# Patient Record
Sex: Male | Born: 1965
Health system: Southern US, Community
[De-identification: ages and names within clinical notes are randomized; demographics above are authoritative.]

## PROBLEM LIST (undated history)

## (undated) DIAGNOSIS — E119 Type 2 diabetes mellitus without complications: Secondary | ICD-10-CM

## (undated) DIAGNOSIS — E785 Hyperlipidemia, unspecified: Secondary | ICD-10-CM

## (undated) DIAGNOSIS — N183 Chronic kidney disease, stage 3 unspecified: Secondary | ICD-10-CM

## (undated) DIAGNOSIS — I5022 Chronic systolic (congestive) heart failure: Secondary | ICD-10-CM

## (undated) DIAGNOSIS — I2510 Atherosclerotic heart disease of native coronary artery without angina pectoris: Secondary | ICD-10-CM

## (undated) DIAGNOSIS — I119 Hypertensive heart disease without heart failure: Secondary | ICD-10-CM

## (undated) DIAGNOSIS — I255 Ischemic cardiomyopathy: Secondary | ICD-10-CM

## (undated) HISTORY — PX: HEART TRANSPLANT: SHX268

## (undated) HISTORY — DX: Hyperlipidemia, unspecified: E78.5

## (undated) HISTORY — PX: CARDIAC CATHETERIZATION: SHX172

## (undated) HISTORY — PX: PACEMAKER GENERATOR CHANGE: SHX5998

## (undated) HISTORY — PX: INSERT / REPLACE / REMOVE PACEMAKER: SUR710

## (undated) HISTORY — PX: NEPHRECTOMY TRANSPLANTED ORGAN: SUR880

## (undated) HISTORY — PX: OTHER SURGICAL HISTORY: SHX169

---

## 2014-01-17 DIAGNOSIS — Z23 Encounter for immunization: Secondary | ICD-10-CM | POA: Diagnosis not present

## 2014-01-17 DIAGNOSIS — I10 Essential (primary) hypertension: Secondary | ICD-10-CM | POA: Diagnosis not present

## 2014-01-17 DIAGNOSIS — I2510 Atherosclerotic heart disease of native coronary artery without angina pectoris: Secondary | ICD-10-CM | POA: Diagnosis not present

## 2014-01-17 DIAGNOSIS — M109 Gout, unspecified: Secondary | ICD-10-CM | POA: Diagnosis not present

## 2014-01-17 DIAGNOSIS — E785 Hyperlipidemia, unspecified: Secondary | ICD-10-CM | POA: Diagnosis not present

## 2014-02-17 DIAGNOSIS — R0600 Dyspnea, unspecified: Secondary | ICD-10-CM | POA: Diagnosis not present

## 2014-02-17 DIAGNOSIS — I10 Essential (primary) hypertension: Secondary | ICD-10-CM | POA: Diagnosis not present

## 2014-02-17 DIAGNOSIS — E785 Hyperlipidemia, unspecified: Secondary | ICD-10-CM | POA: Diagnosis not present

## 2014-02-17 DIAGNOSIS — I2510 Atherosclerotic heart disease of native coronary artery without angina pectoris: Secondary | ICD-10-CM | POA: Diagnosis not present

## 2014-02-21 DIAGNOSIS — R079 Chest pain, unspecified: Secondary | ICD-10-CM | POA: Diagnosis not present

## 2014-02-21 DIAGNOSIS — I517 Cardiomegaly: Secondary | ICD-10-CM | POA: Diagnosis not present

## 2014-02-21 DIAGNOSIS — I2510 Atherosclerotic heart disease of native coronary artery without angina pectoris: Secondary | ICD-10-CM | POA: Diagnosis not present

## 2014-02-21 DIAGNOSIS — I10 Essential (primary) hypertension: Secondary | ICD-10-CM | POA: Diagnosis not present

## 2014-02-21 DIAGNOSIS — Z955 Presence of coronary angioplasty implant and graft: Secondary | ICD-10-CM | POA: Diagnosis not present

## 2014-03-04 DIAGNOSIS — J82 Pulmonary eosinophilia, not elsewhere classified: Secondary | ICD-10-CM | POA: Diagnosis not present

## 2014-03-04 DIAGNOSIS — Z955 Presence of coronary angioplasty implant and graft: Secondary | ICD-10-CM | POA: Diagnosis not present

## 2014-03-04 DIAGNOSIS — I2510 Atherosclerotic heart disease of native coronary artery without angina pectoris: Secondary | ICD-10-CM | POA: Diagnosis not present

## 2014-03-04 DIAGNOSIS — Z95810 Presence of automatic (implantable) cardiac defibrillator: Secondary | ICD-10-CM | POA: Diagnosis not present

## 2014-03-04 DIAGNOSIS — I25110 Atherosclerotic heart disease of native coronary artery with unstable angina pectoris: Secondary | ICD-10-CM | POA: Diagnosis not present

## 2014-03-04 DIAGNOSIS — I10 Essential (primary) hypertension: Secondary | ICD-10-CM | POA: Diagnosis not present

## 2014-03-04 DIAGNOSIS — N189 Chronic kidney disease, unspecified: Secondary | ICD-10-CM | POA: Diagnosis not present

## 2014-03-04 DIAGNOSIS — R079 Chest pain, unspecified: Secondary | ICD-10-CM | POA: Diagnosis not present

## 2014-03-04 DIAGNOSIS — I214 Non-ST elevation (NSTEMI) myocardial infarction: Secondary | ICD-10-CM | POA: Diagnosis not present

## 2014-03-04 DIAGNOSIS — I509 Heart failure, unspecified: Secondary | ICD-10-CM | POA: Diagnosis not present

## 2014-03-04 DIAGNOSIS — R778 Other specified abnormalities of plasma proteins: Secondary | ICD-10-CM | POA: Diagnosis not present

## 2014-03-04 DIAGNOSIS — I129 Hypertensive chronic kidney disease with stage 1 through stage 4 chronic kidney disease, or unspecified chronic kidney disease: Secondary | ICD-10-CM | POA: Diagnosis not present

## 2014-03-04 DIAGNOSIS — E785 Hyperlipidemia, unspecified: Secondary | ICD-10-CM | POA: Diagnosis not present

## 2014-03-04 DIAGNOSIS — I252 Old myocardial infarction: Secondary | ICD-10-CM | POA: Diagnosis not present

## 2014-03-07 DIAGNOSIS — I2510 Atherosclerotic heart disease of native coronary artery without angina pectoris: Secondary | ICD-10-CM | POA: Diagnosis not present

## 2014-03-07 DIAGNOSIS — N189 Chronic kidney disease, unspecified: Secondary | ICD-10-CM | POA: Diagnosis not present

## 2014-03-07 DIAGNOSIS — I10 Essential (primary) hypertension: Secondary | ICD-10-CM | POA: Diagnosis not present

## 2014-03-07 DIAGNOSIS — R0600 Dyspnea, unspecified: Secondary | ICD-10-CM | POA: Diagnosis not present

## 2014-04-02 DIAGNOSIS — N189 Chronic kidney disease, unspecified: Secondary | ICD-10-CM | POA: Diagnosis not present

## 2014-04-02 DIAGNOSIS — N179 Acute kidney failure, unspecified: Secondary | ICD-10-CM | POA: Diagnosis not present

## 2014-04-02 DIAGNOSIS — I130 Hypertensive heart and chronic kidney disease with heart failure and stage 1 through stage 4 chronic kidney disease, or unspecified chronic kidney disease: Secondary | ICD-10-CM | POA: Diagnosis not present

## 2014-04-02 DIAGNOSIS — I5023 Acute on chronic systolic (congestive) heart failure: Secondary | ICD-10-CM | POA: Diagnosis not present

## 2014-04-03 DIAGNOSIS — E785 Hyperlipidemia, unspecified: Secondary | ICD-10-CM | POA: Diagnosis present

## 2014-04-03 DIAGNOSIS — S50312A Abrasion of left elbow, initial encounter: Secondary | ICD-10-CM | POA: Diagnosis not present

## 2014-04-03 DIAGNOSIS — I214 Non-ST elevation (NSTEMI) myocardial infarction: Secondary | ICD-10-CM | POA: Diagnosis not present

## 2014-04-03 DIAGNOSIS — Y9289 Other specified places as the place of occurrence of the external cause: Secondary | ICD-10-CM | POA: Diagnosis not present

## 2014-04-03 DIAGNOSIS — X58XXXA Exposure to other specified factors, initial encounter: Secondary | ICD-10-CM | POA: Diagnosis not present

## 2014-04-03 DIAGNOSIS — I5023 Acute on chronic systolic (congestive) heart failure: Secondary | ICD-10-CM | POA: Diagnosis not present

## 2014-04-03 DIAGNOSIS — Y9389 Activity, other specified: Secondary | ICD-10-CM | POA: Diagnosis not present

## 2014-04-03 DIAGNOSIS — N189 Chronic kidney disease, unspecified: Secondary | ICD-10-CM | POA: Diagnosis not present

## 2014-04-03 DIAGNOSIS — N179 Acute kidney failure, unspecified: Secondary | ICD-10-CM | POA: Diagnosis not present

## 2014-04-03 DIAGNOSIS — Z955 Presence of coronary angioplasty implant and graft: Secondary | ICD-10-CM | POA: Diagnosis not present

## 2014-04-03 DIAGNOSIS — N289 Disorder of kidney and ureter, unspecified: Secondary | ICD-10-CM | POA: Diagnosis not present

## 2014-04-03 DIAGNOSIS — I2510 Atherosclerotic heart disease of native coronary artery without angina pectoris: Secondary | ICD-10-CM | POA: Diagnosis not present

## 2014-04-03 DIAGNOSIS — I509 Heart failure, unspecified: Secondary | ICD-10-CM | POA: Diagnosis not present

## 2014-04-03 DIAGNOSIS — S0990XA Unspecified injury of head, initial encounter: Secondary | ICD-10-CM | POA: Diagnosis not present

## 2014-04-03 DIAGNOSIS — Z09 Encounter for follow-up examination after completed treatment for conditions other than malignant neoplasm: Secondary | ICD-10-CM | POA: Diagnosis not present

## 2014-04-03 DIAGNOSIS — N182 Chronic kidney disease, stage 2 (mild): Secondary | ICD-10-CM | POA: Diagnosis not present

## 2014-04-03 DIAGNOSIS — S59902A Unspecified injury of left elbow, initial encounter: Secondary | ICD-10-CM | POA: Diagnosis not present

## 2014-04-03 DIAGNOSIS — N281 Cyst of kidney, acquired: Secondary | ICD-10-CM | POA: Diagnosis not present

## 2014-04-03 DIAGNOSIS — R079 Chest pain, unspecified: Secondary | ICD-10-CM | POA: Diagnosis not present

## 2014-04-03 DIAGNOSIS — Z95810 Presence of automatic (implantable) cardiac defibrillator: Secondary | ICD-10-CM | POA: Diagnosis not present

## 2014-04-03 DIAGNOSIS — R55 Syncope and collapse: Secondary | ICD-10-CM | POA: Diagnosis not present

## 2014-04-03 DIAGNOSIS — I130 Hypertensive heart and chronic kidney disease with heart failure and stage 1 through stage 4 chronic kidney disease, or unspecified chronic kidney disease: Secondary | ICD-10-CM | POA: Diagnosis not present

## 2014-04-03 DIAGNOSIS — I255 Ischemic cardiomyopathy: Secondary | ICD-10-CM | POA: Diagnosis not present

## 2014-04-05 DIAGNOSIS — N281 Cyst of kidney, acquired: Secondary | ICD-10-CM | POA: Diagnosis not present

## 2014-04-05 DIAGNOSIS — N289 Disorder of kidney and ureter, unspecified: Secondary | ICD-10-CM | POA: Diagnosis not present

## 2014-04-14 DIAGNOSIS — I5022 Chronic systolic (congestive) heart failure: Secondary | ICD-10-CM | POA: Diagnosis not present

## 2014-05-20 DIAGNOSIS — I340 Nonrheumatic mitral (valve) insufficiency: Secondary | ICD-10-CM | POA: Diagnosis present

## 2014-05-20 DIAGNOSIS — E785 Hyperlipidemia, unspecified: Secondary | ICD-10-CM | POA: Diagnosis present

## 2014-05-20 DIAGNOSIS — I130 Hypertensive heart and chronic kidney disease with heart failure and stage 1 through stage 4 chronic kidney disease, or unspecified chronic kidney disease: Secondary | ICD-10-CM | POA: Diagnosis not present

## 2014-05-20 DIAGNOSIS — Z950 Presence of cardiac pacemaker: Secondary | ICD-10-CM | POA: Diagnosis not present

## 2014-05-20 DIAGNOSIS — I509 Heart failure, unspecified: Secondary | ICD-10-CM | POA: Diagnosis not present

## 2014-05-20 DIAGNOSIS — R0600 Dyspnea, unspecified: Secondary | ICD-10-CM | POA: Diagnosis not present

## 2014-05-20 DIAGNOSIS — I10 Essential (primary) hypertension: Secondary | ICD-10-CM | POA: Diagnosis not present

## 2014-05-20 DIAGNOSIS — I255 Ischemic cardiomyopathy: Secondary | ICD-10-CM | POA: Diagnosis present

## 2014-05-20 DIAGNOSIS — N179 Acute kidney failure, unspecified: Secondary | ICD-10-CM | POA: Diagnosis not present

## 2014-05-20 DIAGNOSIS — R079 Chest pain, unspecified: Secondary | ICD-10-CM | POA: Diagnosis not present

## 2014-05-20 DIAGNOSIS — M109 Gout, unspecified: Secondary | ICD-10-CM | POA: Diagnosis present

## 2014-05-20 DIAGNOSIS — Z95810 Presence of automatic (implantable) cardiac defibrillator: Secondary | ICD-10-CM | POA: Diagnosis not present

## 2014-05-20 DIAGNOSIS — I272 Other secondary pulmonary hypertension: Secondary | ICD-10-CM | POA: Diagnosis present

## 2014-05-20 DIAGNOSIS — I5023 Acute on chronic systolic (congestive) heart failure: Secondary | ICD-10-CM | POA: Diagnosis not present

## 2014-05-20 DIAGNOSIS — I2510 Atherosclerotic heart disease of native coronary artery without angina pectoris: Secondary | ICD-10-CM | POA: Diagnosis not present

## 2014-05-20 DIAGNOSIS — N189 Chronic kidney disease, unspecified: Secondary | ICD-10-CM | POA: Diagnosis not present

## 2014-05-20 DIAGNOSIS — Z955 Presence of coronary angioplasty implant and graft: Secondary | ICD-10-CM | POA: Diagnosis not present

## 2014-05-20 DIAGNOSIS — K219 Gastro-esophageal reflux disease without esophagitis: Secondary | ICD-10-CM | POA: Diagnosis present

## 2014-05-28 DIAGNOSIS — I5022 Chronic systolic (congestive) heart failure: Secondary | ICD-10-CM | POA: Diagnosis not present

## 2014-05-28 DIAGNOSIS — I259 Chronic ischemic heart disease, unspecified: Secondary | ICD-10-CM | POA: Diagnosis not present

## 2014-06-04 DIAGNOSIS — I5022 Chronic systolic (congestive) heart failure: Secondary | ICD-10-CM | POA: Diagnosis not present

## 2014-06-18 DIAGNOSIS — I5020 Unspecified systolic (congestive) heart failure: Secondary | ICD-10-CM | POA: Diagnosis not present

## 2014-06-18 DIAGNOSIS — I5022 Chronic systolic (congestive) heart failure: Secondary | ICD-10-CM | POA: Diagnosis not present

## 2014-07-02 DIAGNOSIS — Z95810 Presence of automatic (implantable) cardiac defibrillator: Secondary | ICD-10-CM | POA: Diagnosis not present

## 2014-07-06 DIAGNOSIS — I5022 Chronic systolic (congestive) heart failure: Secondary | ICD-10-CM | POA: Diagnosis not present

## 2014-07-22 DIAGNOSIS — I5022 Chronic systolic (congestive) heart failure: Secondary | ICD-10-CM | POA: Diagnosis not present

## 2014-07-25 DIAGNOSIS — N189 Chronic kidney disease, unspecified: Secondary | ICD-10-CM | POA: Diagnosis not present

## 2014-07-25 DIAGNOSIS — I10 Essential (primary) hypertension: Secondary | ICD-10-CM | POA: Diagnosis not present

## 2014-07-25 DIAGNOSIS — E785 Hyperlipidemia, unspecified: Secondary | ICD-10-CM | POA: Diagnosis not present

## 2014-07-25 DIAGNOSIS — I2510 Atherosclerotic heart disease of native coronary artery without angina pectoris: Secondary | ICD-10-CM | POA: Diagnosis not present

## 2014-08-11 DIAGNOSIS — I5022 Chronic systolic (congestive) heart failure: Secondary | ICD-10-CM | POA: Diagnosis not present

## 2014-09-17 DIAGNOSIS — E559 Vitamin D deficiency, unspecified: Secondary | ICD-10-CM | POA: Diagnosis not present

## 2014-09-17 DIAGNOSIS — E119 Type 2 diabetes mellitus without complications: Secondary | ICD-10-CM | POA: Diagnosis not present

## 2014-09-17 DIAGNOSIS — E785 Hyperlipidemia, unspecified: Secondary | ICD-10-CM | POA: Diagnosis not present

## 2014-09-17 DIAGNOSIS — N183 Chronic kidney disease, stage 3 (moderate): Secondary | ICD-10-CM | POA: Diagnosis not present

## 2014-09-17 DIAGNOSIS — N2581 Secondary hyperparathyroidism of renal origin: Secondary | ICD-10-CM | POA: Diagnosis not present

## 2014-09-17 DIAGNOSIS — D509 Iron deficiency anemia, unspecified: Secondary | ICD-10-CM | POA: Diagnosis not present

## 2014-09-17 DIAGNOSIS — D631 Anemia in chronic kidney disease: Secondary | ICD-10-CM | POA: Diagnosis not present

## 2014-09-17 DIAGNOSIS — M1030 Gout due to renal impairment, unspecified site: Secondary | ICD-10-CM | POA: Diagnosis not present

## 2014-09-22 DIAGNOSIS — I10 Essential (primary) hypertension: Secondary | ICD-10-CM | POA: Diagnosis not present

## 2014-09-22 DIAGNOSIS — N184 Chronic kidney disease, stage 4 (severe): Secondary | ICD-10-CM | POA: Diagnosis not present

## 2014-09-24 DIAGNOSIS — I5022 Chronic systolic (congestive) heart failure: Secondary | ICD-10-CM | POA: Diagnosis not present

## 2014-10-01 DIAGNOSIS — Z95810 Presence of automatic (implantable) cardiac defibrillator: Secondary | ICD-10-CM | POA: Diagnosis not present

## 2014-10-28 DIAGNOSIS — I5022 Chronic systolic (congestive) heart failure: Secondary | ICD-10-CM | POA: Diagnosis not present

## 2014-12-11 ENCOUNTER — Ambulatory Visit (INDEPENDENT_AMBULATORY_CARE_PROVIDER_SITE_OTHER): Payer: Medicare Other | Admitting: Internal Medicine

## 2014-12-11 VITALS — BP 120/90 | HR 98 | Temp 98.3°F | Resp 16 | Ht 68.0 in | Wt 152.0 lb

## 2014-12-11 DIAGNOSIS — E785 Hyperlipidemia, unspecified: Secondary | ICD-10-CM

## 2014-12-11 DIAGNOSIS — M1A9XX0 Chronic gout, unspecified, without tophus (tophi): Secondary | ICD-10-CM | POA: Diagnosis not present

## 2014-12-11 DIAGNOSIS — I5042 Chronic combined systolic (congestive) and diastolic (congestive) heart failure: Secondary | ICD-10-CM

## 2014-12-11 DIAGNOSIS — I10 Essential (primary) hypertension: Secondary | ICD-10-CM | POA: Diagnosis not present

## 2014-12-11 DIAGNOSIS — I509 Heart failure, unspecified: Secondary | ICD-10-CM | POA: Insufficient documentation

## 2014-12-11 NOTE — Progress Notes (Signed)
Subjective:  This chart was scribed for Marvin Lin, MD by Moises Blood, Medical Scribe. This patient was seen in Room 13 and the patient's care was started at 10:18 AM.    Patient ID: Marvin Martin, male    DOB: 12-03-1965, 49 y.o.   MRN: [id number] Chief Complaint  Patient presents with  . paperwork    pt. needs paper work filled out to get a handicap parking tag  . Referral    to cardiologist    HPI Marvin Martin is a 49 y.o. male who presents to [facility] for paperwork for new handicapped parking tag. He recently moved down here from [place]. He wanted to get the paperwork done before he goes to [facility] to change his license.   His reason for disability is marked cardiovascular dysfunction. Status post MI 4. 17 stents. Congestive heart failure with 30% of ventricular function remaining. Old records are not accessible but details his story well. He is sedentary at best. Easy fatigability. Dyspnea on exertion. No peripheral edema. He moved to get away from the intensity of living in [place].  He wants to be referred to local cardiologist and internist for primary care. He has difficult to control HTN. On medication for gout prevention. Hyperlipidemia. Reflux.  His past medical records are on a "chip" which I did not reviewed.  Current outpatient prescriptions:  .  allopurinol (ZYLOPRIM) 100 MG tablet, Take 100 mg by mouth daily., Disp: , Rfl:  .  AMLODIPINE BESYLATE PO, Take by mouth., Disp: , Rfl:  .  aspirin 81 MG tablet, Take 81 mg by mouth daily., Disp: , Rfl:  .  calcitRIOL (ROCALTROL) 0.25 MCG capsule, Take 0.25 mcg by mouth daily., Disp: , Rfl:  .  carvedilol (COREG) 25 MG tablet, Take 25 mg by mouth 2 (two) times daily with a meal., Disp: , Rfl:  .  cholecalciferol (VITAMIN D) 1000 UNITS tablet, Take 1,000 Units by mouth daily., Disp: , Rfl:  .  cloNIDine (CATAPRES) 0.1 MG tablet, Take 0.1 mg by mouth 2 (two) times daily., Disp: , Rfl:  .  ezetimibe (ZETIA) 10 MG  tablet, Take 10 mg by mouth daily., Disp: , Rfl:  .  furosemide (LASIX) 40 MG tablet, Take 40 mg by mouth., Disp: , Rfl:  .  hydrALAZINE (APRESOLINE) 25 MG tablet, Take 25 mg by mouth 3 (three) times daily., Disp: , Rfl:  .  isosorbide dinitrate (ISORDIL) 20 MG tablet, Take 20 mg by mouth 3 (three) times daily., Disp: , Rfl:  .  isosorbide mononitrate (IMDUR) 30 MG 24 hr tablet, Take 30 mg by mouth daily., Disp: , Rfl:  .  nitroGLYCERIN (NITROSTAT) 0.4 MG SL tablet, Place 0.4 mg under the tongue every 5 (five) minutes as needed for chest pain., Disp: , Rfl:  .  omega-3 acid ethyl esters (LOVAZA) 1 G capsule, Take by mouth 2 (two) times daily., Disp: , Rfl:  .  ondansetron (ZOFRAN) 4 MG tablet, Take 4 mg by mouth every 8 (eight) hours as needed for nausea or vomiting., Disp: , Rfl:  .  PANTOPRAZOLE SODIUM PO, Take 40 mg by mouth., Disp: , Rfl:  .  potassium chloride (K-DUR,KLOR-CON) 10 MEQ tablet, Take 10 mEq by mouth 2 (two) times daily., Disp: , Rfl:  .  rosuvastatin (CRESTOR) 40 MG tablet, Take 40 mg by mouth daily., Disp: , Rfl:  .  sacubitril-valsartan (ENTRESTO) 49-51 MG, Take 1 tablet by mouth 2 (two) times daily., Disp: , Rfl:  .  spironolactone (ALDACTONE) 25 MG tablet, Take 25 mg by mouth daily., Disp: , Rfl:  .  ticagrelor (BRILINTA) 90 MG TABS tablet, Take by mouth 2 (two) times daily., Disp: , Rfl:   Review of Systems  Constitutional: Negative for fatigue and unexpected weight change.  Eyes: Negative for visual disturbance.  Respiratory: Negative for cough, chest tightness and shortness of breath.   Cardiovascular: Negative for chest pain, palpitations and leg swelling.  Gastrointestinal: Negative for abdominal pain and blood in stool.  Neurological: Negative for dizziness, light-headedness and headaches.       Objective:   Physical Exam  Constitutional: He is oriented to person, place, and time. He appears well-developed and well-nourished. No distress.  HENT:  Head:  Normocephalic and atraumatic.  Eyes: EOM are normal. Pupils are equal, round, and reactive to light.  Neck: Neck supple.  Cardiovascular: Normal rate.   Pulmonary/Chest: Effort normal. No respiratory distress.  Musculoskeletal: Normal range of motion.  Neurological: He is alert and oriented to person, place, and time.  Skin: Skin is warm and dry.  Psychiatric: He has a normal mood and affect. His behavior is normal.  Nursing note and vitals reviewed.   BP 120/90 mmHg  Pulse 98  Temp(Src) 98.3 F (36.8 C) (Oral)  Resp 16  Ht 5\' 8"  (1.727 m)  Wt 152 lb (68.947 kg)  BMI 23.12 kg/m2  SpO2 99%       Assessment & Plan:  Chronic combined systolic and diastolic congestive heart failure ([place]) - Plan: Ambulatory referral to Cardiology, Ambulatory referral to Internal Medicine  Essential hypertension - Plan: Ambulatory referral to Internal Medicine  Hyperlipidemia - Plan: Ambulatory referral to Internal Medicine  Chronic gout without tophus, unspecified cause, unspecified site - Plan: Ambulatory referral to Internal Medicine   no meds were needed today   handicap sticker arranged    By signing my name below, I, Moises Blood, attest that this documentation has been prepared under the direction and in the presence of Marvin Lin, MD. Electronically Signed: Moises Blood, [place]. 12/11/2014 , 10:18 AM .  I have completed the patient encounter in its entirety as documented by the scribe, with editing by me where necessary. Robert P. Laney Pastor, M.D.

## 2014-12-21 ENCOUNTER — Telehealth (HOSPITAL_COMMUNITY): Payer: Self-pay | Admitting: Vascular Surgery

## 2014-12-21 NOTE — Telephone Encounter (Signed)
Left pt message to make new pt appt 

## 2015-01-07 ENCOUNTER — Ambulatory Visit (HOSPITAL_COMMUNITY)
Admission: RE | Admit: 2015-01-07 | Discharge: 2015-01-07 | Disposition: A | Discharge status: Home / Self Care | Payer: Medicare Other | Source: Ambulatory Visit | Attending: Internal Medicine | Admitting: Internal Medicine

## 2015-01-07 ENCOUNTER — Encounter (HOSPITAL_COMMUNITY): Payer: Self-pay | Admitting: Internal Medicine

## 2015-01-07 VITALS — BP 159/103 | HR 82 | Ht 67.0 in | Wt 150.1 lb

## 2015-01-07 DIAGNOSIS — I5042 Chronic combined systolic (congestive) and diastolic (congestive) heart failure: Secondary | ICD-10-CM | POA: Diagnosis not present

## 2015-01-07 DIAGNOSIS — I2510 Atherosclerotic heart disease of native coronary artery without angina pectoris: Secondary | ICD-10-CM | POA: Diagnosis not present

## 2015-01-07 DIAGNOSIS — I5022 Chronic systolic (congestive) heart failure: Secondary | ICD-10-CM | POA: Insufficient documentation

## 2015-01-07 DIAGNOSIS — I110 Hypertensive heart disease with heart failure: Secondary | ICD-10-CM | POA: Insufficient documentation

## 2015-01-07 DIAGNOSIS — E785 Hyperlipidemia, unspecified: Secondary | ICD-10-CM | POA: Diagnosis not present

## 2015-01-07 DIAGNOSIS — I10 Essential (primary) hypertension: Secondary | ICD-10-CM | POA: Diagnosis not present

## 2015-01-07 HISTORY — DX: Atherosclerotic heart disease of native coronary artery without angina pectoris: I25.10

## 2015-01-07 LAB — BASIC METABOLIC PANEL
Anion gap: 8 (ref 5–15)
BUN: 23 mg/dL — AB (ref 6–20)
CHLORIDE: 111 mmol/L (ref 101–111)
CO2: 25 mmol/L (ref 22–32)
Calcium: 9.4 mg/dL (ref 8.9–10.3)
Creatinine, Ser: 1.71 mg/dL — ABNORMAL HIGH (ref 0.61–1.24)
GFR calc Af Amer: 52 mL/min — ABNORMAL LOW (ref >60)
GFR calc non Af Amer: 45 mL/min — ABNORMAL LOW (ref >60)
GLUCOSE: 138 mg/dL — AB (ref 65–99)
POTASSIUM: 4 mmol/L (ref 3.5–5.1)
Sodium: 144 mmol/L (ref 135–145)

## 2015-01-07 LAB — CBC
HEMATOCRIT: 41 % (ref 39.0–52.0)
Hemoglobin: 13.2 g/dL (ref 13.0–17.0)
MCH: 34.6 pg — ABNORMAL HIGH (ref 26.0–34.0)
MCHC: 32.2 g/dL (ref 30.0–36.0)
MCV: 107.3 fL — AB (ref 78.0–100.0)
Platelets: 214 10*3/uL (ref 150–400)
RBC: 3.82 MIL/uL — ABNORMAL LOW (ref 4.22–5.81)
RDW: 13.2 % (ref 11.5–15.5)
WBC: 3.9 10*3/uL — ABNORMAL LOW (ref 4.0–10.5)

## 2015-01-07 MED ORDER — HYDRALAZINE HCL 50 MG PO TABS: 50.0000 mg | ORAL_TABLET | Freq: Three times a day (TID) | ORAL | Status: DC

## 2015-01-07 MED ORDER — ISOSORBIDE DINITRATE 20 MG PO TABS
20.0000 mg | ORAL_TABLET | Freq: Three times a day (TID) | ORAL | Status: DC
Start: 2015-01-07 — End: 2015-04-26

## 2015-01-07 NOTE — Progress Notes (Signed)
Advanced Heart Failure Medication Review by a Pharmacist  Does the patient  feel that his/her medications are working for him/her?  yes  Has the patient been experiencing any side effects to the medications prescribed?  no  Does the patient measure his/her own blood pressure or blood glucose at home?  yes   Does the patient have any problems obtaining medications due to transportation or finances?   no  Understanding of regimen: good Understanding of indications: good Potential of compliance: good Patient understands to avoid NSAIDs. Patient understands to avoid decongestants.  Issues to address at subsequent visits: None   Pharmacist comments:  Mr. Latz is a pleasant 49 yo M who recently moved from [place] presenting with an up-to-date medication list. He reports excellent compliance with his regimen and did not have any specific medication-related questions or concerns for me at this time.   Ruta Hinds. Velva Harman, PharmD, BCPS, CPP Clinical Pharmacist Pager: [phone] Phone: [phone] 01/07/2015 11:47 AM      Time with patient: 8 minutes Preparation and documentation time: 2 minutes Total time: 10 minutes

## 2015-01-07 NOTE — Progress Notes (Signed)
Patient ID: Marvin Martin, male   DOB: [birth year], 49 y.o.   MRN: [id number]   ADVANCED HF CLINIC CONSULT NOTE  Referring Physician: Dr Marvin Martin Primary Care: Dr Marvin Martin Primary Cardiologist:  HPI: Marvin Martin is a 49 year old male with history of CAD, ICM, MI X4 (presents with chest burning radiating to L shoulder) with 17 stents, chronic systolic HF with EF [id number] s/p St. Jude ICD, HTN, gout, and hyperlipidemia.  He just relocated to [facility] from [place]. His medical care was provided by [facility]. He has struggled with in-stent restenosis. Most recent heart cath was February 2016 with stent placed. He is referred to HF clinic by Dr Marvin Martin to establish HF care.    Overall feeling ok. He has noticed increased dyspnea with exertion over the last week. Last week he developed nausea that improved with zofran.  Denies PND/Orthopnea. No significant CP. No edema, orthopnea or PND. Says he walks slowly around the grocery store. Not exercising. Follows low salt diet and limits fluid intake. Weight at home 148 pounds. Takes medications daily.   SH: Separated from his wife. Has 4 children. 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         Father deceased but he is unsure of the cause. He had hyperlipidemia.         Sister: Heart disease  [facility]  [address] [place] [address] [phone]   Review of Systems: [y] = yes, [ ]  = no   General: Weight gain [ ] ; Weight loss [ ] ; Anorexia [ ] ; Fatigue [ Y]; Fever [ ] ; Chills [ ] ; Weakness [ ]   Cardiac: Chest pain/pressure [ ] ; Resting SOB [ ] ; Exertional SOB [Y ]; Orthopnea [ ] ; Pedal Edema [ ] ; Palpitations [ ] ; Syncope [ ] ; Presyncope [ ] ; Paroxysmal nocturnal dyspnea[ ]   Pulmonary: Cough [ ] ; Wheezing[ ] ; Hemoptysis[ ] ; Sputum [ ] ; Snoring [ ]   GI: Vomiting[ ] ; Dysphagia[ ] ; Melena[ ] ; Hematochezia [ ] ; Heartburn[ ] ; Abdominal pain [ ] ; Constipation [ ] ; Diarrhea [ ] ; BRBPR [ ]     GU: Hematuria[ ] ; Dysuria [ ] ; Nocturia[ ]   Vascular: Pain in legs with walking [ ] ; Pain in feet with lying flat [ ] ; Non-healing sores [ ] ; Stroke [ ] ; TIA [ ] ; Slurred speech [ ] ;  Neuro: Headaches[ ] ; Vertigo[ ] ; Seizures[ ] ; Paresthesias[ ] ;Blurred vision [ ] ; Diplopia [ ] ; Vision changes [ ]   Ortho/Skin: Arthritis [ ] ; Joint pain [ ] ; Muscle pain [ ] ; Joint swelling [ ] ; Back Pain [ ] ; Rash [ ]   Psych: Depression[ ] ; Anxiety[ ]   Heme: Bleeding problems [ ] ; Clotting disorders [ ] ; Anemia [ ]   Endocrine: Diabetes [ ] ; Thyroid dysfunction[ ]    Past Medical History  Diagnosis Date  . CHF (congestive heart failure) ([place])   . Myocardial infarction ([place])   . Hyperlipidemia   . Coronary artery disease     17 stents   . Hypertension     Current Outpatient Prescriptions  Medication Sig Dispense Refill  . allopurinol (ZYLOPRIM) 100 MG tablet Take 100 mg by mouth 2 (two) times daily.     [name] amLODipine (NORVASC) 10 MG tablet Take 10 mg by mouth daily.    [name] aspirin 81 MG tablet Take 81 mg by mouth daily.    . calcitRIOL (ROCALTROL) 0.25 MCG capsule Take 0.25 mcg by mouth every other day. Take on Monday, Wednesday and Friday    .  carvedilol (COREG) 25 MG tablet Take 25 mg by mouth 2 (two) times daily with a meal.    . cholecalciferol (VITAMIN D) 1000 UNITS tablet Take 1,000 Units by mouth daily.    . cloNIDine (CATAPRES) 0.1 MG tablet Take 0.1 mg by mouth 2 (two) times daily.    [name] ezetimibe (ZETIA) 10 MG tablet Take 10 mg by mouth daily.    . furosemide (LASIX) 40 MG tablet Take 40 mg by mouth daily.     . hydrALAZINE (APRESOLINE) 25 MG tablet Take 25 mg by mouth 3 (three) times daily.    . isosorbide dinitrate (ISORDIL) 20 MG tablet Take 20 mg by mouth 2 (two) times daily.     . isosorbide mononitrate (IMDUR) 30 MG 24 hr tablet Take 30 mg by mouth daily.    [name] omega-3 acid ethyl esters (LOVAZA) 1 G capsule Take 1 g by mouth daily.     [name] PANTOPRAZOLE SODIUM PO Take 40 mg by mouth.    .  potassium chloride (K-DUR,KLOR-CON) 10 MEQ tablet Take 10 mEq by mouth 2 (two) times daily.    . rosuvastatin (CRESTOR) 40 MG tablet Take 40 mg by mouth daily.    . sacubitril-valsartan (ENTRESTO) 49-51 MG Take 1 tablet by mouth 2 (two) times daily.    [name] spironolactone (ALDACTONE) 25 MG tablet Take 25 mg by mouth daily.    . ticagrelor (BRILINTA) 90 MG TABS tablet Take by mouth 2 (two) times daily.    . nitroGLYCERIN (NITROSTAT) 0.4 MG SL tablet Place 0.4 mg under the tongue every 5 (five) minutes as needed for chest pain. Reported on 01/07/2015    . ondansetron (ZOFRAN) 4 MG tablet Take 4 mg by mouth every 8 (eight) hours as needed for nausea or vomiting. Reported on 01/07/2015     No current facility-administered medications for this encounter.    No Known Allergies    Social History   Social History  . Marital Status: Legally Separated    Spouse Name: N/A  . Number of Children: N/A  . Years of Education: N/A   Occupational History  . Not on file.   Social History Main Topics  . Smoking status: Never Smoker   . Smokeless tobacco: Not on file  . Alcohol Use: No  . Drug Use: No  . Sexual Activity: No   Other Topics Concern  . Not on file   Social History Narrative      Family History  Problem Relation Age of Onset  . Cancer Mother   . Hypertension Mother   . Hyperlipidemia Father     Danley Danker Vitals:   01/07/15 1109  BP: 159/103  Pulse: 82  Height: 5\' 7"  (1.702 m)  Weight: 150 lb 1.9 oz (68.094 kg)  SpO2: 99%    PHYSICAL EXAM: General:  Well appearing. No respiratory difficulty HEENT: normal Neck: supple. no JVD. Carotids 2+ bilat; no bruits. No lymphadenopathy or thryomegaly appreciated. Cor: PMI nondisplaced. Regular rate & rhythm. No rubs, gallops or murmurs. Lungs: clear Abdomen: soft, nontender, nondistended. No hepatosplenomegaly. No bruits or masses. Good bowel sounds. Extremities: no cyanosis, clubbing, rash, edema Neuro: alert & oriented x 3,  cranial nerves grossly intact. moves all 4 extremities w/o difficulty. Affect pleasant.  EKG: NSR 83 bpm   NSR  ASSESSMENT & PLAN:  1. Chronic Systolic Heart Failure - IC. F 30% by his report.  ST Jude ICD  NYHA II-III. Volume status stable. Continue lasix 40 mg daily. Continue spiro 25  mg daily. Continue carvedilol 25 mg twice a day.  Continue entresto 49-51 mg twice a day Increase hydralazine 50 mg tid. Stop Imdur Continue isordil 20 mg tid.  2. CAD- MI X4 17 stents Most recent [place] 2.2016. H/o recurrent ISR.  - Stable no evidence ongoing ischemia.  - Continue ASA, statin, ticagrelor and b-blocker - He qualifies for CR. Will discuss at next visit 3. HTN- Elevated. As above increase hydralazine  4. Hyperlipidemia - Continue statin  He will need ECHO and CPX test.  Follow up in 4 weeks  Amy Clegg NP-C 12:19 PM   Patient seen and examined with Darrick Grinder, NP. We discussed all aspects of the encounter. I agree with the assessment and plan as stated above.   He recently relocated from [place] to [place] due to stress. Reports extensive h/o CAD with multiple MIs, stents and ISR resulting in ischemic CM with EF 30%. Currently with NYHA II-III symptoms. On good medical regimen but BP remains high so will titrate hydralazine. Will get echo, CPX test and labs. If CPX shows good functional capacity can follow at [facility] as CAD major issue. If labs look ok can titrate Entresto.   Total time spent 45 minutes. Over half that time spent discussing above.   Zohar Laing,MD 10:23 PM

## 2015-01-07 NOTE — Patient Instructions (Signed)
Stop Imdur  Increase Isordil to Three times a day   Increase Hydralazine to 50 mg Three times a day, we have sent you in a prescription for 50 mg tablets  Labs today  Your physician has requested that you have an echocardiogram. Echocardiography is a painless test that uses sound waves to create images of your heart. It provides your doctor with information about the size and shape of your heart and how well your heart's chambers and valves are working. This procedure takes approximately one hour. There are no restrictions for this procedure.  Your physician has recommended that you have a cardiopulmonary stress test (CPX). CPX testing is a non-invasive measurement of heart and lung function. It replaces a traditional treadmill stress test. This type of test provides a tremendous amount of information that relates not only to your present condition but also for future outcomes. This test combines measurements of you ventilation, respiratory gas exchange in the lungs, electrocardiogram (EKG), blood pressure and physical response before, during, and following an exercise protocol.  Your physician recommends that you schedule a follow-up appointment in: 4 weeks

## 2015-01-14 ENCOUNTER — Ambulatory Visit (HOSPITAL_COMMUNITY): Payer: Medicare Other | Attending: Internal Medicine

## 2015-01-14 DIAGNOSIS — I5042 Chronic combined systolic (congestive) and diastolic (congestive) heart failure: Secondary | ICD-10-CM

## 2015-01-15 DIAGNOSIS — R0609 Other forms of dyspnea: Secondary | ICD-10-CM | POA: Diagnosis not present

## 2015-01-20 ENCOUNTER — Other Ambulatory Visit: Payer: Self-pay

## 2015-01-20 ENCOUNTER — Ambulatory Visit (HOSPITAL_COMMUNITY): Payer: Medicare Other | Attending: Cardiology

## 2015-01-20 DIAGNOSIS — I10 Essential (primary) hypertension: Secondary | ICD-10-CM | POA: Insufficient documentation

## 2015-01-20 DIAGNOSIS — I059 Rheumatic mitral valve disease, unspecified: Secondary | ICD-10-CM | POA: Diagnosis not present

## 2015-01-20 DIAGNOSIS — I517 Cardiomegaly: Secondary | ICD-10-CM | POA: Insufficient documentation

## 2015-01-20 DIAGNOSIS — I509 Heart failure, unspecified: Secondary | ICD-10-CM | POA: Diagnosis present

## 2015-01-20 DIAGNOSIS — E785 Hyperlipidemia, unspecified: Secondary | ICD-10-CM | POA: Insufficient documentation

## 2015-01-20 DIAGNOSIS — I358 Other nonrheumatic aortic valve disorders: Secondary | ICD-10-CM | POA: Diagnosis not present

## 2015-01-20 DIAGNOSIS — I5042 Chronic combined systolic (congestive) and diastolic (congestive) heart failure: Secondary | ICD-10-CM

## 2015-01-20 DIAGNOSIS — R29898 Other symptoms and signs involving the musculoskeletal system: Secondary | ICD-10-CM | POA: Diagnosis not present

## 2015-02-09 ENCOUNTER — Ambulatory Visit (HOSPITAL_COMMUNITY)
Admission: RE | Admit: 2015-02-09 | Discharge: 2015-02-09 | Disposition: A | Discharge status: Home / Self Care | Payer: Medicare Other | Source: Ambulatory Visit | Attending: Internal Medicine | Admitting: Internal Medicine

## 2015-02-09 ENCOUNTER — Encounter (HOSPITAL_COMMUNITY): Payer: Self-pay | Admitting: Internal Medicine

## 2015-02-09 VITALS — BP 144/92 | HR 82 | Wt 151.2 lb

## 2015-02-09 DIAGNOSIS — Z955 Presence of coronary angioplasty implant and graft: Secondary | ICD-10-CM | POA: Insufficient documentation

## 2015-02-09 DIAGNOSIS — Z95810 Presence of automatic (implantable) cardiac defibrillator: Secondary | ICD-10-CM | POA: Insufficient documentation

## 2015-02-09 DIAGNOSIS — I5022 Chronic systolic (congestive) heart failure: Secondary | ICD-10-CM | POA: Insufficient documentation

## 2015-02-09 DIAGNOSIS — Z7982 Long term (current) use of aspirin: Secondary | ICD-10-CM | POA: Diagnosis not present

## 2015-02-09 DIAGNOSIS — N183 Chronic kidney disease, stage 3 unspecified: Secondary | ICD-10-CM | POA: Insufficient documentation

## 2015-02-09 DIAGNOSIS — I130 Hypertensive heart and chronic kidney disease with heart failure and stage 1 through stage 4 chronic kidney disease, or unspecified chronic kidney disease: Secondary | ICD-10-CM | POA: Insufficient documentation

## 2015-02-09 DIAGNOSIS — I2510 Atherosclerotic heart disease of native coronary artery without angina pectoris: Secondary | ICD-10-CM | POA: Insufficient documentation

## 2015-02-09 DIAGNOSIS — R0789 Other chest pain: Secondary | ICD-10-CM

## 2015-02-09 DIAGNOSIS — Z8249 Family history of ischemic heart disease and other diseases of the circulatory system: Secondary | ICD-10-CM | POA: Diagnosis not present

## 2015-02-09 DIAGNOSIS — E785 Hyperlipidemia, unspecified: Secondary | ICD-10-CM | POA: Insufficient documentation

## 2015-02-09 DIAGNOSIS — I10 Essential (primary) hypertension: Secondary | ICD-10-CM | POA: Diagnosis not present

## 2015-02-09 DIAGNOSIS — Z79899 Other long term (current) drug therapy: Secondary | ICD-10-CM | POA: Diagnosis not present

## 2015-02-09 DIAGNOSIS — I252 Old myocardial infarction: Secondary | ICD-10-CM | POA: Insufficient documentation

## 2015-02-09 DIAGNOSIS — I255 Ischemic cardiomyopathy: Secondary | ICD-10-CM | POA: Diagnosis not present

## 2015-02-09 DIAGNOSIS — M109 Gout, unspecified: Secondary | ICD-10-CM | POA: Insufficient documentation

## 2015-02-09 MED ORDER — SACUBITRIL-VALSARTAN 97-103 MG PO TABS: 1.0000 | ORAL_TABLET | Freq: Two times a day (BID) | ORAL | Status: DC

## 2015-02-09 MED ORDER — CLONIDINE HCL 0.1 MG PO TABS: 0.1000 mg | ORAL_TABLET | Freq: Every day | ORAL | Status: DC

## 2015-02-09 NOTE — Progress Notes (Signed)
Patient ID: Marvin Martin, male   DOB: 1965/06/09, 50 y.o.   MRN: [id number]   ADVANCED HF CLINIC CONSULT NOTE  Referring Physician: Dr Marvin Martin Primary Care: Dr Marvin Martin Primary Cardiologist:  HPI: Marvin Martin is a 50 year old male with history of CAD, ICM, MI X4 (presents with chest burning radiating to L shoulder) with 17 stents, chronic systolic HF with EF A999333 s/p St. Jude ICD, HTN, gout, and hyperlipidemia.  He just relocated to [facility] from [place]. His medical care was provided by [facility]. He has struggled with in-stent restenosis. Most recent heart cath was February 2016 with stent placed. He was referred to HF clinic by Dr Marvin Martin to establish HF care.    Overall feeling ok. At last visit increased hydralazine to 50 TID.  Since we last saw him had echo and CPX test as below. EF 20-25% CPX with pVO2 20.3 and normal slope. Gets around ok but lately more SOB if he does too much. Last night had episode of chest burning and dyspnea at rest, Resolved spontaneously.  Denies PND/Orthopnea. No edema, orthopnea or PND. Says he walks slowly around the grocery store.  Weight at home 148 pounds. Takes medications daily.    Echo 01/30/15 EF 20-25% RV normal  CPX 01/15/15 FVC 2.67 (65%)    FEV1 2.18 (64%)     FEV1/FVC 81 (100%)     MVV 105 (73%)  Resting HR: 90 Peak HR: 128  (75% age predicted max HR) BP rest: 130/90 BP peak: 182/104 Peak VO2: 20.3 (56.3% predicted peak VO2) VE/VCO2 slope: 26.8 OUES: 1.52 Peak RER: 1.21 Ventilatory Threshold: 12.1 (33.5% predicted or measured peak VO2) Peak RR 39 Peak Ventilation: 46.1 VE/MVV: 43.9% PETCO2 at peak: 43 O2pulse: 11  (78.6% predicted O2pulse)  Labs:  12/16: K 4.0 Creatinine 1.7 Hgb 13.2 MCV 107 (denies ETOH)   SH: Separated from his wife. Has 4 children. 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         Father deceased but he is unsure of the cause. He had  hyperlipidemia.         Sister: Heart disease  [facility]  [address] [place] [address] [phone]   Past Medical History  Diagnosis Date  . CHF (congestive heart failure) ([place])   . Myocardial infarction ([place])   . Hyperlipidemia   . Coronary artery disease     17 stents   . Hypertension     Current Outpatient Prescriptions  Medication Sig Dispense Refill  . allopurinol (ZYLOPRIM) 100 MG tablet Take 100 mg by mouth 2 (two) times daily.     [name] amLODipine (NORVASC) 10 MG tablet Take 10 mg by mouth daily.    [name] aspirin 81 MG tablet Take 81 mg by mouth daily.    . calcitRIOL (ROCALTROL) 0.25 MCG capsule Take 0.25 mcg by mouth every other day. Take on Monday, Wednesday and Friday    . carvedilol (COREG) 25 MG tablet Take 25 mg by mouth 2 (two) times daily with a meal.    . cholecalciferol (VITAMIN D) 1000 UNITS tablet Take 1,000 Units by mouth daily.    . cloNIDine (CATAPRES) 0.1 MG tablet Take 0.1 mg by mouth 2 (two) times daily.    [name] ezetimibe (ZETIA) 10 MG tablet Take 10 mg by mouth daily.    . furosemide (LASIX) 40 MG tablet Take 40 mg by mouth daily.     . hydrALAZINE (APRESOLINE) 50  MG tablet Take 1 tablet (50 mg total) by mouth 3 (three) times daily. 90 tablet 3  . isosorbide dinitrate (ISORDIL) 20 MG tablet Take 1 tablet (20 mg total) by mouth 3 (three) times daily. 90 tablet 3  . omega-3 acid ethyl esters (LOVAZA) 1 G capsule Take 1 g by mouth daily.     . ondansetron (ZOFRAN) 4 MG tablet Take 4 mg by mouth every 8 (eight) hours as needed for nausea or vomiting. Reported on 01/07/2015    . PANTOPRAZOLE SODIUM PO Take 40 mg by mouth.    . potassium chloride (K-DUR,KLOR-CON) 10 MEQ tablet Take 10 mEq by mouth 2 (two) times daily.    . rosuvastatin (CRESTOR) 40 MG tablet Take 40 mg by mouth daily.    . sacubitril-valsartan (ENTRESTO) 49-51 MG Take 1 tablet by mouth 2 (two) times daily.    [name] spironolactone (ALDACTONE) 25 MG tablet Take 25 mg by mouth daily.      . ticagrelor (BRILINTA) 90 MG TABS tablet Take by mouth 2 (two) times daily.    . nitroGLYCERIN (NITROSTAT) 0.4 MG SL tablet Place 0.4 mg under the tongue every 5 (five) minutes as needed for chest pain. Reported on 02/09/2015     No current facility-administered medications for this encounter.    No Known Allergies    Social History   Social History  . Marital Status: Legally Separated    Spouse Name: N/A  . Number of Children: N/A  . Years of Education: N/A   Occupational History  . Not on file.   Social History Main Topics  . Smoking status: Never Smoker   . Smokeless tobacco: Not on file  . Alcohol Use: No  . Drug Use: No  . Sexual Activity: No   Other Topics Concern  . Not on file   Social History Narrative      Family History  Problem Relation Age of Onset  . Cancer Mother   . Hypertension Mother   . Hyperlipidemia Father     Danley Danker Vitals:   02/09/15 1017  BP: 144/92  Pulse: 82  Weight: 151 lb 4 oz (68.607 kg)  SpO2: 99%    PHYSICAL EXAM: General:  Well appearing. No respiratory difficulty HEENT: normal Neck: supple. no JVD. Carotids 2+ bilat; no bruits. No lymphadenopathy or thryomegaly appreciated. Cor: PMI laterally  displaced. Regular rate & rhythm. No rubs, gallops or murmurs. Lungs: clear Abdomen: soft, nontender, nondistended. No hepatosplenomegaly. No bruits or masses. Good bowel sounds. Extremities: no cyanosis, clubbing, rash, edema Neuro: alert & oriented x 3, cranial nerves grossly intact. moves all 4 extremities w/o difficulty. Affect pleasant.    ASSESSMENT & PLAN:  1. Chronic Systolic Heart Failure - due to iCM. EF 20-25% on echo 1/17. RV ok. S/p ST Jude ICD  -CPX test reviewed shows mild to moderate HF limitation with pVO2 20.3 and normal slope. I reviewed with him. - NYHA II-III. Volume status stable. Continue lasix 40 mg daily. Continue spiro 25 mg daily. -Continue carvedilol 25 mg twice a day.  -Increase entresto 97-103 mg  twice a day -Continue hydralazine 50 mg tid and isordil 20 mg tid.  -Refer to Cardiac rehab 2. CAD- MI X4 17 stents Most recent [place] 2.2016. H/o recurrent ISR.  - Recent episode of CP and SOB. No recurrence. Will get exercise Myoview.  - Continue ASA, statin, ticagrelor and b-blocker - He qualifies for CR. Will refer 3. HTN - Remains elevated. With low EF would like to  get off clonidine.  4. Hyperlipidemia - Continue statin 5. CKD stage III  -stable on recent labs  Armya Westerhoff,MD 11:21 AM

## 2015-02-09 NOTE — Patient Instructions (Signed)
Increase Entresto to 97/103 mg Twice daily   Decrease clonidine to 0.1 mg daily  Your physician has requested that you have en exercise stress myoview. For further information please visit [email or web address]. Please follow instruction sheet, as given.  We will contact you in 2 months to schedule your next appointment.

## 2015-02-09 NOTE — Addendum Note (Signed)
Encounter addended by: Scarlette Calico, RN on: 02/09/2015 11:43 AM<BR>     Documentation filed: Medications, Dx Association, Patient Instructions Section, Orders

## 2015-02-10 ENCOUNTER — Telehealth (HOSPITAL_COMMUNITY): Payer: Self-pay | Admitting: *Deleted

## 2015-02-10 NOTE — Telephone Encounter (Signed)
Patient given detailed instructions per Myocardial Perfusion Study Information Sheet for the test on 02/15/15 at 745. Patient notified to arrive 15 minutes early and that it is imperative to arrive on time for appointment to keep from having the test rescheduled.  If you need to cancel or reschedule your appointment, please call the office within 24 hours of your appointment. Failure to do so may result in a cancellation of your appointment, and a $50 no show fee. Patient verbalized understanding.Hubbard Robinson, RN

## 2015-02-15 ENCOUNTER — Ambulatory Visit (HOSPITAL_COMMUNITY): Payer: Medicare Other | Attending: Internal Medicine

## 2015-02-15 ENCOUNTER — Encounter: Payer: Self-pay | Admitting: Internal Medicine

## 2015-02-15 DIAGNOSIS — I10 Essential (primary) hypertension: Secondary | ICD-10-CM | POA: Insufficient documentation

## 2015-02-15 DIAGNOSIS — R002 Palpitations: Secondary | ICD-10-CM | POA: Insufficient documentation

## 2015-02-15 DIAGNOSIS — R0789 Other chest pain: Secondary | ICD-10-CM | POA: Insufficient documentation

## 2015-02-15 DIAGNOSIS — R9439 Abnormal result of other cardiovascular function study: Secondary | ICD-10-CM | POA: Diagnosis not present

## 2015-02-15 DIAGNOSIS — R0609 Other forms of dyspnea: Secondary | ICD-10-CM | POA: Insufficient documentation

## 2015-02-15 DIAGNOSIS — I517 Cardiomegaly: Secondary | ICD-10-CM | POA: Insufficient documentation

## 2015-02-15 LAB — MYOCARDIAL PERFUSION IMAGING
CHL CUP NUCLEAR SDS: 2
CHL CUP NUCLEAR SSS: 26
CSEPPHR: 108 {beats}/min
LHR: 0.24
LV sys vol: 242 mL
LVDIAVOL: 295 mL
Rest HR: 82 {beats}/min
SRS: 24
TID: 1.05

## 2015-02-15 MED ORDER — TECHNETIUM TC 99M SESTAMIBI GENERIC - CARDIOLITE
31.9000 | Freq: Once | INTRAVENOUS | Status: AC | PRN
  Administered 2015-02-15: 31.9 via INTRAVENOUS

## 2015-02-15 MED ORDER — REGADENOSON 0.4 MG/5ML IV SOLN
0.4000 mg | Freq: Once | INTRAVENOUS | Status: AC
  Administered 2015-02-15: 0.4 mg via INTRAVENOUS

## 2015-02-15 MED ORDER — TECHNETIUM TC 99M SESTAMIBI GENERIC - CARDIOLITE
10.4000 | Freq: Once | INTRAVENOUS | Status: AC | PRN
  Administered 2015-02-15: 10 via INTRAVENOUS

## 2015-02-16 ENCOUNTER — Telehealth (HOSPITAL_COMMUNITY): Payer: Self-pay

## 2015-02-16 NOTE — Telephone Encounter (Signed)
Patient would like to know when he will start Cardiac Rehab

## 2015-02-16 NOTE — Progress Notes (Signed)
Patient would like to know when he is going to start Cardiac Rehab.

## 2015-03-03 ENCOUNTER — Encounter: Payer: Self-pay | Admitting: Internal Medicine

## 2015-03-12 DIAGNOSIS — Z95810 Presence of automatic (implantable) cardiac defibrillator: Secondary | ICD-10-CM | POA: Diagnosis not present

## 2015-03-24 ENCOUNTER — Encounter: Payer: Self-pay | Admitting: Cardiology

## 2015-03-24 ENCOUNTER — Ambulatory Visit (INDEPENDENT_AMBULATORY_CARE_PROVIDER_SITE_OTHER): Payer: Medicare Other | Admitting: Internal Medicine

## 2015-03-24 ENCOUNTER — Encounter: Payer: Self-pay | Admitting: Internal Medicine

## 2015-03-24 VITALS — BP 162/122 | HR 86 | Ht 67.5 in | Wt 153.2 lb

## 2015-03-24 DIAGNOSIS — Z95810 Presence of automatic (implantable) cardiac defibrillator: Secondary | ICD-10-CM | POA: Diagnosis not present

## 2015-03-24 DIAGNOSIS — I5022 Chronic systolic (congestive) heart failure: Secondary | ICD-10-CM

## 2015-03-24 DIAGNOSIS — I2510 Atherosclerotic heart disease of native coronary artery without angina pectoris: Secondary | ICD-10-CM

## 2015-03-24 NOTE — Patient Instructions (Signed)
Medication Instructions: - Your physician recommends that you continue on your current medications as directed. Please refer to the Current Medication list given to you today.  Labwork: - none  Procedures/Testing: - none  Follow-Up: - Remote monitoring is used to monitor your Pacemaker of ICD from home. This monitoring reduces the number of office visits required to check your device to one time per year. It allows [place] to keep an eye on the functioning of your device to ensure it is working properly. You are scheduled for a device check from home on 06/23/15. You may send your transmission at any time that day. If you have a wireless device, the transmission will be sent automatically. After your physician reviews your transmission, you will receive a postcard with your next transmission date.  - Your physician wants you to follow-up in: November 2017 with Dr. Caryl Comes. You will receive a reminder letter in the mail two months in advance. If you don't receive a letter, please call our office to schedule the follow-up appointment.  Any Additional Special Instructions Will Be Listed Below (If Applicable).     If you need a refill on your cardiac medications before your next appointment, please call your pharmacy.

## 2015-03-24 NOTE — Progress Notes (Signed)
ELECTROPHYSIOLOGY CONSULT NOTE  Patient ID: Marvin Martin, MRN: [id number], DOB/AGE: [birth year] 50 y.o. Admit date: (Not on file) Date of Consult: 03/24/2015  Primary Physician: No PCP Per Patient Primary Cardiologist: DB Consulting Physician DB  Chief Complaint: ICD establish   HPI Marvin Martin is a 50 y.o. male  Seen to establish ICD follow-up.  He has a history of ischemic heart disease with prior MI 4 and stents 17. He has an ejection fraction of 30% and has been managed mostly in [place]. He has recently moved to [facility].  He has a St. Jude ICD implanted 2007 with generator replacement 2012 initially   for primary prevention. He has however had 4 shocks 3 on his initial device and one on this device.   he is unaware as to whether they were appropriate or inappropriate. He does recognize the term ventricular fibrillation as it applies to him.  His blood pressure is elevated this morning; he says he did not take all his medicines.   Echocardiogram 1/17 he has 20-25%  DATE TEST    1/17    myoview   EF 16 % Large scar without reversibility   1/17    echo   EF 20-25 %        Past Medical History  Diagnosis Date  . CHF (congestive heart failure) ([place])   . Myocardial infarction ([place])   . Hyperlipidemia   . Coronary artery disease     17 stents   . Hypertension       Surgical History:  Past Surgical History  Procedure Laterality Date  . [place] placements Bilateral   . Pacemaker generator change Bilateral   . Insert / replace / remove pacemaker      St jude 2006  Generator Change 2012   . Cardiac catheterization      Most recent 02/2014      Home Meds: Prior to Admission medications   Medication Sig Start Date End Date Taking? Authorizing Provider  allopurinol (ZYLOPRIM) 100 MG tablet Take 100 mg by mouth 2 (two) times daily.    Yes Historical Provider, MD  amLODipine (NORVASC) 10 MG tablet Take 10 mg by mouth daily.   Yes Historical  Provider, MD  aspirin 81 MG tablet Take 81 mg by mouth daily.   Yes Historical Provider, MD  calcitRIOL (ROCALTROL) 0.25 MCG capsule Take 0.25 mcg by mouth every other day. Take on Monday, Wednesday and Friday   Yes Historical Provider, MD  carvedilol (COREG) 25 MG tablet Take 25 mg by mouth 2 (two) times daily with a meal.   Yes Historical Provider, MD  cholecalciferol (VITAMIN D) 1000 UNITS tablet Take 1,000 Units by mouth daily.   Yes Historical Provider, MD  cloNIDine (CATAPRES) 0.1 MG tablet Take 1 tablet (0.1 mg total) by mouth daily. 02/09/15  Yes Jolaine Artist, MD  ezetimibe (ZETIA) 10 MG tablet Take 10 mg by mouth daily.   Yes Historical Provider, MD  furosemide (LASIX) 40 MG tablet Take 40 mg by mouth daily.    Yes Historical Provider, MD  hydrALAZINE (APRESOLINE) 50 MG tablet Take 1 tablet (50 mg total) by mouth 3 (three) times daily. 01/07/15  Yes Jolaine Artist, MD  isosorbide dinitrate (ISORDIL) 20 MG tablet Take 1 tablet (20 mg total) by mouth 3 (three) times daily. 01/07/15  Yes Jolaine Artist, MD  nitroGLYCERIN (NITROSTAT) 0.4 MG SL tablet Place 0.4 mg under the tongue every 5 (five) minutes  as needed for chest pain. Reported on 02/09/2015   Yes Historical Provider, MD  omega-3 acid ethyl esters (LOVAZA) 1 G capsule Take 1 g by mouth daily.    Yes Historical Provider, MD  ondansetron (ZOFRAN) 4 MG tablet Take 4 mg by mouth every 8 (eight) hours as needed for nausea or vomiting. Reported on 01/07/2015   Yes Historical Provider, MD  PANTOPRAZOLE SODIUM PO Take 40 mg by mouth.   Yes Historical Provider, MD  potassium chloride (K-DUR,KLOR-CON) 10 MEQ tablet Take 10 mEq by mouth 2 (two) times daily.   Yes Historical Provider, MD  rosuvastatin (CRESTOR) 40 MG tablet Take 40 mg by mouth daily.   Yes Historical Provider, MD  sacubitril-valsartan (ENTRESTO) 97-103 MG Take 1 tablet by mouth 2 (two) times daily. 02/09/15  Yes Jolaine Artist, MD  spironolactone (ALDACTONE) 25 MG  tablet Take 25 mg by mouth daily.   Yes Historical Provider, MD  ticagrelor (BRILINTA) 90 MG TABS tablet Take by mouth 2 (two) times daily.   Yes Historical Provider, MD    Allergies: No Known Allergies  Social History   Social History  . Marital Status: Legally Separated    Spouse Name: N/A  . Number of Children: N/A  . Years of Education: N/A   Occupational History  . Not on file.   Social History Main Topics  . Smoking status: Never Smoker   . Smokeless tobacco: Not on file  . Alcohol Use: No  . Drug Use: No  . Sexual Activity: No   Other Topics Concern  . Not on file   Social History Narrative     Family History  Problem Relation Age of Onset  . Cancer Mother   . Hypertension Mother   . Hyperlipidemia Father      ROS:  Please see the history of present illness.     All other systems reviewed and negative.    Physical Exam: Blood pressure 162/122, pulse 86, height 5' 7.5" (1.715 m), weight 153 lb 3.2 oz (69.491 kg). General: Well developed, well nourished male in no acute distress. Head: Normocephalic, atraumatic, sclera non-icteric, no xanthomas, nares are without discharge. EENT: normal  Lymph Nodes:  none Neck: Negative for carotid bruits. JVD not elevated. Back:without scoliosis kyphosis  Lungs: Clear bilaterally to auscultation without wheezes, rales, or rhonchi. Breathing is unlabored. Heart: RRR with S1 S2.  no murmur . No rubs, or gallops appreciated. Abdomen: Soft, non-tender, non-distended with normoactive bowel sounds. No hepatomegaly. No rebound/guarding. No obvious abdominal masses. Msk:  Strength and tone appear normal for age. Extremities: No clubbing or cyanosis. No  edema.  Distal pedal pulses are 2+ and equal bilaterally. Skin: Warm and Dry Neuro: Alert and oriented X 3. CN III-XII intact Grossly normal sensory and motor function . Psych:  Responds to questions appropriately with a normal affect.      Labs: Cardiac Enzymes No results  for input(s): CKTOTAL, CKMB, TROPONINI in the last 72 hours. CBC Lab Results  Component Value Date   WBC 3.9* 01/07/2015   HGB 13.2 01/07/2015   HCT 41.0 01/07/2015   MCV 107.3* 01/07/2015   PLT 214 01/07/2015   PROTIME: No results for input(s): LABPROT, INR in the last 72 hours. Chemistry No results for input(s): NA, K, CL, CO2, BUN, CREATININE, CALCIUM, PROT, BILITOT, ALKPHOS, ALT, AST, GLUCOSE in the last 168 hours.  Invalid input(s): LABALBU Lipids No results found for: CHOL, HDL, LDLCALC, TRIG BNP No results found for: PROBNP Thyroid Function  Tests: No results for input(s): TSH, T4TOTAL, T3FREE, THYROIDAB in the last 72 hours.  Invalid input(s): FREET3 Miscellaneous No results found for: DDIMER  Radiology/Studies:  No results found.  EKG:  Sinus with a narrow QRS   Assessment and Plan:  Ischemic cardiomyopathy  Implantable defibrillator-St. Jude  Renal insufficiency grade 3-4   The device function is normal.   BP elevated  He has had recurrent device therapy; however, we were unable to age as it is "locked" under the care of his previous cardiology service. We will be working on getting axis to that. For now, we will continue his current medications  I was going to increase his hydralazine based on his blood pressure recorded this morning; however, he told me he had not taken her blood pressure medications today.  Virl Axe

## 2015-03-31 ENCOUNTER — Encounter (HOSPITAL_COMMUNITY)
Admission: EM | Disposition: A | Discharge status: Home / Self Care | Payer: Self-pay | Source: Home / Self Care | Attending: Cardiovascular Disease

## 2015-03-31 ENCOUNTER — Telehealth: Payer: Self-pay | Admitting: *Deleted

## 2015-03-31 ENCOUNTER — Inpatient Hospital Stay (HOSPITAL_COMMUNITY)
Admission: EM | Admit: 2015-03-31 | Discharge: 2015-04-03 | DRG: 246 | Disposition: A | Discharge status: Home / Self Care | Payer: Medicare Other | Attending: Cardiovascular Disease | Admitting: Cardiovascular Disease

## 2015-03-31 ENCOUNTER — Encounter (HOSPITAL_COMMUNITY): Payer: Self-pay | Admitting: *Deleted

## 2015-03-31 ENCOUNTER — Emergency Department (HOSPITAL_COMMUNITY): Payer: Medicare Other

## 2015-03-31 DIAGNOSIS — Z79899 Other long term (current) drug therapy: Secondary | ICD-10-CM | POA: Diagnosis not present

## 2015-03-31 DIAGNOSIS — Z95810 Presence of automatic (implantable) cardiac defibrillator: Secondary | ICD-10-CM | POA: Diagnosis not present

## 2015-03-31 DIAGNOSIS — I119 Hypertensive heart disease without heart failure: Secondary | ICD-10-CM

## 2015-03-31 DIAGNOSIS — I209 Angina pectoris, unspecified: Secondary | ICD-10-CM | POA: Diagnosis not present

## 2015-03-31 DIAGNOSIS — Z955 Presence of coronary angioplasty implant and graft: Secondary | ICD-10-CM

## 2015-03-31 DIAGNOSIS — I213 ST elevation (STEMI) myocardial infarction of unspecified site: Secondary | ICD-10-CM | POA: Diagnosis not present

## 2015-03-31 DIAGNOSIS — I2510 Atherosclerotic heart disease of native coronary artery without angina pectoris: Secondary | ICD-10-CM | POA: Diagnosis present

## 2015-03-31 DIAGNOSIS — I2111 ST elevation (STEMI) myocardial infarction involving right coronary artery: Secondary | ICD-10-CM | POA: Diagnosis not present

## 2015-03-31 DIAGNOSIS — Z7982 Long term (current) use of aspirin: Secondary | ICD-10-CM | POA: Diagnosis not present

## 2015-03-31 DIAGNOSIS — I252 Old myocardial infarction: Secondary | ICD-10-CM | POA: Diagnosis not present

## 2015-03-31 DIAGNOSIS — E1169 Type 2 diabetes mellitus with other specified complication: Secondary | ICD-10-CM

## 2015-03-31 DIAGNOSIS — E785 Hyperlipidemia, unspecified: Secondary | ICD-10-CM | POA: Diagnosis present

## 2015-03-31 DIAGNOSIS — T82857A Stenosis of cardiac prosthetic devices, implants and grafts, initial encounter: Principal | ICD-10-CM | POA: Diagnosis present

## 2015-03-31 DIAGNOSIS — I130 Hypertensive heart and chronic kidney disease with heart failure and stage 1 through stage 4 chronic kidney disease, or unspecified chronic kidney disease: Secondary | ICD-10-CM | POA: Diagnosis present

## 2015-03-31 DIAGNOSIS — I2119 ST elevation (STEMI) myocardial infarction involving other coronary artery of inferior wall: Secondary | ICD-10-CM | POA: Diagnosis not present

## 2015-03-31 DIAGNOSIS — Z7902 Long term (current) use of antithrombotics/antiplatelets: Secondary | ICD-10-CM | POA: Diagnosis not present

## 2015-03-31 DIAGNOSIS — N183 Chronic kidney disease, stage 3 unspecified: Secondary | ICD-10-CM | POA: Diagnosis present

## 2015-03-31 DIAGNOSIS — I2109 ST elevation (STEMI) myocardial infarction involving other coronary artery of anterior wall: Secondary | ICD-10-CM | POA: Diagnosis present

## 2015-03-31 DIAGNOSIS — Z8249 Family history of ischemic heart disease and other diseases of the circulatory system: Secondary | ICD-10-CM | POA: Diagnosis not present

## 2015-03-31 DIAGNOSIS — I5022 Chronic systolic (congestive) heart failure: Secondary | ICD-10-CM | POA: Diagnosis present

## 2015-03-31 DIAGNOSIS — I255 Ischemic cardiomyopathy: Secondary | ICD-10-CM | POA: Diagnosis not present

## 2015-03-31 DIAGNOSIS — R0902 Hypoxemia: Secondary | ICD-10-CM | POA: Diagnosis present

## 2015-03-31 DIAGNOSIS — R079 Chest pain, unspecified: Secondary | ICD-10-CM | POA: Diagnosis not present

## 2015-03-31 HISTORY — DX: Chronic kidney disease, stage 3 (moderate): N18.3

## 2015-03-31 HISTORY — DX: Ischemic cardiomyopathy: I25.5

## 2015-03-31 HISTORY — DX: Chronic systolic (congestive) heart failure: I50.22

## 2015-03-31 HISTORY — PX: CARDIAC CATHETERIZATION: SHX172

## 2015-03-31 HISTORY — DX: Hypertensive heart disease without heart failure: I11.9

## 2015-03-31 HISTORY — DX: Chronic kidney disease, stage 3 unspecified: N18.30

## 2015-03-31 LAB — DIFFERENTIAL
Basophils Absolute: 0 10*3/uL (ref 0.0–0.1)
Basophils Relative: 0 %
Eosinophils Absolute: 0.1 10*3/uL (ref 0.0–0.7)
Eosinophils Relative: 1 %
LYMPHS PCT: 20 %
Lymphs Abs: 1.1 10*3/uL (ref 0.7–4.0)
Monocytes Absolute: 0.4 10*3/uL (ref 0.1–1.0)
Monocytes Relative: 8 %
NEUTROS ABS: 4.1 10*3/uL (ref 1.7–7.7)
NEUTROS PCT: 71 %

## 2015-03-31 LAB — BASIC METABOLIC PANEL
Anion gap: 13 (ref 5–15)
BUN: 17 mg/dL (ref 6–20)
CALCIUM: 9.5 mg/dL (ref 8.9–10.3)
CO2: 22 mmol/L (ref 22–32)
CREATININE: 2.03 mg/dL — AB (ref 0.61–1.24)
Chloride: 108 mmol/L (ref 101–111)
GFR calc non Af Amer: 37 mL/min — ABNORMAL LOW (ref >60)
GFR, EST AFRICAN AMERICAN: 42 mL/min — AB (ref >60)
Glucose, Bld: 179 mg/dL — ABNORMAL HIGH (ref 65–99)
Potassium: 4.2 mmol/L (ref 3.5–5.1)
SODIUM: 143 mmol/L (ref 135–145)

## 2015-03-31 LAB — CBC
HCT: 42.1 % (ref 39.0–52.0)
HCT: 37.4 % — ABNORMAL LOW (ref 39.0–52.0)
HEMOGLOBIN: 13.7 g/dL (ref 13.0–17.0)
HEMOGLOBIN: 12.2 g/dL — AB (ref 13.0–17.0)
MCH: 33.7 pg (ref 26.0–34.0)
MCH: 33.3 pg (ref 26.0–34.0)
MCHC: 32.5 g/dL (ref 30.0–36.0)
MCHC: 32.6 g/dL (ref 30.0–36.0)
MCV: 103.7 fL — AB (ref 78.0–100.0)
MCV: 102.2 fL — ABNORMAL HIGH (ref 78.0–100.0)
PLATELETS: 128 10*3/uL — AB (ref 150–400)
Platelets: 170 10*3/uL (ref 150–400)
RBC: 4.06 MIL/uL — AB (ref 4.22–5.81)
RBC: 3.66 MIL/uL — ABNORMAL LOW (ref 4.22–5.81)
RDW: 13.4 % (ref 11.5–15.5)
RDW: 13.3 % (ref 11.5–15.5)
WBC: 5.7 10*3/uL (ref 4.0–10.5)
WBC: 6.2 10*3/uL (ref 4.0–10.5)

## 2015-03-31 LAB — CREATININE, SERUM
CREATININE: 1.85 mg/dL — AB (ref 0.61–1.24)
GFR calc Af Amer: 47 mL/min — ABNORMAL LOW (ref >60)
GFR calc non Af Amer: 41 mL/min — ABNORMAL LOW (ref >60)

## 2015-03-31 LAB — I-STAT TROPONIN, ED: TROPONIN I, POC: 0.2 ng/mL — AB (ref 0.00–0.08)

## 2015-03-31 LAB — TROPONIN I: Troponin I: 0.22 ng/mL — ABNORMAL HIGH (ref <0.031)

## 2015-03-31 LAB — APTT: APTT: 23 s — AB (ref 24–37)

## 2015-03-31 LAB — PROTIME-INR
INR: 1.15 (ref 0.00–1.49)
PROTHROMBIN TIME: 14.9 s (ref 11.6–15.2)

## 2015-03-31 SURGERY — LEFT HEART CATH AND CORONARY ANGIOGRAPHY
Anesthesia: LOCAL

## 2015-03-31 MED ORDER — TICAGRELOR 90 MG PO TABS: 90.0000 mg | ORAL_TABLET | Freq: Two times a day (BID) | ORAL | Status: DC

## 2015-03-31 MED ORDER — FENTANYL CITRATE (PF) 100 MCG/2ML IJ SOLN
INTRAMUSCULAR | Status: AC
  Filled 2015-03-31: qty 2

## 2015-03-31 MED ORDER — HEPARIN SODIUM (PORCINE) 5000 UNIT/ML IJ SOLN
INTRAMUSCULAR | Status: AC
  Filled 2015-03-31: qty 1

## 2015-03-31 MED ORDER — LIDOCAINE HCL (PF) 1 % IJ SOLN
INTRAMUSCULAR | Status: AC
  Filled 2015-03-31: qty 30

## 2015-03-31 MED ORDER — ASPIRIN 81 MG PO TABS: 81.0000 mg | ORAL_TABLET | Freq: Every day | ORAL | Status: DC

## 2015-03-31 MED ORDER — EZETIMIBE 10 MG PO TABS
10.0000 mg | ORAL_TABLET | Freq: Every day | ORAL | Status: DC
  Administered 2015-04-01 – 2015-04-03 (×3): 10 mg via ORAL
  Filled 2015-03-31 (×3): qty 1

## 2015-03-31 MED ORDER — CARVEDILOL 25 MG PO TABS
25.0000 mg | ORAL_TABLET | Freq: Two times a day (BID) | ORAL | Status: DC
  Administered 2015-04-01 – 2015-04-03 (×6): 25 mg via ORAL
  Filled 2015-03-31 (×5): qty 1

## 2015-03-31 MED ORDER — SODIUM CHLORIDE 0.9% FLUSH: 3.0000 mL | INTRAVENOUS | Status: DC | PRN

## 2015-03-31 MED ORDER — ALLOPURINOL 100 MG PO TABS
100.0000 mg | ORAL_TABLET | Freq: Two times a day (BID) | ORAL | Status: DC
  Administered 2015-04-01 – 2015-04-03 (×5): 100 mg via ORAL
  Filled 2015-03-31 (×5): qty 1

## 2015-03-31 MED ORDER — CALCITRIOL 0.25 MCG PO CAPS
0.2500 ug | ORAL_CAPSULE | ORAL | Status: DC
  Administered 2015-04-02: 0.25 ug via ORAL
  Filled 2015-03-31 (×2): qty 1

## 2015-03-31 MED ORDER — ASPIRIN 81 MG PO CHEW
81.0000 mg | CHEWABLE_TABLET | Freq: Every day | ORAL | Status: DC
  Administered 2015-04-01 – 2015-04-03 (×3): 81 mg via ORAL
  Filled 2015-03-31 (×3): qty 1

## 2015-03-31 MED ORDER — SODIUM CHLORIDE 0.9 % IV SOLN
1.7500 mg/kg/h | INTRAVENOUS | Status: AC
  Filled 2015-03-31 (×2): qty 250

## 2015-03-31 MED ORDER — HEPARIN SODIUM (PORCINE) 5000 UNIT/ML IJ SOLN
5000.0000 [IU] | Freq: Three times a day (TID) | INTRAMUSCULAR | Status: DC
  Administered 2015-04-01 (×3): 5000 [IU] via SUBCUTANEOUS
  Filled 2015-03-31 (×3): qty 1

## 2015-03-31 MED ORDER — ROSUVASTATIN CALCIUM 10 MG PO TABS
40.0000 mg | ORAL_TABLET | Freq: Every day | ORAL | Status: DC
  Administered 2015-04-01 – 2015-04-03 (×3): 40 mg via ORAL
  Filled 2015-03-31 (×2): qty 2
  Filled 2015-03-31 (×2): qty 1
  Filled 2015-03-31: qty 4
  Filled 2015-03-31: qty 2

## 2015-03-31 MED ORDER — OMEGA-3-ACID ETHYL ESTERS 1 G PO CAPS
1.0000 g | ORAL_CAPSULE | Freq: Every day | ORAL | Status: DC
  Administered 2015-04-01 – 2015-04-03 (×3): 1 g via ORAL
  Filled 2015-03-31 (×3): qty 1

## 2015-03-31 MED ORDER — HEPARIN SODIUM (PORCINE) 5000 UNIT/ML IJ SOLN
4000.0000 [IU] | INTRAMUSCULAR | Status: AC
  Administered 2015-03-31: 4000 [IU] via INTRAVENOUS

## 2015-03-31 MED ORDER — ONDANSETRON HCL 4 MG PO TABS: 4.0000 mg | ORAL_TABLET | Freq: Three times a day (TID) | ORAL | Status: DC | PRN

## 2015-03-31 MED ORDER — MIDAZOLAM HCL 2 MG/2ML IJ SOLN
INTRAMUSCULAR | Status: DC | PRN
  Administered 2015-03-31: 2 mg via INTRAVENOUS
  Administered 2015-03-31: 1 mg via INTRAVENOUS

## 2015-03-31 MED ORDER — HYDRALAZINE HCL 50 MG PO TABS
50.0000 mg | ORAL_TABLET | Freq: Three times a day (TID) | ORAL | Status: DC
  Administered 2015-03-31 – 2015-04-03 (×8): 50 mg via ORAL
  Filled 2015-03-31 (×9): qty 1

## 2015-03-31 MED ORDER — MORPHINE SULFATE (PF) 4 MG/ML IV SOLN: 4.0000 mg | INTRAVENOUS | Status: DC | PRN

## 2015-03-31 MED ORDER — SODIUM CHLORIDE 0.9 % IV SOLN
INTRAVENOUS | Status: DC
  Administered 2015-03-31: 20:00:00 via INTRAVENOUS

## 2015-03-31 MED ORDER — MIDAZOLAM HCL 2 MG/2ML IJ SOLN
INTRAMUSCULAR | Status: AC
  Filled 2015-03-31: qty 2

## 2015-03-31 MED ORDER — SODIUM CHLORIDE 0.9 % IV SOLN: 250.0000 mL | INTRAVENOUS | Status: DC | PRN

## 2015-03-31 MED ORDER — FUROSEMIDE 10 MG/ML IJ SOLN
INTRAMUSCULAR | Status: AC
  Filled 2015-03-31: qty 4

## 2015-03-31 MED ORDER — AMLODIPINE BESYLATE 10 MG PO TABS
10.0000 mg | ORAL_TABLET | Freq: Every day | ORAL | Status: DC
  Administered 2015-04-01 – 2015-04-03 (×3): 10 mg via ORAL
  Filled 2015-03-31 (×3): qty 1

## 2015-03-31 MED ORDER — NITROGLYCERIN 0.4 MG SL SUBL
SUBLINGUAL_TABLET | SUBLINGUAL | Status: AC
  Filled 2015-03-31: qty 1

## 2015-03-31 MED ORDER — ONDANSETRON HCL 4 MG/2ML IJ SOLN: 4.0000 mg | Freq: Four times a day (QID) | INTRAMUSCULAR | Status: DC | PRN

## 2015-03-31 MED ORDER — FENTANYL CITRATE (PF) 100 MCG/2ML IJ SOLN
INTRAMUSCULAR | Status: DC | PRN
  Administered 2015-03-31 (×2): 50 ug via INTRAVENOUS

## 2015-03-31 MED ORDER — HEPARIN (PORCINE) IN NACL 2-0.9 UNIT/ML-% IJ SOLN
INTRAMUSCULAR | Status: DC | PRN
  Administered 2015-03-31: 1500 mL

## 2015-03-31 MED ORDER — NITROGLYCERIN 0.4 MG SL SUBL: 0.4000 mg | SUBLINGUAL_TABLET | SUBLINGUAL | Status: DC | PRN

## 2015-03-31 MED ORDER — ACETAMINOPHEN 325 MG PO TABS: 650.0000 mg | ORAL_TABLET | ORAL | Status: DC | PRN

## 2015-03-31 MED ORDER — BIVALIRUDIN 250 MG IV SOLR
250.0000 mg | INTRAVENOUS | Status: DC | PRN
  Administered 2015-03-31 (×2): 1.75 mg/kg/h via INTRAVENOUS

## 2015-03-31 MED ORDER — TICAGRELOR 90 MG PO TABS
90.0000 mg | ORAL_TABLET | Freq: Two times a day (BID) | ORAL | Status: DC
  Administered 2015-04-01 – 2015-04-03 (×5): 90 mg via ORAL
  Filled 2015-03-31 (×5): qty 1

## 2015-03-31 MED ORDER — BIVALIRUDIN 250 MG IV SOLR
INTRAVENOUS | Status: AC
  Filled 2015-03-31: qty 250

## 2015-03-31 MED ORDER — NITROGLYCERIN IN D5W 200-5 MCG/ML-% IV SOLN
INTRAVENOUS | Status: AC
  Filled 2015-03-31: qty 250

## 2015-03-31 MED ORDER — ISOSORBIDE DINITRATE 10 MG PO TABS
20.0000 mg | ORAL_TABLET | Freq: Three times a day (TID) | ORAL | Status: DC
  Administered 2015-03-31 – 2015-04-03 (×8): 20 mg via ORAL
  Filled 2015-03-31: qty 2
  Filled 2015-03-31: qty 1
  Filled 2015-03-31: qty 2
  Filled 2015-03-31: qty 1
  Filled 2015-03-31 (×3): qty 2
  Filled 2015-03-31: qty 1
  Filled 2015-03-31 (×3): qty 2
  Filled 2015-03-31: qty 1
  Filled 2015-03-31: qty 2
  Filled 2015-03-31: qty 1

## 2015-03-31 MED ORDER — NITROGLYCERIN 1 MG/10 ML FOR IR/CATH LAB
INTRA_ARTERIAL | Status: AC
  Filled 2015-03-31: qty 10

## 2015-03-31 MED ORDER — NITROGLYCERIN 1 MG/10 ML FOR IR/CATH LAB
INTRA_ARTERIAL | Status: DC | PRN
  Administered 2015-03-31 (×3): 200 ug via INTRACORONARY

## 2015-03-31 MED ORDER — VITAMIN D 1000 UNITS PO TABS
1000.0000 [IU] | ORAL_TABLET | Freq: Every day | ORAL | Status: DC
  Administered 2015-04-01 – 2015-04-03 (×3): 1000 [IU] via ORAL
  Filled 2015-03-31 (×4): qty 1

## 2015-03-31 MED ORDER — BIVALIRUDIN BOLUS VIA INFUSION - CUPID
INTRAVENOUS | Status: DC | PRN
  Administered 2015-03-31: 52.725 mg via INTRAVENOUS

## 2015-03-31 MED ORDER — TICAGRELOR 90 MG PO TABS
ORAL_TABLET | ORAL | Status: AC
  Filled 2015-03-31: qty 1

## 2015-03-31 MED ORDER — SODIUM CHLORIDE 0.9 % IV SOLN
INTRAVENOUS | Status: DC
  Administered 2015-03-31: 22:00:00 via INTRAVENOUS

## 2015-03-31 MED ORDER — MORPHINE SULFATE (PF) 4 MG/ML IV SOLN
INTRAVENOUS | Status: DC | PRN
  Administered 2015-03-31: 3 mg via INTRAVENOUS

## 2015-03-31 MED ORDER — NITROGLYCERIN 0.4 MG SL SUBL
0.4000 mg | SUBLINGUAL_TABLET | SUBLINGUAL | Status: DC | PRN
  Administered 2015-03-31 (×2): 0.4 mg via SUBLINGUAL

## 2015-03-31 MED ORDER — NITROGLYCERIN IN D5W 200-5 MCG/ML-% IV SOLN
0.0000 ug/min | INTRAVENOUS | Status: DC
  Administered 2015-03-31 – 2015-04-01 (×2): 200 ug/min via INTRAVENOUS
  Administered 2015-04-01: 100 ug/min via INTRAVENOUS
  Filled 2015-03-31 (×3): qty 250

## 2015-03-31 MED ORDER — IOHEXOL 350 MG/ML SOLN
INTRAVENOUS | Status: DC | PRN
  Administered 2015-03-31: 180 mL via INTRAVENOUS

## 2015-03-31 MED ORDER — HEPARIN (PORCINE) IN NACL 2-0.9 UNIT/ML-% IJ SOLN
INTRAMUSCULAR | Status: AC
  Filled 2015-03-31: qty 1000

## 2015-03-31 MED ORDER — SODIUM CHLORIDE 0.9 % IV SOLN
INTRAVENOUS | Status: DC | PRN
  Administered 2015-03-31: 30 mL/h via INTRAVENOUS

## 2015-03-31 MED ORDER — FUROSEMIDE 10 MG/ML IJ SOLN
INTRAMUSCULAR | Status: DC | PRN
  Administered 2015-03-31 (×2): 40 mg via INTRAVENOUS

## 2015-03-31 MED ORDER — LIDOCAINE HCL (PF) 1 % IJ SOLN
INTRAMUSCULAR | Status: DC | PRN
  Administered 2015-03-31: 15 mL

## 2015-03-31 MED ORDER — CLONIDINE HCL 0.1 MG PO TABS
0.1000 mg | ORAL_TABLET | Freq: Every day | ORAL | Status: DC
  Administered 2015-04-01 – 2015-04-03 (×3): 0.1 mg via ORAL
  Filled 2015-03-31 (×4): qty 1

## 2015-03-31 MED ORDER — MORPHINE SULFATE (PF) 10 MG/ML IV SOLN
INTRAVENOUS | Status: AC
  Filled 2015-03-31: qty 1

## 2015-03-31 MED ORDER — ENSURE ENLIVE PO LIQD
237.0000 mL | Freq: Two times a day (BID) | ORAL | Status: DC
  Administered 2015-04-01 – 2015-04-03 (×5): 237 mL via ORAL

## 2015-03-31 MED ORDER — TICAGRELOR 90 MG PO TABS
ORAL_TABLET | ORAL | Status: DC | PRN
  Administered 2015-03-31: 90 mg via ORAL

## 2015-03-31 MED ORDER — SODIUM CHLORIDE 0.9% FLUSH: 3.0000 mL | Freq: Two times a day (BID) | INTRAVENOUS | Status: DC

## 2015-03-31 MED ORDER — NITROGLYCERIN IN D5W 200-5 MCG/ML-% IV SOLN
INTRAVENOUS | Status: DC | PRN
  Administered 2015-03-31: 10 ug/min via INTRAVENOUS

## 2015-03-31 SURGICAL SUPPLY — 22 items
BALLN ANGIOSCULPT RX 2.5X10 (BALLOONS) ×2
BALLN MINITREK RX 2.0X12 (BALLOONS) ×2
BALLN ~~LOC~~ EUPHORA RX 2.75X12 (BALLOONS) ×2
BALLN ~~LOC~~ EUPHORA RX 3.75X8 (BALLOONS) ×2
BALLN ~~LOC~~ TREK RX 3.5X20 (BALLOONS) ×2
BALLOON ANGIOSCULPT RX 2.5X10 (BALLOONS) ×1 IMPLANT
BALLOON MINITREK RX 2.0X12 (BALLOONS) ×1 IMPLANT
BALLOON ~~LOC~~ EUPHORA RX 2.75X12 (BALLOONS) ×1 IMPLANT
BALLOON ~~LOC~~ EUPHORA RX 3.75X8 (BALLOONS) ×1 IMPLANT
BALLOON ~~LOC~~ TREK RX 3.5X20 (BALLOONS) ×1 IMPLANT
CATH INFINITI 5FR MULTPACK ANG (CATHETERS) ×2 IMPLANT
GUIDE CATH RUNWAY 6FR FR4 (CATHETERS) ×2 IMPLANT
KIT ENCORE 26 ADVANTAGE (KITS) ×2 IMPLANT
KIT HEART LEFT (KITS) ×2 IMPLANT
PACK CARDIAC CATHETERIZATION (CUSTOM PROCEDURE TRAY) ×2 IMPLANT
SHEATH PINNACLE 6F 10CM (SHEATH) ×2 IMPLANT
STENT SYNERGY DES 2.5X16 (Permanent Stent) ×2 IMPLANT
TRANSDUCER W/STOPCOCK (MISCELLANEOUS) ×2 IMPLANT
TUBING CIL FLEX 10 FLL-RA (TUBING) ×2 IMPLANT
WIRE COUGAR XT STRL 190CM (WIRE) ×2 IMPLANT
WIRE EMERALD 3MM-J .035X150CM (WIRE) ×2 IMPLANT
WIRE PT2 MS 185 (WIRE) ×2 IMPLANT

## 2015-03-31 NOTE — Progress Notes (Signed)
   03/31/15 2100  Clinical Encounter Type  Visited With Family;Health care provider  Visit Type Initial;Code;ED  Referral From Nurse  Spiritual Encounters  Spiritual Needs Emotional  Stress Factors  Patient Stress Factors None identified  Family Stress Factors None identified   Chaplain responded to the code stemi, Pt was unavailable and no family was present.  When family arrived, Chaplain escorted them to Cath lab Waiting Area and notified Cath Lab team that they were present.    Family member at [employer] [facility] [employer] is Valetta Fuller.  He is the only known local family, as pt has a sister in [place], one [place]) in [place] and both parents are deceased. Pt wife is in [place].  Chaplain offered hospitality, empathetic listening and is available should they desire additional support.  [employer], Chaplain

## 2015-03-31 NOTE — ED Notes (Signed)
Pt taken to Cath lab

## 2015-03-31 NOTE — ED Notes (Signed)
Pt undressed, zoll pads placed.

## 2015-03-31 NOTE — ED Provider Notes (Signed)
CSN: [id number]     Arrival date & time 03/31/15  1918 History   First MD Initiated Contact with Patient 03/31/15 1925     Chief Complaint  Patient presents with  . Code STEMI     HPI  50 y.o. male with history of coronary artery disease status post placement of 17 stents over the last few years at a hospital in [place] state, as well as CHF with a reported ejection fraction of 18%, on Brilinta, aspirin, and Lasix with which he reports compliance, who presents with substernal sharp chest pain. Patient reports that the pain began a couple of hours ago, was initially dull but now has become more severe. Denies radiation of the pain to his back, neck or arms. No nausea or vomiting. Denies lightheadedness. Patient took 2 nitroglycerin en route with EMS with improvement in the pain which she currently reports at 4 out of 10. Denies shortness of breath though is hypoxic on arrival requiring 2 L of oxygen to maintain oxygen saturations in the low 90s. Chest pain is not pleuritic. Denies cough or fevers. No exertional component. Patient reports that his pain feels exactly like his prior heart attacks.  Past Medical History  Diagnosis Date  . Chronic systolic CHF (congestive heart failure) ([place])     a. 01/2015 Echo: EF 20-25%, antlat, inflat AK.  [name] Hyperlipidemia   . Coronary artery disease     a. s/p MI x 4;  b. Reported h/o 17 stents with recurrent ISR;  c. 02/2014 Cath ([place]): PCI to unknown vessel;  d. 01/2015 MV: EF 18%, large scar, no ischemia.  . Hypertensive heart disease   . Ischemic cardiomyopathy     a. s/p SJM ICD 2007 w/ gen change in 2012; b. 12/2014 CPX Test: mild to mod HF limitation w/ pVO2 20.3 and nl slope;  c. 01/2015 Echo: EF 20-25%.  . CKD (chronic kidney disease), stage III    Past Surgical History  Procedure Laterality Date  . [place] placements Bilateral   . Pacemaker generator change Bilateral   . Insert / replace / remove pacemaker      St jude 2006  Generator Change 2012    . Cardiac catheterization      Most recent 02/2014    Family History  Problem Relation Age of Onset  . Cancer Mother     died when pt was 76.  She had a h/o heart dzs as well.  . Hypertension Mother   . Hyperlipidemia Father     Father died when pt was age 89 - he believes that he had a cardiac hx.  [name] CAD Sister    Social History  Substance Use Topics  . Smoking status: Never Smoker   . Smokeless tobacco: None  . Alcohol Use: No    Review of Systems  Constitutional: Negative for fever, chills, activity change and appetite change.  HENT: Negative for congestion, facial swelling, rhinorrhea and sore throat.   Eyes: Negative for visual disturbance.  Respiratory: Negative for cough, shortness of breath and wheezing.   Cardiovascular: Positive for chest pain. Negative for palpitations and leg swelling.  Gastrointestinal: Negative for nausea, vomiting, abdominal pain, diarrhea, constipation and blood in stool.  Genitourinary: Negative for dysuria, frequency, hematuria, flank pain and difficulty urinating.  Musculoskeletal: Negative for myalgias, back pain, joint swelling, arthralgias, neck pain and neck stiffness.  Skin: Negative for rash.  Neurological: Negative for dizziness, weakness, light-headedness and headaches.  Psychiatric/Behavioral: Negative for behavioral problems,  confusion and agitation.      Allergies  Review of patient's allergies indicates no known allergies.  Home Medications   Prior to Admission medications   Medication Sig Start Date End Date Taking? Authorizing Provider  allopurinol (ZYLOPRIM) 100 MG tablet Take 100 mg by mouth 2 (two) times daily.     Historical Provider, MD  amLODipine (NORVASC) 10 MG tablet Take 10 mg by mouth daily.    Historical Provider, MD  aspirin 81 MG tablet Take 81 mg by mouth daily.    Historical Provider, MD  calcitRIOL (ROCALTROL) 0.25 MCG capsule Take 0.25 mcg by mouth every other day. Take on Monday, Wednesday and Friday     Historical Provider, MD  carvedilol (COREG) 25 MG tablet Take 25 mg by mouth 2 (two) times daily with a meal.    Historical Provider, MD  cholecalciferol (VITAMIN D) 1000 UNITS tablet Take 1,000 Units by mouth daily.    Historical Provider, MD  cloNIDine (CATAPRES) 0.1 MG tablet Take 1 tablet (0.1 mg total) by mouth daily. 02/09/15   Jolaine Artist, MD  ezetimibe (ZETIA) 10 MG tablet Take 10 mg by mouth daily.    Historical Provider, MD  furosemide (LASIX) 40 MG tablet Take 40 mg by mouth daily.     Historical Provider, MD  hydrALAZINE (APRESOLINE) 50 MG tablet Take 1 tablet (50 mg total) by mouth 3 (three) times daily. 01/07/15   Jolaine Artist, MD  isosorbide dinitrate (ISORDIL) 20 MG tablet Take 1 tablet (20 mg total) by mouth 3 (three) times daily. 01/07/15   Jolaine Artist, MD  nitroGLYCERIN (NITROSTAT) 0.4 MG SL tablet Place 0.4 mg under the tongue every 5 (five) minutes as needed for chest pain. Reported on 02/09/2015    Historical Provider, MD  omega-3 acid ethyl esters (LOVAZA) 1 G capsule Take 1 g by mouth daily.     Historical Provider, MD  ondansetron (ZOFRAN) 4 MG tablet Take 4 mg by mouth every 8 (eight) hours as needed for nausea or vomiting. Reported on 01/07/2015    Historical Provider, MD  PANTOPRAZOLE SODIUM PO Take 40 mg by mouth as directed.     Historical Provider, MD  potassium chloride (K-DUR,KLOR-CON) 10 MEQ tablet Take 10 mEq by mouth 2 (two) times daily.    Historical Provider, MD  rosuvastatin (CRESTOR) 40 MG tablet Take 40 mg by mouth daily.    Historical Provider, MD  sacubitril-valsartan (ENTRESTO) 97-103 MG Take 1 tablet by mouth 2 (two) times daily. 02/09/15   Jolaine Artist, MD  spironolactone (ALDACTONE) 25 MG tablet Take 25 mg by mouth daily.    Historical Provider, MD  ticagrelor (BRILINTA) 90 MG TABS tablet Take by mouth 2 (two) times daily.    Historical Provider, MD   BP 164/107 mmHg  Pulse 91  Temp(Src) 98.1 F (36.7 C) (Oral)  Resp 20  Ht  5\' 7"  (1.702 m)  Wt 70.308 kg  BMI 24.27 kg/m2  SpO2 99% Physical Exam  Constitutional: He is oriented to person, place, and time. He appears well-developed and well-nourished. No distress.  HENT:  Head: Normocephalic and atraumatic.  Right Ear: External ear normal.  Left Ear: External ear normal.  Nose: Nose normal.  Mouth/Throat: Oropharynx is clear and moist. No oropharyngeal exudate.  Eyes: Conjunctivae are normal. Pupils are equal, round, and reactive to light. Right eye exhibits no discharge. Left eye exhibits no discharge. No scleral icterus.  Neck: Normal range of motion. Neck supple. No tracheal deviation  present.  Cardiovascular: Normal rate, regular rhythm and normal heart sounds.  Exam reveals no gallop and no friction rub.   No murmur heard. Pulmonary/Chest: Effort normal and breath sounds normal. No respiratory distress. He has no wheezes. He has no rales.  Abdominal: Soft. Bowel sounds are normal. He exhibits no distension and no mass. There is no tenderness. There is no rebound and no guarding.  Musculoskeletal: Normal range of motion. He exhibits no edema or tenderness.  Neurological: He is alert and oriented to person, place, and time. No cranial nerve deficit. Coordination normal.  Skin: Skin is warm and dry. No rash noted. He is not diaphoretic.  Psychiatric: He has a normal mood and affect. His behavior is normal. Judgment and thought content normal.    ED Course  Procedures Labs Review Labs Reviewed  CBC - Abnormal; Notable for the following:    RBC 4.06 (*)    MCV 103.7 (*)    Platelets 128 (*)    All other components within normal limits  APTT - Abnormal; Notable for the following:    aPTT 23 (*)    All other components within normal limits  BASIC METABOLIC PANEL - Abnormal; Notable for the following:    Glucose, Bld 179 (*)    Creatinine, Ser 2.03 (*)    GFR calc non Af Amer 37 (*)    GFR calc Af Amer 42 (*)    All other components within normal  limits  CBC - Abnormal; Notable for the following:    RBC 3.66 (*)    Hemoglobin 12.2 (*)    HCT 37.4 (*)    MCV 102.2 (*)    All other components within normal limits  CREATININE, SERUM - Abnormal; Notable for the following:    Creatinine, Ser 1.85 (*)    GFR calc non Af Amer 41 (*)    GFR calc Af Amer 47 (*)    All other components within normal limits  TROPONIN I - Abnormal; Notable for the following:    Troponin I 0.22 (*)    All other components within normal limits  I-STAT TROPOININ, ED - Abnormal; Notable for the following:    Troponin i, poc 0.20 (*)    All other components within normal limits  MRSA PCR SCREENING  DIFFERENTIAL  PROTIME-INR  TROPONIN I  TROPONIN I  BASIC METABOLIC PANEL  LIPID PANEL  CBC    Imaging Review Dg Chest Port 1 View  03/31/2015  CLINICAL DATA:  50 year old male with mid chest pain beginning earlier today progressively worsening. History of coronary artery disease status post multiple stent placement and multiple myocardial infarctions. Congestive heart failure. EXAM: PORTABLE CHEST 1 VIEW COMPARISON:  No priors. FINDINGS: Lung volumes are normal. No consolidative airspace disease. No pleural effusions. No pneumothorax. No suspicious appearing pulmonary nodules or masses. No evidence of pulmonary edema. Heart size is mildly enlarged. Upper mediastinal contours are within normal limits. Atherosclerosis in the thoracic aorta. Coronary artery stents are noted. Left sided pacemaker/AICD with lead tips projecting over the expected location of the right atrium and right ventricular apex. IMPRESSION: 1. No radiographic evidence of acute cardiopulmonary disease. 2. Mild cardiomegaly. 3. Atherosclerosis. Electronically Signed   By: Vinnie Langton M.D.   On: 03/31/2015 19:57   I have personally reviewed and evaluated these images and lab results as part of my medical decision-making.   EKG Interpretation None      MDM   Final diagnoses:  Ischemic  chest pain ([place])  Patient reports that his pain is 410 in intensity on arrival. He is hypertensive to 180/126. Denies shortness of breath but is requiring 2 L of oxygen to maintain SPO2 in the low 90s. EKG findings as above. Troponin elevated 0.22. Patient took 324 mg of aspirin prior to arrival. He was given additional sublingual nitroglycerin while in the ED. Given patient's extensive cardiac history and EKG findings, cardiology was contacted immediately upon patient arrival in the ED. They are recommending that we begin infusion with heparin and transferred to the Cath Lab for emergent cardiac catheterization. Patient was stable at the time of transfer.    Natalyia Innes Algernon Huxley, MD 04/01/15 [id number]  Gareth Morgan, MD 04/01/15 2046

## 2015-03-31 NOTE — H&P (Signed)
History & Physical    Patient ID: Marvin Martin MRN: [id number], DOB/AGE: [birth year]   Admit date: 03/31/2015   Primary Physician: No PCP Per Patient Primary Cardiologist: D. Bensimhon, MD / Corky Downs, MD   Patient Profile    50 y/o ? with a h/o CAD, ICM, syst CHF, and CKD III who presented to the ED on the evening of 3/15 and was found to have anterior ST elevation.  Past Medical History    Past Medical History  Diagnosis Date  . Chronic systolic CHF (congestive heart failure) ([place])     a. 01/2015 Echo: EF 20-25%, antlat, inflat AK.  [name] Hyperlipidemia   . Coronary artery disease     a. s/p MI x 4;  b. Reported h/o 17 stents with recurrent ISR;  c. 02/2014 Cath ([place]): PCI to unknown vessel;  d. 01/2015 MV: EF 18%, large scar, no ischemia.  . Hypertensive heart disease   . Ischemic cardiomyopathy     a. s/p SJM ICD 2007 w/ gen change in 2012; b. 12/2014 CPX Test: mild to mod HF limitation w/ pVO2 20.3 and nl slope;  c. 01/2015 Echo: EF 20-25%.  . CKD (chronic kidney disease), stage III     Past Surgical History  Procedure Laterality Date  . [place] placements Bilateral   . Pacemaker generator change Bilateral   . Insert / replace / remove pacemaker      St jude 2006  Generator Change 2012   . Cardiac catheterization      Most recent 02/2014     Allergies  No Known Allergies  History of Present Illness    50 y/o ? with the above complex PMH.  His cardiac hx dates back to ~ 2004, when he says he suffered his first of 4 MI's.  Over the years, he has had multiple procedures and reportedly has had 17 stents with recurrent ISR.  He also has LV dysfxn and underwent ICD placement in 2007 with subsequent gen change in 2012.  His last cath was in 2016, @ which time he says a stent was placed.    He moved to [facility] in 2016 and established care in the CHF clinic and also with EP.  He had a CPX study in Dec 2016, which showed mild to mod HF limitation.  A myoview was performed in late Jan,  which was low risk.  He has been doing well w/o significant limitations but today developed substernal chest pressure @ approx 1 PM.  This persisted and worsened, prompting him to call EMS.  Upon EMS arrival, ECG showed ant ST elevation with inf reciprocal changes and a Code STEMI was called.  Pt says c/p is currently 3/10 after receiving ASA and ntg.  Home Medications    Prior to Admission medications   Medication Sig Start Date End Date Taking? Authorizing Provider  allopurinol (ZYLOPRIM) 100 MG tablet Take 100 mg by mouth 2 (two) times daily.     Historical Provider, MD  amLODipine (NORVASC) 10 MG tablet Take 10 mg by mouth daily.    Historical Provider, MD  aspirin 81 MG tablet Take 81 mg by mouth daily.    Historical Provider, MD  calcitRIOL (ROCALTROL) 0.25 MCG capsule Take 0.25 mcg by mouth every other day. Take on Monday, Wednesday and Friday    Historical Provider, MD  carvedilol (COREG) 25 MG tablet Take 25 mg by mouth 2 (two) times daily with a meal.    Historical Provider, MD  cholecalciferol (VITAMIN D) 1000 UNITS tablet Take 1,000 Units by mouth daily.    Historical Provider, MD  cloNIDine (CATAPRES) 0.1 MG tablet Take 1 tablet (0.1 mg total) by mouth daily. 02/09/15   Jolaine Artist, MD  ezetimibe (ZETIA) 10 MG tablet Take 10 mg by mouth daily.    Historical Provider, MD  furosemide (LASIX) 40 MG tablet Take 40 mg by mouth daily.     Historical Provider, MD  hydrALAZINE (APRESOLINE) 50 MG tablet Take 1 tablet (50 mg total) by mouth 3 (three) times daily. 01/07/15   Jolaine Artist, MD  isosorbide dinitrate (ISORDIL) 20 MG tablet Take 1 tablet (20 mg total) by mouth 3 (three) times daily. 01/07/15   Jolaine Artist, MD  nitroGLYCERIN (NITROSTAT) 0.4 MG SL tablet Place 0.4 mg under the tongue every 5 (five) minutes as needed for chest pain. Reported on 02/09/2015    Historical Provider, MD  omega-3 acid ethyl esters (LOVAZA) 1 G capsule Take 1 g by mouth daily.     Historical  Provider, MD  ondansetron (ZOFRAN) 4 MG tablet Take 4 mg by mouth every 8 (eight) hours as needed for nausea or vomiting. Reported on 01/07/2015    Historical Provider, MD  PANTOPRAZOLE SODIUM PO Take 40 mg by mouth as directed.     Historical Provider, MD  potassium chloride (K-DUR,KLOR-CON) 10 MEQ tablet Take 10 mEq by mouth 2 (two) times daily.    Historical Provider, MD  rosuvastatin (CRESTOR) 40 MG tablet Take 40 mg by mouth daily.    Historical Provider, MD  sacubitril-valsartan (ENTRESTO) 97-103 MG Take 1 tablet by mouth 2 (two) times daily. 02/09/15   Jolaine Artist, MD  spironolactone (ALDACTONE) 25 MG tablet Take 25 mg by mouth daily.    Historical Provider, MD  ticagrelor (BRILINTA) 90 MG TABS tablet Take by mouth 2 (two) times daily.    Historical Provider, MD    Family History    Family History  Problem Relation Age of Onset  . Cancer Mother     died when pt was 57.  She had a h/o heart dzs as well.  . Hypertension Mother   . Hyperlipidemia Father     Father died when pt was age 10 - he believes that he had a cardiac hx.  [name] CAD Sister     Social History    Social History   Social History  . Marital Status: Legally Separated    Spouse Name: N/A  . Number of Children: N/A  . Years of Education: N/A   Occupational History  . Not on file.   Social History Main Topics  . Smoking status: Never Smoker   . Smokeless tobacco: Not on file  . Alcohol Use: No  . Drug Use: No  . Sexual Activity: No   Other Topics Concern  . Not on file   Social History Narrative   Moved to [place] from [place] in 2016.  Lives alone in [place].     Review of Systems    General:  No chills, fever, night sweats or weight changes.  Cardiovascular:  +++ chest pain, no dyspnea on exertion, edema, orthopnea, palpitations, paroxysmal nocturnal dyspnea. Dermatological: No rash, lesions/masses Respiratory: No cough, dyspnea Urologic: No hematuria, dysuria Abdominal:   No nausea, vomiting, diarrhea,  bright red blood per rectum, melena, or hematemesis Neurologic:  No visual changes, wkns, changes in mental status. All other systems reviewed and are otherwise negative except as noted above.  Physical  Exam    Blood pressure 188/124, pulse 116, temperature 99.8 F (37.7 C), resp. rate 27, height 5\' 7"  (1.702 m), weight 155 lb (70.308 kg), SpO2 92 %.  General: Pleasant, NAD Psych: Normal affect. Neuro: Alert and oriented X 3. Moves all extremities spontaneously. HEENT: Normal  Neck: Supple without bruits or JVD. Lungs:  Resp regular and unlabored, CTA. Heart: RRR no s3, s4, or murmurs. Abdomen: Soft, non-tender, non-distended, BS + x 4.  Extremities: No clubbing, cyanosis or edema. DP/PT/Radials 2+ and equal bilaterally.  Labs    Troponin [facility] Test)  Recent Labs  06-Apr-2015 1930  TROPIPOC 0.20*   Lab Results  Component Value Date   WBC 5.7 04-06-2015   HGB 13.7 2015/04/06   HCT 42.1 04/06/15   MCV 103.7* 04/06/15   PLT 128* Apr 06, 2015    Recent Labs Lab Apr 06, 2015 1925  NA 143  K 4.2  CL 108  CO2 22  BUN 17  CREATININE 2.03*  CALCIUM 9.5  GLUCOSE 179*    Radiology Studies    Dg Chest Port 1 View  2015-04-06  CLINICAL DATA:  50 year old male with mid chest pain beginning earlier today progressively worsening. History of coronary artery disease status post multiple stent placement and multiple myocardial infarctions. Congestive heart failure. EXAM: PORTABLE CHEST 1 VIEW COMPARISON:  No priors. FINDINGS: Lung volumes are normal. No consolidative airspace disease. No pleural effusions. No pneumothorax. No suspicious appearing pulmonary nodules or masses. No evidence of pulmonary edema. Heart size is mildly enlarged. Upper mediastinal contours are within normal limits. Atherosclerosis in the thoracic aorta. Coronary artery stents are noted. Left sided pacemaker/AICD with lead tips projecting over the expected location of the right atrium and right ventricular  apex. IMPRESSION: 1. No radiographic evidence of acute cardiopulmonary disease. 2. Mild cardiomegaly. 3. Atherosclerosis. Electronically Signed   By: Vinnie Langton M.D.   On: Apr 06, 2015 19:57    ECG & Cardiac Imaging    Sinus tach, 116, 2-4 mm ant ST elev with inflat ST dep and lat TWI - J point/ST elev slightly more pronounced than on prior ECGs.  Assessment & Plan    1.  Acute anterior STEMI/CAD:  Pt with a significant h/o CAD dating back to his mid-40's with multiple PCI's - last in 2016.  He recently had a neg MV in late Jan but today developed c/p with ant ST elevation on ECG.  A Code STEMI has been activated and he is currently undergoing cath.  Plan to continue home meds which include asa, statin, nitrate,  blocker, and brilinta.  2.  Chronic systolic chf/ICM:  EF 0000000 by echo earlier this year.  Euvolemic on exam.  Cont  blocker, hydral/nitrate.  Hold entresto, PO lasix, spiro.  3.  Hypertensive Heart Disease:  BP markedly elevated in the setting of c/p.  Has not taken pm doses of meds.  Plan to resume if creat stable in AM.  May need IV ntg.  4.  HL:  Continue high potency statin therapy.  5.  CKD III:  Creat 2.0 on admission.  Follow post-cath.  Hydration may be limited by #2.  Hold entresto, spiro.  Follow.  Signed, Murray Hodgkins, NP Apr 06, 2015, 8:09 PM  Agree with assessment and plan.  Marvin Martin is a 50 year old African-American male who is from [place].  Since aproximately 2003, he has had significant cardiac history and over the past 14 years has had 17 coronary stents inserted.  In 2007 he developed asignificant ischemic  cardiomyopathy and underwent ICD implantation.  He states most of these procedures have been done at [facility] in [place].  He moved to the [place] area in late 2016 and has established in the CHF clinic and also with EP He admits to taking his medications and has been on dual antiplatelet therapy with aspirin and  Brilinta.  Today he experienced recurrent substernal chest pressure which was of similar quality than his previous episodes of angina.  He was brought by EMS to [facility] ER.  In transit, a code STEMI was activated.  Upon arrival to the emergency room, the patient was still complaining of chest discomfort.  He was started on heparin and nitrates and is brought urgently to the cardiac catheterization laboratory for emergent cardiac catheterization and possible intervention.   Troy Sine, MD, [facility] 03/31/2015

## 2015-03-31 NOTE — Telephone Encounter (Signed)
Patient returned phone call.  Reviewed MDs in the cardiac/EP department with patient and he recognized Dr. Burnard Hawthorne name.  Patient is aware that we will resume remote monitoring as scheduled as soon as his monitoring is transferred to [place].  He is appreciative of call and denies questions or concerns at this time.  Called Dr. Lennox Grumbles office to request patient's release in [place].  Spoke with receptionist, who states that she will release him today.

## 2015-03-31 NOTE — Telephone Encounter (Signed)
[facility] requesting call back.  Need to determine which cardiology practice followed patient's ICD in order to request [place] transfer.

## 2015-03-31 NOTE — ED Notes (Signed)
Pt to ED from home by EMS c/o substernal chest pain onset 21mins prior to arrival. Hypertensive on EMS arrival at 216/145. Nitro x 2 and 324 mg ASA given enroute with decreased in pain. Hx of MI a year ago, pain feels similar

## 2015-04-01 ENCOUNTER — Encounter (HOSPITAL_COMMUNITY): Payer: Self-pay | Admitting: Cardiovascular Disease

## 2015-04-01 DIAGNOSIS — I2119 ST elevation (STEMI) myocardial infarction involving other coronary artery of inferior wall: Secondary | ICD-10-CM

## 2015-04-01 LAB — CBC
HEMATOCRIT: 39.5 % (ref 39.0–52.0)
HEMOGLOBIN: 13 g/dL (ref 13.0–17.0)
MCH: 33 pg (ref 26.0–34.0)
MCHC: 32.9 g/dL (ref 30.0–36.0)
MCV: 100.3 fL — ABNORMAL HIGH (ref 78.0–100.0)
Platelets: 165 10*3/uL (ref 150–400)
RBC: 3.94 MIL/uL — AB (ref 4.22–5.81)
RDW: 13.1 % (ref 11.5–15.5)
WBC: 6.5 10*3/uL (ref 4.0–10.5)

## 2015-04-01 LAB — LIPID PANEL
Cholesterol: 171 mg/dL (ref 0–200)
HDL: 35 mg/dL — ABNORMAL LOW (ref >40)
LDL CALC: 115 mg/dL — AB (ref 0–99)
Total CHOL/HDL Ratio: 4.9 RATIO
Triglycerides: 105 mg/dL (ref <150)
VLDL: 21 mg/dL (ref 0–40)

## 2015-04-01 LAB — BASIC METABOLIC PANEL
Anion gap: 16 — ABNORMAL HIGH (ref 5–15)
BUN: 19 mg/dL (ref 6–20)
CALCIUM: 9.1 mg/dL (ref 8.9–10.3)
CO2: 24 mmol/L (ref 22–32)
CREATININE: 1.68 mg/dL — AB (ref 0.61–1.24)
Chloride: 104 mmol/L (ref 101–111)
GFR calc Af Amer: 53 mL/min — ABNORMAL LOW (ref >60)
GFR calc non Af Amer: 46 mL/min — ABNORMAL LOW (ref >60)
GLUCOSE: 111 mg/dL — AB (ref 65–99)
Potassium: 3 mmol/L — ABNORMAL LOW (ref 3.5–5.1)
Sodium: 144 mmol/L (ref 135–145)

## 2015-04-01 LAB — MRSA PCR SCREENING: MRSA BY PCR: NEGATIVE

## 2015-04-01 LAB — TROPONIN I
TROPONIN I: 0.35 ng/mL — AB (ref <0.031)
Troponin I: 0.75 ng/mL (ref <0.031)

## 2015-04-01 LAB — POCT ACTIVATED CLOTTING TIME: Activated Clotting Time: 538 seconds

## 2015-04-01 MED ORDER — HYDRALAZINE HCL 20 MG/ML IJ SOLN
10.0000 mg | INTRAMUSCULAR | Status: AC | PRN
  Administered 2015-04-01 (×3): 10 mg via INTRAVENOUS
  Filled 2015-04-01 (×2): qty 1

## 2015-04-01 MED ORDER — SACUBITRIL-VALSARTAN 97-103 MG PO TABS
1.0000 | ORAL_TABLET | Freq: Two times a day (BID) | ORAL | Status: DC
Start: 2015-04-01 — End: 2015-04-03
  Administered 2015-04-01 – 2015-04-03 (×5): 1 via ORAL
  Filled 2015-04-01 (×8): qty 1

## 2015-04-01 MED ORDER — CLONIDINE HCL 0.1 MG PO TABS
0.1000 mg | ORAL_TABLET | Freq: Once | ORAL | Status: AC
  Administered 2015-04-01: 0.1 mg via ORAL

## 2015-04-01 MED ORDER — LABETALOL HCL 5 MG/ML IV SOLN
INTRAVENOUS | Status: AC
Start: 2015-04-01 — End: 2015-04-01
  Filled 2015-04-01: qty 4

## 2015-04-01 MED ORDER — POTASSIUM CHLORIDE CRYS ER 20 MEQ PO TBCR
40.0000 meq | EXTENDED_RELEASE_TABLET | ORAL | Status: AC
  Administered 2015-04-01 (×2): 40 meq via ORAL
  Filled 2015-04-01 (×3): qty 2

## 2015-04-01 MED ORDER — ATROPINE SULFATE 0.1 MG/ML IJ SOLN
INTRAMUSCULAR | Status: DC
Start: 2015-04-01 — End: 2015-04-01
  Filled 2015-04-01: qty 10

## 2015-04-01 MED ORDER — LABETALOL HCL 5 MG/ML IV SOLN
10.0000 mg | Freq: Once | INTRAVENOUS | Status: AC
Start: 2015-04-01 — End: 2015-04-01
  Administered 2015-04-01: 10 mg via INTRAVENOUS

## 2015-04-01 MED ORDER — SPIRONOLACTONE 25 MG PO TABS
25.0000 mg | ORAL_TABLET | Freq: Every day | ORAL | Status: DC
  Administered 2015-04-01 – 2015-04-03 (×3): 25 mg via ORAL
  Filled 2015-04-01 (×3): qty 1

## 2015-04-01 MED ORDER — POTASSIUM CHLORIDE CRYS ER 20 MEQ PO TBCR
40.0000 meq | EXTENDED_RELEASE_TABLET | Freq: Once | ORAL | Status: AC
  Administered 2015-04-01: 40 meq via ORAL
  Filled 2015-04-01: qty 2

## 2015-04-01 MED FILL — Morphine Sulfate IV Soln PF 10 MG/ML: INTRAVENOUS | Qty: 1 | Status: AC

## 2015-04-01 NOTE — Progress Notes (Signed)
Subjective:  POD #1 STEMI, RCA PCI/ DES. On IV NTG. No CP  Objective:  Temp:  [98.1 F (36.7 C)-99.8 F (37.7 C)] 98.3 F (36.8 C) (03/16 0739) Pulse Rate:  [51-123] 91 (03/16 0800) Resp:  [0-57] 20 (03/15 2150) BP: (108-188)/(71-133) 123/71 mmHg (03/16 0800) SpO2:  [88 %-100 %] 100 % (03/16 0800) Weight:  [142 lb 6.7 oz (64.6 kg)-155 lb (70.308 kg)] 142 lb 6.7 oz (64.6 kg) (03/16 0423) Weight change:   Intake/Output from previous day: 03/15 0701 - 03/16 0700 In: 723.9 [I.V.:723.9] Out: 4275 [Urine:4275]  Intake/Output from this shift: Total I/O In: 135.2 [I.V.:135.2] Out: 150 [Urine:150]  Physical Exam: General appearance: alert and no distress Neck: no adenopathy, no carotid bruit, no JVD, supple, symmetrical, trachea midline and thyroid not enlarged, symmetric, no tenderness/mass/nodules Lungs: clear to auscultation bilaterally Heart: regular rate and rhythm, S1, S2 normal, no murmur, click, rub or gallop Extremities: extremities normal, atraumatic, no cyanosis or edema and Right groin OK  Lab Results: Results for orders placed or performed during the hospital encounter of 03/31/15 (from the past 48 hour(s))  CBC     Status: Abnormal   Collection Time: 03/31/15  7:25 PM  Result Value Ref Range   WBC 5.7 4.0 - 10.5 K/uL   RBC 4.06 (L) 4.22 - 5.81 MIL/uL   Hemoglobin 13.7 13.0 - 17.0 g/dL   HCT 42.1 39.0 - 52.0 %   MCV 103.7 (H) 78.0 - 100.0 fL   MCH 33.7 26.0 - 34.0 pg   MCHC 32.5 30.0 - 36.0 g/dL   RDW 13.4 11.5 - 15.5 %   Platelets 128 (L) 150 - 400 K/uL  Differential     Status: None   Collection Time: 03/31/15  7:25 PM  Result Value Ref Range   Neutrophils Relative % 71 %   Neutro Abs 4.1 1.7 - 7.7 K/uL   Lymphocytes Relative 20 %   Lymphs Abs 1.1 0.7 - 4.0 K/uL   Monocytes Relative 8 %   Monocytes Absolute 0.4 0.1 - 1.0 K/uL   Eosinophils Relative 1 %   Eosinophils Absolute 0.1 0.0 - 0.7 K/uL   Basophils Relative 0 %   Basophils Absolute 0.0  0.0 - 0.1 K/uL  Protime-INR     Status: None   Collection Time: 03/31/15  7:25 PM  Result Value Ref Range   Prothrombin Time 14.9 11.6 - 15.2 seconds   INR 1.15 0.00 - 1.49  APTT     Status: Abnormal   Collection Time: 03/31/15  7:25 PM  Result Value Ref Range   aPTT 23 (L) 24 - 37 seconds  Basic metabolic panel     Status: Abnormal   Collection Time: 03/31/15  7:25 PM  Result Value Ref Range   Sodium 143 135 - 145 mmol/L   Potassium 4.2 3.5 - 5.1 mmol/L   Chloride 108 101 - 111 mmol/L   CO2 22 22 - 32 mmol/L   Glucose, Bld 179 (H) 65 - 99 mg/dL   BUN 17 6 - 20 mg/dL   Creatinine, Ser 2.03 (H) 0.61 - 1.24 mg/dL   Calcium 9.5 8.9 - 10.3 mg/dL   GFR calc non Af Amer 37 (L) >60 mL/min   GFR calc Af Amer 42 (L) >60 mL/min    Comment: (NOTE) The eGFR has been calculated using the CKD EPI equation. This calculation has not been validated in all clinical situations. eGFR's persistently <60 mL/min signify possible Chronic Kidney Disease.  Anion gap 13 5 - 15  I-stat troponin, ED  (not at [facility])     Status: Abnormal   Collection Time: 03/31/15  7:30 PM  Result Value Ref Range   Troponin i, poc 0.20 (HH) 0.00 - 0.08 ng/mL   Comment NOTIFIED PHYSICIAN    Comment 3            Comment: Due to the release kinetics of cTnI, a negative result within the first hours of the onset of symptoms does not rule out myocardial infarction with certainty. If myocardial infarction is still suspected, repeat the test at appropriate intervals.   CBC     Status: Abnormal   Collection Time: 03/31/15 10:30 PM  Result Value Ref Range   WBC 6.2 4.0 - 10.5 K/uL   RBC 3.66 (L) 4.22 - 5.81 MIL/uL   Hemoglobin 12.2 (L) 13.0 - 17.0 g/dL   HCT 37.4 (L) 39.0 - 52.0 %   MCV 102.2 (H) 78.0 - 100.0 fL   MCH 33.3 26.0 - 34.0 pg   MCHC 32.6 30.0 - 36.0 g/dL   RDW 13.3 11.5 - 15.5 %   Platelets 170 150 - 400 K/uL  Creatinine, serum     Status: Abnormal   Collection Time: 03/31/15 10:30 PM  Result  Value Ref Range   Creatinine, Ser 1.85 (H) 0.61 - 1.24 mg/dL   GFR calc non Af Amer 41 (L) >60 mL/min   GFR calc Af Amer 47 (L) >60 mL/min    Comment: (NOTE) The eGFR has been calculated using the CKD EPI equation. This calculation has not been validated in all clinical situations. eGFR's persistently <60 mL/min signify possible Chronic Kidney Disease.   Troponin I     Status: Abnormal   Collection Time: 03/31/15 10:30 PM  Result Value Ref Range   Troponin I 0.22 (H) <0.031 ng/mL    Comment:        PERSISTENTLY INCREASED TROPONIN VALUES IN THE RANGE OF 0.04-0.49 ng/mL CAN BE SEEN IN:       -UNSTABLE ANGINA       -CONGESTIVE HEART FAILURE       -MYOCARDITIS       -CHEST TRAUMA       -ARRYHTHMIAS       -LATE PRESENTING MYOCARDIAL INFARCTION       -COPD   CLINICAL FOLLOW-UP RECOMMENDED.   MRSA PCR Screening     Status: None   Collection Time: 03/31/15 10:32 PM  Result Value Ref Range   MRSA by PCR NEGATIVE NEGATIVE    Comment:        The GeneXpert MRSA Assay (FDA approved for NASAL specimens only), is one component of a comprehensive MRSA colonization surveillance program. It is not intended to diagnose MRSA infection nor to guide or monitor treatment for MRSA infections.   Troponin I     Status: Abnormal   Collection Time: 04/01/15  3:30 AM  Result Value Ref Range   Troponin I 0.35 (H) <0.031 ng/mL    Comment:        PERSISTENTLY INCREASED TROPONIN VALUES IN THE RANGE OF 0.04-0.49 ng/mL CAN BE SEEN IN:       -UNSTABLE ANGINA       -CONGESTIVE HEART FAILURE       -MYOCARDITIS       -CHEST TRAUMA       -ARRYHTHMIAS       -LATE PRESENTING MYOCARDIAL INFARCTION       -COPD   CLINICAL FOLLOW-UP  RECOMMENDED.   Basic metabolic panel     Status: Abnormal   Collection Time: 04/01/15  3:30 AM  Result Value Ref Range   Sodium 144 135 - 145 mmol/L   Potassium 3.0 (L) 3.5 - 5.1 mmol/L    Comment: DELTA CHECK NOTED   Chloride 104 101 - 111 mmol/L   CO2 24 22 - 32  mmol/L   Glucose, Bld 111 (H) 65 - 99 mg/dL   BUN 19 6 - 20 mg/dL   Creatinine, Ser 1.68 (H) 0.61 - 1.24 mg/dL   Calcium 9.1 8.9 - 10.3 mg/dL   GFR calc non Af Amer 46 (L) >60 mL/min   GFR calc Af Amer 53 (L) >60 mL/min    Comment: (NOTE) The eGFR has been calculated using the CKD EPI equation. This calculation has not been validated in all clinical situations. eGFR's persistently <60 mL/min signify possible Chronic Kidney Disease.    Anion gap 16 (H) 5 - 15  Lipid panel     Status: Abnormal   Collection Time: 04/01/15  3:30 AM  Result Value Ref Range   Cholesterol 171 0 - 200 mg/dL   Triglycerides 105 <150 mg/dL   HDL 35 (L) >40 mg/dL   Total CHOL/HDL Ratio 4.9 RATIO   VLDL 21 0 - 40 mg/dL   LDL Cholesterol 115 (H) 0 - 99 mg/dL    Comment:        Total Cholesterol/HDL:CHD Risk Coronary Heart Disease Risk Table                     Men   Women  1/2 Average Risk   3.4   3.3  Average Risk       5.0   4.4  2 X Average Risk   9.6   7.1  3 X Average Risk  23.4   11.0        Use the calculated Patient Ratio above and the CHD Risk Table to determine the patient's CHD Risk.        ATP III CLASSIFICATION (LDL):  <100     mg/dL   Optimal  100-129  mg/dL   Near or Above                    Optimal  130-159  mg/dL   Borderline  160-189  mg/dL   High  >190     mg/dL   Very High   CBC     Status: Abnormal   Collection Time: 04/01/15  3:30 AM  Result Value Ref Range   WBC 6.5 4.0 - 10.5 K/uL   RBC 3.94 (L) 4.22 - 5.81 MIL/uL   Hemoglobin 13.0 13.0 - 17.0 g/dL   HCT 39.5 39.0 - 52.0 %   MCV 100.3 (H) 78.0 - 100.0 fL   MCH 33.0 26.0 - 34.0 pg   MCHC 32.9 30.0 - 36.0 g/dL   RDW 13.1 11.5 - 15.5 %   Platelets 165 150 - 400 K/uL    Imaging: Imaging results have been reviewed  Tele- NSR. A sensed v-paced (ICD), diffuse TWI inferolat  Assessment/Plan:   1. Active Problems: 2.   Ischemic chest pain ([place]) 3.   Cardiomyopathy, ischemic 4.   Acute ST elevation myocardial  infarction (STEMI) ([place]) 5.   ST elevation (STEMI) myocardial infarction involving right coronary artery ([place]) 6.   Time Spent Directly with Patient:  30 minutes  Length of Stay:  LOS: 1 day  POD # 1 STEMI Rx with PCI/DES RCA for ISR. LAD/LCX stents widely patent. H/ O ISCM with EF 20-25%, non ischemic MV 1/17. Currently pain free on IV NTG. Trop low. Entresto/ spironolactone was on hold, Scr back to baseline ---> restart. On DAPT. K 3.0-->> replete. OK to Tx stepdown. [facility]. Prob out to tele tomorrow and home Sat.  Quay Burow 04/01/2015, 10:01 AM

## 2015-04-01 NOTE — Progress Notes (Signed)
Nutrition Brief Note  Patient identified on the Malnutrition Screening Tool (MST) Report  Wt Readings from Last 15 Encounters:  04/01/15 142 lb 6.7 oz (64.6 kg)  03/24/15 153 lb 3.2 oz (69.491 kg)  02/15/15 151 lb (68.493 kg)  02/09/15 151 lb 4 oz (68.607 kg)  01/07/15 150 lb 1.9 oz (68.094 kg)  12/11/14 152 lb (68.947 kg)    Body mass index is 22.3 kg/(m^2). Patient meets criteria for normal weight based on current BMI.   Current diet order is Heart Healthy, patient is consuming approximately 100% of meals at this time. Labs and medications reviewed.   No nutrition interventions warranted at this time. If nutrition issues arise, please consult RD.   Satira Anis. Elyan Vanwieren, MS, RD LDN After Hours/Weekend Pager [phone]

## 2015-04-01 NOTE — Progress Notes (Signed)
Femoral sheath pulled. Patient stable throughout pull. Level 1 at the site. Held pressure for  45 minutes    with Thurmond Butts, RN and Junie Panning, Therapist, sports. Will continue to monitor patient and site.

## 2015-04-01 NOTE — Progress Notes (Signed)
CRITICAL VALUE ALERT  Critical value received:  Troponin 0.75 Date of notification:  04/01/2014  Time of notification:  [id number]  Critical value read back:Yes.    Nurse who received alert:  Marta Lamas RN   MD notified (1st page):    Time of first page:  1145  MD notified (2nd page):Loma Sousa PA  Time of second page:  Responding MD: Loma Sousa PA  Time MD responded:  1150

## 2015-04-01 NOTE — Progress Notes (Signed)
CARDIAC REHAB PHASE I   Pt in bed resting, encouraged OOB to chair this afternoon, will ambulate tomorrow morning. Began MI/stent/CHF education.  Reviewed risk factors, anti-platelet therapy, stent card, activity restrictions, ntg, CHF booklet and zone tool, daily weights, sodium and fluid restrictions, and phase 2 cardiac rehab. Pt verbalized understanding, receptive to review of education. Pt agrees to phase 2 cardiac rehab referral, will send to [facility]. Pt in bed, call bell within reach, will follow-up tomorrow to ambulate/finish education.   [place], RN, BSN 04/01/2015 3:27 PM

## 2015-04-01 NOTE — Care Management Note (Signed)
Case Management Note  Patient Details  Name: Marvin Martin MRN: [id number] Date of Birth: [birth year]  Subjective/Objective:   Adm w mi                Action/Plan: lives alone   Expected Discharge Date:                  Expected Discharge Plan:  Home/Self Care  In-House Referral:     Discharge planning Services  CM Consult, Medication Assistance  Post Acute Care Choice:    Choice offered to:     DME Arranged:    DME Agency:     HH Arranged:    [place] Agency:     Status of Service:     Medicare Important Message Given:    Date Medicare IM Given:    Medicare IM give by:    Date Additional Medicare IM Given:    Additional Medicare Important Message give by:     If discussed at [place] of Stay Meetings, dates discussed:    Additional Comments: ur review done. Spoke w pt and gave him 30day free brilinta card. No medicare d ins. Enc pt to call 888 number on brilinta card and they may give addit 60days of med. Also placed pt assist form on shadow chart for md to sign so pt can mail in w proof of income to get med thru drug company.  Lacretia Leigh, RN 04/01/2015, 11:10 AM

## 2015-04-02 LAB — BASIC METABOLIC PANEL
Anion gap: 10 (ref 5–15)
BUN: 29 mg/dL — AB (ref 6–20)
CHLORIDE: 106 mmol/L (ref 101–111)
CO2: 25 mmol/L (ref 22–32)
CREATININE: 2.03 mg/dL — AB (ref 0.61–1.24)
Calcium: 9.3 mg/dL (ref 8.9–10.3)
GFR calc Af Amer: 42 mL/min — ABNORMAL LOW (ref >60)
GFR calc non Af Amer: 37 mL/min — ABNORMAL LOW (ref >60)
Glucose, Bld: 170 mg/dL — ABNORMAL HIGH (ref 65–99)
Potassium: 4.6 mmol/L (ref 3.5–5.1)
SODIUM: 141 mmol/L (ref 135–145)

## 2015-04-02 NOTE — Progress Notes (Signed)
CARDIAC REHAB PHASE I   PRE:  Rate/Rhythm: 93 SR PVCs  BP:  Supine: 139/95  Sitting:   Standing:    SaO2: 99%RA  MODE:  Ambulation: 740 ft   POST:  Rate/Rhythm: 101 ST PVCs  BP:  Supine:   Sitting: 135/84  Standing:    SaO2: 98%RA 0950-1025 Pt walked 740 ft with steady gait and no CP. Tolerated well. Reviewed ex ed and gave pt heart healthy and low sodium diets. Encouraged pt to read. Discussed with low EF watching sodium and keeping to 2000 mg restriction. Pt stated he knows importance of weighing daily. Referring to [place] CRP 2. Stated he did CRP 2 in [place] before.  Discussed some heart healthy food tips. To recliner with call bell.   Graylon Good, RN BSN  04/02/2015 10:23 AM

## 2015-04-02 NOTE — Plan of Care (Signed)
Problem: Cardiac: Goal: Ability to achieve and maintain adequate cardiopulmonary perfusion will improve Outcome: Progressing VSS. Tolerates ambulation in Room without CP, SOB, or Dizziness.

## 2015-04-02 NOTE — Telephone Encounter (Signed)
Patient successfully transferred in [facility].

## 2015-04-02 NOTE — Plan of Care (Signed)
Problem: Education: Goal: Understanding of medication regimen will improve Outcome: Progressing Assess for educational needs related to disease process.

## 2015-04-02 NOTE — Care Management Important Message (Signed)
Important Message  Patient Details  Name: Marvin Martin MRN: [id number] Date of Birth: May 04, 1965   Medicare Important Message Given:  Yes    Maleta Pacha P Minela [place] 04/02/2015, 2:19 PM

## 2015-04-02 NOTE — Progress Notes (Signed)
Subjective:  POD # 2 Inf STEMI Rx with PCI and Re DES.No CP/SOB  Objective:  Temp:  [97.7 F (36.5 C)-98.2 F (36.8 C)] 98.2 F (36.8 C) (03/17 0844) Pulse Rate:  [88-97] 97 (03/17 0844) Resp:  [18-24] 24 (03/17 0844) BP: (10-136)/(4-99) 107/88 mmHg (03/17 0844) SpO2:  [91 %-100 %] 98 % (03/17 0844) Weight:  [142 lb 3.2 oz (64.5 kg)] 142 lb 3.2 oz (64.5 kg) (03/17 0500) Weight change: -12 lb 12.9 oz (-5.807 kg)  Intake/Output from previous day: 03/16 0701 - 03/17 0700 In: 615.2 [P.O.:480; I.V.:135.2] Out: 250 [Urine:250]  Intake/Output from this shift:    Physical Exam: General appearance: alert and no distress Neck: no adenopathy, no carotid bruit, no JVD, supple, symmetrical, trachea midline and thyroid not enlarged, symmetric, no tenderness/mass/nodules Lungs: clear to auscultation bilaterally Heart: regular rate and rhythm, S1, S2 normal, no murmur, click, rub or gallop Extremities: extremities normal, atraumatic, no cyanosis or edema  Lab Results: Results for orders placed or performed during the hospital encounter of 03/31/15 (from the past 48 hour(s))  CBC     Status: Abnormal   Collection Time: 03/31/15  7:25 PM  Result Value Ref Range   WBC 5.7 4.0 - 10.5 K/uL   RBC 4.06 (L) 4.22 - 5.81 MIL/uL   Hemoglobin 13.7 13.0 - 17.0 g/dL   HCT 42.1 39.0 - 52.0 %   MCV 103.7 (H) 78.0 - 100.0 fL   MCH 33.7 26.0 - 34.0 pg   MCHC 32.5 30.0 - 36.0 g/dL   RDW 13.4 11.5 - 15.5 %   Platelets 128 (L) 150 - 400 K/uL  Differential     Status: None   Collection Time: 03/31/15  7:25 PM  Result Value Ref Range   Neutrophils Relative % 71 %   Neutro Abs 4.1 1.7 - 7.7 K/uL   Lymphocytes Relative 20 %   Lymphs Abs 1.1 0.7 - 4.0 K/uL   Monocytes Relative 8 %   Monocytes Absolute 0.4 0.1 - 1.0 K/uL   Eosinophils Relative 1 %   Eosinophils Absolute 0.1 0.0 - 0.7 K/uL   Basophils Relative 0 %   Basophils Absolute 0.0 0.0 - 0.1 K/uL  Protime-INR     Status: None   Collection Time: 03/31/15  7:25 PM  Result Value Ref Range   Prothrombin Time 14.9 11.6 - 15.2 seconds   INR 1.15 0.00 - 1.49  APTT     Status: Abnormal   Collection Time: 03/31/15  7:25 PM  Result Value Ref Range   aPTT 23 (L) 24 - 37 seconds  Basic metabolic panel     Status: Abnormal   Collection Time: 03/31/15  7:25 PM  Result Value Ref Range   Sodium 143 135 - 145 mmol/L   Potassium 4.2 3.5 - 5.1 mmol/L   Chloride 108 101 - 111 mmol/L   CO2 22 22 - 32 mmol/L   Glucose, Bld 179 (H) 65 - 99 mg/dL   BUN 17 6 - 20 mg/dL   Creatinine, Ser 2.03 (H) 0.61 - 1.24 mg/dL   Calcium 9.5 8.9 - 10.3 mg/dL   GFR calc non Af Amer 37 (L) >60 mL/min   GFR calc Af Amer 42 (L) >60 mL/min    Comment: (NOTE) The eGFR has been calculated using the CKD EPI equation. This calculation has not been validated in all clinical situations. eGFR's persistently <60 mL/min signify possible Chronic Kidney Disease.    Anion gap 13 5 -  15  I-stat troponin, ED  (not at [facility])     Status: Abnormal   Collection Time: 03/31/15  7:30 PM  Result Value Ref Range   Troponin i, poc 0.20 (HH) 0.00 - 0.08 ng/mL   Comment NOTIFIED PHYSICIAN    Comment 3            Comment: Due to the release kinetics of cTnI, a negative result within the first hours of the onset of symptoms does not rule out myocardial infarction with certainty. If myocardial infarction is still suspected, repeat the test at appropriate intervals.   POCT Activated clotting time     Status: None   Collection Time: 03/31/15  8:27 PM  Result Value Ref Range   Activated Clotting Time 538 seconds  CBC     Status: Abnormal   Collection Time: 03/31/15 10:30 PM  Result Value Ref Range   WBC 6.2 4.0 - 10.5 K/uL   RBC 3.66 (L) 4.22 - 5.81 MIL/uL   Hemoglobin 12.2 (L) 13.0 - 17.0 g/dL   HCT 37.4 (L) 39.0 - 52.0 %   MCV 102.2 (H) 78.0 - 100.0 fL   MCH 33.3 26.0 - 34.0 pg   MCHC 32.6 30.0 - 36.0 g/dL   RDW 13.3 11.5 - 15.5 %   Platelets 170 150 -  400 K/uL  Creatinine, serum     Status: Abnormal   Collection Time: 03/31/15 10:30 PM  Result Value Ref Range   Creatinine, Ser 1.85 (H) 0.61 - 1.24 mg/dL   GFR calc non Af Amer 41 (L) >60 mL/min   GFR calc Af Amer 47 (L) >60 mL/min    Comment: (NOTE) The eGFR has been calculated using the CKD EPI equation. This calculation has not been validated in all clinical situations. eGFR's persistently <60 mL/min signify possible Chronic Kidney Disease.   Troponin I     Status: Abnormal   Collection Time: 03/31/15 10:30 PM  Result Value Ref Range   Troponin I 0.22 (H) <0.031 ng/mL    Comment:        PERSISTENTLY INCREASED TROPONIN VALUES IN THE RANGE OF 0.04-0.49 ng/mL CAN BE SEEN IN:       -UNSTABLE ANGINA       -CONGESTIVE HEART FAILURE       -MYOCARDITIS       -CHEST TRAUMA       -ARRYHTHMIAS       -LATE PRESENTING MYOCARDIAL INFARCTION       -COPD   CLINICAL FOLLOW-UP RECOMMENDED.   MRSA PCR Screening     Status: None   Collection Time: 03/31/15 10:32 PM  Result Value Ref Range   MRSA by PCR NEGATIVE NEGATIVE    Comment:        The GeneXpert MRSA Assay (FDA approved for NASAL specimens only), is one component of a comprehensive MRSA colonization surveillance program. It is not intended to diagnose MRSA infection nor to guide or monitor treatment for MRSA infections.   Troponin I     Status: Abnormal   Collection Time: 04/01/15  3:30 AM  Result Value Ref Range   Troponin I 0.35 (H) <0.031 ng/mL    Comment:        PERSISTENTLY INCREASED TROPONIN VALUES IN THE RANGE OF 0.04-0.49 ng/mL CAN BE SEEN IN:       -UNSTABLE ANGINA       -CONGESTIVE HEART FAILURE       -MYOCARDITIS       -CHEST TRAUMA       -  ARRYHTHMIAS       -LATE PRESENTING MYOCARDIAL INFARCTION       -COPD   CLINICAL FOLLOW-UP RECOMMENDED.   Basic metabolic panel     Status: Abnormal   Collection Time: 04/01/15  3:30 AM  Result Value Ref Range   Sodium 144 135 - 145 mmol/L   Potassium 3.0 (L) 3.5 -  5.1 mmol/L    Comment: DELTA CHECK NOTED   Chloride 104 101 - 111 mmol/L   CO2 24 22 - 32 mmol/L   Glucose, Bld 111 (H) 65 - 99 mg/dL   BUN 19 6 - 20 mg/dL   Creatinine, Ser 1.68 (H) 0.61 - 1.24 mg/dL   Calcium 9.1 8.9 - 10.3 mg/dL   GFR calc non Af Amer 46 (L) >60 mL/min   GFR calc Af Amer 53 (L) >60 mL/min    Comment: (NOTE) The eGFR has been calculated using the CKD EPI equation. This calculation has not been validated in all clinical situations. eGFR's persistently <60 mL/min signify possible Chronic Kidney Disease.    Anion gap 16 (H) 5 - 15  Lipid panel     Status: Abnormal   Collection Time: 04/01/15  3:30 AM  Result Value Ref Range   Cholesterol 171 0 - 200 mg/dL   Triglycerides 105 <150 mg/dL   HDL 35 (L) >40 mg/dL   Total CHOL/HDL Ratio 4.9 RATIO   VLDL 21 0 - 40 mg/dL   LDL Cholesterol 115 (H) 0 - 99 mg/dL    Comment:        Total Cholesterol/HDL:CHD Risk Coronary Heart Disease Risk Table                     Men   Women  1/2 Average Risk   3.4   3.3  Average Risk       5.0   4.4  2 X Average Risk   9.6   7.1  3 X Average Risk  23.4   11.0        Use the calculated Patient Ratio above and the CHD Risk Table to determine the patient's CHD Risk.        ATP III CLASSIFICATION (LDL):  <100     mg/dL   Optimal  100-129  mg/dL   Near or Above                    Optimal  130-159  mg/dL   Borderline  160-189  mg/dL   High  >190     mg/dL   Very High   CBC     Status: Abnormal   Collection Time: 04/01/15  3:30 AM  Result Value Ref Range   WBC 6.5 4.0 - 10.5 K/uL   RBC 3.94 (L) 4.22 - 5.81 MIL/uL   Hemoglobin 13.0 13.0 - 17.0 g/dL   HCT 39.5 39.0 - 52.0 %   MCV 100.3 (H) 78.0 - 100.0 fL   MCH 33.0 26.0 - 34.0 pg   MCHC 32.9 30.0 - 36.0 g/dL   RDW 13.1 11.5 - 15.5 %   Platelets 165 150 - 400 K/uL  Troponin I     Status: Abnormal   Collection Time: 04/01/15 10:31 AM  Result Value Ref Range   Troponin I 0.75 (HH) <0.031 ng/mL    Comment:        POSSIBLE  MYOCARDIAL ISCHEMIA. SERIAL TESTING RECOMMENDED. CRITICAL RESULT CALLED TO, READ BACK BY AND VERIFIED WITH: E.BETHEL,RN [id number] 04/01/15 CLARK,S  Imaging: Imaging results have been reviewed  Tele: NSR 90s . I personally reviewed  Assessment/Plan:   1. Active Problems: 2.   Ischemic chest pain ([place]) 3.   Cardiomyopathy, ischemic 4.   Acute ST elevation myocardial infarction (STEMI) ([place]) 5.   ST elevation (STEMI) myocardial infarction involving right coronary artery ([place]) 6.   Time Spent Directly with Patient:  20 minutes  Length of Stay:  LOS: 2 days   POD # 2 Inf STEMI Rx with PCI/ re DES RCA. Low trop peal .75. On DAPT and approp meds. No CP/SOB. Exam benign. K 3.0 repleted yesterday. Repeat labs pending. [facility] has seen. OK to Tx to tele. Ambulate. Prob home in AM.  Quay Burow 04/02/2015, 9:16 AM

## 2015-04-02 NOTE — Progress Notes (Signed)
Report called and pt. Transferred to 3W10 via w/c.

## 2015-04-03 ENCOUNTER — Other Ambulatory Visit: Payer: Self-pay | Admitting: Nurse Practitioner

## 2015-04-03 ENCOUNTER — Encounter (HOSPITAL_COMMUNITY): Payer: Self-pay | Admitting: Nurse Practitioner

## 2015-04-03 DIAGNOSIS — N183 Chronic kidney disease, stage 3 unspecified: Secondary | ICD-10-CM

## 2015-04-03 DIAGNOSIS — I119 Hypertensive heart disease without heart failure: Secondary | ICD-10-CM

## 2015-04-03 DIAGNOSIS — E785 Hyperlipidemia, unspecified: Secondary | ICD-10-CM

## 2015-04-03 DIAGNOSIS — I2510 Atherosclerotic heart disease of native coronary artery without angina pectoris: Secondary | ICD-10-CM | POA: Insufficient documentation

## 2015-04-03 DIAGNOSIS — E1169 Type 2 diabetes mellitus with other specified complication: Secondary | ICD-10-CM

## 2015-04-03 NOTE — Discharge Instructions (Signed)
**  PLEASE REMEMBER TO BRING ALL OF YOUR MEDICATIONS TO EACH OF YOUR FOLLOW-UP OFFICE VISITS.  NO HEAVY LIFTING X 4 WEEKS. NO SEXUAL ACTIVITY X 4 WEEKS. NO DRIVING X 2 WEEKS. NO SOAKING BATHS, HOT TUBS, POOLS, ETC., X 7 DAYS.  Groin Site Care Refer to this sheet in the next few weeks. These instructions provide you with information on caring for yourself after your procedure. Your caregiver may also give you more specific instructions. Your treatment has been planned according to current medical practices, but problems sometimes occur. Call your caregiver if you have any problems or questions after your procedure. HOME CARE INSTRUCTIONS  You may shower 24 hours after the procedure. Remove the bandage (dressing) and gently wash the site with plain soap and water. Gently pat the site dry.   Do not apply powder or lotion to the site.   Do not sit in a bathtub, swimming pool, or whirlpool for 5 to 7 days.   No bending, squatting, or lifting anything over 10 pounds (4.5 kg) as directed by your caregiver.   Inspect the site at least twice daily.  What to expect:  Any bruising will usually fade within 1 to 2 weeks.   Blood that collects in the tissue (hematoma) may be painful to the touch. It should usually decrease in size and tenderness within 1 to 2 weeks.  SEEK IMMEDIATE MEDICAL CARE IF:  You have unusual pain at the groin site or down the affected leg.   You have redness, warmth, swelling, or pain at the groin site.   You have drainage (other than a small amount of blood on the dressing).   You have chills.   You have a fever or persistent symptoms for more than 72 hours.   You have a fever and your symptoms suddenly get worse.   Your leg becomes pale, cool, tingly, or numb.  You have heavy bleeding from the site. Hold pressure on the site. [name]

## 2015-04-03 NOTE — Progress Notes (Signed)
    Subjective:  Denies CP or dyspnea   Objective:  Filed Vitals:   04/02/15 1745 04/02/15 2117 04/03/15 0500 04/03/15 0908  BP: 121/95 103/68 130/94 125/78  Pulse:  84 87   Temp:  98.2 F (36.8 C) 98.1 F (36.7 C)   TempSrc:  Oral Oral   Resp: 18 16 18    Height:      Weight:   142 lb 3.2 oz (64.5 kg)   SpO2: 98% 99% 99% 99%    Intake/Output from previous day:  Intake/Output Summary (Last 24 hours) at 04/03/15 0957 Last data filed at 04/03/15 0857  Gross per 24 hour  Intake    480 ml  Output    350 ml  Net    130 ml    Physical Exam: Physical exam: Well-developed well-nourished in no acute distress.  Skin is warm and dry.  HEENT is normal.  Neck is supple.  Chest is clear to auscultation with normal expansion.  Cardiovascular exam is regular rate and rhythm.  Abdominal exam nontender or distended. No masses palpated. R groin with no hematoma or bruit Extremities show no edema. neuro grossly intact    Lab Results: Basic Metabolic Panel:  Recent Labs  04/01/15 0330 04/02/15 1029  NA 144 141  K 3.0* 4.6  CL 104 106  CO2 24 25  GLUCOSE 111* 170*  BUN 19 29*  CREATININE 1.68* 2.03*  CALCIUM 9.1 9.3   CBC:  Recent Labs  03/31/15 1925 03/31/15 2230 04/01/15 0330  WBC 5.7 6.2 6.5  NEUTROABS 4.1  --   --   HGB 13.7 12.2* 13.0  HCT 42.1 37.4* 39.5  MCV 103.7* 102.2* 100.3*  PLT 128* 170 165   Cardiac Enzymes:  Recent Labs  03/31/15 2230 04/01/15 0330 04/01/15 1031  TROPONINI 0.22* 0.35* 0.75*     Assessment/Plan:  1 status post non-ST elevation myocardial infarction-status post PCI of RCA-continue aspirin, brilinta and statin.  2 ischemic cardiomyopathy-continue beta blocker, entresto and hydralazine/nitrates. Followed in CHF clinic. 3 chronic stage 3 kidney disease-recheck BUN and Cr Monday 3/20. 4 hypertension-continue preadmission medications. 5 hyperlipidemia-continue statin. 6 history of ICD-followed by electrophysiology. 7  chronic systolic congestive heart failure-resume diuretics discharge. Patient can be discharged from a cardiac standpoint and follow-up with Dr. Claiborne Billings in 2-4 weeks. > 30 min PA and physician time D2  Kirk Ruths 04/03/2015, 9:57 AM

## 2015-04-03 NOTE — Progress Notes (Signed)
Patient discharge paperwork gone over with patient in detail. All questions answered to patients satisfaction.Telemetry discontinued, IV removed intact. Patient discharged to home with family by way of wheelchair.

## 2015-04-03 NOTE — Discharge Summary (Signed)
Discharge Summary    Patient ID: Marvin Martin,  MRN: [id number], DOB/AGE: 50/23/1967 50 y.o.  Admit date: 03/31/2015 Discharge date: 04/03/2015  Primary Care Provider: No PCP Per Patient Primary Cardiologist: D. Bensimhon, MD   Discharge Diagnoses    Principal Problem:   ST elevation (STEMI) myocardial infarction involving right coronary artery (HCC)  **s/p PTCA to prox/mid RCA and DES to the distal RCA this admission.  Active Problems:   CAD in native artery   Chronic systolic heart failure (HCC)   CKD (chronic kidney disease) stage 3, GFR 30-59 ml/min   Cardiomyopathy, ischemic   Hypertensive heart disease   Hyperlipidemia  Allergies No Known Allergies  Diagnostic Studies/Procedures    Cardiac Catheterization and Percutaneous Coronary Intervention 3.15.2017  Coronary Findings     Dominance: Right    Left Anterior Descending   . Second Engineer, production   . Ost 2nd Diag to 2nd Diag lesion, 0% stenosed. Previously placed Ost 2nd Diag to 2nd Diag stent (unknown type) is patent.      Ramus Intermedius   . Ost Ramus to Ramus lesion, 0% stenosed. Previously placed Ost Ramus to Ramus stent (unknown type) is patent.      Left Circumflex   . Prox Cx to Mid Cx lesion, 0% stenosed. Previously placed Prox Cx to Mid Cx stent (unknown type) is patent.      Right Coronary Artery   . Prox RCA lesion, 95% stenosed. The lesion was previously treated with a stent (unknown type).   . PCI: The ISR in the proximal RCA was successfully treated using cutting balloon angioplasty.  . There is a 10% residual stenosis post intervention.     . Mid RCA lesion, 50% stenosed.   [name] PCI: An unspecified stent was placed.  . There is no residual stenosis post intervention.     . Dist RCA-1 lesion, 100% stenosed. The lesion was previously treated with a stent (unknown type).   . Dist RCA-2 lesion, 100% stenosed. The lesion was previously treated with a stent (unknown type).   . Right  Posterior Descending Artery   . Ost RPDA to RPDA lesion, 90% stenosed.   [name] PCI: the distal RCA/ost PDA was successfully stented using a 2.5 x 16 mm Synergy DES.  [name] There is no residual stenosis post intervention.         Before     After _____________   History of Present Illness     50 y/o ? with the above complex PMH. His cardiac hx dates back to ~ 2004, when he says he suffered his first of 4 MI's. Over the years, he has had multiple procedures and reportedly has had 17 stents with recurrent ISR. He also has LV dysfxn and underwent ICD placement in 2007 with subsequent gen change in 2012. His last cath was in 2016, @ which time he says a stent was placed.   He moved to [facility] in 2016 and established care in the CHF clinic and also with EP. He had a CPX study in Dec 2016, which showed mild to mod HF limitation. A myoview was performed in late Jan, which was low risk. He has been doing well w/o significant limitations but today developed substernal chest pressure @ approx 1 PM on 03/31/2015.  Chest pain persisted and worsened, prompting him to call EMS. Upon EMS arrival, ECG showed anterior J-point elevation with inferior reciprocal changes. A code STEMI was called. Patient was taken emergently to the cardiac  catheterization laboratory.  Hospital Course     Consultants: None   Patient underwent emergent diagnostic catheterization revealing patent diagonal, ramus, and circumflex stents. There was a 95% proximal stenosis within a previously placed right coronary artery stent as well as a 50% mid stenosis. There was also a new 95% stenosis in the distal RCA. Previously placed stents in the posterolateral branch were occluded and these were felt to be chronic total occlusions. The proximal RCA was treated with cutting balloon angioplasty, as was the mid RCA. The distal RCA was successfully treated using a 2.5 x 16 mm Synergy drug-eluting stent. Patient was hypertensive throughout the procedure  and required IV nitroglycerin. By the end of the procedure, he was coughing and felt to be volume overloaded. He was treated with IV Lasix.  He was subsequently admitted to the coronary intensive care unit. In the setting of an admission creatinine of 2.03, he held his home dose of diuretics and also Entresto, and spironolactone. Post procedure, renal function stabilized and Entresto and spironolactone were resumed. He was also maintained on aspirin, brilinta, carvedilol, and high potency statin therapy. He was able to be transferred out to the floor March 16 and has been ambulating with cardiac rehabilitation without any recurrent symptoms or limitations. He will be discharged home today in good condition. We arrange for follow-up basic metabolic panel for Monday, March 20, and he will follow-up in heart failure clinic as scheduled on March 27. _____________  Discharge Vitals Blood pressure 125/78, pulse 87, temperature 98.1 F (36.7 C), temperature source Oral, resp. rate 18, height 5\' 7"  (1.702 m), weight 142 lb 3.2 oz (64.5 kg), SpO2 99 %.  Filed Weights   04/01/15 0423 04/02/15 0500 04/03/15 0500  Weight: 142 lb 6.7 oz (64.6 kg) 142 lb 3.2 oz (64.5 kg) 142 lb 3.2 oz (64.5 kg)    Labs & Radiologic Studies     CBC  Recent Labs  03/31/15 1925 03/31/15 2230 04/01/15 0330  WBC 5.7 6.2 6.5  NEUTROABS 4.1  --   --   HGB 13.7 12.2* 13.0  HCT 42.1 37.4* 39.5  MCV 103.7* 102.2* 100.3*  PLT 128* 170 [id number]   Basic Metabolic Panel  Recent Labs  04/01/15 0330 04/02/15 1029  NA 144 141  K 3.0* 4.6  CL 104 106  CO2 24 25  GLUCOSE 111* 170*  BUN 19 29*  CREATININE 1.68* 2.03*  CALCIUM 9.1 9.3    Cardiac Enzymes  Recent Labs  03/31/15 2230 04/01/15 0330 04/01/15 1031  TROPONINI 0.22* 0.35* 0.75*   Fasting Lipid Panel  Recent Labs  04/01/15 0330  CHOL 171  HDL 35*  LDLCALC 115*  TRIG 105  CHOLHDL 4.9    Dg Chest Port 1 View  03/31/2015  CLINICAL DATA:  50 year old  male with mid chest pain beginning earlier today progressively worsening. History of coronary artery disease status post multiple stent placement and multiple myocardial infarctions. Congestive heart failure. EXAM: PORTABLE CHEST 1 VIEW COMPARISON:  No priors. FINDINGS: Lung volumes are normal. No consolidative airspace disease. No pleural effusions. No pneumothorax. No suspicious appearing pulmonary nodules or masses. No evidence of pulmonary edema. Heart size is mildly enlarged. Upper mediastinal contours are within normal limits. Atherosclerosis in the thoracic aorta. Coronary artery stents are noted. Left sided pacemaker/AICD with lead tips projecting over the expected location of the right atrium and right ventricular apex. IMPRESSION: 1. No radiographic evidence of acute cardiopulmonary disease. 2. Mild cardiomegaly. 3. Atherosclerosis. Electronically Signed  By: Vinnie Langton M.D.   On: 03/31/2015 19:57    Disposition   Pt is being discharged home today in good condition.  Follow-up Plans & Appointments    Follow-up Information    Follow up with Glori Bickers, MD On 04/12/2015.   Specialty:  Cardiology   Why:  9 AM   Contact information:   [address] [place] [address] [phone]       Follow up with [place] On 04/05/2015.   Why:  Arrive some time between 8:30a and 3p for blood chemistry.   Contact information:   [place] [phone]     Discharge Instructions    Amb Referral to Cardiac Rehabilitation    Complete by:  As directed   Diagnosis:   Myocardial Infarction PCI             Discharge Medications   Current Discharge Medication List    CONTINUE these medications which have NOT CHANGED   Details  allopurinol (ZYLOPRIM) 100 MG tablet Take 100 mg by mouth 2 (two) times daily.     amLODipine (NORVASC) 10 MG tablet Take 10 mg  by mouth daily.    aspirin 81 MG tablet Take 81 mg by mouth daily.    calcitRIOL (ROCALTROL) 0.25 MCG capsule Take 0.25 mcg by mouth every other day. Take on Monday, Wednesday and Friday    carvedilol (COREG) 25 MG tablet Take 25 mg by mouth 2 (two) times daily with a meal.    cholecalciferol (VITAMIN D) 1000 UNITS tablet Take 1,000 Units by mouth daily.    cloNIDine (CATAPRES) 0.1 MG tablet Take 1 tablet (0.1 mg total) by mouth daily. Qty: 60 tablet    ezetimibe (ZETIA) 10 MG tablet Take 10 mg by mouth daily.    furosemide (LASIX) 40 MG tablet Take 40 mg by mouth daily.     hydrALAZINE (APRESOLINE) 50 MG tablet Take 1 tablet (50 mg total) by mouth 3 (three) times daily. Qty: 90 tablet, Refills: 3    isosorbide dinitrate (ISORDIL) 20 MG tablet Take 1 tablet (20 mg total) by mouth 3 (three) times daily. Qty: 90 tablet, Refills: 3    omega-3 acid ethyl esters (LOVAZA) 1 G capsule Take 1 g by mouth daily.     ondansetron (ZOFRAN) 4 MG tablet Take 4 mg by mouth every 8 (eight) hours as needed for nausea or vomiting. Reported on 01/07/2015    PANTOPRAZOLE SODIUM PO Take 40 mg by mouth as directed.     rosuvastatin (CRESTOR) 40 MG tablet Take 40 mg by mouth daily.    sacubitril-valsartan (ENTRESTO) 97-103 MG Take 1 tablet by mouth 2 (two) times daily. Qty: 60 tablet, Refills: 6    spironolactone (ALDACTONE) 25 MG tablet Take 25 mg by mouth daily.    ticagrelor (BRILINTA) 90 MG TABS tablet Take 90 mg by mouth 2 (two) times daily.     nitroGLYCERIN (NITROSTAT) 0.4 MG SL tablet Place 0.4 mg under the tongue every 5 (five) minutes as needed for chest pain. Reported on 02/09/2015      STOP taking these medications     potassium chloride (K-DUR,KLOR-CON) 10 MEQ tablet          Aspirin prescribed at discharge?  Yes High Intensity Statin Prescribed? (Lipitor 40-80mg  or Crestor 20-40mg ): Yes Beta Blocker Prescribed? Yes For EF 45% or less, Was ACEI/ARB Prescribed? Yes ADP Receptor  Inhibitor Prescribed? (i.e.  Plavix etc.-Includes Medically Managed Patients): Yes For EF <40%, Aldosterone Inhibitor Prescribed? Yes Was EF assessed during THIS hospitalization? No: Known from prior recent evaluation. Was Cardiac Rehab II ordered? (Included Medically managed Patients): Yes   Outstanding Labs/Studies   BMET on 04/05/2015.  Duration of Discharge Encounter   Greater than 30 minutes including physician time.  Signed, Murray Hodgkins NP 04/03/2015, 10:47 AM

## 2015-04-05 ENCOUNTER — Other Ambulatory Visit (INDEPENDENT_AMBULATORY_CARE_PROVIDER_SITE_OTHER): Payer: Medicare Other | Admitting: *Deleted

## 2015-04-05 DIAGNOSIS — N183 Chronic kidney disease, stage 3 unspecified: Secondary | ICD-10-CM

## 2015-04-05 LAB — BASIC METABOLIC PANEL
BUN: 40 mg/dL — ABNORMAL HIGH (ref 7–25)
CO2: 27 mmol/L (ref 20–31)
Calcium: 9.4 mg/dL (ref 8.6–10.3)
Chloride: 102 mmol/L (ref 98–110)
Creat: 2.35 mg/dL — ABNORMAL HIGH (ref 0.70–1.33)
GLUCOSE: 131 mg/dL — AB (ref 65–99)
POTASSIUM: 5.1 mmol/L (ref 3.5–5.3)
SODIUM: 139 mmol/L (ref 135–146)

## 2015-04-09 ENCOUNTER — Ambulatory Visit (HOSPITAL_COMMUNITY)
Admission: RE | Admit: 2015-04-09 | Discharge: 2015-04-09 | Disposition: A | Discharge status: Home / Self Care | Payer: Medicare Other | Source: Ambulatory Visit | Attending: Cardiology | Admitting: Cardiology

## 2015-04-09 DIAGNOSIS — I5042 Chronic combined systolic (congestive) and diastolic (congestive) heart failure: Secondary | ICD-10-CM

## 2015-04-09 LAB — BASIC METABOLIC PANEL
ANION GAP: 11 (ref 5–15)
BUN: 35 mg/dL — AB (ref 6–20)
CALCIUM: 9.4 mg/dL (ref 8.9–10.3)
CO2: 22 mmol/L (ref 22–32)
Chloride: 108 mmol/L (ref 101–111)
Creatinine, Ser: 2.35 mg/dL — ABNORMAL HIGH (ref 0.61–1.24)
GFR calc Af Amer: 35 mL/min — ABNORMAL LOW (ref >60)
GFR, EST NON AFRICAN AMERICAN: 31 mL/min — AB (ref >60)
GLUCOSE: 141 mg/dL — AB (ref 65–99)
Potassium: 4.5 mmol/L (ref 3.5–5.1)
Sodium: 141 mmol/L (ref 135–145)

## 2015-04-12 ENCOUNTER — Ambulatory Visit (HOSPITAL_COMMUNITY)
Admission: RE | Admit: 2015-04-12 | Discharge: 2015-04-12 | Disposition: A | Discharge status: Home / Self Care | Payer: Medicare Other | Source: Ambulatory Visit | Attending: Internal Medicine | Admitting: Internal Medicine

## 2015-04-12 ENCOUNTER — Encounter (HOSPITAL_COMMUNITY): Payer: Self-pay | Admitting: Internal Medicine

## 2015-04-12 VITALS — BP 130/78 | HR 79 | Wt 146.0 lb

## 2015-04-12 DIAGNOSIS — E785 Hyperlipidemia, unspecified: Secondary | ICD-10-CM | POA: Diagnosis not present

## 2015-04-12 DIAGNOSIS — I2111 ST elevation (STEMI) myocardial infarction involving right coronary artery: Secondary | ICD-10-CM | POA: Diagnosis not present

## 2015-04-12 DIAGNOSIS — Z8249 Family history of ischemic heart disease and other diseases of the circulatory system: Secondary | ICD-10-CM | POA: Diagnosis not present

## 2015-04-12 DIAGNOSIS — I130 Hypertensive heart and chronic kidney disease with heart failure and stage 1 through stage 4 chronic kidney disease, or unspecified chronic kidney disease: Secondary | ICD-10-CM | POA: Insufficient documentation

## 2015-04-12 DIAGNOSIS — I5022 Chronic systolic (congestive) heart failure: Secondary | ICD-10-CM | POA: Diagnosis not present

## 2015-04-12 DIAGNOSIS — I252 Old myocardial infarction: Secondary | ICD-10-CM | POA: Diagnosis not present

## 2015-04-12 DIAGNOSIS — I255 Ischemic cardiomyopathy: Secondary | ICD-10-CM | POA: Diagnosis not present

## 2015-04-12 DIAGNOSIS — Z7982 Long term (current) use of aspirin: Secondary | ICD-10-CM | POA: Diagnosis not present

## 2015-04-12 DIAGNOSIS — N183 Chronic kidney disease, stage 3 (moderate): Secondary | ICD-10-CM | POA: Diagnosis not present

## 2015-04-12 DIAGNOSIS — Z955 Presence of coronary angioplasty implant and graft: Secondary | ICD-10-CM | POA: Insufficient documentation

## 2015-04-12 DIAGNOSIS — M109 Gout, unspecified: Secondary | ICD-10-CM | POA: Insufficient documentation

## 2015-04-12 DIAGNOSIS — I2510 Atherosclerotic heart disease of native coronary artery without angina pectoris: Secondary | ICD-10-CM

## 2015-04-12 DIAGNOSIS — Z79899 Other long term (current) drug therapy: Secondary | ICD-10-CM | POA: Insufficient documentation

## 2015-04-12 LAB — CBC
HEMATOCRIT: 42.6 % (ref 39.0–52.0)
HEMOGLOBIN: 14.1 g/dL (ref 13.0–17.0)
MCH: 34.3 pg — ABNORMAL HIGH (ref 26.0–34.0)
MCHC: 33.1 g/dL (ref 30.0–36.0)
MCV: 103.6 fL — AB (ref 78.0–100.0)
Platelets: 244 10*3/uL (ref 150–400)
RBC: 4.11 MIL/uL — AB (ref 4.22–5.81)
RDW: 13.2 % (ref 11.5–15.5)
WBC: 4.1 10*3/uL (ref 4.0–10.5)

## 2015-04-12 LAB — BASIC METABOLIC PANEL
Anion gap: 12 (ref 5–15)
BUN: 36 mg/dL — ABNORMAL HIGH (ref 6–20)
CHLORIDE: 112 mmol/L — AB (ref 101–111)
CO2: 16 mmol/L — ABNORMAL LOW (ref 22–32)
Calcium: 9.3 mg/dL (ref 8.9–10.3)
Creatinine, Ser: 2.43 mg/dL — ABNORMAL HIGH (ref 0.61–1.24)
GFR calc non Af Amer: 29 mL/min — ABNORMAL LOW (ref >60)
GFR, EST AFRICAN AMERICAN: 34 mL/min — AB (ref >60)
Glucose, Bld: 170 mg/dL — ABNORMAL HIGH (ref 65–99)
POTASSIUM: 4.6 mmol/L (ref 3.5–5.1)
SODIUM: 140 mmol/L (ref 135–145)

## 2015-04-12 NOTE — Patient Instructions (Signed)
Stop Clonidine  Labs today  You have been referred to [place] have been referred to Cardiac Rehab, they will call you to schedule  Your physician recommends that you schedule a follow-up appointment in: 4-6 weeks

## 2015-04-12 NOTE — Addendum Note (Signed)
Encounter addended by: Scarlette Calico, RN on: 04/12/2015  9:48 AM<BR>     Documentation filed: Medications, Visit Diagnoses, Dx Association, Patient Instructions Section, Orders

## 2015-04-12 NOTE — Progress Notes (Signed)
Patient ID: Marvin Martin, male   DOB: 05-06-1965, 50 y.o.   MRN: [id number]   [facility] CLINIC CONSULT NOTE  Referring Physician: Dr Laney Pastor Primary Care: Dr Laney Pastor Primary Cardiologist:  HPI: Marvin Martin is a 50 year old male with history of CAD, ICM, MI X4 (presents with chest burning radiating to L shoulder) with 18 stents, chronic systolic HF with EF [id number] s/p St. Jude ICD, HTN, gout, and hyperlipidemia.  He just relocated to [facility] from [place]. His medical care was provided by [facility]. He has struggled with in-stent restenosis.   Admitted in March 2017 with inferior STEMI. Cath showed Patent left coronary system with a normal left main, large LAD with a patent stent in the diagonal vessel; patent stent in the ramus immediate vessel, and patent stent in the left circumflex coronary artery.  Tandem stents in the proximal extending to the mid RCA with focal 95% in-stent restenosis proximally and 40-50% in-stent restenosis diffusely throughout the stented region with evidence for a 90% stenosis at the acute margin extending into the PDA vessel with total occlusion of the continuation branch of the RCA and probable chronic occlusion of tandem stents in this continuation branch PLA vessel. Under PCI only of proximal lesion and DEC to distal lesion.   Overall feeling ok.. Gets around ok. Able to do all ADLs. Says he feels he is weak and wants to gain weight. No chest pressure. Denies PND/Orthopnea. No edema, orthopnea or PND.Weight at home 148 pounds. Takes medications daily.    Echo 01/30/15 EF 20-25% RV normal  CPX 01/15/15 FVC 2.67 (65%)    FEV1 2.18 (64%)     FEV1/FVC 81 (100%)     MVV 105 (73%)  Resting HR: 90 Peak HR: 128  (75% age predicted max HR) BP rest: 130/90 BP peak: 182/104 Peak VO2: 20.3 (56.3% predicted peak VO2) VE/VCO2 slope: 26.8 OUES: 1.52 Peak RER: 1.21 Ventilatory Threshold: 12.1 (33.5% predicted or measured peak VO2) Peak RR  39 Peak Ventilation: 46.1 VE/MVV: 43.9% PETCO2 at peak: 43 O2pulse: 11  (78.6% predicted O2pulse)  Labs:  12/16: K 4.0 Creatinine 1.7 Hgb 13.2 MCV 107 (denies ETOH)   SH: Separated from his wife. Has 4 children. 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         Father deceased but he is unsure of the cause. He had hyperlipidemia.         Sister: Heart disease  [facility]  [address] [place] [address] [phone]   Past Medical History  Diagnosis Date  . Chronic systolic CHF (congestive heart failure) ([place])     a. 01/2015 Echo: EF 20-25%, antlat, inflat AK.  [name] Hyperlipidemia   . Coronary artery disease     a. s/p MI x 4;  b. Reported h/o 17 stents with recurrent ISR;  c. 02/2014 Cath ([place]): PCI to unknown vessel;  d. 01/2015 MV: EF 18%, large scar, no ischemia; e. 03/2015 PCI: LM nl, LAD nl, D2 patent stent, RI patent stent, LCX patent stents p/m, RCA 95p ISR (PTCA), 40m, 100d into RPL CTO, RPDA 90 (2.5x16 Synergy DES).  . Hypertensive heart disease   . Ischemic cardiomyopathy     a. s/p SJM ICD 2007 w/ gen change in 2012; b. 12/2014 CPX Test: mild to mod HF limitation w/ pVO2 20.3 and nl slope;  c. 01/2015 Echo: EF 20-25%.  . CKD (chronic kidney disease), stage III  Current Outpatient Prescriptions  Medication Sig Dispense Refill  . allopurinol (ZYLOPRIM) 100 MG tablet Take 100 mg by mouth 2 (two) times daily.     [name] amLODipine (NORVASC) 10 MG tablet Take 10 mg by mouth daily.    [name] aspirin 81 MG tablet Take 81 mg by mouth daily.    . calcitRIOL (ROCALTROL) 0.25 MCG capsule Take 0.25 mcg by mouth every other day. Take on Monday, Wednesday and Friday    . carvedilol (COREG) 25 MG tablet Take 25 mg by mouth 2 (two) times daily with a meal.    . cholecalciferol (VITAMIN D) 1000 UNITS tablet Take 1,000 Units by mouth daily.    . cloNIDine (CATAPRES) 0.1 MG tablet Take 1 tablet (0.1 mg total) by mouth daily. 60 tablet   .  ezetimibe (ZETIA) 10 MG tablet Take 10 mg by mouth daily.    . furosemide (LASIX) 40 MG tablet Take 40 mg by mouth daily.     . hydrALAZINE (APRESOLINE) 50 MG tablet Take 1 tablet (50 mg total) by mouth 3 (three) times daily. 90 tablet 3  . isosorbide dinitrate (ISORDIL) 20 MG tablet Take 1 tablet (20 mg total) by mouth 3 (three) times daily. 90 tablet 3  . omega-3 acid ethyl esters (LOVAZA) 1 G capsule Take 1 g by mouth daily.     . ondansetron (ZOFRAN) 4 MG tablet Take 4 mg by mouth every 8 (eight) hours as needed for nausea or vomiting. Reported on 01/07/2015    . PANTOPRAZOLE SODIUM PO Take 40 mg by mouth as directed.     . rosuvastatin (CRESTOR) 40 MG tablet Take 40 mg by mouth daily.    . sacubitril-valsartan (ENTRESTO) 97-103 MG Take 1 tablet by mouth 2 (two) times daily. 60 tablet 6  . spironolactone (ALDACTONE) 25 MG tablet Take 25 mg by mouth daily.    . ticagrelor (BRILINTA) 90 MG TABS tablet Take 90 mg by mouth 2 (two) times daily.     . nitroGLYCERIN (NITROSTAT) 0.4 MG SL tablet Place 0.4 mg under the tongue every 5 (five) minutes as needed for chest pain. Reported on 04/12/2015     No current facility-administered medications for this encounter.    No Known Allergies    Social History   Social History  . Marital Status: Legally Separated    Spouse Name: N/A  . Number of Children: N/A  . Years of Education: N/A   Occupational History  . Not on file.   Social History Main Topics  . Smoking status: Never Smoker   . Smokeless tobacco: Not on file  . Alcohol Use: No  . Drug Use: No  . Sexual Activity: No   Other Topics Concern  . Not on file   Social History Narrative   Moved to [place] from [place] in 2016.  Lives alone in [place].      Family History  Problem Relation Age of Onset  . Cancer Mother     died when pt was 76.  She had a h/o heart dzs as well.  . Hypertension Mother   . Hyperlipidemia Father     Father died when pt was age 57 - he believes that he had a  cardiac hx.  [name] CAD Sister     Danley Danker Vitals:   04/12/15 0918  BP: 130/78  Pulse: 79  Weight: 146 lb (66.225 kg)  SpO2: 98%    PHYSICAL EXAM: General:  Well appearing. No respiratory difficulty HEENT: normal Neck: supple.  no JVD. Carotids 2+ bilat; no bruits. No lymphadenopathy or thryomegaly appreciated. Cor: PMI laterally  displaced. Regular rate & rhythm. No rubs, gallops or murmurs. Lungs: clear Abdomen: soft, nontender, nondistended. No hepatosplenomegaly. No bruits or masses. Good bowel sounds. Extremities: no cyanosis, clubbing, rash, edema Neuro: alert & oriented x 3, cranial nerves grossly intact. moves all 4 extremities w/o difficulty. Affect pleasant.    ASSESSMENT & PLAN:  1. Chronic Systolic Heart Failure - due to iCM. EF 20-25% on echo 1/17. RV ok. S/p ST Jude ICD  -CPX test reviewed shows mild to moderate HF limitation with pVO2 20.3 and normal slope. - NYHA II-III. Volume status stable. Continue lasix 40 mg daily. Continue spiro 25 mg daily. -Continue carvedilol 25 mg twice a day.  -Continue entresto 97-103 mg twice a day -Continue hydralazine 50 mg tid and isordil 20 mg tid.  -Refer to Cardiac rehab 2. CAD- MI X5. Now with 18 stents M - s/p recent Inferior STEMI 3/17 due to ISR. - Continue ASA, statin, ticagrelor (lifelong) and b-blocker - He qualifies for CR. Will refer 3. HTN - BP improved. With low EF will stop clonidine. Increase hydralazine as needed.  4. Hyperlipidemia - Continue Crestor 40 and Zetia 10. LDL on admit was 115. Given recurrent events absolutely needs LDL < 70. Will refer to lipid clinic for PCSK-9 injections.  5. CKD stage III  -had AKI in hospital after cath. Recheck today.  Bensimhon, Daniel,MD 9:29 AM

## 2015-04-15 ENCOUNTER — Other Ambulatory Visit (HOSPITAL_COMMUNITY): Payer: Self-pay | Admitting: Pharmacist

## 2015-04-15 ENCOUNTER — Ambulatory Visit (INDEPENDENT_AMBULATORY_CARE_PROVIDER_SITE_OTHER): Payer: Medicare Other | Admitting: Pharmacist

## 2015-04-15 DIAGNOSIS — I2510 Atherosclerotic heart disease of native coronary artery without angina pectoris: Secondary | ICD-10-CM

## 2015-04-15 DIAGNOSIS — E785 Hyperlipidemia, unspecified: Secondary | ICD-10-CM

## 2015-04-15 MED ORDER — ALIROCUMAB 75 MG/ML ~~LOC~~ SOPN: 1.0000 "pen " | PEN_INJECTOR | SUBCUTANEOUS | Status: DC

## 2015-04-15 MED ORDER — TICAGRELOR 90 MG PO TABS: 90.0000 mg | ORAL_TABLET | Freq: Two times a day (BID) | ORAL | Status: DC

## 2015-04-15 NOTE — Progress Notes (Signed)
Patient ID: Marvin Martin                 DOB: 30-Oct-1965, 50 yo                         MRN: [id number]     HPI: Marvin Martin is a very pleasant 50 y.o. male patient referred to lipid clinic by Dr. Haroldine Laws. PMH is significant for CAD, ICM, MI x5 (most recent in March 2017) with 18 stents, HFrEF with LVEF 20-25% s/p ICD, HTN, gout, and HLD. He recently moved to [facility] from [place]. He was referred to clinic to discuss PCSK9i therapy.  Patient reports adherence with his Crestor and Zetia, he has no problems tolerating either. He has Medicaid and Medicare and believes that his Medicaid has transferred to [facility] although he has not received a new card yet.  Current Medications: Crestor 40mg  daily, Zetia 10mg  daily Risk Factors: MI x5, coronary stents x18 LDL goal: 70mg /dL  Family History: Mother had cancer, HTN and heart disease. Father had HLD and cardiac disease. Sister with CAD.  Social History: Pt reports that he has never smoked, does not drink alcohol, and does not use illicit drugs.  Labs: 03/2015: TC 171, TG 105, HDL 35, LDL 115 (Crestor 40mg  daily, Zetia 10mg  daily)  Past Medical History  Diagnosis Date  . Chronic systolic CHF (congestive heart failure) ([place])     a. 01/2015 Echo: EF 20-25%, antlat, inflat AK.  [name] Hyperlipidemia   . Coronary artery disease     a. s/p MI x 4;  b. Reported h/o 17 stents with recurrent ISR;  c. 02/2014 Cath ([place]): PCI to unknown vessel;  d. 01/2015 MV: EF 18%, large scar, no ischemia; e. 03/2015 PCI: LM nl, LAD nl, D2 patent stent, RI patent stent, LCX patent stents p/m, RCA 95p ISR (PTCA), 21m, 100d into RPL CTO, RPDA 90 (2.5x16 Synergy DES).  . Hypertensive heart disease   . Ischemic cardiomyopathy     a. s/p SJM ICD 2007 w/ gen change in 2012; b. 12/2014 CPX Test: mild to mod HF limitation w/ pVO2 20.3 and nl slope;  c. 01/2015 Echo: EF 20-25%.  . CKD (chronic kidney disease), stage III     Current Outpatient Prescriptions on File Prior to Visit    Medication Sig Dispense Refill  . allopurinol (ZYLOPRIM) 100 MG tablet Take 100 mg by mouth 2 (two) times daily.     [name] amLODipine (NORVASC) 10 MG tablet Take 10 mg by mouth daily.    [name] aspirin 81 MG tablet Take 81 mg by mouth daily.    . calcitRIOL (ROCALTROL) 0.25 MCG capsule Take 0.25 mcg by mouth every other day. Take on Monday, Wednesday and Friday    . carvedilol (COREG) 25 MG tablet Take 25 mg by mouth 2 (two) times daily with a meal.    . cholecalciferol (VITAMIN D) 1000 UNITS tablet Take 1,000 Units by mouth daily.    [name] ezetimibe (ZETIA) 10 MG tablet Take 10 mg by mouth daily.    . furosemide (LASIX) 40 MG tablet Take 40 mg by mouth daily.     . hydrALAZINE (APRESOLINE) 50 MG tablet Take 1 tablet (50 mg total) by mouth 3 (three) times daily. 90 tablet 3  . isosorbide dinitrate (ISORDIL) 20 MG tablet Take 1 tablet (20 mg total) by mouth 3 (three) times daily. 90 tablet 3  . nitroGLYCERIN (NITROSTAT) 0.4 MG SL tablet Place 0.4 mg under  the tongue every 5 (five) minutes as needed for chest pain. Reported on 04/12/2015    . omega-3 acid ethyl esters (LOVAZA) 1 G capsule Take 1 g by mouth daily.     . ondansetron (ZOFRAN) 4 MG tablet Take 4 mg by mouth every 8 (eight) hours as needed for nausea or vomiting. Reported on 01/07/2015    . PANTOPRAZOLE SODIUM PO Take 40 mg by mouth as directed.     . rosuvastatin (CRESTOR) 40 MG tablet Take 40 mg by mouth daily.    . sacubitril-valsartan (ENTRESTO) 97-103 MG Take 1 tablet by mouth 2 (two) times daily. 60 tablet 6  . spironolactone (ALDACTONE) 25 MG tablet Take 25 mg by mouth daily.    . ticagrelor (BRILINTA) 90 MG TABS tablet Take 90 mg by mouth 2 (two) times daily.      No current facility-administered medications on file prior to visit.    No Known Allergies  Assessment/Plan:  1. Hyperlipidemia - LDL is 115 on Crestor 40mg  and Zetia 10mg , goal 70mg /dL given ASCVD with 5 previous MIs and 18 stents. Will start paperwork for PCSK9i therapy.  Pt has been educated on expected benefits, side effects, and injection technique.  2. Patient has a history of in-stent rethrombosis. He ran out of his Brilinta 4 days ago. Provided him with a week of samples and had him take a dose in clinic. Routed message to HF clinic for refill since pt is followed by Dr. Haroldine Laws, refill has been sent in by Doroteo Bradford, PharmD. Pt needs lifelong Brilinta therapy per Dr. Haroldine Laws.   Megan E. Supple, PharmD, [place] [id number] [address] [place], [place] [address] Phone: [phone]; Fax: [phone] 04/15/2015 10:53 AM

## 2015-04-26 ENCOUNTER — Other Ambulatory Visit (HOSPITAL_COMMUNITY): Payer: Self-pay | Admitting: Internal Medicine

## 2015-04-28 ENCOUNTER — Other Ambulatory Visit: Payer: Self-pay | Admitting: Pharmacist

## 2015-04-28 DIAGNOSIS — E785 Hyperlipidemia, unspecified: Secondary | ICD-10-CM

## 2015-05-24 ENCOUNTER — Encounter (HOSPITAL_COMMUNITY): Payer: Self-pay

## 2015-05-24 ENCOUNTER — Other Ambulatory Visit (HOSPITAL_COMMUNITY): Payer: Self-pay | Admitting: Internal Medicine

## 2015-05-24 ENCOUNTER — Inpatient Hospital Stay (HOSPITAL_COMMUNITY): Admission: RE | Admit: 2015-05-24 | Payer: Medicare Other | Source: Ambulatory Visit | Admitting: Internal Medicine

## 2015-05-24 ENCOUNTER — Emergency Department (HOSPITAL_COMMUNITY): Payer: Medicare Other

## 2015-05-24 ENCOUNTER — Inpatient Hospital Stay (HOSPITAL_COMMUNITY)
Admission: EM | Admit: 2015-05-24 | Discharge: 2015-05-28 | DRG: 286 | Disposition: A | Discharge status: Home / Self Care | Payer: Medicare Other | Attending: Family Medicine | Admitting: Family Medicine

## 2015-05-24 DIAGNOSIS — I110 Hypertensive heart disease with heart failure: Secondary | ICD-10-CM | POA: Diagnosis not present

## 2015-05-24 DIAGNOSIS — I200 Unstable angina: Secondary | ICD-10-CM

## 2015-05-24 DIAGNOSIS — E785 Hyperlipidemia, unspecified: Secondary | ICD-10-CM | POA: Diagnosis present

## 2015-05-24 DIAGNOSIS — I169 Hypertensive crisis, unspecified: Secondary | ICD-10-CM | POA: Diagnosis not present

## 2015-05-24 DIAGNOSIS — N183 Chronic kidney disease, stage 3 (moderate): Secondary | ICD-10-CM | POA: Diagnosis present

## 2015-05-24 DIAGNOSIS — I209 Angina pectoris, unspecified: Secondary | ICD-10-CM | POA: Insufficient documentation

## 2015-05-24 DIAGNOSIS — I2510 Atherosclerotic heart disease of native coronary artery without angina pectoris: Secondary | ICD-10-CM | POA: Diagnosis present

## 2015-05-24 DIAGNOSIS — Z7902 Long term (current) use of antithrombotics/antiplatelets: Secondary | ICD-10-CM

## 2015-05-24 DIAGNOSIS — R0789 Other chest pain: Secondary | ICD-10-CM | POA: Diagnosis not present

## 2015-05-24 DIAGNOSIS — I509 Heart failure, unspecified: Secondary | ICD-10-CM

## 2015-05-24 DIAGNOSIS — N179 Acute kidney failure, unspecified: Secondary | ICD-10-CM | POA: Insufficient documentation

## 2015-05-24 DIAGNOSIS — Z7982 Long term (current) use of aspirin: Secondary | ICD-10-CM

## 2015-05-24 DIAGNOSIS — I130 Hypertensive heart and chronic kidney disease with heart failure and stage 1 through stage 4 chronic kidney disease, or unspecified chronic kidney disease: Principal | ICD-10-CM | POA: Diagnosis present

## 2015-05-24 DIAGNOSIS — Z951 Presence of aortocoronary bypass graft: Secondary | ICD-10-CM | POA: Diagnosis not present

## 2015-05-24 DIAGNOSIS — I5023 Acute on chronic systolic (congestive) heart failure: Secondary | ICD-10-CM | POA: Diagnosis not present

## 2015-05-24 DIAGNOSIS — Z9119 Patient's noncompliance with other medical treatment and regimen: Secondary | ICD-10-CM

## 2015-05-24 DIAGNOSIS — I255 Ischemic cardiomyopathy: Secondary | ICD-10-CM | POA: Diagnosis present

## 2015-05-24 DIAGNOSIS — I517 Cardiomegaly: Secondary | ICD-10-CM | POA: Diagnosis not present

## 2015-05-24 DIAGNOSIS — Z79899 Other long term (current) drug therapy: Secondary | ICD-10-CM

## 2015-05-24 DIAGNOSIS — I252 Old myocardial infarction: Secondary | ICD-10-CM

## 2015-05-24 DIAGNOSIS — Z955 Presence of coronary angioplasty implant and graft: Secondary | ICD-10-CM

## 2015-05-24 DIAGNOSIS — Z8249 Family history of ischemic heart disease and other diseases of the circulatory system: Secondary | ICD-10-CM

## 2015-05-24 LAB — COMPREHENSIVE METABOLIC PANEL
ALBUMIN: 4 g/dL (ref 3.5–5.0)
ALT: 32 U/L (ref 17–63)
ANION GAP: 12 (ref 5–15)
AST: 32 U/L (ref 15–41)
Alkaline Phosphatase: 53 U/L (ref 38–126)
BUN: 18 mg/dL (ref 6–20)
CO2: 20 mmol/L — AB (ref 22–32)
Calcium: 9.4 mg/dL (ref 8.9–10.3)
Chloride: 112 mmol/L — ABNORMAL HIGH (ref 101–111)
Creatinine, Ser: 1.85 mg/dL — ABNORMAL HIGH (ref 0.61–1.24)
GFR calc Af Amer: 47 mL/min — ABNORMAL LOW (ref >60)
GFR calc non Af Amer: 41 mL/min — ABNORMAL LOW (ref >60)
GLUCOSE: 119 mg/dL — AB (ref 65–99)
POTASSIUM: 4.4 mmol/L (ref 3.5–5.1)
SODIUM: 144 mmol/L (ref 135–145)
Total Bilirubin: 1.2 mg/dL (ref 0.3–1.2)
Total Protein: 7.4 g/dL (ref 6.5–8.1)

## 2015-05-24 LAB — CBC
HCT: 41 % (ref 39.0–52.0)
Hemoglobin: 13.5 g/dL (ref 13.0–17.0)
MCH: 33.8 pg (ref 26.0–34.0)
MCHC: 32.9 g/dL (ref 30.0–36.0)
MCV: 102.5 fL — ABNORMAL HIGH (ref 78.0–100.0)
PLATELETS: 166 10*3/uL (ref 150–400)
RBC: 4 MIL/uL — ABNORMAL LOW (ref 4.22–5.81)
RDW: 12.9 % (ref 11.5–15.5)
WBC: 7.9 10*3/uL (ref 4.0–10.5)

## 2015-05-24 LAB — APTT: APTT: 30 s (ref 24–37)

## 2015-05-24 LAB — TROPONIN I
TROPONIN I: 0.04 ng/mL — AB (ref <0.031)
TROPONIN I: 0.04 ng/mL — AB (ref <0.031)
TROPONIN I: 0.05 ng/mL — AB (ref <0.031)

## 2015-05-24 LAB — BRAIN NATRIURETIC PEPTIDE: B Natriuretic Peptide: >4500 pg/mL — ABNORMAL HIGH (ref 0.0–100.0)

## 2015-05-24 MED ORDER — HYDRALAZINE HCL 20 MG/ML IJ SOLN
10.0000 mg | INTRAMUSCULAR | Status: DC | PRN
Start: 2015-05-24 — End: 2015-05-28

## 2015-05-24 MED ORDER — ASPIRIN EC 81 MG PO TBEC
81.0000 mg | DELAYED_RELEASE_TABLET | Freq: Every day | ORAL | Status: DC
  Administered 2015-05-24 – 2015-05-28 (×5): 81 mg via ORAL
  Filled 2015-05-24 (×6): qty 1

## 2015-05-24 MED ORDER — HEPARIN (PORCINE) IN NACL 100-0.45 UNIT/ML-% IJ SOLN
800.0000 [IU]/h | INTRAMUSCULAR | Status: DC
  Administered 2015-05-24: 800 [IU]/h via INTRAVENOUS

## 2015-05-24 MED ORDER — PANTOPRAZOLE SODIUM 40 MG PO TBEC
40.0000 mg | DELAYED_RELEASE_TABLET | Freq: Every day | ORAL | Status: DC
  Administered 2015-05-24 – 2015-05-28 (×4): 40 mg via ORAL
  Filled 2015-05-24 (×4): qty 1

## 2015-05-24 MED ORDER — ACETAMINOPHEN 325 MG PO TABS: 650.0000 mg | ORAL_TABLET | Freq: Four times a day (QID) | ORAL | Status: DC | PRN

## 2015-05-24 MED ORDER — CARVEDILOL 25 MG PO TABS
25.0000 mg | ORAL_TABLET | Freq: Two times a day (BID) | ORAL | Status: DC
  Administered 2015-05-24 – 2015-05-26 (×5): 25 mg via ORAL
  Filled 2015-05-24 (×5): qty 1

## 2015-05-24 MED ORDER — HYDRALAZINE HCL 25 MG PO TABS
50.0000 mg | ORAL_TABLET | Freq: Once | ORAL | Status: AC
  Administered 2015-05-24: 50 mg via ORAL
  Filled 2015-05-24: qty 2

## 2015-05-24 MED ORDER — NITROGLYCERIN 0.4 MG SL SUBL: 0.4000 mg | SUBLINGUAL_TABLET | SUBLINGUAL | Status: DC | PRN

## 2015-05-24 MED ORDER — FUROSEMIDE 10 MG/ML IJ SOLN
40.0000 mg | Freq: Once | INTRAMUSCULAR | Status: AC
  Administered 2015-05-24: 40 mg via INTRAVENOUS
  Filled 2015-05-24: qty 4

## 2015-05-24 MED ORDER — ACETAMINOPHEN 650 MG RE SUPP: 650.0000 mg | Freq: Four times a day (QID) | RECTAL | Status: DC | PRN

## 2015-05-24 MED ORDER — NITROGLYCERIN 2 % TD OINT
1.0000 [in_us] | TOPICAL_OINTMENT | Freq: Once | TRANSDERMAL | Status: AC
  Administered 2015-05-24: 1 [in_us] via TOPICAL
  Filled 2015-05-24: qty 1

## 2015-05-24 MED ORDER — HEPARIN BOLUS VIA INFUSION
4000.0000 [IU] | Freq: Once | INTRAVENOUS | Status: DC
  Administered 2015-05-24: 4000 [IU] via INTRAVENOUS
  Filled 2015-05-24: qty 4000

## 2015-05-24 MED ORDER — HYDRALAZINE HCL 20 MG/ML IJ SOLN
20.0000 mg | Freq: Once | INTRAMUSCULAR | Status: AC
  Administered 2015-05-24: 20 mg via INTRAVENOUS
  Filled 2015-05-24: qty 1

## 2015-05-24 MED ORDER — TICAGRELOR 90 MG PO TABS
90.0000 mg | ORAL_TABLET | Freq: Two times a day (BID) | ORAL | Status: DC
  Administered 2015-05-24 – 2015-05-28 (×8): 90 mg via ORAL
  Filled 2015-05-24 (×8): qty 1

## 2015-05-24 MED ORDER — SACUBITRIL-VALSARTAN 97-103 MG PO TABS
1.0000 | ORAL_TABLET | Freq: Two times a day (BID) | ORAL | Status: DC
  Administered 2015-05-24: 1 via ORAL
  Filled 2015-05-24 (×2): qty 1

## 2015-05-24 MED ORDER — FUROSEMIDE 10 MG/ML IJ SOLN
80.0000 mg | Freq: Two times a day (BID) | INTRAMUSCULAR | Status: DC
  Administered 2015-05-24 – 2015-05-25 (×2): 80 mg via INTRAVENOUS
  Filled 2015-05-24 (×2): qty 8

## 2015-05-24 MED ORDER — HEPARIN (PORCINE) IN NACL 100-0.45 UNIT/ML-% IJ SOLN
800.0000 [IU]/h | INTRAMUSCULAR | Status: DC
  Filled 2015-05-24: qty 250

## 2015-05-24 MED ORDER — ISOSORBIDE DINITRATE 10 MG PO TABS
20.0000 mg | ORAL_TABLET | Freq: Three times a day (TID) | ORAL | Status: DC
  Administered 2015-05-24 – 2015-05-28 (×13): 20 mg via ORAL
  Filled 2015-05-24 (×14): qty 2

## 2015-05-24 MED ORDER — POLYETHYLENE GLYCOL 3350 17 G PO PACK: 17.0000 g | PACK | Freq: Every day | ORAL | Status: DC | PRN

## 2015-05-24 MED ORDER — ROSUVASTATIN CALCIUM 40 MG PO TABS
40.0000 mg | ORAL_TABLET | Freq: Every day | ORAL | Status: DC
  Administered 2015-05-24 – 2015-05-27 (×4): 40 mg via ORAL
  Filled 2015-05-24 (×4): qty 1

## 2015-05-24 MED ORDER — ALLOPURINOL 100 MG PO TABS
100.0000 mg | ORAL_TABLET | Freq: Two times a day (BID) | ORAL | Status: DC
  Administered 2015-05-24 – 2015-05-28 (×8): 100 mg via ORAL
  Filled 2015-05-24 (×8): qty 1

## 2015-05-24 MED ORDER — AMLODIPINE BESYLATE 10 MG PO TABS
10.0000 mg | ORAL_TABLET | Freq: Every day | ORAL | Status: DC
  Administered 2015-05-24 – 2015-05-25 (×2): 10 mg via ORAL
  Filled 2015-05-24 (×2): qty 1

## 2015-05-24 MED ORDER — ASPIRIN 325 MG PO TABS: 325.0000 mg | ORAL_TABLET | Freq: Every day | ORAL | Status: DC

## 2015-05-24 MED ORDER — HEPARIN BOLUS VIA INFUSION
4000.0000 [IU] | Freq: Once | INTRAVENOUS | Status: AC
  Filled 2015-05-24: qty 4000

## 2015-05-24 MED ORDER — CALCITRIOL 0.25 MCG PO CAPS
0.2500 ug | ORAL_CAPSULE | ORAL | Status: DC
  Administered 2015-05-24 – 2015-05-28 (×3): 0.25 ug via ORAL
  Filled 2015-05-24 (×3): qty 1

## 2015-05-24 MED ORDER — HYDRALAZINE HCL 50 MG PO TABS
50.0000 mg | ORAL_TABLET | Freq: Three times a day (TID) | ORAL | Status: DC
  Administered 2015-05-25: 50 mg via ORAL
  Filled 2015-05-24 (×3): qty 1

## 2015-05-24 MED ORDER — EZETIMIBE 10 MG PO TABS
10.0000 mg | ORAL_TABLET | Freq: Every day | ORAL | Status: DC
  Administered 2015-05-24 – 2015-05-28 (×5): 10 mg via ORAL
  Filled 2015-05-24 (×5): qty 1

## 2015-05-24 MED ORDER — SPIRONOLACTONE 25 MG PO TABS
25.0000 mg | ORAL_TABLET | Freq: Every day | ORAL | Status: DC
  Administered 2015-05-24: 25 mg via ORAL
  Filled 2015-05-24: qty 1

## 2015-05-24 MED ORDER — HYDRALAZINE HCL 20 MG/ML IJ SOLN: 10.0000 mg | INTRAMUSCULAR | Status: DC

## 2015-05-24 NOTE — Consult Note (Signed)
CARDIOLOGY CONSULT NOTE   Patient ID: Marvin Martin MRN: [id number] DOB/AGE: 1965-10-02 50 y.o.  Admit date: 05/24/2015  Requesting Physician: Dr. Andria Frames Primary Physician:   No PCP Per Patient Primary Cardiologist:   Dr. Ardyth Man. Caryl Comes (EP) Reason for Consultation:   Chest pain/ CHF  HPI: Marvin Martin is a 50 y.o. male with a history of CAD s/p MIx5 and 18 stents with recurrent ISR and recent inf STEMI s/p DES to dRCA (03/2015), ICM/chronic systolic CHF (EF 50-03%) s/p St Jude ICD, CKD, HTN, HLD, and gout who presented to [facility] ED today with chest pain and SOB.  His cardiac hx dates back to ~ 2004, when he says he suffered his first of 4 MI's.  Over the years, he has had multiple procedures and reportedly has had 17 stents with recurrent ISR.  He also has LV dysfxn and underwent ICD placement in 2007 with subsequent gen change in 2012.  His last cath was in [place] in 2016, @ which time he says a stent was placed.    He moved to [place] from [place] in 2016 and established care in the CHF clinic and also with EP.  He had a CPX study in Dec 2016, which showed mild to mod HF limitation.  A myoview was performed in 01/2014 which was low risk.  He was admitted 3/15-3/17/17 with an inferior STEMI. Patient underwent emergent diagnostic catheterization revealing patent diagonal, ramus, and circumflex stents. There was a 95% proximal stenosis within a previously placed right coronary artery stent as well as a 50% mid stenosis. There was also a new 95% stenosis in the distal RCA. Previously placed stents in the posterolateral branch were occluded and these were felt to be chronic total occlusions. The proximal RCA was treated with cutting balloon angioplasty, as was the mid RCA. The distal RCA was successfully treated using a 2.5 x 16 mm Synergy drug-eluting stent. Patient was hypertensive throughout the procedure and required IV nitroglycerin. By the end of the procedure, he was  coughing and felt to be volume overloaded. He was treated with IV Lasix. His home dose of diuretics, Entresto and spironolactone were held due to AKI but eventually resumed. He was also placed on DAPT with ASA/Brilnta.   He was seen in the CHF clinic for post hospital follow up by Dr. Haroldine Laws on 04/12/15 and was doing okay from a volume standpoint. He was continued on lasix 63m daily. He was referred to the lipid clinic for PCSK 9-i therapy. When he met with our pharmacist on 04/15/15 he had run out of Brilinta x4 days and was provided samples. He also filled out paperwork for PCS K9 inhibitor therapy.   He was in his usual state of health until this morning at 4 AM when he was awoken from sleep with chest pain. It was a left-sided chest pressure that radiated to his left shoulder and associated shortness of breath, N/V and diaphoresis. The chest pressure was reminiscent of his previous cardiac chest pain. When the chest pain did not go away he took one SL NTG with some relief, but the chest pain returned. He also started coughing and knew "that there was fluid on his lungs." He denies lower extremity edema, abdominal distention, orthopnea or PND. He denies sodium or fluid indiscretion. He has been taking all of his medicines as prescribed. [facility].   He currently feels a little  better. He was given nitroglycerin ointment, IV Lasix 40 mg 1 and placed on CPAP ED in the ED. BNP >4500. Troponin 0.04>0.04. CXR: Stable cardiomegaly and mild pulmonary vascular congestion. ECG with sinus tachy, non specific IVCD and LVH with repolarization abnormality similar to previous.      Past Medical History  Diagnosis Date  . Chronic systolic CHF (congestive heart failure) ([place])     a. 01/2015 Echo: EF 20-25%, antlat, inflat AK.  [name] Hyperlipidemia   . Coronary artery disease     a. s/p MI x 4;  b. Reported h/o 17 stents with recurrent ISR;  c. 02/2014  Cath ([place]): PCI to unknown vessel;  d. 01/2015 MV: EF 18%, large scar, no ischemia; e. 03/2015 PCI: LM nl, LAD nl, D2 patent stent, RI patent stent, LCX patent stents p/m, RCA 95p ISR (PTCA), 11m 100d into RPL CTO, RPDA 90 (2.5x16 Synergy DES).  . Hypertensive heart disease   . Ischemic cardiomyopathy     a. s/p SJM ICD 2007 w/ gen change in 2012; b. 12/2014 CPX Test: mild to mod HF limitation w/ pVO2 20.3 and nl slope;  c. 01/2015 Echo: EF 20-25%.  . CKD (chronic kidney disease), stage III      Past Surgical History  Procedure Laterality Date  . 1Avonplacements Bilateral   . Pacemaker generator change Bilateral   . Insert / replace / remove pacemaker      St jude 2006  Generator Change 2012   . Cardiac catheterization      Most recent 02/2014   . Cardiac catheterization N/A 03/31/2015    Procedure: Left Heart Cath and Coronary Angiography;  Surgeon: TTroy Sine MD;  [facility];  Service: Cardiovascular;  Laterality: N/A;  . Cardiac catheterization N/A 03/31/2015    Procedure: Coronary Stent Intervention;  Surgeon: TTroy Sine MD;  [facility];  Service: Cardiovascular;  Laterality: N/A;    No Known Allergies  I have reviewed the patient's current medications     Prior to Admission medications   Medication Sig Start Date End Date Taking? Authorizing Provider  Alirocumab (PRALUENT) 75 MG/ML SOPN Inject 1 pen into the skin every 14 (fourteen) days. 04/15/15   DJolaine Artist MD  allopurinol (ZYLOPRIM) 100 MG tablet Take 100 mg by mouth 2 (two) times daily.     Historical Provider, MD  amLODipine (NORVASC) 10 MG tablet Take 10 mg by mouth daily.    Historical Provider, MD  aspirin 81 MG tablet Take 81 mg by mouth daily.    Historical Provider, MD  calcitRIOL (ROCALTROL) 0.25 MCG capsule Take 0.25 mcg by mouth every other day. Take on Monday, Wednesday and Friday    Historical Provider, MD  carvedilol (COREG) 25 MG tablet Take 25 mg by mouth  2 (two) times daily with a meal.    Historical Provider, MD  cholecalciferol (VITAMIN D) 1000 UNITS tablet Take 1,000 Units by mouth daily.    Historical Provider, MD  ezetimibe (ZETIA) 10 MG tablet Take 10 mg by mouth daily.    Historical Provider, MD  furosemide (LASIX) 40 MG tablet Take 40 mg by mouth daily.     Historical Provider, MD  hydrALAZINE (APRESOLINE) 50 MG tablet TAKE 1 TABLET 3 TIMES A DAY 04/26/15   DJolaine Artist MD  isosorbide dinitrate (ISORDIL) 20 MG tablet TAKE 1 TABLET 3 TIMES A DAY 04/26/15   DJolaine Artist MD  nitroGLYCERIN (NITROSTAT) 0.4 MG  SL tablet Place 0.4 mg under the tongue every 5 (five) minutes as needed for chest pain. Reported on 04/12/2015    Historical Provider, MD  omega-3 acid ethyl esters (LOVAZA) 1 G capsule Take 1 g by mouth daily.     Historical Provider, MD  ondansetron (ZOFRAN) 4 MG tablet Take 4 mg by mouth every 8 (eight) hours as needed for nausea or vomiting. Reported on 01/07/2015    Historical Provider, MD  PANTOPRAZOLE SODIUM PO Take 40 mg by mouth as directed.     Historical Provider, MD  rosuvastatin (CRESTOR) 40 MG tablet Take 40 mg by mouth daily.    Historical Provider, MD  sacubitril-valsartan (ENTRESTO) 97-103 MG Take 1 tablet by mouth 2 (two) times daily. 02/09/15   Jolaine Artist, MD  spironolactone (ALDACTONE) 25 MG tablet Take 25 mg by mouth daily.    Historical Provider, MD  ticagrelor (BRILINTA) 90 MG TABS tablet Take 1 tablet (90 mg total) by mouth 2 (two) times daily. 04/15/15   Jolaine Artist, MD     Social History   Social History  . Marital Status: Legally Separated    Spouse Name: N/A  . Number of Children: N/A  . Years of Education: N/A   Occupational History  . Not on file.   Social History Main Topics  . Smoking status: Never Smoker   . Smokeless tobacco: Not on file  . Alcohol Use: No  . Drug Use: No  . Sexual Activity: No   Other Topics Concern  . Not on file   Social History Narrative    Moved to [place] from [place] in 2016.  Lives alone in [place].    Family Status  Relation Status Death Age  . Mother Deceased   . Father Deceased    Family History  Problem Relation Age of Onset  . Cancer Mother     died when pt was 27.  She had a h/o heart dzs as well.  . Hypertension Mother   . Hyperlipidemia Father     Father died when pt was age 3 - he believes that he had a cardiac hx.  [name] CAD Sister      ROS:  Full 14 point review of systems complete and found to be negative unless listed above.  Physical Exam: Blood pressure 180/118, pulse 105, temperature 98.6 F (37 C), temperature source Oral, resp. rate 28, height 5' 7" (1.702 m), weight 148 lb (67.132 kg), SpO2 97 %.  General: Well developed, well nourished, male in no acute distress Head: Eyes PERRLA, No xanthomas.   Normocephalic and atraumatic, oropharynx without edema or exudate.  Lungs: decreased breath sounds at bases Heart: HRRR S1 S2, no rub/gallop, Heart regular rate and tachycardicwith S1, S2  murmur. pulses are 2+ extrem.   Neck: No carotid bruits. No lymphadenopathy.  Mildly elevated JVD. Abdomen: Bowel sounds present, abdomen soft and non-tender without masses or hernias noted. Msk:  No spine or cva tenderness. No weakness, no joint deformities or effusions. Extremities: No clubbing or cyanosis. No LE edema.  Neuro: Alert and oriented X 3. No focal deficits noted. Psych:  Good affect, responds appropriately Skin: No rashes or lesions noted.  Labs:   Lab Results  Component Value Date   WBC 7.9 05/24/2015   HGB 13.5 05/24/2015   HCT 41.0 05/24/2015   MCV 102.5* 05/24/2015   PLT 166 05/24/2015   No results for input(s): INR in the last 72 hours.  Recent Labs Lab 05/24/15  1158  NA 144  K 4.4  CL 112*  CO2 20*  BUN 18  CREATININE 1.85*  CALCIUM 9.4  PROT 7.4  BILITOT 1.2  ALKPHOS 53  ALT 32  AST 32  GLUCOSE 119*  ALBUMIN 4.0   No results found for: MG  Recent Labs  05/24/15 1158 05/24/15 1237   TROPONINI 0.04* 0.04*   No results for input(s): TROPIPOC in the last 72 hours. No results found for: PROBNP Lab Results  Component Value Date   CHOL 171 04/01/2015   HDL 35* 04/01/2015   LDLCALC 115* 04/01/2015   TRIG 105 04/01/2015   2D ECHO: 01/20/2015 LV EF: 20% - 25% Study Conclusions - Left ventricle: The cavity size was mildly dilated. There was  moderate concentric hypertrophy. Systolic function was severely  reduced. The estimated ejection fraction was in the range of 20%  to 25%. There is akinesis of the anterolateral and inferolateral  myocardium. The study is not technically sufficient to allow  evaluation of LV diastolic function. - Aortic valve: Trileaflet; mildly thickened, mildly calcified  leaflets. - Mitral valve: Calcified annulus. Mildly thickened leaflets . - Right ventricle: Pacer wire or catheter noted in right ventricle.  ECG:  HR 106, Sinus tachycardia, biatrial enlargement,  Nonspecific IVCD LVH with repol abnormality.    Radiology:  Dg Chest 2 View  05/24/2015  CLINICAL DATA:  Shortness of breath and chronic cough. Symptoms are worse today. Chest pain. EXAM: CHEST - 2 VIEW COMPARISON:  One-view chest x-ray 03/31/2015. FINDINGS: Mild cardiomegaly is stable. AICD and pacing wires are stable. Multiple coronary stents are noted. Mild pulmonary vascular congestion is similar to the prior exam. There are no effusions. No focal airspace consolidation is present. The visualized soft tissues and bony thorax are unremarkable. IMPRESSION: 1. Stable cardiomegaly and mild pulmonary vascular congestion. 2. No focal airspace disease. 3. Stable AICD and pacing wires. Electronically Signed   By: San Morelle M.D.   On: 05/24/2015 10:59    ASSESSMENT AND PLAN:    Active Problems:   CHF exacerbation ([place])  [place] Dirico is a 50 y.o. male with a history of CAD s/p MIx5 and 18 stents with recurrent ISR and recent inf STEMI s/p DES to dRCA (03/2015),  ICM/chronic systolic CHF (EF 96-22%) s/p St Jude ICD, CKD, HTN, HLD, and gout who presented to [facility] ED today with chest pain and SOB.  Acute on chronic systolic CHF: due to iCM. EF 20-25% on echo 1/17. RV ok. S/p ST Jude ICD  -- Patient has a cough and DOE. BNP >4500.CXR: Stable cardiomegaly and mild pulmonary vascular congestion. -- Continue Carvedilol 25 mg BID, Isordil 20 mg TID, Entresto 97-103 mg BID, Spironolactone 25 mg daily, and hydralazine 50 mg tid . Agree with Lasix IV 92m BID.   Hypertensive Crisis with acute HF - BP markedly elevated. Will treat with IV hydralazine. Resume home meds. Continue diuresis.  Chest pain:  -- Troponin 0.04>0.04. Per patient, this chest pain is reminiscent of previous angina prior to multiple previous stents. Will start IV heparin.   CAD: MI X5. Now with 18 stents  - s/p recent Inferior STEMI 3/17 due to ISR s/p DES to dRCA - Continue ASA, statin, ticagrelor (lifelong) and b-blocker. In the process of applying for PCSK9i therapy.   HTN: BPs have been uncontrolled as high as 180/118. He did not take any of his morning medications. He's been restarted on Coreg, Isordil, Entresto, spironolactone, hydralazine. Will add hydralazine 237mx1 and PRN  hydralazine 32m.    HLD: continue Crestor 40 and Zetia 10. LDL on admit was 115. Given recurrent events absolutely needs LDL < 70. He had been seen on lipid clinic for PCSK-9 injections.   CKD stage III: creat appear around his baseline at 1.8   Signed: TEileen Stanford PA-C 05/24/2015 3:55 PM  Pager [phone] Co-Sign MD  Patient seen and examined with KNell Range PA-C. We discussed all aspects of the encounter. I agree with the assessment and plan as stated above.  He has significant volume overload and HF symptoms in the setting of uncontrolled hypertension. Will treat with IV hydralazine and continue IV lasix 80 IV bid. CP may be related to HTN and HF but also at high risk for stent  occlusion and restenosis based on history and recent noncompliance with Brillinta. Will cycle markers. Start heparin. Possible R/L heart cath prior to d/c.   Bensimhon, Daniel,MD 6:46 PM

## 2015-05-24 NOTE — ED Notes (Signed)
Pt brought in EMS for CP and SOB.  CP began at 0400 this morning and SHOB began at 0700.  Pt reports he took 324mg  ASA at home and 1 of his nitro.  EMS gave an additional 3 nitro and 4mg  Zofran for nausea.  Pt was 85% on NRB and was placed on CPAP.  Pt reports relief from CP and SHOB at this time.  Pt is 98% on 5L [place]

## 2015-05-24 NOTE — Progress Notes (Signed)
Responded to Trauma A for pt coming in by EMS on cpap. Pt states SOB is much better at this time. Pt taken off cpap and placed on 5L [place]. RT will continue to monitor and wean O2 as tolerated.

## 2015-05-24 NOTE — Progress Notes (Signed)
ANTICOAGULATION CONSULT NOTE - Initial Consult  Pharmacy Consult for heparin Indication: chest pain/ACS  No Known Allergies  Patient Measurements: Height: 5\' 7"  (170.2 cm) Weight: 148 lb (67.132 kg) IBW/kg (Calculated) : 66.1 Heparin Dosing Weight: 67 kg  Vital Signs: Temp: 98.7 F (37.1 C) (05/08 1602) Temp Source: Oral (05/08 1602) BP: 154/109 mmHg (05/08 1602) Pulse Rate: 111 (05/08 1602)  Labs:  Recent Labs  05/24/15 1158 05/24/15 1237  HGB 13.5  --   HCT 41.0  --   PLT 166  --   APTT 30  --   CREATININE 1.85*  --   TROPONINI 0.04* 0.04*    Estimated Creatinine Clearance: 44.7 mL/min (by C-G formula based on Cr of 1.85).   Medical History: Past Medical History  Diagnosis Date  . Chronic systolic CHF (congestive heart failure) ([place])     a. 01/2015 Echo: EF 20-25%, antlat, inflat AK.  [name] Hyperlipidemia   . Coronary artery disease     a. s/p MI x 4;  b. Reported h/o 17 stents with recurrent ISR;  c. 02/2014 Cath ([place]): PCI to unknown vessel;  d. 01/2015 MV: EF 18%, large scar, no ischemia; e. 03/2015 PCI: LM nl, LAD nl, D2 patent stent, RI patent stent, LCX patent stents p/m, RCA 95p ISR (PTCA), 69m, 100d into RPL CTO, RPDA 90 (2.5x16 Synergy DES).  . Hypertensive heart disease   . Ischemic cardiomyopathy     a. s/p SJM ICD 2007 w/ gen change in 2012; b. 12/2014 CPX Test: mild to mod HF limitation w/ pVO2 20.3 and nl slope;  c. 01/2015 Echo: EF 20-25%.  . CKD (chronic kidney disease), stage III     Medications:  Prescriptions prior to admission  Medication Sig Dispense Refill Last Dose  . Alirocumab (PRALUENT) 75 MG/ML SOPN Inject 1 pen into the skin every 14 (fourteen) days. 2 pen 11 Past Week at Unknown time  . allopurinol (ZYLOPRIM) 100 MG tablet Take 100 mg by mouth 2 (two) times daily.    05/23/2015 at Unknown time  . amLODipine (NORVASC) 10 MG tablet Take 10 mg by mouth daily.   05/23/2015 at Unknown time  . aspirin 81 MG tablet Take 81 mg by mouth daily.    05/23/2015 at Unknown time  . calcitRIOL (ROCALTROL) 0.25 MCG capsule Take 0.25 mcg by mouth every other day. Take on Monday, Wednesday and Friday   Past Week at Unknown time  . carvedilol (COREG) 25 MG tablet Take 25 mg by mouth 2 (two) times daily with a meal.   05/23/2015 at 0830  . cholecalciferol (VITAMIN D) 1000 UNITS tablet Take 1,000 Units by mouth daily.   05/23/2015 at Unknown time  . ezetimibe (ZETIA) 10 MG tablet Take 10 mg by mouth daily.   05/23/2015 at Unknown time  . furosemide (LASIX) 40 MG tablet Take 40 mg by mouth daily.    05/23/2015 at Unknown time  . hydrALAZINE (APRESOLINE) 50 MG tablet TAKE 1 TABLET 3 TIMES A DAY 90 tablet 3 05/23/2015 at Unknown time  . isosorbide dinitrate (ISORDIL) 20 MG tablet TAKE 1 TABLET 3 TIMES A DAY 90 tablet 3 05/23/2015 at Unknown time  . nitroGLYCERIN (NITROSTAT) 0.4 MG SL tablet Place 0.4 mg under the tongue every 5 (five) minutes as needed for chest pain. Reported on 04/12/2015   05/24/2015 at Unknown time  . omega-3 acid ethyl esters (LOVAZA) 1 G capsule Take 1 g by mouth daily.    05/23/2015 at Unknown time  .  ondansetron (ZOFRAN) 4 MG tablet Take 4 mg by mouth every 8 (eight) hours as needed for nausea or vomiting. Reported on 05/24/2015   Past Week at Unknown time  . PANTOPRAZOLE SODIUM PO Take 40 mg by mouth as directed.    05/23/2015 at Unknown time  . rosuvastatin (CRESTOR) 40 MG tablet Take 40 mg by mouth daily.   05/23/2015 at Unknown time  . sacubitril-valsartan (ENTRESTO) 97-103 MG Take 1 tablet by mouth 2 (two) times daily. 60 tablet 6 05/23/2015 at Unknown time  . spironolactone (ALDACTONE) 25 MG tablet Take 25 mg by mouth daily.   05/23/2015 at Unknown time  . ticagrelor (BRILINTA) 90 MG TABS tablet Take 1 tablet (90 mg total) by mouth 2 (two) times daily. 60 tablet 5 05/23/2015 at 0830    Assessment: 50 y/o male with significant hx CAD who presented to the ED with SOB and chest pain. He is s/p recent inferior STEMI 03/2015 due to ISR s/p DES to dRCA. Pharmacy  consulted to begin IV heparin drip. No bleeding noted, Hb is normal, platelets are normal. Troponin 0.04 x2.   Goal of Therapy:  Heparin level 0.3-0.7 units/ml Monitor platelets by anticoagulation protocol: Yes   Plan:  - Heparin 4000 units IV bolus then 800 units/hr - 8 hr HL - Daily HL and CBC - Monitor for s/sx of bleeding   [facility], Pharm.D., BCPS Clinical Pharmacist Pager: [phone] 05/24/2015 5:24 PM

## 2015-05-24 NOTE — H&P (Signed)
[facility] Admission History and Physical Service Pager: [phone]  Patient name: Marvin Martin Medical record number: [id number] Date of birth: [birth year] Age: 50 y.o. Gender: male  Primary Care Provider: No PCP Per Patient Consultants: Cards Code Status: FULL (per discussion on admission)  Chief Complaint: shortness of breath/chest pain  Assessment and Plan: Marvin Martin is a 50 y.o. male presenting with shortness of breath and chest pain . PMH is significant for CAD (s/p MI 5 and stents 18 with recurrent in-stent thrombosis), ICM, HFrEF, HLD, and CKD III.   CHF Exacerbation: Associated with chest pain and dyspnea with O2 requirement that have now resolved. Was on CPAP in ED and then weaned to room air. Exam reveals crackles in bilateral lower lung fields but no other signs of fluid overload. CXR: Stable cardiomegaly and mild pulmonary vascular congestion. Echo 01/30/15 EF 20-25% RV normal. EKG unchanged from previous. BNP >4500. Troponin 0.04>0.04. He is seen by Dr. Haroldine Laws for heart failure. s/p 40 mg Lasix IV in ED, patient has been voiding adequately.  Home dose Lasix 40mg  qd - Admit to FPTS, attending Dr. Andria Frames - Vital signs per floor protocol - Continuous cardiac monitoring - Continuous pulse ox while he has O2 requirement - Cards consult - AM BMP - AM EKG - Trend troponins - Strict I/Os, daily weights - IV Lasix 80 mg BID - Continue home ASA 81 mg tomorrow since he had a total of 325 mg today - Continue home heart failure meds: Carvedilol 25 mg BID, Isordil 20 mg TID,  Sacubitril-valsartan (entresto) 97-103 mg BID, Spironolactone 25 mg daily  CAD/HTN:s/p MI 5 and stents 18 with recurrent in-stent thrombosis. Pt with a significant h/o CAD dating back to his mid-40's with multiple PCI's. s/p recent Inferior STEMI 3/17 due to ISR. BP markedly elevated in ED. Has not taken any of his medications today. - Continue ASA, statin, ticagrelor  (lifelong) and b-blocker - Continuous cardiac monitoring as above - Monitor BP  HLD: 04/01/15: LDL 115 and HDL 35. Goal LDL <70 -Continue home Crestor 40 and Zetia 10   CKD [id number] 1.85 on admission.Baseline Cr appears to be around 1.7-2.0.  - AM BMP - Avoid nephrotoxic agents   FEN/GI: Heart healthy diet, SLIV Prophylaxis: Home ticagrelor (lifelong)  Disposition: Admit to telemetry under FPTS  History of Present Illness:  Marvin Martin is a 50 y.o. male presenting with chest pain and shortness of breath.  Patient states that this morning around 0400 he woke up with sudden central "crushing" chest pain. Pain radiated to left neck and left shoulder. He took a nitro pill hoping the pain would go away. He notes that pain did go away but returned.  He began feeling nauseous and having dyspnea and non productive, cough he knew he needed to call EMS. He took 3 baby aspirins prior to EMS arriving. EMS gave him 3 Nitro, Zofran, and 1 more baby ASA. Patient states his chest pain resolved when he arrived to the ED but he still felt short of breath.  Patient does not use O2 at home.  Denies any lightheadedness, dizziness, fever, chills, dysuria.  He reports some increased LE edema.   Review Of Systems: Per HPI with the following additions: see HPI Otherwise the remainder of the systems were negative.  Patient Active Problem List   Diagnosis Date Noted  . CHF exacerbation ([place]) 05/24/2015  . CAD (coronary artery disease) 04/03/2015  . Hypertensive heart disease 04/03/2015  . Hyperlipidemia 04/03/2015  .  Acute ST elevation myocardial infarction (STEMI) ([place]) 03/31/2015  . ST elevation (STEMI) myocardial infarction involving right coronary artery ([place]) 03/31/2015  . Ischemic chest pain ([place])   . Cardiomyopathy, ischemic   . Chest pressure 02/09/2015  . CKD (chronic kidney disease) stage 3, GFR 30-59 ml/min 02/09/2015  . Chronic systolic heart failure ([place]) 01/07/2015  . CAD in native  artery 01/07/2015  . Benign essential HTN 01/07/2015  . CHF (congestive heart failure) ([place]) 12/11/2014    Past Medical History: Past Medical History  Diagnosis Date  . Chronic systolic CHF (congestive heart failure) ([place])     a. 01/2015 Echo: EF 20-25%, antlat, inflat AK.  [name] Hyperlipidemia   . Coronary artery disease     a. s/p MI x 4;  b. Reported h/o 17 stents with recurrent ISR;  c. 02/2014 Cath ([place]): PCI to unknown vessel;  d. 01/2015 MV: EF 18%, large scar, no ischemia; e. 03/2015 PCI: LM nl, LAD nl, D2 patent stent, RI patent stent, LCX patent stents p/m, RCA 95p ISR (PTCA), 34m, 100d into RPL CTO, RPDA 90 (2.5x16 Synergy DES).  . Hypertensive heart disease   . Ischemic cardiomyopathy     a. s/p SJM ICD 2007 w/ gen change in 2012; b. 12/2014 CPX Test: mild to mod HF limitation w/ pVO2 20.3 and nl slope;  c. 01/2015 Echo: EF 20-25%.  . CKD (chronic kidney disease), stage III     Past Surgical History: Past Surgical History  Procedure Laterality Date  . [place] placements Bilateral   . Pacemaker generator change Bilateral   . Insert / replace / remove pacemaker      St jude 2006  Generator Change 2012   . Cardiac catheterization      Most recent 02/2014   . Cardiac catheterization N/A 03/31/2015    Procedure: Left Heart Cath and Coronary Angiography;  Surgeon: Troy Sine, MD;  Location: [place] [facility];  Service: Cardiovascular;  Laterality: N/A;  . Cardiac catheterization N/A 03/31/2015    Procedure: Coronary Stent Intervention;  Surgeon: Troy Sine, MD;  Location: [place] [facility];  Service: Cardiovascular;  Laterality: N/A;    Social History: Social History  Substance Use Topics  . Smoking status: Never Smoker   . Smokeless tobacco: None  . Alcohol Use: No   Please also refer to relevant sections of EMR.  Family History: Family History  Problem Relation Age of Onset  . Cancer Mother     died when pt was 63.  She had a h/o heart dzs as well.  .  Hypertension Mother   . Hyperlipidemia Father     Father died when pt was age 35 - he believes that he had a cardiac hx.  [name] CAD Sister     Allergies and Medications: No Known Allergies No current facility-administered medications on file prior to encounter.   Current Outpatient Prescriptions on File Prior to Encounter  Medication Sig Dispense Refill  . Alirocumab (PRALUENT) 75 MG/ML SOPN Inject 1 pen into the skin every 14 (fourteen) days. 2 pen 11  . allopurinol (ZYLOPRIM) 100 MG tablet Take 100 mg by mouth 2 (two) times daily.     [name] amLODipine (NORVASC) 10 MG tablet Take 10 mg by mouth daily.    [name] aspirin 81 MG tablet Take 81 mg by mouth daily.    . calcitRIOL (ROCALTROL) 0.25 MCG capsule Take 0.25 mcg by mouth every other day. Take on Monday, Wednesday and Friday    .  carvedilol (COREG) 25 MG tablet Take 25 mg by mouth 2 (two) times daily with a meal.    . cholecalciferol (VITAMIN D) 1000 UNITS tablet Take 1,000 Units by mouth daily.    [name] ezetimibe (ZETIA) 10 MG tablet Take 10 mg by mouth daily.    . furosemide (LASIX) 40 MG tablet Take 40 mg by mouth daily.     . hydrALAZINE (APRESOLINE) 50 MG tablet TAKE 1 TABLET 3 TIMES A DAY 90 tablet 3  . isosorbide dinitrate (ISORDIL) 20 MG tablet TAKE 1 TABLET 3 TIMES A DAY 90 tablet 3  . nitroGLYCERIN (NITROSTAT) 0.4 MG SL tablet Place 0.4 mg under the tongue every 5 (five) minutes as needed for chest pain. Reported on 04/12/2015    . omega-3 acid ethyl esters (LOVAZA) 1 G capsule Take 1 g by mouth daily.     . ondansetron (ZOFRAN) 4 MG tablet Take 4 mg by mouth every 8 (eight) hours as needed for nausea or vomiting. Reported on 01/07/2015    . PANTOPRAZOLE SODIUM PO Take 40 mg by mouth as directed.     . rosuvastatin (CRESTOR) 40 MG tablet Take 40 mg by mouth daily.    . sacubitril-valsartan (ENTRESTO) 97-103 MG Take 1 tablet by mouth 2 (two) times daily. 60 tablet 6  . spironolactone (ALDACTONE) 25 MG tablet Take 25 mg by mouth daily.    .  ticagrelor (BRILINTA) 90 MG TABS tablet Take 1 tablet (90 mg total) by mouth 2 (two) times daily. 60 tablet 5    Objective: BP 170/117 mmHg  Pulse 98  Temp(Src) 98.6 F (37 C) (Oral)  Resp 25  Ht 5\' 7"  (1.702 m)  Wt 148 lb (67.132 kg)  BMI 23.17 kg/m2  SpO2 93% Exam: General: Pleasant well developed male, NAD Eyes: PERRLA, non icteric sclera ENTM: moist mucous membranes, some pharyngeal erythema seen Neck: supple, no lymphadenopathy Cardiovascular: RRR, muffled heart sounds. No edema. 2+ dorsalis pedis pulse bilaterally Respiratory: No respiratory distress but is tachypneic. Nasal canula in place. Clear lung sounds in bilateral upper lung fields, some coarse breath sounds with bilateral crackles heard at bilateral bases.  Abdomen: soft, non distended, non tender, normal bowel sounds MSK: full ROM of all joints Skin: some faint hypopigmented annular areas seen on bilateral upper arms Neuro: Alert and oriented x 3, CN 2-12 intact. B/L UE and LE 5/5 strength  Psych: Pleasant mood and normal affect  Labs and Imaging: CBC BMET   Recent Labs Lab 05/24/15 1158  WBC 7.9  HGB 13.5  HCT 41.0  PLT 166    Recent Labs Lab 05/24/15 1158  NA 144  K 4.4  CL 112*  CO2 20*  BUN 18  CREATININE 1.85*  GLUCOSE 119*  CALCIUM 9.4     Dg Chest 2 View  05/24/2015  CLINICAL DATA:  Shortness of breath and chronic cough. Symptoms are worse today. Chest pain. EXAM: CHEST - 2 VIEW COMPARISON:  One-view chest x-ray 03/31/2015. FINDINGS: Mild cardiomegaly is stable. AICD and pacing wires are stable. Multiple coronary stents are noted. Mild pulmonary vascular congestion is similar to the prior exam. There are no effusions. No focal airspace consolidation is present. The visualized soft tissues and bony thorax are unremarkable. IMPRESSION: 1. Stable cardiomegaly and mild pulmonary vascular congestion. 2. No focal airspace disease. 3. Stable AICD and pacing wires. Electronically Signed   By:  San Morelle M.D.   On: 05/24/2015 10:59     Carlyle Dolly, MD 05/24/2015, 1:40  PM PGY-1, [facility] pager: [phone], text pages welcome  I have separately seen and examined the patient. I have discussed the findings and exam with Dr Juanito Doom and agree with her excellent note.  My changes/additions are outlined in BLUE.   Aliegha Paullin M. Lajuana Ripple, DO PGY-2, [place]

## 2015-05-24 NOTE — ED Provider Notes (Signed)
CSN: [id number]     Arrival date & time 05/24/15  0944 History   First MD Initiated Contact with Patient 05/24/15 [phone]     Chief Complaint  Patient presents with  . Shortness of Breath  . Chest Pain    HPI Patient is a 50 year old male with past medical history of CAD (status post MI 4 and stents 17 with recurrent in-stent thrombosis, reduced ejection fraction heart failure (last echo 1/17 with EF 20-25%) disease presents with chest pain and shortness of breath. Patient reports he woke up this morning at 4 AM with pressure in the center of his chest that then radiated to his left shoulder. He had mild associated nausea but denies diaphoresis. At around 7am he developed shortness of breath and cough that is nonproductive. EMS was called. Prior to EMS arrival, patient took 3 baby aspirin and one spray of his own nitroglycerin which improved his chest pain. EMS gave an additional nitro x3, 4 mg of Zofran and an additional 81mg  aspirin to make a total of 324 mg. Patient was found to have O2 sats 85% on nonrebreather and was placed on CPAP prior to arrival. On arrival, reports his chest pain resolved and shortness of breath improved after these measures. On arrival patient is chest pain-free and w/ O2 sat 100% on CPAP. Denies recent fever, chills, productive cough or weight gain. Denies calf tenderness or swelling. Reports he felt well when he went to bed last night.  Past Medical History  Diagnosis Date  . Chronic systolic CHF (congestive heart failure) ([place])     a. 01/2015 Echo: EF 20-25%, antlat, inflat AK.  [name] Hyperlipidemia   . Coronary artery disease     a. s/p MI x 4;  b. Reported h/o 17 stents with recurrent ISR;  c. 02/2014 Cath ([place]): PCI to unknown vessel;  d. 01/2015 MV: EF 18%, large scar, no ischemia; e. 03/2015 PCI: LM nl, LAD nl, D2 patent stent, RI patent stent, LCX patent stents p/m, RCA 95p ISR (PTCA), 72m, 100d into RPL CTO, RPDA 90 (2.5x16 Synergy DES).  . Hypertensive heart disease   .  Ischemic cardiomyopathy     a. s/p SJM ICD 2007 w/ gen change in 2012; b. 12/2014 CPX Test: mild to mod HF limitation w/ pVO2 20.3 and nl slope;  c. 01/2015 Echo: EF 20-25%.  . CKD (chronic kidney disease), stage III    Past Surgical History  Procedure Laterality Date  . [place] placements Bilateral   . Pacemaker generator change Bilateral   . Insert / replace / remove pacemaker      St jude 2006  Generator Change 2012   . Cardiac catheterization      Most recent 02/2014   . Cardiac catheterization N/A 03/31/2015    Procedure: Left Heart Cath and Coronary Angiography;  Surgeon: Troy Sine, MD;  Location: [place] [facility];  Service: Cardiovascular;  Laterality: N/A;  . Cardiac catheterization N/A 03/31/2015    Procedure: Coronary Stent Intervention;  Surgeon: Troy Sine, MD;  Location: [place] [facility];  Service: Cardiovascular;  Laterality: N/A;   Family History  Problem Relation Age of Onset  . Cancer Mother     died when pt was 54.  She had a h/o heart dzs as well.  . Hypertension Mother   . Hyperlipidemia Father     Father died when pt was age 81 - he believes that he had a cardiac hx.  [name] CAD Sister  Social History  Substance Use Topics  . Smoking status: Never Smoker   . Smokeless tobacco: Not on file  . Alcohol Use: No    Review of Systems  Constitutional: Negative for fever and chills.  HENT: Negative for congestion and rhinorrhea.   Eyes: Negative for visual disturbance.  Respiratory: Positive for cough and shortness of breath. Negative for wheezing.   Cardiovascular: Positive for chest pain. Negative for palpitations and leg swelling.  Gastrointestinal: Positive for nausea. Negative for vomiting, abdominal pain, diarrhea and constipation.  Genitourinary: Negative for dysuria and frequency.  Musculoskeletal: Negative for back pain and neck pain.  Skin: Negative for pallor and rash.  Neurological: Negative for syncope, light-headedness and headaches.   Psychiatric/Behavioral: Negative for confusion.  All other systems reviewed and are negative.     Allergies  Review of patient's allergies indicates no known allergies.  Home Medications   Prior to Admission medications   Medication Sig Start Date End Date Taking? Authorizing Provider  Alirocumab (PRALUENT) 75 MG/ML SOPN Inject 1 pen into the skin every 14 (fourteen) days. 04/15/15   Jolaine Artist, MD  allopurinol (ZYLOPRIM) 100 MG tablet Take 100 mg by mouth 2 (two) times daily.     Historical Provider, MD  amLODipine (NORVASC) 10 MG tablet Take 10 mg by mouth daily.    Historical Provider, MD  aspirin 81 MG tablet Take 81 mg by mouth daily.    Historical Provider, MD  calcitRIOL (ROCALTROL) 0.25 MCG capsule Take 0.25 mcg by mouth every other day. Take on Monday, Wednesday and Friday    Historical Provider, MD  carvedilol (COREG) 25 MG tablet Take 25 mg by mouth 2 (two) times daily with a meal.    Historical Provider, MD  cholecalciferol (VITAMIN D) 1000 UNITS tablet Take 1,000 Units by mouth daily.    Historical Provider, MD  ezetimibe (ZETIA) 10 MG tablet Take 10 mg by mouth daily.    Historical Provider, MD  furosemide (LASIX) 40 MG tablet Take 40 mg by mouth daily.     Historical Provider, MD  hydrALAZINE (APRESOLINE) 50 MG tablet TAKE 1 TABLET 3 TIMES A DAY 04/26/15   Jolaine Artist, MD  isosorbide dinitrate (ISORDIL) 20 MG tablet TAKE 1 TABLET 3 TIMES A DAY 04/26/15   Jolaine Artist, MD  nitroGLYCERIN (NITROSTAT) 0.4 MG SL tablet Place 0.4 mg under the tongue every 5 (five) minutes as needed for chest pain. Reported on 04/12/2015    Historical Provider, MD  omega-3 acid ethyl esters (LOVAZA) 1 G capsule Take 1 g by mouth daily.     Historical Provider, MD  ondansetron (ZOFRAN) 4 MG tablet Take 4 mg by mouth every 8 (eight) hours as needed for nausea or vomiting. Reported on 01/07/2015    Historical Provider, MD  PANTOPRAZOLE SODIUM PO Take 40 mg by mouth as directed.      Historical Provider, MD  rosuvastatin (CRESTOR) 40 MG tablet Take 40 mg by mouth daily.    Historical Provider, MD  sacubitril-valsartan (ENTRESTO) 97-103 MG Take 1 tablet by mouth 2 (two) times daily. 02/09/15   Jolaine Artist, MD  spironolactone (ALDACTONE) 25 MG tablet Take 25 mg by mouth daily.    Historical Provider, MD  ticagrelor (BRILINTA) 90 MG TABS tablet Take 1 tablet (90 mg total) by mouth 2 (two) times daily. 04/15/15   Shaune Pascal Bensimhon, MD   BP 165/111 mmHg  Pulse 100  Temp(Src) 98.6 F (37 C) (Oral)  Resp 30  Ht 5\' 7"  (1.702 m)  Wt 67.132 kg  BMI 23.17 kg/m2  SpO2 98% Physical Exam  Constitutional: He is oriented to person, place, and time. He appears well-developed and well-nourished. He appears distressed (mild distress secondary to work of breathing).  Eyes: EOM are normal. Pupils are equal, round, and reactive to light.  Neck: Normal range of motion. Neck supple. No JVD present.  Pulmonary/Chest: Accessory muscle usage present. Tachypnea noted. He has decreased breath sounds. He has no wheezes. He has no rhonchi.  Talks in short sentences. Mild crackles at bilateral bases.   Abdominal: Soft. Bowel sounds are normal. He exhibits no distension. There is no tenderness.  Musculoskeletal: Normal range of motion. He exhibits no edema or tenderness.  Neurological: He is alert and oriented to person, place, and time.  Skin: Skin is warm and dry. He is not diaphoretic. No erythema.  Psychiatric: He has a normal mood and affect.    ED Course  Procedures (including critical care time) Labs Review Labs Reviewed  CBC - Abnormal; Notable for the following:    RBC 4.00 (*)    MCV 102.5 (*)    All other components within normal limits  COMPREHENSIVE METABOLIC PANEL - Abnormal; Notable for the following:    Chloride 112 (*)    CO2 20 (*)    Glucose, Bld 119 (*)    Creatinine, Ser 1.85 (*)    GFR calc non Af Amer 41 (*)    GFR calc Af Amer 47 (*)    All other  components within normal limits  BRAIN NATRIURETIC PEPTIDE - Abnormal; Notable for the following:    B Natriuretic Peptide >4500.0 (*)    All other components within normal limits  TROPONIN I - Abnormal; Notable for the following:    Troponin I 0.04 (*)    All other components within normal limits  TROPONIN I - Abnormal; Notable for the following:    Troponin I 0.04 (*)    All other components within normal limits  TROPONIN I - Abnormal; Notable for the following:    Troponin I 0.05 (*)    All other components within normal limits  APTT  BASIC METABOLIC PANEL  CBC  HEPARIN LEVEL (UNFRACTIONATED)    Imaging Review Dg Chest 2 View  05/24/2015  CLINICAL DATA:  Shortness of breath and chronic cough. Symptoms are worse today. Chest pain. EXAM: CHEST - 2 VIEW COMPARISON:  One-view chest x-ray 03/31/2015. FINDINGS: Mild cardiomegaly is stable. AICD and pacing wires are stable. Multiple coronary stents are noted. Mild pulmonary vascular congestion is similar to the prior exam. There are no effusions. No focal airspace consolidation is present. The visualized soft tissues and bony thorax are unremarkable. IMPRESSION: 1. Stable cardiomegaly and mild pulmonary vascular congestion. 2. No focal airspace disease. 3. Stable AICD and pacing wires. Electronically Signed   By: San Morelle M.D.   On: 05/24/2015 10:59   I have personally reviewed and evaluated these images and lab results as part of my medical decision-making.   EKG Interpretation   Date/Time:  Monday May 24 2015 09:48:57 EDT Ventricular Rate:  106 PR Interval:  182 QRS Duration: 125 QT Interval:  366 QTC Calculation: 486 R Axis:   73 Text Interpretation:  Sinus tachycardia Biatrial enlargement Nonspecific  intraventricular conduction delay Repol abnrm suggests ischemia,  anterolateral Borderline ST elevation, anterior leads Overall similar  previous Confirmed by ZAVITZ MD, JOSHUA [phone]) on 05/24/2015 9:55:29 AM      MDM  Final diagnoses:  Acute on chronic systolic congestive heart failure ([place])   50 year old male with known history of CAD s/p multiple stents and CHF presents with chest pain and shortness of breath since this morning. Received aspirin and nitroglycerin prior to arrival with resolution of chest pain. Also started on CPAP prior to arrival for shortness of breath and low O2 sats. On arrival patient's breathing work of breathing has improved and he was transitioned to 5 L nasal cannula with O2 sats 98%. Hypertensive [id number] and [id number] systolic 0000000 diastolic and nitro paste was applied on arrival. Exam with decreased breath sounds and mild crackles likely secondary to fluid overload. Bedside limited cardiac and pulmonary ultrasound was performed (please see separate documentation for full evaluation). Ultrasound is consistent with fluid overload and left-sided heart failure. EKG as above without signs of acute ST elevation MI. No significant changes compared with prior. Chest x-ray with mild cardiomegaly and pulmonary vascular congestion consistent with heart failure. Lab studies obtained consistent with CHF exacerbation with mildly elevated troponin of 0.04, BNP greater than 4000. No infectious signs or symptoms on exam. IV Lasix given. By mouth amlodipine given per patient's home blood pressure medicines for hypertension. Hospitalist was consulted for admission and will admit patient for observation for further treatment of acute on chronic heart failure exacerbation.  Patient seen and discussed with Dr. Reather Converse.     Gibson Ramp, MD 05/24/15 [id number]  Elnora Morrison, MD 05/25/15 [id number]

## 2015-05-25 DIAGNOSIS — N179 Acute kidney failure, unspecified: Secondary | ICD-10-CM | POA: Diagnosis not present

## 2015-05-25 DIAGNOSIS — Z955 Presence of coronary angioplasty implant and graft: Secondary | ICD-10-CM

## 2015-05-25 DIAGNOSIS — Z7902 Long term (current) use of antithrombotics/antiplatelets: Secondary | ICD-10-CM | POA: Diagnosis not present

## 2015-05-25 DIAGNOSIS — I255 Ischemic cardiomyopathy: Secondary | ICD-10-CM | POA: Diagnosis present

## 2015-05-25 DIAGNOSIS — I169 Hypertensive crisis, unspecified: Secondary | ICD-10-CM | POA: Diagnosis present

## 2015-05-25 DIAGNOSIS — Z951 Presence of aortocoronary bypass graft: Secondary | ICD-10-CM

## 2015-05-25 DIAGNOSIS — I130 Hypertensive heart and chronic kidney disease with heart failure and stage 1 through stage 4 chronic kidney disease, or unspecified chronic kidney disease: Secondary | ICD-10-CM | POA: Diagnosis not present

## 2015-05-25 DIAGNOSIS — Z7982 Long term (current) use of aspirin: Secondary | ICD-10-CM | POA: Diagnosis not present

## 2015-05-25 DIAGNOSIS — I2510 Atherosclerotic heart disease of native coronary artery without angina pectoris: Secondary | ICD-10-CM | POA: Diagnosis not present

## 2015-05-25 DIAGNOSIS — E785 Hyperlipidemia, unspecified: Secondary | ICD-10-CM | POA: Diagnosis present

## 2015-05-25 DIAGNOSIS — I5023 Acute on chronic systolic (congestive) heart failure: Secondary | ICD-10-CM | POA: Insufficient documentation

## 2015-05-25 DIAGNOSIS — N183 Chronic kidney disease, stage 3 (moderate): Secondary | ICD-10-CM | POA: Diagnosis not present

## 2015-05-25 DIAGNOSIS — I252 Old myocardial infarction: Secondary | ICD-10-CM | POA: Diagnosis not present

## 2015-05-25 DIAGNOSIS — Z9119 Patient's noncompliance with other medical treatment and regimen: Secondary | ICD-10-CM | POA: Diagnosis not present

## 2015-05-25 DIAGNOSIS — R0789 Other chest pain: Secondary | ICD-10-CM | POA: Diagnosis present

## 2015-05-25 DIAGNOSIS — Z79899 Other long term (current) drug therapy: Secondary | ICD-10-CM | POA: Diagnosis not present

## 2015-05-25 DIAGNOSIS — I209 Angina pectoris, unspecified: Secondary | ICD-10-CM

## 2015-05-25 DIAGNOSIS — Z8249 Family history of ischemic heart disease and other diseases of the circulatory system: Secondary | ICD-10-CM | POA: Diagnosis not present

## 2015-05-25 LAB — BASIC METABOLIC PANEL
Anion gap: 13 (ref 5–15)
BUN: 26 mg/dL — AB (ref 6–20)
CALCIUM: 9.3 mg/dL (ref 8.9–10.3)
CO2: 25 mmol/L (ref 22–32)
CREATININE: 2.55 mg/dL — AB (ref 0.61–1.24)
Chloride: 105 mmol/L (ref 101–111)
GFR, EST AFRICAN AMERICAN: 32 mL/min — AB (ref >60)
GFR, EST NON AFRICAN AMERICAN: 28 mL/min — AB (ref >60)
Glucose, Bld: 119 mg/dL — ABNORMAL HIGH (ref 65–99)
Potassium: 3.4 mmol/L — ABNORMAL LOW (ref 3.5–5.1)
SODIUM: 143 mmol/L (ref 135–145)

## 2015-05-25 LAB — TROPONIN I: Troponin I: 0.07 ng/mL — ABNORMAL HIGH (ref <0.031)

## 2015-05-25 LAB — HEPARIN LEVEL (UNFRACTIONATED)
HEPARIN UNFRACTIONATED: 0.83 [IU]/mL — AB (ref 0.30–0.70)
Heparin Unfractionated: 0.64 IU/mL (ref 0.30–0.70)
Heparin Unfractionated: 0.51 IU/mL (ref 0.30–0.70)

## 2015-05-25 LAB — CBC
HCT: 41.5 % (ref 39.0–52.0)
Hemoglobin: 13.8 g/dL (ref 13.0–17.0)
MCH: 33.5 pg (ref 26.0–34.0)
MCHC: 33.3 g/dL (ref 30.0–36.0)
MCV: 100.7 fL — ABNORMAL HIGH (ref 78.0–100.0)
PLATELETS: 170 10*3/uL (ref 150–400)
RBC: 4.12 MIL/uL — ABNORMAL LOW (ref 4.22–5.81)
RDW: 13.3 % (ref 11.5–15.5)
WBC: 7.3 10*3/uL (ref 4.0–10.5)

## 2015-05-25 LAB — MAGNESIUM: MAGNESIUM: 1.7 mg/dL (ref 1.7–2.4)

## 2015-05-25 MED ORDER — HYDRALAZINE HCL 25 MG PO TABS
25.0000 mg | ORAL_TABLET | Freq: Three times a day (TID) | ORAL | Status: DC
  Administered 2015-05-25 – 2015-05-28 (×9): 25 mg via ORAL
  Filled 2015-05-25 (×9): qty 1

## 2015-05-25 MED ORDER — HEPARIN (PORCINE) IN NACL 100-0.45 UNIT/ML-% IJ SOLN
700.0000 [IU]/h | INTRAMUSCULAR | Status: DC
  Administered 2015-05-25 (×2): 700 [IU]/h via INTRAVENOUS
  Filled 2015-05-25 (×2): qty 250

## 2015-05-25 MED ORDER — POTASSIUM CHLORIDE CRYS ER 20 MEQ PO TBCR
40.0000 meq | EXTENDED_RELEASE_TABLET | Freq: Once | ORAL | Status: AC
  Administered 2015-05-25: 40 meq via ORAL
  Filled 2015-05-25: qty 2

## 2015-05-25 NOTE — Progress Notes (Signed)
Family Medicine Teaching Service Daily Progress Note Intern Pager: [phone]  Patient name: Marvin Martin Medical record number: [id number] Date of birth: 21-Sep-1965 Age: 50 y.o. Gender: male  Primary Care Provider: No PCP Per Patient Consultants: cardiology/heart failure team Code Status: Full (per discussion on admission)  Pt Overview and Major Events to Date:  5/8: Admit to FPTS  Assessment and Plan: Marvin Martin is a 50 y.o. male presenting with shortness of breath and chest pain . PMH is significant for CAD (s/p MI 5 and stents 18 with recurrent in-stent thrombosis), ICM, HFrEF, HLD, and CKD III.   CHF Exacerbation: Associated with chest pain and dyspnea with O2 requirement that have now resolved. Was on CPAP in ED and then weaned to room air. Exam reveals crackles in bilateral lower lung fields but no other signs of fluid overload. He is seen by Dr. Haroldine Laws for heart failure. Home dose Lasix 40mg  qd. CXR: Stable cardiomegaly and mild pulmonary vascular congestion. Echo 01/30/15 EF 20-25% RV normal. EKG unchanged from previous. BNP >4500. Troponin 0.04>0.04>0.05. s/p 40 mg Lasix IV in ED and 80 mg IV yesterday, patient has been voiding adequately.UO: 350cc overnight (has had some unrecorded voids). 148 lbs yesterday, 135 lbs today. Cr has risen from 1.85 to 2.55 overnight.  - Vital signs per floor protocol - Continuous cardiac monitoring - Continuous O2 monitoring, on 3L [place] now - Cardiology/heart failure team consulted, appreciate their assistance  - IV Heparin drip started yesterday due to high risk for stent occlusion (apparently patient has not been compliant with Brilinta)  - L/RHC tomorrow - AM BMPs - AM EKGs - Trend troponins - Strict I/Os, daily weights - Stop IV Lasix 80 mg BID and  Sacubitril-valsartan (entresto) 97-103 mg BID due to increase in Cr - Continue home ASA 81 mg  - Continue home heart failure meds: Carvedilol 25 mg BID, Isordil 20 mg TID, Spironolactone  25 mg daily  CAD/HTN:s/p MI 5 and stents 18 with recurrent in-stent thrombosis. Pt with a significant h/o CAD dating back to his mid-40's with multiple PCI's. s/p recent Inferior STEMI 3/17 due to ISR. BP markedly elevated in ED, Did not take any of his medications yesterday. Today BP WNL.  - Continue ASA, statin, ticagrelor (lifelong) and b-blocker - Continuous cardiac monitoring as above - Monitor BP - Cardiology/heart failure team recs as above  HLD: 04/01/15: LDL 115 and HDL 35. Goal LDL <70 -Continue home Crestor 40 and Zetia 10   CKD [id number] 1.85 on admission. Cr now 2.55.Baseline Cr appears to be around 1.7-2.0.  - AM BMP - Avoid nephrotoxic agents  - Stop IV Lasix 80 mg BID and  Sacubitril-valsartan (entresto) 97-103 mg BID due to increase in Cr   FEN/GI: Heart healthy diet, SLIV Prophylaxis: Home ticagrelor (lifelong), IV Heparin per cardio  Disposition:Continue to monitor inpatient until cleared from cardiology  Subjective:  Patient has no complaints this morning and is in good spirits. He states that his dyspnea has improved from yesterday. Additionally he has been urinating all night long, some voids were unrecorded. Denies chest pain, headaches, fever, dizziness, nausea.   Objective: Temp:  [98.3 F (36.8 C)-99.7 F (37.6 C)] 98.3 F (36.8 C) (05/09 0621) Pulse Rate:  [94-111] 94 (05/09 0621) Resp:  [18-40] 18 (05/09 0621) BP: (95-180)/(67-119) 129/91 mmHg (05/09 0621) SpO2:  [91 %-99 %] 99 % (05/09 0621) Weight:  [135 lb 9.6 oz (61.508 kg)-148 lb (67.132 kg)] 135 lb 9.6 oz (61.508 kg) (05/09 [id number]) Physical Exam: General:  Pleasant well developed male, NAD Cardiovascular: RRR, no murmur. No edema. 2+ dorsalis pedis pulse bilaterally Respiratory: No respiratory distress, CTAB, no crackles appreciated. On 3 L  Abdomen: soft, non distended, non tender, normal bowel sounds Extremities: no edema  Laboratory:  Recent Labs Lab 05/24/15 1158 05/25/15 0308   WBC 7.9 7.3  HGB 13.5 13.8  HCT 41.0 41.5  PLT 166 170    Recent Labs Lab 05/24/15 1158 05/25/15 0308  NA 144 143  K 4.4 3.4*  CL 112* 105  CO2 20* 25  BUN 18 26*  CREATININE 1.85* 2.55*  CALCIUM 9.4 9.3  PROT 7.4  --   BILITOT 1.2  --   ALKPHOS 53  --   ALT 32  --   AST 32  --   GLUCOSE 119* 119*    Troponin: 0.04>0.04>0.05  Imaging/Diagnostic Tests: Dg Chest 2 View  05/24/2015  CLINICAL DATA:  Shortness of breath and chronic cough. Symptoms are worse today. Chest pain. EXAM: CHEST - 2 VIEW COMPARISON:  One-view chest x-ray 03/31/2015. FINDINGS: Mild cardiomegaly is stable. AICD and pacing wires are stable. Multiple coronary stents are noted. Mild pulmonary vascular congestion is similar to the prior exam. There are no effusions. No focal airspace consolidation is present. The visualized soft tissues and bony thorax are unremarkable. IMPRESSION: 1. Stable cardiomegaly and mild pulmonary vascular congestion. 2. No focal airspace disease. 3. Stable AICD and pacing wires. Electronically Signed   By: San Morelle M.D.   On: 05/24/2015 10:59     Carlyle Dolly, MD 05/25/2015, 6:56 AM PGY-1, [place] Intern pager: [phone], text pages welcome

## 2015-05-25 NOTE — Progress Notes (Signed)
ANTICOAGULATION CONSULT NOTE - Follow Up Consult  Pharmacy Consult for heparin Indication: chest pain/ACS   Patient Measurements: Height: 5\' 7"  (170.2 cm) Weight: 148 lb (67.132 kg) IBW/kg (Calculated) : 66.1 Heparin Dosing Weight: 67 kg   Labs:  Recent Labs  05/24/15 1158 05/24/15 1237 05/24/15 1627 05/25/15 0308 05/25/15 1054 05/25/15 1925  HGB 13.5  --   --  13.8  --   --   HCT 41.0  --   --  41.5  --   --   PLT 166  --   --  170  --   --   APTT 30  --   --   --   --   --   HEPARINUNFRC  --   --   --  0.64 0.83* 0.51  CREATININE 1.85*  --   --  2.55*  --   --   TROPONINI 0.04* 0.04* 0.05*  --  0.07*  --      Assessment: 50yo male on heparin with initial dosing for CP. Heparin level this AM is high at 0.83 on IV heparin drip rate 800 units/hr. Hgb stable at 13.8 and pltc wnl at 170K.  Noted that Urine darkening some and patient was up most of the night urinating- Diuresing.  RN reports no blood in urine and no other bleeding note. We will decrease heparin rate and recheck HL. Plan is for [facility] tomorrow  Repeat HL 0.51 on heparin 700 units/hr. No issues with infusion or bleeding noted.  Plan:  Continue heparin 700 units/hr Check HL in 6 hours Daily HL, CBC  Andrey Cota. Diona Foley, PharmD, BCPS Clinical Pharmacist Pager [phone]  05/25/2015,8:22 PM

## 2015-05-25 NOTE — Progress Notes (Signed)
CARDIAC REHAB PHASE I   PRE:  Rate/Rhythm: 90 SR  BP:  Sitting: 103/68        SaO2: 99 RA  MODE:  Ambulation: 860 ft   POST:  Rate/Rhythm: 87 SR  BP:  Sitting: 93/73         SaO2: 99 RA  Pt ambulated 860 ft on RA, IV, independent, moderate, steady gait, tolerated well with no complaints. Pt very appreciative of walk. Pt to recliner after walk, call bell within reach. Will follow.   [id number] Lenna Sciara, RN, BSN 05/25/2015 2:47 PM

## 2015-05-25 NOTE — Progress Notes (Signed)
ANTICOAGULATION CONSULT NOTE - Follow Up Consult  Pharmacy Consult for heparin Indication: chest pain/ACS  Labs:  Recent Labs  05/24/15 1158 05/24/15 1237 05/24/15 1627 05/25/15 0308  HGB 13.5  --   --  13.8  HCT 41.0  --   --  41.5  PLT 166  --   --  170  APTT 30  --   --   --   HEPARINUNFRC  --   --   --  0.64  CREATININE 1.85*  --   --   --   TROPONINI 0.04* 0.04* 0.05*  --      Assessment/Plan:  50yo male therapeutic on heparin with initial dosing for CP. Will continue gtt at current rate and confirm stable with additional level.   Wynona Neat, PharmD, BCPS  05/25/2015,3:39 AM

## 2015-05-25 NOTE — Progress Notes (Signed)
Advanced Heart Failure Rounding Note  PCP: None Primary Cardiologist:  Dr. Haroldine Laws / Dr. Caryl Comes.  Subjective:    Feels good this am. Denies SOB. Fluid down a lot.   Urine darkening some.  Was up most of the night peeing.   I/O inaccurate.  Weight down 13 lbs.   Creatinine 1.85 -> 2.55  Objective:   Weight Range: 135 lb 9.6 oz (61.508 kg) Body mass index is 21.23 kg/(m^2).   Vital Signs:   Temp:  [98.1 F (36.7 C)-99.7 F (37.6 C)] 98.1 F (36.7 C) (05/09 0752) Pulse Rate:  [85-111] 85 (05/09 0752) Resp:  [18-28] 18 (05/09 0752) BP: (95-180)/(67-118) 112/81 mmHg (05/09 0752) SpO2:  [91 %-100 %] 100 % (05/09 0752) Weight:  [135 lb 9.6 oz (61.508 kg)] 135 lb 9.6 oz (61.508 kg) (05/09 0621) Last BM Date: 05/23/15  Weight change: Filed Weights   05/24/15 0954 05/25/15 0621  Weight: 148 lb (67.132 kg) 135 lb 9.6 oz (61.508 kg)    Intake/Output:   Intake/Output Summary (Last 24 hours) at 05/25/15 1118 Last data filed at 05/25/15 0700  Gross per 24 hour  Intake 621.87 ml  Output    350 ml  Net 271.87 ml     Physical Exam: General:  Well appearing. No resp difficulty HEENT: normal Neck: supple. JVP not elevated . Carotids 2+ bilat; no bruits. No thyromegaly or nodule noted Cor: PMI nondisplaced. Regular rate & rhythm. No rubs, gallops or murmurs. Lungs: CTAB, normal effort Abdomen: soft, nontender, nondistended. No hepatosplenomegaly. No bruits or masses. Good bowel sounds. Extremities: no cyanosis, clubbing, rash, edema Neuro: alert & orientedx3, cranial nerves grossly intact. moves all 4 extremities w/o difficulty. Affect pleasant  Telemetry: NSR  Labs: CBC  Recent Labs  05/24/15 1158 05/25/15 0308  WBC 7.9 7.3  HGB 13.5 13.8  HCT 41.0 41.5  MCV 102.5* 100.7*  PLT 166 [id number]   Basic Metabolic Panel  Recent Labs  05/24/15 1158 05/25/15 0308  NA 144 143  K 4.4 3.4*  CL 112* 105  CO2 20* 25  GLUCOSE 119* 119*  BUN 18 26*  CREATININE 1.85*  2.55*  CALCIUM 9.4 9.3   Liver Function Tests  Recent Labs  05/24/15 1158  AST 32  ALT 32  ALKPHOS 53  BILITOT 1.2  PROT 7.4  ALBUMIN 4.0   No results for input(s): LIPASE, AMYLASE in the last 72 hours. Cardiac Enzymes  Recent Labs  05/24/15 1158 05/24/15 1237 05/24/15 1627  TROPONINI 0.04* 0.04* 0.05*    BNP: BNP (last 3 results)  Recent Labs  05/24/15 1159  BNP >4500.0*    ProBNP (last 3 results) No results for input(s): PROBNP in the last 8760 hours.   D-Dimer No results for input(s): DDIMER in the last 72 hours. Hemoglobin A1C No results for input(s): HGBA1C in the last 72 hours. Fasting Lipid Panel No results for input(s): CHOL, HDL, LDLCALC, TRIG, CHOLHDL, LDLDIRECT in the last 72 hours. Thyroid Function Tests No results for input(s): TSH, T4TOTAL, T3FREE, THYROIDAB in the last 72 hours.  Invalid input(s): FREET3  Other results:     Imaging/Studies:  Dg Chest 2 View  05/24/2015  CLINICAL DATA:  Shortness of breath and chronic cough. Symptoms are worse today. Chest pain. EXAM: CHEST - 2 VIEW COMPARISON:  One-view chest x-ray 03/31/2015. FINDINGS: Mild cardiomegaly is stable. AICD and pacing wires are stable. Multiple coronary stents are noted. Mild pulmonary vascular congestion is similar to the prior exam. There are no  effusions. No focal airspace consolidation is present. The visualized soft tissues and bony thorax are unremarkable. IMPRESSION: 1. Stable cardiomegaly and mild pulmonary vascular congestion. 2. No focal airspace disease. 3. Stable AICD and pacing wires. Electronically Signed   By: San Morelle M.D.   On: 05/24/2015 10:59     Latest Echo  Latest Cath   Medications:     Scheduled Medications: . allopurinol  100 mg Oral BID  . amLODipine  10 mg Oral Daily  . aspirin EC  81 mg Oral Daily  . calcitRIOL  0.25 mcg Oral Q M,W,F  . carvedilol  25 mg Oral BID WC  . ezetimibe  10 mg Oral Daily  . hydrALAZINE  50 mg Oral  TID  . isosorbide dinitrate  20 mg Oral TID  . pantoprazole  40 mg Oral Daily  . rosuvastatin  40 mg Oral q1800  . ticagrelor  90 mg Oral BID     Infusions: . heparin 800 Units/hr (05/24/15 1815)     PRN Medications:  acetaminophen **OR** acetaminophen, hydrALAZINE, nitroGLYCERIN, polyethylene glycol   Assessment   1. Acute on chronic systolic CHF 2. Hypertensive crisis with acute HF 3. Chest pain 4. CAD - MI x 5, Stents x 18 5. HLD 6. AKI on CKD  Plan    He diuresed very well.  Now likely dry with AKI.   Hold lasix and nephrotoxic drugs today. (No Entresto or Arlyce Harman)  With chest pain similar to his previous and his decompensation, will plan for [facility] tomorrow.    Will use minimal dye and aim for only 1 picture of RCA for angiography.   Consult Cardiac Rehab.  Length of Stay: 1   Shirley Friar PA-C 05/25/2015, 11:18 AM  Advanced Heart Failure Team Pager [phone] (M-F; 7a - 4p)  Please contact [facility] for night-coverage after hours (4p -7a ) and weekends on [email or web address]   Patient seen and examined with Oda Kilts, PA-C. We discussed all aspects of the encounter. I agree with the assessment and plan as stated above.   Volume status much improved. Creatinine slightly worse. BP soft. Will hold diuretics and Entresto. Troponins flat. Will plan RHC and 1 image of RCA to make sure stent is patent.   Bensimhon, Daniel,MD 6:38 PM

## 2015-05-25 NOTE — Progress Notes (Signed)
ANTICOAGULATION CONSULT NOTE - Follow Up Consult  Pharmacy Consult for heparin Indication: chest pain/ACS   Patient Measurements: Height: 5\' 7"  (170.2 cm) Weight: 148 lb (67.132 kg) IBW/kg (Calculated) : 66.1 Heparin Dosing Weight: 67 kg   Labs:  Recent Labs  05/24/15 1158 05/24/15 1237 05/24/15 1627 05/25/15 0308 05/25/15 1054  HGB 13.5  --   --  13.8  --   HCT 41.0  --   --  41.5  --   PLT 166  --   --  170  --   APTT 30  --   --   --   --   HEPARINUNFRC  --   --   --  0.64 0.83*  CREATININE 1.85*  --   --  2.55*  --   TROPONINI 0.04* 0.04* 0.05*  --   --      Assessment: 50yo male on heparin with initial dosing for CP. Heparin level this AM is high at 0.83 on IV heparin drip rate 800 units/hr. Hgb stable at 13.8 and pltc wnl at 170K.  Noted that Urine darkening some and patient was up most of the night urinating- Diuresing.  RN reports no blood in urine and no other bleeding note. We will decrease heparin rate and recheck HL. Plan is for [facility] tomorrow  Plan:  Decrease IV heparin rate to 700 units/hr Check HL in 6 hours Daily HL, CBC  Thank you for allowing pharmacy to be part of this patients care team. Nicole Cella, RPh Clinical Pharmacist Pager: [phone] 05/25/2015,11:40 AM

## 2015-05-26 LAB — CBC
HEMATOCRIT: 45.1 % (ref 39.0–52.0)
HEMOGLOBIN: 14.8 g/dL (ref 13.0–17.0)
MCH: 32.9 pg (ref 26.0–34.0)
MCHC: 32.8 g/dL (ref 30.0–36.0)
MCV: 100.2 fL — AB (ref 78.0–100.0)
Platelets: 180 10*3/uL (ref 150–400)
RBC: 4.5 MIL/uL (ref 4.22–5.81)
RDW: 12.9 % (ref 11.5–15.5)
WBC: 5.9 10*3/uL (ref 4.0–10.5)

## 2015-05-26 LAB — BASIC METABOLIC PANEL
ANION GAP: 14 (ref 5–15)
BUN: 43 mg/dL — ABNORMAL HIGH (ref 6–20)
CHLORIDE: 103 mmol/L (ref 101–111)
CO2: 25 mmol/L (ref 22–32)
CREATININE: 2.71 mg/dL — AB (ref 0.61–1.24)
Calcium: 9.5 mg/dL (ref 8.9–10.3)
GFR calc non Af Amer: 26 mL/min — ABNORMAL LOW (ref >60)
GFR, EST AFRICAN AMERICAN: 30 mL/min — AB (ref >60)
Glucose, Bld: 119 mg/dL — ABNORMAL HIGH (ref 65–99)
POTASSIUM: 3.7 mmol/L (ref 3.5–5.1)
Sodium: 142 mmol/L (ref 135–145)

## 2015-05-26 LAB — PROTIME-INR
INR: 1.1 (ref 0.00–1.49)
Prothrombin Time: 14.4 seconds (ref 11.6–15.2)

## 2015-05-26 LAB — HEPARIN LEVEL (UNFRACTIONATED): HEPARIN UNFRACTIONATED: 0.35 [IU]/mL (ref 0.30–0.70)

## 2015-05-26 MED ORDER — SODIUM CHLORIDE 0.9 % IV SOLN: 250.0000 mL | INTRAVENOUS | Status: DC | PRN

## 2015-05-26 MED ORDER — SODIUM CHLORIDE 0.9% FLUSH: 3.0000 mL | Freq: Two times a day (BID) | INTRAVENOUS | Status: DC

## 2015-05-26 MED ORDER — ASPIRIN 81 MG PO CHEW
81.0000 mg | CHEWABLE_TABLET | ORAL | Status: AC
  Administered 2015-05-27: 81 mg via ORAL
  Filled 2015-05-26: qty 1

## 2015-05-26 MED ORDER — AMLODIPINE BESYLATE 5 MG PO TABS
5.0000 mg | ORAL_TABLET | Freq: Every day | ORAL | Status: DC
  Administered 2015-05-26 – 2015-05-28 (×3): 5 mg via ORAL
  Filled 2015-05-26 (×3): qty 1

## 2015-05-26 MED ORDER — SODIUM CHLORIDE 0.9% FLUSH: 3.0000 mL | INTRAVENOUS | Status: DC | PRN

## 2015-05-26 MED ORDER — SODIUM CHLORIDE 0.9 % IV SOLN: INTRAVENOUS | Status: DC

## 2015-05-26 MED ORDER — SODIUM CHLORIDE 0.9 % IV SOLN
INTRAVENOUS | Status: DC
  Administered 2015-05-27: 08:00:00 via INTRAVENOUS

## 2015-05-26 MED ORDER — SODIUM CHLORIDE 0.9 % IV SOLN
INTRAVENOUS | Status: DC
  Administered 2015-05-26: 250 mL via INTRAVENOUS
  Administered 2015-05-26: 1000 mL via INTRAVENOUS

## 2015-05-26 MED ORDER — SODIUM CHLORIDE 0.9% FLUSH
3.0000 mL | Freq: Two times a day (BID) | INTRAVENOUS | Status: DC
  Administered 2015-05-26: 3 mL via INTRAVENOUS

## 2015-05-26 NOTE — Progress Notes (Signed)
Family Medicine Teaching Service Daily Progress Note Intern Pager: [phone]  Patient name: Marvin Martin Medical record number: [id number] Date of birth: 02/11/1965 Age: 50 y.o. Gender: male  Primary Care Provider: No PCP Per Patient Consultants: cardiology/heart failure team Code Status: Full (per discussion on admission)  Pt Overview and Major Events to Date:  5/8: Admit to [place] 5/9: Diuresed, Lasix and Entresto stopped due to rising Cr  Assessment and Plan: Carmine Polito is a 50 y.o. male presenting with shortness of breath and chest pain . PMH is significant for CAD (s/p MI 5 and stents 18 with recurrent in-stent thrombosis), ICM, HFrEF, HLD, and CKD III.   CHF Exacerbation: Resolved crackles in bilateral lower lung fields, no other signs of fluid overload. He is seen by Dr. Haroldine Laws for heart failure.UO: 475cc overnight (has had some unrecorded voids). 148 lbs on admission, 135 lbs yesterday, and 137 lbs today. Cr has risen from 1.85>2.55>2.71. Lasix 80 mg BID and Sacubitril-valsartan (entresto) 97-103 mg BID stopped on 5/9 due to increase in Cr. Still has O2 requirement of 3.5 L.  - Vital signs per floor protocol - Continuous cardiac monitoring - Continuous O2 monitoring, on 3.5L [place] now (was on 3L yesterday) - Cardiology/heart failure team consulted, appreciate their assistance  - IV Heparin drip started on 5/8 due to high risk for stent occlusion (apparently patient has not been compliant with Brilinta)  - L/RHC sometime this week  - Will hydrate with IVF due to rise in Cr - AM BMPs - AM EKGs - Strict I/Os, daily weights - Continue home heart failure meds: Carvedilol 25 mg BID, Isordil 20 mg TID, Spironolactone 25 mg daily  ACS r/o: Troponin 0.04>0.04>0.05>0.07. EKG without any acute changes this AM. Unlikely ACS event at this time although patient is at very high risk.  - IV Heparin per cardiology/heart failure team - Continue home ASA 81 mg - AM EKGs as  above  CAD/HTN:s/p MI 5 and stents 18 with recurrent in-stent thrombosis. Pt with a significant h/o CAD dating back to his mid-40's with multiple PCI's. s/p recent Inferior STEMI 3/17 due to ISR. BP WNL.  - Continue ASA, statin, ticagrelor (lifelong) and b-blocker - Continuous cardiac monitoring as above - Monitor BP - Cardiology/heart failure team recs as above  HLD: 04/01/15: LDL 115 and HDL 35. Goal LDL <70 -Continue home Crestor 40 and Zetia 10   CKD [id number] 1.85 on admission. Cr now 2.55.Baseline Cr appears to be around 1.7-2.0.  - AM BMP - Avoid nephrotoxic agents  - Stopped IV Lasix 80 mg BID and  Sacubitril-valsartan (entresto) 97-103 mg BID due to increase in Cr  FEN/GI: Heart healthy diet, SLIV Prophylaxis: Home ticagrelor (lifelong), IV Heparin per cardio  Disposition:Continue to monitor inpatient until cleared from cardiology  Subjective:  Vital signs stable overnight. Patient has no complaints this morning and is still in good spirits. Denies any shortness of breath, is still on 3.5L O2. Has been NPO overnight due to thoughts of possibly getting cath. Does endorse a good appetite though. Denies chest pain, headaches, fever, dizziness, nausea.   Objective: Temp:  [97.5 F (36.4 C)-98.4 F (36.9 C)] 98.1 F (36.7 C) (05/10 0619) Pulse Rate:  [84-89] 84 (05/10 0619) Resp:  [16-18] 16 (05/10 0619) BP: (85-120)/(69-84) 114/77 mmHg (05/10 0619) SpO2:  [98 %-100 %] 98 % (05/10 0619) Weight:  [137 lb 11.2 oz (62.46 kg)] 137 lb 11.2 oz (62.46 kg) (05/10 [id number]) Physical Exam: General: Pleasant well developed male, laying in bed,  in NAD Cardiovascular: RRR, no murmur. No edema. 2+ dorsalis pedis pulse bilaterally Respiratory: No respiratory distress, CTAB, no crackles appreciated. On 3.5 L [place] Abdomen: soft, non distended, non tender, normal bowel sounds Extremities: no edema  Laboratory:  Recent Labs Lab 05/24/15 1158 05/25/15 0308 05/26/15 0239  WBC 7.9 7.3  5.9  HGB 13.5 13.8 14.8  HCT 41.0 41.5 45.1  PLT 166 170 180    Recent Labs Lab 05/24/15 1158 05/25/15 0308 05/26/15 0239  NA 144 143 142  K 4.4 3.4* 3.7  CL 112* 105 103  CO2 20* 25 25  BUN 18 26* 43*  CREATININE 1.85* 2.55* 2.71*  CALCIUM 9.4 9.3 9.5  PROT 7.4  --   --   BILITOT 1.2  --   --   ALKPHOS 53  --   --   ALT 32  --   --   AST 32  --   --   GLUCOSE 119* 119* 119*    Troponin: 0.04>0.04>0.05>0.07  Imaging/Diagnostic Tests: No results found.   Carlyle Dolly, MD 05/26/2015, 6:55 AM PGY-1, [place] Intern pager: [phone], text pages welcome

## 2015-05-26 NOTE — Progress Notes (Signed)
Creatinine up as well as BUN  Will give 54mL/hr NS x 8 hrs and reschedule cath for later this week.   Full note to come.     Legrand Como [address] [place], PA-C 05/26/2015 8:03 AM

## 2015-05-26 NOTE — Progress Notes (Signed)
This evening patient have 5 beats of V.tach, patient asymptomatic, v/s stable. Will continue to monitor patient.

## 2015-05-26 NOTE — Progress Notes (Signed)
ANTICOAGULATION CONSULT NOTE - Follow Up Consult  Pharmacy Consult for heparin Indication: chest pain/ACS   Patient Measurements: Height: 5\' 7"  (170.2 cm) Weight: 148 lb (67.132 kg) IBW/kg (Calculated) : 66.1 Heparin Dosing Weight: 67 kg   Labs:  Recent Labs  05/24/15 1158 05/24/15 1237 05/24/15 1627  05/25/15 0308 05/25/15 1054 05/25/15 1925 05/26/15 0239 05/26/15 0240 05/26/15 0451  HGB 13.5  --   --   --  13.8  --   --  14.8  --   --   HCT 41.0  --   --   --  41.5  --   --  45.1  --   --   PLT 166  --   --   --  170  --   --  180  --   --   APTT 30  --   --   --   --   --   --   --   --   --   LABPROT  --   --   --   --   --   --   --   --   --  14.4  INR  --   --   --   --   --   --   --   --   --  1.10  HEPARINUNFRC  --   --   --   < > 0.64 0.83* 0.51  --  0.35  --   CREATININE 1.85*  --   --   --  2.55*  --   --  2.71*  --   --   TROPONINI 0.04* 0.04* 0.05*  --   --  0.07*  --   --   --   --   < > = values in this interval not displayed.   Assessment: 50yo male on heparin with initial dosing for CP. Heparin drip 700 uts/hr heparin level at goal 0.35.  Hgb stable at 13 and pltc wnl at 170K.  Noted that Urine darkening some and patient was up most of the night urinating- Diuresing.  RN reports no blood in urine and no other bleeding note.   .  Plan:  Continue heparin 700 units/hr Check HL in 6 hours Daily HL, CBC  Bonnita Nasuti Pharm.D. CPP, BCPS Clinical Pharmacist [phone] 05/26/2015 9:24 AM

## 2015-05-26 NOTE — Progress Notes (Addendum)
Advanced Heart Failure Rounding Note  PCP: None Primary Cardiologist:  Dr. Haroldine Laws / Dr. Caryl Comes.  Subjective:    Feels good this am. Walked with CR for 860 feet. Creatinine bumped, urine output down slightly.   Weight down 11 lbs overall.   Creatinine 1.85 -> 2.55 -> 2.71  Objective:   Weight Range: 137 lb 11.2 oz (62.46 kg) Body mass index is 21.56 kg/(m^2).   Vital Signs:   Temp:  [97.5 F (36.4 C)-98.4 F (36.9 C)] 98.1 F (36.7 C) (05/10 0619) Pulse Rate:  [84-89] 84 (05/10 0619) Resp:  [16-18] 16 (05/10 0619) BP: (85-120)/(69-84) 114/77 mmHg (05/10 0619) SpO2:  [98 %-100 %] 98 % (05/10 0619) Weight:  [137 lb 11.2 oz (62.46 kg)] 137 lb 11.2 oz (62.46 kg) (05/10 0614) Last BM Date: 05/25/15  Weight change: Filed Weights   05/24/15 0954 05/25/15 0621 05/26/15 0614  Weight: 148 lb (67.132 kg) 135 lb 9.6 oz (61.508 kg) 137 lb 11.2 oz (62.46 kg)    Intake/Output:   Intake/Output Summary (Last 24 hours) at 05/26/15 0848 Last data filed at 05/26/15 [id number]  Gross per 24 hour  Intake 736.57 ml  Output    825 ml  Net -88.43 ml     Physical Exam: General:  Well appearing. No resp difficulty HEENT: normal Neck: supple. JVP not elevated . Carotids 2+ bilat; no bruits. No thyromegaly or nodule noted Cor: PMI nondisplaced. RRR, No M/G/R Lungs: Clear, normal Abdomen: soft, NT, ND, no HSM. No bruits or masses. +BS  Extremities: no cyanosis, clubbing, rash, edema Neuro: alert & orientedx3, cranial nerves grossly intact. moves all 4 extremities w/o difficulty. Affect pleasant  Telemetry: NSR  Labs: CBC  Recent Labs  05/25/15 0308 05/26/15 0239  WBC 7.3 5.9  HGB 13.8 14.8  HCT 41.5 45.1  MCV 100.7* 100.2*  PLT 170 [id number]   Basic Metabolic Panel  Recent Labs  05/25/15 0308 05/25/15 1054 05/26/15 0239  NA 143  --  142  K 3.4*  --  3.7  CL 105  --  103  CO2 25  --  25  GLUCOSE 119*  --  119*  BUN 26*  --  43*  CREATININE 2.55*  --  2.71*  CALCIUM 9.3   --  9.5  MG  --  1.7  --    Liver Function Tests  Recent Labs  05/24/15 1158  AST 32  ALT 32  ALKPHOS 53  BILITOT 1.2  PROT 7.4  ALBUMIN 4.0   No results for input(s): LIPASE, AMYLASE in the last 72 hours. Cardiac Enzymes  Recent Labs  05/24/15 1237 05/24/15 1627 05/25/15 1054  TROPONINI 0.04* 0.05* 0.07*    BNP: BNP (last 3 results)  Recent Labs  05/24/15 1159  BNP >4500.0*    ProBNP (last 3 results) No results for input(s): PROBNP in the last 8760 hours.   D-Dimer No results for input(s): DDIMER in the last 72 hours. Hemoglobin A1C No results for input(s): HGBA1C in the last 72 hours. Fasting Lipid Panel No results for input(s): CHOL, HDL, LDLCALC, TRIG, CHOLHDL, LDLDIRECT in the last 72 hours. Thyroid Function Tests No results for input(s): TSH, T4TOTAL, T3FREE, THYROIDAB in the last 72 hours.  Invalid input(s): FREET3  Other results:     Imaging/Studies:  Dg Chest 2 View  05/24/2015  CLINICAL DATA:  Shortness of breath and chronic cough. Symptoms are worse today. Chest pain. EXAM: CHEST - 2 VIEW COMPARISON:  One-view chest x-ray 03/31/2015. FINDINGS:  Mild cardiomegaly is stable. AICD and pacing wires are stable. Multiple coronary stents are noted. Mild pulmonary vascular congestion is similar to the prior exam. There are no effusions. No focal airspace consolidation is present. The visualized soft tissues and bony thorax are unremarkable. IMPRESSION: 1. Stable cardiomegaly and mild pulmonary vascular congestion. 2. No focal airspace disease. 3. Stable AICD and pacing wires. Electronically Signed   By: San Morelle M.D.   On: 05/24/2015 10:59    Latest Echo  Latest Cath   Medications:     Scheduled Medications: . allopurinol  100 mg Oral BID  . amLODipine  5 mg Oral Daily  . aspirin EC  81 mg Oral Daily  . calcitRIOL  0.25 mcg Oral Q M,W,F  . carvedilol  25 mg Oral BID WC  . ezetimibe  10 mg Oral Daily  . hydrALAZINE  25 mg Oral  TID  . isosorbide dinitrate  20 mg Oral TID  . pantoprazole  40 mg Oral Daily  . rosuvastatin  40 mg Oral q1800  . ticagrelor  90 mg Oral BID    Infusions: . [START ON 05/27/2015] sodium chloride    . sodium chloride    . heparin 700 Units/hr (05/25/15 1416)    PRN Medications: sodium chloride, acetaminophen **OR** acetaminophen, hydrALAZINE, nitroGLYCERIN, polyethylene glycol   Assessment   1. Acute on chronic systolic CHF 2. Hypertensive crisis with acute HF 3. Chest pain 4. CAD - MI x 5, Stents x 18 5. HLD 6. AKI on CKD  Plan    He diuresed very well.  Now likely dry with AKI.   Continue to hold lasix and nephrotoxic drugs today. (No Entresto or Arlyce Harman)  Will give IV fluid today with rise in BUN and Creatinine.  Will plan for [facility] later this week. Will use minimal dye and aim for only 1 picture of RCA for angiography.   Cardiac Rehab following.  Dispo: Rehydrate today, likely cath tomorrow, possibly home Friday.   Length of Stay: 2   Shirley Friar PA-C 05/26/2015, 8:48 AM  Advanced Heart Failure Team Pager [phone] (M-F; 7a - 4p)  Please contact [facility] for night-coverage after hours (4p -7a ) and weekends on [email or web address]  Patient seen and examined with Oda Kilts, PA-C. We discussed all aspects of the encounter. I agree with the assessment and plan as stated above.   Feels much better. No CP or SOB. Creatinine worse. Will cancel cath. Hydrate gently. Possible cath tomorrow. Walking with CR. Can stop heparin and switch to DVT prophylaxis.   Kashton Mcartor,MD 3:57 PM

## 2015-05-26 NOTE — Progress Notes (Signed)
CARDIAC REHAB PHASE I   PRE:  Rate/Rhythm: 81 SR  BP:  Sitting: 96/69        SaO2: 100 3.5L, 99 RA  MODE:  Ambulation: 1260 ft   POST:  Rate/Rhythm: 86 SR  BP:  Sitting: 102/71         SaO2: 98 RA  Pt in bed, on 3.5L O2 for unknown reason. Pt sats stable on RA. Pt ambulated 1260 ft on RA, IV, independent, steady gait, tolerated well. Pt denies any complaints. Pt to bed per pt request after walk, call bell within reach. Will follow.    [place], RN, BSN 05/26/2015 1:58 PM

## 2015-05-26 NOTE — Hospital Discharge Follow-Up (Signed)
Transitional Care Clinic Care Coordination Note:  Admit date:  05/24/15 Discharge date: TBD Discharge Disposition: TBD Patient contact: [phone] (home); [phone] (mobile) Emergency contact(s): none  This Case Manager reviewed patient's EMR and determined patient would benefit from post-discharge medical management and chronic care management services through the [facility]. Patient has a history of coronary artery disease (s/p MI x5, stents x18), CKD Stage III, CHF, HLD. Admitted for CHF exacerbation. This Case Manager met with patient to discuss the services and medical management that can be provided at the [facility]. Patient verbalized understanding and agreed to receive post-discharge care at the [facility].   Patient scheduled for Transitional Care appointment on 06/03/15 at 1115 with Dr. Jarold Song.  Clinic information and appointment time provided to patient. Appointment information also placed on AVS.  Assessment:       Home Environment: Patient lives alone in a private residence. Patient recently  moved from [place] to [place] (in November 2016).       Support System: family       Level of functioning: Independent       Home DME: cane       Home care services: none       Transportation: Patient indicates he typically drives to his medical appointments. However, he indicates he recently completed [employer] application for transportation to appointments in case he ever is too weak or unable to drive to medical appointments.  Patient may need transportation to [facility] appointment if [employer] application still pending. Will determine after discharge.        Food/Nutrition: Patient indicates he is on a fixed income so he sometimes does not have enough money for food. Encouraged patient to go to [employer] to determine if eligible for [employer]. In addition, gave patient a list of places in [place] that serve  free meals.        Medications: Patient indicates he gets his medications from [facility] on [address]. Patient denies problems obtaining or affording his medications.        Identified Barriers: lack of PCP, ? Transportation, food resources        PCP: none. Patient will need to establish care with a PCP at [facility] (or designate Dr. Jarold Song as PCP) after 30 days of medical management with the [facility].             Arranged services:        Services communicated to Olga Coaster, RN CM

## 2015-05-27 ENCOUNTER — Encounter (HOSPITAL_COMMUNITY)
Admission: EM | Disposition: A | Discharge status: Home / Self Care | Payer: Self-pay | Source: Home / Self Care | Attending: Family Medicine

## 2015-05-27 DIAGNOSIS — I2510 Atherosclerotic heart disease of native coronary artery without angina pectoris: Secondary | ICD-10-CM

## 2015-05-27 HISTORY — PX: CARDIAC CATHETERIZATION: SHX172

## 2015-05-27 LAB — POCT I-STAT 3, ART BLOOD GAS (G3+)
Acid-base deficit: 1 mmol/L (ref 0.0–2.0)
BICARBONATE: 24 meq/L (ref 20.0–24.0)
O2 Saturation: 94 %
PCO2 ART: 41.1 mmHg (ref 35.0–45.0)
PH ART: 7.375 (ref 7.350–7.450)
PO2 ART: 71 mmHg — AB (ref 80.0–100.0)
TCO2: 25 mmol/L (ref 0–100)

## 2015-05-27 LAB — POCT I-STAT 3, VENOUS BLOOD GAS (G3P V)
Acid-Base Excess: 1 mmol/L (ref 0.0–2.0)
BICARBONATE: 25.5 meq/L — AB (ref 20.0–24.0)
BICARBONATE: 26.7 meq/L — AB (ref 20.0–24.0)
O2 Saturation: 60 %
O2 Saturation: 60 %
PCO2 VEN: 45.3 mmHg (ref 45.0–50.0)
PH VEN: 7.378 — AB (ref 7.250–7.300)
PO2 VEN: 32 mmHg (ref 31.0–45.0)
PO2 VEN: 32 mmHg (ref 31.0–45.0)
TCO2: 27 mmol/L (ref 0–100)
TCO2: 28 mmol/L (ref 0–100)
pCO2, Ven: 43 mmHg — ABNORMAL LOW (ref 45.0–50.0)
pH, Ven: 7.38 — ABNORMAL HIGH (ref 7.250–7.300)

## 2015-05-27 LAB — BASIC METABOLIC PANEL
ANION GAP: 12 (ref 5–15)
BUN: 42 mg/dL — ABNORMAL HIGH (ref 6–20)
CALCIUM: 9.3 mg/dL (ref 8.9–10.3)
CO2: 24 mmol/L (ref 22–32)
Chloride: 103 mmol/L (ref 101–111)
Creatinine, Ser: 2.26 mg/dL — ABNORMAL HIGH (ref 0.61–1.24)
GFR, EST AFRICAN AMERICAN: 37 mL/min — AB (ref >60)
GFR, EST NON AFRICAN AMERICAN: 32 mL/min — AB (ref >60)
Glucose, Bld: 105 mg/dL — ABNORMAL HIGH (ref 65–99)
POTASSIUM: 3.5 mmol/L (ref 3.5–5.1)
Sodium: 139 mmol/L (ref 135–145)

## 2015-05-27 LAB — CBC
HCT: 44.8 % (ref 39.0–52.0)
HEMOGLOBIN: 15.2 g/dL (ref 13.0–17.0)
MCH: 34.5 pg — ABNORMAL HIGH (ref 26.0–34.0)
MCHC: 33.9 g/dL (ref 30.0–36.0)
MCV: 101.8 fL — AB (ref 78.0–100.0)
PLATELETS: 187 10*3/uL (ref 150–400)
RBC: 4.4 MIL/uL (ref 4.22–5.81)
RDW: 12.8 % (ref 11.5–15.5)
WBC: 5.8 10*3/uL (ref 4.0–10.5)

## 2015-05-27 LAB — MAGNESIUM: MAGNESIUM: 1.8 mg/dL (ref 1.7–2.4)

## 2015-05-27 LAB — HEPARIN LEVEL (UNFRACTIONATED): Heparin Unfractionated: 0.56 IU/mL (ref 0.30–0.70)

## 2015-05-27 SURGERY — RIGHT/LEFT HEART CATH AND CORONARY ANGIOGRAPHY

## 2015-05-27 MED ORDER — MIDAZOLAM HCL 2 MG/2ML IJ SOLN
INTRAMUSCULAR | Status: AC
  Filled 2015-05-27: qty 2

## 2015-05-27 MED ORDER — VERAPAMIL HCL 2.5 MG/ML IV SOLN
INTRAVENOUS | Status: DC | PRN
  Administered 2015-05-27: 14:00:00 via INTRA_ARTERIAL

## 2015-05-27 MED ORDER — VERAPAMIL HCL 2.5 MG/ML IV SOLN
INTRAVENOUS | Status: AC
  Filled 2015-05-27: qty 2

## 2015-05-27 MED ORDER — MIDAZOLAM HCL 2 MG/2ML IJ SOLN
INTRAMUSCULAR | Status: DC | PRN
  Administered 2015-05-27: 1 mg via INTRAVENOUS

## 2015-05-27 MED ORDER — ENOXAPARIN SODIUM 40 MG/0.4ML ~~LOC~~ SOLN
40.0000 mg | SUBCUTANEOUS | Status: DC
  Administered 2015-05-28: 40 mg via SUBCUTANEOUS
  Filled 2015-05-27: qty 0.4

## 2015-05-27 MED ORDER — HEPARIN SODIUM (PORCINE) 1000 UNIT/ML IJ SOLN
INTRAMUSCULAR | Status: AC
  Filled 2015-05-27: qty 1

## 2015-05-27 MED ORDER — SODIUM CHLORIDE 0.9 % IV SOLN: 250.0000 mL | INTRAVENOUS | Status: DC | PRN

## 2015-05-27 MED ORDER — ACETAMINOPHEN 325 MG PO TABS: 650.0000 mg | ORAL_TABLET | ORAL | Status: DC | PRN

## 2015-05-27 MED ORDER — HEPARIN (PORCINE) IN NACL 2-0.9 UNIT/ML-% IJ SOLN
INTRAMUSCULAR | Status: DC | PRN
  Administered 2015-05-27: 1000 mL

## 2015-05-27 MED ORDER — ONDANSETRON HCL 4 MG/2ML IJ SOLN: 4.0000 mg | Freq: Four times a day (QID) | INTRAMUSCULAR | Status: DC | PRN

## 2015-05-27 MED ORDER — CARVEDILOL 25 MG PO TABS
25.0000 mg | ORAL_TABLET | Freq: Two times a day (BID) | ORAL | Status: DC
  Administered 2015-05-27 – 2015-05-28 (×3): 25 mg via ORAL
  Filled 2015-05-27 (×3): qty 1

## 2015-05-27 MED ORDER — IOPAMIDOL (ISOVUE-370) INJECTION 76%
INTRAVENOUS | Status: DC | PRN
  Administered 2015-05-27: 15 mL via INTRA_ARTERIAL

## 2015-05-27 MED ORDER — IOPAMIDOL (ISOVUE-370) INJECTION 76%
INTRAVENOUS | Status: AC
  Filled 2015-05-27: qty 100

## 2015-05-27 MED ORDER — SODIUM CHLORIDE 0.9% FLUSH: 3.0000 mL | Freq: Two times a day (BID) | INTRAVENOUS | Status: DC

## 2015-05-27 MED ORDER — LIDOCAINE HCL (PF) 1 % IJ SOLN
INTRAMUSCULAR | Status: AC
  Filled 2015-05-27: qty 30

## 2015-05-27 MED ORDER — FENTANYL CITRATE (PF) 100 MCG/2ML IJ SOLN
INTRAMUSCULAR | Status: DC | PRN
  Administered 2015-05-27: 25 ug via INTRAVENOUS

## 2015-05-27 MED ORDER — SODIUM CHLORIDE 0.9% FLUSH: 3.0000 mL | INTRAVENOUS | Status: DC | PRN

## 2015-05-27 MED ORDER — LIDOCAINE HCL (PF) 1 % IJ SOLN
INTRAMUSCULAR | Status: DC | PRN
  Administered 2015-05-27: 2 mL

## 2015-05-27 MED ORDER — ENOXAPARIN SODIUM 40 MG/0.4ML ~~LOC~~ SOLN: 40.0000 mg | SUBCUTANEOUS | Status: DC

## 2015-05-27 MED ORDER — HEPARIN (PORCINE) IN NACL 2-0.9 UNIT/ML-% IJ SOLN
INTRAMUSCULAR | Status: AC
  Filled 2015-05-27: qty 1000

## 2015-05-27 MED ORDER — FENTANYL CITRATE (PF) 100 MCG/2ML IJ SOLN
INTRAMUSCULAR | Status: AC
  Filled 2015-05-27: qty 2

## 2015-05-27 MED ORDER — SODIUM CHLORIDE 0.9 % IV SOLN: INTRAVENOUS | Status: DC

## 2015-05-27 SURGICAL SUPPLY — 14 items
CATH BALLN WEDGE 5F 110CM (CATHETERS) ×3 IMPLANT
CATH INFINITI 5 FR JL3.5 (CATHETERS) IMPLANT
CATH INFINITI 5FR ANG PIGTAIL (CATHETERS) IMPLANT
CATH INFINITI JR4 5F (CATHETERS) ×3 IMPLANT
DEVICE RAD COMP TR BAND LRG (VASCULAR PRODUCTS) ×3 IMPLANT
GLIDESHEATH SLEND SS 6F .021 (SHEATH) ×6 IMPLANT
GUIDEWIRE .025 260CM (WIRE) ×3 IMPLANT
KIT HEART LEFT (KITS) ×3 IMPLANT
PACK CARDIAC CATHETERIZATION (CUSTOM PROCEDURE TRAY) ×3 IMPLANT
SHEATH FAST CATH BRACH 5F 5CM (SHEATH) ×6 IMPLANT
TRANSDUCER W/STOPCOCK (MISCELLANEOUS) ×3 IMPLANT
TUBING CIL FLEX 10 FLL-RA (TUBING) ×3 IMPLANT
WIRE HI TORQ VERSACORE-J 145CM (WIRE) ×3 IMPLANT
WIRE SAFE-T 1.5MM-J .035X260CM (WIRE) ×3 IMPLANT

## 2015-05-27 NOTE — Progress Notes (Signed)
Site area: Right brachial 6 french venous sheath was removed  Site Prior to Removal:  Level 0  Pressure Applied For 10 MINUTES    Minutes Beginning at 1520p  Manual:   Yes.    Patient Status During Pull:  stable  Post Pull Groin Site:  Level 0  Post Pull Instructions Given:  Yes.    Post Pull Pulses Present:  Yes.    Dressing Applied:  Yes.    Comments:  VS remain stable

## 2015-05-27 NOTE — Care Management Important Message (Signed)
Important Message  Patient Details  Name: Marvin Martin MRN: [id number] Date of Birth: Nov 23, 1965   Medicare Important Message Given:  Yes    Latoia Eyster P Nhyira Leano 05/27/2015, 2:32 PM

## 2015-05-27 NOTE — Progress Notes (Signed)
Family Medicine Teaching Service Daily Progress Note Intern Pager: [phone]  Patient name: Marvin Martin Medical record number: [id number] Date of birth: 1965/02/20 Age: 50 y.o. Gender: male  Primary Care Provider: No PCP Per Patient Consultants: cardiology/heart failure team Code Status: Full (per discussion on admission)  Pt Overview and Major Events to Date:  5/8: Admit to FPTS 5/9: Diuresed, Lasix and Entresto stopped due to rising Cr 5/10: Awaiting cath   Assessment and Plan: Marvin Martin is a 50 y.o. male presenting with shortness of breath and chest pain . PMH is significant for CAD (s/p MI 5 and stents 18 with recurrent in-stent thrombosis), ICM, HFrEF, HLD, and CKD III.   CHF Exacerbation: Resolved crackles in bilateral lower lung fields, no other signs of fluid overload. He is seen by Dr. Haroldine Laws for heart failure.UO: 475cc overnight (has had some unrecorded voids). 148 lbs on admission, 137 lbs yesterday, and 138 lbs today. Cr has risen from 1.85>2.55>2.71 but now 2.26. Lasix 80 mg BID and Sacubitril-valsartan (entresto) 97-103 mg BID stopped on 5/9 due to increase in Cr. No longer has O2 requirement. 1 L of fluid out out over last 24 hours.  - Vital signs per floor protocol - Continuous cardiac monitoring - Cardiology/heart failure team consulted, appreciate their assistance  - Stop IV Heparin drip (was started on 5/8 due to high risk for stent occlusion (apparently patient has not been compliant with Brilinta) and switch to DVT prophylaxis  - L/RHC sometime this week  - s/p IVF due to rising Bun/Cr - AM BMPs  - AM EKGs - Strict I/Os, daily weights - Continue home heart failure meds: Carvedilol 25 mg BID, Isordil 20 mg TID, Spironolactone 25 mg daily  ACS r/o: Troponin 0.04>0.04>0.05>0.07. EKG without any acute changes this AM. Unlikely ACS event at this time although patient is at very high risk.  - s/p IV Heparin per cardiology/heart failure team - Continue  home ASA 81 mg - AM EKGs as above  CAD/HTN:s/p MI 5 and stents 18 with recurrent in-stent thrombosis. Pt with a significant h/o CAD dating back to his mid-40's with multiple PCI's. s/p recent Inferior STEMI 3/17 due to ISR. BP WNL.  - Continue ASA, statin, ticagrelor (lifelong) and b-blocker - Continuous cardiac monitoring as above - Monitor BP - Cardiology/heart failure team recs as above  HLD: 04/01/15: LDL 115 and HDL 35. Goal LDL <70 -Continue home Crestor 40 and Zetia 10   CKD [id number] 1.85 on admission. Cr now 2.26 .Baseline Cr appears to be around 1.7-2.0.  - AM BMP - Avoid nephrotoxic agents  - Continue to hold IV Lasix 80 mg BID and Sacubitril-valsartan (entresto) 97-103 mg BID due to increase in Cr  FEN/GI: Heart healthy diet, SLIV Prophylaxis: Home ticagrelor (lifelong), IV Heparin per cardio  Disposition:Continue to monitor inpatient until cleared from cardiology  Subjective:  Vital signs stable overnight. Patient has no complaints this morning and is still in good spirits. Denies any shortness of breath, is now on room air. Has been NPO overnight due to thoughts of cath today. Does endorse a good appetite though. Denies chest pain, headaches, fever, dizziness, nausea. Last BM yesterday.  Objective: Temp:  [98.4 F (36.9 C)-98.7 F (37.1 C)] 98.6 F (37 C) (05/11 0503) Pulse Rate:  [76-86] 81 (05/11 0503) Resp:  [16-18] 18 (05/11 0503) BP: (100-119)/(70-88) 119/88 mmHg (05/11 0503) SpO2:  [99 %-100 %] 100 % (05/11 0503) Weight:  [138 lb 12.8 oz (62.959 kg)] 138 lb 12.8 oz (62.959 kg) (  05/11 0503) Physical Exam: General: Pleasant well developed male, laying in bed, in NAD Cardiovascular: RRR, no murmur. No edema. 2+ dorsalis pedis pulse bilaterally Respiratory: No respiratory distress, CTAB, no crackles appreciated. On 3.5 L [place] Abdomen: soft, non distended, non tender, normal bowel sounds Extremities: no edema  Laboratory:  Recent Labs Lab 05/25/15 0308  05/26/15 0239 05/27/15 0226  WBC 7.3 5.9 5.8  HGB 13.8 14.8 15.2  HCT 41.5 45.1 44.8  PLT 170 180 187    Recent Labs Lab 05/24/15 1158 05/25/15 0308 05/26/15 0239 05/27/15 0226  NA 144 143 142 139  K 4.4 3.4* 3.7 3.5  CL 112* 105 103 103  CO2 20* 25 25 24   BUN 18 26* 43* 42*  CREATININE 1.85* 2.55* 2.71* 2.26*  CALCIUM 9.4 9.3 9.5 9.3  PROT 7.4  --   --   --   BILITOT 1.2  --   --   --   ALKPHOS 53  --   --   --   ALT 32  --   --   --   AST 32  --   --   --   GLUCOSE 119* 119* 119* 105*    Troponin: 0.04>0.04>0.05>0.07  Imaging/Diagnostic Tests: No results found.   Carlyle Dolly, MD 05/27/2015, 6:57 AM PGY-1, [facility] Intern pager: [phone], text pages welcome

## 2015-05-27 NOTE — H&P (View-Only) (Signed)
Advanced Heart Failure Rounding Note  PCP: None Primary Cardiologist:  Dr. Haroldine Laws / Dr. Caryl Comes.  Subjective:    Feels good this am. Walked with CR for 860 feet. Creatinine bumped, urine output down slightly.   Weight down 11 lbs overall.   Creatinine 1.85 -> 2.55 -> 2.71  Objective:   Weight Range: 137 lb 11.2 oz (62.46 kg) Body mass index is 21.56 kg/(m^2).   Vital Signs:   Temp:  [97.5 F (36.4 C)-98.4 F (36.9 C)] 98.1 F (36.7 C) (05/10 0619) Pulse Rate:  [84-89] 84 (05/10 0619) Resp:  [16-18] 16 (05/10 0619) BP: (85-120)/(69-84) 114/77 mmHg (05/10 0619) SpO2:  [98 %-100 %] 98 % (05/10 0619) Weight:  [137 lb 11.2 oz (62.46 kg)] 137 lb 11.2 oz (62.46 kg) (05/10 0614) Last BM Date: 05/25/15  Weight change: Filed Weights   05/24/15 0954 05/25/15 0621 05/26/15 0614  Weight: 148 lb (67.132 kg) 135 lb 9.6 oz (61.508 kg) 137 lb 11.2 oz (62.46 kg)    Intake/Output:   Intake/Output Summary (Last 24 hours) at 05/26/15 0848 Last data filed at 05/26/15 [id number]  Gross per 24 hour  Intake 736.57 ml  Output    825 ml  Net -88.43 ml     Physical Exam: General:  Well appearing. No resp difficulty HEENT: normal Neck: supple. JVP not elevated . Carotids 2+ bilat; no bruits. No thyromegaly or nodule noted Cor: PMI nondisplaced. RRR, No M/G/R Lungs: Clear, normal Abdomen: soft, NT, ND, no HSM. No bruits or masses. +BS  Extremities: no cyanosis, clubbing, rash, edema Neuro: alert & orientedx3, cranial nerves grossly intact. moves all 4 extremities w/o difficulty. Affect pleasant  Telemetry: NSR  Labs: CBC  Recent Labs  05/25/15 0308 05/26/15 0239  WBC 7.3 5.9  HGB 13.8 14.8  HCT 41.5 45.1  MCV 100.7* 100.2*  PLT 170 99991111   Basic Metabolic Panel  Recent Labs  05/25/15 0308 05/25/15 1054 05/26/15 0239  NA 143  --  142  K 3.4*  --  3.7  CL 105  --  103  CO2 25  --  25  GLUCOSE 119*  --  119*  BUN 26*  --  43*  CREATININE 2.55*  --  2.71*  CALCIUM 9.3   --  9.5  MG  --  1.7  --    Liver Function Tests  Recent Labs  05/24/15 1158  AST 32  ALT 32  ALKPHOS 53  BILITOT 1.2  PROT 7.4  ALBUMIN 4.0   No results for input(s): LIPASE, AMYLASE in the last 72 hours. Cardiac Enzymes  Recent Labs  05/24/15 1237 05/24/15 1627 05/25/15 1054  TROPONINI 0.04* 0.05* 0.07*    BNP: BNP (last 3 results)  Recent Labs  05/24/15 1159  BNP >4500.0*    ProBNP (last 3 results) No results for input(s): PROBNP in the last 8760 hours.   D-Dimer No results for input(s): DDIMER in the last 72 hours. Hemoglobin A1C No results for input(s): HGBA1C in the last 72 hours. Fasting Lipid Panel No results for input(s): CHOL, HDL, LDLCALC, TRIG, CHOLHDL, LDLDIRECT in the last 72 hours. Thyroid Function Tests No results for input(s): TSH, T4TOTAL, T3FREE, THYROIDAB in the last 72 hours.  Invalid input(s): FREET3  Other results:     Imaging/Studies:  Dg Chest 2 View  05/24/2015  CLINICAL DATA:  Shortness of breath and chronic cough. Symptoms are worse today. Chest pain. EXAM: CHEST - 2 VIEW COMPARISON:  One-view chest x-ray 03/31/2015. FINDINGS:  Mild cardiomegaly is stable. AICD and pacing wires are stable. Multiple coronary stents are noted. Mild pulmonary vascular congestion is similar to the prior exam. There are no effusions. No focal airspace consolidation is present. The visualized soft tissues and bony thorax are unremarkable. IMPRESSION: 1. Stable cardiomegaly and mild pulmonary vascular congestion. 2. No focal airspace disease. 3. Stable AICD and pacing wires. Electronically Signed   By: San Morelle M.D.   On: 05/24/2015 10:59    Latest Echo  Latest Cath   Medications:     Scheduled Medications: . allopurinol  100 mg Oral BID  . amLODipine  5 mg Oral Daily  . aspirin EC  81 mg Oral Daily  . calcitRIOL  0.25 mcg Oral Q M,W,F  . carvedilol  25 mg Oral BID WC  . ezetimibe  10 mg Oral Daily  . hydrALAZINE  25 mg Oral  TID  . isosorbide dinitrate  20 mg Oral TID  . pantoprazole  40 mg Oral Daily  . rosuvastatin  40 mg Oral q1800  . ticagrelor  90 mg Oral BID    Infusions: . [START ON 05/27/2015] sodium chloride    . sodium chloride    . heparin 700 Units/hr (05/25/15 1416)    PRN Medications: sodium chloride, acetaminophen **OR** acetaminophen, hydrALAZINE, nitroGLYCERIN, polyethylene glycol   Assessment   1. Acute on chronic systolic CHF 2. Hypertensive crisis with acute HF 3. Chest pain 4. CAD - MI x 5, Stents x 18 5. HLD 6. AKI on CKD  Plan    He diuresed very well.  Now likely dry with AKI.   Continue to hold lasix and nephrotoxic drugs today. (No Entresto or Arlyce Harman)  Will give IV fluid today with rise in BUN and Creatinine.  Will plan for [facility] later this week. Will use minimal dye and aim for only 1 picture of RCA for angiography.   Cardiac Rehab following.  Dispo: Rehydrate today, likely cath tomorrow, possibly home Friday.   Length of Stay: 2   Shirley Friar PA-C 05/26/2015, 8:48 AM  Advanced Heart Failure Team Pager [phone] (M-F; 7a - 4p)  Please contact [facility] for night-coverage after hours (4p -7a ) and weekends on [email or web address]  Patient seen and examined with Oda Kilts, PA-C. We discussed all aspects of the encounter. I agree with the assessment and plan as stated above.   Feels much better. No CP or SOB. Creatinine worse. Will cancel cath. Hydrate gently. Possible cath tomorrow. Walking with CR. Can stop heparin and switch to DVT prophylaxis.   Marvin Parcell,MD 3:57 PM

## 2015-05-27 NOTE — Progress Notes (Signed)
Pt with 5 beats of V tach at 22:26 last night. Pt asymptomatic and resting comfortably. BMET already ordered for the AM. MD paged. Orders received for Mag level in AM. Will continue to monitor.

## 2015-05-27 NOTE — Interval H&P Note (Signed)
History and Physical Interval Note:  05/27/2015 1:28 PM  Marvin Martin  has presented today for surgery, with the diagnosis of hf  The various methods of treatment have been discussed with the patient and family. After consideration of risks, benefits and other options for treatment, the patient has consented to  Procedure(s): Right/Left Heart Cath and Coronary Angiography (N/A) and possible angioplasty as a surgical intervention .  The patient's history has been reviewed, patient examined, no change in status, stable for surgery.  I have reviewed the patient's chart and labs.  Questions were answered to the patient's satisfaction.     Bensimhon, Quillian Quince

## 2015-05-27 NOTE — Progress Notes (Signed)
Advanced Heart Failure Rounding Note  PCP: None Primary Cardiologist:  Dr. Haroldine Laws / Dr. Caryl Comes.  Subjective:    No complaints this am. Awaiting cath this afternoon. Feels good. He states he is willing to stay as long as it takes.  Weight down 10 lbs overall.   Creatinine improved with fluid resuscitation 1.85 -> 2.55 -> 2.71 -> 2.2  Objective:   Weight Range: 138 lb 12.8 oz (62.959 kg) Body mass index is 21.73 kg/(m^2).   Vital Signs:   Temp:  [98.4 F (36.9 C)-98.7 F (37.1 C)] 98.6 F (37 C) (05/11 0503) Pulse Rate:  [76-86] 81 (05/11 0503) Resp:  [16-18] 18 (05/11 0503) BP: (100-119)/(70-88) 119/88 mmHg (05/11 0503) SpO2:  [99 %-100 %] 100 % (05/11 0503) Weight:  [138 lb 12.8 oz (62.959 kg)] 138 lb 12.8 oz (62.959 kg) (05/11 0503) Last BM Date: 05/27/15  Weight change: Filed Weights   05/25/15 0621 05/26/15 0614 05/27/15 0503  Weight: 135 lb 9.6 oz (61.508 kg) 137 lb 11.2 oz (62.46 kg) 138 lb 12.8 oz (62.959 kg)    Intake/Output:   Intake/Output Summary (Last 24 hours) at 05/27/15 1024 Last data filed at 05/27/15 0918  Gross per 24 hour  Intake    480 ml  Output    800 ml  Net   -320 ml     Physical Exam: General:  Well appearing. No resp difficulty HEENT: normal Neck: supple. JVP 7-8 . Carotids 2+ bilat; no bruits. No thyromegaly or lymphadenopathy noted Cor: PMI nondisplaced. RRR, No M/G/R Lungs: Clear, normal Abdomen: soft, non-tender, non-distended, no HSM. No bruits or masses. +BS  Extremities: no cyanosis, clubbing, rash, edema Neuro: alert & orientedx3, cranial nerves grossly intact. moves all 4 extremities w/o difficulty. Pleasant affect   Telemetry: Reviewed, NSR  Labs: CBC  Recent Labs  05/26/15 0239 05/27/15 0226  WBC 5.9 5.8  HGB 14.8 15.2  HCT 45.1 44.8  MCV 100.2* 101.8*  PLT 180 [id number]   Basic Metabolic Panel  Recent Labs  05/25/15 1054 05/26/15 0239 05/27/15 0226  NA  --  142 139  K  --  3.7 3.5  CL  --  103 103    CO2  --  25 24  GLUCOSE  --  119* 105*  BUN  --  43* 42*  CREATININE  --  2.71* 2.26*  CALCIUM  --  9.5 9.3  MG 1.7  --   --    Liver Function Tests  Recent Labs  05/24/15 1158  AST 32  ALT 32  ALKPHOS 53  BILITOT 1.2  PROT 7.4  ALBUMIN 4.0   No results for input(s): LIPASE, AMYLASE in the last 72 hours. Cardiac Enzymes  Recent Labs  05/24/15 1237 05/24/15 1627 05/25/15 1054  TROPONINI 0.04* 0.05* 0.07*    BNP: BNP (last 3 results)  Recent Labs  05/24/15 1159  BNP >4500.0*    ProBNP (last 3 results) No results for input(s): PROBNP in the last 8760 hours.   D-Dimer No results for input(s): DDIMER in the last 72 hours. Hemoglobin A1C No results for input(s): HGBA1C in the last 72 hours. Fasting Lipid Panel No results for input(s): CHOL, HDL, LDLCALC, TRIG, CHOLHDL, LDLDIRECT in the last 72 hours. Thyroid Function Tests No results for input(s): TSH, T4TOTAL, T3FREE, THYROIDAB in the last 72 hours.  Invalid input(s): FREET3  Other results:     Imaging/Studies:  No results found.  Latest Echo  Latest Cath   Medications:  Scheduled Medications: . allopurinol  100 mg Oral BID  . amLODipine  5 mg Oral Daily  . aspirin EC  81 mg Oral Daily  . calcitRIOL  0.25 mcg Oral Q M,W,F  . carvedilol  25 mg Oral BID WC  . enoxaparin (LOVENOX) injection  40 mg Subcutaneous Q24H  . ezetimibe  10 mg Oral Daily  . hydrALAZINE  25 mg Oral TID  . isosorbide dinitrate  20 mg Oral TID  . pantoprazole  40 mg Oral Daily  . rosuvastatin  40 mg Oral q1800  . sodium chloride flush  3 mL Intravenous Q12H  . sodium chloride flush  3 mL Intravenous Q12H  . ticagrelor  90 mg Oral BID    Infusions: . sodium chloride    . sodium chloride 10 mL/hr at 05/27/15 0801    PRN Medications: sodium chloride, sodium chloride, acetaminophen **OR** acetaminophen, hydrALAZINE, nitroGLYCERIN, polyethylene glycol, sodium chloride flush, sodium chloride  flush   Assessment   1. Acute on chronic systolic CHF 2. Hypertensive crisis with acute HF 3. Chest pain 4. CAD - MI x 5, Stents x 18 5. HLD 6. AKI on CKD  Plan    He diuresed very well.  Now likely dry with AKI.   Creatinine improved with IVF yesterday.  Will proceed with cath this afternoon.  RHC to occur, LHC likely; with minimal dye and aim for only 1 picture of RCA for angiography possible per MD.   Cardiac Rehab following.  Dispo: Likely home tomorrow pending cath results. Will also decide on resumption of diuretics at that point.   Length of Stay: 3   Shirley Friar PA-C 05/27/2015, 10:24 AM  Advanced Heart Failure Team Pager [phone] (M-F; 7a - 4p)  Please contact [facility] for night-coverage after hours (4p -7a ) and weekends on [email or web address]  Patient seen and examined with Oda Kilts, PA-C. We discussed all aspects of the encounter. I agree with the assessment and plan as stated above.   No further CP. Volume status ok. Renal function improving. For cath later today.   Glori Bickers MD

## 2015-05-27 NOTE — Progress Notes (Signed)
CARDIAC REHAB PHASE I   PRE:  Rate/Rhythm: 87 SR  BP:  Supine: 119/85  Sitting:   Standing:    SaO2: 100%RA  MODE:  Ambulation: 1520 ft   POST:  Rate/Rhythm: 95 SR  BP:  Supine: 119/85  Sitting:   Standing:    SaO2: 98%RA 1132-1157 Pt walked 1520 ft on RA with steady gait. Tolerated well. No SOB. In good spirits.   Graylon Good, RN BSN  05/27/2015 11:54 AM

## 2015-05-28 ENCOUNTER — Encounter (HOSPITAL_COMMUNITY): Payer: Self-pay | Admitting: Internal Medicine

## 2015-05-28 LAB — BASIC METABOLIC PANEL
ANION GAP: 15 (ref 5–15)
BUN: 34 mg/dL — ABNORMAL HIGH (ref 6–20)
CALCIUM: 9.1 mg/dL (ref 8.9–10.3)
CHLORIDE: 106 mmol/L (ref 101–111)
CO2: 19 mmol/L — AB (ref 22–32)
Creatinine, Ser: 1.86 mg/dL — ABNORMAL HIGH (ref 0.61–1.24)
GFR calc non Af Amer: 41 mL/min — ABNORMAL LOW (ref >60)
GFR, EST AFRICAN AMERICAN: 47 mL/min — AB (ref >60)
GLUCOSE: 80 mg/dL (ref 65–99)
POTASSIUM: 4.4 mmol/L (ref 3.5–5.1)
Sodium: 140 mmol/L (ref 135–145)

## 2015-05-28 LAB — CBC
HEMATOCRIT: 41.9 % (ref 39.0–52.0)
HEMOGLOBIN: 14.2 g/dL (ref 13.0–17.0)
MCH: 34.5 pg — ABNORMAL HIGH (ref 26.0–34.0)
MCHC: 33.9 g/dL (ref 30.0–36.0)
MCV: 101.7 fL — AB (ref 78.0–100.0)
Platelets: 195 10*3/uL (ref 150–400)
RBC: 4.12 MIL/uL — AB (ref 4.22–5.81)
RDW: 12.8 % (ref 11.5–15.5)
WBC: 4.8 10*3/uL (ref 4.0–10.5)

## 2015-05-28 LAB — HEPARIN LEVEL (UNFRACTIONATED)

## 2015-05-28 MED ORDER — FUROSEMIDE 40 MG PO TABS: 40.0000 mg | ORAL_TABLET | Freq: Every day | ORAL | Status: DC

## 2015-05-28 MED FILL — Heparin Sodium (Porcine) Inj 1000 Unit/ML: INTRAMUSCULAR | Qty: 10 | Status: AC

## 2015-05-28 NOTE — Discharge Instructions (Signed)
You were admitted to the hospital for a heart failure exacerbation. The cardiologists took a look at your heart with a catheter procedure. As of now there is nothing to be done in the hospital, but you will need close follow up with your heart doctor.   Reasons to return to the hospital: chest pain, trouble breathing, shortness of breath, passing out, or lightheadedness.   Please take your medications as prescribed.   It was a pleasure caring for you, take care!  Heart Failure Heart failure means your heart has trouble pumping blood. This makes it hard for your body to work well. Heart failure is usually a long-term (chronic) condition. You must take good care of yourself and follow your doctor's treatment plan. HOME CARE  Take your heart medicine as told by your doctor.  Do not stop taking medicine unless your doctor tells you to.  Do not skip any dose of medicine.  Refill your medicines before they run out.  Take other medicines only as told by your doctor or pharmacist.  Stay active if told by your doctor. The elderly and people with severe heart failure should talk with a doctor about physical activity.  Eat heart-healthy foods. Choose foods that are without trans fat and are low in saturated fat, cholesterol, and salt (sodium). This includes fresh or frozen fruits and vegetables, fish, lean meats, fat-free or low-fat dairy foods, whole grains, and high-fiber foods. Lentils and dried peas and beans (legumes) are also good choices.  Limit salt if told by your doctor.  Cook in a healthy way. Roast, grill, broil, bake, poach, steam, or stir-fry foods.  Limit fluids as told by your doctor.  Weigh yourself every morning. Do this after you pee (urinate) and before you eat breakfast. Write down your weight to give to your doctor.  Take your blood pressure and write it down if your doctor tells you to.  Ask your doctor how to check your pulse. Check your pulse as told.  Lose weight  if told by your doctor.  Stop smoking or chewing tobacco. Do not use gum or patches that help you quit without your doctor's approval.  Schedule and go to doctor visits as told.  Nonpregnant women should have no more than 1 drink a day. Men should have no more than 2 drinks a day. Talk to your doctor about drinking alcohol.  Stop illegal drug use.  Stay current with shots (immunizations).  Manage your health conditions as told by your doctor.  Learn to manage your stress.  Rest when you are tired.  If it is really hot outside:  Avoid intense activities.  Use air conditioning or fans, or get in a cooler place.  Avoid caffeine and alcohol.  Wear loose-fitting, lightweight, and light-colored clothing.  If it is really cold outside:  Avoid intense activities.  Layer your clothing.  Wear mittens or gloves, a hat, and a scarf when going outside.  Avoid alcohol.  Learn about heart failure and get support as needed.  Get help to maintain or improve your quality of life and your ability to care for yourself as needed. GET HELP IF:   You gain weight quickly.  You are more short of breath than usual.  You cannot do your normal activities.  You tire easily.  You cough more than normal, especially with activity.  You have any or more puffiness (swelling) in areas such as your hands, feet, ankles, or belly (abdomen).  You cannot sleep because it  is hard to breathe.  You feel like your heart is beating fast (palpitations).  You get dizzy or light-headed when you stand up. GET HELP RIGHT AWAY IF:   You have trouble breathing.  There is a change in mental status, such as becoming less alert or not being able to focus.  You have chest pain or discomfort.  You faint. MAKE SURE YOU:   Understand these instructions.  Will watch your condition.  Will get help right away if you are not doing well or get worse.   This information is not intended to replace advice  given to you by your health care provider. Make sure you discuss any questions you have with your health care provider.   Document Released: 10/12/2007 Document Revised: 01/23/2014 Document Reviewed: 02/19/2012 Elsevier Interactive Patient Education [employer].

## 2015-05-28 NOTE — Hospital Discharge Follow-Up (Signed)
Transitional [facility] Clinic at [place]:  This Case Manager met with patient at bedside to remind him of the medical management and follow-up provided at the [facility] [place]. Patient reminded of his Transitional Care Clinic appointment on 06/03/15 at 1115 with Dr. Jarold Song. Also reminded patient he would receive a post-discharge follow-up phone call and transportation could be provided to his appointment if SCAT application still pending. Patient verbalized understanding. Will continue to follow patient's clinical progress closely.

## 2015-05-28 NOTE — Discharge Summary (Signed)
[facility] Discharge Summary  Patient name: Marvin Martin Medical record number: [id number] Date of birth: 1965-09-15 Age: 50 y.o. Gender: male Date of Admission: 05/24/2015  Date of Discharge: 05/28/15 Admitting Physician: Zenia Resides, MD  Primary Care Provider: No PCP Per Patient Consultants: Cardiology/heart failure team  Indication for Hospitalization: dyspnea and chest pain  Discharge Diagnoses/Problem List:  Patient Active Problem List   Diagnosis Date Noted  . Acute on chronic systolic congestive heart failure ([place])   . Recurrent angina status post coronary stent placement ([place])   . AKI (acute kidney injury) ([place])   . CHF exacerbation ([place]) 05/24/2015  . CAD (coronary artery disease) 04/03/2015  . Hypertensive heart disease 04/03/2015  . Hyperlipidemia 04/03/2015  . Acute ST elevation myocardial infarction (STEMI) ([place]) 03/31/2015  . ST elevation (STEMI) myocardial infarction involving right coronary artery ([place]) 03/31/2015  . Ischemic chest pain ([place])   . Cardiomyopathy, ischemic   . Chest pressure 02/09/2015  . CKD (chronic kidney disease) stage 3, GFR 30-59 ml/min 02/09/2015  . Chronic systolic heart failure ([place]) 01/07/2015  . CAD in native artery 01/07/2015  . Benign essential HTN 01/07/2015  . CHF (congestive heart failure) ([place]) 12/11/2014    Disposition: Home  Discharge Condition: stable  Discharge Exam: see previous progress note  Brief Hospital Course:  Marvin Martin is a 50 y.o. male who presented with shortness of breath and chest pain . PMH is significant for CAD (s/p MI 5 and stents 18 with recurrent in-stent thrombosis), ICM, HFrEF, HLD, and CKD III.   CHF Exacerbation: Patient initially had O2 requirement requiring CPAP in the ED. He never appeared fluid overloaded on physical exam. CXR revealed Stable cardiomegaly and mild pulmonary vascular congestion. EKG unchanged from previous. BNP >4500. Cardiology was  consulted due to complex cardiac history. Continuous cardiac monitoring was performed as well as strict I/Os and daily weights. He was initially diuresed with Lasix but was then stopped due to rising Creatinine.  Lasix 80 mg BID and Sacubitril-valsartan (entresto) 97-103 mg BID was held until Creatinine improved. He lost a total of ~9 lbs while hospitalized likely due to diuresis. Home heart failure medications were continued throughout admission. Upon discharge, patient was comfortably breathing room air, creatinine improved, and all home medications were resumed.   ACS r/o:  Initially presented with chest pain. Chest pain resolved quickly but due to high risk, ACS needed to be ruled out. Troponins were trended and not significant for MI, EKG did not show any acute changes. Cardiology was consulted and on 5/8 was started on IV Heparin due to high risk for stent occlusion. Cardiac catheterization was performed and revealed RCA re-stenosis but overall results were stable enough for close outpatient follow up.    All other chronic medical conditions stable throughout admission and managed with home regimens.   Issues for Follow Up:  1. Cardiac: Will need close follow up with Dr. Haroldine Laws and a PCP 2. Ensure patient is taking Brilinta (apparently patient had not been compliant with Brilinta prior to admission)  Significant Procedures: 05/27/15: Right/Left cardiac catheterization  Significant Labs and Imaging:   Recent Labs Lab 05/26/15 0239 05/27/15 0226 05/28/15 0508  WBC 5.9 5.8 4.8  HGB 14.8 15.2 14.2  HCT 45.1 44.8 41.9  PLT 180 187 195    Recent Labs Lab 05/24/15 1158 05/25/15 0308 05/25/15 1054 05/26/15 0239 05/27/15 0226 05/27/15 0958 05/28/15 0508  NA 144 143  --  142 139  --  140  K 4.4 3.4*  --  3.7 3.5  --  4.4  CL 112* 105  --  103 103  --  106  CO2 20* 25  --  25 24  --  19*  GLUCOSE 119* 119*  --  119* 105*  --  80  BUN 18 26*  --  43* 42*  --  34*  CREATININE  1.85* 2.55*  --  2.71* 2.26*  --  1.86*  CALCIUM 9.4 9.3  --  9.5 9.3  --  9.1  MG  --   --  1.7  --   --  1.8  --   ALKPHOS 53  --   --   --   --   --   --   AST 32  --   --   --   --   --   --   ALT 32  --   --   --   --   --   --   ALBUMIN 4.0  --   --   --   --   --   --     No results found.   Results/Tests Pending at Time of Discharge: none  Discharge Medications:    Medication List    TAKE these medications        Alirocumab 75 MG/ML Sopn  Commonly known as:  PRALUENT  Inject 1 pen into the skin every 14 (fourteen) days.     allopurinol 100 MG tablet  Commonly known as:  ZYLOPRIM  Take 100 mg by mouth 2 (two) times daily.     amLODipine 10 MG tablet  Commonly known as:  NORVASC  Take 10 mg by mouth daily.     aspirin 81 MG tablet  Take 81 mg by mouth daily.     calcitRIOL 0.25 MCG capsule  Commonly known as:  ROCALTROL  Take 0.25 mcg by mouth every other day. Take on Monday, Wednesday and Friday     carvedilol 25 MG tablet  Commonly known as:  COREG  TAKE 2 TABLETS (50 MG TOTAL) BY MOUTH 2 (TWO) TIMES A DAY WITH MEALS     cholecalciferol 1000 units tablet  Commonly known as:  VITAMIN D  Take 1,000 Units by mouth daily.     ezetimibe 10 MG tablet  Commonly known as:  ZETIA  Take 10 mg by mouth daily.     furosemide 40 MG tablet  Commonly known as:  LASIX  Take 40 mg by mouth daily.     hydrALAZINE 50 MG tablet  Commonly known as:  APRESOLINE  TAKE 1 TABLET 3 TIMES A DAY     isosorbide dinitrate 20 MG tablet  Commonly known as:  ISORDIL  TAKE 1 TABLET 3 TIMES A DAY     nitroGLYCERIN 0.4 MG SL tablet  Commonly known as:  NITROSTAT  Place 0.4 mg under the tongue every 5 (five) minutes as needed for chest pain. Reported on 04/12/2015     omega-3 acid ethyl esters 1 g capsule  Commonly known as:  LOVAZA  Take 1 g by mouth daily.     ondansetron 4 MG tablet  Commonly known as:  ZOFRAN  Take 4 mg by mouth every 8 (eight) hours as needed for  nausea or vomiting. Reported on 05/24/2015     PANTOPRAZOLE SODIUM PO  Take 40 mg by mouth as directed.     rosuvastatin 40 MG tablet  Commonly known as:  CRESTOR  Take 40 mg by mouth daily.     sacubitril-valsartan 97-103 MG  Commonly known as:  ENTRESTO  Take 1 tablet by mouth 2 (two) times daily.     spironolactone 25 MG tablet  Commonly known as:  ALDACTONE  Take 25 mg by mouth daily.     ticagrelor 90 MG Tabs tablet  Commonly known as:  BRILINTA  Take 1 tablet (90 mg total) by mouth 2 (two) times daily.        Discharge Instructions: Please refer to Patient Instructions section of EMR for full details.  Patient was counseled important signs and symptoms that should prompt return to medical care, changes in medications, dietary instructions, activity restrictions, and follow up appointments.   Follow-Up Appointments:     Follow-up Information    Follow up with [facility] On 06/03/2015.   Why:  Transitional Care Clinic appointment on 06/03/15 at 11:15 am with Dr. Jarold Song.   Contact information:   [address] [place] [phone]      Follow up with Glori Bickers, MD On 06/08/2015.   Specialty:  Cardiology   Why:  at 0920 for post hospital follow up. Please bring all of your medications to your visit. The code for parking is 0002.   Contact information:   [address] [place] [address] [place], MD 05/28/2015, 2:31 PM PGY-1, [place]

## 2015-05-28 NOTE — Progress Notes (Signed)
Nurse reviewed discharge instructions with the patient, IV and telemetry discontinued and CCMD made aware.  All questions answered and no further follow up at this time.

## 2015-05-28 NOTE — Progress Notes (Signed)
Advanced Heart Failure Rounding Note  PCP: None Primary Cardiologist:  Dr. Haroldine Laws / Dr. Caryl Comes.  Subjective:    R/LHC yesterday as below. Feels great this am.  No SOB or CP.  No lightheadedness or dizziness   Weight down 9 lbs overall.   Creatinine 1.85 -> 2.55 -> 2.71 -> 2.2 -> 1.86  R/LHC 05/27/15  Dist RCA-1 lesion, 100% stenosed. The lesion was previously treated with a stent (unknown type).  Dist RCA-2 lesion, 100% stenosed. The lesion was previously treated with a stent (unknown type).  Prox RCA lesion, 60% stenosed. The lesion was previously treated with a stent (unknown type) and angioplasty.  Mid RCA lesion, 40% stenosed. The lesion was previously treated with a stent (unknown type). Findings: RA = 1 RV = 22/1/5 PA = 27/13 (19) PCW = 6 Ao = 120/78 (96) LV = 124/4/13 Fick cardiac output/index = 3.3/1.9 PVR = 4.0 WU SVR = 2335  FA sat = 94% PA sat = 60%. 60%  Objective:   Weight Range: 139 lb 1.6 oz (63.095 kg) Body mass index is 21.78 kg/(m^2).   Vital Signs:   Temp:  [97.5 F (36.4 C)-98.2 F (36.8 C)] 98.2 F (36.8 C) (05/12 0650) Pulse Rate:  [74-93] 85 (05/12 0650) Resp:  [0-23] 19 (05/11 2139) BP: (112-149)/(74-115) 131/94 mmHg (05/12 0650) SpO2:  [90 %-100 %] 100 % (05/12 0650) Weight:  [139 lb 1.6 oz (63.095 kg)] 139 lb 1.6 oz (63.095 kg) (05/12 0650) Last BM Date: 05/27/15  Weight change: Filed Weights   05/26/15 0614 05/27/15 0503 05/28/15 0650  Weight: 137 lb 11.2 oz (62.46 kg) 138 lb 12.8 oz (62.959 kg) 139 lb 1.6 oz (63.095 kg)    Intake/Output:   Intake/Output Summary (Last 24 hours) at 05/28/15 0837 Last data filed at 05/28/15 0649  Gross per 24 hour  Intake    480 ml  Output   1050 ml  Net   -570 ml     Physical Exam: General:  Well appearing. No resp difficulty HEENT: normal Neck: supple. JVP 6-7 . Carotids 2+ bilat; no bruits. No thyromegaly or lymphadenopathy noted Cor: PMI nondisplaced. RRR, No M/G/R Lungs:  Clear, normal Abdomen: soft, NT, ND, no HSM. No bruits or masses. +BS  Extremities: no cyanosis, clubbing, rash, edema Neuro: alert & orientedx3, cranial nerves grossly intact. moves all 4 extremities w/o difficulty. Pleasant affect   Telemetry: Reviewed, NSR  Labs: CBC  Recent Labs  05/27/15 0226 05/28/15 0508  WBC 5.8 4.8  HGB 15.2 14.2  HCT 44.8 41.9  MCV 101.8* 101.7*  PLT 187 0000000   Basic Metabolic Panel  Recent Labs  05/25/15 1054  05/27/15 0226 05/27/15 0958 05/28/15 0508  NA  --   < > 139  --  140  K  --   < > 3.5  --  4.4  CL  --   < > 103  --  106  CO2  --   < > 24  --  19*  GLUCOSE  --   < > 105*  --  80  BUN  --   < > 42*  --  34*  CREATININE  --   < > 2.26*  --  1.86*  CALCIUM  --   < > 9.3  --  9.1  MG 1.7  --   --  1.8  --   < > = values in this interval not displayed. Liver Function Tests No results for input(s): AST, ALT,  ALKPHOS, BILITOT, PROT, ALBUMIN in the last 72 hours. No results for input(s): LIPASE, AMYLASE in the last 72 hours. Cardiac Enzymes  Recent Labs  05/25/15 1054  TROPONINI 0.07*    BNP: BNP (last 3 results)  Recent Labs  05/24/15 1159  BNP >4500.0*    ProBNP (last 3 results) No results for input(s): PROBNP in the last 8760 hours.   D-Dimer No results for input(s): DDIMER in the last 72 hours. Hemoglobin A1C No results for input(s): HGBA1C in the last 72 hours. Fasting Lipid Panel No results for input(s): CHOL, HDL, LDLCALC, TRIG, CHOLHDL, LDLDIRECT in the last 72 hours. Thyroid Function Tests No results for input(s): TSH, T4TOTAL, T3FREE, THYROIDAB in the last 72 hours.  Invalid input(s): FREET3  Other results:     Imaging/Studies:  No results found.  Latest Echo  Latest Cath   Medications:     Scheduled Medications: . allopurinol  100 mg Oral BID  . amLODipine  5 mg Oral Daily  . aspirin EC  81 mg Oral Daily  . calcitRIOL  0.25 mcg Oral Q M,W,F  . carvedilol  25 mg Oral BID WC  .  enoxaparin (LOVENOX) injection  40 mg Subcutaneous Q24H  . ezetimibe  10 mg Oral Daily  . hydrALAZINE  25 mg Oral TID  . isosorbide dinitrate  20 mg Oral TID  . pantoprazole  40 mg Oral Daily  . rosuvastatin  40 mg Oral q1800  . sodium chloride flush  3 mL Intravenous Q12H  . ticagrelor  90 mg Oral BID    Infusions:    PRN Medications: sodium chloride, acetaminophen **OR** acetaminophen, acetaminophen, hydrALAZINE, nitroGLYCERIN, ondansetron (ZOFRAN) IV, polyethylene glycol, sodium chloride flush   Assessment   1. Acute on chronic systolic CHF 2. Hypertensive crisis with acute HF 3. Chest pain 4. CAD - MI x 5, Stents x 18 5. HLD 6. AKI on CKD  Plan    Diuresed well overall. Creatinine down slightly after cath yesterday and gentle fluid rehydration.'  Should current HF meds, including 40 mg lasix daily.   RCA re-stenosis in such a short time concerning. Will need to follow closely.   He is OK to leave from HF perspective. We will schedule close follow up in the HF clinic.   Cardiac Rehab following.  Length of Stay: 4   Shirley Friar PA-C 05/28/2015, 8:37 AM  Advanced Heart Failure Team Pager [phone] (M-F; 7a - 4p)  Please contact [facility] for night-coverage after hours (4p -7a ) and weekends on [email or web address]  Patient seen and examined with Oda Kilts, PA-C. We discussed all aspects of the encounter. I agree with the assessment and plan as stated above.   Cath results reviewed with him. He is stable. He has 60-70% ISR of RCA stent. Stable for now but suspect will need repeat cath in next few months to reassess. Volume status improved. Creatinine ok after cath. Of to d/c home today. Will follow closely in clinic.   Marcelyn Ruppe,MD 3:30 PM

## 2015-05-28 NOTE — Progress Notes (Signed)
Family Medicine Teaching Service Daily Progress Note Intern Pager: [phone]  Patient name: Marvin Martin Medical record number: [id number] Date of birth: 1965-12-31 Age: 50 y.o. Gender: male  Primary Care Provider: No PCP Per Patient Consultants: cardiology/heart failure team Code Status: Full (per discussion on admission)  Pt Overview and Major Events to Date:  5/8: Admit to FPTS 5/9: Diuresed, Lasix and Entresto stopped due to rising Cr 5/10: Awaiting cath  5/11: Cath performed  Assessment and Plan: Marvin Martin is a 50 y.o. male presenting with shortness of breath and chest pain . PMH is significant for CAD (s/p MI 5 and stents 18 with recurrent in-stent thrombosis), ICM, HFrEF, HLD, and CKD III.   CHF Exacerbation: Resolved. On RA at 100% O2. Cr improved, now 1.86. Lasix 80 mg BID and Sacubitril-valsartan (entresto) 97-103 mg BID stopped on 5/9 due to increase in Cr. 1 L of fluid out out over last 24 hours.  - Vital signs per floor protocol - Continuous cardiac monitoring - Cardiology/heart failure team consulted, appreciate their assistance - AM BMPs  - Strict I/Os, daily weights - Continue home heart failure meds: Carvedilol 25 mg BID, Isordil 20 mg TID, Spironolactone 25 mg daily - Would like to resume Entresto and Lasix but will wait for cardiology to see patient first before resuming  ACS r/o: Troponin 0.04>0.04>0.05>0.07. EKG without any acute changes this AM. Unlikely ACS event at this time although patient is at very high risk.  - s/p IV Heparin per cardiology/heart failure team - Continue home ASA 81 mg - AM EKGs as above  CAD/HTN:s/p MI 5 and stents 18 with recurrent in-stent thrombosis. Pt with a significant h/o CAD dating back to his mid-40's with multiple PCI's. s/p recent Inferior STEMI 3/17 due to ISR. BP WNL.  - Continue ASA, statin, ticagrelor (lifelong) and b-blocker - Continuous cardiac monitoring as above - Monitor BP - Cardiology/heart failure  team recs as above  HLD: 04/01/15: LDL 115 and HDL 35. Goal LDL <70 -Continue home Crestor 40 and Zetia 10   CKD [id number] 1.85 on admission. Cr now 1.86.Baseline Cr appears to be around 1.7-2.0.  - AM BMP - Avoid nephrotoxic agents  - Continue to hold IV Lasix 80 mg BID and Sacubitril-valsartan (entresto) 97-103 mg BID, would like to resume after cardiology sees patient  FEN/GI: Heart healthy diet, SLIV Prophylaxis: Home ticagrelor (lifelong), IV Heparin per cardio  Disposition:Continue to monitor inpatient until cleared from cardiology  Subjective:  Vital signs stable overnight. Patient has no complaints this morning. Denies any shortness of breath, is now on room air. Denies chest pain, headaches, fever, dizziness, nausea. Patient very pleasant and looking forward to seeing cardiology.   Objective: Temp:  [97.5 F (36.4 C)-98.2 F (36.8 C)] 98.2 F (36.8 C) (05/12 0650) Pulse Rate:  [74-93] 85 (05/12 0650) Resp:  [0-23] 19 (05/11 2139) BP: (112-149)/(74-115) 131/94 mmHg (05/12 0650) SpO2:  [90 %-100 %] 100 % (05/12 0650) Weight:  [139 lb 1.6 oz (63.095 kg)] 139 lb 1.6 oz (63.095 kg) (05/12 0650) Physical Exam: General: Pleasant well developed male, laying in bed, in NAD Cardiovascular: RRR, no murmur. No edema. 2+ dorsalis pedis pulse bilaterally Respiratory: No respiratory distress, CTAB, no crackles appreciated. On RA Abdomen: soft, non distended, non tender, normal bowel sounds Extremities: no edema  Laboratory:  Recent Labs Lab 05/26/15 0239 05/27/15 0226 05/28/15 0508  WBC 5.9 5.8 4.8  HGB 14.8 15.2 14.2  HCT 45.1 44.8 41.9  PLT 180 187 195  Recent Labs Lab 05/24/15 1158  05/26/15 0239 05/27/15 0226 05/28/15 0508  NA 144  < > 142 139 140  K 4.4  < > 3.7 3.5 4.4  CL 112*  < > 103 103 106  CO2 20*  < > 25 24 19*  BUN 18  < > 43* 42* 34*  CREATININE 1.85*  < > 2.71* 2.26* 1.86*  CALCIUM 9.4  < > 9.5 9.3 9.1  PROT 7.4  --   --   --   --   BILITOT  1.2  --   --   --   --   ALKPHOS 53  --   --   --   --   ALT 32  --   --   --   --   AST 32  --   --   --   --   GLUCOSE 119*  < > 119* 105* 80  < > = values in this interval not displayed.  Troponin: 0.04>0.04>0.05>0.07  Imaging/Diagnostic Tests: No results found.   Carlyle Dolly, MD 05/28/2015, 7:02 AM PGY-1, [place] Intern pager: [phone], text pages welcome

## 2015-05-28 NOTE — Progress Notes (Signed)
CARDIAC REHAB PHASE I   PRE:  Rate/Rhythm: 83 SR    BP: sitting 122/85    SaO2:   MODE:  Ambulation: 1520 ft   POST:  Rate/Rhythm: 93 SR    BP: sitting 118/78     SaO2:   Tolerated well, feels good. Ed completed/reviewed emphasizing HF management. Gave HF booklet. Pt eager to begin exercising and he will f/u with [facility]II.  [id number]   [place], ACSM 05/28/2015 10:35 AM

## 2015-05-31 ENCOUNTER — Telehealth: Payer: Self-pay

## 2015-05-31 NOTE — Telephone Encounter (Signed)
Transitional Care Clinic Post-discharge Follow-Up Phone Call:  Attempt #1  Date of Discharge:  05/28/2015 Principal Discharge Diagnosis(es):  Acute on chronic systolic CHF, recurrent angina - s/p coronary stent placement, AKI, CAD, hypertensive heart disease. Post-discharge Communication: Calls placed to # [phone] adn # [phone] and HIPAA compliant voice mail messages were left at each # requesting a call back to # [phone] or [phone].  Call Completed: No

## 2015-06-01 ENCOUNTER — Telehealth: Payer: Self-pay

## 2015-06-01 NOTE — Telephone Encounter (Signed)
Transitional Care Clinic Post-discharge Follow-Up Phone Call:  Date of Discharge:05/28/2015 Principal Discharge Diagnosis(es):  Acute on chronic systolic CHF, recurrent angina s/p coronary stent placement, CAD, AKI, HLD, ischemic cardiomyopathy Post-discharge Communication:  Call placed to the patient   Call Completed: Yes                    With Whom: Patient Interpreter Needed: No     Please check all that apply:  X  Patient is knowledgeable of his/her condition(s) and/or treatment. X  Patient is caring for self at home. He said that he lives alone and is independent with ADLs and does not need any assistance at this time.  He said that doesn't have any problem getting food. He did state that his family lives in [place]. ? Patient is receiving assist at home from family and/or caregiver. Family and/or caregiver is knowledgeable of patient's condition(s) and/or treatment. ? Patient is receiving home health services. If so, name of agency.     Medication Reconciliation:  ? Medication list reviewed with patient. X  Patient obtained all discharge medications. If not, why? -  He said that he has all of his medications and does not have a problem affording them as some have no co-pay. He stated that he has  his prescriptions filled at [facility] and then noted that the alirocumab is from [facility]. He then  informed this CM that he has [employer].  This CM offered to review his medication list with him and he said that he just took some medication and was planning on laying down and did not need to review them now. He said that he is taking all of the medications as ordered, including the brilinta and he does not have any questions about the medications at this time. Instructed him to bring all of his medication bottles to his appointment at [facility] on 06/03/15 and he stated that he would. He reported that the newest medication is the alirocumab and he has taken it twice. He confirmed that  he understands that it is to be administered every 14 days and he did not have any difficulty administering it.  Informed him that the [facility] has a pharmacist available if he needs any additional medication instruction.    Activities of Daily Living:   X  Independent  - He said that he has a cane to use if needed.  ? Needs assist (describe; ? home DME used) ? [facility] (describe, ? home DME used)   Community resources in place for patient:  X  None  ? Home Health/Home DME ? Assisted Living ? Support Group          Patient Education: Instructed him about maintaining a weight log. He confirmed that he has a scale and weighs himself every day. He said that the doctor told him to start keeping a log of his weights and he knows to call the doctor if he gains 2 or more  lbs overnight or 3-4 lbs in 1-2 days. [name] He reported that his weight fluctuates between 134-137 lbs. Overall he stated that he is " doing okay."  He stated that the doctors are " watching a stent" and he is wondering why they did not " fix" it when he was in the hospital. This CM encouraged him to contact his cardiologist to address this question.         Questions/Concerns discussed: Confirmed his appointment at the [facility] on 06/03/15 @  1115. He said that his cousin would be able to transport him to the clinic. Reminded him that the [facility] can provide transportation to the clinic if he cousin is not able to help him.  He said that he still needs a provider signature for his SCAT application and he will bring the application to his appointment this week.  He did not report any further questions/concerns at this time.

## 2015-06-02 ENCOUNTER — Telehealth: Payer: Self-pay

## 2015-06-02 NOTE — Telephone Encounter (Signed)
Call placed to the patient and confirmed his appointment for tomorrow, 06/03/15 @ 1115. He said that his cousin is sick and will not be able to bring him to the clinic. Cab transportation to be arranged by [facility]. The patient noted that he would bring all of his medications to his appointment tomorrow.  No other questions/concerns reported at this time.

## 2015-06-02 NOTE — Telephone Encounter (Signed)
Marvin Martin, [facility] notified of the patient's need for transportation to his clinic appointment tomorrow.

## 2015-06-03 ENCOUNTER — Encounter: Payer: Self-pay | Admitting: Family Medicine

## 2015-06-03 ENCOUNTER — Ambulatory Visit: Payer: Medicare Other | Attending: Family Medicine | Admitting: Family Medicine

## 2015-06-03 VITALS — BP 142/92 | HR 89 | Temp 98.3°F | Resp 16 | Ht 67.0 in | Wt 146.0 lb

## 2015-06-03 DIAGNOSIS — Z955 Presence of coronary angioplasty implant and graft: Secondary | ICD-10-CM | POA: Insufficient documentation

## 2015-06-03 DIAGNOSIS — I209 Angina pectoris, unspecified: Secondary | ICD-10-CM

## 2015-06-03 DIAGNOSIS — N183 Chronic kidney disease, stage 3 unspecified: Secondary | ICD-10-CM

## 2015-06-03 DIAGNOSIS — E785 Hyperlipidemia, unspecified: Secondary | ICD-10-CM

## 2015-06-03 DIAGNOSIS — I10 Essential (primary) hypertension: Secondary | ICD-10-CM

## 2015-06-03 DIAGNOSIS — I5042 Chronic combined systolic (congestive) and diastolic (congestive) heart failure: Secondary | ICD-10-CM | POA: Diagnosis not present

## 2015-06-03 DIAGNOSIS — Z79899 Other long term (current) drug therapy: Secondary | ICD-10-CM | POA: Diagnosis not present

## 2015-06-03 DIAGNOSIS — I2510 Atherosclerotic heart disease of native coronary artery without angina pectoris: Secondary | ICD-10-CM

## 2015-06-03 DIAGNOSIS — Z951 Presence of aortocoronary bypass graft: Secondary | ICD-10-CM

## 2015-06-03 DIAGNOSIS — I129 Hypertensive chronic kidney disease with stage 1 through stage 4 chronic kidney disease, or unspecified chronic kidney disease: Secondary | ICD-10-CM | POA: Insufficient documentation

## 2015-06-03 DIAGNOSIS — Z7982 Long term (current) use of aspirin: Secondary | ICD-10-CM | POA: Diagnosis not present

## 2015-06-03 MED ORDER — CARVEDILOL 25 MG PO TABS: 25.0000 mg | ORAL_TABLET | Freq: Two times a day (BID) | ORAL | Status: DC

## 2015-06-03 MED ORDER — NITROGLYCERIN 0.4 MG SL SUBL: 0.4000 mg | SUBLINGUAL_TABLET | SUBLINGUAL | Status: DC | PRN

## 2015-06-03 NOTE — Progress Notes (Signed)
Patient's here for f/up CAD/CHF exacerbation. Patient here for est. care with PCP.  Patient reports feeling ok today.  Patient denies any pain today.  Patient requesting Carvedilol, and Nitro

## 2015-06-03 NOTE — Progress Notes (Signed)
While patient at [facility] appointment, SCAT Professional Verification form completed. Form signed by Dr. Jarold Song. SCAT application faxed to the SCAT Eligibility Staff, Attn: Sheria High at [phone]. Patient aware and appreciative. No additional needs identified.

## 2015-06-03 NOTE — Progress Notes (Signed)
[facility]  Date of Telephone Encounter: 05/31/15  Admit date: 05/24/15 Discharge date: 05/28/15  Subjective:  Patient ID: Marvin Martin, male    DOB: [birth year]  Age: 50 y.o. MRN: [id number]  CC: Follow-up; Coronary Artery Disease; and Congestive Heart Failure   HPI Marvin Martin presents to establish care at the transitional care clinic. Medical history significant for CAD (s/p MI 5 and stents 18 with recurrent in-stent thrombosis), ICM, CHF (EF 20-25%) , HLD, and stage III chronic kidney disease.  He had presented with shortness of breath and chest pain. Troponins were mildly elevated and peaked at 0.07, EKG unremarkable; he received diuresis which was later held due to worsening renal function. IV heparin was administered due to high risk for stent occlusion but then was later discontinued. He underwent a right heart cath which revealed RCA re- stenosis :Distal RCA 100% stenosis, proximal RCA 60% stenosed, mid RCA 40% stenosed (lesions were previously treated with stents). He was subsequently scheduled for an outpatient follow-up with cardiology.  Today he reports doing well; his exercise tolerance is about 1 block. He denies pedal edema or chest pain and has been compliant with all his medications. He has a form for transportation assistance with him which he will like completed.  Outpatient Prescriptions Prior to Visit  Medication Sig Dispense Refill  . Alirocumab (PRALUENT) 75 MG/ML SOPN Inject 1 pen into the skin every 14 (fourteen) days. 2 pen 11  . allopurinol (ZYLOPRIM) 100 MG tablet Take 100 mg by mouth 2 (two) times daily.     [name] amLODipine (NORVASC) 10 MG tablet Take 10 mg by mouth daily.    [name] aspirin 81 MG tablet Take 81 mg by mouth daily.    . calcitRIOL (ROCALTROL) 0.25 MCG capsule Take 0.25 mcg by mouth every other day. Take on Monday, Wednesday and Friday    . cholecalciferol (VITAMIN D) 1000 UNITS tablet Take 1,000 Units by mouth daily.    [name]  ezetimibe (ZETIA) 10 MG tablet Take 10 mg by mouth daily.    . furosemide (LASIX) 40 MG tablet Take 40 mg by mouth daily.     . hydrALAZINE (APRESOLINE) 50 MG tablet TAKE 1 TABLET 3 TIMES A DAY 90 tablet 3  . isosorbide dinitrate (ISORDIL) 20 MG tablet TAKE 1 TABLET 3 TIMES A DAY 90 tablet 3  . omega-3 acid ethyl esters (LOVAZA) 1 G capsule Take 1 g by mouth daily.     . ondansetron (ZOFRAN) 4 MG tablet Take 4 mg by mouth every 8 (eight) hours as needed for nausea or vomiting. Reported on 05/24/2015    . PANTOPRAZOLE SODIUM PO Take 40 mg by mouth as directed.     . rosuvastatin (CRESTOR) 40 MG tablet Take 40 mg by mouth daily.    . sacubitril-valsartan (ENTRESTO) 97-103 MG Take 1 tablet by mouth 2 (two) times daily. 60 tablet 6  . spironolactone (ALDACTONE) 25 MG tablet Take 25 mg by mouth daily.    . ticagrelor (BRILINTA) 90 MG TABS tablet Take 1 tablet (90 mg total) by mouth 2 (two) times daily. 60 tablet 5  . carvedilol (COREG) 25 MG tablet TAKE 2 TABLETS (50 MG TOTAL) BY MOUTH 2 (TWO) TIMES A DAY WITH MEALS 120 tablet 0  . nitroGLYCERIN (NITROSTAT) 0.4 MG SL tablet Place 0.4 mg under the tongue every 5 (five) minutes as needed for chest pain. Reported on 04/12/2015     No facility-administered medications prior to visit.    ROS Review  of Systems  Constitutional: Negative for activity change and appetite change.  HENT: Negative for sinus pressure and sore throat.   Eyes: Negative for visual disturbance.  Respiratory: Negative for cough, chest tightness and shortness of breath.   Cardiovascular: Negative for chest pain and leg swelling.  Gastrointestinal: Negative for abdominal pain, diarrhea, constipation and abdominal distention.  Endocrine: Negative.   Genitourinary: Negative for dysuria.  Musculoskeletal: Negative for myalgias and joint swelling.  Skin: Negative for rash.  Allergic/Immunologic: Negative.   Neurological: Negative for weakness, light-headedness and numbness.    Psychiatric/Behavioral: Negative for suicidal ideas and dysphoric mood.    Objective:  BP 142/92 mmHg  Pulse 89  Temp(Src) 98.3 F (36.8 C) (Oral)  Resp 16  Ht 5\' 7"  (1.702 m)  Wt 146 lb (66.225 kg)  BMI 22.86 kg/m2  SpO2 100%  BP/Weight 06/03/2015 [id number]  Systolic BP [id number] -  Diastolic BP 92 95 -  Wt. (Lbs) 146 139.1 -  BMI 22.86 - 21.78      Physical Exam  Constitutional: He is oriented to person, place, and time. He appears well-developed and well-nourished.  Cardiovascular: Normal rate, normal heart sounds and intact distal pulses.   No murmur heard. Pulmonary/Chest: Effort normal and breath sounds normal. He has no wheezes. He has no rales. He exhibits no tenderness.  Abdominal: Soft. Bowel sounds are normal. He exhibits no distension and no mass. There is no tenderness.  Musculoskeletal: Normal range of motion.  Sites of cardiac cath at the left wrist and left antecubital fossa healing well  Neurological: He is alert and oriented to person, place, and time.     Assessment & Plan:   1. Chronic combined systolic and diastolic congestive heart failure (HCC) EF of 20-25% Euvolemic at this time Daily weight checks, limited daily fluids to less than 2 L per day, cardiac diet - nitroGLYCERIN (NITROSTAT) 0.4 MG SL tablet; Place 1 tablet (0.4 mg total) under the tongue every 5 (five) minutes as needed for chest pain. Reported on 04/12/2015  Dispense: 60 tablet; Refill: 1 - carvedilol (COREG) 25 MG tablet; Take 1 tablet (25 mg total) by mouth 2 (two) times daily with a meal.  Dispense: 180 tablet; Refill: 1  2. Coronary artery disease Status post multiple stents. He has a family history of cardiac disease Continue Brilinta, aspirin and statin Aggressive risk factor modification Close follow-up with his cardiologist  3. CKD (chronic kidney disease) stage 3, GFR 30-59 ml/min Stable Avoid nephrotoxic agents Labs at next visit and we'll determine if  nephrology referral is indicated  4. Hyperlipidemia Continue statin  Low-cholesterol diet  5. Benign essential HTN Controlled   Meds ordered this encounter  Medications  . nitroGLYCERIN (NITROSTAT) 0.4 MG SL tablet    Sig: Place 1 tablet (0.4 mg total) under the tongue every 5 (five) minutes as needed for chest pain. Reported on 04/12/2015    Dispense:  60 tablet    Refill:  1  . carvedilol (COREG) 25 MG tablet    Sig: Take 1 tablet (25 mg total) by mouth 2 (two) times daily with a meal.    Dispense:  180 tablet    Refill:  1    Follow-up: Return in about 2 weeks (around 06/17/2015) for TCC- follow up of CAD.   Arnoldo Morale MD

## 2015-06-08 ENCOUNTER — Ambulatory Visit (HOSPITAL_COMMUNITY)
Admit: 2015-06-08 | Discharge: 2015-06-08 | Disposition: A | Discharge status: Home / Self Care | Payer: Medicare Other | Attending: Adult Health | Admitting: Adult Health

## 2015-06-08 ENCOUNTER — Encounter (HOSPITAL_COMMUNITY): Payer: Self-pay

## 2015-06-08 VITALS — BP 156/104 | HR 85 | Wt 146.2 lb

## 2015-06-08 DIAGNOSIS — I5023 Acute on chronic systolic (congestive) heart failure: Secondary | ICD-10-CM

## 2015-06-08 DIAGNOSIS — I2510 Atherosclerotic heart disease of native coronary artery without angina pectoris: Secondary | ICD-10-CM | POA: Diagnosis not present

## 2015-06-08 DIAGNOSIS — Z79899 Other long term (current) drug therapy: Secondary | ICD-10-CM | POA: Insufficient documentation

## 2015-06-08 DIAGNOSIS — Z7982 Long term (current) use of aspirin: Secondary | ICD-10-CM | POA: Diagnosis not present

## 2015-06-08 DIAGNOSIS — I255 Ischemic cardiomyopathy: Secondary | ICD-10-CM | POA: Diagnosis not present

## 2015-06-08 DIAGNOSIS — I119 Hypertensive heart disease without heart failure: Secondary | ICD-10-CM | POA: Insufficient documentation

## 2015-06-08 DIAGNOSIS — T82858D Stenosis of vascular prosthetic devices, implants and grafts, subsequent encounter: Secondary | ICD-10-CM | POA: Diagnosis not present

## 2015-06-08 DIAGNOSIS — N183 Chronic kidney disease, stage 3 unspecified: Secondary | ICD-10-CM

## 2015-06-08 DIAGNOSIS — I252 Old myocardial infarction: Secondary | ICD-10-CM | POA: Diagnosis not present

## 2015-06-08 DIAGNOSIS — I129 Hypertensive chronic kidney disease with stage 1 through stage 4 chronic kidney disease, or unspecified chronic kidney disease: Secondary | ICD-10-CM | POA: Insufficient documentation

## 2015-06-08 DIAGNOSIS — I5022 Chronic systolic (congestive) heart failure: Secondary | ICD-10-CM | POA: Diagnosis not present

## 2015-06-08 DIAGNOSIS — E785 Hyperlipidemia, unspecified: Secondary | ICD-10-CM | POA: Insufficient documentation

## 2015-06-08 DIAGNOSIS — R0602 Shortness of breath: Secondary | ICD-10-CM | POA: Insufficient documentation

## 2015-06-08 DIAGNOSIS — I209 Angina pectoris, unspecified: Secondary | ICD-10-CM

## 2015-06-08 DIAGNOSIS — I110 Hypertensive heart disease with heart failure: Secondary | ICD-10-CM

## 2015-06-08 LAB — BASIC METABOLIC PANEL
Anion gap: 7 (ref 5–15)
BUN: 21 mg/dL — ABNORMAL HIGH (ref 6–20)
CALCIUM: 9.2 mg/dL (ref 8.9–10.3)
CO2: 21 mmol/L — AB (ref 22–32)
CREATININE: 1.68 mg/dL — AB (ref 0.61–1.24)
Chloride: 111 mmol/L (ref 101–111)
GFR, EST AFRICAN AMERICAN: 53 mL/min — AB (ref >60)
GFR, EST NON AFRICAN AMERICAN: 46 mL/min — AB (ref >60)
GLUCOSE: 133 mg/dL — AB (ref 65–99)
Potassium: 3.8 mmol/L (ref 3.5–5.1)
Sodium: 139 mmol/L (ref 135–145)

## 2015-06-08 NOTE — Patient Instructions (Signed)
Be sure to take your medications as soon as you get home today   Labs today  Your physician has recommended that you have a cardiopulmonary stress test (CPX). CPX testing is a non-invasive measurement of heart and lung function. It replaces a traditional treadmill stress test. This type of test provides a tremendous amount of information that relates not only to your present condition but also for future outcomes. This test combines measurements of you ventilation, respiratory gas exchange in the lungs, electrocardiogram (EKG), blood pressure and physical response before, during, and following an exercise protocol.  Your physician recommends that you schedule a follow-up appointment in: 4 weeks In the [place] following things EVERYDAY: 1) Weigh yourself in the morning before breakfast. Write it down and keep it in a log. 2) Take your medicines as prescribed 3) Eat low salt foods-Limit salt (sodium) to 2000 mg per day.  4) Stay as active as you can everyday 5) Limit all fluids for the day to less than 2 liters 6)

## 2015-06-08 NOTE — Progress Notes (Signed)
Patient ID: Marvin Martin, male   DOB: 1965/06/12, 50 y.o.   MRN: [id number]    Referring Physician: Dr Laney Pastor Primary Care: Dr Laney Pastor Primary Cardiologist:  HPI: Marvin Martin is a 50 year old male with history of CAD, ICM, MI X4 (presents with chest burning radiating to L shoulder) with 17 stents, chronic systolic HF with EF [id number] s/p St. Jude ICD, HTN, gout, and hyperlipidemia.  He just relocated to [facility] from [place]. His medical care was provided by [facility]. He has struggled with in-stent restenosis. Most recent heart cath was February 2016 with stent placed. He was referred to HF clinic by Dr Laney Pastor to establish HF care.    Admitted May 8th through May 12th, 2017 with chest pain. Had LHC as noted below no intervention.   Today he returns for HF follow up. SOB after walking 1/2 block. SOB and fatigue walking to the bathroom. Denies PND/Orthopnea. Denies CP. SBP at home 120-130s. Weight at home 144 -146 pounds.Appetite fair. Taking all medications. Lives alone. Has a sister in [place]. Disabled since 2006.   Echo 01/30/15 EF 20-25% RV normal  R/LHC 05/27/15  Dist RCA-1 lesion, 100% stenosed. The lesion was previously treated with a stent (unknown type).  Dist RCA-2 lesion, 100% stenosed. The lesion was previously treated with a stent (unknown type).  Prox RCA lesion, 60% stenosed. The lesion was previously treated with a stent (unknown type) and angioplasty.  Mid RCA lesion, 40% stenosed. The lesion was previously treated with a stent (unknown type). Findings: RA = 1 RV = 22/1/5 PA = 27/13 (19) PCW = 6 Ao = 120/78 (96) LV = 124/4/13 Fick cardiac output/index = 3.3/1.9 PVR = 4.0 WU SVR = 2335  FA sat = 94% PA sat = 60%. 60%  CPX 01/15/15 FVC 2.67 (65%)    FEV1 2.18 (64%)     FEV1/FVC 81 (100%)     MVV 105 (73%) Peak VO2: 20.3 (56.3% predicted peak VO2) VE/VCO2 slope: 26.8 OUES: 1.52 Peak RER: 1.21  Labs: 12/16: K 4.0 Creatinine  1.7 Hgb 13.2 MCV 107 (denies ETOH) 05/28/2015: K 4.4 Creatinine 1.86   SH: Separated from his wife. Has 4 children. 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         Father deceased but he is unsure of the cause. He had hyperlipidemia.         Sister: Heart disease  [facility]  [address] [place] [address] [phone]   Past Medical History  Diagnosis Date  . Chronic systolic CHF (congestive heart failure) ([place])     a. 01/2015 Echo: EF 20-25%, antlat, inflat AK.  [name] Hyperlipidemia   . Coronary artery disease     a. s/p MI x 4;  b. Reported h/o 17 stents with recurrent ISR;  c. 02/2014 Cath ([place]): PCI to unknown vessel;  d. 01/2015 MV: EF 18%, large scar, no ischemia; e. 03/2015 PCI: LM nl, LAD nl, D2 patent stent, RI patent stent, LCX patent stents p/m, RCA 95p ISR (PTCA), 72m, 100d into RPL CTO, RPDA 90 (2.5x16 Synergy DES).  . Hypertensive heart disease   . Ischemic cardiomyopathy     a. s/p SJM ICD 2007 w/ gen change in 2012; b. 12/2014 CPX Test: mild to mod HF limitation w/ pVO2 20.3 and nl slope;  c. 01/2015 Echo: EF 20-25%.  . CKD (chronic kidney disease), stage III     Current Outpatient Prescriptions  Medication Sig Dispense Refill  . Alirocumab (PRALUENT) 75 MG/ML SOPN Inject 1 pen into the skin every 14 (fourteen) days. 2 pen 11  . allopurinol (ZYLOPRIM) 100 MG tablet Take 100 mg by mouth 2 (two) times daily.     [name] amLODipine (NORVASC) 10 MG tablet Take 10 mg by mouth daily.    [name] aspirin 81 MG tablet Take 81 mg by mouth daily.    . calcitRIOL (ROCALTROL) 0.25 MCG capsule Take 0.25 mcg by mouth every other day. Take on Monday, Wednesday and Friday    . carvedilol (COREG) 25 MG tablet Take 1 tablet (25 mg total) by mouth 2 (two) times daily with a meal. 180 tablet 1  . cholecalciferol (VITAMIN D) 1000 UNITS tablet Take 1,000 Units by mouth daily.    [name] ezetimibe (ZETIA) 10 MG tablet Take 10 mg by mouth daily.    . furosemide  (LASIX) 40 MG tablet Take 40 mg by mouth daily.     . hydrALAZINE (APRESOLINE) 50 MG tablet TAKE 1 TABLET 3 TIMES A DAY 90 tablet 3  . isosorbide dinitrate (ISORDIL) 20 MG tablet TAKE 1 TABLET 3 TIMES A DAY 90 tablet 3  . nitroGLYCERIN (NITROSTAT) 0.4 MG SL tablet Place 1 tablet (0.4 mg total) under the tongue every 5 (five) minutes as needed for chest pain. Reported on 04/12/2015 60 tablet 1  . omega-3 acid ethyl esters (LOVAZA) 1 G capsule Take 1 g by mouth daily.     . ondansetron (ZOFRAN) 4 MG tablet Take 4 mg by mouth every 8 (eight) hours as needed for nausea or vomiting. Reported on 05/24/2015    . PANTOPRAZOLE SODIUM PO Take 40 mg by mouth as directed.     . rosuvastatin (CRESTOR) 40 MG tablet Take 40 mg by mouth daily.    . sacubitril-valsartan (ENTRESTO) 97-103 MG Take 1 tablet by mouth 2 (two) times daily. 60 tablet 6  . spironolactone (ALDACTONE) 25 MG tablet Take 25 mg by mouth daily.    . ticagrelor (BRILINTA) 90 MG TABS tablet Take 1 tablet (90 mg total) by mouth 2 (two) times daily. 60 tablet 5   No current facility-administered medications for this encounter.    No Known Allergies    Social History   Social History  . Marital Status: Legally Separated    Spouse Name: N/A  . Number of Children: N/A  . Years of Education: N/A   Occupational History  . Not on file.   Social History Main Topics  . Smoking status: Never Smoker   . Smokeless tobacco: Not on file  . Alcohol Use: No  . Drug Use: No  . Sexual Activity: No   Other Topics Concern  . Not on file   Social History Narrative   Moved to [place] from [place] in 2016.  Lives alone in [place].      Family History  Problem Relation Age of Onset  . Cancer Mother     died when pt was 73.  She had a h/o heart dzs as well.  . Hypertension Mother   . Hyperlipidemia Father     Father died when pt was age 4 - he believes that he had a cardiac hx.  [name] CAD Sister     Danley Danker Vitals:   06/08/15 0918  BP: 156/104  Pulse: 85    Weight: 146 lb 3.2 oz (66.316 kg)  SpO2: 99%    PHYSICAL EXAM: General:  Well appearing. No respiratory difficulty. Ambulated slowly in  the clinic.  HEENT: normal Neck: supple. no JVD. Carotids 2+ bilat; no bruits. No lymphadenopathy or thryomegaly appreciated. Cor: PMI laterally  displaced. Regular rate & rhythm. No rubs, gallops or murmurs. Lungs: clear Abdomen: soft, nontender, nondistended. No hepatosplenomegaly. No bruits or masses. Good bowel sounds. Extremities: no cyanosis, clubbing, rash, edema Neuro: alert & oriented x 3, cranial nerves grossly intact. moves all 4 extremities w/o difficulty. Affect pleasant.    ASSESSMENT & PLAN:  1. Chronic Systolic Heart Failure - due to iCM. EF 20-25% on echo 1/17. RV ok. Has ST Jude ICD  -Repeat CPX test. Functional decline.  NYHA IIIb. Volume status stable. Continue lasix 40 mg daily. Continue spiro 25 mg daily. -Continue carvedilol 25 mg twice a day.  -Continue entresto 97-103 mg twice a day -Continue hydralazine 50 mg tid and isordil 20 mg tid.  -Refer to Cardiac rehab 2. CAD- MI X4 17 stents  Most recent [place] 05/2015 - no intervention continued medical management.   Dist RCA-1 lesion, 100% stenosed. The lesion was previously treated with a stent (unknown type).  Dist RCA-2 lesion, 100% stenosed. The lesion was previously treated with a stent (unknown type).  Prox RCA lesion, 60% stenosed. The lesion was previously treated with a stent (unknown type) and angioplasty.  Mid RCA lesion, 40% stenosed. The lesion was previously treated with a stent (unknown type). - Continue ASA, statin, ticagrelor and b-blocker -Refer to Cardiac Rehab.  3. HTN - Remains elevated but did not have meds this morning.  4. Hyperlipidemia - Continue statin 5. CKD stage III  Check BMET today.   Follow up in 4 weeks.  Amy Clegg NP-C  9:27 AM

## 2015-06-10 ENCOUNTER — Telehealth: Payer: Self-pay

## 2015-06-10 NOTE — Telephone Encounter (Signed)
Call placed to patient to check on status. Patient indicated he was doing well and denied chest pain. Patient indicated he was being compliant with his medications and denied questions about medications as he indicated he was "familiar with them." Patient also indicated he was doing daily weight checks, limiting fluids to less than 2L/day, and being compliant with a cardiac diet. Patient reminded of his appointment on 06/17/15 at 0900 with Dr. Jarold Song. Patient indicated he was aware of his upcoming appointment.   Inquired if patient had heard from [place] regarding his in-person interview as this is next step in determining if eligible to use [facility]. Patient indicated he had not received a call from [facility] regarding interview. Provided patient with number [phone]) to call and schedule in-person interview. Patient appreciative of information. No additional needs/concerns identified.

## 2015-06-16 ENCOUNTER — Ambulatory Visit (HOSPITAL_COMMUNITY): Payer: Medicare Other

## 2015-06-16 ENCOUNTER — Other Ambulatory Visit: Payer: Medicare Other

## 2015-06-16 ENCOUNTER — Ambulatory Visit (HOSPITAL_COMMUNITY)
Admission: RE | Admit: 2015-06-16 | Discharge: 2015-06-16 | Disposition: A | Discharge status: Home / Self Care | Payer: Medicare Other | Source: Ambulatory Visit | Attending: Cardiology | Admitting: Cardiology

## 2015-06-16 ENCOUNTER — Telehealth: Payer: Self-pay

## 2015-06-16 DIAGNOSIS — E785 Hyperlipidemia, unspecified: Secondary | ICD-10-CM | POA: Insufficient documentation

## 2015-06-16 DIAGNOSIS — I5023 Acute on chronic systolic (congestive) heart failure: Secondary | ICD-10-CM | POA: Insufficient documentation

## 2015-06-16 LAB — HEPATIC FUNCTION PANEL
ALBUMIN: 3.7 g/dL (ref 3.5–5.0)
ALT: 24 U/L (ref 17–63)
AST: 26 U/L (ref 15–41)
Alkaline Phosphatase: 50 U/L (ref 38–126)
BILIRUBIN INDIRECT: 0.6 mg/dL (ref 0.3–0.9)
Bilirubin, Direct: 0.1 mg/dL (ref 0.1–0.5)
Total Bilirubin: 0.7 mg/dL (ref 0.3–1.2)
Total Protein: 7.4 g/dL (ref 6.5–8.1)

## 2015-06-16 LAB — LIPID PANEL
CHOL/HDL RATIO: 3.1 ratio
CHOLESTEROL: 149 mg/dL (ref 0–200)
HDL: 48 mg/dL (ref >40)
LDL Cholesterol: 71 mg/dL (ref 0–99)
TRIGLYCERIDES: 152 mg/dL — AB (ref <150)
VLDL: 30 mg/dL (ref 0–40)

## 2015-06-16 NOTE — Telephone Encounter (Signed)
Attempted to contact the patient to confirm his appointment at the [facility] tomorrow, 06/17/15 @ 0900 and to also confirm that he has transportation to the clinic. Calls placed to # [phone] (H) and # [phone] (M) and HIPAA compliant voicemail messages were left at each number requesting a call back to # [phone] or [phone].

## 2015-06-17 ENCOUNTER — Ambulatory Visit: Payer: Medicare Other | Admitting: Family Medicine

## 2015-06-18 ENCOUNTER — Other Ambulatory Visit (HOSPITAL_COMMUNITY): Payer: Self-pay | Admitting: *Deleted

## 2015-06-18 ENCOUNTER — Telehealth: Payer: Self-pay | Admitting: Pharmacist

## 2015-06-18 ENCOUNTER — Telehealth: Payer: Self-pay

## 2015-06-18 DIAGNOSIS — I5023 Acute on chronic systolic (congestive) heart failure: Secondary | ICD-10-CM | POA: Diagnosis not present

## 2015-06-18 NOTE — Telephone Encounter (Signed)
This Case Manager placed call to patient to inquire about status and to discuss rescheduling appointment with Dr. Jarold Song as patient missed appointment on 06/17/15. Patient indicated he was doing well. Denied any questions or concerns regarding his health at this time. Patient indicated he is being compliant with his medications, doing daily weight checks, and limiting fluids to less than 2L/day. Reminded patient to continue keeping a weight log and to bring information to his next appointment with Dr. Jarold Song. Patient verbalized understanding. Patient's appointment rescheduled for 06/24/15 at 1200 with Dr. Jarold Song. Patient indicated he has left voicemail with [facility] Eligibility Office to schedule in-person interview; however, he has not received return call.  Patient indicated he will need transportation to his appointment on 06/24/15 unless he is eligible for [facility] bus before appointment. Clinic Scheduler updated. No additional needs/concerns identified.

## 2015-06-18 NOTE — Telephone Encounter (Signed)
Patient received samples from recalled lot [id number]. He picked up 2 bottles on 04/15/15 from clinic. Advised pt that there was no issues with the Brilinta tablets, but that a bottle was found to contain a blue pill that treats gout. Pt already used both of his bottles and states that he did not see any blue pills. He thanked me for informing him of the recall.

## 2015-06-21 ENCOUNTER — Encounter (HOSPITAL_COMMUNITY): Payer: Self-pay

## 2015-06-21 ENCOUNTER — Inpatient Hospital Stay (HOSPITAL_COMMUNITY)
Admission: EM | Admit: 2015-06-21 | Discharge: 2015-06-30 | DRG: 286 | Disposition: A | Discharge status: Home Health | Payer: Medicare Other | Attending: Internal Medicine | Admitting: Internal Medicine

## 2015-06-21 ENCOUNTER — Emergency Department (HOSPITAL_COMMUNITY): Payer: Medicare Other

## 2015-06-21 DIAGNOSIS — I255 Ischemic cardiomyopathy: Secondary | ICD-10-CM | POA: Diagnosis present

## 2015-06-21 DIAGNOSIS — I5023 Acute on chronic systolic (congestive) heart failure: Secondary | ICD-10-CM | POA: Diagnosis not present

## 2015-06-21 DIAGNOSIS — I169 Hypertensive crisis, unspecified: Secondary | ICD-10-CM | POA: Diagnosis present

## 2015-06-21 DIAGNOSIS — Z7982 Long term (current) use of aspirin: Secondary | ICD-10-CM

## 2015-06-21 DIAGNOSIS — I200 Unstable angina: Secondary | ICD-10-CM | POA: Diagnosis not present

## 2015-06-21 DIAGNOSIS — Z515 Encounter for palliative care: Secondary | ICD-10-CM | POA: Insufficient documentation

## 2015-06-21 DIAGNOSIS — I10 Essential (primary) hypertension: Secondary | ICD-10-CM | POA: Diagnosis present

## 2015-06-21 DIAGNOSIS — I509 Heart failure, unspecified: Secondary | ICD-10-CM

## 2015-06-21 DIAGNOSIS — I130 Hypertensive heart and chronic kidney disease with heart failure and stage 1 through stage 4 chronic kidney disease, or unspecified chronic kidney disease: Principal | ICD-10-CM | POA: Diagnosis present

## 2015-06-21 DIAGNOSIS — I2510 Atherosclerotic heart disease of native coronary artery without angina pectoris: Secondary | ICD-10-CM | POA: Diagnosis present

## 2015-06-21 DIAGNOSIS — N183 Chronic kidney disease, stage 3 unspecified: Secondary | ICD-10-CM | POA: Diagnosis present

## 2015-06-21 DIAGNOSIS — R079 Chest pain, unspecified: Secondary | ICD-10-CM | POA: Diagnosis not present

## 2015-06-21 DIAGNOSIS — I25110 Atherosclerotic heart disease of native coronary artery with unstable angina pectoris: Secondary | ICD-10-CM | POA: Diagnosis not present

## 2015-06-21 DIAGNOSIS — R570 Cardiogenic shock: Secondary | ICD-10-CM | POA: Insufficient documentation

## 2015-06-21 DIAGNOSIS — E1169 Type 2 diabetes mellitus with other specified complication: Secondary | ICD-10-CM | POA: Diagnosis present

## 2015-06-21 DIAGNOSIS — I252 Old myocardial infarction: Secondary | ICD-10-CM

## 2015-06-21 DIAGNOSIS — E785 Hyperlipidemia, unspecified: Secondary | ICD-10-CM | POA: Diagnosis present

## 2015-06-21 DIAGNOSIS — Z955 Presence of coronary angioplasty implant and graft: Secondary | ICD-10-CM

## 2015-06-21 DIAGNOSIS — Z79899 Other long term (current) drug therapy: Secondary | ICD-10-CM

## 2015-06-21 DIAGNOSIS — I119 Hypertensive heart disease without heart failure: Secondary | ICD-10-CM | POA: Diagnosis present

## 2015-06-21 DIAGNOSIS — Z7902 Long term (current) use of antithrombotics/antiplatelets: Secondary | ICD-10-CM

## 2015-06-21 DIAGNOSIS — Z8249 Family history of ischemic heart disease and other diseases of the circulatory system: Secondary | ICD-10-CM

## 2015-06-21 DIAGNOSIS — Z95810 Presence of automatic (implantable) cardiac defibrillator: Secondary | ICD-10-CM

## 2015-06-21 DIAGNOSIS — I129 Hypertensive chronic kidney disease with stage 1 through stage 4 chronic kidney disease, or unspecified chronic kidney disease: Secondary | ICD-10-CM | POA: Diagnosis not present

## 2015-06-21 LAB — CBC WITH DIFFERENTIAL/PLATELET
Basophils Absolute: 0 10*3/uL (ref 0.0–0.1)
Basophils Relative: 0 %
EOS ABS: 0.1 10*3/uL (ref 0.0–0.7)
EOS PCT: 2 %
HCT: 39.1 % (ref 39.0–52.0)
HEMOGLOBIN: 12.9 g/dL — AB (ref 13.0–17.0)
LYMPHS PCT: 17 %
Lymphs Abs: 1.1 10*3/uL (ref 0.7–4.0)
MCH: 33.3 pg (ref 26.0–34.0)
MCHC: 33 g/dL (ref 30.0–36.0)
MCV: 101 fL — ABNORMAL HIGH (ref 78.0–100.0)
MONO ABS: 0.3 10*3/uL (ref 0.1–1.0)
Monocytes Relative: 4 %
NEUTROS ABS: 5.2 10*3/uL (ref 1.7–7.7)
Neutrophils Relative %: 77 %
Platelets: 149 10*3/uL — ABNORMAL LOW (ref 150–400)
RBC: 3.87 MIL/uL — ABNORMAL LOW (ref 4.22–5.81)
RDW: 13.2 % (ref 11.5–15.5)
WBC: 6.8 10*3/uL (ref 4.0–10.5)

## 2015-06-21 MED ORDER — HEPARIN (PORCINE) IN NACL 100-0.45 UNIT/ML-% IJ SOLN
1050.0000 [IU]/h | INTRAMUSCULAR | Status: DC
  Administered 2015-06-22: 800 [IU]/h via INTRAVENOUS
  Administered 2015-06-23: 1050 [IU]/h via INTRAVENOUS
  Filled 2015-06-21 (×2): qty 250

## 2015-06-21 MED ORDER — HEPARIN BOLUS VIA INFUSION
3000.0000 [IU] | Freq: Once | INTRAVENOUS | Status: AC
  Administered 2015-06-22: 3000 [IU] via INTRAVENOUS
  Filled 2015-06-21: qty 3000

## 2015-06-21 MED ORDER — NITROGLYCERIN IN D5W 200-5 MCG/ML-% IV SOLN
0.0000 ug/min | Freq: Once | INTRAVENOUS | Status: AC
  Administered 2015-06-21: 5 ug/min via INTRAVENOUS
  Filled 2015-06-21: qty 250

## 2015-06-21 NOTE — Progress Notes (Signed)
ANTICOAGULATION CONSULT NOTE - Initial Consult  Pharmacy Consult for heparin Indication: chest pain/ACS  No Known Allergies  Patient Measurements:   Vital Signs: Temp: 99.1 F (37.3 C) (06/05 2257) Temp Source: Oral (06/05 2257) BP: 181/119 mmHg (06/05 2257) Pulse Rate: 101 (06/05 2257)  Labs: No results for input(s): HGB, HCT, PLT, APTT, LABPROT, INR, HEPARINUNFRC, HEPRLOWMOCWT, CREATININE, CKTOTAL, CKMB, TROPONINI in the last 72 hours.  Estimated Creatinine Clearance: 49.2 mL/min (by C-G formula based on Cr of 1.68).   Medical History: Past Medical History  Diagnosis Date  . Chronic systolic CHF (congestive heart failure) ([place])     a. 01/2015 Echo: EF 20-25%, antlat, inflat AK.  [name] Hyperlipidemia   . Coronary artery disease     a. s/p MI x 4;  b. Reported h/o 17 stents with recurrent ISR;  c. 02/2014 Cath ([place]): PCI to unknown vessel;  d. 01/2015 MV: EF 18%, large scar, no ischemia; e. 03/2015 PCI: LM nl, LAD nl, D2 patent stent, RI patent stent, LCX patent stents p/m, RCA 95p ISR (PTCA), 38m, 100d into RPL CTO, RPDA 90 (2.5x16 Synergy DES).  . Hypertensive heart disease   . Ischemic cardiomyopathy     a. s/p SJM ICD 2007 w/ gen change in 2012; b. 12/2014 CPX Test: mild to mod HF limitation w/ pVO2 20.3 and nl slope;  c. 01/2015 Echo: EF 20-25%.  . CKD (chronic kidney disease), stage III     Assessment: 50 yo M with CP.  Pharmacy consulted to dose heparin for CP/ACS.  Wt 66.3 kg.  No lab results yet.  Goal of Therapy:  Heparin level 0.3-0.7 units/ml Monitor platelets by anticoagulation protocol: Yes   Plan:  Give 3000 units bolus x 1 Start heparin infusion at 800 units/hr Check anti-Xa level in 6 hours and daily while on heparin Continue to monitor H&H and platelets  Eudelia Bunch, Pharm.D. BP:[id number] 06/21/2015 11:34 PM

## 2015-06-21 NOTE — ED Provider Notes (Addendum)
CSN: [id number]     Arrival date & time 06/21/15  2255 History  By signing my name below, I, Arianna Nassar, attest that this documentation has been prepared under the direction and in the presence of Orpah Greek, MD. Electronically Signed: Julien Nordmann, ED Scribe. 06/21/2015. 11:40 PM.     Chief Complaint  Patient presents with  . Chest Pain      The history is provided by the patient and the EMS personnel. No language interpreter was used.   HPI Comments: Marvin Martin is a 50 y.o. male brought in by ambulance with a PMHx of CHF, HLD, CAD, hypertensive heart disease, ischemic cardiomyopathy, and stage III CKD, who presents to the Emergency Department complaining of sudden onset, gradual worsening, moderate, sharp, central chest pain that radiates to his left arm onset one hour ago. He reports associated shortness of breath, diaphoresis and nausea. He states his pain was at a 8/10 before taking his nitro with it currently being rated a 5/10. He reports having a hx of cardiac catheterization done on 03/21/15 and 18 stents placed and the last intervention was last month where he was seen in the ED for similar symptoms. Pt has taken nitroglycerin to alleviate his pain with minimal relief. Per EMS, pt was given 324 mg of ASA and 3 nitro PTA with relief. Pt notes his symptoms are very similar to prior MI's he has had in the past. He is currently on Brilintta.  He denies leg swelling and vomiting.   Cardiologist: Dr. Haroldine Laws Past Medical History  Diagnosis Date  . Chronic systolic CHF (congestive heart failure) ([place])     a. 01/2015 Echo: EF 20-25%, antlat, inflat AK.  [name] Hyperlipidemia   . Coronary artery disease     a. s/p MI x 4;  b. Reported h/o 17 stents with recurrent ISR;  c. 02/2014 Cath ([place]): PCI to unknown vessel;  d. 01/2015 MV: EF 18%, large scar, no ischemia; e. 03/2015 PCI: LM nl, LAD nl, D2 patent stent, RI patent stent, LCX patent stents p/m, RCA 95p ISR (PTCA), 78m, 100d into  RPL CTO, RPDA 90 (2.5x16 Synergy DES).  . Hypertensive heart disease   . Ischemic cardiomyopathy     a. s/p SJM ICD 2007 w/ gen change in 2012; b. 12/2014 CPX Test: mild to mod HF limitation w/ pVO2 20.3 and nl slope;  c. 01/2015 Echo: EF 20-25%.  . CKD (chronic kidney disease), stage III    Past Surgical History  Procedure Laterality Date  . [place] placements Bilateral   . Pacemaker generator change Bilateral   . Insert / replace / remove pacemaker      St jude 2006  Generator Change 2012   . Cardiac catheterization      Most recent 02/2014   . Cardiac catheterization N/A 03/31/2015    Procedure: Left Heart Cath and Coronary Angiography;  Surgeon: Troy Sine, MD;  Location: [place] [facility];  Service: Cardiovascular;  Laterality: N/A;  . Cardiac catheterization N/A 03/31/2015    Procedure: Coronary Stent Intervention;  Surgeon: Troy Sine, MD;  Location: [place] [facility];  Service: Cardiovascular;  Laterality: N/A;  . Cardiac catheterization N/A 05/27/2015    Procedure: Right/Left Heart Cath and Coronary Angiography;  Surgeon: Jolaine Artist, MD;  Location: [place] [facility];  Service: Cardiovascular;  Laterality: N/A;   Family History  Problem Relation Age of Onset  . Cancer Mother     died when pt was  26.  She had a h/o heart dzs as well.  . Hypertension Mother   . Hyperlipidemia Father     Father died when pt was age 54 - he believes that he had a cardiac hx.  [name] CAD Sister    Social History  Substance Use Topics  . Smoking status: Never Smoker   . Smokeless tobacco: None  . Alcohol Use: No    Review of Systems  Constitutional: Positive for diaphoresis.  Respiratory: Positive for shortness of breath.   Cardiovascular: Positive for chest pain. Negative for leg swelling.  Gastrointestinal: Positive for nausea. Negative for vomiting.  All other systems reviewed and are negative.     Allergies  Review of patient's allergies indicates no known  allergies.  Home Medications   Prior to Admission medications   Medication Sig Start Date End Date Taking? Authorizing Provider  Alirocumab (PRALUENT) 75 MG/ML SOPN Inject 1 pen into the skin every 14 (fourteen) days. 04/15/15   Jolaine Artist, MD  allopurinol (ZYLOPRIM) 100 MG tablet Take 100 mg by mouth 2 (two) times daily.     Historical Provider, MD  amLODipine (NORVASC) 10 MG tablet Take 10 mg by mouth daily.    Historical Provider, MD  aspirin 81 MG tablet Take 81 mg by mouth daily.    Historical Provider, MD  calcitRIOL (ROCALTROL) 0.25 MCG capsule Take 0.25 mcg by mouth every other day. Take on Monday, Wednesday and Friday    Historical Provider, MD  carvedilol (COREG) 25 MG tablet Take 1 tablet (25 mg total) by mouth 2 (two) times daily with a meal. 06/03/15   Arnoldo Morale, MD  cholecalciferol (VITAMIN D) 1000 UNITS tablet Take 1,000 Units by mouth daily.    Historical Provider, MD  ezetimibe (ZETIA) 10 MG tablet Take 10 mg by mouth daily.    Historical Provider, MD  furosemide (LASIX) 40 MG tablet Take 40 mg by mouth daily.     Historical Provider, MD  hydrALAZINE (APRESOLINE) 50 MG tablet TAKE 1 TABLET 3 TIMES A DAY 04/26/15   Jolaine Artist, MD  isosorbide dinitrate (ISORDIL) 20 MG tablet TAKE 1 TABLET 3 TIMES A DAY 04/26/15   Jolaine Artist, MD  nitroGLYCERIN (NITROSTAT) 0.4 MG SL tablet Place 1 tablet (0.4 mg total) under the tongue every 5 (five) minutes as needed for chest pain. Reported on 04/12/2015 06/03/15   Arnoldo Morale, MD  omega-3 acid ethyl esters (LOVAZA) 1 G capsule Take 1 g by mouth daily.     Historical Provider, MD  ondansetron (ZOFRAN) 4 MG tablet Take 4 mg by mouth every 8 (eight) hours as needed for nausea or vomiting. Reported on 05/24/2015    Historical Provider, MD  PANTOPRAZOLE SODIUM PO Take 40 mg by mouth as directed.     Historical Provider, MD  rosuvastatin (CRESTOR) 40 MG tablet Take 40 mg by mouth daily.    Historical Provider, MD   sacubitril-valsartan (ENTRESTO) 97-103 MG Take 1 tablet by mouth 2 (two) times daily. 02/09/15   Jolaine Artist, MD  spironolactone (ALDACTONE) 25 MG tablet Take 25 mg by mouth daily.    Historical Provider, MD  ticagrelor (BRILINTA) 90 MG TABS tablet Take 1 tablet (90 mg total) by mouth 2 (two) times daily. 04/15/15   Jolaine Artist, MD   Triage vitals: BP 181/119 mmHg  Pulse 101  Temp(Src) 99.1 F (37.3 C) (Oral)  Resp 43  SpO2 99% Physical Exam  Constitutional: He is oriented to person, place,  and time. He appears well-developed and well-nourished. No distress.  HENT:  Head: Normocephalic and atraumatic.  Right Ear: Hearing normal.  Left Ear: Hearing normal.  Nose: Nose normal.  Mouth/Throat: Oropharynx is clear and moist and mucous membranes are normal.  Eyes: Conjunctivae and EOM are normal. Pupils are equal, round, and reactive to light.  Neck: Normal range of motion. Neck supple.  Cardiovascular: Regular rhythm, S1 normal and S2 normal.  Exam reveals no gallop and no friction rub.   No murmur heard. Pulmonary/Chest: Effort normal. No respiratory distress. He exhibits no tenderness.  tachypneic  Abdominal: Soft. Normal appearance and bowel sounds are normal. There is no hepatosplenomegaly. There is no tenderness. There is no rebound, no guarding, no tenderness at McBurney's point and negative Murphy's sign. No hernia.  Musculoskeletal: Normal range of motion.  Neurological: He is alert and oriented to person, place, and time. He has normal strength. No cranial nerve deficit or sensory deficit. Coordination normal. GCS eye subscore is 4. GCS verbal subscore is 5. GCS motor subscore is 6.  Skin: Skin is warm, dry and intact. No rash noted. No cyanosis.  Psychiatric: He has a normal mood and affect. His speech is normal and behavior is normal. Thought content normal.  Nursing note and vitals reviewed.   ED Course  Procedures  DIAGNOSTIC STUDIES: Oxygen Saturation is 99%  on RA, normal by my interpretation.  COORDINATION OF CARE:  11:18 PM DG Chest 1 View will be ordered. Discussed treatment plan which includes nitroglycerin and lab work  with pt at bedside and pt agreed to plan.  Labs Review Labs Reviewed  CBC WITH DIFFERENTIAL/PLATELET - Abnormal; Notable for the following:    RBC 3.87 (*)    Hemoglobin 12.9 (*)    MCV 101.0 (*)    Platelets 149 (*)    All other components within normal limits  COMPREHENSIVE METABOLIC PANEL - Abnormal; Notable for the following:    Potassium 3.2 (*)    Chloride 114 (*)    Glucose, Bld 105 (*)    BUN 21 (*)    Creatinine, Ser 1.77 (*)    Calcium 8.7 (*)    Albumin 3.4 (*)    GFR calc non Af Amer 43 (*)    GFR calc Af Amer 50 (*)    All other components within normal limits  TROPONIN I - Abnormal; Notable for the following:    Troponin I 0.04 (*)    All other components within normal limits  BRAIN NATRIURETIC PEPTIDE  HEPARIN LEVEL (UNFRACTIONATED)  CBC    Imaging Review Dg Chest Port 1 View  06/21/2015  CLINICAL DATA:  Chest pain EXAM: PORTABLE CHEST 1 VIEW COMPARISON:  05/24/2015 FINDINGS: Cardiomegaly, chronic. Stable aortic and hilar contours. Coronary stenting noted in the bilateral circulation. Dual-chamber ICD/ pacer with unchanged lead alignment. No failure or pneumonia. No effusion or pneumothorax. IMPRESSION: Cardiomegaly without failure. Electronically Signed   By: Monte Fantasia M.D.   On: 06/21/2015 23:34   I have personally reviewed and evaluated these images and lab results as part of my medical decision-making.   EKG Interpretation   Date/Time:  Monday June 21 2015 23:10:43 EDT Ventricular Rate:  107 PR Interval:  172 QRS Duration: 125 QT Interval:  374 QTC Calculation: 499 R Axis:   80 Text Interpretation:  Sinus tachycardia Ventricular premature complex  Biatrial enlargement LVH with secondary repolarization abnormality  Borderline prolonged QT interval No significant change since  last tracing  Confirmed by [facility]  MD, Harrell Gave [phone]) on 06/21/2015 11:14:17 PM      MDM   Final diagnoses:  Unstable angina [facility])    Patient presents to the emergency department for evaluation of chest pain. Patient has an extensive cardiac history. He reports 4 previous MIs with 19 stents placed. Patient reports onset of chest pain approximately 1 hour before arrival in the ER. Pain initially was substernal and radiated to the left arm. He had diaphoresis, nausea, shortness of breath associated with pain. Patient was given nitroglycerin prior to arrival with improvement of his pain. Initially pain was 8 out of 10, currently 5 out of 10 at arrival.  Patient reports that the pain is identical to heart attacks he has had in the past. This was felt to be consistent with acute coronary syndrome and patient was initiated on IV nitroglycerin and heparin. Patient was very hypertensive at arrival, will require titration of nitroglycerin for blood pressure. After initiation of the nitroglycerin, however, patient has become pain-free. Patient will require hospitalization for further management.  0100 - discussed with Cardiology on call, Dr. Claiborne Billings  CRITICAL CARE Performed by: Orpah Greek   Total critical care time: 30 minutes  Critical care time was exclusive of separately billable procedures and treating other patients.  Critical care was necessary to treat or prevent imminent or life-threatening deterioration.  Critical care was time spent personally by me on the following activities: development of treatment plan with patient and/or surrogate as well as nursing, discussions with consultants, evaluation of patient's response to treatment, examination of patient, obtaining history from patient or surrogate, ordering and performing treatments and interventions, ordering and review of laboratory studies, ordering and review of radiographic studies, pulse oximetry and re-evaluation of  patient's condition.   I personally performed the services described in this documentation, which was scribed in my presence. The recorded information has been reviewed and is accurate.    Orpah Greek, MD 06/22/15 [id number]  Orpah Greek, MD 06/22/15 [place]

## 2015-06-21 NOTE — ED Notes (Signed)
Patient comes EMS with chest pain that started 1 hour ago at rest.  Patient was diaphoretic with left sided chest pain that radiates to the left arm. Patient has extensive cardiac history with stent placements. EMS gave 324 Asprin and 3 nitro's PTA.  Patient A&Ox4

## 2015-06-22 DIAGNOSIS — Z7902 Long term (current) use of antithrombotics/antiplatelets: Secondary | ICD-10-CM | POA: Diagnosis not present

## 2015-06-22 DIAGNOSIS — I130 Hypertensive heart and chronic kidney disease with heart failure and stage 1 through stage 4 chronic kidney disease, or unspecified chronic kidney disease: Secondary | ICD-10-CM | POA: Diagnosis present

## 2015-06-22 DIAGNOSIS — I25110 Atherosclerotic heart disease of native coronary artery with unstable angina pectoris: Secondary | ICD-10-CM | POA: Diagnosis present

## 2015-06-22 DIAGNOSIS — N183 Chronic kidney disease, stage 3 (moderate): Secondary | ICD-10-CM | POA: Diagnosis not present

## 2015-06-22 DIAGNOSIS — Z7982 Long term (current) use of aspirin: Secondary | ICD-10-CM | POA: Diagnosis not present

## 2015-06-22 DIAGNOSIS — I200 Unstable angina: Secondary | ICD-10-CM | POA: Diagnosis not present

## 2015-06-22 DIAGNOSIS — I5023 Acute on chronic systolic (congestive) heart failure: Secondary | ICD-10-CM | POA: Diagnosis present

## 2015-06-22 DIAGNOSIS — I252 Old myocardial infarction: Secondary | ICD-10-CM | POA: Diagnosis not present

## 2015-06-22 DIAGNOSIS — I255 Ischemic cardiomyopathy: Secondary | ICD-10-CM | POA: Diagnosis present

## 2015-06-22 DIAGNOSIS — Z8679 Personal history of other diseases of the circulatory system: Secondary | ICD-10-CM | POA: Diagnosis not present

## 2015-06-22 DIAGNOSIS — N289 Disorder of kidney and ureter, unspecified: Secondary | ICD-10-CM | POA: Diagnosis not present

## 2015-06-22 DIAGNOSIS — Z515 Encounter for palliative care: Secondary | ICD-10-CM | POA: Diagnosis not present

## 2015-06-22 DIAGNOSIS — I25118 Atherosclerotic heart disease of native coronary artery with other forms of angina pectoris: Secondary | ICD-10-CM | POA: Diagnosis not present

## 2015-06-22 DIAGNOSIS — K047 Periapical abscess without sinus: Secondary | ICD-10-CM | POA: Diagnosis not present

## 2015-06-22 DIAGNOSIS — Z79899 Other long term (current) drug therapy: Secondary | ICD-10-CM | POA: Diagnosis not present

## 2015-06-22 DIAGNOSIS — E785 Hyperlipidemia, unspecified: Secondary | ICD-10-CM | POA: Diagnosis present

## 2015-06-22 DIAGNOSIS — K0251 Dental caries on pit and fissure surface limited to enamel: Secondary | ICD-10-CM | POA: Diagnosis not present

## 2015-06-22 DIAGNOSIS — I110 Hypertensive heart disease with heart failure: Secondary | ICD-10-CM | POA: Diagnosis not present

## 2015-06-22 DIAGNOSIS — Z8249 Family history of ischemic heart disease and other diseases of the circulatory system: Secondary | ICD-10-CM | POA: Diagnosis not present

## 2015-06-22 DIAGNOSIS — I169 Hypertensive crisis, unspecified: Secondary | ICD-10-CM | POA: Diagnosis present

## 2015-06-22 DIAGNOSIS — R570 Cardiogenic shock: Secondary | ICD-10-CM | POA: Diagnosis not present

## 2015-06-22 DIAGNOSIS — Z955 Presence of coronary angioplasty implant and graft: Secondary | ICD-10-CM | POA: Diagnosis not present

## 2015-06-22 DIAGNOSIS — I10 Essential (primary) hypertension: Secondary | ICD-10-CM | POA: Diagnosis not present

## 2015-06-22 DIAGNOSIS — N179 Acute kidney failure, unspecified: Secondary | ICD-10-CM | POA: Diagnosis not present

## 2015-06-22 DIAGNOSIS — Z01810 Encounter for preprocedural cardiovascular examination: Secondary | ICD-10-CM | POA: Diagnosis not present

## 2015-06-22 DIAGNOSIS — Z95810 Presence of automatic (implantable) cardiac defibrillator: Secondary | ICD-10-CM | POA: Diagnosis not present

## 2015-06-22 LAB — LIPID PANEL
CHOL/HDL RATIO: 2.3 ratio
CHOLESTEROL: 126 mg/dL (ref 0–200)
HDL: 55 mg/dL (ref >40)
LDL CALC: 29 mg/dL (ref 0–99)
TRIGLYCERIDES: 208 mg/dL — AB (ref <150)
VLDL: 42 mg/dL — AB (ref 0–40)

## 2015-06-22 LAB — URINE MICROSCOPIC-ADD ON
BACTERIA UA: NONE SEEN
RBC / HPF: NONE SEEN RBC/hpf (ref 0–5)

## 2015-06-22 LAB — CBC
HCT: 38 % — ABNORMAL LOW (ref 39.0–52.0)
HCT: 39.9 % (ref 39.0–52.0)
Hemoglobin: 12.3 g/dL — ABNORMAL LOW (ref 13.0–17.0)
Hemoglobin: 13.2 g/dL (ref 13.0–17.0)
MCH: 32.7 pg (ref 26.0–34.0)
MCH: 33.3 pg (ref 26.0–34.0)
MCHC: 32.4 g/dL (ref 30.0–36.0)
MCHC: 33.1 g/dL (ref 30.0–36.0)
MCV: 101.1 fL — AB (ref 78.0–100.0)
MCV: 100.8 fL — ABNORMAL HIGH (ref 78.0–100.0)
PLATELETS: 149 10*3/uL — AB (ref 150–400)
PLATELETS: 141 10*3/uL — AB (ref 150–400)
RBC: 3.76 MIL/uL — ABNORMAL LOW (ref 4.22–5.81)
RBC: 3.96 MIL/uL — ABNORMAL LOW (ref 4.22–5.81)
RDW: 13.3 % (ref 11.5–15.5)
RDW: 13.1 % (ref 11.5–15.5)
WBC: 7.7 10*3/uL (ref 4.0–10.5)
WBC: 7.1 10*3/uL (ref 4.0–10.5)

## 2015-06-22 LAB — COMPREHENSIVE METABOLIC PANEL
ALBUMIN: 3.7 g/dL (ref 3.5–5.0)
ALK PHOS: 69 U/L (ref 38–126)
ALT: 36 U/L (ref 17–63)
ALT: 34 U/L (ref 17–63)
ANION GAP: 11 (ref 5–15)
AST: 37 U/L (ref 15–41)
AST: 29 U/L (ref 15–41)
Albumin: 3.4 g/dL — ABNORMAL LOW (ref 3.5–5.0)
Alkaline Phosphatase: 62 U/L (ref 38–126)
Anion gap: 6 (ref 5–15)
BILIRUBIN TOTAL: 0.9 mg/dL (ref 0.3–1.2)
BUN: 21 mg/dL — AB (ref 6–20)
BUN: 25 mg/dL — ABNORMAL HIGH (ref 6–20)
CALCIUM: 9.5 mg/dL (ref 8.9–10.3)
CHLORIDE: 109 mmol/L (ref 101–111)
CO2: 22 mmol/L (ref 22–32)
CO2: 22 mmol/L (ref 22–32)
CREATININE: 1.77 mg/dL — AB (ref 0.61–1.24)
Calcium: 8.7 mg/dL — ABNORMAL LOW (ref 8.9–10.3)
Chloride: 114 mmol/L — ABNORMAL HIGH (ref 101–111)
Creatinine, Ser: 1.87 mg/dL — ABNORMAL HIGH (ref 0.61–1.24)
GFR calc non Af Amer: 40 mL/min — ABNORMAL LOW (ref >60)
GFR, EST AFRICAN AMERICAN: 50 mL/min — AB (ref >60)
GFR, EST AFRICAN AMERICAN: 47 mL/min — AB (ref >60)
GFR, EST NON AFRICAN AMERICAN: 43 mL/min — AB (ref >60)
GLUCOSE: 99 mg/dL (ref 65–99)
Glucose, Bld: 105 mg/dL — ABNORMAL HIGH (ref 65–99)
POTASSIUM: 3.2 mmol/L — AB (ref 3.5–5.1)
Potassium: 4.1 mmol/L (ref 3.5–5.1)
Sodium: 142 mmol/L (ref 135–145)
Sodium: 142 mmol/L (ref 135–145)
TOTAL PROTEIN: 6.9 g/dL (ref 6.5–8.1)
Total Bilirubin: 1.1 mg/dL (ref 0.3–1.2)
Total Protein: 7.4 g/dL (ref 6.5–8.1)

## 2015-06-22 LAB — HEPARIN LEVEL (UNFRACTIONATED)
Heparin Unfractionated: 0.26 IU/mL — ABNORMAL LOW (ref 0.30–0.70)
Heparin Unfractionated: 0.38 IU/mL (ref 0.30–0.70)
Heparin Unfractionated: 0.28 IU/mL — ABNORMAL LOW (ref 0.30–0.70)

## 2015-06-22 LAB — URINALYSIS, ROUTINE W REFLEX MICROSCOPIC
BILIRUBIN URINE: NEGATIVE
Glucose, UA: NEGATIVE mg/dL
Hgb urine dipstick: NEGATIVE
Ketones, ur: 15 mg/dL — AB
Leukocytes, UA: NEGATIVE
NITRITE: NEGATIVE
PROTEIN: 100 mg/dL — AB
SPECIFIC GRAVITY, URINE: 1.025 (ref 1.005–1.030)
pH: 5.5 (ref 5.0–8.0)

## 2015-06-22 LAB — MRSA PCR SCREENING: MRSA BY PCR: NEGATIVE

## 2015-06-22 LAB — TSH: TSH: 0.33 u[IU]/mL — ABNORMAL LOW (ref 0.350–4.500)

## 2015-06-22 LAB — TROPONIN I
TROPONIN I: 0.04 ng/mL — AB (ref <0.031)
Troponin I: 0.06 ng/mL — ABNORMAL HIGH (ref <0.031)
Troponin I: 0.06 ng/mL — ABNORMAL HIGH (ref <0.031)

## 2015-06-22 LAB — BRAIN NATRIURETIC PEPTIDE

## 2015-06-22 MED ORDER — AMLODIPINE BESYLATE 10 MG PO TABS
10.0000 mg | ORAL_TABLET | Freq: Every day | ORAL | Status: DC
  Administered 2015-06-22 – 2015-06-23 (×2): 10 mg via ORAL
  Filled 2015-06-22 (×2): qty 1

## 2015-06-22 MED ORDER — SODIUM CHLORIDE 0.9% FLUSH: 3.0000 mL | INTRAVENOUS | Status: DC | PRN

## 2015-06-22 MED ORDER — CARVEDILOL 25 MG PO TABS
25.0000 mg | ORAL_TABLET | Freq: Two times a day (BID) | ORAL | Status: DC
  Administered 2015-06-22 – 2015-06-23 (×3): 25 mg via ORAL
  Filled 2015-06-22 (×4): qty 1

## 2015-06-22 MED ORDER — EZETIMIBE 10 MG PO TABS
10.0000 mg | ORAL_TABLET | Freq: Every day | ORAL | Status: DC
  Administered 2015-06-22 – 2015-06-30 (×9): 10 mg via ORAL
  Filled 2015-06-22 (×9): qty 1

## 2015-06-22 MED ORDER — NITROGLYCERIN IN D5W 200-5 MCG/ML-% IV SOLN: 0.0000 ug/min | INTRAVENOUS | Status: DC

## 2015-06-22 MED ORDER — VITAMIN D 1000 UNITS PO TABS
1000.0000 [IU] | ORAL_TABLET | Freq: Every day | ORAL | Status: DC
  Administered 2015-06-23 – 2015-06-30 (×8): 1000 [IU] via ORAL
  Filled 2015-06-22 (×8): qty 1

## 2015-06-22 MED ORDER — ONDANSETRON HCL 4 MG PO TABS: 4.0000 mg | ORAL_TABLET | Freq: Three times a day (TID) | ORAL | Status: DC | PRN

## 2015-06-22 MED ORDER — ASPIRIN EC 81 MG PO TBEC
81.0000 mg | DELAYED_RELEASE_TABLET | Freq: Every day | ORAL | Status: DC
  Administered 2015-06-22 – 2015-06-30 (×8): 81 mg via ORAL
  Filled 2015-06-22 (×8): qty 1

## 2015-06-22 MED ORDER — FUROSEMIDE 10 MG/ML IJ SOLN
80.0000 mg | Freq: Once | INTRAMUSCULAR | Status: AC
  Administered 2015-06-22: 80 mg via INTRAVENOUS
  Filled 2015-06-22: qty 8

## 2015-06-22 MED ORDER — OMEGA-3-ACID ETHYL ESTERS 1 G PO CAPS
1.0000 g | ORAL_CAPSULE | Freq: Every day | ORAL | Status: DC
  Administered 2015-06-22 – 2015-06-30 (×9): 1 g via ORAL
  Filled 2015-06-22 (×9): qty 1

## 2015-06-22 MED ORDER — SPIRONOLACTONE 25 MG PO TABS
25.0000 mg | ORAL_TABLET | Freq: Every day | ORAL | Status: DC
  Administered 2015-06-22 – 2015-06-23 (×2): 25 mg via ORAL
  Filled 2015-06-22 (×2): qty 1

## 2015-06-22 MED ORDER — ASPIRIN 81 MG PO TABS: 81.0000 mg | ORAL_TABLET | Freq: Every day | ORAL | Status: DC

## 2015-06-22 MED ORDER — CALCITRIOL 0.25 MCG PO CAPS
0.2500 ug | ORAL_CAPSULE | ORAL | Status: DC
  Administered 2015-06-23 – 2015-06-29 (×4): 0.25 ug via ORAL
  Filled 2015-06-22 (×4): qty 1

## 2015-06-22 MED ORDER — NITROGLYCERIN 0.4 MG SL SUBL
0.4000 mg | SUBLINGUAL_TABLET | SUBLINGUAL | Status: DC | PRN
Start: 2015-06-22 — End: 2015-06-30

## 2015-06-22 MED ORDER — SODIUM CHLORIDE 0.9 % IV SOLN: 250.0000 mL | INTRAVENOUS | Status: DC | PRN

## 2015-06-22 MED ORDER — SACUBITRIL-VALSARTAN 97-103 MG PO TABS
1.0000 | ORAL_TABLET | Freq: Two times a day (BID) | ORAL | Status: DC
  Administered 2015-06-22 – 2015-06-23 (×4): 1 via ORAL
  Filled 2015-06-22 (×6): qty 1

## 2015-06-22 MED ORDER — POTASSIUM CHLORIDE CRYS ER 20 MEQ PO TBCR
40.0000 meq | EXTENDED_RELEASE_TABLET | ORAL | Status: AC
  Administered 2015-06-22 (×2): 40 meq via ORAL
  Filled 2015-06-22 (×2): qty 2

## 2015-06-22 MED ORDER — TICAGRELOR 90 MG PO TABS
90.0000 mg | ORAL_TABLET | Freq: Two times a day (BID) | ORAL | Status: DC
  Administered 2015-06-22 – 2015-06-30 (×17): 90 mg via ORAL
  Filled 2015-06-22 (×17): qty 1

## 2015-06-22 MED ORDER — ROSUVASTATIN CALCIUM 20 MG PO TABS
40.0000 mg | ORAL_TABLET | Freq: Every day | ORAL | Status: DC
  Administered 2015-06-22 – 2015-06-30 (×9): 40 mg via ORAL
  Filled 2015-06-22: qty 2
  Filled 2015-06-22: qty 4
  Filled 2015-06-22 (×3): qty 2
  Filled 2015-06-22: qty 1
  Filled 2015-06-22 (×3): qty 2

## 2015-06-22 MED ORDER — ACETAMINOPHEN 325 MG PO TABS: 650.0000 mg | ORAL_TABLET | ORAL | Status: DC | PRN

## 2015-06-22 MED ORDER — ONDANSETRON HCL 4 MG/2ML IJ SOLN: 4.0000 mg | Freq: Four times a day (QID) | INTRAMUSCULAR | Status: DC | PRN

## 2015-06-22 MED ORDER — HYDRALAZINE HCL 50 MG PO TABS
75.0000 mg | ORAL_TABLET | Freq: Three times a day (TID) | ORAL | Status: DC
  Administered 2015-06-22 – 2015-06-23 (×5): 75 mg via ORAL
  Filled 2015-06-22 (×4): qty 1
  Filled 2015-06-22: qty 3

## 2015-06-22 MED ORDER — ISOSORBIDE DINITRATE 10 MG PO TABS
20.0000 mg | ORAL_TABLET | Freq: Three times a day (TID) | ORAL | Status: DC
  Administered 2015-06-22 – 2015-06-23 (×4): 20 mg via ORAL
  Filled 2015-06-22: qty 1
  Filled 2015-06-22 (×4): qty 2

## 2015-06-22 MED ORDER — SODIUM CHLORIDE 0.9% FLUSH
3.0000 mL | Freq: Two times a day (BID) | INTRAVENOUS | Status: DC
  Administered 2015-06-22 – 2015-06-25 (×6): 3 mL via INTRAVENOUS

## 2015-06-22 MED ORDER — ONDANSETRON HCL 4 MG/2ML IJ SOLN
4.0000 mg | Freq: Once | INTRAMUSCULAR | Status: AC
  Administered 2015-06-22: 4 mg via INTRAVENOUS
  Filled 2015-06-22: qty 2

## 2015-06-22 NOTE — Progress Notes (Signed)
ANTICOAGULATION CONSULT NOTE - Follow Up Consult  Pharmacy Consult for heparin Indication: chest pain/ACS  No Known Allergies  Patient Measurements:   Heparin Dosing Weight: .66.3 kg  Vital Signs: Temp: 97.8 F (36.6 C) (06/06 1358) Temp Source: Oral (06/06 1358) BP: 160/126 mmHg (06/06 1358) Pulse Rate: 106 (06/06 1358)  Labs:  Recent Labs  06/21/15 2323 06/22/15 0615 06/22/15 1337  HGB 12.9* 12.3*  --   HCT 39.1 38.0*  --   PLT 149* 149*  --   HEPARINUNFRC  --  0.26* 0.38  CREATININE 1.77*  --   --   TROPONINI 0.04*  --   --     CrCl cannot be calculated (Unknown ideal weight.).   Medications:  Infusions:  . heparin 900 Units/hr (06/22/15 0743)    Assessment: 50 yo male on IV heparin for r/o ACS with therapeutic heparin level on heparin at 900 units/hr.  No bleeding or complications noted.  CBC stable.  Awaiting inpatient bed.  Goal of Therapy:  Heparin level 0.3-0.7 units/ml Monitor platelets by anticoagulation protocol: Yes   Plan:  1. Continue IV heparin at current rate. 2. Confirm heparin level in 6 hrs. 3. Daily heparin level and CBC. 4. F/u plans for further cardiac workup.  Uvaldo Rising, BCPS  Clinical Pharmacist Pager [phone]  06/22/2015 2:31 PM

## 2015-06-22 NOTE — ED Notes (Signed)
Pt desatting to 83% on RA, placed on 2L [place]

## 2015-06-22 NOTE — H&P (Signed)
Marvin Martin is an 50 y.o. male.    Chief Complaint: chest pain Primary Cardiologist: Dr. Haroldine Laws HPI: Marvin Martin is a 50 yo man with PMH of hypertension, ischemic cardiomyopathy, hypertension, stage III CKD, 17 previous stents, last known EF 20-25%, St. Jude ICD, dyslipidemia who follows closely with the HF clinic and presents tonight with sudden onset of sharp, central chest discomfort that radiates to his left arm. He endorses associated dyspnea, nausea and diaphoresis with pain as severe as 8/10 that improved to 5/10 and gradually eased off with heparin gtt and NTG gtt here in the hospital. He has been compliant with his ticagrelor and aspirin at home. He is currently chest pain free. He was just about to start cardiac rehabilitation.   He received 324 mg asa from EMS.   He was recently admitted May 8-12 with chest pain and has had a recent LHC, 5/11, without any interventions. RHC at that time RAP 1, RV 22/1, PA 27/13, mean 19, PCWP 6, LVEDP 13, Fick CO/CI 3.3/1.9, PVR 4, PA saturation 60%, 01/15/15 CPX with peak VO2 20.3, VE/VCO2 26.8, peak RER 1.21  His dry weight is 144-146 lbs.     Past Medical History  Diagnosis Date  . Chronic systolic CHF (congestive heart failure) ([place])     a. 01/2015 Echo: EF 20-25%, antlat, inflat AK.  [name] Hyperlipidemia   . Coronary artery disease     a. s/p MI x 4;  b. Reported h/o 17 stents with recurrent ISR;  c. 02/2014 Cath ([place]): PCI to unknown vessel;  d. 01/2015 MV: EF 18%, large scar, no ischemia; e. 03/2015 PCI: LM nl, LAD nl, D2 patent stent, RI patent stent, LCX patent stents p/m, RCA 95p ISR (PTCA), 1m 100d into RPL CTO, RPDA 90 (2.5x16 Synergy DES).  . Hypertensive heart disease   . Ischemic cardiomyopathy     a. s/p SJM ICD 2007 w/ gen change in 2012; b. 12/2014 CPX Test: mild to mod HF limitation w/ pVO2 20.3 and nl slope;  c. 01/2015 Echo: EF 20-25%.  . CKD (chronic kidney disease), stage III     Past Surgical History  Procedure  Laterality Date  . 1Barreplacements Bilateral   . Pacemaker generator change Bilateral   . Insert / replace / remove pacemaker      St jude 2006  Generator Change 2012   . Cardiac catheterization      Most recent 02/2014   . Cardiac catheterization N/A 03/31/2015    Procedure: Left Heart Cath and Coronary Angiography;  Surgeon: TTroy Sine MD;  [facility];  Service: Cardiovascular;  Laterality: N/A;  . Cardiac catheterization N/A 03/31/2015    Procedure: Coronary Stent Intervention;  Surgeon: TTroy Sine MD;  [facility];  Service: Cardiovascular;  Laterality: N/A;  . Cardiac catheterization N/A 05/27/2015    Procedure: Right/Left Heart Cath and Coronary Angiography;  Surgeon: DJolaine Artist MD;  [facility];  Service: Cardiovascular;  Laterality: N/A;    Family History  Problem Relation Age of Onset  . Cancer Mother     died when pt was 17  She had a h/o heart dzs as well.  . Hypertension Mother   . Hyperlipidemia Father     Father died when pt was age 50- he believes that he had a cardiac hx.  .Marland KitchenCAD Sister    Social History:  reports that he has never smoked. He does not  have any smokeless tobacco history on file. He reports that he does not drink alcohol or use illicit drugs.  Allergies: No Known Allergies   (Not in a hospital admission)  Results for orders placed or performed during the hospital encounter of 06/21/15 (from the past 48 hour(s))  CBC with Differential/Platelet     Status: Abnormal   Collection Time: 06/21/15 11:23 PM  Result Value Ref Range   WBC 6.8 4.0 - 10.5 K/uL   RBC 3.87 (L) 4.22 - 5.81 MIL/uL   Hemoglobin 12.9 (L) 13.0 - 17.0 g/dL   HCT 39.1 39.0 - 52.0 %   MCV 101.0 (H) 78.0 - 100.0 fL   MCH 33.3 26.0 - 34.0 pg   MCHC 33.0 30.0 - 36.0 g/dL   RDW 13.2 11.5 - 15.5 %   Platelets 149 (L) 150 - 400 K/uL   Neutrophils Relative % 77 %   Neutro Abs 5.2 1.7 - 7.7 K/uL   Lymphocytes  Relative 17 %   Lymphs Abs 1.1 0.7 - 4.0 K/uL   Monocytes Relative 4 %   Monocytes Absolute 0.3 0.1 - 1.0 K/uL   Eosinophils Relative 2 %   Eosinophils Absolute 0.1 0.0 - 0.7 K/uL   Basophils Relative 0 %   Basophils Absolute 0.0 0.0 - 0.1 K/uL  Comprehensive metabolic panel     Status: Abnormal   Collection Time: 06/21/15 11:23 PM  Result Value Ref Range   Sodium 142 135 - 145 mmol/L   Potassium 3.2 (L) 3.5 - 5.1 mmol/L   Chloride 114 (H) 101 - 111 mmol/L   CO2 22 22 - 32 mmol/L   Glucose, Bld 105 (H) 65 - 99 mg/dL   BUN 21 (H) 6 - 20 mg/dL   Creatinine, Ser 1.77 (H) 0.61 - 1.24 mg/dL   Calcium 8.7 (L) 8.9 - 10.3 mg/dL   Total Protein 6.9 6.5 - 8.1 g/dL   Albumin 3.4 (L) 3.5 - 5.0 g/dL   AST 37 15 - 41 U/L   ALT 36 17 - 63 U/L   Alkaline Phosphatase 62 38 - 126 U/L   Total Bilirubin 0.9 0.3 - 1.2 mg/dL   GFR calc non Af Amer 43 (L) >60 mL/min   GFR calc Af Amer 50 (L) >60 mL/min    Comment: (NOTE) The eGFR has been calculated using the CKD EPI equation. This calculation has not been validated in all clinical situations. eGFR's persistently <60 mL/min signify possible Chronic Kidney Disease.    Anion gap 6 5 - 15  Troponin I     Status: Abnormal   Collection Time: 06/21/15 11:23 PM  Result Value Ref Range   Troponin I 0.04 (H) <0.031 ng/mL    Comment:        PERSISTENTLY INCREASED TROPONIN VALUES IN THE RANGE OF 0.04-0.49 ng/mL CAN BE SEEN IN:       -UNSTABLE ANGINA       -CONGESTIVE HEART FAILURE       -MYOCARDITIS       -CHEST TRAUMA       -ARRYHTHMIAS       -LATE PRESENTING MYOCARDIAL INFARCTION       -COPD   CLINICAL FOLLOW-UP RECOMMENDED.   Brain natriuretic peptide     Status: Abnormal   Collection Time: 06/21/15 11:23 PM  Result Value Ref Range   B Natriuretic Peptide >4500.0 (H) 0.0 - 100.0 pg/mL   Dg Chest Port 1 View  06/21/2015  CLINICAL DATA:  Chest pain EXAM: PORTABLE  CHEST 1 VIEW COMPARISON:  05/24/2015 FINDINGS: Cardiomegaly, chronic. Stable  aortic and hilar contours. Coronary stenting noted in the bilateral circulation. Dual-chamber ICD/ pacer with unchanged lead alignment. No failure or pneumonia. No effusion or pneumothorax. IMPRESSION: Cardiomegaly without failure. Electronically Signed   By: Monte Fantasia M.D.   On: 06/21/2015 23:34    Review of Systems  Constitutional: Positive for diaphoresis. Negative for fever, chills and weight loss.  HENT: Negative for ear discharge, ear pain and tinnitus.   Eyes: Negative for double vision and photophobia.  Respiratory: Positive for shortness of breath. Negative for hemoptysis.   Cardiovascular: Positive for chest pain and orthopnea. Negative for leg swelling.  Gastrointestinal: Negative for nausea, vomiting and abdominal pain.  Genitourinary: Negative for dysuria and urgency.  Musculoskeletal: Negative for myalgias and neck pain.  Skin: Negative for rash.  Neurological: Negative for dizziness, tingling and tremors.  Endo/Heme/Allergies: Negative for polydipsia.  Psychiatric/Behavioral: Negative for depression, suicidal ideas and substance abuse.    Blood pressure 180/118, pulse 100, temperature 99.1 F (37.3 C), temperature source Oral, resp. rate 23, SpO2 94 %. Physical Exam  Nursing note and vitals reviewed. Constitutional: He is oriented to person, place, and time. He appears well-developed and well-nourished. No distress.  HENT:  Head: Normocephalic and atraumatic.  Nose: Nose normal.  Mouth/Throat: No oropharyngeal exudate.  Eyes: Conjunctivae and EOM are normal. Pupils are equal, round, and reactive to light. No scleral icterus.  Neck: Normal range of motion. Neck supple. JVD present. No tracheal deviation present.  JVP 3 cm above clavicle  Cardiovascular: Normal rate, regular rhythm and intact distal pulses.  Exam reveals no gallop.   Murmur heard. Respiratory: Effort normal and breath sounds normal. No respiratory distress. He has no wheezes. He has no rales.  GI:  Soft. Bowel sounds are normal. He exhibits no distension. There is no tenderness. There is no rebound.  Musculoskeletal: Normal range of motion. He exhibits no edema.  Neurological: He is alert and oriented to person, place, and time. No cranial nerve deficit.  Skin: Skin is warm and dry. No rash noted. He is not diaphoretic. No erythema.  Psychiatric: He has a normal mood and affect. His behavior is normal. Thought content normal.    Labs reviewed; , bun/cr 27/1.77 1/17 Echo EF 2025%, moderate LVH  Assessment/Plan Marvin Martin is a 50 yo man with PMH of hypertension, ischemic cardiomyopathy, hypertension, stage III CKD, 17 previous stents, last known EF 20-25%, St. Jude ICD, dyslipidemia who presents with chest pain. Differential diagnosis is musculoskeletal pain, esophageal spasm, GERD, pericarditis, ACS/NSTEMI among other etiologies. I favor a diagnosis of unstable angina given type of pain for him; however, recent LHC with intervenable lesion and negative troponins. Continue heparin gtt, NTG gtt for blood pressure control and treat medically. If troponins rise, would consider repeat LHC. Will also review cath films with interventional cardiology again to see if any targets for PCI.   Problem List 1. Unstable angina 2. Hypertensive Urgency 3. Acute on chronic systolic heart failure 4. Chronic Kidney disease stage III 5. Dyslipidemia  Plan - trend cardiac markers - observation on telemetry - continue heparin gtt, blood pressure treatment with NTG gtt, continue home BP/HF medications - carvedilol 25 mg bid, entresto 97/103 mg bid, hydralazine/isordil 50/20 mg tid - review cath films in AM - NPO after MN    Brittay Mogle, MD 06/22/2015, 1:08 AM

## 2015-06-22 NOTE — Progress Notes (Signed)
ANTICOAGULATION CONSULT NOTE - Follow Up Consult  Pharmacy Consult for heparin Indication: USAP   Labs:  Recent Labs  06/21/15 2323 06/22/15 0615  HGB 12.9* 12.3*  HCT 39.1 38.0*  PLT 149* 149*  HEPARINUNFRC  --  0.26*  CREATININE 1.77*  --   TROPONINI 0.04*  --      Assessment: 50yo male subtherapeutic on heparin with initial dosing for CP.  Goal of Therapy:  Heparin level 0.3-0.7 units/ml   Plan:  Will increase heparin gtt by 1-2 units/kg/hr to 900 units/hr and check level in 6hr.  Wynona Neat, PharmD, BCPS  06/22/2015,6:57 AM

## 2015-06-22 NOTE — ED Notes (Signed)
MD at bedside updating patient.

## 2015-06-22 NOTE — Progress Notes (Signed)
ANTICOAGULATION CONSULT NOTE - Follow Up Consult  Pharmacy Consult for heparin Indication: chest pain/ACS  No Known Allergies  Patient Measurements: Height: 5\' 7"  (170.2 cm) Weight: 140 lb 8 oz (63.73 kg) IBW/kg (Calculated) : 66.1 Heparin Dosing Weight: 66.3 kg  Vital Signs: Temp: 97.9 F (36.6 C) (06/06 1534) Temp Source: Oral (06/06 1534) BP: 120/85 mmHg (06/06 1836) Pulse Rate: 108 (06/06 1534)  Labs:  Recent Labs  06/21/15 2323 06/22/15 0615 06/22/15 1337 06/22/15 1451 06/22/15 1609 06/22/15 1945  HGB 12.9* 12.3*  --   --  13.2  --   HCT 39.1 38.0*  --   --  39.9  --   PLT 149* 149*  --   --  141*  --   HEPARINUNFRC  --  0.26* 0.38  --   --  0.28*  CREATININE 1.77*  --   --   --  1.87*  --   TROPONINI 0.04*  --   --  0.06*  --   --     Estimated Creatinine Clearance: 42.6 mL/min (by C-G formula based on Cr of 1.87).   Medications:  Infusions:  . heparin 900 Units/hr (06/22/15 0743)  . nitroGLYCERIN      Assessment: 50 yo male on IV heparin for r/o ACS with therapeutic heparin level on heparin at 900 units/hr.  No bleeding or complications noted. CBC stable. Awaiting inpatient bed. Heparin level slightly subtherapeutic this evening at 0.28. Verified with RN no problems with the line/drip. No bleeding noted. Will increase rate and follow up with new level in the morning  Goal of Therapy:  Heparin level 0.3-0.7 units/ml Monitor platelets by anticoagulation protocol: Yes   Plan:  1. Increase IV heparin to 1050 units/hr. 2. Confirm heparin level in 6 hrs. 3. Daily heparin level and CBC. 4. F/u plans for further cardiac workup.   Thank you for allowing [place] to participate in this patients care. Jens Som, PharmD Pager: [phone]  06/22/2015 8:36 PM

## 2015-06-22 NOTE — ED Notes (Signed)
Admitting at bedside 

## 2015-06-22 NOTE — Progress Notes (Signed)
Advanced Heart Failure Rounding Note  PCP: Dr Laney Pastor Primary Cardiologist: Dr Haroldine Laws  Subjective:    Marvin Martin is a 50 year old male with history of CAD, ICM, MI X4 (presents with chest burning radiating to L shoulder) with 17 stents, chronic systolic HF with EF [id number] s/p St. Jude ICD, HTN, gout, and hyperlipidemia. He just relocated to [facility] from [place]. His medical care was provided by [facility]. He has struggled with in-stent restenosis. Most recent heart cath was February 2016 with stent placed  Admitted May 8th through May 12th, 2017 with chest pain. Had LHC as noted below no intervention.   Admitted overnight with worsening SOB accompanied by nausea and diaphoresis. SBP up to 190. Started on nitro drip.  BP improved to 150s on last check.  He denies any recent fever or chills, though Tmax 100.7 in ED. Blood Cultures sent. Pertinent admission labs include K 3.2, Creatinine 1.77, BNP > 4500, initial troponin 0.04.   He denies current chest pain now on nitro gtt. Was his USOH until last night around 8-9 pm. Denies missing any medications, or dietary/fluid non-compliance. Bloods pressures in 120-130s.  + orthopnea and PND.  He denies lightheadedness or dizziness. Breathing has improved.  Has had cough with some white sputum, scant blood.   [facility] 05/27/15  Dist RCA-1 lesion, 100% stenosed. The lesion was previously treated with a stent (unknown type).  Dist RCA-2 lesion, 100% stenosed. The lesion was previously treated with a stent (unknown type).  Prox RCA lesion, 60% stenosed. The lesion was previously treated with a stent (unknown type) and angioplasty.  Mid RCA lesion, 40% stenosed. The lesion was previously treated with a stent (unknown type). Findings: RA = 1 RV = 22/1/5 PA = 27/13 (19) PCW = 6 Ao = 120/78 (96) LV = 124/4/13 Fick cardiac output/index = 3.3/1.9 PVR = 4.0 WU SVR = 2335  FA sat = 94% PA sat = 60%. 60%  Objective:   Weight  Range:   There is no weight on file to calculate BMI.   Vital Signs:   Temp:  [99.1 F (37.3 C)-100.7 F (38.2 C)] 100.7 F (38.2 C) (06/06 0645) Pulse Rate:  [94-121] 112 (06/06 0745) Resp:  [16-57] 30 (06/06 0645) BP: (151-190)/(99-152) 151/99 mmHg (06/06 0745) SpO2:  [89 %-99 %] 94 % (06/06 0745)    Weight change: There were no vitals filed for this visit.  Intake/Output:  No intake or output data in the 24 hours ending 06/22/15 0803   Physical Exam: General: Fatigued appearing, Mildly tachypneic.  HEENT: normal Neck: supple. JVD 8-9. Carotids 2+ bilat; no bruits. No thyromegaly or nodule noted.  Cor: PMI laterally displaced. Regular rate & rhythm. No rubs, gallops or murmurs. Lungs: Mildly tachypneic. Basilar crackles bilaterally, slightly diminished Abdomen: soft, NT, ND, no HSM. No bruits or masses. +BS  Extremities: no cyanosis, clubbing, rash, edema Neuro: alert & oriented x 3, cranial nerves grossly intact. moves all 4 extremities w/o difficulty. Affect pleasant.  Telemetry: Reviewed, Sinus Tach 110s  Labs: CBC  Recent Labs  06/21/15 2323 06/22/15 0615  WBC 6.8 7.7  NEUTROABS 5.2  --   HGB 12.9* 12.3*  HCT 39.1 38.0*  MCV 101.0* 101.1*  PLT 149* 123456*   Basic Metabolic Panel  Recent Labs  06/21/15 2323  NA 142  K 3.2*  CL 114*  CO2 22  GLUCOSE 105*  BUN 21*  CREATININE 1.77*  CALCIUM 8.7*   Liver Function Tests  Recent Labs  06/21/15 2323  AST 37  ALT 36  ALKPHOS 62  BILITOT 0.9  PROT 6.9  ALBUMIN 3.4*   No results for input(s): LIPASE, AMYLASE in the last 72 hours. Cardiac Enzymes  Recent Labs  06/21/15 2323  TROPONINI 0.04*    BNP: BNP (last 3 results)  Recent Labs  05/24/15 1159 06/21/15 2323  BNP >4500.0* >4500.0*    ProBNP (last 3 results) No results for input(s): PROBNP in the last 8760 hours.   D-Dimer No results for input(s): DDIMER in the last 72 hours. Hemoglobin A1C No results for input(s): HGBA1C  in the last 72 hours. Fasting Lipid Panel No results for input(s): CHOL, HDL, LDLCALC, TRIG, CHOLHDL, LDLDIRECT in the last 72 hours. Thyroid Function Tests No results for input(s): TSH, T4TOTAL, T3FREE, THYROIDAB in the last 72 hours.  Invalid input(s): FREET3  Other results:     Imaging/Studies:  Dg Chest Port 1 View  06/21/2015  CLINICAL DATA:  Chest pain EXAM: PORTABLE CHEST 1 VIEW COMPARISON:  05/24/2015 FINDINGS: Cardiomegaly, chronic. Stable aortic and hilar contours. Coronary stenting noted in the bilateral circulation. Dual-chamber ICD/ pacer with unchanged lead alignment. No failure or pneumonia. No effusion or pneumothorax. IMPRESSION: Cardiomegaly without failure. Electronically Signed   By: Monte Fantasia M.D.   On: 06/21/2015 23:34    Latest Echo  Latest Cath   Medications:     Scheduled Medications: . potassium chloride  40 mEq Oral Q4H  . ticagrelor  90 mg Oral BID    Infusions: . heparin 900 Units/hr (06/22/15 0743)    PRN Medications:     Assessment   1. Acute on chronic systolic CHF 2. CAD - MI x 4. Stents x 17.  Most recent [facility] 05/2015 - no intervention continued medical management.   Dist RCA-1 lesion, 100% stenosed. The lesion was previously treated with a stent (unknown type).  Dist RCA-2 lesion, 100% stenosed. The lesion was previously treated with a stent (unknown type).  Prox RCA lesion, 60% stenosed. The lesion was previously treated with a stent (unknown type) and angioplasty. Mid RCA lesion, 40% stenosed. The lesion was previously treated with a stent (unknown type). 3. HTN 4. CKD stage III  Plan    He appears to have at least mild volume overload and BNP > 4500. Will order IV lasix 80 mg now and re-assess.  Supp K.   Continue nitro drip for now.  Goal SBP < 150 for now.  Will titrated meds as tolerated.  Increase hydralazine to 75 mg TID.   Creatinine up slightly. Will follow closely with diuresis.   Of note, his RCA was  60% re-stenosed s/p stenting in 03/31/15 on cath during last admission.   He is currently chest pain free. Will need to follow closely. Continue to cycle troponins. Will likely need repeat LHC to further investigate stenosis of RCA. Will discuss timing with MD.   Length of Stay: 0   Satira Mccallum [facility] PA-C 06/22/2015, 8:03 AM  Advanced Heart Failure Team Pager [phone] (M-F; 7a - 4p)  Please contact [facility] for night-coverage after hours (4p -7a ) and weekends on [email or web address]   Patient seen and examined with Oda Kilts, PA-C. We discussed all aspects of the encounter. I agree with the assessment and plan as stated above. H&P from earlier this am by Dr. Claiborne Billings also reviewed and agree.   He presents with recurrent [place] with HTN crisis and A/C systolic HF. Troponin currently negative. Improved with NTG gtt  and diuresis but remains hypertensive and tachycardic. I am concerned he may be nearing the point where advanced therapies may be better option for him. Will diurese and control BP today. Cycle troponin. Likely plan repeat cath tomorrow with FFR of RCA. Watch fever curve. Check BCX and UA. (Fever could be related to ACS as no signs focal infection).   Warnie Belair,MD 8:41 AM

## 2015-06-23 ENCOUNTER — Encounter (HOSPITAL_COMMUNITY)
Admission: EM | Disposition: A | Discharge status: Home Health | Payer: Self-pay | Source: Home / Self Care | Attending: Internal Medicine

## 2015-06-23 ENCOUNTER — Encounter: Payer: Medicare Other | Admitting: *Deleted

## 2015-06-23 ENCOUNTER — Encounter (HOSPITAL_COMMUNITY): Payer: Self-pay | Admitting: Internal Medicine

## 2015-06-23 ENCOUNTER — Telehealth: Payer: Self-pay | Admitting: Cardiology

## 2015-06-23 DIAGNOSIS — I5023 Acute on chronic systolic (congestive) heart failure: Secondary | ICD-10-CM

## 2015-06-23 DIAGNOSIS — I110 Hypertensive heart disease with heart failure: Secondary | ICD-10-CM

## 2015-06-23 DIAGNOSIS — I25110 Atherosclerotic heart disease of native coronary artery with unstable angina pectoris: Secondary | ICD-10-CM

## 2015-06-23 HISTORY — PX: CARDIAC CATHETERIZATION: SHX172

## 2015-06-23 LAB — POCT I-STAT 3, VENOUS BLOOD GAS (G3P V)
Acid-base deficit: 2 mmol/L (ref 0.0–2.0)
Acid-base deficit: 3 mmol/L — ABNORMAL HIGH (ref 0.0–2.0)
Bicarbonate: 22 meq/L (ref 20.0–24.0)
Bicarbonate: 23.3 meq/L (ref 20.0–24.0)
Bicarbonate: 24.3 meq/L — ABNORMAL HIGH (ref 20.0–24.0)
O2 Saturation: 54 %
O2 Saturation: 55 %
O2 Saturation: 62 %
TCO2: 23 mmol/L (ref 0–100)
TCO2: 25 mmol/L (ref 0–100)
TCO2: 25 mmol/L (ref 0–100)
pCO2, Ven: 37.7 mmHg — ABNORMAL LOW (ref 45.0–50.0)
pCO2, Ven: 38 mmHg — ABNORMAL LOW (ref 45.0–50.0)
pCO2, Ven: 40.1 mmHg — ABNORMAL LOW (ref 45.0–50.0)
pH, Ven: 7.373 — ABNORMAL HIGH (ref 7.250–7.300)
pH, Ven: 7.373 — ABNORMAL HIGH (ref 7.250–7.300)
pH, Ven: 7.414 — ABNORMAL HIGH (ref 7.250–7.300)
pO2, Ven: 29 mmHg — ABNORMAL LOW (ref 31.0–45.0)
pO2, Ven: 30 mmHg — ABNORMAL LOW (ref 31.0–45.0)
pO2, Ven: 32 mmHg (ref 31.0–45.0)

## 2015-06-23 LAB — RAPID URINE DRUG SCREEN, HOSP PERFORMED
Amphetamines: NOT DETECTED
Barbiturates: NOT DETECTED
Benzodiazepines: POSITIVE — AB
Cocaine: NOT DETECTED
Opiates: NOT DETECTED
Tetrahydrocannabinol: NOT DETECTED

## 2015-06-23 LAB — CBC
HCT: 39.7 % (ref 39.0–52.0)
Hemoglobin: 13.1 g/dL (ref 13.0–17.0)
MCH: 33 pg (ref 26.0–34.0)
MCHC: 33 g/dL (ref 30.0–36.0)
MCV: 100 fL (ref 78.0–100.0)
Platelets: 146 10*3/uL — ABNORMAL LOW (ref 150–400)
RBC: 3.97 MIL/uL — ABNORMAL LOW (ref 4.22–5.81)
RDW: 13 % (ref 11.5–15.5)
WBC: 6.2 10*3/uL (ref 4.0–10.5)

## 2015-06-23 LAB — POCT I-STAT 3, ART BLOOD GAS (G3+)
Acid-base deficit: 3 mmol/L — ABNORMAL HIGH (ref 0.0–2.0)
Bicarbonate: 19.4 mEq/L — ABNORMAL LOW (ref 20.0–24.0)
O2 Saturation: 97 %
PCO2 ART: 26.8 mmHg — AB (ref 35.0–45.0)
PH ART: 7.467 — AB (ref 7.350–7.450)
PO2 ART: 81 mmHg (ref 80.0–100.0)
TCO2: 20 mmol/L (ref 0–100)

## 2015-06-23 LAB — PROTIME-INR
INR: 1.19 (ref 0.00–1.49)
Prothrombin Time: 15.3 seconds — ABNORMAL HIGH (ref 11.6–15.2)

## 2015-06-23 LAB — BASIC METABOLIC PANEL
ANION GAP: 10 (ref 5–15)
BUN: 25 mg/dL — ABNORMAL HIGH (ref 6–20)
CHLORIDE: 106 mmol/L (ref 101–111)
CO2: 22 mmol/L (ref 22–32)
Calcium: 9.1 mg/dL (ref 8.9–10.3)
Creatinine, Ser: 1.72 mg/dL — ABNORMAL HIGH (ref 0.61–1.24)
GFR calc non Af Amer: 45 mL/min — ABNORMAL LOW (ref >60)
GFR, EST AFRICAN AMERICAN: 52 mL/min — AB (ref >60)
GLUCOSE: 102 mg/dL — AB (ref 65–99)
POTASSIUM: 3.3 mmol/L — AB (ref 3.5–5.1)
Sodium: 138 mmol/L (ref 135–145)

## 2015-06-23 LAB — GLUCOSE, CAPILLARY: Glucose-Capillary: 307 mg/dL — ABNORMAL HIGH (ref 65–99)

## 2015-06-23 LAB — HEMOGLOBIN A1C
HEMOGLOBIN A1C: 5.1 % (ref 4.8–5.6)
MEAN PLASMA GLUCOSE: 100 mg/dL

## 2015-06-23 LAB — HEPARIN LEVEL (UNFRACTIONATED): Heparin Unfractionated: 0.33 IU/mL (ref 0.30–0.70)

## 2015-06-23 LAB — TROPONIN I: Troponin I: 0.05 ng/mL — ABNORMAL HIGH (ref <0.031)

## 2015-06-23 SURGERY — RIGHT/LEFT HEART CATH AND CORONARY ANGIOGRAPHY

## 2015-06-23 MED ORDER — ENOXAPARIN SODIUM 30 MG/0.3ML ~~LOC~~ SOLN
30.0000 mg | SUBCUTANEOUS | Status: DC
  Administered 2015-06-24 – 2015-06-25 (×2): 30 mg via SUBCUTANEOUS
  Filled 2015-06-23 (×2): qty 0.3

## 2015-06-23 MED ORDER — MILRINONE LACTATE IN DEXTROSE 20-5 MG/100ML-% IV SOLN
0.2500 ug/kg/min | INTRAVENOUS | Status: DC
  Administered 2015-06-23 – 2015-06-27 (×6): 0.25 ug/kg/min via INTRAVENOUS
  Filled 2015-06-23 (×7): qty 100

## 2015-06-23 MED ORDER — FENTANYL CITRATE (PF) 100 MCG/2ML IJ SOLN
INTRAMUSCULAR | Status: DC | PRN
  Administered 2015-06-23: 25 ug via INTRAVENOUS

## 2015-06-23 MED ORDER — VERAPAMIL HCL 2.5 MG/ML IV SOLN
INTRAVENOUS | Status: AC
  Filled 2015-06-23: qty 2

## 2015-06-23 MED ORDER — SODIUM CHLORIDE 0.9 % IV SOLN: 250.0000 mL | INTRAVENOUS | Status: DC | PRN

## 2015-06-23 MED ORDER — HEPARIN (PORCINE) IN NACL 2-0.9 UNIT/ML-% IJ SOLN
INTRAMUSCULAR | Status: DC | PRN
  Administered 2015-06-23: 1000 mL
  Administered 2015-06-23: 500 mL

## 2015-06-23 MED ORDER — SODIUM CHLORIDE 0.9% FLUSH: 3.0000 mL | Freq: Two times a day (BID) | INTRAVENOUS | Status: DC

## 2015-06-23 MED ORDER — ONDANSETRON HCL 4 MG/2ML IJ SOLN
4.0000 mg | Freq: Four times a day (QID) | INTRAMUSCULAR | Status: DC | PRN
  Administered 2015-06-23: 4 mg via INTRAVENOUS
  Filled 2015-06-23: qty 2

## 2015-06-23 MED ORDER — SODIUM CHLORIDE 0.9 % IV SOLN
INTRAVENOUS | Status: AC
  Administered 2015-06-23: 15:00:00 via INTRAVENOUS

## 2015-06-23 MED ORDER — MIDAZOLAM HCL 2 MG/2ML IJ SOLN
INTRAMUSCULAR | Status: DC | PRN
  Administered 2015-06-23: 1 mg via INTRAVENOUS

## 2015-06-23 MED ORDER — IOPAMIDOL (ISOVUE-370) INJECTION 76%
INTRAVENOUS | Status: AC
  Filled 2015-06-23: qty 100

## 2015-06-23 MED ORDER — ASPIRIN 81 MG PO CHEW
81.0000 mg | CHEWABLE_TABLET | ORAL | Status: AC
  Administered 2015-06-23: 81 mg via ORAL
  Filled 2015-06-23: qty 1

## 2015-06-23 MED ORDER — SODIUM CHLORIDE 0.9% FLUSH: 3.0000 mL | INTRAVENOUS | Status: DC | PRN

## 2015-06-23 MED ORDER — LIDOCAINE HCL (PF) 1 % IJ SOLN
INTRAMUSCULAR | Status: DC | PRN
  Administered 2015-06-23: 2 mL
  Administered 2015-06-23: 8 mL

## 2015-06-23 MED ORDER — SODIUM CHLORIDE 0.9% FLUSH
3.0000 mL | Freq: Two times a day (BID) | INTRAVENOUS | Status: DC
  Administered 2015-06-23 – 2015-06-29 (×10): 3 mL via INTRAVENOUS

## 2015-06-23 MED ORDER — HEPARIN SODIUM (PORCINE) 1000 UNIT/ML IJ SOLN
INTRAMUSCULAR | Status: AC
  Filled 2015-06-23: qty 1

## 2015-06-23 MED ORDER — ACETAMINOPHEN 325 MG PO TABS: 650.0000 mg | ORAL_TABLET | ORAL | Status: DC | PRN

## 2015-06-23 MED ORDER — VERAPAMIL HCL 2.5 MG/ML IV SOLN
INTRAVENOUS | Status: DC | PRN
  Administered 2015-06-23: 10 mL via INTRA_ARTERIAL

## 2015-06-23 MED ORDER — SODIUM CHLORIDE 0.9 % IV SOLN: INTRAVENOUS | Status: DC

## 2015-06-23 MED ORDER — IOPAMIDOL (ISOVUE-370) INJECTION 76%
INTRAVENOUS | Status: DC | PRN
  Administered 2015-06-23: 40 mL via INTRA_ARTERIAL

## 2015-06-23 MED ORDER — HEPARIN SODIUM (PORCINE) 1000 UNIT/ML IJ SOLN
INTRAMUSCULAR | Status: DC | PRN
  Administered 2015-06-23: 3000 [IU] via INTRAVENOUS

## 2015-06-23 MED ORDER — LIDOCAINE HCL (PF) 1 % IJ SOLN
INTRAMUSCULAR | Status: AC
  Filled 2015-06-23: qty 30

## 2015-06-23 MED ORDER — POTASSIUM CHLORIDE CRYS ER 20 MEQ PO TBCR
40.0000 meq | EXTENDED_RELEASE_TABLET | Freq: Once | ORAL | Status: AC
  Administered 2015-06-23: 40 meq via ORAL
  Filled 2015-06-23: qty 2

## 2015-06-23 MED ORDER — SODIUM CHLORIDE 0.9 % IV SOLN
250.0000 mL | INTRAVENOUS | Status: DC | PRN
  Administered 2015-06-26: 250 mL via INTRAVENOUS

## 2015-06-23 MED ORDER — FENTANYL CITRATE (PF) 100 MCG/2ML IJ SOLN
INTRAMUSCULAR | Status: AC
  Filled 2015-06-23: qty 2

## 2015-06-23 MED ORDER — MIDAZOLAM HCL 2 MG/2ML IJ SOLN
INTRAMUSCULAR | Status: AC
  Filled 2015-06-23: qty 2

## 2015-06-23 SURGICAL SUPPLY — 14 items
CATH INFINITI 5 FR JL3.5 (CATHETERS) ×3 IMPLANT
CATH INFINITI JR4 5F (CATHETERS) ×3 IMPLANT
CATH SWAN GANZ 7F STRAIGHT (CATHETERS) ×3 IMPLANT
DEVICE RAD COMP TR BAND LRG (VASCULAR PRODUCTS) ×3 IMPLANT
GLIDESHEATH SLEND SS 6F .021 (SHEATH) ×3 IMPLANT
KIT HEART LEFT (KITS) ×3 IMPLANT
KIT HEART RIGHT NAMIC (KITS) ×3 IMPLANT
PACK CARDIAC CATHETERIZATION (CUSTOM PROCEDURE TRAY) ×3 IMPLANT
SHEATH PINNACLE 7F 10CM (SHEATH) ×3 IMPLANT
SLEEVE REPOSITIONING LENGTH 30 (MISCELLANEOUS) ×3 IMPLANT
TRANSDUCER W/STOPCOCK (MISCELLANEOUS) ×6 IMPLANT
TUBING CIL FLEX 10 FLL-RA (TUBING) ×3 IMPLANT
WIRE HI TORQ VERSACORE-J 145CM (WIRE) ×3 IMPLANT
WIRE SAFE-T 1.5MM-J .035X260CM (WIRE) ×3 IMPLANT

## 2015-06-23 NOTE — Telephone Encounter (Signed)
LMOVM reminding pt to send remote transmission.   

## 2015-06-23 NOTE — H&P (View-Only) (Signed)
Advanced Heart Failure Rounding Note  PCP: Dr Laney Pastor Primary Cardiologist: Dr Haroldine Laws  Subjective:    [name] is a 50 year old male with history of CAD, ICM, MI X4 (presents with chest burning radiating to L shoulder) with 17 stents, chronic systolic HF with EF [id number] s/p St. Jude ICD, HTN, gout, and hyperlipidemia. He just relocated to [facility] from [place]. His medical care was provided by [facility]. He has struggled with in-stent restenosis. Most recent heart cath was February 2016 with stent placed  Admitted May 8th through May 12th, 2017 with chest pain. Had LHC as noted below no intervention.   Admitted overnight with worsening SOB accompanied by nausea and diaphoresis. SBP up to 190. Started on nitro drip.  BP improved to 150s on last check.  He denies any recent fever or chills, though Tmax 100.7 in ED. Blood Cultures sent. Pertinent admission labs include K 3.2, Creatinine 1.77, BNP > 4500, initial troponin 0.04.   He denies current chest pain now on nitro gtt. Was his USOH until last night around 8-9 pm. Denies missing any medications, or dietary/fluid non-compliance. Bloods pressures in 120-130s.  + orthopnea and PND.  He denies lightheadedness or dizziness. Breathing has improved.  Has had cough with some white sputum, scant blood.   [facility] 05/27/15  Dist RCA-1 lesion, 100% stenosed. The lesion was previously treated with a stent (unknown type).  Dist RCA-2 lesion, 100% stenosed. The lesion was previously treated with a stent (unknown type).  Prox RCA lesion, 60% stenosed. The lesion was previously treated with a stent (unknown type) and angioplasty.  Mid RCA lesion, 40% stenosed. The lesion was previously treated with a stent (unknown type). Findings: RA = 1 RV = 22/1/5 PA = 27/13 (19) PCW = 6 Ao = 120/78 (96) LV = 124/4/13 Fick cardiac output/index = 3.3/1.9 PVR = 4.0 WU SVR = 2335  FA sat = 94% PA sat = 60%. 60%  Objective:   Weight  Range:   There is no weight on file to calculate BMI.   Vital Signs:   Temp:  [99.1 F (37.3 C)-100.7 F (38.2 C)] 100.7 F (38.2 C) (06/06 0645) Pulse Rate:  [94-121] 112 (06/06 0745) Resp:  [16-57] 30 (06/06 0645) BP: (151-190)/(99-152) 151/99 mmHg (06/06 0745) SpO2:  [89 %-99 %] 94 % (06/06 0745)    Weight change: There were no vitals filed for this visit.  Intake/Output:  No intake or output data in the 24 hours ending 06/22/15 0803   Physical Exam: General: Fatigued appearing, Mildly tachypneic.  HEENT: normal Neck: supple. JVD 8-9. Carotids 2+ bilat; no bruits. No thyromegaly or nodule noted.  Cor: PMI laterally displaced. Regular rate & rhythm. No rubs, gallops or murmurs. Lungs: Mildly tachypneic. Basilar crackles bilaterally, slightly diminished Abdomen: soft, NT, ND, no HSM. No bruits or masses. +BS  Extremities: no cyanosis, clubbing, rash, edema Neuro: alert & oriented x 3, cranial nerves grossly intact. moves all 4 extremities w/o difficulty. Affect pleasant.  Telemetry: Reviewed, Sinus Tach 110s  Labs: CBC  Recent Labs  06/21/15 2323 06/22/15 0615  WBC 6.8 7.7  NEUTROABS 5.2  --   HGB 12.9* 12.3*  HCT 39.1 38.0*  MCV 101.0* 101.1*  PLT 149* 123456*   Basic Metabolic Panel  Recent Labs  06/21/15 2323  NA 142  K 3.2*  CL 114*  CO2 22  GLUCOSE 105*  BUN 21*  CREATININE 1.77*  CALCIUM 8.7*   Liver Function Tests  Recent Labs  06/21/15 2323  AST 37  ALT 36  ALKPHOS 62  BILITOT 0.9  PROT 6.9  ALBUMIN 3.4*   No results for input(s): LIPASE, AMYLASE in the last 72 hours. Cardiac Enzymes  Recent Labs  06/21/15 2323  TROPONINI 0.04*    BNP: BNP (last 3 results)  Recent Labs  05/24/15 1159 06/21/15 2323  BNP >4500.0* >4500.0*    ProBNP (last 3 results) No results for input(s): PROBNP in the last 8760 hours.   D-Dimer No results for input(s): DDIMER in the last 72 hours. Hemoglobin A1C No results for input(s): HGBA1C  in the last 72 hours. Fasting Lipid Panel No results for input(s): CHOL, HDL, LDLCALC, TRIG, CHOLHDL, LDLDIRECT in the last 72 hours. Thyroid Function Tests No results for input(s): TSH, T4TOTAL, T3FREE, THYROIDAB in the last 72 hours.  Invalid input(s): FREET3  Other results:     Imaging/Studies:  Dg Chest Port 1 View  06/21/2015  CLINICAL DATA:  Chest pain EXAM: PORTABLE CHEST 1 VIEW COMPARISON:  05/24/2015 FINDINGS: Cardiomegaly, chronic. Stable aortic and hilar contours. Coronary stenting noted in the bilateral circulation. Dual-chamber ICD/ pacer with unchanged lead alignment. No failure or pneumonia. No effusion or pneumothorax. IMPRESSION: Cardiomegaly without failure. Electronically Signed   By: Monte Fantasia M.D.   On: 06/21/2015 23:34    Latest Echo  Latest Cath   Medications:     Scheduled Medications: . potassium chloride  40 mEq Oral Q4H  . ticagrelor  90 mg Oral BID    Infusions: . heparin 900 Units/hr (06/22/15 0743)    PRN Medications:     Assessment   1. Acute on chronic systolic CHF 2. CAD - MI x 4. Stents x 17.  Most recent [facility] 05/2015 - no intervention continued medical management.   Dist RCA-1 lesion, 100% stenosed. The lesion was previously treated with a stent (unknown type).  Dist RCA-2 lesion, 100% stenosed. The lesion was previously treated with a stent (unknown type).  Prox RCA lesion, 60% stenosed. The lesion was previously treated with a stent (unknown type) and angioplasty. Mid RCA lesion, 40% stenosed. The lesion was previously treated with a stent (unknown type). 3. HTN 4. CKD stage III  Plan    He appears to have at least mild volume overload and BNP > 4500. Will order IV lasix 80 mg now and re-assess.  Supp K.   Continue nitro drip for now.  Goal SBP < 150 for now.  Will titrated meds as tolerated.  Increase hydralazine to 75 mg TID.   Creatinine up slightly. Will follow closely with diuresis.   Of note, his RCA was  60% re-stenosed s/p stenting in 03/31/15 on cath during last admission.   He is currently chest pain free. Will need to follow closely. Continue to cycle troponins. Will likely need repeat LHC to further investigate stenosis of RCA. Will discuss timing with MD.   Length of Stay: 0   Satira Mccallum [facility] PA-C 06/22/2015, 8:03 AM  Advanced Heart Failure Team Pager [phone] (M-F; 7a - 4p)  Please contact [facility] for night-coverage after hours (4p -7a ) and weekends on [email or web address]   Patient seen and examined with Oda Kilts, PA-C. We discussed all aspects of the encounter. I agree with the assessment and plan as stated above. H&P from earlier this am by Dr. Claiborne Billings also reviewed and agree.   He presents with recurrent [place] with HTN crisis and A/C systolic HF. Troponin currently negative. Improved with NTG gtt  and diuresis but remains hypertensive and tachycardic. I am concerned he may be nearing the point where advanced therapies may be better option for him. Will diurese and control BP today. Cycle troponin. Likely plan repeat cath tomorrow with FFR of RCA. Watch fever curve. Check BCX and UA. (Fever could be related to ACS as no signs focal infection).   Dawood Spitler,MD 8:41 AM

## 2015-06-23 NOTE — Progress Notes (Signed)
Advanced Heart Failure Rounding Note  PCP: Dr Laney Pastor Primary Cardiologist: Dr Haroldine Laws  Subjective:    Admitted 06/22/15 with worsening SOB accompanied by nausea and diaphoresis. SBP up to 190. Started on nitro drip.    No further fevers. UA negative. BCx pending.  Troponins flat 0.6 -> 0.6 -> 0.5  Feels great this am. No further CP.  Denies lightheadedness or dizziness. No HAs.   Out 2.4 L and down 3 lbs with dose of IV lasix yesterday. Creatinine stable to improved. K 3.3  R/LHC 05/27/15  Dist RCA-1 lesion, 100% stenosed. The lesion was previously treated with a stent (unknown type).  Dist RCA-2 lesion, 100% stenosed. The lesion was previously treated with a stent (unknown type).  Prox RCA lesion, 60% stenosed. The lesion was previously treated with a stent (unknown type) and angioplasty.  Mid RCA lesion, 40% stenosed. The lesion was previously treated with a stent (unknown type). Findings: RA = 1 RV = 22/1/5 PA = 27/13 (19) PCW = 6 Ao = 120/78 (96) LV = 124/4/13 Fick cardiac output/index = 3.3/1.9 PVR = 4.0 WU SVR = 2335  FA sat = 94% PA sat = 60%. 60%  Objective:   Weight Range: 137 lb 12.8 oz (62.506 kg) Body mass index is 21.58 kg/(m^2).   Vital Signs:   Temp:  [97.8 F (36.6 C)-98.6 F (37 C)] 98.6 F (37 C) (06/07 0448) Pulse Rate:  [84-108] 84 (06/07 0448) Resp:  [24-47] 28 (06/07 0800) BP: (116-170)/(79-126) 147/97 mmHg (06/07 0800) SpO2:  [97 %-100 %] 98 % (06/07 0448) Weight:  [137 lb 12.8 oz (62.506 kg)-140 lb 8 oz (63.73 kg)] 137 lb 12.8 oz (62.506 kg) (06/07 0448)    Weight change: Filed Weights   06/22/15 1534 06/23/15 0448  Weight: 140 lb 8 oz (63.73 kg) 137 lb 12.8 oz (62.506 kg)    Intake/Output:   Intake/Output Summary (Last 24 hours) at 06/23/15 0849 Last data filed at 06/23/15 0800  Gross per 24 hour  Intake 494.75 ml  Output   3450 ml  Net -2955.25 ml     Physical Exam: General: Well appearing HEENT:  normal Neck: supple. JVD 6-7 . Carotids 2+ bilat; no bruits. No thyromegaly or nodule noted.  Cor: PMI laterally displaced. RRR. No M/G/R apprecaited Lungs: Slight basilar crackles.  Abdomen: soft, NT, ND, no HSM. No bruits or masses. +BS  Extremities: no cyanosis, clubbing, rash, edema Neuro: alert & oriented x 3, cranial nerves grossly intact. moves all 4 extremities w/o difficulty. Affect pleasant.  Telemetry: Reviewed, Sinus 90s  Labs: CBC  Recent Labs  06/21/15 2323  06/22/15 1609 06/23/15 0205  WBC 6.8  < > 7.1 6.2  NEUTROABS 5.2  --   --   --   HGB 12.9*  < > 13.2 13.1  HCT 39.1  < > 39.9 39.7  MCV 101.0*  < > 100.8* 100.0  PLT 149*  < > 141* 146*  < > = values in this interval not displayed. Basic Metabolic Panel  Recent Labs  06/22/15 1609 06/23/15 0202  NA 142 138  K 4.1 3.3*  CL 109 106  CO2 22 22  GLUCOSE 99 102*  BUN 25* 25*  CREATININE 1.87* 1.72*  CALCIUM 9.5 9.1   Liver Function Tests  Recent Labs  06/21/15 2323 06/22/15 1609  AST 37 29  ALT 36 34  ALKPHOS 62 69  BILITOT 0.9 1.1  PROT 6.9 7.4  ALBUMIN 3.4* 3.7   No results  for input(s): LIPASE, AMYLASE in the last 72 hours. Cardiac Enzymes  Recent Labs  06/22/15 1451 06/22/15 1945 06/23/15 0202  TROPONINI 0.06* 0.06* 0.05*    BNP: BNP (last 3 results)  Recent Labs  05/24/15 1159 06/21/15 2323  BNP >4500.0* >4500.0*    ProBNP (last 3 results) No results for input(s): PROBNP in the last 8760 hours.   D-Dimer No results for input(s): DDIMER in the last 72 hours. Hemoglobin A1C  Recent Labs  06/22/15 1609  HGBA1C 5.1   Fasting Lipid Panel  Recent Labs  06/22/15 1609  CHOL 126  HDL 55  LDLCALC 29  TRIG 208*  CHOLHDL 2.3   Thyroid Function Tests  Recent Labs  06/22/15 1609  TSH 0.330*    Other results:     Imaging/Studies:  Dg Chest Port 1 View  06/21/2015  CLINICAL DATA:  Chest pain EXAM: PORTABLE CHEST 1 VIEW COMPARISON:  05/24/2015 FINDINGS:  Cardiomegaly, chronic. Stable aortic and hilar contours. Coronary stenting noted in the bilateral circulation. Dual-chamber ICD/ pacer with unchanged lead alignment. No failure or pneumonia. No effusion or pneumothorax. IMPRESSION: Cardiomegaly without failure. Electronically Signed   By: Monte Fantasia M.D.   On: 06/21/2015 23:34    Latest Echo  Latest Cath   Medications:     Scheduled Medications: . amLODipine  10 mg Oral Daily  . aspirin EC  81 mg Oral Daily  . calcitRIOL  0.25 mcg Oral QODAY  . carvedilol  25 mg Oral BID WC  . cholecalciferol  1,000 Units Oral Daily  . ezetimibe  10 mg Oral Daily  . hydrALAZINE  75 mg Oral TID  . isosorbide dinitrate  20 mg Oral TID  . omega-3 acid ethyl esters  1 g Oral Daily  . rosuvastatin  40 mg Oral Daily  . sacubitril-valsartan  1 tablet Oral BID  . sodium chloride flush  3 mL Intravenous Q12H  . sodium chloride flush  3 mL Intravenous Q12H  . spironolactone  25 mg Oral Daily  . ticagrelor  90 mg Oral BID    Infusions: . sodium chloride    . heparin 1,050 Units/hr (06/23/15 0600)  . nitroGLYCERIN      PRN Medications:     Assessment   1. Acute on chronic systolic CHF 2. CAD - MI x 4. Stents x 17.  Most recent [facility] 05/2015 - no intervention continued medical management.   Dist RCA-1 lesion, 100% stenosed. The lesion was previously treated with a stent (unknown type).  Dist RCA-2 lesion, 100% stenosed. The lesion was previously treated with a stent (unknown type).  Prox RCA lesion, 60% stenosed. The lesion was previously treated with a stent (unknown type) and angioplasty. Mid RCA lesion, 40% stenosed. The lesion was previously treated with a stent (unknown type). 3. HTN 4. CKD stage III  Plan    Creatinine improved with diuresis.  Supp K.  No further lasix pending cath this pm.   Wean nitro drip as able. At least keep SBP < 150. Continue hydralazine 75 mg TID, can titrate as needed. Continue isordil 20 mg TID.    No further fever.  UA negative. BCx pending.   Of note, his RCA was 60% re-stenosed s/p stenting in 03/31/15 on cath during last admission.  Plan for repeat cath today with FFR of RCA.   He is currently chest pain free. Will need to follow closely.   His symptoms may be more related to CHF than CAD.  May be nearing  consideration for advanced therapies.   Length of Stay: 1   Shirley Friar PA-C 06/23/2015, 8:49 AM  Advanced Heart Failure Team Pager [phone] (M-F; 7a - 4p)  Please contact [facility] for night-coverage after hours (4p -7a ) and weekends on [email or web address]  Patient seen and examined with Oda Kilts, PA-C. We discussed all aspects of the encounter. I agree with the assessment and plan as stated above.   CP resolved. troponins remain negative. BP improving. I have reviewed previous coronary angiograms with interventional team. I suspect most of his symptoms are related to advanced HF and may be best to start thinking about VAD therapy prior to his creatinine getting much worse. Plan R/L cath today with limited contrast.   Bensimhon, Daniel,MD 1:25 PM

## 2015-06-23 NOTE — Progress Notes (Signed)
ANTICOAGULATION CONSULT NOTE - Follow Up Consult  Pharmacy Consult for heparin Indication: chest pain/ACS  No Known Allergies  Patient Measurements: Height: 5\' 7"  (170.2 cm) Weight: 137 lb 12.8 oz (62.506 kg) IBW/kg (Calculated) : 66.1 Heparin Dosing Weight: 66.3 kg  Vital Signs: Temp: 98.6 F (37 C) (06/07 0448) Temp Source: Oral (06/07 0448) BP: 152/93 mmHg (06/07 0900) Pulse Rate: 84 (06/07 0448)  Labs:  Recent Labs  06/21/15 2323  06/22/15 0615 06/22/15 1337 06/22/15 1451 06/22/15 1609 06/22/15 1945 06/23/15 0202 06/23/15 0205  HGB 12.9*  --  12.3*  --   --  13.2  --   --  13.1  HCT 39.1  --  38.0*  --   --  39.9  --   --  39.7  PLT 149*  --  149*  --   --  141*  --   --  146*  LABPROT  --   --   --   --   --   --   --  15.3*  --   INR  --   --   --   --   --   --   --  1.19  --   HEPARINUNFRC  --   < > 0.26* 0.38  --   --  0.28*  --  0.33  CREATININE 1.77*  --   --   --   --  1.87*  --  1.72*  --   TROPONINI 0.04*  --   --   --  0.06*  --  0.06* 0.05*  --   < > = values in this interval not displayed.  Estimated Creatinine Clearance: 45.4 mL/min (by C-G formula based on Cr of 1.72).   Medications:  Infusions:  . sodium chloride    . heparin 1,050 Units/hr (06/23/15 0600)  . nitroGLYCERIN      Assessment: 50 yo male on IV heparin for r/o ACS with therapeutic heparin level on heparin at 900 units/hr.  No bleeding or complications noted. CBC stable.  Heparin level at goal on 1050 units/hr.  Goal of Therapy:  Heparin level 0.3-0.7 units/ml Monitor platelets by anticoagulation protocol: Yes   Plan:  1. Continue IV heparin at current rate. 2. F/u plans for anticoagulation after cath today.   Uvaldo Rising, BCPS  Clinical Pharmacist Pager [phone]  06/23/2015 1:28 PM

## 2015-06-23 NOTE — Interval H&P Note (Signed)
History and Physical Interval Note:  06/23/2015 1:22 PM  Marvin Martin  has presented today for surgery, with the diagnosis of cp  The various methods of treatment have been discussed with the patient and family. After consideration of risks, benefits and other options for treatment, the patient has consented to  Procedure(s): Right/Left Heart Cath and Coronary Angiography (N/A) and possible angioplasty as a surgical intervention .  The patient's history has been reviewed, patient examined, no change in status, stable for surgery.  I have reviewed the patient's chart and labs.  Questions were answered to the patient's satisfaction.     Dyrell Tuccillo, Quillian Quince

## 2015-06-23 NOTE — Care Management Note (Signed)
Case Management Note  Patient Details  Name: Marvin Martin MRN: [id number] Date of Birth: 20-Feb-1965  Subjective/Objective:     Adm w angina, to start milrinone, has swanz               Action/Plan: lives alone, pcp dr Jarold Song   Expected Discharge Date:                  Expected Discharge Plan:  [place]  In-House Referral:     Discharge planning Services     Post Acute Care Choice:    Choice offered to:     DME Arranged:    DME Agency:     HH Arranged:    [place] Agency:     Status of Service:     Medicare Important Message Given:    Date Medicare IM Given:    Medicare IM give by:    Date Additional Medicare IM Given:    Additional Medicare Important Message give by:     If discussed at [place] of Stay Meetings, dates discussed:    Additional Comments: will moniter for dc needs as pt progresses.  Lacretia Leigh, RN 06/23/2015, 2:33 PM

## 2015-06-24 ENCOUNTER — Ambulatory Visit: Payer: Medicare Other | Admitting: Family Medicine

## 2015-06-24 DIAGNOSIS — I25118 Atherosclerotic heart disease of native coronary artery with other forms of angina pectoris: Secondary | ICD-10-CM

## 2015-06-24 DIAGNOSIS — N179 Acute kidney failure, unspecified: Secondary | ICD-10-CM

## 2015-06-24 DIAGNOSIS — R570 Cardiogenic shock: Secondary | ICD-10-CM

## 2015-06-24 LAB — CBC
HCT: 39.4 % (ref 39.0–52.0)
HEMOGLOBIN: 13.4 g/dL (ref 13.0–17.0)
MCH: 33.8 pg (ref 26.0–34.0)
MCHC: 34 g/dL (ref 30.0–36.0)
MCV: 99.2 fL (ref 78.0–100.0)
PLATELETS: 173 10*3/uL (ref 150–400)
RBC: 3.97 MIL/uL — ABNORMAL LOW (ref 4.22–5.81)
RDW: 12.9 % (ref 11.5–15.5)
WBC: 6.1 10*3/uL (ref 4.0–10.5)

## 2015-06-24 LAB — CARBOXYHEMOGLOBIN
Carboxyhemoglobin: 1.4 % (ref 0.5–1.5)
Methemoglobin: 0.7 % (ref 0.0–1.5)
O2 SAT: 65.7 %
TOTAL HEMOGLOBIN: 14 g/dL (ref 13.5–18.0)

## 2015-06-24 LAB — BASIC METABOLIC PANEL
Anion gap: 8 (ref 5–15)
BUN: 35 mg/dL — AB (ref 6–20)
CHLORIDE: 106 mmol/L (ref 101–111)
CO2: 24 mmol/L (ref 22–32)
CREATININE: 2.34 mg/dL — AB (ref 0.61–1.24)
Calcium: 9.1 mg/dL (ref 8.9–10.3)
GFR calc Af Amer: 36 mL/min — ABNORMAL LOW (ref >60)
GFR calc non Af Amer: 31 mL/min — ABNORMAL LOW (ref >60)
GLUCOSE: 130 mg/dL — AB (ref 65–99)
POTASSIUM: 3.7 mmol/L (ref 3.5–5.1)
SODIUM: 138 mmol/L (ref 135–145)

## 2015-06-24 LAB — ANTITHROMBIN III: ANTITHROMB III FUNC: 86 % (ref 75–120)

## 2015-06-24 MED ORDER — SODIUM CHLORIDE 0.9 % IV BOLUS (SEPSIS)
250.0000 mL | Freq: Once | INTRAVENOUS | Status: AC
  Administered 2015-06-24: 250 mL via INTRAVENOUS

## 2015-06-24 MED ORDER — SODIUM CHLORIDE 0.9% FLUSH: 10.0000 mL | INTRAVENOUS | Status: DC | PRN

## 2015-06-24 MED ORDER — SODIUM CHLORIDE 0.9% FLUSH
10.0000 mL | Freq: Two times a day (BID) | INTRAVENOUS | Status: DC
  Administered 2015-06-24 – 2015-06-25 (×2): 10 mL

## 2015-06-24 MED ORDER — SODIUM CHLORIDE 0.9 % IV SOLN
INTRAVENOUS | Status: AC
  Administered 2015-06-24: 09:00:00 via INTRAVENOUS

## 2015-06-24 NOTE — Progress Notes (Signed)
Advanced Heart Failure Rounding Note  PCP: Dr Laney Pastor Primary Cardiologist: Dr Haroldine Laws  Subjective:    Admitted 06/22/15 with worsening SOB accompanied by nausea and diaphoresis. SBP up to 190. Started on nitro drip.    Yesterday he was started on milrinone 0.25 mcg after RHC/LHC. Todays CO-OX 66%. Over night hypotensive. Creatinine worse today 1.7>2.3 . Feels ok Denies SOB/ CP.   Swan numbers this am:  CVP 3  PA 23/17 PCWP 7 Co-ox: 66%   R&L heart cath 06/23/15 : Stable CAD with borderline significant lesions in midLAD and mid RCA but not felt to be critical/  Findings: Ao = 87/62 (71) LV = 81/9 RA = 2 RV = 20/0/2 PA = 24/12 (16) PCW = 4 Fick cardiac output/index = 3.1/1.8 Thermo CO/CI = 3.0/1.7 PVR = 3.9 WU Ao sat = 97% PA sat = 54%, 55%   Objective:   Weight Range: 139 lb 1.8 oz (63.1 kg) Body mass index is 21.78 kg/(m^2).   Vital Signs:   Temp:  [97.3 F (36.3 C)-98.4 F (36.9 C)] 97.9 F (36.6 C) (06/08 0628) Pulse Rate:  [44-99] 48 (06/08 0628) Resp:  [0-44] 15 (06/08 0628) BP: (79-152)/(53-103) 112/83 mmHg (06/08 0628) SpO2:  [91 %-100 %] 97 % (06/08 0628) Weight:  [139 lb 1.8 oz (63.1 kg)] 139 lb 1.8 oz (63.1 kg) (06/08 0422) Last BM Date: 06/22/15  Weight change: Filed Weights   06/22/15 1534 06/23/15 0448 06/24/15 0422  Weight: 140 lb 8 oz (63.73 kg) 137 lb 12.8 oz (62.506 kg) 139 lb 1.8 oz (63.1 kg)    Intake/Output:   Intake/Output Summary (Last 24 hours) at 06/24/15 0807 Last data filed at 06/24/15 0630  Gross per 24 hour  Intake 801.07 ml  Output    500 ml  Net 301.07 ml     Physical Exam: CVP 3 General: Well appearing. In bed.  HEENT: normal Neck: supple. RIJ swan  Carotids 2+ bilat; no bruits. No thyromegaly or nodule noted. RIJ. Swan.  Cor: PMI laterally displaced. RRR. No M/G/R apprecaited Lungs: Decreased in the bases.   Abdomen: soft, NT, ND, no HSM. No bruits or masses. +BS  Extremities: no cyanosis, clubbing,  rash, edema Neuro: alert & oriented x 3, cranial nerves grossly intact. moves all 4 extremities w/o difficulty. Affect pleasant.  Telemetry: Reviewed, Sinus 90s  Labs: CBC  Recent Labs  06/21/15 2323  06/23/15 0205 06/24/15 0440  WBC 6.8  < > 6.2 6.1  NEUTROABS 5.2  --   --   --   HGB 12.9*  < > 13.1 13.4  HCT 39.1  < > 39.7 39.4  MCV 101.0*  < > 100.0 99.2  PLT 149*  < > 146* 173  < > = values in this interval not displayed. Basic Metabolic Panel  Recent Labs  06/23/15 0202 06/24/15 0440  NA 138 138  K 3.3* 3.7  CL 106 106  CO2 22 24  GLUCOSE 102* 130*  BUN 25* 35*  CREATININE 1.72* 2.34*  CALCIUM 9.1 9.1   Liver Function Tests  Recent Labs  06/21/15 2323 06/22/15 1609  AST 37 29  ALT 36 34  ALKPHOS 62 69  BILITOT 0.9 1.1  PROT 6.9 7.4  ALBUMIN 3.4* 3.7   No results for input(s): LIPASE, AMYLASE in the last 72 hours. Cardiac Enzymes  Recent Labs  06/22/15 1451 06/22/15 1945 06/23/15 0202  TROPONINI 0.06* 0.06* 0.05*    BNP: BNP (last 3 results)  Recent Labs  05/24/15 1159 06/21/15 2323  BNP >4500.0* >4500.0*    ProBNP (last 3 results) No results for input(s): PROBNP in the last 8760 hours.   D-Dimer No results for input(s): DDIMER in the last 72 hours. Hemoglobin A1C  Recent Labs  06/22/15 1609  HGBA1C 5.1   Fasting Lipid Panel  Recent Labs  06/22/15 1609  CHOL 126  HDL 55  LDLCALC 29  TRIG 208*  CHOLHDL 2.3   Thyroid Function Tests  Recent Labs  06/22/15 1609  TSH 0.330*    Other results:     Imaging/Studies:  No results found.  Latest Echo  Latest Cath   Medications:     Scheduled Medications: . amLODipine  10 mg Oral Daily  . aspirin EC  81 mg Oral Daily  . calcitRIOL  0.25 mcg Oral QODAY  . carvedilol  25 mg Oral BID WC  . cholecalciferol  1,000 Units Oral Daily  . enoxaparin (LOVENOX) injection  30 mg Subcutaneous Q24H  . ezetimibe  10 mg Oral Daily  . hydrALAZINE  75 mg Oral TID  .  isosorbide dinitrate  20 mg Oral TID  . omega-3 acid ethyl esters  1 g Oral Daily  . rosuvastatin  40 mg Oral Daily  . sacubitril-valsartan  1 tablet Oral BID  . sodium chloride flush  3 mL Intravenous Q12H  . sodium chloride flush  3 mL Intravenous Q12H  . spironolactone  25 mg Oral Daily  . ticagrelor  90 mg Oral BID    Infusions: . milrinone 0.25 mcg/kg/min (06/24/15 0630)    PRN Medications:     Assessment   1. Acute on chronic systolic CHF -> cardiogenic shock 2. CAD - MI x 4. Stents x 17.   Dist RCA-1 lesion, 100% stenosed. The lesion was previously treated with a stent (unknown type).  Dist RCA-2 lesion, 100% stenosed. The lesion was previously treated with a stent (unknown type).  Prox RCA lesion, 70% stenosed. The lesion was previously treated with a stent (unknown type) and angioplasty.  Mid RCA lesion, 50% stenosed. The lesion was previously treated with a stent (unknown type).  Ost RPDA to RPDA lesion, 75% stenosed. The lesion was previously treated with a drug-eluting stent.  Mid LAD lesion, 70% stenosed.  Ost 2nd Diag lesion, 70% stenosed. 3. Hypertensive crisis 4. Acute on CKD stage III 5. Chest pain   Plan   CVP 3. Creatinine trending up. May be from contrast. Give NS 50 cc per hour for 6 hours. Stop all BP meds for now. Continue milrinone 0.25 mcg.  No diuretics for now.   Follow renal function closely.    Length of Stay: 2   Amy Clegg NP-C  06/24/2015, 8:07 AM  Advanced Heart Failure Team Pager [phone] (M-F; 7a - 4p)  Please contact [facility] for night-coverage after hours (4p -7a ) and weekends on [email or web address]  Patient seen and examined with Darrick Grinder, NP. We discussed all aspects of the encounter. I agree with the assessment and plan as stated above.   He remains very tenuous. Suspect main issue is low cardiac output and not his CAD although CAD has progressed again. Will continue milrinone. Renal function worse may by CIN +/- ATN  from low BP (was very hypertensive on admit but BP dropped overnight).   Will hold all HF meds and diuretics. Hydrate gently.   Discussed possibility of VAD at length with him and he is open to it. VAD team ahs also seen him. Sister  is coming down from [place] today to discuss.   The patient is critically ill with multiple organ systems failure and requires high complexity decision making for assessment and support, frequent evaluation and titration of therapies, application of advanced monitoring technologies and extensive interpretation of multiple databases.   Critical Care Time devoted to patient care services described in this note is 35 Minutes.   Aleanna Menge,MD 12:07 PM

## 2015-06-24 NOTE — Progress Notes (Addendum)
Initial Encounter with LVAD Team and MCS Introduction:  Clinical course with heart failure:  Marvin Martin is a 50 year old patient of Dr. Haroldine Laws with history of CAD, ICM, MI X4 (presents with chest burning radiating to L shoulder) with 17 stents, chronic systolic HF with EF [id number] s/p St. Jude ICD, HTN, gout, and hyperlipidemia. He just relocated to [facility] from [facility] November 2016. His medical care was provided by [facility]. He told me he was being worked up for heart transplant prior to moving here. He has struggled with in-stent restenosis. Most recent heart cath was February 2016 with stent placed.   [place] 06/23/15: Ao = 87/62 (71) LV = 81/9 RA = 2 RV = 20/0/2 PA = 24/12 (16) PCW = 4 Fick cardiac output/index = 3.1/1.8 Thermo CO/CI = 3.0/1.7 PVR = 3.9 WU Ao sat = 97% PA sat = 54%, 55%  VAD Education: VAD educational packet including "HM II Patient Handbook", "HM II Left Ventricular Assist System" packet, and "[facility] HM II Patient Education" reviewed in detail with me and left at bedside for continued reference. Patient education DVD was given to her as well for reference should she want to see more about the equipment at home after reviewing the information I left her.   Explained that LVAD can be implanted for two indications in the setting of advanced left ventricular heart failure treatment:  1. Bridge to transplant - used for patients who cannot safely wait for heart transplant without this device.  Or   2. Destination therapy - used for patients until end of life or recovery of heart function.  Discussed that at this point the patient would be considered for Destination therapy with hope for candidacy down the road should the patient be deemed an acceptable VAD candidate.   Provided brief equipment overview of the HeartMate II pump and discussed placement, surgical procedure, peripheral equipment, life-long coumadin therapy, importance of medication adherence and  clinic follow up for as long as patient is living on support, life-style modifications, as well as need for caregiver to be successful with this therapy.   The patient was very pleasant during conversation. We discussed the process of the evaluation period and how a decision was made by the [facility] team whether she would be an appropriate candidate for therapy or not. Evaluation consent was reviewed, signed by the patient and left for reference. FU with he and his sister tomorrow as I am not entirely sure he understands the therapy based on his reaction.   Caregiver Support: undetermined. He lives in [place] alone and reports his sister "would be able to help"  Home Inspection Checklist: not reviewed currently  Advised the patient review the materials, contact either myself, Zada Girt or Tanda Rockers with questions and we will plan on meeting with her at next scheduled clinic appointment. Verbalized he/she would review the evaluation consent and make a decision regarding the evaluation process.   At this point after briefly reviewing her chart some points that would exclude the patient or make them extremely high risk to have LVAD surgery include:   Caregiver Support as he currently lives alone  CKD, III (GFR 30 - 59) 2.34 today      Session Time: 30 minutes  Marvin Madeira, RN [place] Coordinator   Office: [phone] 24/7 [place] Pager: [phone]

## 2015-06-25 ENCOUNTER — Inpatient Hospital Stay (HOSPITAL_COMMUNITY): Payer: Medicare Other

## 2015-06-25 ENCOUNTER — Telehealth: Payer: Self-pay | Admitting: Infectious Diseases

## 2015-06-25 ENCOUNTER — Encounter: Payer: Self-pay | Admitting: Cardiology

## 2015-06-25 DIAGNOSIS — Z01810 Encounter for preprocedural cardiovascular examination: Secondary | ICD-10-CM

## 2015-06-25 DIAGNOSIS — N183 Chronic kidney disease, stage 3 (moderate): Secondary | ICD-10-CM

## 2015-06-25 LAB — BLOOD GAS, ARTERIAL
ACID-BASE DEFICIT: 2.2 mmol/L — AB (ref 0.0–2.0)
Bicarbonate: 21.7 mEq/L (ref 20.0–24.0)
DRAWN BY: 398981
FIO2: 0.21
O2 Saturation: 96.8 %
PATIENT TEMPERATURE: 98.6
PCO2 ART: 34.7 mmHg — AB (ref 35.0–45.0)
PH ART: 7.412 (ref 7.350–7.450)
TCO2: 22.8 mmol/L (ref 0–100)
pO2, Arterial: 94.5 mmHg (ref 80.0–100.0)

## 2015-06-25 LAB — TYPE AND SCREEN
ABO/RH(D): O POS
Antibody Screen: NEGATIVE

## 2015-06-25 LAB — BASIC METABOLIC PANEL
ANION GAP: 8 (ref 5–15)
BUN: 28 mg/dL — ABNORMAL HIGH (ref 6–20)
CALCIUM: 9 mg/dL (ref 8.9–10.3)
CO2: 24 mmol/L (ref 22–32)
Chloride: 108 mmol/L (ref 101–111)
Creatinine, Ser: 1.89 mg/dL — ABNORMAL HIGH (ref 0.61–1.24)
GFR, EST AFRICAN AMERICAN: 46 mL/min — AB (ref >60)
GFR, EST NON AFRICAN AMERICAN: 40 mL/min — AB (ref >60)
Glucose, Bld: 104 mg/dL — ABNORMAL HIGH (ref 65–99)
POTASSIUM: 3.5 mmol/L (ref 3.5–5.1)
SODIUM: 140 mmol/L (ref 135–145)

## 2015-06-25 LAB — CBC
HEMATOCRIT: 41 % (ref 39.0–52.0)
HEMOGLOBIN: 13.7 g/dL (ref 13.0–17.0)
MCH: 33.2 pg (ref 26.0–34.0)
MCHC: 33.4 g/dL (ref 30.0–36.0)
MCV: 99.3 fL (ref 78.0–100.0)
Platelets: 150 10*3/uL (ref 150–400)
RBC: 4.13 MIL/uL — AB (ref 4.22–5.81)
RDW: 12.8 % (ref 11.5–15.5)
WBC: 6 10*3/uL (ref 4.0–10.5)

## 2015-06-25 LAB — HIV ANTIBODY (ROUTINE TESTING W REFLEX): HIV SCREEN 4TH GENERATION: NONREACTIVE

## 2015-06-25 LAB — CARBOXYHEMOGLOBIN
Carboxyhemoglobin: 1.6 % — ABNORMAL HIGH (ref 0.5–1.5)
METHEMOGLOBIN: 0.5 % (ref 0.0–1.5)
O2 Saturation: 62.6 %
Total hemoglobin: 14.2 g/dL (ref 13.5–18.0)

## 2015-06-25 LAB — T4, FREE: FREE T4: 1.01 ng/dL (ref 0.61–1.12)

## 2015-06-25 LAB — PSA: PSA: 0.46 ng/mL (ref 0.00–4.00)

## 2015-06-25 LAB — URIC ACID: Uric Acid, Serum: 6.5 mg/dL (ref 4.4–7.6)

## 2015-06-25 LAB — LACTATE DEHYDROGENASE: LDH: 206 U/L — AB (ref 98–192)

## 2015-06-25 LAB — PREALBUMIN: Prealbumin: 26 mg/dL (ref 18–38)

## 2015-06-25 MED ORDER — HYDRALAZINE HCL 50 MG PO TABS
50.0000 mg | ORAL_TABLET | Freq: Three times a day (TID) | ORAL | Status: DC
  Administered 2015-06-25 – 2015-06-26 (×4): 50 mg via ORAL
  Filled 2015-06-25 (×4): qty 1

## 2015-06-25 MED ORDER — SODIUM CHLORIDE 0.9% FLUSH
10.0000 mL | Freq: Two times a day (BID) | INTRAVENOUS | Status: DC
  Administered 2015-06-25 – 2015-06-29 (×8): 10 mL

## 2015-06-25 MED ORDER — ISOSORBIDE DINITRATE 10 MG PO TABS
20.0000 mg | ORAL_TABLET | Freq: Three times a day (TID) | ORAL | Status: DC
  Administered 2015-06-25 – 2015-06-27 (×7): 20 mg via ORAL
  Filled 2015-06-25 (×7): qty 2

## 2015-06-25 MED ORDER — ENOXAPARIN SODIUM 40 MG/0.4ML ~~LOC~~ SOLN
40.0000 mg | SUBCUTANEOUS | Status: DC
  Administered 2015-06-26 – 2015-06-30 (×5): 40 mg via SUBCUTANEOUS
  Filled 2015-06-25 (×5): qty 0.4

## 2015-06-25 MED ORDER — ENSURE ENLIVE PO LIQD
237.0000 mL | Freq: Three times a day (TID) | ORAL | Status: DC
  Administered 2015-06-25 – 2015-06-30 (×14): 237 mL via ORAL

## 2015-06-25 MED ORDER — POTASSIUM CHLORIDE CRYS ER 20 MEQ PO TBCR
40.0000 meq | EXTENDED_RELEASE_TABLET | Freq: Once | ORAL | Status: AC
  Administered 2015-06-25: 40 meq via ORAL
  Filled 2015-06-25: qty 2

## 2015-06-25 MED ORDER — SODIUM CHLORIDE 0.9% FLUSH: 10.0000 mL | INTRAVENOUS | Status: DC | PRN

## 2015-06-25 NOTE — Progress Notes (Signed)
[facility]  Patient Status:  New pt for [facility] this hospital admission  [facility] is providing the following services: HHRN and home inotrope pharmacy team if home milrinone is needed.  [facility] will follow to support ordered home care needs at DC.  If patient discharges after hours, please call [phone].   Larry Sierras 06/25/2015, 6:09 PM

## 2015-06-25 NOTE — Progress Notes (Signed)
Peripherally Inserted Central Catheter/Midline Placement  The IV Nurse has discussed with the patient and/or persons authorized to consent for the patient, the purpose of this procedure and the potential benefits and risks involved with this procedure.  The benefits include less needle sticks, lab draws from the catheter and patient may be discharged home with the catheter.  Risks include, but not limited to, infection, bleeding, blood clot (thrombus formation), and puncture of an artery; nerve damage and irregular heat beat.  Alternatives to this procedure were also discussed.  PICC/Midline Placement Documentation        Henderson [place] 06/25/2015, 1:47 PM Consent obtained by Carolee Rota, RN

## 2015-06-25 NOTE — Care Management Important Message (Signed)
Important Message  Patient Details  Name: Marvin Martin MRN: [id number] Date of Birth: 03-Jul-1965   Medicare Important Message Given:  Yes    Stephaine Breshears [place]ena 06/25/2015, 10:50 AM

## 2015-06-25 NOTE — Progress Notes (Signed)
Advanced Heart Failure Rounding Note  PCP: Dr Laney Pastor Primary Cardiologist: Dr Haroldine Laws  Subjective:    Admitted 06/22/15 with worsening SOB accompanied by nausea and diaphoresis. SBP up to 190. Started on nitro drip.    Post cath started on milrinone for low output. Received gently hydration yesterday due to elevated creatinine. Creatinine peaked 2.3 but today trending down 1.89. HF meds held.    Feels ok Denies SOB/ CP.   [place]wan numbers this am:  CVP 2 PA 24/10 PCWP 11 CI 2.3  Co-ox: 63%   R&L heart cath 06/23/15 : Stable CAD with borderline significant lesions in midLAD and mid RCA but not felt to be critical/  Findings: Ao = 87/62 (71) LV = 81/9 RA = 2 RV = 20/0/2 PA = 24/12 (16) PCW = 4 Fick cardiac output/index = 3.1/1.8 Thermo CO/CI = 3.0/1.7 PVR = 3.9 WU Ao sat = 97% PA sat = 54%, 55%   Objective:   Weight Range: 140 lb 3.4 oz (63.6 kg) Body mass index is 21.96 kg/(m^2).   Vital Signs:   Temp:  [97 F (36.1 C)-98.4 F (36.9 C)] 97.5 F (36.4 C) (06/09 0500) Pulse Rate:  [40-102] 49 (06/09 0500) Resp:  [0-30] 5 (06/09 0500) BP: (101-172)/(67-155) 155/122 mmHg (06/09 0500) SpO2:  [94 %-100 %] 97 % (06/09 0500) Weight:  [140 lb 3.4 oz (63.6 kg)] 140 lb 3.4 oz (63.6 kg) (06/09 0420) Last BM Date: 06/22/15  Weight change: Filed Weights   06/23/15 0448 06/24/15 0422 06/25/15 0420  Weight: 137 lb 12.8 oz (62.506 kg) 139 lb 1.8 oz (63.1 kg) 140 lb 3.4 oz (63.6 kg)    Intake/Output:   Intake/Output Summary (Last 24 hours) at 06/25/15 0717 Last data filed at 06/25/15 0600  Gross per 24 hour  Intake 1340.63 ml  Output   1425 ml  Net -84.37 ml     Physical Exam: CVP 1 General: Well appearing. In bed.  HEENT: normal Neck: supple. RIJ swan  Carotids 2+ bilat; no bruits. No thyromegaly or nodule noted. JVP flat.  Cor: PMI laterally displaced. RRR. No M/G/R apprecaited Lungs: Decreased in the bases.   Abdomen: soft, NT, ND, no HSM. No bruits or  masses. +BS  Extremities: no cyanosis, clubbing, rash, edema Neuro: alert & oriented x 3, cranial nerves grossly intact. moves all 4 extremities w/o difficulty. Affect pleasant.  Telemetry: Reviewed, Sinus 90s with PVCs.   Labs: CBC  Recent Labs  06/24/15 0440 06/25/15 0516  WBC 6.1 6.0  HGB 13.4 13.7  HCT 39.4 41.0  MCV 99.2 99.3  PLT 173 [id number]   Basic Metabolic Panel  Recent Labs  06/24/15 0440 06/25/15 0516  NA 138 140  K 3.7 3.5  CL 106 108  CO2 24 24  GLUCOSE 130* 104*  BUN 35* 28*  CREATININE 2.34* 1.89*  CALCIUM 9.1 9.0   Liver Function Tests  Recent Labs  06/22/15 1609  AST 29  ALT 34  ALKPHOS 69  BILITOT 1.1  PROT 7.4  ALBUMIN 3.7   No results for input(s): LIPASE, AMYLASE in the last 72 hours. Cardiac Enzymes  Recent Labs  06/22/15 1451 06/22/15 1945 06/23/15 0202  TROPONINI 0.06* 0.06* 0.05*    BNP: BNP (last 3 results)  Recent Labs  05/24/15 1159 06/21/15 2323  BNP >4500.0* >4500.0*    ProBNP (last 3 results) No results for input(s): PROBNP in the last 8760 hours.   D-Dimer No results for input(s): DDIMER in the last 72  hours. Hemoglobin A1C  Recent Labs  06/22/15 1609  HGBA1C 5.1   Fasting Lipid Panel  Recent Labs  06/22/15 1609  CHOL 126  HDL 55  LDLCALC 29  TRIG 208*  CHOLHDL 2.3   Thyroid Function Tests  Recent Labs  06/22/15 1609  TSH 0.330*    Other results:     Imaging/Studies:  No results found.  Latest Echo  Latest Cath   Medications:     Scheduled Medications: . aspirin EC  81 mg Oral Daily  . calcitRIOL  0.25 mcg Oral QODAY  . cholecalciferol  1,000 Units Oral Daily  . enoxaparin (LOVENOX) injection  30 mg Subcutaneous Q24H  . ezetimibe  10 mg Oral Daily  . omega-3 acid ethyl esters  1 g Oral Daily  . rosuvastatin  40 mg Oral Daily  . sodium chloride flush  10-40 mL Intracatheter Q12H  . sodium chloride flush  3 mL Intravenous Q12H  . sodium chloride flush  3 mL  Intravenous Q12H  . ticagrelor  90 mg Oral BID    Infusions: . milrinone 0.25 mcg/kg/min (06/25/15 0619)    PRN Medications:     Assessment   1. Acute on chronic systolic CHF -> cardiogenic shock 2. CAD - MI x 4. Stents x 17.   Dist RCA-1 lesion, 100% stenosed. The lesion was previously treated with a stent (unknown type).  Dist RCA-2 lesion, 100% stenosed. The lesion was previously treated with a stent (unknown type).  Prox RCA lesion, 70% stenosed. The lesion was previously treated with a stent (unknown type) and angioplasty.  Mid RCA lesion, 50% stenosed. The lesion was previously treated with a stent (unknown type).  Ost RPDA to RPDA lesion, 75% stenosed. The lesion was previously treated with a drug-eluting stent.  Mid LAD lesion, 70% stenosed.  Ost 2nd Diag lesion, 70% stenosed. 3. Hypertensive crisis 4. Acute on CKD stage III- creatinine baseline ~1.8  5. Chest pain   Plan   CVP 1. No diuretics for now. Encouraged to drink a little extra fluid today.  Creatinine trending down from 2.3> 1.89.  Today will start hydralazine 50 mg tid/isoridil 20 mg tid. No bb.   Place PICC in anticipation of home milrinone. Continue milrinone 0.25 mcg. Hopefully can get swan out.   Follow renal function closely.   Continue LVAD work up.   Length of Stay: 3   Amy Clegg NP-C  06/25/2015, 7:17 AM  Advanced Heart Failure Team Pager [phone] (M-F; 7a - 4p)  Please contact [facility] for night-coverage after hours (4p -7a ) and weekends on [email or web address]  Patient seen with NP, agree with the above note.    BP now high.  Adding hydralazine/isordil.     Creatinine down to 1.89.  CI 2.2 with low filling pressures => adding hydralazine/isordil today, may be able to titrate down milrinone tomorrow.   Place PICC, when PICC is in can remove Swan.   Loralie Champagne 06/25/2015 12:41 PM

## 2015-06-25 NOTE — Progress Notes (Signed)
Initial Nutrition Assessment  DOCUMENTATION CODES:   Not applicable  INTERVENTION:   Ensure Enlive po TID, each supplement provides 350 kcal and 20 grams of protein  NUTRITION DIAGNOSIS:   Increased nutrient needs related to chronic illness as evidenced by estimated needs.  GOAL:   Patient will meet greater than or equal to 90% of their needs  MONITOR:   PO intake, Supplement acceptance, I & O's  REASON FOR ASSESSMENT:   Consult Assessment of nutrition requirement/status (LVAD)  ASSESSMENT:   Pt with history of CAD, ICM, MI X4 (presents with chest burning radiating to L shoulder) with 17 stents, chronic systolic HF with EF [id number] s/p St. Jude ICD, HTN, gout, and hyperlipidemia. He just relocated to [facility] from [facility] November 2016.    Medications reviewed and include: vitamin D, lovaza Labs reviewed Nutrition-Focused physical exam completed. Findings are mild fat depletion, mild muscle depletion, and no edema.   No recent weight loss but weight does fluctuate. Per pt appetite poor for a long time. Pt lives alone recently moved from [place] to [place]. He likes ensure but does not drink due to fixed income. Discussed coupons and alternatives. He has been eating well here.   Diet Order:  Diet Heart Room service appropriate?: Yes; Fluid consistency:: Thin  Skin:  Reviewed, no issues  Last BM:  6/6  Height:   Ht Readings from Last 1 Encounters:  06/22/15 5\' 7"  (1.702 m)    Weight:   Wt Readings from Last 1 Encounters:  06/25/15 140 lb 3.4 oz (63.6 kg)    Ideal Body Weight:  67.2 kg  BMI:  Body mass index is 21.96 kg/(m^2).  Estimated Nutritional Needs:   Kcal:  1900-2100  Protein:  95-110 grams  Fluid:  >1.5 L/day  EDUCATION NEEDS:   No education needs identified at this time  [place], [place], [place] Pager [phone] After Hours Pager

## 2015-06-25 NOTE — Progress Notes (Signed)
Spoke w pt. He is followed at [facility] by transition team. pcp is dr Ronny Bacon. He has appt next thurs. Pt may go home on home milrinone. Went over list of hhc agencies and no pref. Ref to pam w adv homecare for poss home iv milrinone. Will cont to follow.

## 2015-06-25 NOTE — Care Management (Signed)
Transitional Care Clinic at [place]:  Patient known the [facility] at [place]. This Case Manager met with patient along with Elissa Hefty, RN CM who discussed home health services in case home milrinone needed. This Case Manager inquired about patient's plans for primary care follow-up after discharge, and patient indicated he would like continued follow-up with the [facility] after discharge. Transitional Care Clinic appointment scheduled for 07/01/15 at 0915 with Dr. Jarold Song. Will update AVS with appointment. Patient also indicated he will need transportation to his appointment since his SCAT application is pending. Will inform Clinic Scheduler of patient's need for transportation. Will continue to follow patient's clinical progress closely.

## 2015-06-25 NOTE — Progress Notes (Signed)
CSW has left message for HF Clinic CSW to follow Patient for LVAD evaluation. HF Clinic CSW is out of the office today and will follow up upon return.     Emiliano Dyer, LCSW [facility] ED/60M Clinical Social Worker [phone]

## 2015-06-25 NOTE — Progress Notes (Deleted)
Attempted to wean 02 to high flow [place] desat to 62% placed back on nonrebreather mask sats now 93%. RN aware.

## 2015-06-25 NOTE — Progress Notes (Addendum)
Pre-op Cardiac Surgery  *Preliminary Results* Bilateral lower extremity venous duplex completed. Bilateral lower extremities are negative for deep vein thrombosis. There is no evidence of Baker's cyst bilaterally. 06/25/2015 3:26 PM   Carotid Findings:  There is no obvious evidence of hemodynamically significant internal carotid artery stenosis bilaterally. Vertebral arteries are patent with antegrade flow. 06/26/2015 11:25 AM   ABI:    Lower  Extremity Right Left  Dorsalis Pedis 118-Triphasic 116-Triphasic  Anterior Tibial    Posterior Tibial 129-Triphasic 136-Triphasic  Great Toe 70 84  Ankle/Brachial Indices 0.83 0.88  Toe/Brachial Indices 0.45 0.54   Bilateral ABIs are suggestive of mild arterial insufficiency. Bilateral TBIs are abnormal.  06/26/2015 2:12 PM Maudry Mayhew, RVT, RDCS, RDMS

## 2015-06-25 NOTE — Telephone Encounter (Signed)
Attempted to get in touch with Vista Deck (wife) regarding setting up a time to meet with LVAD team, palliative medicine and LCSW on Monday. Chett told me that she will be arriving on Saturday from [place] with their children and is planning on visiting.   His sister from [facility] did not arrive as I was told.   Janene Madeira, RN VAD Coordinator   Office: [phone] 24/7 VAD Pager: [phone]

## 2015-06-26 ENCOUNTER — Inpatient Hospital Stay (HOSPITAL_COMMUNITY): Payer: Medicare Other

## 2015-06-26 DIAGNOSIS — I25118 Atherosclerotic heart disease of native coronary artery with other forms of angina pectoris: Secondary | ICD-10-CM

## 2015-06-26 DIAGNOSIS — Z01810 Encounter for preprocedural cardiovascular examination: Secondary | ICD-10-CM

## 2015-06-26 DIAGNOSIS — I5023 Acute on chronic systolic (congestive) heart failure: Secondary | ICD-10-CM

## 2015-06-26 LAB — CBC
HEMATOCRIT: 41 % (ref 39.0–52.0)
Hemoglobin: 13.6 g/dL (ref 13.0–17.0)
MCH: 32.9 pg (ref 26.0–34.0)
MCHC: 33.2 g/dL (ref 30.0–36.0)
MCV: 99 fL (ref 78.0–100.0)
PLATELETS: 141 10*3/uL — AB (ref 150–400)
RBC: 4.14 MIL/uL — ABNORMAL LOW (ref 4.22–5.81)
RDW: 12.5 % (ref 11.5–15.5)
WBC: 5.8 10*3/uL (ref 4.0–10.5)

## 2015-06-26 LAB — HEPATITIS C ANTIBODY

## 2015-06-26 LAB — CARBOXYHEMOGLOBIN
CARBOXYHEMOGLOBIN: 1.6 % — AB (ref 0.5–1.5)
Methemoglobin: 0.6 % (ref 0.0–1.5)
O2 SAT: 69.3 %
TOTAL HEMOGLOBIN: 14.2 g/dL (ref 13.5–18.0)

## 2015-06-26 LAB — BASIC METABOLIC PANEL
Anion gap: 10 (ref 5–15)
BUN: 28 mg/dL — AB (ref 6–20)
CALCIUM: 9.5 mg/dL (ref 8.9–10.3)
CHLORIDE: 107 mmol/L (ref 101–111)
CO2: 21 mmol/L — AB (ref 22–32)
CREATININE: 1.61 mg/dL — AB (ref 0.61–1.24)
GFR calc non Af Amer: 48 mL/min — ABNORMAL LOW (ref >60)
GFR, EST AFRICAN AMERICAN: 56 mL/min — AB (ref >60)
GLUCOSE: 103 mg/dL — AB (ref 65–99)
Potassium: 4 mmol/L (ref 3.5–5.1)
Sodium: 138 mmol/L (ref 135–145)

## 2015-06-26 LAB — HEPATITIS B CORE ANTIBODY, TOTAL: HEP B C TOTAL AB: NEGATIVE

## 2015-06-26 LAB — GLUCOSE, CAPILLARY: Glucose-Capillary: 161 mg/dL — ABNORMAL HIGH (ref 65–99)

## 2015-06-26 LAB — HEPATITIS B SURFACE ANTIBODY,QUALITATIVE: HEP B S AB: NONREACTIVE

## 2015-06-26 LAB — LUPUS ANTICOAGULANT PANEL
DRVVT: 41.1 s (ref 0.0–47.0)
PTT LA: 29.3 s (ref 0.0–43.6)

## 2015-06-26 LAB — HEPATITIS B SURFACE ANTIGEN: Hepatitis B Surface Ag: NEGATIVE

## 2015-06-26 LAB — ABO/RH: ABO/RH(D): O POS

## 2015-06-26 MED ORDER — HYDRALAZINE HCL 50 MG PO TABS
75.0000 mg | ORAL_TABLET | Freq: Three times a day (TID) | ORAL | Status: DC
  Administered 2015-06-26 – 2015-06-27 (×3): 75 mg via ORAL
  Filled 2015-06-26 (×3): qty 1

## 2015-06-26 NOTE — Progress Notes (Signed)
Palliative:  Following for VAD eval. Waiting for family/support to come for formal discussion/evaluation. Will f/u again on Monday for eval.   Vinie Sill, NP Palliative Medicine Team Pager # [phone] (M-F 8a-5p) Team Phone # [phone] (Nights/Weekends)

## 2015-06-26 NOTE — Consult Note (Signed)
[address]       [place] [address]             [phone]        Marvin Martin [place] Medical Record [id number] Date of Birth: 1965/09/16  Referring:Bensimhon MDPrimary Care: Arnoldo Morale, MD  Chief Complaint:    Chief Complaint  Patient presents with  . Chest Pain  Patient examined, cardiac catheterization, right heart cath, echocardiogram, CT scan of chest all personally reviewed and counseled with patient  History of Present Illness:    50 year old AA non-diabetic nonsmoker was readmitted again to the hospital for symptoms of shortness of breath, cough, decreased exercise tolerance and chest tightness. The patient has been hospitalized several times for the same symptoms since moving to [facility] from [place] November 2016. The patient has history of multiple coronary stents dating back to over 20 years ago. The patient has a diagnosis of ischemic cardiomyopathy with ejection fraction of 20-25 percent. On admission he was found to have low cardiac index 1.7 and was started on milrinone. With additional diuresis his symptoms have significantly improved and he is now moved to step down status.  The patient has a long history of hypertension. He denies smoking, diabetes, drug use, or strong family history of CAD. Echocardiogram shows severe LV dysfunction with dilatation, RV function is mild- moderately reduced. There is no significant TR, AI. There is mild-moderate MR.  The patient has an AICD-pacemaker which was placed in [place] but he denies history of recent discharge. The patient  denies history of GI bleeding and he has been on Coumadin in the past. He is currently on Brilinta for his stents  the patient had creatinine of 2.0 on admission and is now improved to 1.7 with milrinone. The patient has a right renal artery stent, positive protein in his urine, positive casts in his urine. The patient has never had colonoscopy. The patient denies  history of significant alcohol intake.    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   patient has recently separated from his wife moved to [place]. He has no family in the region and appears to have some difficulty with transportation.   Zubrod Score: At the time of surgery this patient's most appropriate activity status/level should be described as: []     0    Normal activity, no symptoms []     1    Restricted in physical strenuous activity but ambulatory, able to do out light work []     2    Ambulatory and capable of self care, unable to do work activities, up and about                 more than 50%  Of the time                            [x]     3    Only limited self care, in bed greater than 50% of waking hours []     4    Completely disabled, no self care, confined to bed or chair []     5    Moribund  Past Medical History  Diagnosis Date  . Chronic systolic CHF (congestive heart failure) ([place])     a. 01/2015 Echo: EF 20-25%, antlat, inflat AK.  [name] Hyperlipidemia   . Coronary artery disease     a.  s/p MI x 4;  b. Reported h/o 17 stents with recurrent ISR;  c. 02/2014 Cath ([place]): PCI to unknown vessel;  d. 01/2015 MV: EF 18%, large scar, no ischemia; e. 03/2015 PCI: LM nl, LAD nl, D2 patent stent, RI patent stent, LCX patent stents p/m, RCA 95p ISR (PTCA), 44m, 100d into RPL CTO, RPDA 90 (2.5x16 Synergy DES).  . Hypertensive heart disease   . Ischemic cardiomyopathy     a. s/p SJM ICD 2007 w/ gen change in 2012; b. 12/2014 CPX Test: mild to mod HF limitation w/ pVO2 20.3 and nl slope;  c. 01/2015 Echo: EF 20-25%.  . CKD (chronic kidney disease), stage III     Past Surgical History  Procedure Laterality Date  . [place] placements Bilateral   . Pacemaker generator change Bilateral   . Insert / replace / remove pacemaker      St jude 2006  Generator Change 2012   . Cardiac catheterization      Most recent 02/2014   . Cardiac catheterization N/A  03/31/2015    Procedure: Left Heart Cath and Coronary Angiography;  Surgeon: Troy Sine, MD;  Location: [place] [facility];  Service: Cardiovascular;  Laterality: N/A;  . Cardiac catheterization N/A 03/31/2015    Procedure: Coronary Stent Intervention;  Surgeon: Troy Sine, MD;  Location: [place] [facility];  Service: Cardiovascular;  Laterality: N/A;  . Cardiac catheterization N/A 05/27/2015    Procedure: Right/Left Heart Cath and Coronary Angiography;  Surgeon: Jolaine Artist, MD;  Location: [place] [facility];  Service: Cardiovascular;  Laterality: N/A;  . Cardiac catheterization N/A 06/23/2015    Procedure: Right/Left Heart Cath and Coronary Angiography;  Surgeon: Jolaine Artist, MD;  Location: [place] [facility];  Service: Cardiovascular;  Laterality: N/A;    History  Smoking status  . Never Smoker   Smokeless tobacco  . Not on file    History  Alcohol Use No    Social History   Social History  . Marital Status: Legally Separated    Spouse Name: N/A  . Number of Children: N/A  . Years of Education: N/A   Occupational History  . Not on file.   Social History Main Topics  . Smoking status: Never Smoker   . Smokeless tobacco: Not on file  . Alcohol Use: No  . Drug Use: No  . Sexual Activity: No   Other Topics Concern  . Not on file   Social History Narrative   Moved to [place] from [place] in 2016.  Lives alone in [place].    No Known Allergies  Current Facility-Administered Medications  Medication Dose Route Frequency Provider Last Rate Last Dose  . 0.9 %  sodium chloride infusion  250 mL Intravenous PRN Jolaine Artist, MD 10 mL/hr at 06/26/15 1415 250 mL at 06/26/15 1415  . acetaminophen (TYLENOL) tablet 650 mg  650 mg Oral Q4H PRN Jolaine Artist, MD      . aspirin EC tablet 81 mg  81 mg Oral Daily Jules Husbands, MD   81 mg at 06/26/15 1024  . calcitRIOL (ROCALTROL) capsule 0.25 mcg  0.25 mcg Oral QODAY Jules Husbands, MD   0.25 mcg at 06/25/15 [id number]  .  cholecalciferol (VITAMIN D) tablet 1,000 Units  1,000 Units Oral Daily Jules Husbands, MD   1,000 Units at 06/26/15 1024  . enoxaparin (LOVENOX) injection 40 mg  40 mg Subcutaneous Q24H Jolaine Artist, MD   40 mg at  06/26/15 0801  . ezetimibe (ZETIA) tablet 10 mg  10 mg Oral Daily Jules Husbands, MD   10 mg at 06/26/15 1024  . feeding supplement (ENSURE ENLIVE) (ENSURE ENLIVE) liquid 237 mL  237 mL Oral TID BM Jolaine Artist, MD   237 mL at 06/26/15 1400  . hydrALAZINE (APRESOLINE) tablet 75 mg  75 mg Oral Q8H Jolaine Artist, MD   75 mg at 06/26/15 1416  . isosorbide dinitrate (ISORDIL) tablet 20 mg  20 mg Oral TID Conrad Hilmar-Irwin, NP   20 mg at 06/26/15 1024  . milrinone (PRIMACOR) 20 MG/100 ML (0.2 mg/mL) infusion  0.25 mcg/kg/min Intravenous Continuous Shaune Pascal Bensimhon, MD 4.7 mL/hr at 06/26/15 0700 0.25 mcg/kg/min at 06/26/15 0700  . nitroGLYCERIN (NITROSTAT) SL tablet 0.4 mg  0.4 mg Sublingual Q5 Min x 3 PRN Jules Husbands, MD      . omega-3 acid ethyl esters (LOVAZA) capsule 1 g  1 g Oral Daily Jules Husbands, MD   1 g at 06/26/15 1024  . ondansetron (ZOFRAN) injection 4 mg  4 mg Intravenous Q6H PRN Jolaine Artist, MD   4 mg at 06/23/15 1842  . ondansetron (ZOFRAN) tablet 4 mg  4 mg Oral Q8H PRN Jules Husbands, MD      . rosuvastatin (CRESTOR) tablet 40 mg  40 mg Oral Daily Jules Husbands, MD   40 mg at 06/26/15 1024  . sodium chloride flush (NS) 0.9 % injection 10-40 mL  10-40 mL Intracatheter Q12H Jolaine Artist, MD   10 mL at 06/26/15 1028  . sodium chloride flush (NS) 0.9 % injection 10-40 mL  10-40 mL Intracatheter PRN Jolaine Artist, MD      . sodium chloride flush (NS) 0.9 % injection 3 mL  3 mL Intravenous Q12H Jolaine Artist, MD   3 mL at 06/26/15 1025  . sodium chloride flush (NS) 0.9 % injection 3 mL  3 mL Intravenous PRN Jolaine Artist, MD      . ticagrelor [facility]) tablet 90 mg  90 mg Oral BID Jules Husbands, MD   90 mg at 06/26/15 1025    Prescriptions prior to  admission  Medication Sig Dispense Refill Last Dose  . Alirocumab (PRALUENT) 75 MG/ML SOPN Inject 1 pen into the skin every 14 (fourteen) days. 2 pen 11 06/08/2015  . allopurinol (ZYLOPRIM) 100 MG tablet Take 100 mg by mouth 2 (two) times daily.    06/21/2015 at Unknown time  . amLODipine (NORVASC) 10 MG tablet Take 10 mg by mouth daily.   06/21/2015 at Unknown time  . aspirin 81 MG tablet Take 81 mg by mouth daily.   06/21/2015 at Unknown time  . calcitRIOL (ROCALTROL) 0.25 MCG capsule Take 0.25 mcg by mouth every other day. Take on Monday, Wednesday and Friday   06/21/2015 at Unknown time  . carvedilol (COREG) 25 MG tablet Take 1 tablet (25 mg total) by mouth 2 (two) times daily with a meal. 180 tablet 1 06/21/2015 at 830 pm  . cholecalciferol (VITAMIN D) 1000 UNITS tablet Take 1,000 Units by mouth daily.   06/21/2015 at Unknown time  . ezetimibe (ZETIA) 10 MG tablet Take 10 mg by mouth daily.   06/21/2015 at Unknown time  . furosemide (LASIX) 40 MG tablet Take 40 mg by mouth daily.    06/21/2015 at Unknown time  . hydrALAZINE (APRESOLINE) 50 MG tablet TAKE 1 TABLET 3 TIMES A DAY 90 tablet 3 06/21/2015 at Unknown time  .  isosorbide dinitrate (ISORDIL) 20 MG tablet TAKE 1 TABLET 3 TIMES A DAY 90 tablet 3 06/21/2015 at Unknown time  . nitroGLYCERIN (NITROSTAT) 0.4 MG SL tablet Place 1 tablet (0.4 mg total) under the tongue every 5 (five) minutes as needed for chest pain. Reported on 04/12/2015 60 tablet 1 PRN  . omega-3 acid ethyl esters (LOVAZA) 1 G capsule Take 1 g by mouth daily.    06/21/2015 at Unknown time  . ondansetron (ZOFRAN) 4 MG tablet Take 4 mg by mouth every 8 (eight) hours as needed for nausea or vomiting. Reported on 05/24/2015   PRN  . rosuvastatin (CRESTOR) 40 MG tablet Take 40 mg by mouth daily.   06/21/2015 at Unknown time  . sacubitril-valsartan (ENTRESTO) 97-103 MG Take 1 tablet by mouth 2 (two) times daily. 60 tablet 6 06/21/2015 at Unknown time  . spironolactone (ALDACTONE) 25 MG tablet Take 25 mg by  mouth daily.   06/21/2015 at Unknown time  . ticagrelor (BRILINTA) 90 MG TABS tablet Take 1 tablet (90 mg total) by mouth 2 (two) times daily. 60 tablet 5 06/21/2015 at Unknown time    Family History  Problem Relation Age of Onset  . Cancer Mother     died when pt was 44.  She had a h/o heart dzs as well.  . Hypertension Mother   . Hyperlipidemia Father     Father died when pt was age 51 - he believes that he had a cardiac hx.  [name] CAD Sister      Review of Systems:       Cardiac Review of Systems: Y or N  Chest Pain [  Yes   ]  Resting SOB [   ] Exertional SOB  [ yes   ]  Orthopnea [  ]   Pedal Edema [   ]    Palpitations [  yes  ] Syncope  [  ]   Presyncope [   ]  General Review of Systems: [Y] = yes [  ]=no Constitional: recent weight change [  yes weight loss of 15 pounds in the past for-6 months  ]; anorexia [  ]; fatigue [ yes   ]; nausea [ yes   ]; night sweats [  ]; fever [  ]; or chills [  ]                                                               Dental: poor dentition[  ]; Last Dentist visit:  every year   Eye : blurred vision [  ]; diplopia [   ]; vision changes [  ];  Amaurosis fugax[  ]; Resp: cough [  ];  wheezing[  ];  hemoptysis[  ]; shortness of breath[  ]; paroxysmal nocturnal dyspnea[  ]; dyspnea on exertion[  ]; or orthopnea[  ];  GI:  gallstones[  ], vomiting[  ];  dysphagia[  ]; melena[  ];  hematochezia [  ]; heartburn[  ];   Hx of  Colonoscopy[  ]; GU: kidney stones [  ]; hematuria[  ];   dysuria [  ];  nocturia[  ];  history of     obstruction [  ]; urinary frequency [  ]  Skin: rash, swelling[  ];, hair loss[  ];  peripheral edema[  ];  or itching[  ]; Musculosketetal: myalgias[  ];  joint swelling[  ];  joint erythema[  ];  joint pain[  ];  back pain[  ];  Heme/Lymph: bruising[  ];  bleeding[  ];  anemia[  ];  Neuro: TIA[  ];  headaches[  ];  stroke[  ];  vertigo[  ];  seizures[  ];   paresthesias[  ];  difficulty walking[  ];  Psych:depression[   ]; anxiety[  ];  Endocrine: diabetes[  ];  thyroid dysfunction[  ];  Immunizations: Flu [  ]; Pneumococcal[  ];  Other:  Physical Exam: BP 135/104 mmHg  Pulse 106  Temp(Src) 98 F (36.7 C) (Oral)  Resp 18  Ht 5\' 7"  (1.702 m)  Wt 139 lb 5.3 oz (63.2 kg)  BMI 21.82 kg/m2  SpO2 96%       Physical Exam  BSA 1.8  General: Small-pleasant middle-aged AA male in no distress  HEENT: Normocephalic pupils equal , dentition adequate Neck: Supple without JVD, adenopathy, or bruit Chest: Clear to auscultation, symmetrical breath sounds, no rhonchi, no tenderness             or deformity Cardiovascular: Regular rate and rhythm, no murmur, no gallop, peripheral pulses             palpable in all extremities Abdomen:  Soft, nontender, no palpable mass or organomegaly Extremities: Warm, well-perfused, no clubbing cyanosis edema or tenderness,              no venous stasis changes of the legs Rectal/GU: Deferred Neuro: Grossly non--focal and symmetrical throughout Skin: Clean and dry without rash or ulceration   Diagnostic Studies & Laboratory data:     Recent Radiology Findings:   Ct Abdomen Pelvis Wo Contrast  06/26/2015  CLINICAL DATA:  50 year old male with a history of heart failure. Preoperative left ventricular assist device. EXAM: CT CHEST, ABDOMEN AND PELVIS WITHOUT CONTRAST TECHNIQUE: Multidetector CT imaging of the chest, abdomen and pelvis was performed following the standard protocol without IV contrast. COMPARISON:  None. FINDINGS: CT CHEST Cardiac pacing device on left chest wall the streak artifact. Pacer leads traversed the left subclavian vein, terminating in the right atrium a and at the apex of the right ventricle. No axillary or supraclavicular adenopathy. Unremarkable appearance of the thoracic inlet. Central airways patent with no endotracheal debris. Lymph nodes within the mediastinum, not enlarged. Heart size unremarkable. Dense calcifications/ stents involving the left  anterior descending distribution. Calcifications/stents in distribution of the right coronary artery and circumflex coronary artery. No pericardial fluid/ thickening. No pleural effusion. No confluent airspace disease. No pneumothorax. No displaced fracture. Right upper extremity PICC, new from the plain film 06/21/2015 CT ABDOMEN AND PELVIS Unremarkable appearance of liver and spleen. Unremarkable appearance of bilateral adrenal glands. No peripancreatic or pericholecystic fluid or inflammatory changes. Gallbladder decompressed. No intrahepatic or extrahepatic biliary ductal dilatation. No intra-peritoneal free air or significant free-fluid. No abnormally dilated small bowel or colon. No transition point. No inflammatory changes of the mesenteries. Appendix is not visualized, however, no inflammatory changes are present adjacent to the cecum to indicate an appendicitis. No diverticular disease. Right Kidney/Ureter: No hydronephrosis. No nephrolithiasis. No perinephric stranding. Unremarkable course of the right ureter. Renal artery stent present within the mid right renal artery. Left Kidney/Ureter: No hydronephrosis. No nephrolithiasis. No perinephric stranding. Unremarkable course of the left ureter. Low-density cystic lesion of the left  kidney measures 4 Hounsfield units with greatest diameter of 18 mm. Unremarkable appearance of the urinary bladder. No significant vascular calcification. No aneurysm or periaortic fluid identified. Scattered vascular calcifications of the mesenteric vessels and of the pelvic vessels. Musculoskeletal: No displaced fracture identified. Degenerative disc disease most pronounced at the L3-S1 levels. Vacuum disc phenomenon present. No bony canal narrowing. Minimal degenerative changes of the hips. IMPRESSION: No acute finding. Left chest wall cardiac pacer/ AICD with 2 leads in place. Evidence of coronary artery disease, with stents/ calcifications in the distribution of the left  anterior descending, circumflex, right coronary arteries. The patient is status post right renal artery stent, with stent placement in the mid right renal artery. Low-density cystic lesion of the left kidney is incompletely characterized, though most likely represents a Bosniak 1 cyst. Right upper extremity PICC Signed, Dulcy Fanny. Earleen Newport, DO [facility] Radiology Electronically Signed   By: Corrie Mckusick D.O.   On: 06/26/2015 07:10   Dg Orthopantogram  06/26/2015  CLINICAL DATA:  Evaluate for periapical abscesses. Initial encounter. EXAM: ORTHOPANTOGRAM/PANORAMIC COMPARISON:  None. FINDINGS: No definite periapical abscesses are seen. There appears to be a dental caries involving the remaining right maxillary molar, though this would be better assessed on dental exam. There is suggestion of mild loosening about the root of the left lateral mandibular incisor. The visualized mandible appears grossly intact. The visualized paranasal sinuses are well-aerated. IMPRESSION: No definite periapical abscess seen. Apparent dental caries involving the remaining right maxillary molar, though this would be better assessed on dental exam. Suggestion of mild loosening about the root of the left lateral mandibular incisor. Electronically Signed   By: Garald Balding M.D.   On: 06/26/2015 03:27   Ct Chest Wo Contrast  06/26/2015  CLINICAL DATA:  50 year old male with a history of heart failure. Preoperative left ventricular assist device. EXAM: CT CHEST, ABDOMEN AND PELVIS WITHOUT CONTRAST TECHNIQUE: Multidetector CT imaging of the chest, abdomen and pelvis was performed following the standard protocol without IV contrast. COMPARISON:  None. FINDINGS: CT CHEST Cardiac pacing device on left chest wall the streak artifact. Pacer leads traversed the left subclavian vein, terminating in the right atrium a and at the apex of the right ventricle. No axillary or supraclavicular adenopathy.  Unremarkable appearance of the thoracic inlet. Central airways patent with no endotracheal debris. Lymph nodes within the mediastinum, not enlarged. Heart size unremarkable. Dense calcifications/ stents involving the left anterior descending distribution. Calcifications/stents in distribution of the right coronary artery and circumflex coronary artery. No pericardial fluid/ thickening. No pleural effusion. No confluent airspace disease. No pneumothorax. No displaced fracture. Right upper extremity PICC, new from the plain film 06/21/2015 CT ABDOMEN AND PELVIS Unremarkable appearance of liver and spleen. Unremarkable appearance of bilateral adrenal glands. No peripancreatic or pericholecystic fluid or inflammatory changes. Gallbladder decompressed. No intrahepatic or extrahepatic biliary ductal dilatation. No intra-peritoneal free air or significant free-fluid. No abnormally dilated small bowel or colon. No transition point. No inflammatory changes of the mesenteries. Appendix is not visualized, however, no inflammatory changes are present adjacent to the cecum to indicate an appendicitis. No diverticular disease. Right Kidney/Ureter: No hydronephrosis. No nephrolithiasis. No perinephric stranding. Unremarkable course of the right ureter. Renal artery stent present within the mid right renal artery. Left Kidney/Ureter: No hydronephrosis. No nephrolithiasis. No perinephric stranding. Unremarkable course of the left ureter. Low-density cystic lesion of the left kidney measures 4 Hounsfield units with greatest diameter of 18 mm. Unremarkable appearance of the  urinary bladder. No significant vascular calcification. No aneurysm or periaortic fluid identified. Scattered vascular calcifications of the mesenteric vessels and of the pelvic vessels. Musculoskeletal: No displaced fracture identified. Degenerative disc disease most pronounced at the L3-S1 levels. Vacuum disc phenomenon present. No bony canal narrowing. Minimal  degenerative changes of the hips. IMPRESSION: No acute finding. Left chest wall cardiac pacer/ AICD with 2 leads in place. Evidence of coronary artery disease, with stents/ calcifications in the distribution of the left anterior descending, circumflex, right coronary arteries. The patient is status post right renal artery stent, with stent placement in the mid right renal artery. Low-density cystic lesion of the left kidney is incompletely characterized, though most likely represents a Bosniak 1 cyst. Right upper extremity PICC Signed, Dulcy Fanny. Earleen Newport, DO [facility] Radiology Electronically Signed   By: Corrie Mckusick D.O.   On: 06/26/2015 07:10     I have independently reviewed the above radiologic studies.  Recent Lab Findings: Lab Results  Component Value Date   WBC 5.8 06/26/2015   HGB 13.6 06/26/2015   HCT 41.0 06/26/2015   PLT 141* 06/26/2015   GLUCOSE 103* 06/26/2015   CHOL 126 06/22/2015   TRIG 208* 06/22/2015   HDL 55 06/22/2015   LDLCALC 29 06/22/2015   ALT 34 06/22/2015   AST 29 06/22/2015   NA 138 06/26/2015   K 4.0 06/26/2015   CL 107 06/26/2015   CREATININE 1.61* 06/26/2015   BUN 28* 06/26/2015   CO2 21* 06/26/2015   TSH 0.330* 06/22/2015   INR 1.19 06/23/2015   HGBA1C 5.1 06/22/2015      Assessment / Plan:     Ischemic cardiomyopathy status post multiple previous PCI coronary stents   acute on chronic systolic heart failure with cardiogenic shock by right heart cath data now improved with milrinone    Chronic kidney disease stage III with positive protein and cast in urine, creatinine improved after milrinone started    History of hypertension    Patient medically appears to be an appropriate candidate for implantable VAD   patient's support system-social situation appears to be problematic and will need to be further evaluated by VAD team social worker  we'll follow   @ME1 @ 06/26/2015 4:05 PM

## 2015-06-26 NOTE — Progress Notes (Signed)
Advanced Heart Failure Rounding Note  PCP: Dr Laney Pastor Primary Cardiologist: Dr Haroldine Laws  Subjective:    Admitted 06/22/15 with worsening SOB accompanied by nausea and diaphoresis. SBP up to 190. Started on nitro drip.    Post cath started on milrinone for low output. Received gently hydration yesterday due to elevated creatinine. Creatinine peaked 2.3.  Luiz Blare out. PICC in. Hydralazine/ntg restarted for HTN. Co-ox 69%. SBP 150-170. Feels good.   R&L heart cath 06/23/15 : Stable CAD with borderline significant lesions in midLAD and mid RCA but not felt to be critical/  Findings: Ao = 87/62 (71) LV = 81/9 RA = 2 RV = 20/0/2 PA = 24/12 (16) PCW = 4 Fick cardiac output/index = 3.1/1.8 Thermo CO/CI = 3.0/1.7 PVR = 3.9 WU Ao sat = 97% PA sat = 54%, 55%   Objective:   Weight Range: 63.2 kg (139 lb 5.3 oz) Body mass index is 21.82 kg/(m^2).   Vital Signs:   Temp:  [97.9 F (36.6 C)-98.8 F (37.1 C)] 98.1 F (36.7 C) (06/10 0726) Pulse Rate:  [50-107] 97 (06/10 0726) Resp:  [0-19] 10 (06/10 0726) BP: (144-184)/(84-156) 174/102 mmHg (06/10 0800) SpO2:  [98 %-100 %] 98 % (06/10 0726) Weight:  [63.2 kg (139 lb 5.3 oz)] 63.2 kg (139 lb 5.3 oz) (06/10 0300) Last BM Date: 06/25/15  Weight change: Filed Weights   06/24/15 0422 06/25/15 0420 06/26/15 0300  Weight: 63.1 kg (139 lb 1.8 oz) 63.6 kg (140 lb 3.4 oz) 63.2 kg (139 lb 5.3 oz)    Intake/Output:   Intake/Output Summary (Last 24 hours) at 06/26/15 1016 Last data filed at 06/26/15 0800  Gross per 24 hour  Intake  743.4 ml  Output   1200 ml  Net -456.6 ml     Physical Exam: CVP 1 General: Well appearing. In bed.  HEENT: normal Neck: supple.Carotids 2+ bilat; no bruits. No thyromegaly or nodule noted. JVP flat.  Cor: PMI laterally displaced. RRR. No M/G/R apprecaited Lungs: Decreased in the bases.   Abdomen: soft, NT, ND, no HSM. No bruits or masses. +BS  Extremities: no cyanosis, clubbing, rash,  edema Neuro: alert & oriented x 3, cranial nerves grossly intact. moves all 4 extremities w/o difficulty. Affect pleasant.  Telemetry: Reviewed, Sinus 90s with PVCs.   Labs: CBC  Recent Labs  06/25/15 0516 06/26/15 0215  WBC 6.0 5.8  HGB 13.7 13.6  HCT 41.0 41.0  MCV 99.3 99.0  PLT 150 [id number]*   Basic Metabolic Panel  Recent Labs  06/25/15 0516 06/26/15 0215  NA 140 138  K 3.5 4.0  CL 108 107  CO2 24 21*  GLUCOSE 104* 103*  BUN 28* 28*  CREATININE 1.89* 1.61*  CALCIUM 9.0 9.5   Liver Function Tests No results for input(s): AST, ALT, ALKPHOS, BILITOT, PROT, ALBUMIN in the last 72 hours. No results for input(s): LIPASE, AMYLASE in the last 72 hours. Cardiac Enzymes No results for input(s): CKTOTAL, CKMB, CKMBINDEX, TROPONINI in the last 72 hours.  BNP: BNP (last 3 results)  Recent Labs  05/24/15 1159 06/21/15 2323  BNP >4500.0* >4500.0*    ProBNP (last 3 results) No results for input(s): PROBNP in the last 8760 hours.   D-Dimer No results for input(s): DDIMER in the last 72 hours. Hemoglobin A1C No results for input(s): HGBA1C in the last 72 hours. Fasting Lipid Panel No results for input(s): CHOL, HDL, LDLCALC, TRIG, CHOLHDL, LDLDIRECT in the last 72 hours. Thyroid Function Tests No results for  input(s): TSH, T4TOTAL, T3FREE, THYROIDAB in the last 72 hours.  Invalid input(s): FREET3  Other results:     Imaging/Studies:  Ct Abdomen Pelvis Wo Contrast  06/26/2015  CLINICAL DATA:  50 year old male with a history of heart failure. Preoperative left ventricular assist device. EXAM: CT CHEST, ABDOMEN AND PELVIS WITHOUT CONTRAST TECHNIQUE: Multidetector CT imaging of the chest, abdomen and pelvis was performed following the standard protocol without IV contrast. COMPARISON:  None. FINDINGS: CT CHEST Cardiac pacing device on left chest wall the streak artifact. Pacer leads traversed the left subclavian vein, terminating in the right atrium a and at the apex  of the right ventricle. No axillary or supraclavicular adenopathy. Unremarkable appearance of the thoracic inlet. Central airways patent with no endotracheal debris. Lymph nodes within the mediastinum, not enlarged. Heart size unremarkable. Dense calcifications/ stents involving the left anterior descending distribution. Calcifications/stents in distribution of the right coronary artery and circumflex coronary artery. No pericardial fluid/ thickening. No pleural effusion. No confluent airspace disease. No pneumothorax. No displaced fracture. Right upper extremity PICC, new from the plain film 06/21/2015 CT ABDOMEN AND PELVIS Unremarkable appearance of liver and spleen. Unremarkable appearance of bilateral adrenal glands. No peripancreatic or pericholecystic fluid or inflammatory changes. Gallbladder decompressed. No intrahepatic or extrahepatic biliary ductal dilatation. No intra-peritoneal free air or significant free-fluid. No abnormally dilated small bowel or colon. No transition point. No inflammatory changes of the mesenteries. Appendix is not visualized, however, no inflammatory changes are present adjacent to the cecum to indicate an appendicitis. No diverticular disease. Right Kidney/Ureter: No hydronephrosis. No nephrolithiasis. No perinephric stranding. Unremarkable course of the right ureter. Renal artery stent present within the mid right renal artery. Left Kidney/Ureter: No hydronephrosis. No nephrolithiasis. No perinephric stranding. Unremarkable course of the left ureter. Low-density cystic lesion of the left kidney measures 4 Hounsfield units with greatest diameter of 18 mm. Unremarkable appearance of the urinary bladder. No significant vascular calcification. No aneurysm or periaortic fluid identified. Scattered vascular calcifications of the mesenteric vessels and of the pelvic vessels. Musculoskeletal: No displaced fracture identified. Degenerative disc disease most pronounced at the L3-S1 levels.  Vacuum disc phenomenon present. No bony canal narrowing. Minimal degenerative changes of the hips. IMPRESSION: No acute finding. Left chest wall cardiac pacer/ AICD with 2 leads in place. Evidence of coronary artery disease, with stents/ calcifications in the distribution of the left anterior descending, circumflex, right coronary arteries. The patient is status post right renal artery stent, with stent placement in the mid right renal artery. Low-density cystic lesion of the left kidney is incompletely characterized, though most likely represents a Bosniak 1 cyst. Right upper extremity PICC Signed, Dulcy Fanny. Earleen Newport, DO [facility] Radiology Electronically Signed   By: Corrie Mckusick D.O.   On: 06/26/2015 07:10   Dg Orthopantogram  06/26/2015  CLINICAL DATA:  Evaluate for periapical abscesses. Initial encounter. EXAM: ORTHOPANTOGRAM/PANORAMIC COMPARISON:  None. FINDINGS: No definite periapical abscesses are seen. There appears to be a dental caries involving the remaining right maxillary molar, though this would be better assessed on dental exam. There is suggestion of mild loosening about the root of the left lateral mandibular incisor. The visualized mandible appears grossly intact. The visualized paranasal sinuses are well-aerated. IMPRESSION: No definite periapical abscess seen. Apparent dental caries involving the remaining right maxillary molar, though this would be better assessed on dental exam. Suggestion of mild loosening about the root of the left lateral mandibular incisor. Electronically Signed   By: Jacqulynn Cadet  Chang M.D.   On: 06/26/2015 03:27   Ct Chest Wo Contrast  06/26/2015  CLINICAL DATA:  50 year old male with a history of heart failure. Preoperative left ventricular assist device. EXAM: CT CHEST, ABDOMEN AND PELVIS WITHOUT CONTRAST TECHNIQUE: Multidetector CT imaging of the chest, abdomen and pelvis was performed following the standard protocol  without IV contrast. COMPARISON:  None. FINDINGS: CT CHEST Cardiac pacing device on left chest wall the streak artifact. Pacer leads traversed the left subclavian vein, terminating in the right atrium a and at the apex of the right ventricle. No axillary or supraclavicular adenopathy. Unremarkable appearance of the thoracic inlet. Central airways patent with no endotracheal debris. Lymph nodes within the mediastinum, not enlarged. Heart size unremarkable. Dense calcifications/ stents involving the left anterior descending distribution. Calcifications/stents in distribution of the right coronary artery and circumflex coronary artery. No pericardial fluid/ thickening. No pleural effusion. No confluent airspace disease. No pneumothorax. No displaced fracture. Right upper extremity PICC, new from the plain film 06/21/2015 CT ABDOMEN AND PELVIS Unremarkable appearance of liver and spleen. Unremarkable appearance of bilateral adrenal glands. No peripancreatic or pericholecystic fluid or inflammatory changes. Gallbladder decompressed. No intrahepatic or extrahepatic biliary ductal dilatation. No intra-peritoneal free air or significant free-fluid. No abnormally dilated small bowel or colon. No transition point. No inflammatory changes of the mesenteries. Appendix is not visualized, however, no inflammatory changes are present adjacent to the cecum to indicate an appendicitis. No diverticular disease. Right Kidney/Ureter: No hydronephrosis. No nephrolithiasis. No perinephric stranding. Unremarkable course of the right ureter. Renal artery stent present within the mid right renal artery. Left Kidney/Ureter: No hydronephrosis. No nephrolithiasis. No perinephric stranding. Unremarkable course of the left ureter. Low-density cystic lesion of the left kidney measures 4 Hounsfield units with greatest diameter of 18 mm. Unremarkable appearance of the urinary bladder. No significant vascular calcification. No aneurysm or periaortic  fluid identified. Scattered vascular calcifications of the mesenteric vessels and of the pelvic vessels. Musculoskeletal: No displaced fracture identified. Degenerative disc disease most pronounced at the L3-S1 levels. Vacuum disc phenomenon present. No bony canal narrowing. Minimal degenerative changes of the hips. IMPRESSION: No acute finding. Left chest wall cardiac pacer/ AICD with 2 leads in place. Evidence of coronary artery disease, with stents/ calcifications in the distribution of the left anterior descending, circumflex, right coronary arteries. The patient is status post right renal artery stent, with stent placement in the mid right renal artery. Low-density cystic lesion of the left kidney is incompletely characterized, though most likely represents a Bosniak 1 cyst. Right upper extremity PICC Signed, Dulcy Fanny. Earleen Newport, DO [facility] Radiology Electronically Signed   By: Corrie Mckusick D.O.   On: 06/26/2015 07:10    Latest Echo  Latest Cath   Medications:     Scheduled Medications: . aspirin EC  81 mg Oral Daily  . calcitRIOL  0.25 mcg Oral QODAY  . cholecalciferol  1,000 Units Oral Daily  . enoxaparin (LOVENOX) injection  40 mg Subcutaneous Q24H  . ezetimibe  10 mg Oral Daily  . feeding supplement (ENSURE ENLIVE)  237 mL Oral TID BM  . hydrALAZINE  50 mg Oral Q8H  . isosorbide dinitrate  20 mg Oral TID  . omega-3 acid ethyl esters  1 g Oral Daily  . rosuvastatin  40 mg Oral Daily  . sodium chloride flush  10-40 mL Intracatheter Q12H  . sodium chloride flush  3 mL Intravenous Q12H  . ticagrelor  90 mg Oral BID  Infusions: . milrinone 0.25 mcg/kg/min (06/26/15 0700)    PRN Medications:     Assessment   1. Acute on chronic systolic CHF -> cardiogenic shock 2. CAD - MI x 4. Stents x 17.   Dist RCA-1 lesion, 100% stenosed. The lesion was previously treated with a stent (unknown type).  Dist RCA-2 lesion, 100%  stenosed. The lesion was previously treated with a stent (unknown type).  Prox RCA lesion, 70% stenosed. The lesion was previously treated with a stent (unknown type) and angioplasty.  Mid RCA lesion, 50% stenosed. The lesion was previously treated with a stent (unknown type).  Ost RPDA to RPDA lesion, 75% stenosed. The lesion was previously treated with a drug-eluting stent.  Mid LAD lesion, 70% stenosed.  Ost 2nd Diag lesion, 70% stenosed. 3. Hypertensive crisis 4. Acute on CKD stage III- creatinine baseline ~1.8 (improved) 5. Chest pain   Plan   Continues to improve. Luiz Blare out. PICC in.  Creatinine down to 1.61. Electrolytes ok.  Co-ox improved. Continue milrinone.   BP remains high. Will titrate hydralazine/isordil today. CVP low so no diuretics.   CAD severe but not critical at this point. HF major issue. Continue statin, Brilinta, ECASA.   Continue LVAD work up. Can go to SDU. If decision to prrceed with VAD is made will need to bridge Brillinta with aggrastat.   Length of Stay: 4   Adonay Scheier MD  06/26/2015, 10:16 AM  Advanced Heart Failure Team Pager [phone] (M-F; [place])  Please contact [facility] for night-coverage after hours (4p -7a ) and weekends on [email or web address]

## 2015-06-26 NOTE — Progress Notes (Signed)
Observed pt walking from 2H02 to 2H21 and then up in room independently. HR 124 ST. Encouraged pt to walk more later with staff. Yves Dill CES, ACSM 3:30 PM 06/26/2015

## 2015-06-27 DIAGNOSIS — I10 Essential (primary) hypertension: Secondary | ICD-10-CM

## 2015-06-27 LAB — CBC
HEMATOCRIT: 38.7 % — AB (ref 39.0–52.0)
HEMOGLOBIN: 12.7 g/dL — AB (ref 13.0–17.0)
MCH: 33.1 pg (ref 26.0–34.0)
MCHC: 32.8 g/dL (ref 30.0–36.0)
MCV: 100.8 fL — ABNORMAL HIGH (ref 78.0–100.0)
Platelets: 146 10*3/uL — ABNORMAL LOW (ref 150–400)
RBC: 3.84 MIL/uL — ABNORMAL LOW (ref 4.22–5.81)
RDW: 12.9 % (ref 11.5–15.5)
WBC: 6 10*3/uL (ref 4.0–10.5)

## 2015-06-27 LAB — BASIC METABOLIC PANEL
Anion gap: 10 (ref 5–15)
BUN: 31 mg/dL — ABNORMAL HIGH (ref 6–20)
CHLORIDE: 103 mmol/L (ref 101–111)
CO2: 25 mmol/L (ref 22–32)
Calcium: 9.2 mg/dL (ref 8.9–10.3)
Creatinine, Ser: 1.71 mg/dL — ABNORMAL HIGH (ref 0.61–1.24)
GFR calc non Af Amer: 45 mL/min — ABNORMAL LOW (ref >60)
GFR, EST AFRICAN AMERICAN: 52 mL/min — AB (ref >60)
Glucose, Bld: 161 mg/dL — ABNORMAL HIGH (ref 65–99)
POTASSIUM: 3.7 mmol/L (ref 3.5–5.1)
SODIUM: 138 mmol/L (ref 135–145)

## 2015-06-27 LAB — CARBOXYHEMOGLOBIN
Carboxyhemoglobin: 1.6 % — ABNORMAL HIGH (ref 0.5–1.5)
Methemoglobin: 0.5 % (ref 0.0–1.5)
O2 Saturation: 69.4 %
TOTAL HEMOGLOBIN: 13.4 g/dL — AB (ref 13.5–18.0)

## 2015-06-27 LAB — CULTURE, BLOOD (ROUTINE X 2)
Culture: NO GROWTH
Culture: NO GROWTH

## 2015-06-27 LAB — ANTITHROMBIN III: AntiThromb III Func: 96 % (ref 75–120)

## 2015-06-27 MED ORDER — ISOSORBIDE DINITRATE 10 MG PO TABS
40.0000 mg | ORAL_TABLET | Freq: Three times a day (TID) | ORAL | Status: DC
  Administered 2015-06-27 – 2015-06-30 (×10): 40 mg via ORAL
  Filled 2015-06-27 (×10): qty 4

## 2015-06-27 MED ORDER — HYDRALAZINE HCL 50 MG PO TABS
100.0000 mg | ORAL_TABLET | Freq: Three times a day (TID) | ORAL | Status: DC
  Administered 2015-06-27 – 2015-06-30 (×10): 100 mg via ORAL
  Filled 2015-06-27 (×10): qty 2

## 2015-06-27 NOTE — Progress Notes (Signed)
Advanced Heart Failure Rounding Note  PCP: Dr Laney Pastor Primary Cardiologist: Dr Haroldine Laws  Subjective:    Admitted 06/22/15 with worsening SOB accompanied by nausea and diaphoresis. SBP up to 190. Started on nitro drip.    Post cath started on milrinone for low output.  Creatinine peaked 2.3.  Feels good on milrinone. No dyspnea. Co-ox 69%. Creatinine 1.6-1.7 range. CVP remains low.    R&L heart cath 06/23/15 : Stable CAD with borderline significant lesions in midLAD and mid RCA but not felt to be critical/  Findings: Ao = 87/62 (71) LV = 81/9 RA = 2 RV = 20/0/2 PA = 24/12 (16) PCW = 4 Fick cardiac output/index = 3.1/1.8 Thermo CO/CI = 3.0/1.7 PVR = 3.9 WU Ao sat = 97% PA sat = 54%, 55%   Objective:   Weight Range: 64.8 kg (142 lb 13.7 oz) Body mass index is 22.37 kg/(m^2).   Vital Signs:   Temp:  [97.5 F (36.4 C)-98.8 F (37.1 C)] 97.5 F (36.4 C) (06/11 1201) Pulse Rate:  [89-101] 101 (06/11 0359) Resp:  [16-20] 16 (06/11 0801) BP: (117-177)/(88-150) 159/110 mmHg (06/11 1105) SpO2:  [97 %-99 %] 98 % (06/11 1201) Weight:  [64.8 kg (142 lb 13.7 oz)] 64.8 kg (142 lb 13.7 oz) (06/11 0359) Last BM Date: 06/25/15  Weight change: Filed Weights   06/25/15 0420 06/26/15 0300 06/27/15 0359  Weight: 63.6 kg (140 lb 3.4 oz) 63.2 kg (139 lb 5.3 oz) 64.8 kg (142 lb 13.7 oz)    Intake/Output:   Intake/Output Summary (Last 24 hours) at 06/27/15 1324 Last data filed at 06/27/15 1100  Gross per 24 hour  Intake  558.7 ml  Output    675 ml  Net -116.3 ml     Physical Exam: CVP 1 General: Well appearing. In chair on phone HEENT: normal Neck: supple.Carotids 2+ bilat; no bruits. No thyromegaly or nodule noted. JVP flat.  Cor: PMI laterally displaced. RRR. No M/G/R apprecaited Lungs: Decreased in the bases.   Abdomen: soft, NT, ND, no HSM. No bruits or masses. +BS  Extremities: no cyanosis, clubbing, rash, edema Neuro: alert & oriented x 3, cranial nerves  grossly intact. moves all 4 extremities w/o difficulty. Affect pleasant.  Telemetry: Reviewed, Sinus 90s with PVCs.   Labs: CBC  Recent Labs  06/26/15 0215 06/27/15 0445  WBC 5.8 6.0  HGB 13.6 12.7*  HCT 41.0 38.7*  MCV 99.0 100.8*  PLT 141* [id number]*   Basic Metabolic Panel  Recent Labs  06/26/15 0215 06/27/15 0445  NA 138 138  K 4.0 3.7  CL 107 103  CO2 21* 25  GLUCOSE 103* 161*  BUN 28* 31*  CREATININE 1.61* 1.71*  CALCIUM 9.5 9.2   Liver Function Tests No results for input(s): AST, ALT, ALKPHOS, BILITOT, PROT, ALBUMIN in the last 72 hours. No results for input(s): LIPASE, AMYLASE in the last 72 hours. Cardiac Enzymes No results for input(s): CKTOTAL, CKMB, CKMBINDEX, TROPONINI in the last 72 hours.  BNP: BNP (last 3 results)  Recent Labs  05/24/15 1159 06/21/15 2323  BNP >4500.0* >4500.0*    ProBNP (last 3 results) No results for input(s): PROBNP in the last 8760 hours.   D-Dimer No results for input(s): DDIMER in the last 72 hours. Hemoglobin A1C No results for input(s): HGBA1C in the last 72 hours. Fasting Lipid Panel No results for input(s): CHOL, HDL, LDLCALC, TRIG, CHOLHDL, LDLDIRECT in the last 72 hours. Thyroid Function Tests No results for input(s): TSH, T4TOTAL, T3FREE,  THYROIDAB in the last 72 hours.  Invalid input(s): FREET3  Other results:     Imaging/Studies:  Ct Abdomen Pelvis Wo Contrast  06/26/2015  CLINICAL DATA:  50 year old male with a history of heart failure. Preoperative left ventricular assist device. EXAM: CT CHEST, ABDOMEN AND PELVIS WITHOUT CONTRAST TECHNIQUE: Multidetector CT imaging of the chest, abdomen and pelvis was performed following the standard protocol without IV contrast. COMPARISON:  None. FINDINGS: CT CHEST Cardiac pacing device on left chest wall the streak artifact. Pacer leads traversed the left subclavian vein, terminating in the right atrium a and at the apex of the right ventricle. No axillary or  supraclavicular adenopathy. Unremarkable appearance of the thoracic inlet. Central airways patent with no endotracheal debris. Lymph nodes within the mediastinum, not enlarged. Heart size unremarkable. Dense calcifications/ stents involving the left anterior descending distribution. Calcifications/stents in distribution of the right coronary artery and circumflex coronary artery. No pericardial fluid/ thickening. No pleural effusion. No confluent airspace disease. No pneumothorax. No displaced fracture. Right upper extremity PICC, new from the plain film 06/21/2015 CT ABDOMEN AND PELVIS Unremarkable appearance of liver and spleen. Unremarkable appearance of bilateral adrenal glands. No peripancreatic or pericholecystic fluid or inflammatory changes. Gallbladder decompressed. No intrahepatic or extrahepatic biliary ductal dilatation. No intra-peritoneal free air or significant free-fluid. No abnormally dilated small bowel or colon. No transition point. No inflammatory changes of the mesenteries. Appendix is not visualized, however, no inflammatory changes are present adjacent to the cecum to indicate an appendicitis. No diverticular disease. Right Kidney/Ureter: No hydronephrosis. No nephrolithiasis. No perinephric stranding. Unremarkable course of the right ureter. Renal artery stent present within the mid right renal artery. Left Kidney/Ureter: No hydronephrosis. No nephrolithiasis. No perinephric stranding. Unremarkable course of the left ureter. Low-density cystic lesion of the left kidney measures 4 Hounsfield units with greatest diameter of 18 mm. Unremarkable appearance of the urinary bladder. No significant vascular calcification. No aneurysm or periaortic fluid identified. Scattered vascular calcifications of the mesenteric vessels and of the pelvic vessels. Musculoskeletal: No displaced fracture identified. Degenerative disc disease most pronounced at the L3-S1 levels. Vacuum disc phenomenon present. No  bony canal narrowing. Minimal degenerative changes of the hips. IMPRESSION: No acute finding. Left chest wall cardiac pacer/ AICD with 2 leads in place. Evidence of coronary artery disease, with stents/ calcifications in the distribution of the left anterior descending, circumflex, right coronary arteries. The patient is status post right renal artery stent, with stent placement in the mid right renal artery. Low-density cystic lesion of the left kidney is incompletely characterized, though most likely represents a Bosniak 1 cyst. Right upper extremity PICC Signed, Dulcy Fanny. Earleen Newport, DO [facility] Radiology Electronically Signed   By: Corrie Mckusick D.O.   On: 06/26/2015 07:10   Dg Orthopantogram  06/26/2015  CLINICAL DATA:  Evaluate for periapical abscesses. Initial encounter. EXAM: ORTHOPANTOGRAM/PANORAMIC COMPARISON:  None. FINDINGS: No definite periapical abscesses are seen. There appears to be a dental caries involving the remaining right maxillary molar, though this would be better assessed on dental exam. There is suggestion of mild loosening about the root of the left lateral mandibular incisor. The visualized mandible appears grossly intact. The visualized paranasal sinuses are well-aerated. IMPRESSION: No definite periapical abscess seen. Apparent dental caries involving the remaining right maxillary molar, though this would be better assessed on dental exam. Suggestion of mild loosening about the root of the left lateral mandibular incisor. Electronically Signed   By: Francoise Schaumann.D.  On: 06/26/2015 03:27   Ct Chest Wo Contrast  06/26/2015  CLINICAL DATA:  50 year old male with a history of heart failure. Preoperative left ventricular assist device. EXAM: CT CHEST, ABDOMEN AND PELVIS WITHOUT CONTRAST TECHNIQUE: Multidetector CT imaging of the chest, abdomen and pelvis was performed following the standard protocol without IV contrast. COMPARISON:   None. FINDINGS: CT CHEST Cardiac pacing device on left chest wall the streak artifact. Pacer leads traversed the left subclavian vein, terminating in the right atrium a and at the apex of the right ventricle. No axillary or supraclavicular adenopathy. Unremarkable appearance of the thoracic inlet. Central airways patent with no endotracheal debris. Lymph nodes within the mediastinum, not enlarged. Heart size unremarkable. Dense calcifications/ stents involving the left anterior descending distribution. Calcifications/stents in distribution of the right coronary artery and circumflex coronary artery. No pericardial fluid/ thickening. No pleural effusion. No confluent airspace disease. No pneumothorax. No displaced fracture. Right upper extremity PICC, new from the plain film 06/21/2015 CT ABDOMEN AND PELVIS Unremarkable appearance of liver and spleen. Unremarkable appearance of bilateral adrenal glands. No peripancreatic or pericholecystic fluid or inflammatory changes. Gallbladder decompressed. No intrahepatic or extrahepatic biliary ductal dilatation. No intra-peritoneal free air or significant free-fluid. No abnormally dilated small bowel or colon. No transition point. No inflammatory changes of the mesenteries. Appendix is not visualized, however, no inflammatory changes are present adjacent to the cecum to indicate an appendicitis. No diverticular disease. Right Kidney/Ureter: No hydronephrosis. No nephrolithiasis. No perinephric stranding. Unremarkable course of the right ureter. Renal artery stent present within the mid right renal artery. Left Kidney/Ureter: No hydronephrosis. No nephrolithiasis. No perinephric stranding. Unremarkable course of the left ureter. Low-density cystic lesion of the left kidney measures 4 Hounsfield units with greatest diameter of 18 mm. Unremarkable appearance of the urinary bladder. No significant vascular calcification. No aneurysm or periaortic fluid identified. Scattered  vascular calcifications of the mesenteric vessels and of the pelvic vessels. Musculoskeletal: No displaced fracture identified. Degenerative disc disease most pronounced at the L3-S1 levels. Vacuum disc phenomenon present. No bony canal narrowing. Minimal degenerative changes of the hips. IMPRESSION: No acute finding. Left chest wall cardiac pacer/ AICD with 2 leads in place. Evidence of coronary artery disease, with stents/ calcifications in the distribution of the left anterior descending, circumflex, right coronary arteries. The patient is status post right renal artery stent, with stent placement in the mid right renal artery. Low-density cystic lesion of the left kidney is incompletely characterized, though most likely represents a Bosniak 1 cyst. Right upper extremity PICC Signed, Dulcy Fanny. Earleen Newport, DO [facility] Radiology Electronically Signed   By: Corrie Mckusick D.O.   On: 06/26/2015 07:10    Latest Echo  Latest Cath   Medications:     Scheduled Medications: . aspirin EC  81 mg Oral Daily  . calcitRIOL  0.25 mcg Oral QODAY  . cholecalciferol  1,000 Units Oral Daily  . enoxaparin (LOVENOX) injection  40 mg Subcutaneous Q24H  . ezetimibe  10 mg Oral Daily  . feeding supplement (ENSURE ENLIVE)  237 mL Oral TID BM  . hydrALAZINE  75 mg Oral Q8H  . isosorbide dinitrate  20 mg Oral TID  . omega-3 acid ethyl esters  1 g Oral Daily  . rosuvastatin  40 mg Oral Daily  . sodium chloride flush  10-40 mL Intracatheter Q12H  . sodium chloride flush  3 mL Intravenous Q12H  . ticagrelor  90 mg Oral BID    Infusions: .  milrinone 0.25 mcg/kg/min (06/27/15 0800)    PRN Medications:     Assessment   1. Acute on chronic systolic CHF -> cardiogenic shock 2. CAD - MI x 4. Stents x 17.   Dist RCA-1 lesion, 100% stenosed. The lesion was previously treated with a stent (unknown type).  Dist RCA-2 lesion, 100% stenosed. The lesion was previously  treated with a stent (unknown type).  Prox RCA lesion, 70% stenosed. The lesion was previously treated with a stent (unknown type) and angioplasty.  Mid RCA lesion, 50% stenosed. The lesion was previously treated with a stent (unknown type).  Ost RPDA to RPDA lesion, 75% stenosed. The lesion was previously treated with a drug-eluting stent.  Mid LAD lesion, 70% stenosed.  Ost 2nd Diag lesion, 70% stenosed. 3. Hypertensive crisis 4. Acute on CKD stage III- creatinine baseline ~1.8 (improved) 5. Chest pain   Plan   Ding well on milrinone. Co-ox up. CVP down. Continue to hold lasix.   BP remains high. Will titrate hydralazine/isordil further today.  CAD severe but not critical at this point. HF major issue. Continue statin, Brilinta, ECASA.   Continue LVAD work up. He has been seen by Dr. Prescott Gum who agrees with VAD if social situation suitable. VAD team to see him and his sister tomorrow (she is from [facility]). If decision to prrceed with VAD is made will need to bridge Brillinta with aggrastat.    Length of Stay: 5   Barton Want MD  06/27/2015, 1:24 PM  Advanced Heart Failure Team Pager [phone] (M-F; [place])  Please contact [facility] for night-coverage after hours (4p -7a ) and weekends on [email or web address]

## 2015-06-28 DIAGNOSIS — Z515 Encounter for palliative care: Secondary | ICD-10-CM

## 2015-06-28 DIAGNOSIS — R570 Cardiogenic shock: Secondary | ICD-10-CM | POA: Insufficient documentation

## 2015-06-28 LAB — BASIC METABOLIC PANEL
Anion gap: 8 (ref 5–15)
BUN: 30 mg/dL — ABNORMAL HIGH (ref 6–20)
CALCIUM: 9.3 mg/dL (ref 8.9–10.3)
CHLORIDE: 104 mmol/L (ref 101–111)
CO2: 24 mmol/L (ref 22–32)
CREATININE: 1.59 mg/dL — AB (ref 0.61–1.24)
GFR calc non Af Amer: 49 mL/min — ABNORMAL LOW (ref >60)
GFR, EST AFRICAN AMERICAN: 57 mL/min — AB (ref >60)
GLUCOSE: 123 mg/dL — AB (ref 65–99)
Potassium: 3.6 mmol/L (ref 3.5–5.1)
Sodium: 136 mmol/L (ref 135–145)

## 2015-06-28 LAB — CBC
HEMATOCRIT: 36.6 % — AB (ref 39.0–52.0)
Hemoglobin: 12.2 g/dL — ABNORMAL LOW (ref 13.0–17.0)
MCH: 33.1 pg (ref 26.0–34.0)
MCHC: 33.3 g/dL (ref 30.0–36.0)
MCV: 99.2 fL (ref 78.0–100.0)
PLATELETS: 152 10*3/uL (ref 150–400)
RBC: 3.69 MIL/uL — ABNORMAL LOW (ref 4.22–5.81)
RDW: 12.8 % (ref 11.5–15.5)
WBC: 5 10*3/uL (ref 4.0–10.5)

## 2015-06-28 LAB — CARBOXYHEMOGLOBIN
CARBOXYHEMOGLOBIN: 1.3 % (ref 0.5–1.5)
Methemoglobin: 0.8 % (ref 0.0–1.5)
O2 SAT: 73.2 %
TOTAL HEMOGLOBIN: 12.6 g/dL — AB (ref 13.5–18.0)

## 2015-06-28 MED ORDER — POTASSIUM CHLORIDE CRYS ER 20 MEQ PO TBCR
20.0000 meq | EXTENDED_RELEASE_TABLET | Freq: Once | ORAL | Status: AC
  Administered 2015-06-28: 20 meq via ORAL
  Filled 2015-06-28: qty 1

## 2015-06-28 MED ORDER — SACUBITRIL-VALSARTAN 24-26 MG PO TABS
1.0000 | ORAL_TABLET | Freq: Two times a day (BID) | ORAL | Status: DC
  Administered 2015-06-28 – 2015-06-30 (×4): 1 via ORAL
  Filled 2015-06-28 (×5): qty 1

## 2015-06-28 NOTE — Progress Notes (Signed)
   06/28/15 1511  Clinical Encounter Type  Visited With Patient and family together;Health care provider  Visit Type Initial  Referral From Nurse;Patient  Spiritual Encounters  Spiritual Needs Literature  Stress Factors  Patient Stress Factors Health changes   Chaplain introduced and educated the patient on advanced directive paperwork. Chaplain helped facilitate the completion of patient's advanced directive. A copy of the patient's AD has been placed in his chart. Spiritual care services available as needed.   Jeri Lager, Chaplain 06/28/2015 3:12 PM

## 2015-06-28 NOTE — Progress Notes (Signed)
Advanced Heart Failure Rounding Note  PCP: Dr Laney Pastor Primary Cardiologist: Dr Haroldine Laws  Subjective:    Admitted 06/22/15 with worsening SOB accompanied by nausea and diaphoresis. SBP up to 190. Started on nitro drip.    Post cath started on milrinone for low output.  Creatinine peaked 2.3.   Co-ox 73.2% on milrinone 0.25 mcg/kg/min. Creatinine 1.6-1.7 range. CVP remains low at 2-3  R&L heart cath 06/23/15 : Stable CAD with borderline significant lesions in midLAD and mid RCA but not felt to be critical.  He feels good today. Excited to meet with VAD team.  Walking halls without difficulty.   Findings: Ao = 87/62 (71) LV = 81/9 RA = 2 RV = 20/0/2 PA = 24/12 (16) PCW = 4 Fick cardiac output/index = 3.1/1.8 Thermo CO/CI = 3.0/1.7 PVR = 3.9 WU Ao sat = 97% PA sat = 54%, 55%   Objective:   Weight Range: 140 lb 14.4 oz (63.912 kg) Body mass index is 22.06 kg/(m^2).   Vital Signs:   Temp:  [97.5 F (36.4 C)-99.3 F (37.4 C)] 99.3 F (37.4 C) (06/12 0735) Pulse Rate:  [108] 108 (06/11 2326) Resp:  [16-18] 18 (06/12 0735) BP: (147-171)/(88-131) 155/88 mmHg (06/12 0735) SpO2:  [97 %-99 %] 99 % (06/12 0735) Weight:  [140 lb 14.4 oz (63.912 kg)] 140 lb 14.4 oz (63.912 kg) (06/12 0545) Last BM Date: 06/25/15  Weight change: Filed Weights   06/26/15 0300 06/27/15 0359 06/28/15 0545  Weight: 139 lb 5.3 oz (63.2 kg) 142 lb 13.7 oz (64.8 kg) 140 lb 14.4 oz (63.912 kg)    Intake/Output:   Intake/Output Summary (Last 24 hours) at 06/28/15 0814 Last data filed at 06/28/15 0800  Gross per 24 hour  Intake  178.7 ml  Output    625 ml  Net -446.3 ml     Physical Exam: CVP 2-3 General: Well appearing. In bed on phone HEENT: normal Neck: supple. Carotids 2+ bilat; no bruits. No thyromegaly or nodule noted. JVP flat.  Cor: PMI laterally displaced. RRR. No M/G/R apprecaited Lungs: CTAB, normal effort Abdomen: soft, non-tender, non-distended, no HSM. No bruits or  masses. +BS  Extremities: no cyanosis, clubbing, rash, edema Neuro: alert & oriented x 3, cranial nerves grossly intact. moves all 4 extremities w/o difficulty. Affect pleasant.  Telemetry: Reviewed, Sinus 90s with PVCs.   Labs: CBC  Recent Labs  06/27/15 0445 06/28/15 0449  WBC 6.0 5.0  HGB 12.7* 12.2*  HCT 38.7* 36.6*  MCV 100.8* 99.2  PLT 146* 0000000   Basic Metabolic Panel  Recent Labs  06/27/15 0445 06/28/15 0449  NA 138 136  K 3.7 3.6  CL 103 104  CO2 25 24  GLUCOSE 161* 123*  BUN 31* 30*  CREATININE 1.71* 1.59*  CALCIUM 9.2 9.3   Liver Function Tests No results for input(s): AST, ALT, ALKPHOS, BILITOT, PROT, ALBUMIN in the last 72 hours. No results for input(s): LIPASE, AMYLASE in the last 72 hours. Cardiac Enzymes No results for input(s): CKTOTAL, CKMB, CKMBINDEX, TROPONINI in the last 72 hours.  BNP: BNP (last 3 results)  Recent Labs  05/24/15 1159 06/21/15 2323  BNP >4500.0* >4500.0*    ProBNP (last 3 results) No results for input(s): PROBNP in the last 8760 hours.   D-Dimer No results for input(s): DDIMER in the last 72 hours. Hemoglobin A1C No results for input(s): HGBA1C in the last 72 hours. Fasting Lipid Panel No results for input(s): CHOL, HDL, LDLCALC, TRIG, CHOLHDL, LDLDIRECT in  the last 72 hours. Thyroid Function Tests No results for input(s): TSH, T4TOTAL, T3FREE, THYROIDAB in the last 72 hours.  Invalid input(s): FREET3  Other results:     Imaging/Studies:  No results found.  Latest Echo  Latest Cath   Medications:     Scheduled Medications: . aspirin EC  81 mg Oral Daily  . calcitRIOL  0.25 mcg Oral QODAY  . cholecalciferol  1,000 Units Oral Daily  . enoxaparin (LOVENOX) injection  40 mg Subcutaneous Q24H  . ezetimibe  10 mg Oral Daily  . feeding supplement (ENSURE ENLIVE)  237 mL Oral TID BM  . hydrALAZINE  100 mg Oral Q8H  . isosorbide dinitrate  40 mg Oral TID  . omega-3 acid ethyl esters  1 g Oral Daily    . rosuvastatin  40 mg Oral Daily  . sodium chloride flush  10-40 mL Intracatheter Q12H  . sodium chloride flush  3 mL Intravenous Q12H  . ticagrelor  90 mg Oral BID    Infusions: . milrinone 0.25 mcg/kg/min (06/28/15 0000)    PRN Medications:     Assessment   1. Acute on chronic systolic CHF -> cardiogenic shock 2. CAD - MI x 4. Stents x 17.   Dist RCA-1 lesion, 100% stenosed. The lesion was previously treated with a stent (unknown type).  Dist RCA-2 lesion, 100% stenosed. The lesion was previously treated with a stent (unknown type).  Prox RCA lesion, 70% stenosed. The lesion was previously treated with a stent (unknown type) and angioplasty.  Mid RCA lesion, 50% stenosed. The lesion was previously treated with a stent (unknown type).  Ost RPDA to RPDA lesion, 75% stenosed. The lesion was previously treated with a drug-eluting stent.  Mid LAD lesion, 70% stenosed.  Ost 2nd Diag lesion, 70% stenosed. 3. Hypertensive crisis 4. Acute on CKD stage III- creatinine baseline ~1.8 (improved) 5. Chest pain   Plan   Feeling good on milrinone. Co-ox 73.2%. CVP 2-3 Continue to hold lasix.   Hydralazine/isordil increased yesterday. Now at Hydralazine 100 mg TID and Isordil 40 mg TID.  Remains in 130s this am, which is improved from previous.  Hesistant to add ACE/ARB with CKD. No BB with low output.   CAD severe but not critical at this point. HF major issue. Continue statin, Brilinta, ECASA.   Continue LVAD work up. He has been seen by Dr. Prescott Gum who agrees with VAD if social situation suitable.  VAD team to see him and his sister today (she is from [facility]).   If decision to proceed with VAD is made will need to bridge Brillinta with aggrastat.    Likely home in next 1-2 days pending VAD work up.   Length of Stay: 6   Shirley Friar PA-C 06/28/2015, 8:14 AM  Advanced Heart Failure Team Pager [phone] (M-F; 7a - 4p)  Please contact [facility] for  night-coverage after hours (4p -7a ) and weekends on [email or web address]  Patient seen and examined with Oda Kilts, PA-C. We discussed all aspects of the encounter. I agree with the assessment and plan as stated above.   Feels much better on milrinone. BP and HR remain high. Renal function stable. Will restart Entresto 24/26. Long talk with VAD team regarding his candidacy for LVAD and social situation not felt to be adequate at this time. Will plan trial of home milrinone. Will need to watch CAD closely as well.   Vyom Brass,MD 6:37 PM

## 2015-06-28 NOTE — Consult Note (Signed)
Consultation Note Date: 06/28/2015   Patient Name: Marvin Martin  DOB: 1965-02-18  MRN: [id number]  Age / Sex: 50 y.o., male  PCP: Marvin Morale, MD Referring Physician: Jolaine Artist, MD  Reason for Consultation: VAD eval  HPI/Patient Profile: 50 y.o. male  with PMH of hypertension, ischemic cardiomyopathy, hypertension, stage III CKD, 17 previous stents, last known EF 20-25%, St. Jude ICD, dyslipidemia and presented to ED with sudden onset of sharp, central chest discomfort that radiates to his left arm as well as associated dyspnea, nausea and diaphoresis with pain as severe as 8/10 that improved to 5/10 and gradually eased off with heparin gtt and NTG gtt here in the hospital.   Clinical Assessment and Goals of Care: I met today with Marvin Martin and his sister, Marvin Martin, at bedside. He moved here from [place] the end of 2016 but has little support or family here. His sister lives in [place]. She tells me that she is retired and works now to stay busy but says that she plans to come here to care for Marvin Martin if he is a candidate for VAD. They have received education and information from the team and are able to describe the basics of how this works and the care required. Although Marvin Martin says "after a little procedure I will feel better and live better." We discussed that it is a little more involved and certainly not a little procedure - discussed what to expect from surgery as far as vent, chest tubes, multiple IV pumps. We discuss risks of surgery. We also discuss goals of surgery and Marvin Martin says he looks forward to feeling better so that he can better explore his new home town and he is tired of being fatigued all the time.   We also discuss fears of surgery. Marvin Martin says that he had a lot of fear when he received his ICD and after being shocked he said he was so fearful that he needing counseling and had to accept this as  part of his body. We explained that he will have to realize this for VAD as well and this will be a huge change to his body image as he will actually be able to see the drive line and batteries connected. He says he is trying to prepare himself for this change and trying to accept this. He also has a fear of being mistaken as having a weapon (batteries) and that he will appear suspicious to police.   When we discuss his wishes and if he had ever considered his options should he reach EOL he tells me that he has peace with whatever happens. "I just keep smiling and focus on today." He seems to have a good outlook on life and says he does not fear dying. He is still legally married but separated and he tells me that he wants his sister, Marvin Martin, here at bedside to be his HCPOA. We discuss the importance of finalizing this because his wife would be his legal surrogate unless otherwise documented. He also  expressed interest in Living Will - I provided documents and consulted chaplain services to help him make these official.   HCPOA - not formalized but he desires his sister Marvin Martin.     Code Status/Advance Care Planning:  Full code   Symptom Management:   Fatigue: Work up for VAD in process.    Psycho-social/Spiritual:   Desire for further Chaplaincy support:yes  Additional Recommendations: Advance Dircetive completion  Prognosis:   Unable to determine  Discharge Planning: Home with Home Health      Primary Diagnoses: Present on Admission:  . Acute on chronic systolic congestive heart failure ([place]) . Benign essential HTN . CAD (coronary artery disease) . CKD (chronic kidney disease) stage 3, GFR 30-59 ml/min . Hypertensive heart disease . Hyperlipidemia . Acute on chronic systolic CHF (congestive heart failure) ([place])  I have reviewed the medical record, interviewed the patient and family, and examined the patient. The following aspects are pertinent.  Past Medical History  Diagnosis  Date  . Chronic systolic CHF (congestive heart failure) ([place])     a. 01/2015 Echo: EF 20-25%, antlat, inflat AK.  [name] Hyperlipidemia   . Coronary artery disease     a. s/p MI x 4;  b. Reported h/o 17 stents with recurrent ISR;  c. 02/2014 Cath ([place]): PCI to unknown vessel;  d. 01/2015 MV: EF 18%, large scar, no ischemia; e. 03/2015 PCI: LM nl, LAD nl, D2 patent stent, RI patent stent, LCX patent stents p/m, RCA 95p ISR (PTCA), 18m 100d into RPL CTO, RPDA 90 (2.5x16 Synergy DES).  . Hypertensive heart disease   . Ischemic cardiomyopathy     a. s/p SJM ICD 2007 w/ gen change in 2012; b. 12/2014 CPX Test: mild to mod HF limitation w/ pVO2 20.3 and nl slope;  c. 01/2015 Echo: EF 20-25%.  . CKD (chronic kidney disease), stage III    Social History   Social History  . Marital Status: Legally Separated    Spouse Name: N/A  . Number of Children: N/A  . Years of Education: N/A   Social History Main Topics  . Smoking status: Never Smoker   . Smokeless tobacco: None  . Alcohol Use: No  . Drug Use: No  . Sexual Activity: No   Other Topics Concern  . None   Social History Narrative   [place]  [place]   Family History  Problem Relation Age of Onset  . Cancer Mother     died when pt was 89  She had a h/o heart dzs as well.  . Hypertension Mother   . Hyperlipidemia Father     Father died when pt was age 50- he believes that he had a cardiac hx.  .Marland KitchenCAD Sister    Scheduled Meds: . aspirin EC  81 mg Oral Daily  . calcitRIOL  0.25 mcg Oral QODAY  . cholecalciferol  1,000 Units Oral Daily  . enoxaparin (LOVENOX) injection  40 mg Subcutaneous Q24H  . ezetimibe  10 mg Oral Daily  . feeding supplement (ENSURE ENLIVE)  237 mL Oral TID BM  . hydrALAZINE  100 mg Oral Q8H  . isosorbide dinitrate  40 mg Oral TID  . omega-3 acid ethyl esters  1 g Oral Daily  . rosuvastatin  40 mg Oral Daily  . sodium chloride flush  10-40 mL Intracatheter Q12H  . sodium chloride  flush  3 mL Intravenous Q12H  . ticagrelor  90  mg Oral BID   Continuous Infusions: . milrinone 0.25 mcg/kg/min (06/28/15 0000)   PRN Meds:.sodium chloride, acetaminophen, nitroGLYCERIN, ondansetron (ZOFRAN) IV, ondansetron, sodium chloride flush, sodium chloride flush Medications Prior to Admission:  Prior to Admission medications   Medication Sig Start Date End Date Taking? Authorizing Provider  Alirocumab (PRALUENT) 75 MG/ML SOPN Inject 1 pen into the skin every 14 (fourteen) days. 04/15/15  Yes Marvin Artist, MD  allopurinol (ZYLOPRIM) 100 MG tablet Take 100 mg by mouth 2 (two) times daily.    Yes Historical Provider, MD  amLODipine (NORVASC) 10 MG tablet Take 10 mg by mouth daily.   Yes Historical Provider, MD  aspirin 81 MG tablet Take 81 mg by mouth daily.   Yes Historical Provider, MD  calcitRIOL (ROCALTROL) 0.25 MCG capsule Take 0.25 mcg by mouth every other day. Take on Monday, Wednesday and Friday   Yes Historical Provider, MD  carvedilol (COREG) 25 MG tablet Take 1 tablet (25 mg total) by mouth 2 (two) times daily with a meal. 06/03/15  Yes Marvin Morale, MD  cholecalciferol (VITAMIN D) 1000 UNITS tablet Take 1,000 Units by mouth daily.   Yes Historical Provider, MD  ezetimibe (ZETIA) 10 MG tablet Take 10 mg by mouth daily.   Yes Historical Provider, MD  furosemide (LASIX) 40 MG tablet Take 40 mg by mouth daily.    Yes Historical Provider, MD  hydrALAZINE (APRESOLINE) 50 MG tablet TAKE 1 TABLET 3 TIMES A DAY 04/26/15  Yes Marvin Artist, MD  isosorbide dinitrate (ISORDIL) 20 MG tablet TAKE 1 TABLET 3 TIMES A DAY 04/26/15  Yes Marvin Artist, MD  nitroGLYCERIN (NITROSTAT) 0.4 MG SL tablet Place 1 tablet (0.4 mg total) under the tongue every 5 (five) minutes as needed for chest pain. Reported on 04/12/2015 06/03/15  Yes Marvin Morale, MD  omega-3 acid ethyl esters (LOVAZA) 1 G capsule Take 1 g by mouth daily.    Yes Historical Provider, MD  ondansetron (ZOFRAN) 4 MG tablet Take 4  mg by mouth every 8 (eight) hours as needed for nausea or vomiting. Reported on 05/24/2015   Yes Historical Provider, MD  rosuvastatin (CRESTOR) 40 MG tablet Take 40 mg by mouth daily.   Yes Historical Provider, MD  sacubitril-valsartan (ENTRESTO) 97-103 MG Take 1 tablet by mouth 2 (two) times daily. 02/09/15  Yes Marvin Artist, MD  spironolactone (ALDACTONE) 25 MG tablet Take 25 mg by mouth daily.   Yes Historical Provider, MD  ticagrelor (BRILINTA) 90 MG TABS tablet Take 1 tablet (90 mg total) by mouth 2 (two) times daily. 04/15/15  Yes Marvin Artist, MD   No Known Allergies Review of Systems  Constitutional: Positive for activity change, appetite change and fatigue.  Respiratory: Positive for shortness of breath.     Physical Exam  Constitutional: He is oriented to person, place, and time. He appears well-developed. He is not intubated.  HENT:  Head: Normocephalic and atraumatic.  Cardiovascular: Normal rate.   Pulmonary/Chest: No tachypnea. He is not intubated. No respiratory distress.  Abdominal: Normal appearance.  Neurological: He is alert and oriented to person, place, and time.    Vital Signs: BP 178/114 mmHg  Pulse 108  Temp(Src) 98 F (36.7 C) (Oral)  Resp 24  Ht 5' 7" (1.702 m)  Wt 63.912 kg (140 lb 14.4 oz)  BMI 22.06 kg/m2  SpO2 99% Pain Assessment: No/denies pain POSS *See Group Information*: 1-Acceptable,Awake and alert Pain Score: 0-No pain   SpO2: SpO2: 99 %  O2 Device:SpO2: 99 % O2 Flow Rate: .   IO: Intake/output summary:  Intake/Output Summary (Last 24 hours) at 06/28/15 1324 Last data filed at 06/28/15 1100  Gross per 24 hour  Intake  372.3 ml  Output    825 ml  Net -452.7 ml    LBM: Last BM Date: 06/25/15 Baseline Weight: Weight: 63.73 kg (140 lb 8 oz) Most recent weight: Weight: 63.912 kg (140 lb 14.4 oz)     Palliative Assessment/Data:     Time In: 1230 Time Out: 1330 Time Total: 38mn Greater than 50%  of this time was spent  counseling and coordinating care related to the above assessment and plan.  Signed by: PPershing Proud NP   Please contact Palliative Medicine Team phone at [phone] questions and concerns.  For individual provider: See AShea Evans

## 2015-06-28 NOTE — Progress Notes (Signed)
CARDIAC REHAB PHASE I   PRE:  Rate/Rhythm: 119 ST  BP:  Supine:   Sitting: 170/107  Standing:    SaO2: 98%RA  MODE:  Ambulation: 1030 ft 6 minute walk test POST:  Rate/Rhythm: 125 ST few PVCs  BP:  Supine:   Sitting: 177/106  Standing:    SaO2: 98%RA 1340-1410 Pt walked 1030 ft with steady pace and no rest stops in 6 minutes. Denied SOB and no DOE. Tolerated well. BP elevated before walk and maintained about same. Graylon Good, RN BSN  06/28/2015 2:03 PM

## 2015-06-28 NOTE — Care Management Important Message (Signed)
Important Message  Patient Details  Name: Marvin Martin MRN: [id number] Date of Birth: [birth year]   Medicare Important Message Given:  Yes    [name] 06/28/2015, 1:44 PM

## 2015-06-29 ENCOUNTER — Inpatient Hospital Stay (HOSPITAL_COMMUNITY): Payer: Medicare Other

## 2015-06-29 DIAGNOSIS — I10 Essential (primary) hypertension: Secondary | ICD-10-CM

## 2015-06-29 LAB — CARBOXYHEMOGLOBIN
CARBOXYHEMOGLOBIN: 1.9 % — AB (ref 0.5–1.5)
Carboxyhemoglobin: 1.4 % (ref 0.5–1.5)
Methemoglobin: 0.7 % (ref 0.0–1.5)
Methemoglobin: 0.8 % (ref 0.0–1.5)
O2 Saturation: 73.4 %
O2 Saturation: 70.2 %
Total hemoglobin: 12.7 g/dL — ABNORMAL LOW (ref 13.5–18.0)
Total hemoglobin: 13.6 g/dL (ref 13.5–18.0)

## 2015-06-29 LAB — BASIC METABOLIC PANEL
ANION GAP: 7 (ref 5–15)
BUN: 33 mg/dL — ABNORMAL HIGH (ref 6–20)
CALCIUM: 9.5 mg/dL (ref 8.9–10.3)
CO2: 26 mmol/L (ref 22–32)
Chloride: 104 mmol/L (ref 101–111)
Creatinine, Ser: 1.76 mg/dL — ABNORMAL HIGH (ref 0.61–1.24)
GFR calc Af Amer: 50 mL/min — ABNORMAL LOW (ref >60)
GFR calc non Af Amer: 43 mL/min — ABNORMAL LOW (ref >60)
GLUCOSE: 121 mg/dL — AB (ref 65–99)
Potassium: 4.1 mmol/L (ref 3.5–5.1)
Sodium: 137 mmol/L (ref 135–145)

## 2015-06-29 LAB — CBC
HCT: 36.4 % — ABNORMAL LOW (ref 39.0–52.0)
Hemoglobin: 11.9 g/dL — ABNORMAL LOW (ref 13.0–17.0)
MCH: 32.8 pg (ref 26.0–34.0)
MCHC: 32.7 g/dL (ref 30.0–36.0)
MCV: 100.3 fL — AB (ref 78.0–100.0)
PLATELETS: 179 10*3/uL (ref 150–400)
RBC: 3.63 MIL/uL — ABNORMAL LOW (ref 4.22–5.81)
RDW: 12.8 % (ref 11.5–15.5)
WBC: 5.4 10*3/uL (ref 4.0–10.5)

## 2015-06-29 MED ORDER — AMLODIPINE BESYLATE 5 MG PO TABS
5.0000 mg | ORAL_TABLET | Freq: Every day | ORAL | Status: DC
  Administered 2015-06-29: 5 mg via ORAL
  Filled 2015-06-29: qty 1

## 2015-06-29 MED ORDER — MILRINONE LACTATE IN DEXTROSE 20-5 MG/100ML-% IV SOLN
0.1250 ug/kg/min | INTRAVENOUS | Status: DC
  Administered 2015-06-29 (×2): 0.125 ug/kg/min via INTRAVENOUS
  Filled 2015-06-29: qty 100

## 2015-06-29 NOTE — Progress Notes (Signed)
CSW met at bedside with patient and sister Marvin Martin to complete LVAD assessment. Patient requested assistance with completion of Advanced Directive. CSW referred patient to Chaplain for completion of AD. Full LVAD assessment to follow. Raquel Sarna, LCSW [phone]

## 2015-06-29 NOTE — Progress Notes (Signed)
Preliminary results by tech - Renal Duplex Completed. Right renal artery stent appeared patent with no evidence of significant stenosis. Left renal artery demonstrated normal patency. Oda Cogan, BS, RDMS, RVT

## 2015-06-29 NOTE — Progress Notes (Signed)
Advanced Heart Failure Rounding Note  PCP: Dr Laney Pastor Primary Cardiologist: Dr Haroldine Laws  Subjective:    Admitted 06/22/15 with worsening SOB accompanied by nausea and diaphoresis. SBP up to 190. Started on nitro drip.    Post cath started on milrinone for low output.  Creatinine peaked 2.3.  Yesterday started on entresto. Creatinine up from 1.59>1.76. Remains on milrinone 0.25 mcg. Today Co-OX 73%.  R&L heart cath 06/23/15 : Stable CAD with borderline significant lesions in midLAD and mid RCA but not felt to be critical.  Findings: Ao = 87/62 (71) LV = 81/9 RA = 2 RV = 20/0/2 PA = 24/12 (16) PCW = 4 Fick cardiac output/index = 3.1/1.8 Thermo CO/CI = 3.0/1.7 PVR = 3.9 WU Ao sat = 97% PA sat = 54%, 55%   Objective:   Weight Range: 141 lb 1.5 oz (64 kg) Body mass index is 22.09 kg/(m^2).   Vital Signs:   Temp:  [97.9 F (36.6 C)-98.1 F (36.7 C)] 98.1 F (36.7 C) (06/13 0328) Pulse Rate:  [108-115] 111 (06/13 0328) Resp:  [18-24] 18 (06/12 1635) BP: (140-185)/(109-127) 147/109 mmHg (06/13 0328) SpO2:  [98 %-100 %] 100 % (06/13 0328) Weight:  [141 lb 1.5 oz (64 kg)] 141 lb 1.5 oz (64 kg) (06/13 0500) Last BM Date: 06/25/15  Weight change: Filed Weights   06/27/15 0359 06/28/15 0545 06/29/15 0500  Weight: 142 lb 13.7 oz (64.8 kg) 140 lb 14.4 oz (63.912 kg) 141 lb 1.5 oz (64 kg)    Intake/Output:   Intake/Output Summary (Last 24 hours) at 06/29/15 0802 Last data filed at 06/29/15 0500  Gross per 24 hour  Intake  831.7 ml  Output    450 ml  Net  381.7 ml     Physical Exam: CVP 1 General: Well appearing. In bed HEENT: normal Neck: supple. Carotids 2+ bilat; no bruits. No thyromegaly or nodule noted. JVP flat.  Cor: PMI laterally displaced. RRR. No M/G/R apprecaited Lungs: CTAB, normal effort Abdomen: soft, non-tender, non-distended, no HSM. No bruits or masses. +BS  Extremities: no cyanosis, clubbing, rash, edema Neuro: alert & oriented x 3,  cranial nerves grossly intact. moves all 4 extremities w/o difficulty. Affect pleasant.  Telemetry: Reviewed, Sinus 90-100s with PVCs.   Labs: CBC  Recent Labs  06/28/15 0449 06/29/15 0350  WBC 5.0 5.4  HGB 12.2* 11.9*  HCT 36.6* 36.4*  MCV 99.2 100.3*  PLT 152 0000000   Basic Metabolic Panel  Recent Labs  06/28/15 0449 06/29/15 0350  NA 136 137  K 3.6 4.1  CL 104 104  CO2 24 26  GLUCOSE 123* 121*  BUN 30* 33*  CREATININE 1.59* 1.76*  CALCIUM 9.3 9.5   Liver Function Tests No results for input(s): AST, ALT, ALKPHOS, BILITOT, PROT, ALBUMIN in the last 72 hours. No results for input(s): LIPASE, AMYLASE in the last 72 hours. Cardiac Enzymes No results for input(s): CKTOTAL, CKMB, CKMBINDEX, TROPONINI in the last 72 hours.  BNP: BNP (last 3 results)  Recent Labs  05/24/15 1159 06/21/15 2323  BNP >4500.0* >4500.0*    ProBNP (last 3 results) No results for input(s): PROBNP in the last 8760 hours.   D-Dimer No results for input(s): DDIMER in the last 72 hours. Hemoglobin A1C No results for input(s): HGBA1C in the last 72 hours. Fasting Lipid Panel No results for input(s): CHOL, HDL, LDLCALC, TRIG, CHOLHDL, LDLDIRECT in the last 72 hours. Thyroid Function Tests No results for input(s): TSH, T4TOTAL, T3FREE, THYROIDAB in the  last 72 hours.  Invalid input(s): FREET3  Other results:     Imaging/Studies:  No results found.  Latest Echo  Latest Cath   Medications:     Scheduled Medications: . aspirin EC  81 mg Oral Daily  . calcitRIOL  0.25 mcg Oral QODAY  . cholecalciferol  1,000 Units Oral Daily  . enoxaparin (LOVENOX) injection  40 mg Subcutaneous Q24H  . ezetimibe  10 mg Oral Daily  . feeding supplement (ENSURE ENLIVE)  237 mL Oral TID BM  . hydrALAZINE  100 mg Oral Q8H  . isosorbide dinitrate  40 mg Oral TID  . omega-3 acid ethyl esters  1 g Oral Daily  . rosuvastatin  40 mg Oral Daily  . sacubitril-valsartan  1 tablet Oral BID  . sodium  chloride flush  10-40 mL Intracatheter Q12H  . sodium chloride flush  3 mL Intravenous Q12H  . ticagrelor  90 mg Oral BID    Infusions: . milrinone 0.25 mcg/kg/min (06/28/15 2000)    PRN Medications:     Assessment   1. Acute on chronic systolic CHF -> cardiogenic shock 2. CAD - MI x 4. Stents x 17.   Dist RCA-1 lesion, 100% stenosed. The lesion was previously treated with a stent (unknown type).  Dist RCA-2 lesion, 100% stenosed. The lesion was previously treated with a stent (unknown type).  Prox RCA lesion, 70% stenosed. The lesion was previously treated with a stent (unknown type) and angioplasty.  Mid RCA lesion, 50% stenosed. The lesion was previously treated with a stent (unknown type).  Ost RPDA to RPDA lesion, 75% stenosed. The lesion was previously treated with a drug-eluting stent.  Mid LAD lesion, 70% stenosed.  Ost 2nd Diag lesion, 70% stenosed. 3. Hypertensive crisis 4. Acute on CKD stage III- creatinine baseline ~1.8 (improved) 5. Chest pain   Plan   Todays CO-OX 73%. Cut milrinone to 0.125 mcg.  CVP 1. No diuretics for now. With addition of entresto creatinine up from 1.5>1.7. Continue entresto 24-26 mg twice a day. Continue hydralazine 100 mg tid/isordil 40 mg tid.     Remains hypertensive. CT of abd - no evidence of hydronephrosis. Has R renal artery stent. Add 5 mg amlodipine.   CAD severe but not critical at this point. HF major issue. Continue statin, Brilinta, ECASA.   VAD not currently an option If decision to proceed with VAD is made will need to bridge Brillinta with aggrastat.    Not currently a candidate for LVAD due to social barriers.    Home 24-48 hours. [place] set up for home milrinone. Has ST Jude ICD.    Length of Stay: [place] NP-C  06/29/2015, 8:02 AM  Advanced Heart Failure Team Pager [phone] (M-F; 7a - 4p)  Please contact [facility] for night-coverage after hours (4p -7a ) and weekends on [email or web address]  Patient  seen and examined with Darrick Grinder, NP. We discussed all aspects of the encounter. I agree with the assessment and plan as stated above.   BP and HR remain high. Agree with turning milrinone down. Will add back amlodipine. Check renal artery u/s to reassess for RAS.   Long discussion about VAD team decision not to offer VAD at this point due to social issues.  Will need home milrinone.   Lyndon Chenoweth,MD 9:11 AM

## 2015-06-29 NOTE — Progress Notes (Signed)
LVAD Initial Psychosocial Screening  Date/Time Initiated: 06/28/15 10am Referral Source:  Janene Madeira, [place] Coordinator Referral Reason:  LVAD Implantation Source of Information:  Patient, Lue sister, patient record  Demographics Name:  [facility] Address:  [place] [address] Home phone: [phone]    Cell: [phone] Marital Status:  Legally Separated  Faith:  [employer] Primary Language:  English SS:  [id number]   DOB:  February 15, 1965  Medical & Follow-up Adherence to Medical regimen/INR checks:  compliant Medication adherence:  compliant Physician/Clinic Appointment Attendance:  compliant   Advance Directives: Do you have a Living Will or Medical POA?  No Would you like to complete a Living Will and Medical POA prior to surgery?  Yes- CSW referred patient to the Pastoral Care Department for completion of AD. Do you have Goals of Care?  Discussed  Have you had a consult with the Palliative Care Team at [facility]? Pending this afternoon  Psychological Health Appearance:  Well groomed in hospital gown Mental Status:  Alert and oriented Eye Contact:  good Thought Content:  Clear and appropriate Speech:  clear Mood:  Very positive  Affect:  Pleasant Insight:  good Judgement: sound Interaction Style:  Positive   Family/Social Information Who lives in your home? Name:   Relationship:   Lives alone  Other family members/support persons in your life? Name:   Relationship:   Shirlyn Goltz Sister- lives in [place] (4.5 hrs away) [place]- lives in [place] Carmelina Peal Sister- lives in [place]- lives in [place] (1.5 hrs away) [phone]  Caregiving Needs Who is the primary caregiver? [place] status:  Pretty good Do you drive?  yes Do you work?  Yes [facility]) also retired Engineer, structural Physical Limitations:  none Do you have other care  giving responsibilities?  50yo neice Contact number: [phone]  (Tentative Back-Up Caregivers- Patient will confirm) Who is the secondary caregiver? [place] status:  good Do you drive?  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  none Contact number: [phone]  Who is the secondary caregiver? [place] status:  DM-good Do you drive?  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  none Contact number: [place] you own or rent your home? rent Current mortgage/rent: $215.00 Number of steps into the home? 1 step in the back How many levels in the home? 2 levels Assistive devices in the home? Walking cane Electrical needs for LVAD (3 prong outlets) yes Second hand smoke exposure in the home? no Travel distance from [employer]? 7 minutes Self-care: independent Ambulation: independent  Community Are you active with community agencies/resources/homecare? no Are you active in a church, synagogue, mosque or other faith based community? none What other sources do you have for spiritual support? no Are you active in any clubs or social organizations? none What do you do for fun?  Hobbies?  Interests? Relax- just moved to the area in November, 2016  Education/Work Information What is the last grade of school you completed?  12th Preferred method of learning?  Written, Verbal and Hands on Do you have any problems with reading or writing?  none Are you currently employed? no   When were you last employed? 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 If  yes, what type of employment do you hope to find? Are you interested in job training or learning new skills? no Did you serve in the [employer]?  If so, what branch? none  Financial Information What is your source of income? SSD- 2006 started benefits- $1,007.00 Do you have difficulty meeting your monthly expenses? no Can you budget for the monthly cost for dressing supplies post procedure?  Discussed need Primary Health insurance:  Medicare Secondary Insurance: none Prescription plan: [employer] [facility] What are your prescription co-pays? varies Do you use mail order for your prescriptions?  Will consider Have you ever had to refuse medication due to cost?  no Have you applied for Medicaid?  considering Have you applied for Social Security Disability (SSI) - receives SSD  Medical Information Briefly describe why you are here for evaluation: Patient reports he started with an MI in 2003 and has had a total of 18 stents since 2003. Do you have a PCP or other medical provider?  Arnoldo Morale, MD Are you able to complete your ADL's? independent Do you have a history of trauma, physical, emotional, or sexual abuse? no Do you have any family history of heart problems? Sister and father with heart disease Do you smoke now or past usage?  never Do you drink alcohol now or past usage?  never   Are you currently using illegal drugs or misuse of medication or past usage?  never Have you ever been treated for substance abuse? never      If yes, where and when did you receive treatment? [facility] History How have you been feeling in the past year? Pretty good although have had congestion Have you ever had any problems with depression, anxiety or other mental health issues?none Do you see a counselor, psychiatrist or therapist?  none If you are currently experiencing problems are you interested in talking with a professional? none Have you or are you taking medications for  anxiety/depression or any mental health concerns?  none Current Medications: n/a What are your coping strategies under stressful situations? "not too much stress now just try and relax" Are there any other stressors in your life? Patient stated that his wife who he is separated would not sign divorce papers and caused a great deal of stress while he was living in [place]. He also stated that he misses his children (ages- 2,11,13) Have you had any past or current thoughts of suicide? "no way" How many hours do you sleep at night? 6-7 hours How is your appetite? Sometimes ok- no taste buds Would you be interested in attending the LVAD support group? Yes  PHQ2 Depression Scale:0 Hospital Anxiety and Depression Screen (HADS) score: 3  Legal Do you currently have any legal issues/problems?  none Have you had any legal issues/problems in the past?  none Do you have a Durable POA?  no   Plan for VAD Implementation Do you know and understand what happens during the VAD surgery? Patient and sister verbalizes open heart surgery, VAD implant and post recovery.  What do you know about the risks and side effect associated with VAD surgery? Patient verbalizes understanding of infection, stroke and death as possible risk factors. Explain what will happen right  after surgery:   Patient understands transfer to ICU from OR and will be intubated and remain in ICU until stable enough to be transferred to regular floor. Discussed hospitalization and recovery as well as post recovery. What is your plan for transportation for the first 8 weeks post-surgery? (Patients are not recommended to drive post-surgery for 8 weeks)Patient at the moment has his sister from [facility] (4.5 hours away) as primary caregiver committed to 24/7 for the first two weeks. Unsure of availability of caregivers beyond intial two weeks post op.  Driver:   Gaye Pollack sister (unclear about week 3 and ongoing post hospitalization)  Do you have airbags in your  vehicle?  Yes There is a risk of discharging the device if the airbag were to deploy. Discussed and patient will consider options of disarming. What do you know about your diet post-surgery?   Heart healthy How do you plan to monitor your medications, current and future?   Self pours How do you plan to complete ADL's post-surgery?  Initially, patient will have sister Gaye Pollack providing care and support Will it be difficult to ask for help from your caregivers?  No  Please explain what you hope will be improved about your life as a result of receiving the LVAD? "I will be able to move around and do more" Please tell me your biggest concern or fear about living with the LVAD?  "out in the heat" How do you cope with your concerns and fears?  "deal with it" Please explain your understanding of how their body will change?  Patient states "sometimes I ask myself why me?" Are you worried about these changes? A little Do you see any barriers to your surgery or follow-up? none  Understanding of LVAD Patient states understanding of the following: Surgical procedures and risks, Electrical need for LVAD (3 prong outlets), Safety precautions with LVAD (water, etc.), LVAD daily self-care (dressing changes, computer check, extra supplies), Outpatient follow up (LVAD clinic appts, monitoring blood thinners) and Need for Emergency Planning  Discussed and Reviewed with Patient and Caregiver  Patient's current level of motivation to prepare for LVAD:  Patient verbalizes understanding of additional information needed to determine appropriate plan of care. Patient still unsure of post acute recovery needs and long term caregiver needs. Patient appears motivated for LVAD although will discuss further with family to develop plan of care.  Education provided to patient/family/caregiver:   Caregiver role and responsibiltiy, Financial planning for LVAD, Role of Clinical Social Worker and Signs of Depression and  Anxiety  Comments:  Caregiver role discussed with both patient and sister and need for ongoing dressing changes and care needs for life of the VAD. Currently, patient unsure of local caregiver and transport options to assist with ongoing care needs.  Caregiver questions Please explain what you hope will be improved about your life and loved one's life as a result of receiving the LVAD? "improved quality of life"  What is your biggest concern or fear about caregiving with an LVAD patient?  "getting him through surgery" What is your plan for availability to provide care 24/7 x2 weeks post op and dressing changes ongoing?  Patient's sister Gaye Pollack states she will take off from work and stay as long as needed. She lives 4.5 hours away and has custody of an 56 yo niece who lives with her in [place]. Who is the relief/backup caregiver and what is their availability?  TBD Preferred method of learning? Written, Verbal and Hands on  Do you  drive? yes How do you handle stressful situations?  "losing my hair" Denies any mental illness/medications  Do you think you can do this? Yes Is there anything that concerns about caregiving?  No- hope for a speedy recovery Do you provide caregiving to anyone else? 50 yo neice HADS [facility] Anxiety and Depression Screen score) Caregiver:  3  Comments:  Patient's sister Gaye Pollack appears very supportive and willing to be primary caregiver. Unfortunately, she lives 4.5 hours away with caregiving responsibilities of an 50yo niece. She states she will stay with patient 24/7 initially and "as long as needed" but has a PT job and an 50 yo who will return to [facility] eventually. It appears unrealistic for her to commute weekly for transport/dressing change needs ongoing.  Clinical Interventions Needed:    CSW spoke at length with patient about concerns of dressing changes, transport needs after his sister returns to [place] also discussed need for back up caregiver and to consider someone local  for support given he is new to the area and has no local family or friends. CSW referred to Pastoral Care for completion of AD.  Clinical Impressions/Recommendations:   Patient is a 50yo male who is separated from his wife (since November 2016) and has 3 children ages (2, 72, 4) all who live in [place] with wife. Patient reports a stressful marriage for sometime and he pursued subsidized housing options 3 years ago to "get away and reduce stress". [place] [facility] called with available unit in November 2016. Patient reports he chose [facility] because of a cousin(lives in [place]) who stated "nice area to live a stress free". Patient reported he tried to make the marriage work and attended counseling but wife refused to go. He states she couldn't deal with the fact "that I wasn't the man I once was because of my declining health". Patient had started work up for transplant although encouraged to reduce stress from marriage and home life. Patient reports MD in [facility] encouraged transfer/move to this area as he could continue transplant work up with [facility].  Patient states I need to focus on me in order to be around for my kids. Patient does not want to leave [place] area as he likes his new home and has adjusted to life here in [place]. Patient reports he is compliant with his medical regimen. His heart disease began in 2003 and has had 18 total stents. He never used tobacco, alcohol and never used illegal substances. He denies any mental health history and tries to relax when under stressful conditions. He receives SSD and has [employer]. 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 Patient has family in [facility] although they "do not have the same lifestyle as me and not helpful". Patient's family all live out of state in [place] and [place]. His sister Gaye Pollack has agreed to be primary caregiver although 4.5 hours away will limit her ability to provide ongoing  dressing changes and transport needs. CSW discussed need for confirmation on back up caregivers as well. Patient will need to consider caregivers in the local area to assist with transport/caregiving needs ongoing. Patient verbalizes understanding and will continue to explore options and work closely with CSW to determine plan of care for possible LVAD implantation. CSW unable to finalize assessment and will continue to monitor and assess as needed.   Louann Liv, [place]

## 2015-06-30 LAB — CBC
HEMATOCRIT: 39.1 % (ref 39.0–52.0)
HEMOGLOBIN: 12.7 g/dL — AB (ref 13.0–17.0)
MCH: 32.8 pg (ref 26.0–34.0)
MCHC: 32.5 g/dL (ref 30.0–36.0)
MCV: 101 fL — ABNORMAL HIGH (ref 78.0–100.0)
Platelets: 214 10*3/uL (ref 150–400)
RBC: 3.87 MIL/uL — ABNORMAL LOW (ref 4.22–5.81)
RDW: 12.9 % (ref 11.5–15.5)
WBC: 5.5 10*3/uL (ref 4.0–10.5)

## 2015-06-30 LAB — CREATININE, SERUM
Creatinine, Ser: 1.91 mg/dL — ABNORMAL HIGH (ref 0.61–1.24)
GFR calc Af Amer: 46 mL/min — ABNORMAL LOW (ref >60)
GFR, EST NON AFRICAN AMERICAN: 39 mL/min — AB (ref >60)

## 2015-06-30 LAB — CARBOXYHEMOGLOBIN
CARBOXYHEMOGLOBIN: 1.9 % — AB (ref 0.5–1.5)
METHEMOGLOBIN: 0.5 % (ref 0.0–1.5)
O2 SAT: 73.5 %
Total hemoglobin: 13.5 g/dL (ref 13.5–18.0)

## 2015-06-30 MED ORDER — AMLODIPINE BESYLATE 10 MG PO TABS
10.0000 mg | ORAL_TABLET | Freq: Every day | ORAL | Status: DC
  Administered 2015-06-30: 10 mg via ORAL
  Filled 2015-06-30: qty 1

## 2015-06-30 MED ORDER — ISOSORBIDE DINITRATE 40 MG PO TABS: 40.0000 mg | ORAL_TABLET | Freq: Three times a day (TID) | ORAL | Status: DC

## 2015-06-30 MED ORDER — SACUBITRIL-VALSARTAN 24-26 MG PO TABS: 1.0000 | ORAL_TABLET | Freq: Two times a day (BID) | ORAL | Status: DC

## 2015-06-30 MED ORDER — MILRINONE LACTATE IN DEXTROSE 20-5 MG/100ML-% IV SOLN: 0.1250 ug/kg/min | INTRAVENOUS | Status: DC

## 2015-06-30 MED ORDER — HYDRALAZINE HCL 100 MG PO TABS: 100.0000 mg | ORAL_TABLET | Freq: Three times a day (TID) | ORAL | Status: DC

## 2015-06-30 NOTE — Discharge Summary (Signed)
Advanced Heart Failure Team  Discharge Summary   Patient ID: Marvin Martin MRN: [id number], DOB/AGE: 50-May-1967 50 y.o. Admit date: 06/21/2015 D/C date:     06/30/2015   Primary Discharge Diagnoses:  1. Acute on chronic systolic CHF -> cardiogenic shock 2. CAD - MI x 4. Stents x 17.   Dist RCA-1 lesion, 100% stenosed. The lesion was previously treated with a stent (unknown type).  Dist RCA-2 lesion, 100% stenosed. The lesion was previously treated with a stent (unknown type).  Prox RCA lesion, 70% stenosed. The lesion was previously treated with a stent (unknown type) and angioplasty.  Mid RCA lesion, 50% stenosed. The lesion was previously treated with a stent (unknown type).  Ost RPDA to RPDA lesion, 75% stenosed. The lesion was previously treated with a drug-eluting stent.  Mid LAD lesion, 70% stenosed.  Ost 2nd Diag lesion, 70% stenosed. 3. Hypertensive crisis 4. Acute on CKD stage III- creatinine baseline ~1.8 (improved) 5. Chest pain 6. H/O R renal artery stent   Hospital Course: Marvin Martin is a 50 yo man with PMH of hypertension, ischemic cardiomyopathy, hypertension, stage III CKD, 17 previous stents, last known EF 20-25%, St. Jude ICD, dyslipidemia who follows closely with the HF clinic.   Admitted with increased dyspnea and chest pain. Placed on heparin drip and NTG drip for unstable angina. Cardiac enzymes were negative. He had repeat LHC/RHC with low output physiology and progression of CAD but no intervention was needed. Marvin Martin was left in place to guide therapy and later removed. Started on milrinone with improved cardiac output noted on CO-OX. Able to wean to 0.125 mcg. He had PICC placed for long term inotropes.  He will not be on bb due to low output. Renal function was followed closely. He was discharged on higher dose of hydralazine/isordil and lower dose of entresto due to elevated creatinine.   He had a renal ultrasound due to ongoing hypertension and h/o  right renal artery stent. This was ok with patent renal artery stent and normal left renal artery. At the time of discharge BP was better controlled with adjustments to medications.    He is being worked up for LVAD and has been evaluated by VAD coordinator, cardiac surgery, palliative care, and HF SW. At the present time LVAD is being deferred due to social issues. [facility] will follow for home milrinone. He will continue to be followed closely in the HF clinic.   R&L heart cath 06/23/15 : Stable CAD with borderline significant lesions in midLAD and mid RCA but not felt to be critical. Findings: Ao = 87/62 (71) LV = 81/9 RA = 2 RV = 20/0/2 PA = 24/12 (16) PCW = 4 Fick cardiac output/index = 3.1/1.8 Thermo CO/CI = 3.0/1.7 PVR = 3.9 WU Ao sat = 97% PA sat = 54%, 55%  Discharge Weight: 140 pounds.  Discharge Vitals: Blood pressure 145/104, pulse 105, temperature 98.1 F (36.7 C), temperature source Oral, resp. rate 16, height 5\' 7"  (1.702 m), weight 140 lb 3.4 oz (63.6 kg), SpO2 98 %.  Labs: Lab Results  Component Value Date   WBC 5.5 06/30/2015   HGB 12.7* 06/30/2015   HCT 39.1 06/30/2015   MCV 101.0* 06/30/2015   PLT 214 06/30/2015    Recent Labs Lab 06/29/15 0350 06/30/15 0340  NA 137  --   K 4.1  --   CL 104  --   CO2 26  --   BUN 33*  --   CREATININE 1.76* 1.91*  CALCIUM 9.5  --   GLUCOSE 121*  --    Lab Results  Component Value Date   CHOL 126 06/22/2015   HDL 55 06/22/2015   LDLCALC 29 06/22/2015   TRIG 208* 06/22/2015   BNP (last 3 results)  Recent Labs  05/24/15 1159 06/21/15 2323  BNP >4500.0* >4500.0*    ProBNP (last 3 results) No results for input(s): PROBNP in the last 8760 hours.   Diagnostic Studies/Procedures   No results found.  Discharge Medications     Medication List    STOP taking these medications        carvedilol 25 MG tablet  Commonly known as:  COREG     furosemide 40 MG tablet  Commonly known as:  LASIX      sacubitril-valsartan 97-103 MG  Commonly known as:  ENTRESTO  Replaced by:  sacubitril-valsartan 24-26 MG     spironolactone 25 MG tablet  Commonly known as:  ALDACTONE      TAKE these medications        Alirocumab 75 MG/ML Sopn  Commonly known as:  PRALUENT  Inject 1 pen into the skin every 14 (fourteen) days.     allopurinol 100 MG tablet  Commonly known as:  ZYLOPRIM  Take 100 mg by mouth 2 (two) times daily.     amLODipine 10 MG tablet  Commonly known as:  NORVASC  Take 10 mg by mouth daily.     aspirin 81 MG tablet  Take 81 mg by mouth daily.     calcitRIOL 0.25 MCG capsule  Commonly known as:  ROCALTROL  Take 0.25 mcg by mouth every other day. Take on Monday, Wednesday and Friday     cholecalciferol 1000 units tablet  Commonly known as:  VITAMIN D  Take 1,000 Units by mouth daily.     ezetimibe 10 MG tablet  Commonly known as:  ZETIA  Take 10 mg by mouth daily.     hydrALAZINE 100 MG tablet  Commonly known as:  APRESOLINE  Take 1 tablet (100 mg total) by mouth every 8 (eight) hours.     isosorbide dinitrate 40 MG tablet  Commonly known as:  ISORDIL  Take 1 tablet (40 mg total) by mouth 3 (three) times daily.     milrinone 20 MG/100 ML Soln infusion  Commonly known as:  PRIMACOR  Inject 8 mcg/min into the vein continuous.     nitroGLYCERIN 0.4 MG SL tablet  Commonly known as:  NITROSTAT  Place 1 tablet (0.4 mg total) under the tongue every 5 (five) minutes as needed for chest pain. Reported on 04/12/2015     omega-3 acid ethyl esters 1 g capsule  Commonly known as:  LOVAZA  Take 1 g by mouth daily.     ondansetron 4 MG tablet  Commonly known as:  ZOFRAN  Take 4 mg by mouth every 8 (eight) hours as needed for nausea or vomiting. Reported on 05/24/2015     rosuvastatin 40 MG tablet  Commonly known as:  CRESTOR  Take 40 mg by mouth daily.     sacubitril-valsartan 24-26 MG  Commonly known as:  ENTRESTO  Take 1 tablet by mouth 2 (two) times daily.      ticagrelor 90 MG Tabs tablet  Commonly known as:  BRILINTA  Take 1 tablet (90 mg total) by mouth 2 (two) times daily.        Disposition   The patient will be discharged in stable condition to home.  Discharge Instructions    Diet - low sodium heart healthy    Complete by:  As directed      Heart Failure patients record your daily weight using the same scale at the same time of day    Complete by:  As directed      Increase activity slowly    Complete by:  As directed           Follow-up Information    Follow up with [facility] On 07/01/2015.   Why:  Transitional Care Clinic appointment on 07/01/15 at 9:15 am with Dr. Jarold Song.   Contact information:   [address] [place] [phone]      Follow up with Glori Bickers, MD On 07/08/2015.   Specialty:  Cardiology   Why:  at 11:20 [place] information:   [place] [id number] [phone]         Duration of Discharge Encounter: Greater than 35 minutes   Signed, Darrick Grinder  NP-C  06/30/2015, 8:09 AM   Patient seen and examined with Darrick Grinder, NP. We discussed all aspects of the encounter. I agree with the assessment and plan as stated above.   Cardiogenic shock improved on milrinone. VAD work-up started but on hold due to social situation. Can d/c home today on milrinone. Follow-up in clinic.   Paolina Karwowski,MD 10:54 PM

## 2015-06-30 NOTE — Clinical Social Work Note (Signed)
CSW received consult for patient needing transportation home due to patient going home with a milrinone drip.  Patient will need cab voucher, CSW gave voucher to patient's bedside nurse to call once milrinone drip has been set up and patient is ready for discharge.  CSW to sign off please reconsult if other social work needs arise.  Jones Broom. Murphy, MSW, [place] 06/30/2015 4:13 PM

## 2015-06-30 NOTE — Progress Notes (Signed)
Pt discharged to home via private vehicle.  Pt A/O and verbalized understanding of discharge instructions and follow up care.. All belongings sent home with pt.  Left arm PICC in place upon discharge and discharge teaching given to pt re: medications and PICC care.  Education provided re: follow up care, when to call MD, diet and weighing daily.  Danton Clap, RN

## 2015-06-30 NOTE — Progress Notes (Signed)
Daily Progress Note   Patient Name: Marvin Martin       Date: 06/30/2015 DOB: 10-09-1965  Age: 50 y.o. MRN#: [id number] Attending Physician: Jolaine Artist, MD Primary Care Physician: Arnoldo Morale, MD Admit Date: 06/21/2015  Reason for Consultation/Follow-up: Establishing goals of care  Subjective: I followed up today with [name] who continues to be in good spirits and speaks of looking forward to going home and taking a shower. He says he is not looking forward to "dealing with all my bills when I get home" but is not concerned with payments. He talks of having calls from neighbors in his apartment building that have been checking on him, praying for him, and looking out for his apartment. He understands that he will be going home with milrinone and that discussion about receiving VAD will continue. He was happy to have HCPOA completed. He asks about transport home because his vehicle is at his home - but he does drive and have a vehicle to get to his appointments. All questions/concerns addressed.   Length of Stay: 8  Current Medications: Scheduled Meds:  . amLODipine  10 mg Oral Daily  . aspirin EC  81 mg Oral Daily  . calcitRIOL  0.25 mcg Oral QODAY  . cholecalciferol  1,000 Units Oral Daily  . enoxaparin (LOVENOX) injection  40 mg Subcutaneous Q24H  . ezetimibe  10 mg Oral Daily  . feeding supplement (ENSURE ENLIVE)  237 mL Oral TID BM  . hydrALAZINE  100 mg Oral Q8H  . isosorbide dinitrate  40 mg Oral TID  . omega-3 acid ethyl esters  1 g Oral Daily  . rosuvastatin  40 mg Oral Daily  . sacubitril-valsartan  1 tablet Oral BID  . sodium chloride flush  10-40 mL Intracatheter Q12H  . sodium chloride flush  3 mL Intravenous Q12H  . ticagrelor  90 mg Oral BID     Continuous Infusions: . milrinone 0.125 mcg/kg/min (06/30/15 0851)    PRN Meds: sodium chloride, acetaminophen, nitroGLYCERIN, ondansetron (ZOFRAN) IV, ondansetron, sodium chloride flush, sodium chloride flush  Physical Exam  Constitutional: He is oriented to person, place, and time. He appears well-developed.  HENT:  Head: Normocephalic and atraumatic.  Cardiovascular: Normal rate.   Pulmonary/Chest: Effort normal. No accessory muscle usage. No tachypnea. No respiratory distress.  Abdominal:  Normal appearance.  Neurological: He is alert and oriented to person, place, and time.            Vital Signs: BP 119/80 mmHg  Pulse 122  Temp(Src) 98.2 F (36.8 C) (Oral)  Resp 14  Ht 5\' 7"  (1.702 m)  Wt 63.6 kg (140 lb 3.4 oz)  BMI 21.96 kg/m2  SpO2 96% SpO2: SpO2: 96 % O2 Device: O2 Device: Not Delivered O2 Flow Rate:    Intake/output summary:  Intake/Output Summary (Last 24 hours) at 06/30/15 1421 Last data filed at 06/30/15 1300  Gross per 24 hour  Intake 1272.8 ml  Output    450 ml  Net  822.8 ml   LBM: Last BM Date: 06/30/15 Baseline Weight: Weight: 63.73 kg (140 lb 8 oz) Most recent weight: Weight: 63.6 kg (140 lb 3.4 oz)       Palliative Assessment/Data: PPS: 70%     Patient Active Problem List   Diagnosis Date Noted  . Palliative care encounter   . Cardiogenic shock ([place])   . Unstable angina ([place]) 06/22/2015  . Acute on chronic systolic CHF (congestive heart failure) ([place]) 06/22/2015  . Acute on chronic systolic congestive heart failure ([place])   . Recurrent angina status post coronary stent placement ([place])   . AKI (acute kidney injury) ([place])   . CHF exacerbation ([place]) 05/24/2015  . CAD (coronary artery disease) 04/03/2015  . Hypertensive heart disease 04/03/2015  . Hyperlipidemia 04/03/2015  . Acute ST elevation myocardial infarction (STEMI) ([place]) 03/31/2015  . ST elevation (STEMI) myocardial infarction involving right coronary artery ([place]) 03/31/2015   . Ischemic chest pain ([place])   . Cardiomyopathy, ischemic   . Chest pressure 02/09/2015  . CKD (chronic kidney disease) stage 3, GFR 30-59 ml/min 02/09/2015  . Chronic systolic heart failure ([place]) 01/07/2015  . CAD in native artery 01/07/2015  . Benign essential HTN 01/07/2015  . CHF (congestive heart failure) ([place]) 12/11/2014    Palliative Care Assessment & Plan   Patient Profile: 50 y.o. male with PMH of hypertension, ischemic cardiomyopathy, hypertension, stage III CKD, 17 previous stents, last known EF 20-25%, St. Jude ICD, dyslipidemia and presented to ED with sudden onset of sharp, central chest discomfort that radiates to his left arm as well as associated dyspnea, nausea and diaphoresis with pain as severe as 8/10 that improved to 5/10 and gradually eased off with heparin gtt and NTG gtt here in the hospital.    Recommendations/Plan:  Fatigue: Work up for VAD in process.   Goals of Care and Additional Recommendations:  Limitations on Scope of Treatment: Full Scope Treatment  Code Status:    Code Status Orders        Start     Ordered   06/22/15 1539  Full code   Continuous     06/22/15 1538    Code Status History    Date Active Date Inactive Code Status Order ID Comments User Context   05/24/2015  4:19 PM 05/28/2015  8:53 PM Full Code [id number]  Carlyle Dolly, MD Inpatient   03/31/2015 10:09 PM 04/03/2015  4:41 PM Full Code [id number]  Rogelia Mire, NP Inpatient       Prognosis:   Unable to determine  Discharge Planning:  Home with Home Health    Thank you for allowing the Palliative Medicine Team to assist in the care of this patient.   Time In: 1430 Time Out: 1455 Total Time 53min Prolonged Time Billed  no  Greater than 50%  of this time was spent counseling and coordinating care related to the above assessment and plan.  Pershing Proud, NP  Please contact Palliative Medicine Team phone at [phone] for questions and concerns.

## 2015-06-30 NOTE — Progress Notes (Signed)
Advanced Heart Failure Rounding Note  PCP: Dr Laney Pastor Primary Cardiologist: Dr Haroldine Laws  Subjective:    Admitted 06/22/15 with worsening SOB accompanied by nausea and diaphoresis. SBP up to 190. Started on nitro drip.    Post cath started on milrinone for low output.  Creatinine peaked 2.3.  Yesterday milrinone was cut back 0.125 mcg. Creatinine up from 1.59>1.76> 1.9. Today Co-OX 73.5%.  Denies SOB/CP   R&L heart cath 06/23/15 : Stable CAD with borderline significant lesions in midLAD and mid RCA but not felt to be critical.  Findings: Ao = 87/62 (71) LV = 81/9 RA = 2 RV = 20/0/2 PA = 24/12 (16) PCW = 4 Fick cardiac output/index = 3.1/1.8 Thermo CO/CI = 3.0/1.7 PVR = 3.9 WU Ao sat = 97% PA sat = 54%, 55%   Objective:   Weight Range: 140 lb 3.4 oz (63.6 kg) Body mass index is 21.96 kg/(m^2).   Vital Signs:   Temp:  [98 F (36.7 C)-98.4 F (36.9 C)] 98.1 F (36.7 C) (06/14 0356) Pulse Rate:  [101-122] 105 (06/14 0356) Resp:  [15-17] 16 (06/13 2000) BP: (127-145)/(81-104) 145/104 mmHg (06/14 0356) SpO2:  [95 %-98 %] 98 % (06/14 0356) Weight:  [140 lb 3.4 oz (63.6 kg)] 140 lb 3.4 oz (63.6 kg) (06/14 0356) Last BM Date: 06/25/15  Weight change: Filed Weights   06/28/15 0545 06/29/15 0500 06/30/15 0356  Weight: 140 lb 14.4 oz (63.912 kg) 141 lb 1.5 oz (64 kg) 140 lb 3.4 oz (63.6 kg)    Intake/Output:   Intake/Output Summary (Last 24 hours) at 06/30/15 0757 Last data filed at 06/30/15 0400  Gross per 24 hour  Intake 752.68 ml  Output    450 ml  Net 302.68 ml     Physical Exam: CVP 2  General: Well appearing. In bed HEENT: normal Neck: supple. Carotids 2+ bilat; no bruits. No thyromegaly or nodule noted. JVP flat.  Cor: PMI laterally displaced. RRR. No M/G/R apprecaited Lungs: CTAB, normal effort Abdomen: soft, non-tender, non-distended, no HSM. No bruits or masses. +BS  Extremities: no cyanosis, clubbing, rash, edema Neuro: alert & oriented x  3, cranial nerves grossly intact. moves all 4 extremities w/o difficulty. Affect pleasant. RUE PICC  Telemetry: Reviewed, Sinus 90-100s with PVCs.   Labs: CBC  Recent Labs  06/29/15 0350 06/30/15 0340  WBC 5.4 5.5  HGB 11.9* 12.7*  HCT 36.4* 39.1  MCV 100.3* 101.0*  PLT 179 [id number]   Basic Metabolic Panel  Recent Labs  06/28/15 0449 06/29/15 0350 06/30/15 0340  NA 136 137  --   K 3.6 4.1  --   CL 104 104  --   CO2 24 26  --   GLUCOSE 123* 121*  --   BUN 30* 33*  --   CREATININE 1.59* 1.76* 1.91*  CALCIUM 9.3 9.5  --    Liver Function Tests No results for input(s): AST, ALT, ALKPHOS, BILITOT, PROT, ALBUMIN in the last 72 hours. No results for input(s): LIPASE, AMYLASE in the last 72 hours. Cardiac Enzymes No results for input(s): CKTOTAL, CKMB, CKMBINDEX, TROPONINI in the last 72 hours.  BNP: BNP (last 3 results)  Recent Labs  05/24/15 1159 06/21/15 2323  BNP >4500.0* >4500.0*    ProBNP (last 3 results) No results for input(s): PROBNP in the last 8760 hours.   D-Dimer No results for input(s): DDIMER in the last 72 hours. Hemoglobin A1C No results for input(s): HGBA1C in the last 72 hours. Fasting Lipid Panel  No results for input(s): CHOL, HDL, LDLCALC, TRIG, CHOLHDL, LDLDIRECT in the last 72 hours. Thyroid Function Tests No results for input(s): TSH, T4TOTAL, T3FREE, THYROIDAB in the last 72 hours.  Invalid input(s): FREET3  Other results:     Imaging/Studies:  No results found.  Latest Echo  Latest Cath   Medications:     Scheduled Medications: . amLODipine  5 mg Oral Daily  . aspirin EC  81 mg Oral Daily  . calcitRIOL  0.25 mcg Oral QODAY  . cholecalciferol  1,000 Units Oral Daily  . enoxaparin (LOVENOX) injection  40 mg Subcutaneous Q24H  . ezetimibe  10 mg Oral Daily  . feeding supplement (ENSURE ENLIVE)  237 mL Oral TID BM  . hydrALAZINE  100 mg Oral Q8H  . isosorbide dinitrate  40 mg Oral TID  . omega-3 acid ethyl esters  1 g  Oral Daily  . rosuvastatin  40 mg Oral Daily  . sacubitril-valsartan  1 tablet Oral BID  . sodium chloride flush  10-40 mL Intracatheter Q12H  . sodium chloride flush  3 mL Intravenous Q12H  . ticagrelor  90 mg Oral BID    Infusions: . milrinone 0.125 mcg/kg/min (06/29/15 2000)    PRN Medications:     Assessment   1. Acute on chronic systolic CHF -> cardiogenic shock 2. CAD - MI x 4. Stents x 17.   Dist RCA-1 lesion, 100% stenosed. The lesion was previously treated with a stent (unknown type).  Dist RCA-2 lesion, 100% stenosed. The lesion was previously treated with a stent (unknown type).  Prox RCA lesion, 70% stenosed. The lesion was previously treated with a stent (unknown type) and angioplasty.  Mid RCA lesion, 50% stenosed. The lesion was previously treated with a stent (unknown type).  Ost RPDA to RPDA lesion, 75% stenosed. The lesion was previously treated with a drug-eluting stent.  Mid LAD lesion, 70% stenosed.  Ost 2nd Diag lesion, 70% stenosed. 3. Hypertensive crisis 4. Acute on CKD stage III- creatinine baseline ~1.8 (improved) 5. Chest pain   Plan   Todays CO-OX 73%. Continue milrinone to 0.125 mcg. With addition of entresto creatinine up from 1.5>1.7>1.9. [name] Continue entresto 24-26 mg twice a day. Watch closely. BMET next week. Continue hydralazine 100 mg tid/isordil 40 mg tid.     Remains hypertensive but a little better.  CT of abd - no evidence of hydronephrosis. Has R renal artery stent.Renal US ok. Stent patent. Increase amlodipine 10 daily.   CAD severe but not critical at this point. HF major issue. Continue statin, Brilinta, ECASA.   VAD not currently an option If decision to proceed with VAD is made will need to bridge Brillinta with aggrastat.    Not currently a candidate for LVAD due to social barriers.    Home today. [place] set up for home milrinone. Has ST Jude ICD.    Length of Stay: [place] NP-C  06/30/2015, 7:57 AM  Advanced  Heart Failure Team Pager [phone] (M-F; 7a - 4p)  Please contact [facility] for night-coverage after hours (4p -7a ) and weekends on [email or web address]  Patient seen and examined with Darrick Grinder, NP. We discussed all aspects of the encounter. I agree with the assessment and plan as stated above.   Improved on milrinone. Can go home today on current meds. Discussed VAD process again. Will follow closely in HF Clinic.   Bensimhon, Daniel,MD 9:23 AM

## 2015-06-30 NOTE — Care Management Note (Signed)
Case Management Note  Patient Details  Name: Marvin Martin MRN: [id number] Date of Birth: 01/27/1965  Subjective/Objective:  60 ay.o. M admitted with Acute on Chronic CHF/ Cardiogenic Shock. 6/7/Cardiac Cath R/L. Currently on Milrinone gttt @  0.177mcg which will be continued at home and followed by [facility] Carolynn Sayers has been coordinating this with Blain Pais, RNCM since early in hospitalization.). Appears pt is ready for discharge today and [facility] is prepared as well. CM will be available should additional; discharge needs arise                Action/Plan: Discharge with [facility] following for Home Infusion Therapy.    Expected Discharge Date:                  Expected Discharge Plan:  [place]  In-House Referral:     Discharge planning Services  CM Consult  Post Acute Care Choice:  Home Health, Durable Medical Equipment Choice offered to:  Patient  DME Arranged:  IV pump/equipment DME Agency:  [place]:  RN, Disease Management [place] Agency:  [place]  Status of Service:  Completed, signed off  Medicare Important Message Given:  Yes Date Medicare IM Given:    Medicare IM give by:    Date Additional Medicare IM Given:    Additional Medicare Important Message give by:     If discussed at [place] of Stay Meetings, dates discussed:    Additional Comments:  Delrae Sawyers, RN 06/30/2015, 9:50 AM

## 2015-06-30 NOTE — Hospital Discharge Follow-Up (Signed)
Met with the patient to re-schedule his appointment with the [facility]. Discharge is planned for today and the appointment has been scheduled for 07/06/15 @ 1000 with Dr Jarold Song. The visit information has been placed on the AVS. The [facility] will provide transportation to the clinic if needed. The patient noted that he needs to re-schedule his appointment for an assessment for SCAT transportation and he said that he will not have any difficulty obtaining his medications.   The patient reported no other questions/concerns at this time.   Discharge plan discussed with Carolynn Sayers, RN - [facility] Infusion Liaison. She noted the the patient will be receiving RN/infusion  and SW services.

## 2015-07-01 ENCOUNTER — Inpatient Hospital Stay: Payer: Medicare Other | Admitting: Family Medicine

## 2015-07-01 ENCOUNTER — Telehealth (HOSPITAL_COMMUNITY): Payer: Self-pay | Admitting: Pharmacist

## 2015-07-01 ENCOUNTER — Telehealth: Payer: Self-pay

## 2015-07-01 DIAGNOSIS — Z7901 Long term (current) use of anticoagulants: Secondary | ICD-10-CM | POA: Diagnosis not present

## 2015-07-01 DIAGNOSIS — I25110 Atherosclerotic heart disease of native coronary artery with unstable angina pectoris: Secondary | ICD-10-CM | POA: Diagnosis not present

## 2015-07-01 DIAGNOSIS — E785 Hyperlipidemia, unspecified: Secondary | ICD-10-CM | POA: Diagnosis not present

## 2015-07-01 DIAGNOSIS — Z955 Presence of coronary angioplasty implant and graft: Secondary | ICD-10-CM | POA: Diagnosis not present

## 2015-07-01 DIAGNOSIS — I252 Old myocardial infarction: Secondary | ICD-10-CM | POA: Diagnosis not present

## 2015-07-01 DIAGNOSIS — I130 Hypertensive heart and chronic kidney disease with heart failure and stage 1 through stage 4 chronic kidney disease, or unspecified chronic kidney disease: Secondary | ICD-10-CM | POA: Diagnosis not present

## 2015-07-01 DIAGNOSIS — Z5181 Encounter for therapeutic drug level monitoring: Secondary | ICD-10-CM | POA: Diagnosis not present

## 2015-07-01 DIAGNOSIS — N183 Chronic kidney disease, stage 3 (moderate): Secondary | ICD-10-CM | POA: Diagnosis not present

## 2015-07-01 DIAGNOSIS — I160 Hypertensive urgency: Secondary | ICD-10-CM | POA: Diagnosis not present

## 2015-07-01 DIAGNOSIS — I5023 Acute on chronic systolic (congestive) heart failure: Secondary | ICD-10-CM | POA: Diagnosis not present

## 2015-07-01 DIAGNOSIS — Z452 Encounter for adjustment and management of vascular access device: Secondary | ICD-10-CM | POA: Diagnosis not present

## 2015-07-01 DIAGNOSIS — Z950 Presence of cardiac pacemaker: Secondary | ICD-10-CM | POA: Diagnosis not present

## 2015-07-01 DIAGNOSIS — I255 Ischemic cardiomyopathy: Secondary | ICD-10-CM | POA: Diagnosis not present

## 2015-07-01 MED ORDER — ISOSORBIDE MONONITRATE ER 120 MG PO TB24: 120.0000 mg | ORAL_TABLET | Freq: Every day | ORAL | Status: DC

## 2015-07-01 NOTE — Telephone Encounter (Signed)
Transitional Care Clinic Post-discharge Follow-Up Phone Call:  Date of Discharge: 06/30/15 Principal Discharge Diagnosis(es): Acute on chronic systolic CHF-cardiogenic shock, CAD Call Completed: Yes                    With Whom: Patient     Please check all that apply:  X  Patient is knowledgeable of his/her condition(s) and/or treatment. X  Patient is caring for self at home.  ? Patient is receiving assist at home from family and/or caregiver. Family and/or caregiver is knowledgeable of patient's condition(s) and/or treatment. X Patient is receiving home health services [facility] RN/SW) with [facility].      Medication Reconciliation:  X  Medication list reviewed with patient. X  Patient obtained all discharge medications-No. Patient's medication list reviewed thoroughly. Patient indicated he had not picked up his discharge medications, but he had gotten a call from [facility] that they were ready for pick-up. Patient indicated he planned to pick up his medications today. Instructed patient to pick up his discharge medications as soon as possible today. Patient verbalized understanding. He indicated he had all other medications. Instructed patient that per discharge summary he is to STOP taking the following medications: carvedilol 25 mg tablet, furosemide 40 mg tablet, Entresto 97-103 mg, spironolactone 25 mg tablet. Patient verbalized understanding.   Activities of Daily Living:  X  Independent ? Needs assist  ? [facility]    Community resources in place for patient:  ? None  X  Home Health/Home DME-HH RN/SW services with [facility]. Patient discharged home on IV milrinone. ? Assisted Living ? Support Group        Questions/Concerns discussed: This Case Manager inquired about patient's status. Patient indicated that "so far [he] feels fine." Patient denied chest pain or shortness of breath. He indicated [place] RN with [place] came to his home today to check on IV  milrinone infusion. Patient indicated [facility] RN would do another visit on Saturday 07/03/15. Reminded patient of Transitional Care Clinic appointment on 07/06/15 at 1000 with Dr. Jarold Song. Patient aware of appointment and will need transportation since he has not had his SCAT in-person interview yet. Will inform Clinic Scheduler. Patient reminded to weigh daily and to bring weight log to Transitional Care appointment for Dr. Jarold Song to review. Patient verbalized understanding. Also, patient reminded of appointment on 07/08/15 at 1120 at [facility]. Patient verbalized understanding. No additional needs/concerns identified.

## 2015-07-01 NOTE — Telephone Encounter (Signed)
[employer] Medicare does not cover isosorbide dinitrate but does cover mononitrate formulation. Will send in new Rx for equivalent mononitrate dose per discussion with Dr. Haroldine Laws.   Ruta Hinds. Velva Harman, PharmD, BCPS, CPP Clinical Pharmacist Pager: [phone] Phone: [phone] 07/01/2015 2:20 PM

## 2015-07-02 LAB — FACTOR 5 LEIDEN

## 2015-07-03 DIAGNOSIS — I130 Hypertensive heart and chronic kidney disease with heart failure and stage 1 through stage 4 chronic kidney disease, or unspecified chronic kidney disease: Secondary | ICD-10-CM | POA: Diagnosis not present

## 2015-07-03 DIAGNOSIS — I160 Hypertensive urgency: Secondary | ICD-10-CM | POA: Diagnosis not present

## 2015-07-03 DIAGNOSIS — I25110 Atherosclerotic heart disease of native coronary artery with unstable angina pectoris: Secondary | ICD-10-CM | POA: Diagnosis not present

## 2015-07-03 DIAGNOSIS — I255 Ischemic cardiomyopathy: Secondary | ICD-10-CM | POA: Diagnosis not present

## 2015-07-03 DIAGNOSIS — N183 Chronic kidney disease, stage 3 (moderate): Secondary | ICD-10-CM | POA: Diagnosis not present

## 2015-07-03 DIAGNOSIS — I5023 Acute on chronic systolic (congestive) heart failure: Secondary | ICD-10-CM | POA: Diagnosis not present

## 2015-07-04 ENCOUNTER — Other Ambulatory Visit (HOSPITAL_COMMUNITY): Payer: Self-pay | Admitting: Internal Medicine

## 2015-07-05 ENCOUNTER — Telehealth: Payer: Self-pay

## 2015-07-05 NOTE — Telephone Encounter (Signed)
Call placed to the patient and confirmed his appointment for tomorrow, 07/06/15 @ 1000 at [facility]. He stated that he will need transportation to the clinic. Jacklynn Lewis, [facility] scheduler, notified of the need for a cab.  The patient stated that [facility] and he has been taking them as ordered and does not have any questions about the medications. He said that the nurse from [facility]) was at his home on Saturday, 07/03/15 to check on him and she reviewed his medications and she  is planning to see him again on 07/07/15. Instructed the patient to bring all of his medications to his appointment tomorrow and he stated that he would.  He noted that he has an appointment for his SCAT assessment on 07/07/15 @ 1030.  No problems/questions reported at this time.

## 2015-07-06 ENCOUNTER — Ambulatory Visit: Payer: Medicare Other | Attending: Family Medicine | Admitting: Family Medicine

## 2015-07-06 ENCOUNTER — Encounter: Payer: Self-pay | Admitting: Family Medicine

## 2015-07-06 ENCOUNTER — Telehealth: Payer: Self-pay

## 2015-07-06 VITALS — BP 142/89 | HR 96 | Temp 98.3°F | Resp 20 | Ht 67.0 in | Wt 147.8 lb

## 2015-07-06 DIAGNOSIS — Z950 Presence of cardiac pacemaker: Secondary | ICD-10-CM | POA: Diagnosis not present

## 2015-07-06 DIAGNOSIS — I209 Angina pectoris, unspecified: Secondary | ICD-10-CM

## 2015-07-06 DIAGNOSIS — E785 Hyperlipidemia, unspecified: Secondary | ICD-10-CM | POA: Insufficient documentation

## 2015-07-06 DIAGNOSIS — Z955 Presence of coronary angioplasty implant and graft: Secondary | ICD-10-CM

## 2015-07-06 DIAGNOSIS — Z951 Presence of aortocoronary bypass graft: Secondary | ICD-10-CM

## 2015-07-06 DIAGNOSIS — I130 Hypertensive heart and chronic kidney disease with heart failure and stage 1 through stage 4 chronic kidney disease, or unspecified chronic kidney disease: Secondary | ICD-10-CM | POA: Diagnosis not present

## 2015-07-06 DIAGNOSIS — Z7982 Long term (current) use of aspirin: Secondary | ICD-10-CM | POA: Diagnosis not present

## 2015-07-06 DIAGNOSIS — Z79899 Other long term (current) drug therapy: Secondary | ICD-10-CM | POA: Diagnosis not present

## 2015-07-06 DIAGNOSIS — N183 Chronic kidney disease, stage 3 unspecified: Secondary | ICD-10-CM

## 2015-07-06 DIAGNOSIS — I5042 Chronic combined systolic (congestive) and diastolic (congestive) heart failure: Secondary | ICD-10-CM | POA: Diagnosis not present

## 2015-07-06 DIAGNOSIS — I10 Essential (primary) hypertension: Secondary | ICD-10-CM

## 2015-07-06 NOTE — Telephone Encounter (Signed)
Transitional Care Clinic Post-discharge Follow-Up Phone Call:  Date of Discharge: 07/05/2015 Principal Discharge Diagnosis(es):  Unstable angina, CAD, HTN Post-discharge Communication: (Clearly document all attempts clearly and date contact made) Call placed to the patient Call Completed: Yes                    With Whom: Patient Interpreter Needed: No     Please check all that apply:  X  Patient is knowledgeable of his/her condition(s) and/or treatment. X  Patient is caring for self at home. He said that his son is with him and can provide the needed assistance.  ? Patient is receiving assist at home from family and/or caregiver. Family and/or caregiver is knowledgeable of patient's condition(s) and/or treatment. ? Patient is receiving home health services. If so, name of agency.     Medication Reconciliation:  ? Medication list reviewed with patient. X  Patient obtained all discharge medications. If not, why? He has not obtained any medication and does not have any medication from prior to his hospitalization. He stated that the reason why he ended up in the    Activities of Daily Living:  X  Independent - has a cane to use if needed ? Needs assist (describe; ? home DME used) ? Total Care (describe, ? home DME used)   Community resources in place for patient:  X  None  ? Home Health/Home DME ? Assisted Living ? Support Group          Patient Education:        Questions/Concerns discussed:

## 2015-07-06 NOTE — Patient Instructions (Signed)
Hypertension Hypertension, commonly called high blood pressure, is when the force of blood pumping through your arteries is too strong. Your arteries are the blood vessels that carry blood from your heart throughout your body. A blood pressure reading consists of a higher number over a lower number, such as 110/72. The higher number (systolic) is the pressure inside your arteries when your heart pumps. The lower number (diastolic) is the pressure inside your arteries when your heart relaxes. Ideally you want your blood pressure below 120/80. Hypertension forces your heart to work harder to pump blood. Your arteries may become narrow or stiff. Having untreated or uncontrolled hypertension can cause heart attack, stroke, kidney disease, and other problems. RISK FACTORS Some risk factors for high blood pressure are controllable. Others are not.  Risk factors you cannot control include:   Race. You may be at higher risk if you are African American.  Age. Risk increases with age.  Gender. Men are at higher risk than women before age 45 years. After age 65, women are at higher risk than men. Risk factors you can control include:  Not getting enough exercise or physical activity.  Being overweight.  Getting too much fat, sugar, calories, or salt in your diet.  Drinking too much alcohol. SIGNS AND SYMPTOMS Hypertension does not usually cause signs or symptoms. Extremely high blood pressure (hypertensive crisis) may cause headache, anxiety, shortness of breath, and nosebleed. DIAGNOSIS To check if you have hypertension, your health care provider will measure your blood pressure while you are seated, with your arm held at the level of your heart. It should be measured at least twice using the same arm. Certain conditions can cause a difference in blood pressure between your right and left arms. A blood pressure reading that is higher than normal on one occasion does not mean that you need treatment. If  it is not clear whether you have high blood pressure, you may be asked to return on a different day to have your blood pressure checked again. Or, you may be asked to monitor your blood pressure at home for 1 or more weeks. TREATMENT Treating high blood pressure includes making lifestyle changes and possibly taking medicine. Living a healthy lifestyle can help lower high blood pressure. You may need to change some of your habits. Lifestyle changes may include:  Following the DASH diet. This diet is high in fruits, vegetables, and whole grains. It is low in salt, red meat, and added sugars.  Keep your sodium intake below 2,300 mg per day.  Getting at least 30-45 minutes of aerobic exercise at least 4 times per week.  Losing weight if necessary.  Not smoking.  Limiting alcoholic beverages.  Learning ways to reduce stress. Your health care provider may prescribe medicine if lifestyle changes are not enough to get your blood pressure under control, and if one of the following is true:  You are 18-59 years of age and your systolic blood pressure is above 140.  You are 60 years of age or older, and your systolic blood pressure is above 150.  Your diastolic blood pressure is above 90.  You have diabetes, and your systolic blood pressure is over 140 or your diastolic blood pressure is over 90.  You have kidney disease and your blood pressure is above 140/90.  You have heart disease and your blood pressure is above 140/90. Your personal target blood pressure may vary depending on your medical conditions, your age, and other factors. HOME CARE INSTRUCTIONS    Have your blood pressure rechecked as directed by your health care provider.   Take medicines only as directed by your health care provider. Follow the directions carefully. Blood pressure medicines must be taken as prescribed. The medicine does not work as well when you skip doses. Skipping doses also puts you at risk for  problems.  Do not smoke.   Monitor your blood pressure at home as directed by your health care provider. SEEK MEDICAL CARE IF:   You think you are having a reaction to medicines taken.  You have recurrent headaches or feel dizzy.  You have swelling in your ankles.  You have trouble with your vision. SEEK IMMEDIATE MEDICAL CARE IF:  You develop a severe headache or confusion.  You have unusual weakness, numbness, or feel faint.  You have severe chest or abdominal pain.  You vomit repeatedly.  You have trouble breathing. MAKE SURE YOU:   Understand these instructions.  Will watch your condition.  Will get help right away if you are not doing well or get worse.   This information is not intended to replace advice given to you by your health care provider. Make sure you discuss any questions you have with your health care provider.   Document Released: 01/02/2005 Document Revised: 05/19/2014 Document Reviewed: 10/25/2012 Elsevier Interactive Patient Education 2016 Elsevier Inc.  

## 2015-07-06 NOTE — Progress Notes (Signed)
Hospital follow up?

## 2015-07-06 NOTE — Telephone Encounter (Signed)
Please disregard the prior TCC discharge call as it does not pertain to this patient.

## 2015-07-06 NOTE — Progress Notes (Signed)
[place]  Date of telephone encounter: 07/01/15  Hospitalization dates: 06/21/15 through 06/30/15  Subjective:  Patient ID: Marvin Martin, male    DOB: 1965-11-24  Age: 50 y.o. MRN: [id number]  CC: Hospitalization Follow-up   HPI Habram Caler is a 50 year old with medical history significant for CAD (s/p several MI and stents 17 with recurrent in-stent thrombosis), ICM, CHF (EF 20-25%) , HLD, and stage III chronic kidney disease, history of right renal artery stent.  He presented to the ED with chest pain and shortness of breath; troponins were negative. Cardiac cath revealed severe three-vessel coronary artery disease, severe left ventricular dysfunction and markedly reduced cardiac output; no intervention needed as per cardiology. He was commenced on milrinone infusion with plans for LVAD which were put on hold due to no family and poor social support. Carvedilol and Lasix were discontinued, hydralazine and isosorbide for increased and the dose of his Entresto reduced due to elevated creatinine. He was discharged with advanced Homecare services and close follow-up with cardiology.   Interval history Today he reports feeling well and denies chest pains or shortness of breath. I have reviewed his medications myself and found he is yet to pick up his increased dose of isosorbide, hydralazine and still has his carvedilol and Lasix medications. He is coping well with his milrinone infusion. Has not acute concerns today.     Past Medical History  Diagnosis Date  . Chronic systolic CHF (congestive heart failure) ([place])     a. 01/2015 Echo: EF 20-25%, antlat, inflat AK.  [name] Hyperlipidemia   . Coronary artery disease     a. s/p MI x 4;  b. Reported h/o 17 stents with recurrent ISR;  c. 02/2014 Cath ([place]): PCI to unknown vessel;  d. 01/2015 MV: EF 18%, large scar, no ischemia; e. 03/2015 PCI: LM nl, LAD nl, D2 patent stent, RI patent stent, LCX patent stents p/m, RCA 95p ISR  (PTCA), 56m, 100d into RPL CTO, RPDA 90 (2.5x16 Synergy DES).  . Hypertensive heart disease   . Ischemic cardiomyopathy     a. s/p SJM ICD 2007 w/ gen change in 2012; b. 12/2014 CPX Test: mild to mod HF limitation w/ pVO2 20.3 and nl slope;  c. 01/2015 Echo: EF 20-25%.  . CKD (chronic kidney disease), stage III    Past Surgical History  Procedure Laterality Date  . [place] placements Bilateral   . Pacemaker generator change Bilateral   . Insert / replace / remove pacemaker      St jude 2006  Generator Change 2012   . Cardiac catheterization      Most recent 02/2014   . Cardiac catheterization N/A 03/31/2015    Procedure: Left Heart Cath and Coronary Angiography;  Surgeon: Troy Sine, MD;  Location: [place] [facility];  Service: Cardiovascular;  Laterality: N/A;  . Cardiac catheterization N/A 03/31/2015    Procedure: Coronary Stent Intervention;  Surgeon: Troy Sine, MD;  Location: [place] [facility];  Service: Cardiovascular;  Laterality: N/A;  . Cardiac catheterization N/A 05/27/2015    Procedure: Right/Left Heart Cath and Coronary Angiography;  Surgeon: Jolaine Artist, MD;  Location: [place] [facility];  Service: Cardiovascular;  Laterality: N/A;  . Cardiac catheterization N/A 06/23/2015    Procedure: Right/Left Heart Cath and Coronary Angiography;  Surgeon: Jolaine Artist, MD;  Location: [place] [facility];  Service: Cardiovascular;  Laterality: N/A;     Outpatient Prescriptions Prior to Visit  Medication Sig Dispense  Refill  . Alirocumab (PRALUENT) 75 MG/ML SOPN Inject 1 pen into the skin every 14 (fourteen) days. 2 pen 11  . allopurinol (ZYLOPRIM) 100 MG tablet Take 100 mg by mouth 2 (two) times daily.     [name] amLODipine (NORVASC) 10 MG tablet Take 10 mg by mouth daily.    [name] aspirin 81 MG tablet Take 81 mg by mouth daily.    . calcitRIOL (ROCALTROL) 0.25 MCG capsule Take 0.25 mcg by mouth every other day. Take on Monday, Wednesday and Friday    . cholecalciferol (VITAMIN  D) 1000 UNITS tablet Take 1,000 Units by mouth daily.    [name] ezetimibe (ZETIA) 10 MG tablet Take 10 mg by mouth daily.    . hydrALAZINE (APRESOLINE) 100 MG tablet Take 1 tablet (100 mg total) by mouth every 8 (eight) hours. 90 tablet 6  . isosorbide mononitrate (IMDUR) 120 MG 24 hr tablet Take 1 tablet (120 mg total) by mouth daily. 30 tablet 5  . milrinone (PRIMACOR) 20 MG/100 ML SOLN infusion Inject 8 mcg/min into the vein continuous. 150 mL 6  . nitroGLYCERIN (NITROSTAT) 0.4 MG SL tablet Place 1 tablet (0.4 mg total) under the tongue every 5 (five) minutes as needed for chest pain. Reported on 04/12/2015 60 tablet 1  . omega-3 acid ethyl esters (LOVAZA) 1 G capsule Take 1 g by mouth daily.     . ondansetron (ZOFRAN) 4 MG tablet Take 4 mg by mouth every 8 (eight) hours as needed for nausea or vomiting. Reported on 05/24/2015    . rosuvastatin (CRESTOR) 40 MG tablet Take 40 mg by mouth daily.    . sacubitril-valsartan (ENTRESTO) 24-26 MG Take 1 tablet by mouth 2 (two) times daily. 60 tablet 6  . ticagrelor (BRILINTA) 90 MG TABS tablet Take 1 tablet (90 mg total) by mouth 2 (two) times daily. 60 tablet 5   No facility-administered medications prior to visit.    ROS Review of Systems  Constitutional: Negative for activity change and appetite change.  HENT: Negative for sinus pressure and sore throat.   Eyes: Negative for visual disturbance.  Respiratory: Negative for cough, chest tightness and shortness of breath.   Cardiovascular: Negative for chest pain and leg swelling.  Gastrointestinal: Negative for abdominal pain, diarrhea, constipation and abdominal distention.  Endocrine: Negative.   Genitourinary: Negative for dysuria.  Musculoskeletal: Negative for myalgias and joint swelling.  Skin: Negative for rash.  Allergic/Immunologic: Negative.   Neurological: Negative for weakness, light-headedness and numbness.  Psychiatric/Behavioral: Negative for suicidal ideas and dysphoric mood.     Objective:  BP 142/89 mmHg  Pulse 96  Temp(Src) 98.3 F (36.8 C) (Oral)  Resp 20  Ht 5\' 7"  (1.702 m)  Wt 147 lb 12.8 oz (67.042 kg)  BMI 23.14 kg/m2  SpO2 98%  BP/Weight 07/06/2015 06/30/2015 [id number]  Systolic BP [id number] [id number] [id number]  Diastolic BP 89 80 123456  Wt. (Lbs) 147.8 140.21 146.2  BMI 23.14 21.96 22.89    Wt Readings from Last 3 Encounters:  07/06/15 147 lb 12.8 oz (67.042 kg)  06/30/15 140 lb 3.4 oz (63.6 kg)  06/08/15 146 lb 3.2 oz (66.316 kg)     Physical Exam  Constitutional: He is oriented to person, place, and time. He appears well-developed and well-nourished.  Cardiovascular: Normal rate, normal heart sounds and intact distal pulses.   No murmur heard. Pulmonary/Chest: Effort normal and breath sounds normal. He has no wheezes. He has no rales. He exhibits no tenderness.  Abdominal: Soft. Bowel  sounds are normal. He exhibits no distension and no mass. There is no tenderness.  Musculoskeletal: Normal range of motion.  Right arm PICC line  Neurological: He is alert and oriented to person, place, and time.  Psychiatric: He has a normal mood and affect.     Assessment & Plan:   1. Chronic combined systolic and diastolic congestive heart failure (HCC) EF 20-25%, status post ICD LVAD currently on hold due to social situation No evidence of fluid overload; weight is stable Currently on milrinone infusion; advanced home care to see the patient once/ week Scheduled to see cardiology in 2 days  2. Benign essential HTN Slight systolic elevation Advised to pickup prescription for 100 mg of hydralazine rather than the 50 mg which he is still taking. Continue antihypertensives  3. CKD (chronic kidney disease) stage 3, GFR 30-59 ml/min History of right renal artery stent. Avoid nephrotoxins - On vitamin D and calcitriol - Ambulatory referral to Nephrology  4. Recurrent angina status post coronary stent placement [facility]) Status post 17 stents Continue Brilinta,  Praluent , Crestor Continue aggressive except the modification   I reviewed all his medications and reconciled them. Advised to discontinue Lasix and Coreg as per discharge notes.  No orders of the defined types were placed in this encounter.    Follow-up: Return in about 2 weeks (around 07/20/2015) for TCC- follow-up on CHF.   Arnoldo Morale MD

## 2015-07-07 DIAGNOSIS — I25110 Atherosclerotic heart disease of native coronary artery with unstable angina pectoris: Secondary | ICD-10-CM | POA: Diagnosis not present

## 2015-07-07 DIAGNOSIS — I5023 Acute on chronic systolic (congestive) heart failure: Secondary | ICD-10-CM | POA: Diagnosis not present

## 2015-07-07 DIAGNOSIS — I255 Ischemic cardiomyopathy: Secondary | ICD-10-CM | POA: Diagnosis not present

## 2015-07-07 DIAGNOSIS — I160 Hypertensive urgency: Secondary | ICD-10-CM | POA: Diagnosis not present

## 2015-07-07 DIAGNOSIS — I110 Hypertensive heart disease with heart failure: Secondary | ICD-10-CM | POA: Diagnosis not present

## 2015-07-07 DIAGNOSIS — N183 Chronic kidney disease, stage 3 (moderate): Secondary | ICD-10-CM | POA: Diagnosis not present

## 2015-07-07 DIAGNOSIS — I130 Hypertensive heart and chronic kidney disease with heart failure and stage 1 through stage 4 chronic kidney disease, or unspecified chronic kidney disease: Secondary | ICD-10-CM | POA: Diagnosis not present

## 2015-07-08 ENCOUNTER — Encounter (HOSPITAL_COMMUNITY): Payer: Medicare Other

## 2015-07-09 DIAGNOSIS — I160 Hypertensive urgency: Secondary | ICD-10-CM | POA: Diagnosis not present

## 2015-07-09 DIAGNOSIS — I130 Hypertensive heart and chronic kidney disease with heart failure and stage 1 through stage 4 chronic kidney disease, or unspecified chronic kidney disease: Secondary | ICD-10-CM | POA: Diagnosis not present

## 2015-07-09 DIAGNOSIS — I25110 Atherosclerotic heart disease of native coronary artery with unstable angina pectoris: Secondary | ICD-10-CM | POA: Diagnosis not present

## 2015-07-09 DIAGNOSIS — I5023 Acute on chronic systolic (congestive) heart failure: Secondary | ICD-10-CM | POA: Diagnosis not present

## 2015-07-09 DIAGNOSIS — N183 Chronic kidney disease, stage 3 (moderate): Secondary | ICD-10-CM | POA: Diagnosis not present

## 2015-07-09 DIAGNOSIS — I255 Ischemic cardiomyopathy: Secondary | ICD-10-CM | POA: Diagnosis not present

## 2015-07-13 ENCOUNTER — Other Ambulatory Visit: Payer: Self-pay | Admitting: Pharmacist

## 2015-07-13 ENCOUNTER — Telehealth: Payer: Self-pay

## 2015-07-13 DIAGNOSIS — N183 Chronic kidney disease, stage 3 (moderate): Secondary | ICD-10-CM | POA: Diagnosis not present

## 2015-07-13 DIAGNOSIS — I255 Ischemic cardiomyopathy: Secondary | ICD-10-CM | POA: Diagnosis not present

## 2015-07-13 DIAGNOSIS — I25110 Atherosclerotic heart disease of native coronary artery with unstable angina pectoris: Secondary | ICD-10-CM | POA: Diagnosis not present

## 2015-07-13 DIAGNOSIS — I5023 Acute on chronic systolic (congestive) heart failure: Secondary | ICD-10-CM | POA: Diagnosis not present

## 2015-07-13 DIAGNOSIS — I130 Hypertensive heart and chronic kidney disease with heart failure and stage 1 through stage 4 chronic kidney disease, or unspecified chronic kidney disease: Secondary | ICD-10-CM | POA: Diagnosis not present

## 2015-07-13 DIAGNOSIS — I160 Hypertensive urgency: Secondary | ICD-10-CM | POA: Diagnosis not present

## 2015-07-13 MED ORDER — EZETIMIBE 10 MG PO TABS: 10.0000 mg | ORAL_TABLET | Freq: Every day | ORAL | Status: DC

## 2015-07-13 NOTE — Telephone Encounter (Signed)
Call placed to the patient to check on his status.  He stated that " everything is great."  He noted that his milrinone bag was delivered today and his home health RN will see him tomorrow to change the milrinone bag and his PICC line dressing. He denied having any chest pain or shortness of breath and had no questions/concerns at this time. He stated that [facility] and he needs a new prescription for zetia. This CM informed him that Nicoletta Ba, [facility] , would be notified.  He also reported that he had his assessment for [facility] done and he received his card and stated that he needs to call 24 hours before an appointment to schedule his ride.  His next scheduled appointment with Dr Jarold Song is 07/27/15 @ 0900.   Nicoletta Ba, [facility] notified of the need for a new prescription for zetia. She placed the order and sent [facility].

## 2015-07-14 DIAGNOSIS — N183 Chronic kidney disease, stage 3 (moderate): Secondary | ICD-10-CM | POA: Diagnosis not present

## 2015-07-14 DIAGNOSIS — I160 Hypertensive urgency: Secondary | ICD-10-CM | POA: Diagnosis not present

## 2015-07-14 DIAGNOSIS — I255 Ischemic cardiomyopathy: Secondary | ICD-10-CM | POA: Diagnosis not present

## 2015-07-14 DIAGNOSIS — I25110 Atherosclerotic heart disease of native coronary artery with unstable angina pectoris: Secondary | ICD-10-CM | POA: Diagnosis not present

## 2015-07-14 DIAGNOSIS — I5023 Acute on chronic systolic (congestive) heart failure: Secondary | ICD-10-CM | POA: Diagnosis not present

## 2015-07-14 DIAGNOSIS — I130 Hypertensive heart and chronic kidney disease with heart failure and stage 1 through stage 4 chronic kidney disease, or unspecified chronic kidney disease: Secondary | ICD-10-CM | POA: Diagnosis not present

## 2015-07-16 ENCOUNTER — Other Ambulatory Visit (HOSPITAL_COMMUNITY): Payer: Self-pay | Admitting: Internal Medicine

## 2015-07-17 ENCOUNTER — Other Ambulatory Visit (HOSPITAL_COMMUNITY): Payer: Self-pay | Admitting: Internal Medicine

## 2015-07-21 ENCOUNTER — Telehealth: Payer: Self-pay

## 2015-07-21 DIAGNOSIS — I5023 Acute on chronic systolic (congestive) heart failure: Secondary | ICD-10-CM | POA: Diagnosis not present

## 2015-07-21 DIAGNOSIS — I10 Essential (primary) hypertension: Secondary | ICD-10-CM | POA: Diagnosis not present

## 2015-07-21 DIAGNOSIS — I255 Ischemic cardiomyopathy: Secondary | ICD-10-CM | POA: Diagnosis not present

## 2015-07-21 DIAGNOSIS — I160 Hypertensive urgency: Secondary | ICD-10-CM | POA: Diagnosis not present

## 2015-07-21 DIAGNOSIS — I130 Hypertensive heart and chronic kidney disease with heart failure and stage 1 through stage 4 chronic kidney disease, or unspecified chronic kidney disease: Secondary | ICD-10-CM | POA: Diagnosis not present

## 2015-07-21 DIAGNOSIS — I25110 Atherosclerotic heart disease of native coronary artery with unstable angina pectoris: Secondary | ICD-10-CM | POA: Diagnosis not present

## 2015-07-21 DIAGNOSIS — N183 Chronic kidney disease, stage 3 (moderate): Secondary | ICD-10-CM | POA: Diagnosis not present

## 2015-07-21 NOTE — Telephone Encounter (Signed)
This Case Manager placed call to patient to check status. Call placed to [phone]; unable to reach patient. HIPPA compliant voicemail left requesting return call. In addition, call placed to [phone]; unable to reach patient. An additional HIPPA compliant voicemail left requesting return call.

## 2015-07-22 ENCOUNTER — Telehealth: Payer: Self-pay

## 2015-07-22 NOTE — Telephone Encounter (Signed)
This Case Manager placed call to patient to check on status. Call placed to [phone]; unable to reach patient or leave voicemail. Also placed call to [phone]; unable to reach patient. HIPPA compliant voicemail left requesting return call.

## 2015-07-26 ENCOUNTER — Telehealth: Payer: Self-pay

## 2015-07-26 NOTE — Telephone Encounter (Signed)
Attempted to contact the patient to check on his status and to remind him of his appointment at the [facility] tomorrow, 07/27/15 @ 0900. Calls placed to # [phone] (H) [phone] (M) and HIPAA compliant voice mail messages were left a each number requesting a call back to # [phone] or [phone].

## 2015-07-27 ENCOUNTER — Ambulatory Visit: Payer: Medicare Other | Admitting: Family Medicine

## 2015-07-28 ENCOUNTER — Telehealth: Payer: Self-pay

## 2015-07-28 DIAGNOSIS — I160 Hypertensive urgency: Secondary | ICD-10-CM | POA: Diagnosis not present

## 2015-07-28 DIAGNOSIS — I130 Hypertensive heart and chronic kidney disease with heart failure and stage 1 through stage 4 chronic kidney disease, or unspecified chronic kidney disease: Secondary | ICD-10-CM | POA: Diagnosis not present

## 2015-07-28 DIAGNOSIS — N183 Chronic kidney disease, stage 3 (moderate): Secondary | ICD-10-CM | POA: Diagnosis not present

## 2015-07-28 DIAGNOSIS — I255 Ischemic cardiomyopathy: Secondary | ICD-10-CM | POA: Diagnosis not present

## 2015-07-28 DIAGNOSIS — I5023 Acute on chronic systolic (congestive) heart failure: Secondary | ICD-10-CM | POA: Diagnosis not present

## 2015-07-28 DIAGNOSIS — I25110 Atherosclerotic heart disease of native coronary artery with unstable angina pectoris: Secondary | ICD-10-CM | POA: Diagnosis not present

## 2015-07-28 NOTE — Telephone Encounter (Signed)
Call placed to the patient to check on his status and to discuss scheduling another follow up a TCC appointment as he missed his appointment yesterday. He apologized for missing the appointment and was pleased to schedule another appointment. The appointment was scheduled for 08/03/15 @ 1100. He said that he can use [facility] for transportation to the clinic and noted that he has used [facility] to go shopping and it worked well for him. He stated that he is feeling " all right " and the nurse from [place] came this morning and changed his milrinone bag. He said that he has all of his medications and is taking them as ordered.  No questions/problems at this time reported.

## 2015-07-29 ENCOUNTER — Other Ambulatory Visit (HOSPITAL_COMMUNITY): Payer: Self-pay | Admitting: Internal Medicine

## 2015-08-02 ENCOUNTER — Telehealth: Payer: Self-pay

## 2015-08-02 NOTE — Telephone Encounter (Signed)
Attempted to contact the patient to inquire how he is feeling and to remind him of his appointment at [facility] tomorrow, 08/03/15 @1100 . Calls placed to # [phone] (H) [phone] (M) and HIPAA compliant voicemail messages were left on both numbers requesting a call back to # [phone] or [phone].

## 2015-08-03 ENCOUNTER — Ambulatory Visit: Payer: Medicare Other | Attending: Family Medicine | Admitting: Family Medicine

## 2015-08-03 ENCOUNTER — Encounter: Payer: Self-pay | Admitting: Family Medicine

## 2015-08-03 ENCOUNTER — Telehealth: Payer: Self-pay | Admitting: Licensed Clinical Social Worker

## 2015-08-03 VITALS — BP 144/107 | HR 114 | Temp 98.1°F | Resp 16 | Ht 67.0 in | Wt 145.2 lb

## 2015-08-03 DIAGNOSIS — N183 Chronic kidney disease, stage 3 unspecified: Secondary | ICD-10-CM

## 2015-08-03 DIAGNOSIS — I209 Angina pectoris, unspecified: Secondary | ICD-10-CM | POA: Diagnosis not present

## 2015-08-03 DIAGNOSIS — I5022 Chronic systolic (congestive) heart failure: Secondary | ICD-10-CM

## 2015-08-03 DIAGNOSIS — Z951 Presence of aortocoronary bypass graft: Secondary | ICD-10-CM

## 2015-08-03 DIAGNOSIS — Z955 Presence of coronary angioplasty implant and graft: Secondary | ICD-10-CM

## 2015-08-03 DIAGNOSIS — I10 Essential (primary) hypertension: Secondary | ICD-10-CM | POA: Diagnosis not present

## 2015-08-03 DIAGNOSIS — I200 Unstable angina: Secondary | ICD-10-CM

## 2015-08-03 NOTE — Progress Notes (Signed)
Subjective:    Patient ID: Marvin Martin, male    DOB: 07-25-1965, 50 y.o.   MRN: [id number]  HPI He is a 50 year old with medical history significant for CAD (s/p several MI and stents 17 with recurrent in-stent thrombosis), ICM, CHF (EF 20-25%) , HLD, and stage III chronic kidney disease, history of right renal artery stent Who is here for a follow-up visit. Most recent Cardiac cath in 06/2015 revealed severe three-vessel coronary artery disease, severe left ventricular dysfunction and markedly reduced cardiac output; no intervention needed as per cardiology. He is on milrinone infusion with plans for LVAD in the near future.  He has advanced Fish farm manager services once a week.  Interval history Today he reports feeling well and denies chest pains or shortness of breath. Exercise tolerance is one to one and a half blocks. Has no acute concerns today.    Past Medical History  Diagnosis Date  . Chronic systolic CHF (congestive heart failure) ([place])     a. 01/2015 Echo: EF 20-25%, antlat, inflat AK.  [name] Hyperlipidemia   . Coronary artery disease     a. s/p MI x 4;  b. Reported h/o 17 stents with recurrent ISR;  c. 02/2014 Cath ([place]): PCI to unknown vessel;  d. 01/2015 MV: EF 18%, large scar, no ischemia; e. 03/2015 PCI: LM nl, LAD nl, D2 patent stent, RI patent stent, LCX patent stents p/m, RCA 95p ISR (PTCA), 12m, 100d into RPL CTO, RPDA 90 (2.5x16 Synergy DES).  . Hypertensive heart disease   . Ischemic cardiomyopathy     a. s/p SJM ICD 2007 w/ gen change in 2012; b. 12/2014 CPX Test: mild to mod HF limitation w/ pVO2 20.3 and nl slope;  c. 01/2015 Echo: EF 20-25%.  . CKD (chronic kidney disease), stage III     Past Surgical History  Procedure Laterality Date  . [place] placements Bilateral   . Pacemaker generator change Bilateral   . Insert / replace / remove pacemaker      St jude 2006  Generator Change 2012   . Cardiac catheterization      Most recent 02/2014   . Cardiac  catheterization N/A 03/31/2015    Procedure: Left Heart Cath and Coronary Angiography;  Surgeon: Troy Sine, MD;  Location: [place] [facility];  Service: Cardiovascular;  Laterality: N/A;  . Cardiac catheterization N/A 03/31/2015    Procedure: Coronary Stent Intervention;  Surgeon: Troy Sine, MD;  Location: [place] [facility];  Service: Cardiovascular;  Laterality: N/A;  . Cardiac catheterization N/A 05/27/2015    Procedure: Right/Left Heart Cath and Coronary Angiography;  Surgeon: Jolaine Artist, MD;  Location: [place] [facility];  Service: Cardiovascular;  Laterality: N/A;  . Cardiac catheterization N/A 06/23/2015    Procedure: Right/Left Heart Cath and Coronary Angiography;  Surgeon: Jolaine Artist, MD;  Location: [place] [facility];  Service: Cardiovascular;  Laterality: N/A;    No Known Allergies  Past Medical History  Diagnosis Date  . Chronic systolic CHF (congestive heart failure) ([place])     a. 01/2015 Echo: EF 20-25%, antlat, inflat AK.  [name] Hyperlipidemia   . Coronary artery disease     a. s/p MI x 4;  b. Reported h/o 17 stents with recurrent ISR;  c. 02/2014 Cath ([place]): PCI to unknown vessel;  d. 01/2015 MV: EF 18%, large scar, no ischemia; e. 03/2015 PCI: LM nl, LAD nl, D2 patent stent, RI patent stent, LCX patent stents p/m, RCA  95p ISR (PTCA), 61m, 100d into RPL CTO, RPDA 90 (2.5x16 Synergy DES).  . Hypertensive heart disease   . Ischemic cardiomyopathy     a. s/p SJM ICD 2007 w/ gen change in 2012; b. 12/2014 CPX Test: mild to mod HF limitation w/ pVO2 20.3 and nl slope;  c. 01/2015 Echo: EF 20-25%.  . CKD (chronic kidney disease), stage III     Review of Systems Constitutional: Negative for activity change and appetite change.  HENT: Negative for sinus pressure and sore throat.   Eyes: Negative for visual disturbance.  Respiratory: Negative for cough, chest tightness and shortness of breath.   Cardiovascular: Negative for chest pain and leg swelling.    Gastrointestinal: Negative for abdominal pain, diarrhea, constipation and abdominal distention.  Endocrine: Negative.   Genitourinary: Negative for dysuria.  Musculoskeletal: Negative for myalgias and joint swelling.  Skin: Negative for rash.  Allergic/Immunologic: Negative.   Neurological: Negative for weakness, light-headedness and numbness.  Psychiatric/Behavioral: Negative for suicidal ideas and dysphoric mood.     Objective: Filed Vitals:   08/03/15 1052  BP: 144/107  Pulse: 114  Temp: 98.1 F (36.7 C)  TempSrc: Oral  Resp: 16  Height: 5\' 7"  (1.702 m)  Weight: 145 lb 3.2 oz (65.862 kg)  SpO2: 98%      Physical Exam  Constitutional: He is oriented to person, place, and time. He appears well-developed and well-nourished.  Cardiovascular: Normal rate, normal heart sounds and intact distal pulses.   No murmur heard. Pulmonary/Chest: Effort normal and breath sounds normal. He has no wheezes. He has no rales. He exhibits no tenderness.  Abdominal: Soft. Bowel sounds are normal. He exhibits no distension and no mass. There is no tenderness.  Musculoskeletal: Normal range of motion.  Right arm PICC line  Neurological: He is alert and oriented to person, place, and time.  Psychiatric: He has a normal mood and affect.   CMP Latest Ref Rng 06/30/2015 06/29/2015 06/28/2015  Glucose 65 - 99 mg/dL - 121(H) 123(H)  BUN 6 - 20 mg/dL - 33(H) 30(H)  Creatinine 0.61 - 1.24 mg/dL 1.91(H) 1.76(H) 1.59(H)  Sodium 135 - 145 mmol/L - 137 136  Potassium 3.5 - 5.1 mmol/L - 4.1 3.6  Chloride 101 - 111 mmol/L - 104 104  CO2 22 - 32 mmol/L - 26 24  Calcium 8.9 - 10.3 mg/dL - 9.5 9.3           Assessment & Plan:  1. Chronic combined systolic and diastolic congestive heart failure (HCC) EF 20-25%, status post ICD LVAD currently on hold due to social situation No evidence of fluid overload; weight is stable Currently on milrinone infusion; advanced home care to see the patient once/  week Ms. cardiology appointment and is waiting for a new date.  2. Benign essential HTN Slight systolic elevation Current be secondary to recent bereavement. We'll hold off on making regimen changes on to his visit with cardiology. Low-sodium diet  3. CKD (chronic kidney disease) stage 3, GFR 30-59 ml/min History of right renal artery stent. Avoid nephrotoxins - On vitamin D and calcitriol - Upcoming appointment with nephrology  4. Recurrent angina status post coronary stent placement [facility]) Status post 17 stents Continue Brilinta, Praluent , Crestor Continue aggressive risk factor modification  This note has been created with Surveyor, quantity. Any transcriptional errors are unintentional.

## 2015-08-03 NOTE — Telephone Encounter (Signed)
CSW contacted patient due to no show in the clinic. Patient answered phone and stated he was unaware of his appointment on July 08, 2015. Patient reports he is feeling well and has been followed weekly by [facility] for his home milirone. Patient denies any issues at this time and states he has a follow up appointment with [place] today and would like to reschedule his HF clinic appointment. CSW assisted with new appointment for August 1st at 2:20pm. Patient grateful for assistance and will contact clinic or CSW if issues arise prior to follow up appointment. CSW will continue to follow as needed for support and resources. Raquel Sarna, LCSW [phone]

## 2015-08-03 NOTE — Patient Instructions (Signed)

## 2015-08-03 NOTE — Progress Notes (Signed)
Pt here for hospital F/U. Pt denies pain today. Pt has taken medications today.

## 2015-08-04 DIAGNOSIS — I130 Hypertensive heart and chronic kidney disease with heart failure and stage 1 through stage 4 chronic kidney disease, or unspecified chronic kidney disease: Secondary | ICD-10-CM | POA: Diagnosis not present

## 2015-08-04 DIAGNOSIS — I255 Ischemic cardiomyopathy: Secondary | ICD-10-CM | POA: Diagnosis not present

## 2015-08-04 DIAGNOSIS — I5023 Acute on chronic systolic (congestive) heart failure: Secondary | ICD-10-CM | POA: Diagnosis not present

## 2015-08-04 DIAGNOSIS — I25110 Atherosclerotic heart disease of native coronary artery with unstable angina pectoris: Secondary | ICD-10-CM | POA: Diagnosis not present

## 2015-08-04 DIAGNOSIS — N183 Chronic kidney disease, stage 3 (moderate): Secondary | ICD-10-CM | POA: Diagnosis not present

## 2015-08-04 DIAGNOSIS — I160 Hypertensive urgency: Secondary | ICD-10-CM | POA: Diagnosis not present

## 2015-08-05 ENCOUNTER — Telehealth (HOSPITAL_COMMUNITY): Payer: Self-pay | Admitting: *Deleted

## 2015-08-05 NOTE — Telephone Encounter (Signed)
Pt aware, appt sch for 7/21 at 10 am

## 2015-08-05 NOTE — Telephone Encounter (Signed)
Attempted to call pt again and got his ID VM, left mess we wanted him to have an apt tomorrow call us back asap

## 2015-08-05 NOTE — Telephone Encounter (Signed)
Received labs from [facility] drawn 7/19:  K 5.3 BUN 40 CR 2.23  Per Oda Kilts, [place] pt needs to be seen in clinic tomorrow.  Attempted to call pt and Left message to call back

## 2015-08-06 ENCOUNTER — Ambulatory Visit (INDEPENDENT_AMBULATORY_CARE_PROVIDER_SITE_OTHER): Payer: Medicare Other | Admitting: *Deleted

## 2015-08-06 ENCOUNTER — Encounter (HOSPITAL_COMMUNITY): Payer: Self-pay

## 2015-08-06 ENCOUNTER — Encounter: Payer: Self-pay | Admitting: Cardiology

## 2015-08-06 ENCOUNTER — Ambulatory Visit (HOSPITAL_COMMUNITY)
Admission: RE | Admit: 2015-08-06 | Discharge: 2015-08-06 | Disposition: A | Discharge status: Home / Self Care | Payer: Medicare Other | Source: Ambulatory Visit | Attending: Cardiology | Admitting: Cardiology

## 2015-08-06 VITALS — BP 138/94 | HR 107 | Wt 143.4 lb

## 2015-08-06 DIAGNOSIS — I5023 Acute on chronic systolic (congestive) heart failure: Secondary | ICD-10-CM | POA: Diagnosis not present

## 2015-08-06 DIAGNOSIS — I2510 Atherosclerotic heart disease of native coronary artery without angina pectoris: Secondary | ICD-10-CM

## 2015-08-06 DIAGNOSIS — Z950 Presence of cardiac pacemaker: Secondary | ICD-10-CM | POA: Diagnosis not present

## 2015-08-06 DIAGNOSIS — I252 Old myocardial infarction: Secondary | ICD-10-CM | POA: Insufficient documentation

## 2015-08-06 DIAGNOSIS — N179 Acute kidney failure, unspecified: Secondary | ICD-10-CM | POA: Diagnosis not present

## 2015-08-06 DIAGNOSIS — I255 Ischemic cardiomyopathy: Secondary | ICD-10-CM | POA: Diagnosis not present

## 2015-08-06 DIAGNOSIS — Z809 Family history of malignant neoplasm, unspecified: Secondary | ICD-10-CM | POA: Diagnosis not present

## 2015-08-06 DIAGNOSIS — Z8249 Family history of ischemic heart disease and other diseases of the circulatory system: Secondary | ICD-10-CM | POA: Insufficient documentation

## 2015-08-06 DIAGNOSIS — Z95810 Presence of automatic (implantable) cardiac defibrillator: Secondary | ICD-10-CM | POA: Diagnosis not present

## 2015-08-06 DIAGNOSIS — E785 Hyperlipidemia, unspecified: Secondary | ICD-10-CM

## 2015-08-06 DIAGNOSIS — Z7982 Long term (current) use of aspirin: Secondary | ICD-10-CM | POA: Diagnosis not present

## 2015-08-06 DIAGNOSIS — I10 Essential (primary) hypertension: Secondary | ICD-10-CM

## 2015-08-06 DIAGNOSIS — Z955 Presence of coronary angioplasty implant and graft: Secondary | ICD-10-CM | POA: Insufficient documentation

## 2015-08-06 DIAGNOSIS — N183 Chronic kidney disease, stage 3 (moderate): Secondary | ICD-10-CM | POA: Diagnosis not present

## 2015-08-06 DIAGNOSIS — I5022 Chronic systolic (congestive) heart failure: Secondary | ICD-10-CM

## 2015-08-06 DIAGNOSIS — I130 Hypertensive heart and chronic kidney disease with heart failure and stage 1 through stage 4 chronic kidney disease, or unspecified chronic kidney disease: Secondary | ICD-10-CM | POA: Insufficient documentation

## 2015-08-06 DIAGNOSIS — I110 Hypertensive heart disease with heart failure: Secondary | ICD-10-CM

## 2015-08-06 DIAGNOSIS — R000 Tachycardia, unspecified: Secondary | ICD-10-CM | POA: Insufficient documentation

## 2015-08-06 LAB — CUP PACEART REMOTE DEVICE CHECK
Brady Statistic RA Percent Paced: 1 % — CL
Date Time Interrogation Session: 20170721163017
HighPow Impedance: 51 Ohm
Implantable Lead Implant Date: 20121107
Implantable Lead Location: 753860
Implantable Lead Model: 7000
Lead Channel Impedance Value: 390 Ohm
MDC IDC LEAD IMPLANT DT: 20121107
MDC IDC LEAD LOCATION: 753859
MDC IDC MSMT LEADCHNL RA IMPEDANCE VALUE: 300 Ohm
MDC IDC MSMT LEADCHNL RA SENSING INTR AMPL: 3.4 mV
MDC IDC MSMT LEADCHNL RV SENSING INTR AMPL: 11.5 mV
MDC IDC PG SERIAL: 1033795
MDC IDC STAT BRADY RV PERCENT PACED: 1 % — AB

## 2015-08-06 LAB — BASIC METABOLIC PANEL
ANION GAP: 7 (ref 5–15)
BUN: 32 mg/dL — ABNORMAL HIGH (ref 6–20)
CALCIUM: 9.3 mg/dL (ref 8.9–10.3)
CHLORIDE: 108 mmol/L (ref 101–111)
CO2: 21 mmol/L — ABNORMAL LOW (ref 22–32)
CREATININE: 2.07 mg/dL — AB (ref 0.61–1.24)
GFR calc non Af Amer: 36 mL/min — ABNORMAL LOW (ref >60)
GFR, EST AFRICAN AMERICAN: 41 mL/min — AB (ref >60)
Glucose, Bld: 184 mg/dL — ABNORMAL HIGH (ref 65–99)
Potassium: 4.8 mmol/L (ref 3.5–5.1)
SODIUM: 136 mmol/L (ref 135–145)

## 2015-08-06 LAB — CARBOXYHEMOGLOBIN
CARBOXYHEMOGLOBIN: 0.9 % (ref 0.5–1.5)
Carboxyhemoglobin: 1 % (ref 0.5–1.5)
METHEMOGLOBIN: 0.6 % (ref 0.0–1.5)
Methemoglobin: 0.7 % (ref 0.0–1.5)
O2 SAT: 63 %
O2 SAT: 90.9 %
TOTAL HEMOGLOBIN: 14.7 g/dL (ref 13.5–18.0)
TOTAL HEMOGLOBIN: 14.9 g/dL (ref 13.5–18.0)

## 2015-08-06 LAB — BRAIN NATRIURETIC PEPTIDE: B NATRIURETIC PEPTIDE 5: 237.1 pg/mL — AB (ref 0.0–100.0)

## 2015-08-06 MED ORDER — ISOSORBIDE DINITRATE 5 MG PO TABS: 5.0000 mg | ORAL_TABLET | Freq: Three times a day (TID) | ORAL | Status: DC

## 2015-08-06 MED ORDER — HYDRALAZINE HCL 25 MG PO TABS: 12.5000 mg | ORAL_TABLET | Freq: Three times a day (TID) | ORAL | Status: DC

## 2015-08-06 NOTE — Patient Instructions (Signed)
Start Hydralazine 12.5 mg (1/2 tab) Three times a day   Start Isordil 5 mg Three times a day   Keep follow up as scheduled 08/17/15

## 2015-08-06 NOTE — Progress Notes (Addendum)
Patient ID: Marvin Martin, male   DOB: [birth year], 50 y.o.   MRN: [id number]     Referring Physician: Dr Laney Pastor Primary Care: Dr Laney Pastor Primary Cardiologist: Dr. Haroldine Laws   HPI: Marvin Martin is a 50 year old male with history of CAD, ICM, MI X4 (presents with chest burning radiating to L shoulder) with 17 stents, chronic systolic HF with EF [id number] s/p St. Jude ICD, HTN, gout, and hyperlipidemia.  He just relocated to [facility] from [place]. His medical care was provided by [facility]. He has struggled with in-stent restenosis. Most recent heart cath was February 2016 with stent placed. He was referred to HF clinic by Dr Laney Pastor to establish HF care.    Admitted May 8th through May 12th, 2017 with chest pain. Had LHC as noted below no intervention.   Repeat CPX start of June with moderate functional impairment.   Admitted June 5th through June 14th, 0000000 with A/C systolic CHF. Had repeat R/LHC as below with stable CAD and low output. Started on milrinone and weaned to 0.125 mcg/kg/min for d/c. Coreg, lasix, and spiro stopped.  Entresto dropped from full dose to 24/26 mg.   He presents today as add on after irregular labs showed K 5.2 and Creatinine up to 2.2 from 1.6-1.7 baseline. States he is feeling great.  Denies DOE.  Can now walk about a block with no SOB. Has not pushed farther with recent heat. Says energy levels is "wonderful". Denies fatigue or exhaustion. No lightheadedness or dizziness. Weight at home 136-137. SBP elevated this morning but did not take medicines. Takes them regularly otherwise. Disabled since 2006.   Corevue: Interrogated personally in office today.  Thoracic impedence above threshold.  No VT/VF.   Echo 01/30/15 EF 20-25% RV normal  R/LHC 06/23/15  Dist RCA-1 lesion, 100% stenosed. The lesion was previously treated with a stent (unknown type).  Dist RCA-2 lesion, 100% stenosed. The lesion was previously treated with a stent (unknown type).  Prox  RCA lesion, 70% stenosed. The lesion was previously treated with a stent (unknown type) and angioplasty.  Mid RCA lesion, 50% stenosed. The lesion was previously treated with a stent (unknown type).  Ost RPDA to RPDA lesion, 75% stenosed. The lesion was previously treated with a drug-eluting stent.  Mid LAD lesion, 70% stenosed.  Ost 2nd Diag lesion, 70% stenosed. Ao = 87/62 (71) LV = 81/9 RA = 2 RV = 20/0/2 PA = 24/12 (16) PCW = 4 Fick cardiac output/index = 3.1/1.8 Thermo CO/CI = 3.0/1.7 PVR = 3.9 WU Ao sat = 97% PA sat = 54%, 55%  R/LHC 05/27/15  Dist RCA-1 lesion, 100% stenosed. The lesion was previously treated with a stent (unknown type).  Dist RCA-2 lesion, 100% stenosed. The lesion was previously treated with a stent (unknown type).  Prox RCA lesion, 60% stenosed. The lesion was previously treated with a stent (unknown type) and angioplasty.  Mid RCA lesion, 40% stenosed. The lesion was previously treated with a stent (unknown type). Findings: RA = 1 RV = 22/1/5 PA = 27/13 (19) PCW = 6 Ao = 120/78 (96) LV = 124/4/13 Fick cardiac output/index = 3.3/1.9 PVR = 4.0 WU SVR = 2335  FA sat = 94% PA sat = 60%. 60%  CPX 01/15/15 FVC 2.67 (65%)    FEV1 2.18 (64%)     FEV1/FVC 81 (100%)     MVV 105 (73%) Peak VO2: 20.3 (56.3% predicted peak VO2) VE/VCO2 slope: 26.8 OUES: 1.52 Peak RER: 1.21  Labs: 12/16: K 4.0 Creatinine 1.7 Hgb 13.2 MCV 107 (denies ETOH) 05/28/2015: K 4.4 Creatinine 1.86   SH: Separated from his wife. Has 4 children. 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         Father deceased but he is unsure of the cause. He had hyperlipidemia.         Sister: Heart disease  [facility]  [address] [place] [address] [phone]   Past Medical History  Diagnosis Date  . Chronic systolic CHF (congestive heart failure) ([place])     a. 01/2015 Echo: EF 20-25%, antlat, inflat AK.  [name]  Hyperlipidemia   . Coronary artery disease     a. s/p MI x 4;  b. Reported h/o 17 stents with recurrent ISR;  c. 02/2014 Cath ([place]): PCI to unknown vessel;  d. 01/2015 MV: EF 18%, large scar, no ischemia; e. 03/2015 PCI: LM nl, LAD nl, D2 patent stent, RI patent stent, LCX patent stents p/m, RCA 95p ISR (PTCA), 49m, 100d into RPL CTO, RPDA 90 (2.5x16 Synergy DES).  . Hypertensive heart disease   . Ischemic cardiomyopathy     a. s/p SJM ICD 2007 w/ gen change in 2012; b. 12/2014 CPX Test: mild to mod HF limitation w/ pVO2 20.3 and nl slope;  c. 01/2015 Echo: EF 20-25%.  . CKD (chronic kidney disease), stage III     Current Outpatient Prescriptions  Medication Sig Dispense Refill  . Alirocumab (PRALUENT) 75 MG/ML SOPN Inject 1 pen into the skin every 14 (fourteen) days. 2 pen 11  . allopurinol (ZYLOPRIM) 100 MG tablet Take 100 mg by mouth 2 (two) times daily.     [name] amLODipine (NORVASC) 10 MG tablet Take 10 mg by mouth daily.    [name] aspirin 81 MG tablet Take 81 mg by mouth daily.    . calcitRIOL (ROCALTROL) 0.25 MCG capsule Take 0.25 mcg by mouth every other day. Take on Monday, Wednesday and Friday    . cholecalciferol (VITAMIN D) 1000 UNITS tablet Take 1,000 Units by mouth daily.    [name] ezetimibe (ZETIA) 10 MG tablet Take 1 tablet (10 mg total) by mouth daily. 30 tablet 0  . hydrALAZINE (APRESOLINE) 100 MG tablet Take 1 tablet (100 mg total) by mouth every 8 (eight) hours. 90 tablet 6  . isosorbide mononitrate (IMDUR) 120 MG 24 hr tablet Take 1 tablet (120 mg total) by mouth daily. 30 tablet 5  . milrinone (PRIMACOR) 20 MG/100 ML SOLN infusion Inject 8 mcg/min into the vein continuous. 150 mL 6  . nitroGLYCERIN (NITROSTAT) 0.4 MG SL tablet Place 1 tablet (0.4 mg total) under the tongue every 5 (five) minutes as needed for chest pain. Reported on 04/12/2015 60 tablet 1  . omega-3 acid ethyl esters (LOVAZA) 1 G capsule Take 1 g by mouth daily.     . ondansetron (ZOFRAN) 4 MG tablet Take 4 mg by mouth every  8 (eight) hours as needed for nausea or vomiting. Reported on 05/24/2015    . rosuvastatin (CRESTOR) 40 MG tablet Take 40 mg by mouth daily.    . sacubitril-valsartan (ENTRESTO) 24-26 MG Take 1 tablet by mouth 2 (two) times daily. 60 tablet 6  . ticagrelor (BRILINTA) 90 MG TABS tablet Take 1 tablet (90 mg total) by mouth 2 (two) times daily. 60 tablet 5   No current facility-administered medications for this encounter.    No Known Allergies    Social History  Social History  . Marital Status: Legally Separated    Spouse Name: N/A  . Number of Children: N/A  . Years of Education: N/A   Occupational History  . Not on file.   Social History Main Topics  . Smoking status: Never Smoker   . Smokeless tobacco: Not on file  . Alcohol Use: No  . Drug Use: No  . Sexual Activity: No   Other Topics Concern  . Not on file   Social History Narrative   Moved to [place] from [place] in 2016.  Lives alone in [place].      Family History  Problem Relation Age of Onset  . Cancer Mother     died when pt was 72.  She had a h/o heart dzs as well.  . Hypertension Mother   . Hyperlipidemia Father     Father died when pt was age 53 - he believes that he had a cardiac hx.  [name] CAD Sister     Danley Danker Vitals:   08/06/15 1006  BP: 138/94  Pulse: 107  Weight: 143 lb 6.4 oz (65.046 kg)  SpO2: 98%   Wt Readings from Last 3 Encounters:  08/06/15 143 lb 6.4 oz (65.046 kg)  08/03/15 145 lb 3.2 oz (65.862 kg)  07/06/15 147 lb 12.8 oz (67.042 kg)     PHYSICAL EXAM: General: Well appearing. HEENT: normal Neck: supple. Carotids 2+ bilat; no bruits. No thyromegaly or nodule noted. JVP flat.  Cor: PMI laterally displaced. RRR. No M/G/R apprecaited Lungs: CTAB, normal effort Abdomen: soft, NT, ND, no HSM. No bruits or masses. +BS  Extremities: no cyanosis, clubbing, rash, edema Neuro: alert & oriented x 3, cranial nerves grossly intact. moves all 4 extremities w/o difficulty. Affect pleasant. RUE  PICC  EKG: Sinus tachycardia with occasional PVCs 107 bpm.   ASSESSMENT & PLAN:  1. Chronic Systolic Heart Failure - due to iCM. EF 20-25% on echo 1/17. RV ok. Has ST Jude ICD  -Repeat CPX test with moderate functional limitation due to HF.   Functional decline.  NYHA IIIb. Volume status stable on exam and corevue. BNP not elevated.  - Now on milrinone 0.125 mcg/kg/min Coox 63% today. Will keep on for now.  - He remains tachycardic.  Will continue to hold BB with low output for now.     -Continue entresto 24/26 mg BID -Add back hydralazine 12.5 mg tid and isordil 10 mg tid.  -Refer to Cardiac rehab 2. CAD- MI X4 17 stents  Most recent [place] 06/2015 - no intervention continued medical management.    Dist RCA-1 lesion, 100% stenosed. The lesion was previously treated with a stent (unknown type).  Dist RCA-2 lesion, 100% stenosed. The lesion was previously treated with a stent (unknown type).  Prox RCA lesion, 70% stenosed. The lesion was previously treated with a stent (unknown type) and angioplasty.  Mid RCA lesion, 50% stenosed. The lesion was previously treated with a stent (unknown type).  Ost RPDA to RPDA lesion, 75% stenosed. The lesion was previously treated with a drug-eluting stent.  Mid LAD lesion, 70% stenosed.  Ost 2nd Diag lesion, 70% stenosed. - Continue ASA, statin, ticagrelor. Off BB with low output.  3. HTN - Remains elevated. - Med changes as above.  4. Hyperlipidemia - Continue crestor 40 mg daily. 5. AKI CKD stage III  BMET today with creatinine still mildly elevated.  Think he may actually be slightly dry.  Will have him drink extra fluid for the next 1-2 days.  -  Keep entrestro for now. Recheck BMET next visit.   Discussed case with Dr. Aundra Dubin.  Will add back hydralazine/isordil as above. Keep appt for 10-14 days with repeat labs.   Satira Mccallum Shermaine Rivet PA-C  10:23 AM  Total time spent > 40 minutes. Over half that spent discussing the above.

## 2015-08-06 NOTE — Progress Notes (Signed)
Remote ICD transmission.   

## 2015-08-11 DIAGNOSIS — N183 Chronic kidney disease, stage 3 (moderate): Secondary | ICD-10-CM | POA: Diagnosis not present

## 2015-08-11 DIAGNOSIS — I25110 Atherosclerotic heart disease of native coronary artery with unstable angina pectoris: Secondary | ICD-10-CM | POA: Diagnosis not present

## 2015-08-11 DIAGNOSIS — I160 Hypertensive urgency: Secondary | ICD-10-CM | POA: Diagnosis not present

## 2015-08-11 DIAGNOSIS — I255 Ischemic cardiomyopathy: Secondary | ICD-10-CM | POA: Diagnosis not present

## 2015-08-11 DIAGNOSIS — I5023 Acute on chronic systolic (congestive) heart failure: Secondary | ICD-10-CM | POA: Diagnosis not present

## 2015-08-11 DIAGNOSIS — I130 Hypertensive heart and chronic kidney disease with heart failure and stage 1 through stage 4 chronic kidney disease, or unspecified chronic kidney disease: Secondary | ICD-10-CM | POA: Diagnosis not present

## 2015-08-16 ENCOUNTER — Other Ambulatory Visit (HOSPITAL_COMMUNITY): Payer: Self-pay | Admitting: Internal Medicine

## 2015-08-17 ENCOUNTER — Encounter: Payer: Self-pay | Admitting: Licensed Clinical Social Worker

## 2015-08-17 ENCOUNTER — Ambulatory Visit (HOSPITAL_COMMUNITY)
Admission: RE | Admit: 2015-08-17 | Discharge: 2015-08-17 | Disposition: A | Discharge status: Home / Self Care | Payer: Medicare Other | Source: Ambulatory Visit | Attending: Cardiology | Admitting: Cardiology

## 2015-08-17 ENCOUNTER — Encounter (HOSPITAL_COMMUNITY): Payer: Self-pay

## 2015-08-17 VITALS — BP 154/108 | HR 113 | Wt 145.6 lb

## 2015-08-17 DIAGNOSIS — I252 Old myocardial infarction: Secondary | ICD-10-CM | POA: Insufficient documentation

## 2015-08-17 DIAGNOSIS — Z8249 Family history of ischemic heart disease and other diseases of the circulatory system: Secondary | ICD-10-CM | POA: Insufficient documentation

## 2015-08-17 DIAGNOSIS — N183 Chronic kidney disease, stage 3 (moderate): Secondary | ICD-10-CM | POA: Diagnosis not present

## 2015-08-17 DIAGNOSIS — I255 Ischemic cardiomyopathy: Secondary | ICD-10-CM | POA: Diagnosis not present

## 2015-08-17 DIAGNOSIS — I5022 Chronic systolic (congestive) heart failure: Secondary | ICD-10-CM | POA: Insufficient documentation

## 2015-08-17 DIAGNOSIS — Z79899 Other long term (current) drug therapy: Secondary | ICD-10-CM | POA: Insufficient documentation

## 2015-08-17 DIAGNOSIS — Z955 Presence of coronary angioplasty implant and graft: Secondary | ICD-10-CM | POA: Insufficient documentation

## 2015-08-17 DIAGNOSIS — E785 Hyperlipidemia, unspecified: Secondary | ICD-10-CM | POA: Diagnosis not present

## 2015-08-17 DIAGNOSIS — I2510 Atherosclerotic heart disease of native coronary artery without angina pectoris: Secondary | ICD-10-CM

## 2015-08-17 DIAGNOSIS — Z7982 Long term (current) use of aspirin: Secondary | ICD-10-CM | POA: Diagnosis not present

## 2015-08-17 DIAGNOSIS — I5042 Chronic combined systolic (congestive) and diastolic (congestive) heart failure: Secondary | ICD-10-CM | POA: Diagnosis not present

## 2015-08-17 DIAGNOSIS — Z95810 Presence of automatic (implantable) cardiac defibrillator: Secondary | ICD-10-CM | POA: Diagnosis not present

## 2015-08-17 DIAGNOSIS — I110 Hypertensive heart disease with heart failure: Secondary | ICD-10-CM

## 2015-08-17 DIAGNOSIS — I130 Hypertensive heart and chronic kidney disease with heart failure and stage 1 through stage 4 chronic kidney disease, or unspecified chronic kidney disease: Secondary | ICD-10-CM | POA: Diagnosis not present

## 2015-08-17 LAB — CARBOXYHEMOGLOBIN
Carboxyhemoglobin: 1.4 % (ref 0.5–1.5)
METHEMOGLOBIN: 0.7 % (ref 0.0–1.5)
O2 SAT: 67.6 %
TOTAL HEMOGLOBIN: 13.6 g/dL (ref 13.5–18.0)

## 2015-08-17 MED ORDER — HYDRALAZINE HCL 100 MG PO TABS: 100.0000 mg | ORAL_TABLET | Freq: Three times a day (TID) | ORAL | 3 refills | Status: DC

## 2015-08-17 NOTE — Progress Notes (Signed)
Patient ID: Marvin Martin, male   DOB: [birth year], 50 y.o.   MRN: [id number]     Referring Physician: Dr Laney Pastor Primary Care: Dr Laney Pastor Primary Cardiologist: Dr. Haroldine Laws   HPI: Marvin Martin is a 50 year old male with history of CAD, ICM, MI X4 (presents with chest burning radiating to L shoulder) with 17 stents, chronic systolic HF with EF [id number] s/p St. Jude ICD, HTN, gout, and hyperlipidemia.  He just relocated to [facility] from [place]. His medical care was provided by [facility]. He has struggled with in-stent restenosis. Most recent heart cath was February 2016 with stent placed. He was referred to HF clinic by Dr Laney Pastor to establish HF care.    Admitted May 8th through May 12th, 2017 with chest pain. Had LHC as noted below no intervention.   Repeat CPX start of June with moderate functional impairment.   Admitted June 5th through June 14th, 0000000 with A/C systolic CHF. Had repeat R/LHC as below with stable CAD and low output. Started on milrinone and weaned to 0.125 mcg/kg/min for d/c. Coreg, lasix, and spiro stopped.  Entresto dropped from full dose to 24/26 mg.   He returns for HF follow up. Overall he is feeling ok. Denies SOB/PND/Orthopnea. Able to walk up 9 steps. He ins not sure which medications he is taking. He has a list but says he is taking isordil and imdur. He cant verify which dose of hydralazine he is taking. [facility] following. Disabled since 2006.   Corvue: Impedance trending down.    EF 20-25% RV normal  [facility] 06/23/15  Dist RCA-1 lesion, 100% stenosed. The lesion was previously treated with a stent (unknown type).  Dist RCA-2 lesion, 100% stenosed. The lesion was previously treated with a stent (unknown type).  Prox RCA lesion, 70% stenosed. The lesion was previously treated with a stent (unknown type) and angioplasty.  Mid RCA lesion, 50% stenosed. The lesion was previously treated with a stent (unknown type).  Ost RPDA to RPDA lesion, 75%  stenosed. The lesion was previously treated with a drug-eluting stent.  Mid LAD lesion, 70% stenosed.  Ost 2nd Diag lesion, 70% stenosed. Ao = 87/62 (71) LV = 81/9 RA = 2 RV = 20/0/2 PA = 24/12 (16) PCW = 4 Fick cardiac output/index = 3.1/1.8 Thermo CO/CI = 3.0/1.7 PVR = 3.9 WU Ao sat = 97% PA sat = 54%, 55%  R/LHC 05/27/15  Dist RCA-1 lesion, 100% stenosed. The lesion was previously treated with a stent (unknown type).  Dist RCA-2 lesion, 100% stenosed. The lesion was previously treated with a stent (unknown type).  Prox RCA lesion, 60% stenosed. The lesion was previously treated with a stent (unknown type) and angioplasty.  Mid RCA lesion, 40% stenosed. The lesion was previously treated with a stent (unknown type). Findings: RA = 1 RV = 22/1/5 PA = 27/13 (19) PCW = 6 Ao = 120/78 (96) LV = 124/4/13 Fick cardiac output/index = 3.3/1.9 PVR = 4.0 WU SVR = 2335  FA sat = 94% PA sat = 60%. 60%  CPX 01/15/15 FVC 2.67 (65%)    FEV1 2.18 (64%)     FEV1/FVC 81 (100%)     MVV 105 (73%) Peak VO2: 20.3 (56.3% predicted peak VO2) VE/VCO2 slope: 26.8 OUES: 1.52 Peak RER: 1.21  Labs: 12/16: K 4.0 Creatinine 1.7 Hgb 13.2 MCV 107 (denies ETOH) 05/28/2015: K 4.4 Creatinine 1.86 08/06/2015:    SH: Separated from his wife. Has 4 children. Lives alone. 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 .  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         Father deceased but he is unsure of the cause. He had hyperlipidemia.         Sister: Heart disease  [facility]  [address] [place] [address] [phone]   Past Medical History:  Diagnosis Date  . Chronic systolic CHF (congestive heart failure) ([place])    a. 01/2015 Echo: EF 20-25%, antlat, inflat AK.  [name] CKD (chronic kidney disease), stage III   . Coronary artery disease    a. s/p MI x 4;  b. Reported h/o 17 stents with recurrent ISR;  c. 02/2014 Cath ([place]): PCI to unknown vessel;  d. 01/2015 MV: EF 18%, large scar, no  ischemia; e. 03/2015 PCI: LM nl, LAD nl, D2 patent stent, RI patent stent, LCX patent stents p/m, RCA 95p ISR (PTCA), 10m, 100d into RPL CTO, RPDA 90 (2.5x16 Synergy DES).  . Hyperlipidemia   . Hypertensive heart disease   . Ischemic cardiomyopathy    a. s/p SJM ICD 2007 w/ gen change in 2012; b. 12/2014 CPX Test: mild to mod HF limitation w/ pVO2 20.3 and nl slope;  c. 01/2015 Echo: EF 20-25%.    Current Outpatient Prescriptions  Medication Sig Dispense Refill  . Alirocumab (PRALUENT) 75 MG/ML SOPN Inject 1 pen into the skin every 14 (fourteen) days. 2 pen 11  . allopurinol (ZYLOPRIM) 100 MG tablet Take 100 mg by mouth 2 (two) times daily.     [name] amLODipine (NORVASC) 10 MG tablet Take 10 mg by mouth daily.    [name] aspirin 81 MG tablet Take 81 mg by mouth daily.    . calcitRIOL (ROCALTROL) 0.25 MCG capsule Take 0.25 mcg by mouth every other day. Take on Monday, Wednesday and Friday    . cholecalciferol (VITAMIN D) 1000 UNITS tablet Take 1,000 Units by mouth daily.    [name] ezetimibe (ZETIA) 10 MG tablet Take 1 tablet (10 mg total) by mouth daily. 30 tablet 0  . hydrALAZINE (APRESOLINE) 25 MG tablet Take 0.5 tablets (12.5 mg total) by mouth 3 (three) times daily. 45 tablet 3  . isosorbide dinitrate (ISORDIL) 5 MG tablet Take 1 tablet (5 mg total) by mouth 3 (three) times daily. 90 tablet 3  . isosorbide mononitrate (IMDUR) 120 MG 24 hr tablet Take 1 tablet (120 mg total) by mouth daily. 30 tablet 5  . milrinone (PRIMACOR) 20 MG/100 ML SOLN infusion Inject 8 mcg/min into the vein continuous. 150 mL 6  . omega-3 acid ethyl esters (LOVAZA) 1 G capsule Take 1 g by mouth daily.     . rosuvastatin (CRESTOR) 40 MG tablet Take 40 mg by mouth daily.    . sacubitril-valsartan (ENTRESTO) 24-26 MG Take 1 tablet by mouth 2 (two) times daily. 60 tablet 6  . ticagrelor (BRILINTA) 90 MG TABS tablet Take 1 tablet (90 mg total) by mouth 2 (two) times daily. 60 tablet 5  . nitroGLYCERIN (NITROSTAT) 0.4 MG SL tablet Place  1 tablet (0.4 mg total) under the tongue every 5 (five) minutes as needed for chest pain. Reported on 04/12/2015 (Patient not taking: Reported on 08/17/2015) 60 tablet 1  . ondansetron (ZOFRAN) 4 MG tablet Take 4 mg by mouth every 8 (eight) hours as needed for nausea or vomiting. Reported on 05/24/2015     No current facility-administered medications for this encounter.     No Known Allergies    Social History   Social History  .  Marital status: Legally Separated    Spouse name: N/A  . Number of children: N/A  . Years of education: N/A   Occupational History  . Not on file.   Social History Main Topics  . Smoking status: Never Smoker  . Smokeless tobacco: Not on file  . Alcohol use No  . Drug use: No  . Sexual activity: No   Other Topics Concern  . Not on file   Social History Narrative   Moved to [place] from [place] in 2016.  Lives alone in [place].      Family History  Problem Relation Age of Onset  . Cancer Mother     died when pt was 51.  She had a h/o heart dzs as well.  . Hypertension Mother   . Hyperlipidemia Father     Father died when pt was age 1 - he believes that he had a cardiac hx.  [name] CAD Sister     Vitals:   08/17/15 1421  BP: (!) 154/108  Pulse: (!) 113  SpO2: 93%  Weight: 145 lb 9.6 oz (66 kg)   Wt Readings from Last 3 Encounters:  08/17/15 145 lb 9.6 oz (66 kg)  08/06/15 143 lb 6.4 oz (65 kg)  08/03/15 145 lb 3.2 oz (65.9 kg)     PHYSICAL EXAM: General: Well appearing. Ambulated in the clinic without difficulty.  HEENT: normal Neck: supple. Carotids 2+ bilat; no bruits. No thyromegaly or nodule noted. JVP : 7-8  Cor: PMI laterally displaced. RRR. No M/G/R apprecaited Lungs: CTAB, normal effort Abdomen: soft, NT, ND, no HSM. No bruits or masses. +BS  Extremities: no cyanosis, clubbing, rash, edema RUE PICC Neuro: alert & oriented x 3, cranial nerves grossly intact. moves all 4 extremities w/o difficulty. Affect pleasant. RUE PICC  ASSESSMENT &  PLAN:  1. Chronic Systolic Heart Failure - due to iCM. EF 20-25% on echo 1/17. RV ok. Has ST Jude ICD  -Repeat CPX test with moderate functional limitation due to HF.   Functional decline.  NYHA IIIb. Volume status stable on exam and corevue. BNP not elevated.  - Todays CO-OX is 67%. Will stop milrinone and repeat CO-OX in a week. Continue PICC for another week.  - He remains tachycardic.  Will continue to hold BB with low output for now.    -Continue entresto 24/26 mg BID -- Today I have asked him to take hydralazine 100 mg three times a day and imdur 120 mg daily.  - Today I am not sure which medications he is taking so tomorrow he will follow up with pharmacy. He was instructed to stop isordil and stop hydralazine 12.5 mg.   -Refer to Cardiac rehab 2. CAD- MI X4 17 stents  Most recent [place] 06/2015 - no intervention continued medical management.    Dist RCA-1 lesion, 100% stenosed. The lesion was previously treated with a stent (unknown type).  Dist RCA-2 lesion, 100% stenosed. The lesion was previously treated with a stent (unknown type).  Prox RCA lesion, 70% stenosed. The lesion was previously treated with a stent (unknown type) and angioplasty.  Mid RCA lesion, 50% stenosed. The lesion was previously treated with a stent (unknown type).  Ost RPDA to RPDA lesion, 75% stenosed. The lesion was previously treated with a drug-eluting stent.  Mid LAD lesion, 70% stenosed.  Ost 2nd Diag lesion, 70% stenosed. - Continue ASA, statin, ticagrelor. Off BB with low output.  3. HTN - Remains elevated. - Med changes as above.  4. Hyperlipidemia - Continue crestor 40 mg daily. 5. CKD stage III   Follow up tomorrow with pharmacy. He was asked to bring all medications to his appointment. Follow up next week for repeat CO-OX      Cathrine Krizan NP-C  3:03 PM

## 2015-08-17 NOTE — Patient Instructions (Addendum)
Return to see the HF pharmacist tomorrow 08/18/15 @ 2 :30pm  START Hydralazine 100 mg, one tab three times per day START Isosorbide Mononitrate 120 mg, one tab daily STOP Isosorbide Dinitrate   Your physician recommends that you schedule a follow-up appointment in: 2 weeks with the NP/PA

## 2015-08-17 NOTE — Progress Notes (Signed)
Advanced Heart Failure Medication Review by a Pharmacist  Does the patient  feel that his/her medications are working for him/her?  yes  Has the patient been experiencing any side effects to the medications prescribed?  no  Does the patient measure his/her own blood pressure or blood glucose at home?  no   Does the patient have any problems obtaining medications due to transportation or finances?   no  Understanding of regimen: fair Understanding of indications: fair Potential of compliance: good Patient understands to avoid NSAIDs. Patient understands to avoid decongestants.  Issues to address at subsequent visits: None   Pharmacist comments:  Marvin Martin is a pleasant 50 yo M presenting without a medication list but able to verbalize each of his medications to me. He reports good compliance with his regimen but I did notice that he had isosorbide mononitrate and dinitrate on his list (both confirmed picked up from pharmacist within the last month) and he thinks he's probably been taking both. I did verify that the isosorbide dinitrate is covered by his [employer] Part D at no charge to him. Will relay info to Darrick Grinder, NP and call pharmacy to discontinue one of the formulations.   Ruta Hinds. Velva Harman, PharmD, BCPS, CPP Clinical Pharmacist Pager: [phone] Phone: [phone] 08/17/2015 3:02 PM      Time with patient: 10 minutes Preparation and documentation time: 8 minutes Total time: 18 minutes

## 2015-08-18 ENCOUNTER — Telehealth (HOSPITAL_COMMUNITY): Payer: Self-pay

## 2015-08-18 ENCOUNTER — Ambulatory Visit (HOSPITAL_COMMUNITY)
Admission: RE | Admit: 2015-08-18 | Discharge: 2015-08-18 | Disposition: A | Discharge status: Home / Self Care | Payer: Medicare Other | Source: Ambulatory Visit | Attending: Cardiology | Admitting: Cardiology

## 2015-08-18 DIAGNOSIS — Z79899 Other long term (current) drug therapy: Secondary | ICD-10-CM | POA: Diagnosis not present

## 2015-08-18 DIAGNOSIS — Z955 Presence of coronary angioplasty implant and graft: Secondary | ICD-10-CM | POA: Insufficient documentation

## 2015-08-18 DIAGNOSIS — I25110 Atherosclerotic heart disease of native coronary artery with unstable angina pectoris: Secondary | ICD-10-CM | POA: Diagnosis not present

## 2015-08-18 DIAGNOSIS — E785 Hyperlipidemia, unspecified: Secondary | ICD-10-CM | POA: Insufficient documentation

## 2015-08-18 DIAGNOSIS — I252 Old myocardial infarction: Secondary | ICD-10-CM | POA: Insufficient documentation

## 2015-08-18 DIAGNOSIS — I130 Hypertensive heart and chronic kidney disease with heart failure and stage 1 through stage 4 chronic kidney disease, or unspecified chronic kidney disease: Secondary | ICD-10-CM | POA: Insufficient documentation

## 2015-08-18 DIAGNOSIS — Z95810 Presence of automatic (implantable) cardiac defibrillator: Secondary | ICD-10-CM | POA: Insufficient documentation

## 2015-08-18 DIAGNOSIS — I5022 Chronic systolic (congestive) heart failure: Secondary | ICD-10-CM

## 2015-08-18 DIAGNOSIS — I5023 Acute on chronic systolic (congestive) heart failure: Secondary | ICD-10-CM | POA: Diagnosis not present

## 2015-08-18 DIAGNOSIS — Z7902 Long term (current) use of antithrombotics/antiplatelets: Secondary | ICD-10-CM | POA: Insufficient documentation

## 2015-08-18 DIAGNOSIS — Z7982 Long term (current) use of aspirin: Secondary | ICD-10-CM | POA: Insufficient documentation

## 2015-08-18 DIAGNOSIS — I255 Ischemic cardiomyopathy: Secondary | ICD-10-CM | POA: Diagnosis not present

## 2015-08-18 DIAGNOSIS — N183 Chronic kidney disease, stage 3 (moderate): Secondary | ICD-10-CM | POA: Insufficient documentation

## 2015-08-18 DIAGNOSIS — I2510 Atherosclerotic heart disease of native coronary artery without angina pectoris: Secondary | ICD-10-CM | POA: Diagnosis not present

## 2015-08-18 DIAGNOSIS — I160 Hypertensive urgency: Secondary | ICD-10-CM | POA: Diagnosis not present

## 2015-08-18 MED ORDER — ROSUVASTATIN CALCIUM 40 MG PO TABS: 40.0000 mg | ORAL_TABLET | Freq: Every day | ORAL | 3 refills | Status: DC

## 2015-08-18 MED ORDER — AMLODIPINE BESYLATE 5 MG PO TABS: 5.0000 mg | ORAL_TABLET | Freq: Every day | ORAL | 3 refills | Status: DC

## 2015-08-18 NOTE — Telephone Encounter (Signed)
Patient made aware of coox results.  Per Amy Clegg NP-C, advised that milrinone drip can be discontinued today by [facility] RN. Follow up made for 08/27/15 to recheck coox 1 week after milrinone stopped. Patient thrilled and agreeable to plan as stated above.  Renee Pain

## 2015-08-18 NOTE — Patient Instructions (Signed)
Please RESTART Crestor (rosuvastatin) 40 mg (1 tablet) ONCE daily. RESTART amlodipine 5 mg (1 tablet) ONCE daily.   Please keep your lab appointment on 8/11 and follow up appointment on 8/15.

## 2015-08-18 NOTE — Progress Notes (Signed)
HPI:  [name] is a 50 yo AA male with history of CAD, ICM, MI X4 (presents with chest burning radiating to L shoulder) with 17 stents, chronic systolic HF with EF [id number] s/p St. Jude ICD, HTN, gout, and hyperlipidemia. He just relocated to [facility] from [place]. His medical care was provided by [facility]. He has struggled with in-stent restenosis. Most recent heart cath was February 2016 with stent placed. He was referred to HF clinic by Dr Laney Pastor to establish HF care.    Admitted June 5th through June 14th, 0000000 with A/C systolic CHF. Had repeat R/LHC as below with stable CAD and low output. Started on milrinone and weaned to 0.125 mcg/kg/min for d/c. Coreg, lasix, and spiro stopped.  Entresto dropped from full dose to 24/26 mg.   He returns today for pharmacist-led HF medication titration and reconciliation. He was seen in NP clinic yesterday and multiple medication discrepancies noted so here today to reconcile those. He did bring all of his medication bottles with him today and assured me that he does not forget to take any of his medications. His milrinone was d/c'd yesterday but no other medication changes made. Overall he is feeling ok and is excited that his milrinone was stopped yesterday. Denies SOB/PND/Orthopnea. [facility] following. Disabled since 2006.     [name] Shortness of breath/dyspnea on exertion? no  . Orthopnea/PND? no . Edema? no . Lightheadedness/dizziness? no . Daily weights at home? Yes - 135-136 lb . Blood pressure/heart rate monitoring at home? no . Following low-sodium/fluid-restricted diet? Yes - trying to stay away from sodium and < 2 L fluid/day  HF Medications: Hydralazine 100 mg PO TID Isosorbide mononitrate 120 mg PO daily Entresto 24-26 mg PO BID  **No BB with low output and recent d/c of milrinone  Has the patient been experiencing any side effects to the medications prescribed?  no  Does the patient have any problems obtaining medications due to  transportation or finances?   No - Medicare Part D (Brilinta and Entresto $0 copay)  Understanding of regimen: fair Understanding of indications: fair Potential of compliance: good Patient understands to avoid NSAIDs. Patient understands to avoid decongestants.    Pertinent Lab Values: . 08/06/15: Serum creatinine 2.07 (BL ~1.7), BUN 32, Potassium 4.8, Sodium 136, BNP 237 . 08/17/15: Coox 67% on milrinone 0.125 mcg/kg/min  Vital Signs: . Weight: 145 lb (dry weight: 140 lb) . Blood pressure: 142/92 mmHg  . Heart rate: 97 bpm  Assessment: 1. Chronicsystolic CHF (EF 0000000), due to ICM. NYHA class IIIsymptoms. - Volume status seems stable today - Continue hydralazine 100 mg TID, isosorbide mononitrate 120 mg daily, Entresto 24-26 mg BID - Remains off milrinone with coox recheck next week - Remains tachycardic although improved off milrinone. Continue to hold off on BB for now with recent milrinone d/c and low output. Consider restart at next clinic visit if coox remains wnl and stable.  - Basic disease state pathophysiology, medication indication, mechanism and side effects reviewed at length with patient and he verbalized understanding 2. CAD - MI x 4  (17 stents) - Continue ASA 81 mg daily, Brilinta 90 mg BID, Praluent 75 mg q14 days, Zetia 10 mg daily - Will restart Crestor 40 mg daily. He was not sure why he stopped taking it. Has had issues with Lipitor causing bilateral LE muscle pains but not with Crestor that he can remember.  3. HTN - Continue hydralazine, isosorbide mononitrate, Entresto - Restart amlodipine at 5  mg daily. Was on 10 mg daily in the past and was not sure why he stopped it.  4. HLD - Continue Praluent, Zetia - Restart Crestor as above 5. CKDIII - Last SCr above baseline  - Continue to monitor   Plan: 1) Medication changes: Based on clinical presentation, vital signs and recent labs will restart Crestor 40 mg daily and amlodipine 5 mg daily 2) Labs:  PRN 3) Follow-up: Amy on 8/15   Erika K. Velva Harman, PharmD, BCPS, CPP Clinical Pharmacist Pager: [phone] Phone: [phone] 08/18/2015 2:29 PM   Agree with PharmD note as above. Continue to titrate HF meds as tolerated.   Marvin Isaacson,MD 10:40 PM

## 2015-08-18 NOTE — Progress Notes (Signed)
CSW met with patient in the clinic. Patient missed prior appointment and stated that he was out of town for a funeral and forgot about it. Patient states he is managing well and compliant with his medications. He states he is feeling well on the milrinone and that they homecare RN will be out tomorrow to replace the medication. CSW offered support and will continue to follow for needs. Raquel Sarna, LCSW [phone]

## 2015-08-19 ENCOUNTER — Other Ambulatory Visit: Payer: Self-pay | Admitting: Family Medicine

## 2015-08-19 ENCOUNTER — Other Ambulatory Visit (HOSPITAL_COMMUNITY): Payer: Self-pay | Admitting: Internal Medicine

## 2015-08-23 ENCOUNTER — Telehealth (HOSPITAL_COMMUNITY): Payer: Self-pay | Admitting: *Deleted

## 2015-08-23 DIAGNOSIS — I509 Heart failure, unspecified: Secondary | ICD-10-CM | POA: Diagnosis not present

## 2015-08-23 DIAGNOSIS — I2510 Atherosclerotic heart disease of native coronary artery without angina pectoris: Secondary | ICD-10-CM | POA: Diagnosis not present

## 2015-08-23 DIAGNOSIS — I10 Essential (primary) hypertension: Secondary | ICD-10-CM | POA: Diagnosis not present

## 2015-08-23 DIAGNOSIS — N183 Chronic kidney disease, stage 3 (moderate): Secondary | ICD-10-CM | POA: Diagnosis not present

## 2015-08-23 NOTE — Telephone Encounter (Signed)
Per Dr Vaughan Browner, he spoke w/renal MD and pt's CR is up to 4.4 and they have stopped Entresto.  He would like pt to get co-ox and restart Milrinone at prev dose of 0.125 mcg/kg/min.  Have attempted to call pt with instructions and left him a mess to call back.  Have called and spoke w/[facility], they will get milrinone restarted tomorrow after we obtain co-ox, order faxed to pharmacy at [phone]

## 2015-08-23 NOTE — Telephone Encounter (Signed)
Spoke w/pt he will be here at 9 am tomorrow for co-ox, Jeannene Patella, RN w/[facility] will meet him here to restart milrinone

## 2015-08-24 ENCOUNTER — Ambulatory Visit (HOSPITAL_COMMUNITY)
Admission: RE | Admit: 2015-08-24 | Discharge: 2015-08-24 | Disposition: A | Discharge status: Home / Self Care | Payer: Medicare Other | Source: Ambulatory Visit | Attending: Internal Medicine | Admitting: Internal Medicine

## 2015-08-24 VITALS — BP 92/68 | HR 102 | Wt 143.8 lb

## 2015-08-24 DIAGNOSIS — I5022 Chronic systolic (congestive) heart failure: Secondary | ICD-10-CM | POA: Diagnosis not present

## 2015-08-24 DIAGNOSIS — I5023 Acute on chronic systolic (congestive) heart failure: Secondary | ICD-10-CM

## 2015-08-24 LAB — CARBOXYHEMOGLOBIN
CARBOXYHEMOGLOBIN: 0.9 % (ref 0.5–1.5)
METHEMOGLOBIN: 0.7 % (ref 0.0–1.5)
O2 SAT: 68.5 %
TOTAL HEMOGLOBIN: 13.8 g/dL (ref 13.5–18.0)

## 2015-08-24 MED ORDER — AMBULATORY NON FORMULARY MEDICATION: 0.1250 ug/kg/min | Status: DC

## 2015-08-24 NOTE — Progress Notes (Signed)
Co-ox and VS reviewed w/Dr Bensimhon, he orders milrinone be restarted w/repeat co-ox and bmet on Fri 8/11, pt aware and agreeable.  Home milrinone restarted by Carolynn Sayers, RN with [facility].

## 2015-08-24 NOTE — Patient Instructions (Signed)
Return on Friday for labs

## 2015-08-25 DIAGNOSIS — I160 Hypertensive urgency: Secondary | ICD-10-CM | POA: Diagnosis not present

## 2015-08-25 DIAGNOSIS — I255 Ischemic cardiomyopathy: Secondary | ICD-10-CM | POA: Diagnosis not present

## 2015-08-25 DIAGNOSIS — I5023 Acute on chronic systolic (congestive) heart failure: Secondary | ICD-10-CM | POA: Diagnosis not present

## 2015-08-25 DIAGNOSIS — I25110 Atherosclerotic heart disease of native coronary artery with unstable angina pectoris: Secondary | ICD-10-CM | POA: Diagnosis not present

## 2015-08-25 DIAGNOSIS — I10 Essential (primary) hypertension: Secondary | ICD-10-CM | POA: Diagnosis not present

## 2015-08-25 DIAGNOSIS — N183 Chronic kidney disease, stage 3 (moderate): Secondary | ICD-10-CM | POA: Diagnosis not present

## 2015-08-25 DIAGNOSIS — I130 Hypertensive heart and chronic kidney disease with heart failure and stage 1 through stage 4 chronic kidney disease, or unspecified chronic kidney disease: Secondary | ICD-10-CM | POA: Diagnosis not present

## 2015-08-27 ENCOUNTER — Ambulatory Visit (HOSPITAL_COMMUNITY)
Admission: RE | Admit: 2015-08-27 | Discharge: 2015-08-27 | Disposition: A | Discharge status: Home / Self Care | Payer: Medicare Other | Source: Ambulatory Visit | Attending: Internal Medicine | Admitting: Internal Medicine

## 2015-08-27 ENCOUNTER — Other Ambulatory Visit (HOSPITAL_COMMUNITY): Payer: Medicare Other

## 2015-08-27 DIAGNOSIS — I5023 Acute on chronic systolic (congestive) heart failure: Secondary | ICD-10-CM

## 2015-08-27 DIAGNOSIS — I5022 Chronic systolic (congestive) heart failure: Secondary | ICD-10-CM | POA: Insufficient documentation

## 2015-08-27 LAB — BASIC METABOLIC PANEL
Anion gap: 10 (ref 5–15)
BUN: 63 mg/dL — AB (ref 6–20)
CHLORIDE: 103 mmol/L (ref 101–111)
CO2: 24 mmol/L (ref 22–32)
CREATININE: 2.6 mg/dL — AB (ref 0.61–1.24)
Calcium: 9.5 mg/dL (ref 8.9–10.3)
GFR calc Af Amer: 31 mL/min — ABNORMAL LOW (ref >60)
GFR calc non Af Amer: 27 mL/min — ABNORMAL LOW (ref >60)
GLUCOSE: 204 mg/dL — AB (ref 65–99)
Potassium: 4.6 mmol/L (ref 3.5–5.1)
Sodium: 137 mmol/L (ref 135–145)

## 2015-08-27 LAB — CARBOXYHEMOGLOBIN
Carboxyhemoglobin: 1.5 % (ref 0.5–1.5)
Methemoglobin: 0.8 % (ref 0.0–1.5)
O2 SAT: 68.1 %
TOTAL HEMOGLOBIN: 13.3 g/dL — AB (ref 13.5–18.0)

## 2015-08-30 DIAGNOSIS — Z452 Encounter for adjustment and management of vascular access device: Secondary | ICD-10-CM | POA: Diagnosis not present

## 2015-08-30 DIAGNOSIS — Z955 Presence of coronary angioplasty implant and graft: Secondary | ICD-10-CM | POA: Diagnosis not present

## 2015-08-30 DIAGNOSIS — N183 Chronic kidney disease, stage 3 (moderate): Secondary | ICD-10-CM | POA: Diagnosis not present

## 2015-08-30 DIAGNOSIS — I160 Hypertensive urgency: Secondary | ICD-10-CM | POA: Diagnosis not present

## 2015-08-30 DIAGNOSIS — Z950 Presence of cardiac pacemaker: Secondary | ICD-10-CM | POA: Diagnosis not present

## 2015-08-30 DIAGNOSIS — I252 Old myocardial infarction: Secondary | ICD-10-CM | POA: Diagnosis not present

## 2015-08-30 DIAGNOSIS — E785 Hyperlipidemia, unspecified: Secondary | ICD-10-CM | POA: Diagnosis not present

## 2015-08-30 DIAGNOSIS — I130 Hypertensive heart and chronic kidney disease with heart failure and stage 1 through stage 4 chronic kidney disease, or unspecified chronic kidney disease: Secondary | ICD-10-CM | POA: Diagnosis not present

## 2015-08-30 DIAGNOSIS — Z7901 Long term (current) use of anticoagulants: Secondary | ICD-10-CM | POA: Diagnosis not present

## 2015-08-30 DIAGNOSIS — Z5181 Encounter for therapeutic drug level monitoring: Secondary | ICD-10-CM | POA: Diagnosis not present

## 2015-08-30 DIAGNOSIS — I5023 Acute on chronic systolic (congestive) heart failure: Secondary | ICD-10-CM | POA: Diagnosis not present

## 2015-08-30 DIAGNOSIS — I255 Ischemic cardiomyopathy: Secondary | ICD-10-CM | POA: Diagnosis not present

## 2015-08-30 DIAGNOSIS — I25110 Atherosclerotic heart disease of native coronary artery with unstable angina pectoris: Secondary | ICD-10-CM | POA: Diagnosis not present

## 2015-08-31 ENCOUNTER — Encounter (HOSPITAL_COMMUNITY): Payer: Medicare Other

## 2015-08-31 DIAGNOSIS — I255 Ischemic cardiomyopathy: Secondary | ICD-10-CM | POA: Diagnosis not present

## 2015-08-31 DIAGNOSIS — I130 Hypertensive heart and chronic kidney disease with heart failure and stage 1 through stage 4 chronic kidney disease, or unspecified chronic kidney disease: Secondary | ICD-10-CM | POA: Diagnosis not present

## 2015-08-31 DIAGNOSIS — N183 Chronic kidney disease, stage 3 (moderate): Secondary | ICD-10-CM | POA: Diagnosis not present

## 2015-08-31 DIAGNOSIS — I160 Hypertensive urgency: Secondary | ICD-10-CM | POA: Diagnosis not present

## 2015-08-31 DIAGNOSIS — I5023 Acute on chronic systolic (congestive) heart failure: Secondary | ICD-10-CM | POA: Diagnosis not present

## 2015-08-31 DIAGNOSIS — I25110 Atherosclerotic heart disease of native coronary artery with unstable angina pectoris: Secondary | ICD-10-CM | POA: Diagnosis not present

## 2015-09-01 ENCOUNTER — Encounter: Payer: Self-pay | Admitting: Internal Medicine

## 2015-09-02 ENCOUNTER — Ambulatory Visit (HOSPITAL_COMMUNITY)
Admission: RE | Admit: 2015-09-02 | Discharge: 2015-09-02 | Disposition: A | Discharge status: Home / Self Care | Payer: Medicare Other | Source: Ambulatory Visit | Attending: Internal Medicine | Admitting: Internal Medicine

## 2015-09-02 VITALS — BP 134/80 | HR 120 | Wt 144.0 lb

## 2015-09-02 DIAGNOSIS — I255 Ischemic cardiomyopathy: Secondary | ICD-10-CM | POA: Insufficient documentation

## 2015-09-02 DIAGNOSIS — E785 Hyperlipidemia, unspecified: Secondary | ICD-10-CM | POA: Insufficient documentation

## 2015-09-02 DIAGNOSIS — Z8249 Family history of ischemic heart disease and other diseases of the circulatory system: Secondary | ICD-10-CM | POA: Diagnosis not present

## 2015-09-02 DIAGNOSIS — M109 Gout, unspecified: Secondary | ICD-10-CM | POA: Diagnosis not present

## 2015-09-02 DIAGNOSIS — Z7902 Long term (current) use of antithrombotics/antiplatelets: Secondary | ICD-10-CM | POA: Insufficient documentation

## 2015-09-02 DIAGNOSIS — I252 Old myocardial infarction: Secondary | ICD-10-CM | POA: Diagnosis not present

## 2015-09-02 DIAGNOSIS — R000 Tachycardia, unspecified: Secondary | ICD-10-CM

## 2015-09-02 DIAGNOSIS — Z79899 Other long term (current) drug therapy: Secondary | ICD-10-CM | POA: Insufficient documentation

## 2015-09-02 DIAGNOSIS — Z7982 Long term (current) use of aspirin: Secondary | ICD-10-CM | POA: Insufficient documentation

## 2015-09-02 DIAGNOSIS — I2510 Atherosclerotic heart disease of native coronary artery without angina pectoris: Secondary | ICD-10-CM | POA: Insufficient documentation

## 2015-09-02 DIAGNOSIS — I130 Hypertensive heart and chronic kidney disease with heart failure and stage 1 through stage 4 chronic kidney disease, or unspecified chronic kidney disease: Secondary | ICD-10-CM | POA: Insufficient documentation

## 2015-09-02 DIAGNOSIS — Z955 Presence of coronary angioplasty implant and graft: Secondary | ICD-10-CM | POA: Diagnosis not present

## 2015-09-02 DIAGNOSIS — I5022 Chronic systolic (congestive) heart failure: Secondary | ICD-10-CM | POA: Diagnosis not present

## 2015-09-02 DIAGNOSIS — N183 Chronic kidney disease, stage 3 unspecified: Secondary | ICD-10-CM

## 2015-09-02 DIAGNOSIS — Z95810 Presence of automatic (implantable) cardiac defibrillator: Secondary | ICD-10-CM | POA: Diagnosis not present

## 2015-09-02 MED ORDER — ISOSORBIDE MONONITRATE ER 120 MG PO TB24: 120.0000 mg | ORAL_TABLET | Freq: Every day | ORAL | 5 refills | Status: DC

## 2015-09-02 NOTE — Patient Instructions (Signed)
Please ONLY take isosorbide MONONITRATE 120 mg daily.  STOP isosorbide DINITRATE.   Follow up in 2 weeks with Dr. Haroldine Laws.

## 2015-09-02 NOTE — Progress Notes (Signed)
Patient ID: Benn Klemann, male   DOB: [birth year], 50 y.o.   MRN: [id number]     Referring Physician: Dr Laney Pastor Primary Care: Dr Laney Pastor Primary Cardiologist: Dr. Haroldine Laws   HPI: Mr Soltero is a 50 year old male with history of CAD, ICM, MI X4 (presents with chest burning radiating to L shoulder) with 17 stents, chronic systolic HF with EF [id number] s/p St. Jude ICD, HTN, gout, and hyperlipidemia.  He just relocated to [facility] from [place]. His medical care was provided by [facility]. He has struggled with in-stent restenosis. Most recent heart cath was February 2016 with stent placed. He was referred to HF clinic by Dr Laney Pastor to establish HF care.    Admitted May 8th through May 12th, 2017 with chest pain. Had LHC as noted below no intervention.   Repeat CPX start of June with moderate functional impairment.   Admitted June 5th through June 14th, 0000000 with A/C systolic CHF. Had repeat R/LHC as below with stable CAD and low output. Started on milrinone and weaned to 0.125 mcg/kg/min for d/c. Coreg, lasix, and spiro stopped.  Entresto dropped from full dose to 24/26 mg.   He returns for HF follow up. Earlier this month milrinone was stopped but creatinine went up so it was restarted. Complaining of L flank pain on occasion. Denies SOB/PND/Orthopnea. Weight at home 138 -142 pounds.   Appetite ok.. [facility] following. Disabled since 2006.    01/2015 ECHO  EF 20-25% RV normal  R/LHC 06/23/15  Dist RCA-1 lesion, 100% stenosed. The lesion was previously treated with a stent (unknown type).  Dist RCA-2 lesion, 100% stenosed. The lesion was previously treated with a stent (unknown type).  Prox RCA lesion, 70% stenosed. The lesion was previously treated with a stent (unknown type) and angioplasty.  Mid RCA lesion, 50% stenosed. The lesion was previously treated with a stent (unknown type).  Ost RPDA to RPDA lesion, 75% stenosed. The lesion was previously treated with a drug-eluting  stent.  Mid LAD lesion, 70% stenosed.  Ost 2nd Diag lesion, 70% stenosed. Ao = 87/62 (71) LV = 81/9 RA = 2 RV = 20/0/2 PA = 24/12 (16) PCW = 4 Fick cardiac output/index = 3.1/1.8 Thermo CO/CI = 3.0/1.7 PVR = 3.9 WU Ao sat = 97% PA sat = 54%, 55%  R/LHC 05/27/15  Dist RCA-1 lesion, 100% stenosed. The lesion was previously treated with a stent (unknown type).  Dist RCA-2 lesion, 100% stenosed. The lesion was previously treated with a stent (unknown type).  Prox RCA lesion, 60% stenosed. The lesion was previously treated with a stent (unknown type) and angioplasty.  Mid RCA lesion, 40% stenosed. The lesion was previously treated with a stent (unknown type). Findings: RA = 1 RV = 22/1/5 PA = 27/13 (19) PCW = 6 Ao = 120/78 (96) LV = 124/4/13 Fick cardiac output/index = 3.3/1.9 PVR = 4.0 WU SVR = 2335  FA sat = 94% PA sat = 60%. 60%  CPX 01/15/15 FVC 2.67 (65%)    FEV1 2.18 (64%)     FEV1/FVC 81 (100%)     MVV 105 (73%) Peak VO2: 20.3 (56.3% predicted peak VO2) VE/VCO2 slope: 26.8 OUES: 1.52 Peak RER: 1.21  Labs: 12/16: K 4.0 Creatinine 1.7 Hgb 13.2 MCV 107 (denies ETOH) 05/28/2015: K 4.4 Creatinine 1.86 08/27/2015: K 4.6 Creatinine 2.6    SH: Separated from his wife. Has 4 children. 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  Father deceased but he is unsure of the cause. He had hyperlipidemia.         Sister: Heart disease  [facility]  [address] [place] [address] [phone]   Past Medical History:  Diagnosis Date  . Chronic systolic CHF (congestive heart failure) ([place])    a. 01/2015 Echo: EF 20-25%, antlat, inflat AK.  [name] CKD (chronic kidney disease), stage III   . Coronary artery disease    a. s/p MI x 4;  b. Reported h/o 17 stents with recurrent ISR;  c. 02/2014 Cath ([place]): PCI to unknown vessel;  d. 01/2015 MV: EF 18%, large scar, no ischemia; e. 03/2015 PCI: LM nl, LAD nl, D2 patent  stent, RI patent stent, LCX patent stents p/m, RCA 95p ISR (PTCA), 45m, 100d into RPL CTO, RPDA 90 (2.5x16 Synergy DES).  . Hyperlipidemia   . Hypertensive heart disease   . Ischemic cardiomyopathy    a. s/p SJM ICD 2007 w/ gen change in 2012; b. 12/2014 CPX Test: mild to mod HF limitation w/ pVO2 20.3 and nl slope;  c. 01/2015 Echo: EF 20-25%.    Current Outpatient Prescriptions  Medication Sig Dispense Refill  . Alirocumab (PRALUENT) 75 MG/ML SOPN Inject 1 pen into the skin every 14 (fourteen) days. 2 pen 11  . allopurinol (ZYLOPRIM) 100 MG tablet Take 100 mg by mouth 2 (two) times daily.     . AMBULATORY NON FORMULARY MEDICATION Inject 0.125 mcg/kg/min into the vein continuous. Medication Name: Milrinone    . amLODipine (NORVASC) 5 MG tablet Take 1 tablet (5 mg total) by mouth daily. 90 tablet 3  . aspirin 81 MG tablet Take 81 mg by mouth daily.    . cholecalciferol (VITAMIN D) 1000 UNITS tablet Take 1,000 Units by mouth daily.    . hydrALAZINE (APRESOLINE) 100 MG tablet Take 1 tablet (100 mg total) by mouth 3 (three) times daily. 90 tablet 3  . isosorbide dinitrate (ISORDIL) 20 MG tablet Take 1 tablet (20 mg total) by mouth daily. 30 tablet 3  . omega-3 acid ethyl esters (LOVAZA) 1 G capsule Take 1 g by mouth daily.     . ondansetron (ZOFRAN) 4 MG tablet Take 4 mg by mouth every 8 (eight) hours as needed for nausea or vomiting. Reported on 05/24/2015    . rosuvastatin (CRESTOR) 40 MG tablet Take 1 tablet (40 mg total) by mouth daily. 90 tablet 3  . ticagrelor (BRILINTA) 90 MG TABS tablet Take 1 tablet (90 mg total) by mouth 2 (two) times daily. 60 tablet 5  . ZETIA 10 MG tablet TAKE 1 TABLET (10 MG TOTAL) BY MOUTH DAILY. 30 tablet 2  . nitroGLYCERIN (NITROSTAT) 0.4 MG SL tablet Place 1 tablet (0.4 mg total) under the tongue every 5 (five) minutes as needed for chest pain. Reported on 04/12/2015 (Patient not taking: Reported on 09/02/2015) 60 tablet 1   No current facility-administered  medications for this encounter.     No Known Allergies    Social History   Social History  . Marital status: Legally Separated    Spouse name: N/A  . Number of children: N/A  . Years of education: N/A   Occupational History  . Not on file.   Social History Main Topics  . Smoking status: Never Smoker  . Smokeless tobacco: Not on file  . Alcohol use No  . Drug use: No  . Sexual activity: No   Other Topics Concern  . Not on file  Social History Narrative   Moved to [place] from [place] in 2016.  Lives alone in [place].      Family History  Problem Relation Age of Onset  . Cancer Mother     died when pt was 14.  She had a h/o heart dzs as well.  . Hypertension Mother   . Hyperlipidemia Father     Father died when pt was age 61 - he believes that he had a cardiac hx.  [name] CAD Sister     Vitals:   09/02/15 1228  BP: 134/80  Pulse: (!) 120  SpO2: 100%  Weight: 144 lb (65.3 kg)   Wt Readings from Last 3 Encounters:  09/02/15 144 lb (65.3 kg)  08/24/15 143 lb 12 oz (65.2 kg)  08/17/15 145 lb 9.6 oz (66 kg)     PHYSICAL EXAM: General: Well appearing. Ambulated in the clinic without difficulty. HEENT: normal Neck: supple. Carotids 2+ bilat; no bruits. No thyromegaly or nodule noted. JVP flat  Cor: PMI laterally displaced. RRR. No M/G/R apprecaited Lungs: CTAB, normal effort Abdomen: soft, NT, ND, no HSM. No bruits or masses. +BS  Extremities: no cyanosis, clubbing, rash, edema RUE PICC Neuro: alert & oriented x 3, cranial nerves grossly intact. moves all 4 extremities w/o difficulty. Affect pleasant. RUE PICC  EKG: Sinus Tach 120 bpm   ASSESSMENT & PLAN:  1. Chronic Systolic Heart Failure - due to iCM. EF 20-25% on echo 1/17. RV ok. Has ST Jude ICD  -Repeat CPX test with moderate functional limitation due to HF.   Functional decline.  NYHA IIIb. Inotrope dependent. Milrinone 0.25 mcg.  Volume status stable.  Off diuretics. He remains tachycardic.May need to consider  corlanor. Will continue to hold BB with low output for now.  Off ARNI/spiro/dig with elevated creatinine.  -- Continue hydralazine 100 mg three times a day and imdur 120 mg daily. Today we have instructed him to stop isordil.   2. CAD- MI X4 17 stents  Most recent [place] 06/2015 - no intervention continued medical management.    Dist RCA-1 lesion, 100% stenosed. The lesion was previously treated with a stent (unknown type).  Dist RCA-2 lesion, 100% stenosed. The lesion was previously treated with a stent (unknown type).  Prox RCA lesion, 70% stenosed. The lesion was previously treated with a stent (unknown type) and angioplasty.  Mid RCA lesion, 50% stenosed. The lesion was previously treated with a stent (unknown type).  Ost RPDA to RPDA lesion, 75% stenosed. The lesion was previously treated with a drug-eluting stent.  Mid LAD lesion, 70% stenosed.  Ost 2nd Diag lesion, 70% stenosed. - Continue ASA, statin, ticagrelor. Off BB with low output.  3. HTN - Remains elevated. - Med changes as above.  4. Hyperlipidemia - Continue crestor 40 mg daily. 5. CKD stage III - Follow renal function. Creatinine coming down from 2.8>2.6   Follow up  In 2 weeks with Dr Haroldine Laws.    Kadi Hession NP-C  12:34 PM

## 2015-09-02 NOTE — Progress Notes (Signed)
Advanced Heart Failure Medication Review by a Pharmacist  Does the patient  feel that his/her medications are working for him/her?  yes  Has the patient been experiencing any side effects to the medications prescribed?  no  Does the patient measure his/her own blood pressure or blood glucose at home?  no   Does the patient have any problems obtaining medications due to transportation or finances?   no  Understanding of regimen: good Understanding of indications: good Potential of compliance: good Patient understands to avoid NSAIDs. Patient understands to avoid decongestants.  Issues to address at subsequent visits: None   Pharmacist comments:  Mr. Pretorius is a pleasant 50 yo M presenting without a medication list but with good recall of his regimen. He reports good compliance with his regimen and did not have any specific medication-related questions or concerns for me at this time.   Ruta Hinds. Velva Harman, PharmD, BCPS, CPP Clinical Pharmacist Pager: [phone] Phone: [phone] 09/02/2015 12:33 PM      Time with patient: 10 minutes Preparation and documentation time: 2 minutes Total time: 12 minutes

## 2015-09-03 ENCOUNTER — Other Ambulatory Visit (HOSPITAL_COMMUNITY): Payer: Self-pay | Admitting: Internal Medicine

## 2015-09-07 DIAGNOSIS — I5023 Acute on chronic systolic (congestive) heart failure: Secondary | ICD-10-CM | POA: Diagnosis not present

## 2015-09-07 DIAGNOSIS — I25110 Atherosclerotic heart disease of native coronary artery with unstable angina pectoris: Secondary | ICD-10-CM | POA: Diagnosis not present

## 2015-09-07 DIAGNOSIS — I160 Hypertensive urgency: Secondary | ICD-10-CM | POA: Diagnosis not present

## 2015-09-07 DIAGNOSIS — N183 Chronic kidney disease, stage 3 (moderate): Secondary | ICD-10-CM | POA: Diagnosis not present

## 2015-09-07 DIAGNOSIS — I110 Hypertensive heart disease with heart failure: Secondary | ICD-10-CM | POA: Diagnosis not present

## 2015-09-07 DIAGNOSIS — I255 Ischemic cardiomyopathy: Secondary | ICD-10-CM | POA: Diagnosis not present

## 2015-09-07 DIAGNOSIS — I130 Hypertensive heart and chronic kidney disease with heart failure and stage 1 through stage 4 chronic kidney disease, or unspecified chronic kidney disease: Secondary | ICD-10-CM | POA: Diagnosis not present

## 2015-09-14 DIAGNOSIS — I255 Ischemic cardiomyopathy: Secondary | ICD-10-CM | POA: Diagnosis not present

## 2015-09-14 DIAGNOSIS — I130 Hypertensive heart and chronic kidney disease with heart failure and stage 1 through stage 4 chronic kidney disease, or unspecified chronic kidney disease: Secondary | ICD-10-CM | POA: Diagnosis not present

## 2015-09-14 DIAGNOSIS — I25110 Atherosclerotic heart disease of native coronary artery with unstable angina pectoris: Secondary | ICD-10-CM | POA: Diagnosis not present

## 2015-09-14 DIAGNOSIS — N183 Chronic kidney disease, stage 3 (moderate): Secondary | ICD-10-CM | POA: Diagnosis not present

## 2015-09-14 DIAGNOSIS — I160 Hypertensive urgency: Secondary | ICD-10-CM | POA: Diagnosis not present

## 2015-09-14 DIAGNOSIS — I5023 Acute on chronic systolic (congestive) heart failure: Secondary | ICD-10-CM | POA: Diagnosis not present

## 2015-09-16 ENCOUNTER — Ambulatory Visit (HOSPITAL_COMMUNITY)
Admission: RE | Admit: 2015-09-16 | Discharge: 2015-09-16 | Disposition: A | Discharge status: Home / Self Care | Payer: Medicare Other | Source: Ambulatory Visit | Attending: Internal Medicine | Admitting: Internal Medicine

## 2015-09-16 VITALS — BP 136/78 | HR 110 | Wt 146.2 lb

## 2015-09-16 DIAGNOSIS — Z79899 Other long term (current) drug therapy: Secondary | ICD-10-CM | POA: Insufficient documentation

## 2015-09-16 DIAGNOSIS — M109 Gout, unspecified: Secondary | ICD-10-CM | POA: Insufficient documentation

## 2015-09-16 DIAGNOSIS — I2510 Atherosclerotic heart disease of native coronary artery without angina pectoris: Secondary | ICD-10-CM | POA: Diagnosis not present

## 2015-09-16 DIAGNOSIS — N183 Chronic kidney disease, stage 3 unspecified: Secondary | ICD-10-CM

## 2015-09-16 DIAGNOSIS — Z95810 Presence of automatic (implantable) cardiac defibrillator: Secondary | ICD-10-CM | POA: Diagnosis not present

## 2015-09-16 DIAGNOSIS — I5022 Chronic systolic (congestive) heart failure: Secondary | ICD-10-CM | POA: Diagnosis not present

## 2015-09-16 DIAGNOSIS — I130 Hypertensive heart and chronic kidney disease with heart failure and stage 1 through stage 4 chronic kidney disease, or unspecified chronic kidney disease: Secondary | ICD-10-CM | POA: Diagnosis not present

## 2015-09-16 DIAGNOSIS — I252 Old myocardial infarction: Secondary | ICD-10-CM | POA: Diagnosis not present

## 2015-09-16 DIAGNOSIS — E785 Hyperlipidemia, unspecified: Secondary | ICD-10-CM | POA: Diagnosis not present

## 2015-09-16 DIAGNOSIS — Z7902 Long term (current) use of antithrombotics/antiplatelets: Secondary | ICD-10-CM | POA: Diagnosis not present

## 2015-09-16 DIAGNOSIS — Z955 Presence of coronary angioplasty implant and graft: Secondary | ICD-10-CM | POA: Insufficient documentation

## 2015-09-16 DIAGNOSIS — Z7982 Long term (current) use of aspirin: Secondary | ICD-10-CM | POA: Insufficient documentation

## 2015-09-16 DIAGNOSIS — R000 Tachycardia, unspecified: Secondary | ICD-10-CM

## 2015-09-16 DIAGNOSIS — Z8249 Family history of ischemic heart disease and other diseases of the circulatory system: Secondary | ICD-10-CM | POA: Insufficient documentation

## 2015-09-16 LAB — BASIC METABOLIC PANEL
ANION GAP: 8 (ref 5–15)
BUN: 29 mg/dL — ABNORMAL HIGH (ref 6–20)
CALCIUM: 9.4 mg/dL (ref 8.9–10.3)
CHLORIDE: 107 mmol/L (ref 101–111)
CO2: 23 mmol/L (ref 22–32)
CREATININE: 2.16 mg/dL — AB (ref 0.61–1.24)
GFR calc non Af Amer: 34 mL/min — ABNORMAL LOW (ref >60)
GFR, EST AFRICAN AMERICAN: 39 mL/min — AB (ref >60)
GLUCOSE: 114 mg/dL — AB (ref 65–99)
Potassium: 4.3 mmol/L (ref 3.5–5.1)
Sodium: 138 mmol/L (ref 135–145)

## 2015-09-16 LAB — CBC
HCT: 35.9 % — ABNORMAL LOW (ref 39.0–52.0)
HEMOGLOBIN: 12.1 g/dL — AB (ref 13.0–17.0)
MCH: 34.3 pg — AB (ref 26.0–34.0)
MCHC: 33.7 g/dL (ref 30.0–36.0)
MCV: 101.7 fL — AB (ref 78.0–100.0)
Platelets: 239 10*3/uL (ref 150–400)
RBC: 3.53 MIL/uL — AB (ref 4.22–5.81)
RDW: 12.9 % (ref 11.5–15.5)
WBC: 4.9 10*3/uL (ref 4.0–10.5)

## 2015-09-16 LAB — CARBOXYHEMOGLOBIN
Carboxyhemoglobin: 1 % (ref 0.5–1.5)
METHEMOGLOBIN: 0.8 % (ref 0.0–1.5)
O2 Saturation: 63.1 %
Total hemoglobin: 12.2 g/dL (ref 12.0–16.0)

## 2015-09-16 MED ORDER — IVABRADINE HCL 5 MG PO TABS: 2.5000 mg | ORAL_TABLET | Freq: Two times a day (BID) | ORAL | 3 refills | Status: DC

## 2015-09-16 NOTE — Patient Instructions (Signed)
Start Corlanor 2.5 mg (1/2 tab) Twice daily   Your physician recommends that you schedule a follow-up appointment in: 4 weeks

## 2015-09-16 NOTE — Progress Notes (Signed)
Patient ID: Marvin Martin, male   DOB: 1965/01/31, 50 y.o.   MRN: [id number]     Referring Physician: Dr Laney Pastor Primary Care: Dr Laney Pastor Primary Cardiologist: Dr. Haroldine Laws   HPI: Marvin Martin is a 50 year old male with history of CAD, ICM, MI X4 (presents with chest burning radiating to L shoulder) with 17 stents, chronic systolic HF with EF [id number] s/p St. Jude ICD, HTN, gout, and hyperlipidemia.  He just relocated to [facility] from [place]. His medical care was provided by [facility]. He has struggled with in-stent restenosis. Most recent heart cath was February 2016 with stent placed. He was referred to HF clinic by Dr Laney Pastor to establish HF care.    Admitted May 8th through May 12th, 2017 with chest pain. Had LHC as noted below no intervention.   Admitted June 5th through June 14th, 0000000 with A/C systolic CHF. Had repeat R/LHC as below with stable CAD and low output. Started on milrinone and weaned to 0.125 mcg/kg/min for d/c. Coreg, lasix, and spiro stopped.  Entresto dropped from full dose to 24/26 mg.   He returns for HF follow up. Overall feeling good. Denies SOB/PND/Orthopnea/CP. Able to walk 1 1/2 blocks. Has leg fatigue after he walks. Weight at home 137-138 pounds. He continues on milrinone 0.25 mcg. Appetite ok.. Drinking 1/2 gallon of water per day. Taking all medications. [facility] following. Disabled since 2006. Lives alone.    01/2015 ECHO  EF 20-25% RV normal   R/LHC 06/23/15  Dist RCA-1 lesion, 100% stenosed. The lesion was previously treated with a stent (unknown type).  Dist RCA-2 lesion, 100% stenosed. The lesion was previously treated with a stent (unknown type).  Prox RCA lesion, 70% stenosed. The lesion was previously treated with a stent (unknown type) and angioplasty.  Mid RCA lesion, 50% stenosed. The lesion was previously treated with a stent (unknown type).  Ost RPDA to RPDA lesion, 75% stenosed. The lesion was previously treated with a drug-eluting  stent.  Mid LAD lesion, 70% stenosed.  Ost 2nd Diag lesion, 70% stenosed. Ao = 87/62 (71) LV = 81/9 RA = 2 RV = 20/0/2 PA = 24/12 (16) PCW = 4 Fick cardiac output/index = 3.1/1.8 Thermo CO/CI = 3.0/1.7 PVR = 3.9 WU Ao sat = 97% PA sat = 54%, 55%  R/LHC 05/27/15  Dist RCA-1 lesion, 100% stenosed. The lesion was previously treated with a stent (unknown type).  Dist RCA-2 lesion, 100% stenosed. The lesion was previously treated with a stent (unknown type).  Prox RCA lesion, 60% stenosed. The lesion was previously treated with a stent (unknown type) and angioplasty.  Mid RCA lesion, 40% stenosed. The lesion was previously treated with a stent (unknown type). Findings: RA = 1 RV = 22/1/5 PA = 27/13 (19) PCW = 6 Ao = 120/78 (96) LV = 124/4/13 Fick cardiac output/index = 3.3/1.9 PVR = 4.0 WU SVR = 2335  FA sat = 94% PA sat = 60%. 60%  CPX 01/15/15 FVC 2.67 (65%)    FEV1 2.18 (64%)     FEV1/FVC 81 (100%)     MVV 105 (73%) Peak VO2: 20.3 (56.3% predicted peak VO2) VE/VCO2 slope: 26.8 OUES: 1.52 Peak RER: 1.21  CPX 06/2015  Peak VO2: 19.9 (55.8% predicted peak VO2) VE/VCO2 slope: 31.6 OUES: 1.42 Peak RER: 1.26  Labs: 12/16: K 4.0 Creatinine 1.7 Hgb 13.2 MCV 107 (denies ETOH) 05/28/2015: K 4.4 Creatinine 1.86 08/27/2015: K 4.6 Creatinine 2.6  09/07/2015: K 5.0 Creatinine 2.35  SH: Separated from his wife. Has 4 children. 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         Father deceased but he is unsure of the cause. He had hyperlipidemia.         Sister: Heart disease  [facility]  [address] [place] [address] [phone]   Past Medical History:  Diagnosis Date  . Chronic systolic CHF (congestive heart failure) ([place])    a. 01/2015 Echo: EF 20-25%, antlat, inflat AK.  [name] CKD (chronic kidney disease), stage III   . Coronary artery disease    a. s/p MI x 4;  b. Reported h/o 17 stents with  recurrent ISR;  c. 02/2014 Cath ([place]): PCI to unknown vessel;  d. 01/2015 MV: EF 18%, large scar, no ischemia; e. 03/2015 PCI: LM nl, LAD nl, D2 patent stent, RI patent stent, LCX patent stents p/m, RCA 95p ISR (PTCA), 61m, 100d into RPL CTO, RPDA 90 (2.5x16 Synergy DES).  . Hyperlipidemia   . Hypertensive heart disease   . Ischemic cardiomyopathy    a. s/p SJM ICD 2007 w/ gen change in 2012; b. 12/2014 CPX Test: mild to mod HF limitation w/ pVO2 20.3 and nl slope;  c. 01/2015 Echo: EF 20-25%.    Current Outpatient Prescriptions  Medication Sig Dispense Refill  . Alirocumab (PRALUENT) 75 MG/ML SOPN Inject 1 pen into the skin every 14 (fourteen) days. 2 pen 11  . allopurinol (ZYLOPRIM) 100 MG tablet Take 100 mg by mouth 2 (two) times daily.     . AMBULATORY NON FORMULARY MEDICATION Inject 0.125 mcg/kg/min into the vein continuous. Medication Name: Milrinone    . amLODipine (NORVASC) 5 MG tablet Take 1 tablet (5 mg total) by mouth daily. 90 tablet 3  . aspirin 81 MG tablet Take 81 mg by mouth daily.    . cholecalciferol (VITAMIN D) 1000 UNITS tablet Take 1,000 Units by mouth daily.    . hydrALAZINE (APRESOLINE) 100 MG tablet Take 1 tablet (100 mg total) by mouth 3 (three) times daily. 90 tablet 3  . isosorbide mononitrate (IMDUR) 120 MG 24 hr tablet Take 1 tablet (120 mg total) by mouth daily. 30 tablet 5  . nitroGLYCERIN (NITROSTAT) 0.4 MG SL tablet Place 1 tablet (0.4 mg total) under the tongue every 5 (five) minutes as needed for chest pain. Reported on 04/12/2015 60 tablet 1  . omega-3 acid ethyl esters (LOVAZA) 1 G capsule Take 1 g by mouth daily.     . rosuvastatin (CRESTOR) 40 MG tablet Take 1 tablet (40 mg total) by mouth daily. 90 tablet 3  . ticagrelor (BRILINTA) 90 MG TABS tablet Take 1 tablet (90 mg total) by mouth 2 (two) times daily. 60 tablet 5  . ZETIA 10 MG tablet TAKE 1 TABLET (10 MG TOTAL) BY MOUTH DAILY. 30 tablet 2  . ondansetron (ZOFRAN) 4 MG tablet Take 4 mg by mouth every 8  (eight) hours as needed for nausea or vomiting. Reported on 05/24/2015     No current facility-administered medications for this encounter.     No Known Allergies    Social History   Social History  . Marital status: Legally Separated    Spouse name: N/A  . Number of children: N/A  . Years of education: N/A   Occupational History  . Not on file.   Social History Main Topics  . Smoking status: Never Smoker  . Smokeless tobacco: Not on file  .  Alcohol use No  . Drug use: No  . Sexual activity: No   Other Topics Concern  . Not on file   Social History Narrative   Moved to [place] from [place] in 2016.  Lives alone in [place].      Family History  Problem Relation Age of Onset  . Cancer Mother     died when pt was 89.  She had a h/o heart dzs as well.  . Hypertension Mother   . Hyperlipidemia Father     Father died when pt was age 50 - he believes that he had a cardiac hx.  [name] CAD Sister     Vitals:   09/16/15 1514  BP: 136/78  Pulse: (!) 110  SpO2: 96%  Weight: 146 lb 4 oz (66.3 kg)   Wt Readings from Last 3 Encounters:  09/16/15 146 lb 4 oz (66.3 kg)  09/02/15 144 lb (65.3 kg)  08/24/15 143 lb 12 oz (65.2 kg)     PHYSICAL EXAM: General: Well appearing. Ambulated in the clinic without difficulty. HEENT: normal Neck: supple. Carotids 2+ bilat; no bruits. No thyromegaly or nodule noted. JVP 7-8  Cor: PMI laterallydisplaced. Tqchy regular. +s3. Lungs: CTAB, normal effort Abdomen: soft, NT, ND, no HSM. No bruits or masses. +BS  Extremities: no cyanosis, clubbing, rash, edema RUE PICC Neuro: alert & oriented x 3, cranial nerves grossly intact. moves all 4 extremities w/o difficulty. Affect pleasant. RUE PICC   ASSESSMENT & PLAN:  1. Chronic Systolic Heart Failure - due to iCM. EF 20-25% on echo 1/17. RV ok. Has ST Jude ICD  -Repeat CPX test with moderate functional limitation due to HF.    NYHA IIIb. Inotrope dependent. Milrinone 0.25 mcg.  ICD Interrogated in Clinic.  No VT/AF. Corvue- Elevated fluid index for the last 12 days but on exam does not appear elevated.  Volume status stable.  Off diuretics. He remains tachycardic.  Add 2.5 mg corlanor twice dail.   Will continue to hold BB with low output for now.  Off ARNI/spiro/dig with elevated creatinine.  -- Continue hydralazine 100 mg three times a day and imdur 120 mg daily.  2. CAD- MI X4 17 stents  Most recent [place] 06/2015 - no intervention continued medical management.    Dist RCA-1 lesion, 100% stenosed. The lesion was previously treated with a stent (unknown type).  Dist RCA-2 lesion, 100% stenosed. The lesion was previously treated with a stent (unknown type).  Prox RCA lesion, 70% stenosed. The lesion was previously treated with a stent (unknown type) and angioplasty.  Mid RCA lesion, 50% stenosed. The lesion was previously treated with a stent (unknown type).  Ost RPDA to RPDA lesion, 75% stenosed. The lesion was previously treated with a drug-eluting stent.  Mid LAD lesion, 70% stenosed.  Ost 2nd Diag lesion, 70% stenosed. - Continue ASA, statin, ticagrelor. Off BB with low output.  3. HTN - Improved.   4. Hyperlipidemia - Continue crestor 40 mg daily. 5. CKD stage III - Follow renal function. Creatinine coming down from 2.8>2.6 >2.35  Follow up in 4 weeks with Dr Haroldine Laws.    Darrick Grinder NP-C  3:23 PM   Patient seen and examined with Darrick Grinder, NP. We discussed all aspects of the encounter. I agree with the assessment and plan as stated above.   He has failed milrinone wean. Now NYHA II-III on milrinone. BP improved. HR still very fast. Volume status minimally elevated on exam and ICD interrogation. Renal function improving.Reinforced  need for daily weights and reviewed use of sliding scale diuretics. Start low-dose ivabradine. CAD stable.   Bensimhon, Daniel,MD 11:39 PM

## 2015-09-17 ENCOUNTER — Other Ambulatory Visit (HOSPITAL_COMMUNITY): Payer: Self-pay | Admitting: Internal Medicine

## 2015-09-21 ENCOUNTER — Telehealth (HOSPITAL_COMMUNITY): Payer: Self-pay

## 2015-09-21 DIAGNOSIS — I160 Hypertensive urgency: Secondary | ICD-10-CM | POA: Diagnosis not present

## 2015-09-21 DIAGNOSIS — N183 Chronic kidney disease, stage 3 (moderate): Secondary | ICD-10-CM | POA: Diagnosis not present

## 2015-09-21 DIAGNOSIS — I5023 Acute on chronic systolic (congestive) heart failure: Secondary | ICD-10-CM | POA: Diagnosis not present

## 2015-09-21 DIAGNOSIS — I25110 Atherosclerotic heart disease of native coronary artery with unstable angina pectoris: Secondary | ICD-10-CM | POA: Diagnosis not present

## 2015-09-21 DIAGNOSIS — I255 Ischemic cardiomyopathy: Secondary | ICD-10-CM | POA: Diagnosis not present

## 2015-09-21 DIAGNOSIS — I130 Hypertensive heart and chronic kidney disease with heart failure and stage 1 through stage 4 chronic kidney disease, or unspecified chronic kidney disease: Secondary | ICD-10-CM | POA: Diagnosis not present

## 2015-09-21 NOTE — Telephone Encounter (Signed)
Baker Janus RN [place] called to report during routine visit to refill milrinone bag BP was noted to be 154/108, however, patient had not taken any morning medications yet. RN asked patient to recheck BP at lunch time with his home BP monitor and will follow up at that time. No changes/orders per CHF providers at this time, continue to monitor.  Renee Pain, RN

## 2015-09-23 ENCOUNTER — Telehealth (HOSPITAL_COMMUNITY): Payer: Self-pay | Admitting: Pharmacist

## 2015-09-23 NOTE — Telephone Encounter (Signed)
Corlanor 5 mg PA approved by [employer] Part D through 09/21/17.   Ruta Hinds. Velva Harman, PharmD, BCPS, CPP Clinical Pharmacist Pager: [phone] Phone: [phone] 09/23/2015 2:35 PM

## 2015-09-28 DIAGNOSIS — N183 Chronic kidney disease, stage 3 (moderate): Secondary | ICD-10-CM | POA: Diagnosis not present

## 2015-09-28 DIAGNOSIS — I25110 Atherosclerotic heart disease of native coronary artery with unstable angina pectoris: Secondary | ICD-10-CM | POA: Diagnosis not present

## 2015-09-28 DIAGNOSIS — I255 Ischemic cardiomyopathy: Secondary | ICD-10-CM | POA: Diagnosis not present

## 2015-09-28 DIAGNOSIS — I130 Hypertensive heart and chronic kidney disease with heart failure and stage 1 through stage 4 chronic kidney disease, or unspecified chronic kidney disease: Secondary | ICD-10-CM | POA: Diagnosis not present

## 2015-09-28 DIAGNOSIS — I5023 Acute on chronic systolic (congestive) heart failure: Secondary | ICD-10-CM | POA: Diagnosis not present

## 2015-09-28 DIAGNOSIS — I160 Hypertensive urgency: Secondary | ICD-10-CM | POA: Diagnosis not present

## 2015-09-29 ENCOUNTER — Encounter: Payer: Self-pay | Admitting: Internal Medicine

## 2015-10-02 ENCOUNTER — Other Ambulatory Visit (HOSPITAL_COMMUNITY): Payer: Self-pay | Admitting: Internal Medicine

## 2015-10-04 ENCOUNTER — Other Ambulatory Visit (HOSPITAL_COMMUNITY): Payer: Self-pay | Admitting: Internal Medicine

## 2015-10-05 DIAGNOSIS — I160 Hypertensive urgency: Secondary | ICD-10-CM | POA: Diagnosis not present

## 2015-10-05 DIAGNOSIS — I25110 Atherosclerotic heart disease of native coronary artery with unstable angina pectoris: Secondary | ICD-10-CM | POA: Diagnosis not present

## 2015-10-05 DIAGNOSIS — I255 Ischemic cardiomyopathy: Secondary | ICD-10-CM | POA: Diagnosis not present

## 2015-10-05 DIAGNOSIS — I5023 Acute on chronic systolic (congestive) heart failure: Secondary | ICD-10-CM | POA: Diagnosis not present

## 2015-10-05 DIAGNOSIS — I130 Hypertensive heart and chronic kidney disease with heart failure and stage 1 through stage 4 chronic kidney disease, or unspecified chronic kidney disease: Secondary | ICD-10-CM | POA: Diagnosis not present

## 2015-10-05 DIAGNOSIS — N183 Chronic kidney disease, stage 3 (moderate): Secondary | ICD-10-CM | POA: Diagnosis not present

## 2015-10-06 ENCOUNTER — Encounter: Payer: Self-pay | Admitting: Internal Medicine

## 2015-10-12 ENCOUNTER — Telehealth (HOSPITAL_COMMUNITY): Payer: Self-pay | Admitting: Cardiology

## 2015-10-12 DIAGNOSIS — I25110 Atherosclerotic heart disease of native coronary artery with unstable angina pectoris: Secondary | ICD-10-CM | POA: Diagnosis not present

## 2015-10-12 DIAGNOSIS — I255 Ischemic cardiomyopathy: Secondary | ICD-10-CM | POA: Diagnosis not present

## 2015-10-12 DIAGNOSIS — I130 Hypertensive heart and chronic kidney disease with heart failure and stage 1 through stage 4 chronic kidney disease, or unspecified chronic kidney disease: Secondary | ICD-10-CM | POA: Diagnosis not present

## 2015-10-12 DIAGNOSIS — I160 Hypertensive urgency: Secondary | ICD-10-CM | POA: Diagnosis not present

## 2015-10-12 DIAGNOSIS — N183 Chronic kidney disease, stage 3 (moderate): Secondary | ICD-10-CM | POA: Diagnosis not present

## 2015-10-12 DIAGNOSIS — I5023 Acute on chronic systolic (congestive) heart failure: Secondary | ICD-10-CM | POA: Diagnosis not present

## 2015-10-12 MED ORDER — MAGNESIUM OXIDE -MG SUPPLEMENT 200 MG PO TABS: 200.0000 mg | ORAL_TABLET | Freq: Two times a day (BID) | ORAL | 3 refills | Status: DC

## 2015-10-12 NOTE — Telephone Encounter (Signed)
ABNORMAL LABS Labs drawn 10/06/15 Mg- 1.6  Per vo Rebecca Eaton Start Mag 200mg  one tab twice a day And recheck next week   Patient aware and voiced understanding rx sent to pharmacy and [facility] to recheck labs x 1 week

## 2015-10-13 ENCOUNTER — Ambulatory Visit (HOSPITAL_COMMUNITY)
Admission: RE | Admit: 2015-10-13 | Discharge: 2015-10-13 | Disposition: A | Discharge status: Home / Self Care | Payer: Medicare Other | Source: Ambulatory Visit | Attending: Cardiology | Admitting: Cardiology

## 2015-10-13 ENCOUNTER — Encounter: Payer: Self-pay | Admitting: Internal Medicine

## 2015-10-13 VITALS — BP 176/120 | HR 110 | Wt 149.0 lb

## 2015-10-13 DIAGNOSIS — I5022 Chronic systolic (congestive) heart failure: Secondary | ICD-10-CM | POA: Diagnosis not present

## 2015-10-13 DIAGNOSIS — I255 Ischemic cardiomyopathy: Secondary | ICD-10-CM | POA: Insufficient documentation

## 2015-10-13 DIAGNOSIS — I130 Hypertensive heart and chronic kidney disease with heart failure and stage 1 through stage 4 chronic kidney disease, or unspecified chronic kidney disease: Secondary | ICD-10-CM | POA: Insufficient documentation

## 2015-10-13 DIAGNOSIS — Z7982 Long term (current) use of aspirin: Secondary | ICD-10-CM | POA: Diagnosis not present

## 2015-10-13 DIAGNOSIS — M109 Gout, unspecified: Secondary | ICD-10-CM | POA: Diagnosis not present

## 2015-10-13 DIAGNOSIS — I252 Old myocardial infarction: Secondary | ICD-10-CM | POA: Diagnosis not present

## 2015-10-13 DIAGNOSIS — I2510 Atherosclerotic heart disease of native coronary artery without angina pectoris: Secondary | ICD-10-CM

## 2015-10-13 DIAGNOSIS — Z95810 Presence of automatic (implantable) cardiac defibrillator: Secondary | ICD-10-CM

## 2015-10-13 DIAGNOSIS — I10 Essential (primary) hypertension: Secondary | ICD-10-CM

## 2015-10-13 DIAGNOSIS — N183 Chronic kidney disease, stage 3 unspecified: Secondary | ICD-10-CM

## 2015-10-13 DIAGNOSIS — E785 Hyperlipidemia, unspecified: Secondary | ICD-10-CM | POA: Diagnosis not present

## 2015-10-13 MED ORDER — AMLODIPINE BESYLATE 10 MG PO TABS: 10.0000 mg | ORAL_TABLET | Freq: Every day | ORAL | 3 refills | Status: DC

## 2015-10-13 NOTE — Patient Instructions (Addendum)
INCREASE Amlodipine to 10 mg once daily. You can "double up" on your 5 mg tablets, new Rx will be a 10 mg tablet (Take one of these once daily).  Take one 20 mg tablet of lasix once today and once tomorrow.  Take 1/2 potassium tablet once today and once tomorrow.  Follow up 1 week with Dr. Haroldine Laws and labs (BMET).  Do the following things EVERYDAY: 1) Weigh yourself in the morning before breakfast. Write it down and keep it in a log. 2) Take your medicines as prescribed 3) Eat low salt foods-Limit salt (sodium) to 2000 mg per day.  4) Stay as active as you can everyday 5) Limit all fluids for the day to less than 2 liters

## 2015-10-13 NOTE — Progress Notes (Signed)
CSW met with patient who is known to CSW from previous visits. Patient in good spirits and reports he is doing well at home. Although, mentioned he is struggling with ambulating distances particularly mentioned getting to his mailbox as he gets SOB and weak in the legs. Patient continues on home inotropes and states they are helping. He has [facility] for care of the line and medication. CSW provided supportive intervention and will continue to follow for further goals of care discussions. CSW will meet with patient on next clinic visit. Raquel Sarna, LCSW [phone]

## 2015-10-13 NOTE — Addendum Note (Signed)
Encounter addended by: Louann Liv, LCSW on: 10/13/2015  2:18 PM<BR>    Actions taken: Sign clinical note

## 2015-10-13 NOTE — Progress Notes (Signed)
Patient ID: Marvin Martin, male   DOB: Jun 26, 1965, 50 y.o.   MRN: [id number]     Referring Physician: Dr Laney Pastor Primary Care: Dr Laney Pastor Primary Cardiologist: Dr. Haroldine Laws   HPI: Marvin Martin is a 50 year old male with history of CAD, ICM, MI X4 (presents with chest burning radiating to L shoulder) with 17 stents, chronic systolic HF with EF 69% s/p St. Jude ICD, HTN, gout, and hyperlipidemia.  He just relocated to [facility] from [place]. His medical care was provided by [facility]. He has struggled with in-stent restenosis. Most recent heart cath was February 2016 with stent placed. He was referred to HF clinic by Dr Laney Pastor to establish HF care.    Admitted May 8th through May 12th, 2017 with chest pain. Had LHC as noted below no intervention.   Admitted June 5th through June 14th, 4503 with A/C systolic CHF. Had repeat R/LHC as below with stable CAD and low output. Started on milrinone and weaned to 0.125 mcg/kg/min for d/c. Coreg, lasix, and spiro stopped.  Entresto dropped from full dose to 24/26 mg.   He returns for HF follow up. Overall feeling ok. Says he tried to walk 6 blocks to get his mail but he had stop and get assistance to get back home. He can safely walk 1/2 block. Denies SOB/PND/Orthopnea/CP. Has leg fatigue after he walks. Weight at home 139-140 pounds. He continues on milrinone 0.25 mcg. Appetite ok.. Taking all medications. [facility] following. Disabled since 2006. Lives alone. Requires assistance with transportation.    01/2015 ECHO  EF 20-25% RV normal   R/LHC 06/23/15  Dist RCA-1 lesion, 100% stenosed. The lesion was previously treated with a stent (unknown type).  Dist RCA-2 lesion, 100% stenosed. The lesion was previously treated with a stent (unknown type).  Prox RCA lesion, 70% stenosed. The lesion was previously treated with a stent (unknown type) and angioplasty.  Mid RCA lesion, 50% stenosed. The lesion was previously treated with a stent (unknown  type).  Ost RPDA to RPDA lesion, 75% stenosed. The lesion was previously treated with a drug-eluting stent.  Mid LAD lesion, 70% stenosed.  Ost 2nd Diag lesion, 70% stenosed. Ao = 87/62 (71) LV = 81/9 RA = 2 RV = 20/0/2 PA = 24/12 (16) PCW = 4 Fick cardiac output/index = 3.1/1.8 Thermo CO/CI = 3.0/1.7 PVR = 3.9 WU Ao sat = 97% PA sat = 54%, 55%  R/LHC 05/27/15  Dist RCA-1 lesion, 100% stenosed. The lesion was previously treated with a stent (unknown type).  Dist RCA-2 lesion, 100% stenosed. The lesion was previously treated with a stent (unknown type).  Prox RCA lesion, 60% stenosed. The lesion was previously treated with a stent (unknown type) and angioplasty.  Mid RCA lesion, 40% stenosed. The lesion was previously treated with a stent (unknown type). Findings: RA = 1 RV = 22/1/5 PA = 27/13 (19) PCW = 6 Ao = 120/78 (96) LV = 124/4/13 Fick cardiac output/index = 3.3/1.9 PVR = 4.0 WU SVR = 2335  FA sat = 94% PA sat = 60%. 60%  CPX 01/15/15 FVC 2.67 (65%)    FEV1 2.18 (64%)     FEV1/FVC 81 (100%)     MVV 105 (73%) Peak VO2: 20.3 (56.3% predicted peak VO2) VE/VCO2 slope: 26.8 OUES: 1.52 Peak RER: 1.21  CPX 06/2015  Peak VO2: 19.9 (55.8% predicted peak VO2) VE/VCO2 slope: 31.6 OUES: 1.42 Peak RER: 1.26  Labs: 12/16: K 4.0 Creatinine 1.7 Hgb 13.2 MCV 107 (denies  ETOH) 05/28/2015: K 4.4 Creatinine 1.86 08/27/2015: K 4.6 Creatinine 2.6  09/07/2015: K 5.0 Creatinine 2.35 09/16/2015: K 4.3 Creatinine 2.16  09/21/2015: K 4.4 Creatinine 1.71    SH: Separated from his wife. Has 4 children. 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         Father deceased but he is unsure of the cause. He had hyperlipidemia.         Sister: Heart disease  [facility]  [address] [place] [address] [phone]   Past Medical History:  Diagnosis Date  . Chronic systolic CHF (congestive heart failure) ([place])    a.  01/2015 Echo: EF 20-25%, antlat, inflat AK.  [name] CKD (chronic kidney disease), stage III   . Coronary artery disease    a. s/p MI x 4;  b. Reported h/o 17 stents with recurrent ISR;  c. 02/2014 Cath ([place]): PCI to unknown vessel;  d. 01/2015 MV: EF 18%, large scar, no ischemia; e. 03/2015 PCI: LM nl, LAD nl, D2 patent stent, RI patent stent, LCX patent stents p/m, RCA 95p ISR (PTCA), 50m, 100d into RPL CTO, RPDA 90 (2.5x16 Synergy DES).  . Hyperlipidemia   . Hypertensive heart disease   . Ischemic cardiomyopathy    a. s/p SJM ICD 2007 w/ gen change in 2012; b. 12/2014 CPX Test: mild to mod HF limitation w/ pVO2 20.3 and nl slope;  c. 01/2015 Echo: EF 20-25%.    Current Outpatient Prescriptions  Medication Sig Dispense Refill  . Alirocumab (PRALUENT) 75 MG/ML SOPN Inject 1 pen into the skin every 14 (fourteen) days. 2 pen 11  . allopurinol (ZYLOPRIM) 100 MG tablet Take 100 mg by mouth 2 (two) times daily.     . AMBULATORY NON FORMULARY MEDICATION Inject 0.125 mcg/kg/min into the vein continuous. Medication Name: Milrinone    . amLODipine (NORVASC) 5 MG tablet Take 1 tablet (5 mg total) by mouth daily. 90 tablet 3  . aspirin 81 MG tablet Take 81 mg by mouth daily.    [name] BRILINTA 90 MG TABS tablet TAKE 1 TABLET BY MOUTH TWICE A DAY 60 tablet 5  . cholecalciferol (VITAMIN D) 1000 UNITS tablet Take 1,000 Units by mouth daily.    . hydrALAZINE (APRESOLINE) 100 MG tablet Take 1 tablet (100 mg total) by mouth 3 (three) times daily. 90 tablet 3  . isosorbide mononitrate (IMDUR) 120 MG 24 hr tablet Take 1 tablet (120 mg total) by mouth daily. 30 tablet 5  . ivabradine (CORLANOR) 5 MG TABS tablet Take 0.5 tablets (2.5 mg total) by mouth 2 (two) times daily with a meal. 30 tablet 3  . Magnesium Oxide 200 MG TABS Take 1 tablet (200 mg total) by mouth 2 (two) times daily. 60 tablet 3  . omega-3 acid ethyl esters (LOVAZA) 1 G capsule Take 1 g by mouth daily.     . rosuvastatin (CRESTOR) 40 MG tablet Take 1 tablet (40  mg total) by mouth daily. 90 tablet 3  . ZETIA 10 MG tablet TAKE 1 TABLET (10 MG TOTAL) BY MOUTH DAILY. 30 tablet 2  . nitroGLYCERIN (NITROSTAT) 0.4 MG SL tablet Place 1 tablet (0.4 mg total) under the tongue every 5 (five) minutes as needed for chest pain. Reported on 04/12/2015 (Patient not taking: Reported on 10/13/2015) 60 tablet 1  . ondansetron (ZOFRAN) 4 MG tablet Take 4 mg by mouth every 8 (eight) hours as needed for nausea or vomiting. Reported on 05/24/2015  No current facility-administered medications for this encounter.     No Known Allergies    Social History   Social History  . Marital status: Legally Separated    Spouse name: N/A  . Number of children: N/A  . Years of education: N/A   Occupational History  . Not on file.   Social History Main Topics  . Smoking status: Never Smoker  . Smokeless tobacco: Not on file  . Alcohol use No  . Drug use: No  . Sexual activity: No   Other Topics Concern  . Not on file   Social History Narrative   Moved to [place] from [place] in 2016.  Lives alone in [place].      Family History  Problem Relation Age of Onset  . Cancer Mother     died when pt was 27.  She had a h/o heart dzs as well.  . Hypertension Mother   . Hyperlipidemia Father     Father died when pt was age 75 - he believes that he had a cardiac hx.  [name] CAD Sister     Vitals:   10/13/15 1016  BP: (!) 176/120  Pulse: (!) 110  SpO2: 96%  Weight: 149 lb (67.6 kg)   Wt Readings from Last 3 Encounters:  10/13/15 149 lb (67.6 kg)  09/16/15 146 lb 4 oz (66.3 kg)  09/02/15 144 lb (65.3 kg)     PHYSICAL EXAM: General: Well appearing. Ambulated in the clinic without difficulty. HEENT: normal Neck: supple. JVP ~10. Carotids 2+ bilat; no bruits. No thyromegaly or nodule noted. JVP 7-8  Cor: PMI laterallydisplaced. Tachy regular. +s3. Lungs: CTAB, normal effort Abdomen: soft, NT, ND, no HSM. No bruits or masses. +BS  Extremities: no cyanosis, clubbing, rash, edema RUE  PICC ok no erythema.  Neuro: alert & oriented x 3, cranial nerves grossly intact. moves all 4 extremities w/o difficulty. Affect pleasant.    ASSESSMENT & PLAN:  1. Chronic Systolic Heart Failure - due to iCM. EF 20-25% on echo 1/17. RV ok. Has ST Jude ICD  -Repeat CPX test with moderate functional limitation due to HF.    NYHA IIIb. Inotrope dependent. Milrinone 0.25 mcg.  ICD Interrogated in Clinic.  Corvue- Elevated fluid index.   Volume status mildly elevated. Give 20 mg lasix + 10 meq potassium daily for 2 days.  Continue 2.5 mg corlanor twice daily    Will continue to hold BB with low output for now.  Off ARNI/spiro/dig with elevated creatinine.  -- Continue hydralazine 100 mg three times a day and imdur 120 mg daily.  2. CAD- MI X4 17 stents  Most recent [place] 06/2015 - no intervention continued medical management.    Dist RCA-1 lesion, 100% stenosed. The lesion was previously treated with a stent (unknown type).  Dist RCA-2 lesion, 100% stenosed. The lesion was previously treated with a stent (unknown type).  Prox RCA lesion, 70% stenosed. The lesion was previously treated with a stent (unknown type) and angioplasty.  Mid RCA lesion, 50% stenosed. The lesion was previously treated with a stent (unknown type).  Ost RPDA to RPDA lesion, 75% stenosed. The lesion was previously treated with a drug-eluting stent.  Mid LAD lesion, 70% stenosed.  Ost 2nd Diag lesion, 70% stenosed. - Continue ASA, statin, ticagrelor. Off BB with low output.  3. HTN - Elevated. Increae amlodipine to 10 mg daily. Continue all other meds  4. Hyperlipidemia - Continue crestor 40 mg daily. 5. CKD stage III -  Follow renal function. BMET every 2 weeks by [facility].   Follow up in 4 weeks with Dr Haroldine Laws. Will HF SW to meet with him for goals of care. He understands home inotropes will not extend his life.    Ryley Teater NP-C  10:27 AM

## 2015-10-13 NOTE — Progress Notes (Signed)
Advanced Heart Failure Medication Review by a Pharmacist  Does the patient  feel that his/her medications are working for him/her?  yes  Has the patient been experiencing any side effects to the medications prescribed?  no  Does the patient measure his/her own blood pressure or blood glucose at home?  yes   Does the patient have any problems obtaining medications due to transportation or finances?   no  Understanding of regimen: good Understanding of indications: good Potential of compliance: good Patient understands to avoid NSAIDs. Patient understands to avoid decongestants.  Issues to address at subsequent visits: None   Pharmacist comments:  Mr. Marvin Martin is a pleasant 50 yo M presenting without a medication list but with good recall of his regimen. He reports good compliance with his regimen but did have an "episode" of dizziness/lightheadedness yesterday when walking to his mailbox. He did not have any other medication-related questions or concerns for me at this time.   Ruta Hinds. Velva Harman, PharmD, BCPS, CPP Clinical Pharmacist Pager: [phone] Phone: [phone] 10/13/2015 10:27 AM      Time with patient: 10 minutes Preparation and documentation time: 2 minutes Total time: 12 minutes

## 2015-10-14 ENCOUNTER — Encounter (HOSPITAL_COMMUNITY): Payer: Self-pay

## 2015-10-15 ENCOUNTER — Telehealth (HOSPITAL_COMMUNITY): Payer: Self-pay

## 2015-10-15 NOTE — Telephone Encounter (Signed)
Letter of medical necessity to relocate patient's mailbox or has mail delivered to door faxed to [place] postal office at the [facility] location at provided fax # [phone] as requested by patient. Patient made aware that this was done.  Renee Pain, RN

## 2015-10-18 DIAGNOSIS — I5023 Acute on chronic systolic (congestive) heart failure: Secondary | ICD-10-CM | POA: Diagnosis not present

## 2015-10-19 ENCOUNTER — Other Ambulatory Visit: Payer: Self-pay | Admitting: Pharmacist

## 2015-10-19 DIAGNOSIS — I130 Hypertensive heart and chronic kidney disease with heart failure and stage 1 through stage 4 chronic kidney disease, or unspecified chronic kidney disease: Secondary | ICD-10-CM | POA: Diagnosis not present

## 2015-10-19 DIAGNOSIS — I255 Ischemic cardiomyopathy: Secondary | ICD-10-CM | POA: Diagnosis not present

## 2015-10-19 DIAGNOSIS — N183 Chronic kidney disease, stage 3 (moderate): Secondary | ICD-10-CM | POA: Diagnosis not present

## 2015-10-19 DIAGNOSIS — I25110 Atherosclerotic heart disease of native coronary artery with unstable angina pectoris: Secondary | ICD-10-CM | POA: Diagnosis not present

## 2015-10-19 DIAGNOSIS — I160 Hypertensive urgency: Secondary | ICD-10-CM | POA: Diagnosis not present

## 2015-10-19 DIAGNOSIS — I5023 Acute on chronic systolic (congestive) heart failure: Secondary | ICD-10-CM | POA: Diagnosis not present

## 2015-10-19 MED ORDER — EZETIMIBE 10 MG PO TABS: 10.0000 mg | ORAL_TABLET | Freq: Every day | ORAL | 0 refills | Status: DC

## 2015-10-20 ENCOUNTER — Ambulatory Visit (HOSPITAL_COMMUNITY)
Admission: RE | Admit: 2015-10-20 | Discharge: 2015-10-20 | Disposition: A | Discharge status: Home / Self Care | Payer: Medicare Other | Source: Ambulatory Visit | Attending: Internal Medicine | Admitting: Internal Medicine

## 2015-10-20 VITALS — BP 96/60 | HR 75 | Wt 147.2 lb

## 2015-10-20 DIAGNOSIS — N183 Chronic kidney disease, stage 3 unspecified: Secondary | ICD-10-CM

## 2015-10-20 DIAGNOSIS — I5022 Chronic systolic (congestive) heart failure: Secondary | ICD-10-CM

## 2015-10-20 DIAGNOSIS — I2510 Atherosclerotic heart disease of native coronary artery without angina pectoris: Secondary | ICD-10-CM

## 2015-10-20 NOTE — Progress Notes (Signed)
CSW met with patient in the clinic today to discuss goals of care as patient on home milirone. Patient and CSW discussed thoughts and patient's specific goals. Patient mentioned he completed an advanced directive during his admission last July. CSW confirmed document in medical record. CSW continues to be available for support as needed. Raquel Sarna, LCSW [phone]

## 2015-10-20 NOTE — Progress Notes (Signed)
   Patient inadvertently scheduled for visit today. Was seen by Darrick Grinder NP-C last week.   Continues to do well on milrinone.  Will reschedule visit.   No charge for today.   Bensimhon, Daniel,MD 9:42 PM

## 2015-10-26 ENCOUNTER — Other Ambulatory Visit (HOSPITAL_COMMUNITY): Payer: Self-pay | Admitting: Internal Medicine

## 2015-10-26 DIAGNOSIS — N183 Chronic kidney disease, stage 3 (moderate): Secondary | ICD-10-CM | POA: Diagnosis not present

## 2015-10-26 DIAGNOSIS — I255 Ischemic cardiomyopathy: Secondary | ICD-10-CM | POA: Diagnosis not present

## 2015-10-26 DIAGNOSIS — I130 Hypertensive heart and chronic kidney disease with heart failure and stage 1 through stage 4 chronic kidney disease, or unspecified chronic kidney disease: Secondary | ICD-10-CM | POA: Diagnosis not present

## 2015-10-26 DIAGNOSIS — I25110 Atherosclerotic heart disease of native coronary artery with unstable angina pectoris: Secondary | ICD-10-CM | POA: Diagnosis not present

## 2015-10-26 DIAGNOSIS — I160 Hypertensive urgency: Secondary | ICD-10-CM | POA: Diagnosis not present

## 2015-10-26 DIAGNOSIS — I5023 Acute on chronic systolic (congestive) heart failure: Secondary | ICD-10-CM | POA: Diagnosis not present

## 2015-10-29 ENCOUNTER — Other Ambulatory Visit (HOSPITAL_COMMUNITY): Payer: Self-pay | Admitting: Internal Medicine

## 2015-10-29 ENCOUNTER — Encounter: Payer: Self-pay | Admitting: Internal Medicine

## 2015-10-29 DIAGNOSIS — I255 Ischemic cardiomyopathy: Secondary | ICD-10-CM | POA: Diagnosis not present

## 2015-10-29 DIAGNOSIS — I5023 Acute on chronic systolic (congestive) heart failure: Secondary | ICD-10-CM | POA: Diagnosis not present

## 2015-10-29 DIAGNOSIS — E785 Hyperlipidemia, unspecified: Secondary | ICD-10-CM | POA: Diagnosis not present

## 2015-10-29 DIAGNOSIS — Z7901 Long term (current) use of anticoagulants: Secondary | ICD-10-CM | POA: Diagnosis not present

## 2015-10-29 DIAGNOSIS — I130 Hypertensive heart and chronic kidney disease with heart failure and stage 1 through stage 4 chronic kidney disease, or unspecified chronic kidney disease: Secondary | ICD-10-CM | POA: Diagnosis not present

## 2015-10-29 DIAGNOSIS — N183 Chronic kidney disease, stage 3 (moderate): Secondary | ICD-10-CM | POA: Diagnosis not present

## 2015-10-29 DIAGNOSIS — I252 Old myocardial infarction: Secondary | ICD-10-CM | POA: Diagnosis not present

## 2015-10-29 DIAGNOSIS — Z955 Presence of coronary angioplasty implant and graft: Secondary | ICD-10-CM | POA: Diagnosis not present

## 2015-10-29 DIAGNOSIS — Z452 Encounter for adjustment and management of vascular access device: Secondary | ICD-10-CM | POA: Diagnosis not present

## 2015-10-29 DIAGNOSIS — I160 Hypertensive urgency: Secondary | ICD-10-CM | POA: Diagnosis not present

## 2015-10-29 DIAGNOSIS — Z5181 Encounter for therapeutic drug level monitoring: Secondary | ICD-10-CM | POA: Diagnosis not present

## 2015-10-29 DIAGNOSIS — Z950 Presence of cardiac pacemaker: Secondary | ICD-10-CM | POA: Diagnosis not present

## 2015-10-29 DIAGNOSIS — I25110 Atherosclerotic heart disease of native coronary artery with unstable angina pectoris: Secondary | ICD-10-CM | POA: Diagnosis not present

## 2015-11-02 DIAGNOSIS — I130 Hypertensive heart and chronic kidney disease with heart failure and stage 1 through stage 4 chronic kidney disease, or unspecified chronic kidney disease: Secondary | ICD-10-CM | POA: Diagnosis not present

## 2015-11-02 DIAGNOSIS — N183 Chronic kidney disease, stage 3 (moderate): Secondary | ICD-10-CM | POA: Diagnosis not present

## 2015-11-02 DIAGNOSIS — I160 Hypertensive urgency: Secondary | ICD-10-CM | POA: Diagnosis not present

## 2015-11-02 DIAGNOSIS — I5023 Acute on chronic systolic (congestive) heart failure: Secondary | ICD-10-CM | POA: Diagnosis not present

## 2015-11-02 DIAGNOSIS — I255 Ischemic cardiomyopathy: Secondary | ICD-10-CM | POA: Diagnosis not present

## 2015-11-02 DIAGNOSIS — I25110 Atherosclerotic heart disease of native coronary artery with unstable angina pectoris: Secondary | ICD-10-CM | POA: Diagnosis not present

## 2015-11-09 DIAGNOSIS — I160 Hypertensive urgency: Secondary | ICD-10-CM | POA: Diagnosis not present

## 2015-11-09 DIAGNOSIS — I5023 Acute on chronic systolic (congestive) heart failure: Secondary | ICD-10-CM | POA: Diagnosis not present

## 2015-11-09 DIAGNOSIS — I255 Ischemic cardiomyopathy: Secondary | ICD-10-CM | POA: Diagnosis not present

## 2015-11-09 DIAGNOSIS — N183 Chronic kidney disease, stage 3 (moderate): Secondary | ICD-10-CM | POA: Diagnosis not present

## 2015-11-09 DIAGNOSIS — I25110 Atherosclerotic heart disease of native coronary artery with unstable angina pectoris: Secondary | ICD-10-CM | POA: Diagnosis not present

## 2015-11-09 DIAGNOSIS — I130 Hypertensive heart and chronic kidney disease with heart failure and stage 1 through stage 4 chronic kidney disease, or unspecified chronic kidney disease: Secondary | ICD-10-CM | POA: Diagnosis not present

## 2015-11-16 ENCOUNTER — Other Ambulatory Visit (HOSPITAL_COMMUNITY): Payer: Self-pay | Admitting: Internal Medicine

## 2015-11-16 DIAGNOSIS — I130 Hypertensive heart and chronic kidney disease with heart failure and stage 1 through stage 4 chronic kidney disease, or unspecified chronic kidney disease: Secondary | ICD-10-CM | POA: Diagnosis not present

## 2015-11-16 DIAGNOSIS — I160 Hypertensive urgency: Secondary | ICD-10-CM | POA: Diagnosis not present

## 2015-11-16 DIAGNOSIS — I5023 Acute on chronic systolic (congestive) heart failure: Secondary | ICD-10-CM | POA: Diagnosis not present

## 2015-11-16 DIAGNOSIS — I255 Ischemic cardiomyopathy: Secondary | ICD-10-CM | POA: Diagnosis not present

## 2015-11-16 DIAGNOSIS — I25110 Atherosclerotic heart disease of native coronary artery with unstable angina pectoris: Secondary | ICD-10-CM | POA: Diagnosis not present

## 2015-11-16 DIAGNOSIS — N183 Chronic kidney disease, stage 3 (moderate): Secondary | ICD-10-CM | POA: Diagnosis not present

## 2015-11-19 DIAGNOSIS — I160 Hypertensive urgency: Secondary | ICD-10-CM | POA: Diagnosis not present

## 2015-11-19 DIAGNOSIS — I5023 Acute on chronic systolic (congestive) heart failure: Secondary | ICD-10-CM | POA: Diagnosis not present

## 2015-11-19 DIAGNOSIS — I130 Hypertensive heart and chronic kidney disease with heart failure and stage 1 through stage 4 chronic kidney disease, or unspecified chronic kidney disease: Secondary | ICD-10-CM | POA: Diagnosis not present

## 2015-11-19 DIAGNOSIS — I255 Ischemic cardiomyopathy: Secondary | ICD-10-CM | POA: Diagnosis not present

## 2015-11-19 DIAGNOSIS — I25110 Atherosclerotic heart disease of native coronary artery with unstable angina pectoris: Secondary | ICD-10-CM | POA: Diagnosis not present

## 2015-11-19 DIAGNOSIS — N183 Chronic kidney disease, stage 3 (moderate): Secondary | ICD-10-CM | POA: Diagnosis not present

## 2015-11-23 DIAGNOSIS — I255 Ischemic cardiomyopathy: Secondary | ICD-10-CM | POA: Diagnosis not present

## 2015-11-23 DIAGNOSIS — I130 Hypertensive heart and chronic kidney disease with heart failure and stage 1 through stage 4 chronic kidney disease, or unspecified chronic kidney disease: Secondary | ICD-10-CM | POA: Diagnosis not present

## 2015-11-23 DIAGNOSIS — I5023 Acute on chronic systolic (congestive) heart failure: Secondary | ICD-10-CM | POA: Diagnosis not present

## 2015-11-23 DIAGNOSIS — I160 Hypertensive urgency: Secondary | ICD-10-CM | POA: Diagnosis not present

## 2015-11-23 DIAGNOSIS — N183 Chronic kidney disease, stage 3 (moderate): Secondary | ICD-10-CM | POA: Diagnosis not present

## 2015-11-23 DIAGNOSIS — I25110 Atherosclerotic heart disease of native coronary artery with unstable angina pectoris: Secondary | ICD-10-CM | POA: Diagnosis not present

## 2015-11-30 DIAGNOSIS — N183 Chronic kidney disease, stage 3 (moderate): Secondary | ICD-10-CM | POA: Diagnosis not present

## 2015-11-30 DIAGNOSIS — I252 Old myocardial infarction: Secondary | ICD-10-CM | POA: Diagnosis not present

## 2015-11-30 DIAGNOSIS — I130 Hypertensive heart and chronic kidney disease with heart failure and stage 1 through stage 4 chronic kidney disease, or unspecified chronic kidney disease: Secondary | ICD-10-CM | POA: Diagnosis not present

## 2015-11-30 DIAGNOSIS — I160 Hypertensive urgency: Secondary | ICD-10-CM | POA: Diagnosis not present

## 2015-11-30 DIAGNOSIS — I255 Ischemic cardiomyopathy: Secondary | ICD-10-CM | POA: Diagnosis not present

## 2015-11-30 DIAGNOSIS — Z452 Encounter for adjustment and management of vascular access device: Secondary | ICD-10-CM | POA: Diagnosis not present

## 2015-11-30 DIAGNOSIS — E785 Hyperlipidemia, unspecified: Secondary | ICD-10-CM | POA: Diagnosis not present

## 2015-11-30 DIAGNOSIS — I25110 Atherosclerotic heart disease of native coronary artery with unstable angina pectoris: Secondary | ICD-10-CM | POA: Diagnosis not present

## 2015-11-30 DIAGNOSIS — Z0189 Encounter for other specified special examinations: Secondary | ICD-10-CM | POA: Diagnosis not present

## 2015-11-30 DIAGNOSIS — I5023 Acute on chronic systolic (congestive) heart failure: Secondary | ICD-10-CM | POA: Diagnosis not present

## 2015-12-01 ENCOUNTER — Ambulatory Visit (HOSPITAL_COMMUNITY)
Admission: RE | Admit: 2015-12-01 | Discharge: 2015-12-01 | Disposition: A | Discharge status: Home / Self Care | Payer: Medicare Other | Source: Ambulatory Visit | Attending: Internal Medicine | Admitting: Internal Medicine

## 2015-12-01 ENCOUNTER — Encounter (HOSPITAL_COMMUNITY): Payer: Self-pay

## 2015-12-01 VITALS — BP 122/68 | HR 101 | Wt 151.0 lb

## 2015-12-01 DIAGNOSIS — N183 Chronic kidney disease, stage 3 unspecified: Secondary | ICD-10-CM

## 2015-12-01 DIAGNOSIS — I10 Essential (primary) hypertension: Secondary | ICD-10-CM | POA: Diagnosis not present

## 2015-12-01 DIAGNOSIS — I130 Hypertensive heart and chronic kidney disease with heart failure and stage 1 through stage 4 chronic kidney disease, or unspecified chronic kidney disease: Secondary | ICD-10-CM | POA: Diagnosis not present

## 2015-12-01 DIAGNOSIS — I2510 Atherosclerotic heart disease of native coronary artery without angina pectoris: Secondary | ICD-10-CM | POA: Diagnosis not present

## 2015-12-01 DIAGNOSIS — I25118 Atherosclerotic heart disease of native coronary artery with other forms of angina pectoris: Secondary | ICD-10-CM

## 2015-12-01 DIAGNOSIS — I255 Ischemic cardiomyopathy: Secondary | ICD-10-CM

## 2015-12-01 DIAGNOSIS — I5023 Acute on chronic systolic (congestive) heart failure: Secondary | ICD-10-CM | POA: Diagnosis not present

## 2015-12-01 DIAGNOSIS — Z8249 Family history of ischemic heart disease and other diseases of the circulatory system: Secondary | ICD-10-CM | POA: Insufficient documentation

## 2015-12-01 DIAGNOSIS — E785 Hyperlipidemia, unspecified: Secondary | ICD-10-CM | POA: Diagnosis not present

## 2015-12-01 DIAGNOSIS — R000 Tachycardia, unspecified: Secondary | ICD-10-CM | POA: Insufficient documentation

## 2015-12-01 DIAGNOSIS — Z79899 Other long term (current) drug therapy: Secondary | ICD-10-CM | POA: Diagnosis not present

## 2015-12-01 DIAGNOSIS — M109 Gout, unspecified: Secondary | ICD-10-CM | POA: Diagnosis not present

## 2015-12-01 DIAGNOSIS — I252 Old myocardial infarction: Secondary | ICD-10-CM | POA: Diagnosis not present

## 2015-12-01 DIAGNOSIS — Z809 Family history of malignant neoplasm, unspecified: Secondary | ICD-10-CM | POA: Diagnosis not present

## 2015-12-01 DIAGNOSIS — Z7982 Long term (current) use of aspirin: Secondary | ICD-10-CM | POA: Insufficient documentation

## 2015-12-01 DIAGNOSIS — I5042 Chronic combined systolic (congestive) and diastolic (congestive) heart failure: Secondary | ICD-10-CM

## 2015-12-01 MED ORDER — IVABRADINE HCL 5 MG PO TABS: 5.0000 mg | ORAL_TABLET | Freq: Two times a day (BID) | ORAL | 3 refills | Status: DC

## 2015-12-01 NOTE — Progress Notes (Signed)
Patient ID: Marvin Martin, male   DOB: 1965-03-26, 50 y.o.   MRN: [id number]    Advanced Heart Failure Clinic Note   Referring Physician: Dr Laney Pastor Primary Care: Dr Laney Pastor Primary Cardiologist: Dr. Haroldine Laws   HPI: [name] is a 50 year old male with history of CAD, ICM, MI X4 (presents with chest burning radiating to L shoulder) with 17 stents, chronic systolic HF with EF 67% s/p St. Jude ICD, HTN, gout, and hyperlipidemia.  He just relocated to [facility] from [place]. His medical care was provided by [facility]. He has struggled with in-stent restenosis. Most recent heart cath was February 2016 with stent placed. He was referred to HF clinic by Dr Laney Pastor to establish HF care.    Admitted May 8th through May 12th, 2017 with chest pain. Had LHC as noted below no intervention.   Admitted June 5th through June 14th, 2094 with A/C systolic CHF. Had repeat R/LHC as below with stable CAD and low output. Started on milrinone and weaned to 0.125 mcg/kg/min for d/c. Coreg, lasix, and spiro stopped.  Entresto dropped from full dose to 24/26 mg, subsequently stopped.   He returns today for regular follow up.  Feeling great, "No complaints". Gets a little SOB after about a block, then he has to take a break with the weather being so cold. Still has leg fatigue with walking.  Weight at home around 145-148. No orthopnea/PND/CP. Denies lightheadedness or dizziness. Remains on milrinone 0.25 mcg/kg/min. Appetite stable. Taking all medications as directed. [facility] following. [facility]ses [employer] for transportation. Had lab draws yesterday.    01/2015 ECHO  EF 20-25% RV normal   Corevue interrogator not functioning properly in clinic today.   R/LHC 06/23/15  Dist RCA-1 lesion, 100% stenosed. The lesion was previously treated with a stent (unknown type).  Dist RCA-2 lesion, 100% stenosed. The lesion was previously treated with a stent (unknown type).  Prox RCA lesion, 70% stenosed. The lesion was  previously treated with a stent (unknown type) and angioplasty.  Mid RCA lesion, 50% stenosed. The lesion was previously treated with a stent (unknown type).  Ost RPDA to RPDA lesion, 75% stenosed. The lesion was previously treated with a drug-eluting stent.  Mid LAD lesion, 70% stenosed.  Ost 2nd Diag lesion, 70% stenosed. Ao = 87/62 (71) LV = 81/9 RA = 2 RV = 20/0/2 PA = 24/12 (16) PCW = 4 Fick cardiac output/index = 3.1/1.8 Thermo CO/CI = 3.0/1.7 PVR = 3.9 WU Ao sat = 97% PA sat = 54%, 55%  R/LHC 05/27/15  Dist RCA-1 lesion, 100% stenosed. The lesion was previously treated with a stent (unknown type).  Dist RCA-2 lesion, 100% stenosed. The lesion was previously treated with a stent (unknown type).  Prox RCA lesion, 60% stenosed. The lesion was previously treated with a stent (unknown type) and angioplasty.  Mid RCA lesion, 40% stenosed. The lesion was previously treated with a stent (unknown type). Findings: RA = 1 RV = 22/1/5 PA = 27/13 (19) PCW = 6 Ao = 120/78 (96) LV = 124/4/13 Fick cardiac output/index = 3.3/1.9 PVR = 4.0 WU SVR = 2335  FA sat = 94% PA sat = 60%. 60%  CPX 01/15/15 FVC 2.67 (65%)    FEV1 2.18 (64%)     FEV1/FVC 81 (100%)     MVV 105 (73%) Peak VO2: 20.3 (56.3% predicted peak VO2) VE/VCO2 slope: 26.8 OUES: 1.52 Peak RER: 1.21  CPX 06/2015  Peak VO2: 19.9 (55.8% predicted peak VO2)  VE/VCO2 slope: 31.6 OUES: 1.42 Peak RER: 1.26  Labs: 12/16: K 4.0 Creatinine 1.7 Hgb 13.2 MCV 107 (denies ETOH) 05/28/2015: K 4.4 Creatinine 1.86 08/27/2015: K 4.6 Creatinine 2.6  09/07/2015: K 5.0 Creatinine 2.35 09/16/2015: K 4.3 Creatinine 2.16  09/21/2015: K 4.4 Creatinine 1.71    SH: Separated from his wife. Has 4 children. 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         Father deceased but he is unsure of the cause. He had hyperlipidemia.         Sister: Heart disease  [facility]  [address] [place] [address] [phone]   Past Medical History:  Diagnosis Date  . Chronic systolic CHF (congestive heart failure) ([place])    a. 01/2015 Echo: EF 20-25%, antlat, inflat AK.  [name] CKD (chronic kidney disease), stage III   . Coronary artery disease    a. s/p MI x 4;  b. Reported h/o 17 stents with recurrent ISR;  c. 02/2014 Cath ([place]): PCI to unknown vessel;  d. 01/2015 MV: EF 18%, large scar, no ischemia; e. 03/2015 PCI: LM nl, LAD nl, D2 patent stent, RI patent stent, LCX patent stents p/m, RCA 95p ISR (PTCA), 57m, 100d into RPL CTO, RPDA 90 (2.5x16 Synergy DES).  . Hyperlipidemia   . Hypertensive heart disease   . Ischemic cardiomyopathy    a. s/p SJM ICD 2007 w/ gen change in 2012; b. 12/2014 CPX Test: mild to mod HF limitation w/ pVO2 20.3 and nl slope;  c. 01/2015 Echo: EF 20-25%.    Current Outpatient Prescriptions  Medication Sig Dispense Refill  . Alirocumab (PRALUENT) 75 MG/ML SOPN Inject 1 pen into the skin every 14 (fourteen) days. 2 pen 11  . allopurinol (ZYLOPRIM) 100 MG tablet Take 100 mg by mouth 2 (two) times daily.     . AMBULATORY NON FORMULARY MEDICATION Inject 0.125 mcg/kg/min into the vein continuous. Medication Name: Milrinone    . amLODipine (NORVASC) 10 MG tablet Take 1 tablet (10 mg total) by mouth daily. 30 tablet 3  . aspirin 81 MG tablet Take 81 mg by mouth daily.    [name] BRILINTA 90 MG TABS tablet TAKE 1 TABLET BY MOUTH TWICE A DAY 60 tablet 5  . cholecalciferol (VITAMIN D) 1000 UNITS tablet Take 1,000 Units by mouth daily.    [name] ezetimibe (ZETIA) 10 MG tablet Take 1 tablet (10 mg total) by mouth daily. 90 tablet 0  . hydrALAZINE (APRESOLINE) 100 MG tablet Take 1 tablet (100 mg total) by mouth 3 (three) times daily. 90 tablet 3  . isosorbide mononitrate (IMDUR) 120 MG 24 hr tablet Take 1 tablet (120 mg total) by mouth daily. 30 tablet 5  . ivabradine (CORLANOR) 5 MG TABS tablet Take 0.5 tablets (2.5 mg total) by mouth 2 (two) times daily with a meal. 30  tablet 3  . Magnesium Oxide 200 MG TABS Take 1 tablet (200 mg total) by mouth 2 (two) times daily. 60 tablet 3  . omega-3 acid ethyl esters (LOVAZA) 1 G capsule Take 1 g by mouth daily.     . rosuvastatin (CRESTOR) 40 MG tablet Take 1 tablet (40 mg total) by mouth daily. 90 tablet 3  . nitroGLYCERIN (NITROSTAT) 0.4 MG SL tablet Place 1 tablet (0.4 mg total) under the tongue every 5 (five) minutes as needed for chest pain. Reported on 04/12/2015 (Patient not taking: Reported on 12/01/2015) 60 tablet 1  . ondansetron (ZOFRAN)  4 MG tablet Take 4 mg by mouth every 8 (eight) hours as needed for nausea or vomiting. Reported on 05/24/2015     No current facility-administered medications for this encounter.     No Known Allergies    Social History   Social History  . Marital status: Legally Separated    Spouse name: N/A  . Number of children: N/A  . Years of education: N/A   Occupational History  . Not on file.   Social History Main Topics  . Smoking status: Never Smoker  . Smokeless tobacco: Not on file  . Alcohol use No  . Drug use: No  . Sexual activity: No   Other Topics Concern  . Not on file   Social History Narrative   Moved to [place] from [place] in 2016.  Lives alone in [place].      Family History  Problem Relation Age of Onset  . Cancer Mother     died when pt was 89.  She had a h/o heart dzs as well.  . Hypertension Mother   . Hyperlipidemia Father     Father died when pt was age 50 - he believes that he had a cardiac hx.  [name] CAD Sister     Vitals:   12/01/15 0932  BP: 122/68  Pulse: (!) 101  SpO2: 100%  Weight: 151 lb (68.5 kg)   Wt Readings from Last 3 Encounters:  12/01/15 151 lb (68.5 kg)  10/20/15 147 lb 4 oz (66.8 kg)  10/13/15 149 lb (67.6 kg)     PHYSICAL EXAM: General: Well appearing. NAD  HEENT: Normal Neck: supple. JVP 7-8 cm. Carotids 2+ bilat; no bruits. No thyromegaly or nodule noted. JVP 7-8  Cor: PMI laterallydisplaced. Tachy regular. +s3. Lungs:  Clear, normal effort Abdomen: soft, NT, ND, no HSM. No bruits or masses. +BS  Extremities: no cyanosis, clubbing, rash, edema. RUE PICC stable. New dressing 11/30/15.   Neuro: alert & oriented x 3, cranial nerves grossly intact. moves all 4 extremities w/o difficulty. Affect pleasant.    ASSESSMENT & PLAN:  1. Chronic Systolic Heart Failure - due to iCM. EF 20-25% on echo 1/17. RV ok. Has ST Jude ICD  -Repeat CPX test with moderate functional limitation due to HF.    NYHA IIIb. Inotrope dependent. Milrinone 0.25 mcg.  - Volume status looks stable on exam. Continue lasix as needed.   - Increase Corlanor to 5 mg BID.    - Will continue to hold BB with low output for now.  - Off ARNI/spiro/dig with elevated creatinine. Can consider low dose spiro  - Continue hydralazine 100 mg three times a day and imdur 120 mg daily.  2. CAD- MI X4 17 stents  Most recent [place] 06/2015 - no intervention continued medical management.    Dist RCA-1 lesion, 100% stenosed. The lesion was previously treated with a stent (unknown type).  Dist RCA-2 lesion, 100% stenosed. The lesion was previously treated with a stent (unknown type).  Prox RCA lesion, 70% stenosed. The lesion was previously treated with a stent (unknown type) and angioplasty.  Mid RCA lesion, 50% stenosed. The lesion was previously treated with a stent (unknown type).  Ost RPDA to RPDA lesion, 75% stenosed. The lesion was previously treated with a drug-eluting stent.  Mid LAD lesion, 70% stenosed.  Ost 2nd Diag lesion, 70% stenosed. - Continue ASA, statin, ticagrelor. Off BB with low output.  3. HTN - BP stable.  - Continue amlodipine to 10 mg  daily.  - Continue all other meds as above 4. Hyperlipidemia - Continue crestor 40 mg daily. 5. CKD stage III  - Had labs yesterday by [facility]. Await results.    Increase ivabridine to 5 mg daily with ongoing tachycardia.  With recently stable Creatinine and K, can consider low dose Losartan at  pharmacy visit.    Satira Mccallum Talayia Hjort PA-C  9:53 AM

## 2015-12-01 NOTE — Patient Instructions (Addendum)
INCREASE Ivabradine to 5mg  twice daily.  Follow up with Doroteo Bradford in 4-6 weeks.  Follow up with Dr.Bensimhon in 3 months.

## 2015-12-01 NOTE — Progress Notes (Signed)
Advanced Heart Failure Medication Review by a Pharmacist  Does the patient  feel that his/her medications are working for him/her?  yes  Has the patient been experiencing any side effects to the medications prescribed?  no  Does the patient measure his/her own blood pressure or blood glucose at home?  yes   Does the patient have any problems obtaining medications due to transportation or finances?   no  Understanding of regimen: good Understanding of indications: good Potential of compliance: good Patient understands to avoid NSAIDs. Patient understands to avoid decongestants.  Issues to address at subsequent visits: None   Pharmacist comments:  Marvin Martin is a pleasant 50 yo M presenting without a medication list but with good recall of his regimen. He reports good compliance with his regimen and did not have any specific medication-related questions or concerns for me at this time.   Ruta Hinds. Velva Harman, PharmD, BCPS, CPP Clinical Pharmacist Pager: [phone] Phone: [phone] 12/01/2015 9:42 AM      Time with patient: 10 minutes Preparation and documentation time: 2 minutes Total time: 12 minutes

## 2015-12-03 ENCOUNTER — Other Ambulatory Visit (HOSPITAL_COMMUNITY): Payer: Self-pay | Admitting: Internal Medicine

## 2015-12-07 ENCOUNTER — Other Ambulatory Visit (HOSPITAL_COMMUNITY): Payer: Self-pay | Admitting: *Deleted

## 2015-12-07 DIAGNOSIS — I255 Ischemic cardiomyopathy: Secondary | ICD-10-CM | POA: Diagnosis not present

## 2015-12-07 DIAGNOSIS — I25110 Atherosclerotic heart disease of native coronary artery with unstable angina pectoris: Secondary | ICD-10-CM | POA: Diagnosis not present

## 2015-12-07 DIAGNOSIS — Z0189 Encounter for other specified special examinations: Secondary | ICD-10-CM | POA: Diagnosis not present

## 2015-12-07 DIAGNOSIS — I160 Hypertensive urgency: Secondary | ICD-10-CM | POA: Diagnosis not present

## 2015-12-07 DIAGNOSIS — I130 Hypertensive heart and chronic kidney disease with heart failure and stage 1 through stage 4 chronic kidney disease, or unspecified chronic kidney disease: Secondary | ICD-10-CM | POA: Diagnosis not present

## 2015-12-07 DIAGNOSIS — N183 Chronic kidney disease, stage 3 (moderate): Secondary | ICD-10-CM | POA: Diagnosis not present

## 2015-12-07 DIAGNOSIS — I5023 Acute on chronic systolic (congestive) heart failure: Secondary | ICD-10-CM | POA: Diagnosis not present

## 2015-12-07 MED ORDER — MAGNESIUM OXIDE -MG SUPPLEMENT 200 MG PO TABS: 200.0000 mg | ORAL_TABLET | Freq: Two times a day (BID) | ORAL | 3 refills | Status: DC

## 2015-12-14 DIAGNOSIS — E785 Hyperlipidemia, unspecified: Secondary | ICD-10-CM | POA: Diagnosis not present

## 2015-12-14 DIAGNOSIS — I255 Ischemic cardiomyopathy: Secondary | ICD-10-CM | POA: Diagnosis not present

## 2015-12-14 DIAGNOSIS — Z5181 Encounter for therapeutic drug level monitoring: Secondary | ICD-10-CM | POA: Diagnosis not present

## 2015-12-14 DIAGNOSIS — I25110 Atherosclerotic heart disease of native coronary artery with unstable angina pectoris: Secondary | ICD-10-CM | POA: Diagnosis not present

## 2015-12-14 DIAGNOSIS — N183 Chronic kidney disease, stage 3 (moderate): Secondary | ICD-10-CM | POA: Diagnosis not present

## 2015-12-14 DIAGNOSIS — Z452 Encounter for adjustment and management of vascular access device: Secondary | ICD-10-CM | POA: Diagnosis not present

## 2015-12-14 DIAGNOSIS — I130 Hypertensive heart and chronic kidney disease with heart failure and stage 1 through stage 4 chronic kidney disease, or unspecified chronic kidney disease: Secondary | ICD-10-CM | POA: Diagnosis not present

## 2015-12-14 DIAGNOSIS — I5023 Acute on chronic systolic (congestive) heart failure: Secondary | ICD-10-CM | POA: Diagnosis not present

## 2015-12-14 DIAGNOSIS — I160 Hypertensive urgency: Secondary | ICD-10-CM | POA: Diagnosis not present

## 2015-12-21 DIAGNOSIS — I5023 Acute on chronic systolic (congestive) heart failure: Secondary | ICD-10-CM | POA: Diagnosis not present

## 2015-12-21 DIAGNOSIS — I255 Ischemic cardiomyopathy: Secondary | ICD-10-CM | POA: Diagnosis not present

## 2015-12-21 DIAGNOSIS — N183 Chronic kidney disease, stage 3 (moderate): Secondary | ICD-10-CM | POA: Diagnosis not present

## 2015-12-21 DIAGNOSIS — I160 Hypertensive urgency: Secondary | ICD-10-CM | POA: Diagnosis not present

## 2015-12-21 DIAGNOSIS — I25110 Atherosclerotic heart disease of native coronary artery with unstable angina pectoris: Secondary | ICD-10-CM | POA: Diagnosis not present

## 2015-12-21 DIAGNOSIS — I130 Hypertensive heart and chronic kidney disease with heart failure and stage 1 through stage 4 chronic kidney disease, or unspecified chronic kidney disease: Secondary | ICD-10-CM | POA: Diagnosis not present

## 2015-12-23 DIAGNOSIS — I130 Hypertensive heart and chronic kidney disease with heart failure and stage 1 through stage 4 chronic kidney disease, or unspecified chronic kidney disease: Secondary | ICD-10-CM | POA: Diagnosis not present

## 2015-12-23 DIAGNOSIS — I25110 Atherosclerotic heart disease of native coronary artery with unstable angina pectoris: Secondary | ICD-10-CM | POA: Diagnosis not present

## 2015-12-23 DIAGNOSIS — I160 Hypertensive urgency: Secondary | ICD-10-CM | POA: Diagnosis not present

## 2015-12-23 DIAGNOSIS — I5023 Acute on chronic systolic (congestive) heart failure: Secondary | ICD-10-CM | POA: Diagnosis not present

## 2015-12-23 DIAGNOSIS — I255 Ischemic cardiomyopathy: Secondary | ICD-10-CM | POA: Diagnosis not present

## 2015-12-23 DIAGNOSIS — N183 Chronic kidney disease, stage 3 (moderate): Secondary | ICD-10-CM | POA: Diagnosis not present

## 2015-12-27 DIAGNOSIS — N183 Chronic kidney disease, stage 3 (moderate): Secondary | ICD-10-CM | POA: Diagnosis not present

## 2015-12-28 DIAGNOSIS — Z452 Encounter for adjustment and management of vascular access device: Secondary | ICD-10-CM | POA: Diagnosis not present

## 2015-12-28 DIAGNOSIS — N183 Chronic kidney disease, stage 3 (moderate): Secondary | ICD-10-CM | POA: Diagnosis not present

## 2015-12-28 DIAGNOSIS — Z950 Presence of cardiac pacemaker: Secondary | ICD-10-CM | POA: Diagnosis not present

## 2015-12-28 DIAGNOSIS — I25110 Atherosclerotic heart disease of native coronary artery with unstable angina pectoris: Secondary | ICD-10-CM | POA: Diagnosis not present

## 2015-12-28 DIAGNOSIS — I255 Ischemic cardiomyopathy: Secondary | ICD-10-CM | POA: Diagnosis not present

## 2015-12-28 DIAGNOSIS — Z955 Presence of coronary angioplasty implant and graft: Secondary | ICD-10-CM | POA: Diagnosis not present

## 2015-12-28 DIAGNOSIS — Z7901 Long term (current) use of anticoagulants: Secondary | ICD-10-CM | POA: Diagnosis not present

## 2015-12-28 DIAGNOSIS — I130 Hypertensive heart and chronic kidney disease with heart failure and stage 1 through stage 4 chronic kidney disease, or unspecified chronic kidney disease: Secondary | ICD-10-CM | POA: Diagnosis not present

## 2015-12-28 DIAGNOSIS — I5023 Acute on chronic systolic (congestive) heart failure: Secondary | ICD-10-CM | POA: Diagnosis not present

## 2015-12-28 DIAGNOSIS — Z5181 Encounter for therapeutic drug level monitoring: Secondary | ICD-10-CM | POA: Diagnosis not present

## 2015-12-28 DIAGNOSIS — E785 Hyperlipidemia, unspecified: Secondary | ICD-10-CM | POA: Diagnosis not present

## 2015-12-28 DIAGNOSIS — I252 Old myocardial infarction: Secondary | ICD-10-CM | POA: Diagnosis not present

## 2015-12-29 DIAGNOSIS — I5023 Acute on chronic systolic (congestive) heart failure: Secondary | ICD-10-CM | POA: Diagnosis not present

## 2015-12-29 DIAGNOSIS — I130 Hypertensive heart and chronic kidney disease with heart failure and stage 1 through stage 4 chronic kidney disease, or unspecified chronic kidney disease: Secondary | ICD-10-CM | POA: Diagnosis not present

## 2015-12-29 DIAGNOSIS — I25110 Atherosclerotic heart disease of native coronary artery with unstable angina pectoris: Secondary | ICD-10-CM | POA: Diagnosis not present

## 2015-12-29 DIAGNOSIS — I255 Ischemic cardiomyopathy: Secondary | ICD-10-CM | POA: Diagnosis not present

## 2015-12-29 DIAGNOSIS — I252 Old myocardial infarction: Secondary | ICD-10-CM | POA: Diagnosis not present

## 2015-12-29 DIAGNOSIS — N183 Chronic kidney disease, stage 3 (moderate): Secondary | ICD-10-CM | POA: Diagnosis not present

## 2015-12-31 ENCOUNTER — Other Ambulatory Visit (HOSPITAL_COMMUNITY): Payer: Self-pay | Admitting: Internal Medicine

## 2016-01-04 ENCOUNTER — Other Ambulatory Visit (HOSPITAL_COMMUNITY): Payer: Self-pay | Admitting: *Deleted

## 2016-01-04 DIAGNOSIS — N2581 Secondary hyperparathyroidism of renal origin: Secondary | ICD-10-CM | POA: Diagnosis not present

## 2016-01-04 DIAGNOSIS — I509 Heart failure, unspecified: Secondary | ICD-10-CM | POA: Diagnosis not present

## 2016-01-04 DIAGNOSIS — I10 Essential (primary) hypertension: Secondary | ICD-10-CM | POA: Diagnosis not present

## 2016-01-04 DIAGNOSIS — N183 Chronic kidney disease, stage 3 (moderate): Secondary | ICD-10-CM | POA: Diagnosis not present

## 2016-01-04 MED ORDER — AMLODIPINE BESYLATE 10 MG PO TABS: 10.0000 mg | ORAL_TABLET | Freq: Every day | ORAL | 3 refills | Status: DC

## 2016-01-04 MED ORDER — MAGNESIUM OXIDE -MG SUPPLEMENT 200 MG PO TABS: 200.0000 mg | ORAL_TABLET | Freq: Two times a day (BID) | ORAL | 3 refills | Status: DC

## 2016-01-05 ENCOUNTER — Ambulatory Visit (HOSPITAL_COMMUNITY)
Admission: RE | Admit: 2016-01-05 | Discharge: 2016-01-05 | Disposition: A | Discharge status: Home / Self Care | Payer: Medicare Other | Source: Ambulatory Visit | Attending: Cardiology | Admitting: Cardiology

## 2016-01-05 VITALS — BP 150/98 | HR 98 | Wt 154.0 lb

## 2016-01-05 DIAGNOSIS — I2510 Atherosclerotic heart disease of native coronary artery without angina pectoris: Secondary | ICD-10-CM | POA: Diagnosis not present

## 2016-01-05 DIAGNOSIS — I252 Old myocardial infarction: Secondary | ICD-10-CM | POA: Diagnosis not present

## 2016-01-05 DIAGNOSIS — I5022 Chronic systolic (congestive) heart failure: Secondary | ICD-10-CM | POA: Insufficient documentation

## 2016-01-05 DIAGNOSIS — I130 Hypertensive heart and chronic kidney disease with heart failure and stage 1 through stage 4 chronic kidney disease, or unspecified chronic kidney disease: Secondary | ICD-10-CM | POA: Insufficient documentation

## 2016-01-05 DIAGNOSIS — N183 Chronic kidney disease, stage 3 (moderate): Secondary | ICD-10-CM | POA: Diagnosis not present

## 2016-01-05 DIAGNOSIS — E785 Hyperlipidemia, unspecified: Secondary | ICD-10-CM | POA: Diagnosis not present

## 2016-01-05 DIAGNOSIS — M109 Gout, unspecified: Secondary | ICD-10-CM | POA: Insufficient documentation

## 2016-01-05 DIAGNOSIS — I5023 Acute on chronic systolic (congestive) heart failure: Secondary | ICD-10-CM | POA: Diagnosis not present

## 2016-01-05 DIAGNOSIS — I25110 Atherosclerotic heart disease of native coronary artery with unstable angina pectoris: Secondary | ICD-10-CM | POA: Diagnosis not present

## 2016-01-05 DIAGNOSIS — I255 Ischemic cardiomyopathy: Secondary | ICD-10-CM | POA: Diagnosis not present

## 2016-01-05 LAB — BASIC METABOLIC PANEL
Anion gap: 9 (ref 5–15)
BUN: 25 mg/dL — AB (ref 6–20)
CALCIUM: 9.5 mg/dL (ref 8.9–10.3)
CHLORIDE: 108 mmol/L (ref 101–111)
CO2: 22 mmol/L (ref 22–32)
CREATININE: 2.05 mg/dL — AB (ref 0.61–1.24)
GFR calc non Af Amer: 36 mL/min — ABNORMAL LOW (ref >60)
GFR, EST AFRICAN AMERICAN: 42 mL/min — AB (ref >60)
Glucose, Bld: 103 mg/dL — ABNORMAL HIGH (ref 65–99)
Potassium: 4.4 mmol/L (ref 3.5–5.1)
SODIUM: 139 mmol/L (ref 135–145)

## 2016-01-05 MED ORDER — LOSARTAN POTASSIUM 25 MG PO TABS: 12.5000 mg | ORAL_TABLET | Freq: Every day | ORAL | 5 refills | Status: DC

## 2016-01-05 NOTE — Progress Notes (Signed)
HPI:  Marvin Martin is a 50 yo AA male with history of CAD, ICM, MI X4 (presents with chest burning radiating to L shoulder) with 17 stents, chronic systolic HF with EF 53% s/p St. Jude ICD, HTN, gout, and hyperlipidemia. He just relocated to [facility] from [place]. His medical care was provided by [facility]. He has struggled with in-stent restenosis. Most recent heart cath was February 2016 with stent placed. He was referred to HF clinic by Dr Laney Pastor to establish HF care.   Admitted June 5th through June 14th, 2992 with A/C systolic CHF. Had repeat R/LHC as below with stable CAD and low output. Started on milrinone and weaned to 0.125 mcg/kg/min for d/c. Coreg, lasix, and spiro stopped. Entresto dropped from full dose to 24/26 mg, subsequently stopped for elevation in SCr.   He returns today for pharmacist-led HF medication titration and reconciliation. At last HF clinic visit on 12/01/15, his Corlanor was increased to 5 mg BID. Remains on milrinone at 0.125 mcg/kg/min. Overall he is feeling well and is now able to walk a block before getting SOB. Appetite also improved. Denies SOB/PND/Orthopnea. [facility] following. Disabled since 2006.   [name] Shortness of breath/dyspnea on exertion? no  . Orthopnea/PND? no . Edema? no . Lightheadedness/dizziness? no . Daily weights at home? Yes ~141-145 lb  . Blood pressure/heart rate monitoring at home? no . Following low-sodium/fluid-restricted diet? Yes   HF Medications: Hydralazine 100 mg PO TID Isosorbide mononitrate 120 mg PO daily Corlanor 5 mg PO BID  Milrinone 0.125 mcg/kg/min   Has the patient been experiencing any side effects to the medications prescribed?  no  Does the patient have any problems obtaining medications due to transportation or finances?   No - Medicare Part D covers meds 100%  Understanding of regimen: good Understanding of indications: good Potential of compliance: good Patient understands to avoid NSAIDs. Patient  understands to avoid decongestants.    Pertinent Lab Values: . 01/05/2016: Serum creatinine 2.05 (BL ~1.9-2), BUN 25, Potassium 4.4, Sodium 139 . 09/16/15: coox 63%  Vital Signs: . Weight: 154 lb (dry weight: ~150-155 lb) . Blood pressure: 150/98 mmHg  . Heart rate: 98 bpm   Assessment: 1. Chronicsystolic CHF (EF 42-68%), due to ICM. NYHA class IIIsymptoms. - Volume status seems stable today despite slight increase in weight which he attributes to increased appetite - Continue hydralazine 100 mg TID, isosorbide mononitrate 120 mg daily, Corlanor 5 mg BID and milrinone 0.125 mcg/kg/min - Add low dose losartan 12.5 mg daily with stable SCr and K. Could eventually consider switch to previous Entresto if BMET remains stable and could consider decreasing amlodipine with uptitration of RAAS inhibitor - Basic disease state pathophysiology, medication indication, mechanism and side effects reviewed at length with patient and he verbalized understanding 2. CAD - MI x 4  (17 stents) - Continue ASA 81 mg daily, Brilinta 90 mg BID, Praluent 75 mg q14 days, Zetia 10 mg daily and Crestor 40 mg daily  3. HTN - Continue hydralazine, isosorbide mononitrate, amlodipine and start losartan as above  4. HLD - Continue Praluent, Zetia, Crestor 5. CKDIII - BMET today stable  - Continue to monitor   Plan: 1) Medication changes: Based on clinical presentation, vital signs and recent labs will start losartan 12.5 mg daily  2) Labs: BMET today and in 2 weeks 3) Follow-up: Dr. Haroldine Laws on 02/23/16   Ruta Hinds. Velva Harman, PharmD, BCPS, CPP Clinical Pharmacist Pager: [phone] Phone: [phone] 01/05/2016 1:31 PM  Agree with above. Much improved on milrinone. Watch renal function closely on losartan.  Mariela Rex,MD 11:30 PM

## 2016-01-05 NOTE — Patient Instructions (Addendum)
It was great to see you today!  Please start losartan 12.5 mg DAILY.   You are scheduled for lab work on Wednesday 01/19/16 at 10:00 am.  You are scheduled to see Dr. Haroldine Laws on Wednesday 02/23/16 at 10:00 am.

## 2016-01-06 ENCOUNTER — Other Ambulatory Visit (HOSPITAL_COMMUNITY): Payer: Self-pay | Admitting: Internal Medicine

## 2016-01-07 ENCOUNTER — Other Ambulatory Visit (HOSPITAL_COMMUNITY): Payer: Self-pay | Admitting: Internal Medicine

## 2016-01-12 DIAGNOSIS — I5023 Acute on chronic systolic (congestive) heart failure: Secondary | ICD-10-CM | POA: Diagnosis not present

## 2016-01-12 DIAGNOSIS — I130 Hypertensive heart and chronic kidney disease with heart failure and stage 1 through stage 4 chronic kidney disease, or unspecified chronic kidney disease: Secondary | ICD-10-CM | POA: Diagnosis not present

## 2016-01-12 DIAGNOSIS — I252 Old myocardial infarction: Secondary | ICD-10-CM | POA: Diagnosis not present

## 2016-01-12 DIAGNOSIS — I255 Ischemic cardiomyopathy: Secondary | ICD-10-CM | POA: Diagnosis not present

## 2016-01-12 DIAGNOSIS — I25110 Atherosclerotic heart disease of native coronary artery with unstable angina pectoris: Secondary | ICD-10-CM | POA: Diagnosis not present

## 2016-01-12 DIAGNOSIS — N183 Chronic kidney disease, stage 3 (moderate): Secondary | ICD-10-CM | POA: Diagnosis not present

## 2016-01-13 ENCOUNTER — Other Ambulatory Visit (HOSPITAL_COMMUNITY): Payer: Self-pay | Admitting: Internal Medicine

## 2016-01-19 ENCOUNTER — Ambulatory Visit (HOSPITAL_COMMUNITY)
Admission: RE | Admit: 2016-01-19 | Discharge: 2016-01-19 | Disposition: A | Discharge status: Home / Self Care | Payer: Medicare Other | Source: Ambulatory Visit | Attending: Internal Medicine | Admitting: Internal Medicine

## 2016-01-19 DIAGNOSIS — I5022 Chronic systolic (congestive) heart failure: Secondary | ICD-10-CM | POA: Diagnosis not present

## 2016-01-19 DIAGNOSIS — I130 Hypertensive heart and chronic kidney disease with heart failure and stage 1 through stage 4 chronic kidney disease, or unspecified chronic kidney disease: Secondary | ICD-10-CM | POA: Diagnosis not present

## 2016-01-19 DIAGNOSIS — I5023 Acute on chronic systolic (congestive) heart failure: Secondary | ICD-10-CM | POA: Diagnosis not present

## 2016-01-19 DIAGNOSIS — I252 Old myocardial infarction: Secondary | ICD-10-CM | POA: Diagnosis not present

## 2016-01-19 DIAGNOSIS — N183 Chronic kidney disease, stage 3 (moderate): Secondary | ICD-10-CM | POA: Diagnosis not present

## 2016-01-19 DIAGNOSIS — I25110 Atherosclerotic heart disease of native coronary artery with unstable angina pectoris: Secondary | ICD-10-CM | POA: Diagnosis not present

## 2016-01-19 DIAGNOSIS — I255 Ischemic cardiomyopathy: Secondary | ICD-10-CM | POA: Diagnosis not present

## 2016-01-19 LAB — BASIC METABOLIC PANEL
ANION GAP: 9 (ref 5–15)
BUN: 27 mg/dL — ABNORMAL HIGH (ref 6–20)
CALCIUM: 9.5 mg/dL (ref 8.9–10.3)
CHLORIDE: 105 mmol/L (ref 101–111)
CO2: 26 mmol/L (ref 22–32)
Creatinine, Ser: 2.25 mg/dL — ABNORMAL HIGH (ref 0.61–1.24)
GFR calc non Af Amer: 32 mL/min — ABNORMAL LOW (ref >60)
GFR, EST AFRICAN AMERICAN: 37 mL/min — AB (ref >60)
GLUCOSE: 163 mg/dL — AB (ref 65–99)
POTASSIUM: 4.1 mmol/L (ref 3.5–5.1)
Sodium: 140 mmol/L (ref 135–145)

## 2016-01-25 ENCOUNTER — Other Ambulatory Visit (HOSPITAL_COMMUNITY): Payer: Self-pay | Admitting: Internal Medicine

## 2016-01-26 DIAGNOSIS — I5023 Acute on chronic systolic (congestive) heart failure: Secondary | ICD-10-CM | POA: Diagnosis not present

## 2016-01-26 DIAGNOSIS — I252 Old myocardial infarction: Secondary | ICD-10-CM | POA: Diagnosis not present

## 2016-01-26 DIAGNOSIS — N183 Chronic kidney disease, stage 3 (moderate): Secondary | ICD-10-CM | POA: Diagnosis not present

## 2016-01-26 DIAGNOSIS — I130 Hypertensive heart and chronic kidney disease with heart failure and stage 1 through stage 4 chronic kidney disease, or unspecified chronic kidney disease: Secondary | ICD-10-CM | POA: Diagnosis not present

## 2016-01-26 DIAGNOSIS — I25110 Atherosclerotic heart disease of native coronary artery with unstable angina pectoris: Secondary | ICD-10-CM | POA: Diagnosis not present

## 2016-01-26 DIAGNOSIS — I255 Ischemic cardiomyopathy: Secondary | ICD-10-CM | POA: Diagnosis not present

## 2016-01-27 ENCOUNTER — Ambulatory Visit (INDEPENDENT_AMBULATORY_CARE_PROVIDER_SITE_OTHER): Payer: Medicare Other | Admitting: *Deleted

## 2016-01-27 DIAGNOSIS — I255 Ischemic cardiomyopathy: Secondary | ICD-10-CM | POA: Diagnosis not present

## 2016-01-27 NOTE — Progress Notes (Signed)
Remote ICD transmission.   

## 2016-01-28 ENCOUNTER — Encounter: Payer: Self-pay | Admitting: Cardiology

## 2016-02-03 DIAGNOSIS — I255 Ischemic cardiomyopathy: Secondary | ICD-10-CM | POA: Diagnosis not present

## 2016-02-03 DIAGNOSIS — Z0189 Encounter for other specified special examinations: Secondary | ICD-10-CM | POA: Diagnosis not present

## 2016-02-03 DIAGNOSIS — I252 Old myocardial infarction: Secondary | ICD-10-CM | POA: Diagnosis not present

## 2016-02-03 DIAGNOSIS — N183 Chronic kidney disease, stage 3 (moderate): Secondary | ICD-10-CM | POA: Diagnosis not present

## 2016-02-03 DIAGNOSIS — I5023 Acute on chronic systolic (congestive) heart failure: Secondary | ICD-10-CM | POA: Diagnosis not present

## 2016-02-03 DIAGNOSIS — I130 Hypertensive heart and chronic kidney disease with heart failure and stage 1 through stage 4 chronic kidney disease, or unspecified chronic kidney disease: Secondary | ICD-10-CM | POA: Diagnosis not present

## 2016-02-03 DIAGNOSIS — I25110 Atherosclerotic heart disease of native coronary artery with unstable angina pectoris: Secondary | ICD-10-CM | POA: Diagnosis not present

## 2016-02-06 ENCOUNTER — Other Ambulatory Visit (HOSPITAL_COMMUNITY): Payer: Self-pay | Admitting: Adult Health

## 2016-02-09 DIAGNOSIS — I255 Ischemic cardiomyopathy: Secondary | ICD-10-CM | POA: Diagnosis not present

## 2016-02-09 DIAGNOSIS — I5023 Acute on chronic systolic (congestive) heart failure: Secondary | ICD-10-CM | POA: Diagnosis not present

## 2016-02-09 DIAGNOSIS — I130 Hypertensive heart and chronic kidney disease with heart failure and stage 1 through stage 4 chronic kidney disease, or unspecified chronic kidney disease: Secondary | ICD-10-CM | POA: Diagnosis not present

## 2016-02-09 DIAGNOSIS — I25110 Atherosclerotic heart disease of native coronary artery with unstable angina pectoris: Secondary | ICD-10-CM | POA: Diagnosis not present

## 2016-02-09 DIAGNOSIS — N183 Chronic kidney disease, stage 3 (moderate): Secondary | ICD-10-CM | POA: Diagnosis not present

## 2016-02-09 DIAGNOSIS — I252 Old myocardial infarction: Secondary | ICD-10-CM | POA: Diagnosis not present

## 2016-02-10 LAB — CUP PACEART REMOTE DEVICE CHECK
Battery Remaining Longevity: 37 mo
Battery Remaining Percentage: 44 %
Battery Voltage: 2.9 V
Brady Statistic AP VS Percent: 1 %
Brady Statistic RA Percent Paced: 1 %
Brady Statistic RV Percent Paced: 1 %
HighPow Impedance: 55 Ohm
Implantable Lead Implant Date: 20121107
Implantable Lead Location: 753860
Implantable Lead Model: 7000
Implantable Pulse Generator Implant Date: 20121107
Lead Channel Impedance Value: 400 Ohm
Lead Channel Pacing Threshold Pulse Width: 0.5 ms
Lead Channel Sensing Intrinsic Amplitude: 11.5 mV
Lead Channel Setting Pacing Amplitude: 2 V
Lead Channel Setting Pacing Amplitude: 2.5 V
Lead Channel Setting Sensing Sensitivity: 0.5 mV
MDC IDC LEAD IMPLANT DT: 20121107
MDC IDC LEAD LOCATION: 753859
MDC IDC MSMT LEADCHNL RA IMPEDANCE VALUE: 300 Ohm
MDC IDC MSMT LEADCHNL RA PACING THRESHOLD AMPLITUDE: 0.75 V
MDC IDC MSMT LEADCHNL RA SENSING INTR AMPL: 4 mV
MDC IDC MSMT LEADCHNL RV PACING THRESHOLD AMPLITUDE: 1.25 V
MDC IDC MSMT LEADCHNL RV PACING THRESHOLD PULSEWIDTH: 0.7 ms
MDC IDC PG SERIAL: 1033795
MDC IDC SESS DTM: 20180111070018
MDC IDC SET LEADCHNL RV PACING PULSEWIDTH: 0.7 ms
MDC IDC STAT BRADY AP VP PERCENT: 1 %
MDC IDC STAT BRADY AS VP PERCENT: 1 %
MDC IDC STAT BRADY AS VS PERCENT: 98 %

## 2016-02-14 ENCOUNTER — Other Ambulatory Visit (HOSPITAL_COMMUNITY): Payer: Self-pay | Admitting: Adult Health

## 2016-02-16 DIAGNOSIS — I255 Ischemic cardiomyopathy: Secondary | ICD-10-CM | POA: Diagnosis not present

## 2016-02-16 DIAGNOSIS — N183 Chronic kidney disease, stage 3 (moderate): Secondary | ICD-10-CM | POA: Diagnosis not present

## 2016-02-16 DIAGNOSIS — I5023 Acute on chronic systolic (congestive) heart failure: Secondary | ICD-10-CM | POA: Diagnosis not present

## 2016-02-16 DIAGNOSIS — I25110 Atherosclerotic heart disease of native coronary artery with unstable angina pectoris: Secondary | ICD-10-CM | POA: Diagnosis not present

## 2016-02-16 DIAGNOSIS — I252 Old myocardial infarction: Secondary | ICD-10-CM | POA: Diagnosis not present

## 2016-02-16 DIAGNOSIS — I130 Hypertensive heart and chronic kidney disease with heart failure and stage 1 through stage 4 chronic kidney disease, or unspecified chronic kidney disease: Secondary | ICD-10-CM | POA: Diagnosis not present

## 2016-02-18 ENCOUNTER — Other Ambulatory Visit (HOSPITAL_COMMUNITY): Payer: Self-pay | Admitting: Internal Medicine

## 2016-02-23 ENCOUNTER — Telehealth (HOSPITAL_COMMUNITY): Payer: Self-pay | Admitting: Cardiology

## 2016-02-23 ENCOUNTER — Encounter: Payer: Self-pay | Admitting: Internal Medicine

## 2016-02-23 ENCOUNTER — Ambulatory Visit (HOSPITAL_COMMUNITY)
Admission: RE | Admit: 2016-02-23 | Discharge: 2016-02-23 | Disposition: A | Discharge status: Home / Self Care | Payer: Medicare Other | Source: Ambulatory Visit | Attending: Internal Medicine | Admitting: Internal Medicine

## 2016-02-23 VITALS — BP 142/100 | HR 102 | Wt 154.8 lb

## 2016-02-23 DIAGNOSIS — I130 Hypertensive heart and chronic kidney disease with heart failure and stage 1 through stage 4 chronic kidney disease, or unspecified chronic kidney disease: Secondary | ICD-10-CM | POA: Diagnosis not present

## 2016-02-23 DIAGNOSIS — I252 Old myocardial infarction: Secondary | ICD-10-CM | POA: Diagnosis not present

## 2016-02-23 DIAGNOSIS — Z955 Presence of coronary angioplasty implant and graft: Secondary | ICD-10-CM | POA: Insufficient documentation

## 2016-02-23 DIAGNOSIS — I5023 Acute on chronic systolic (congestive) heart failure: Secondary | ICD-10-CM | POA: Diagnosis not present

## 2016-02-23 DIAGNOSIS — E785 Hyperlipidemia, unspecified: Secondary | ICD-10-CM | POA: Diagnosis not present

## 2016-02-23 DIAGNOSIS — Z8249 Family history of ischemic heart disease and other diseases of the circulatory system: Secondary | ICD-10-CM | POA: Insufficient documentation

## 2016-02-23 DIAGNOSIS — I255 Ischemic cardiomyopathy: Secondary | ICD-10-CM | POA: Diagnosis not present

## 2016-02-23 DIAGNOSIS — M109 Gout, unspecified: Secondary | ICD-10-CM | POA: Insufficient documentation

## 2016-02-23 DIAGNOSIS — Z95810 Presence of automatic (implantable) cardiac defibrillator: Secondary | ICD-10-CM | POA: Insufficient documentation

## 2016-02-23 DIAGNOSIS — I25118 Atherosclerotic heart disease of native coronary artery with other forms of angina pectoris: Secondary | ICD-10-CM | POA: Diagnosis not present

## 2016-02-23 DIAGNOSIS — I10 Essential (primary) hypertension: Secondary | ICD-10-CM

## 2016-02-23 DIAGNOSIS — Z809 Family history of malignant neoplasm, unspecified: Secondary | ICD-10-CM | POA: Insufficient documentation

## 2016-02-23 DIAGNOSIS — Z7982 Long term (current) use of aspirin: Secondary | ICD-10-CM | POA: Diagnosis not present

## 2016-02-23 DIAGNOSIS — I5042 Chronic combined systolic (congestive) and diastolic (congestive) heart failure: Secondary | ICD-10-CM | POA: Diagnosis not present

## 2016-02-23 DIAGNOSIS — I2510 Atherosclerotic heart disease of native coronary artery without angina pectoris: Secondary | ICD-10-CM | POA: Diagnosis not present

## 2016-02-23 DIAGNOSIS — N183 Chronic kidney disease, stage 3 unspecified: Secondary | ICD-10-CM

## 2016-02-23 DIAGNOSIS — I25110 Atherosclerotic heart disease of native coronary artery with unstable angina pectoris: Secondary | ICD-10-CM | POA: Diagnosis not present

## 2016-02-23 MED ORDER — LOSARTAN POTASSIUM 25 MG PO TABS
25.0000 mg | ORAL_TABLET | Freq: Every day | ORAL | 5 refills | Status: DC
Start: 2016-02-23 — End: 2016-04-07

## 2016-02-23 NOTE — Telephone Encounter (Signed)
Voicemail left to request verbal for continues [facility] services   Verbal given to continue and verbal given as labs are needed this week and a repeat next week, then patient can resume routine schedule.

## 2016-02-23 NOTE — Progress Notes (Signed)
Patient ID: Marvin Martin, male   DOB: Nov 19, 1965, 51 y.o.   MRN: [id number]    Advanced Heart Failure Clinic Note   Primary Care: Dr Laney Pastor Primary Cardiologist: Dr. Haroldine Laws   HPI: [name] is a 51 year old male with history of CAD, ICM, MI X4 (presents with chest burning radiating to L shoulder) with 17 stents, chronic systolic HF with EF 28% s/p St. Jude ICD, HTN, gout, and hyperlipidemia.  He just relocated to [facility] from [place]. His medical care was provided by [facility]. He has struggled with in-stent restenosis. Most recent heart cath was February 2016 with stent placed. He was referred to HF clinic by Dr Laney Pastor to establish HF care.    Admitted May 8th through May 12th, 2017 with chest pain. Had LHC as noted below no intervention.   Admitted June 5th through June 14th, 6381 with A/C systolic CHF. Had repeat R/LHC as below with stable CAD and low output. Started on milrinone and weaned to 0.125 mcg/kg/min for d/c. Coreg, lasix, and spiro stopped.  Entresto dropped from full dose to 24/26 mg, subsequently stopped.   He returns today for regular follow up. Last seen in Pharm-D clinic and low dose losartan added. Stable on milrinone. Has been feeling fine. Wants to get know when he can get off milrinone.  Can walk about a block before he gets SOB, none with ADLs. No orthopnea, CP, or PND.  Has very occasional bendopnea.  Weight at home 145-147.  Denies lightheadedness or dizziness. [place]ses [employer] for transportation.  Doesn't really have any social support.    01/2015 ECHO  EF 20-25% RV normal   Corevue: Volume status stable. Thoracic impedence above threshold.  No VT/VF. No AT/AF  [facility] 06/23/15  Dist RCA-1 lesion, 100% stenosed. The lesion was previously treated with a stent (unknown type).  Dist RCA-2 lesion, 100% stenosed. The lesion was previously treated with a stent (unknown type).  Prox RCA lesion, 70% stenosed. The lesion was previously treated with a  stent (unknown type) and angioplasty.  Mid RCA lesion, 50% stenosed. The lesion was previously treated with a stent (unknown type).  Ost RPDA to RPDA lesion, 75% stenosed. The lesion was previously treated with a drug-eluting stent.  Mid LAD lesion, 70% stenosed.  Ost 2nd Diag lesion, 70% stenosed. Ao = 87/62 (71) LV = 81/9 RA = 2 RV = 20/0/2 PA = 24/12 (16) PCW = 4 Fick cardiac output/index = 3.1/1.8 Thermo CO/CI = 3.0/1.7 PVR = 3.9 WU Ao sat = 97% PA sat = 54%, 55%  R/LHC 05/27/15  Dist RCA-1 lesion, 100% stenosed. The lesion was previously treated with a stent (unknown type).  Dist RCA-2 lesion, 100% stenosed. The lesion was previously treated with a stent (unknown type).  Prox RCA lesion, 60% stenosed. The lesion was previously treated with a stent (unknown type) and angioplasty.  Mid RCA lesion, 40% stenosed. The lesion was previously treated with a stent (unknown type). Findings: RA = 1 RV = 22/1/5 PA = 27/13 (19) PCW = 6 Ao = 120/78 (96) LV = 124/4/13 Fick cardiac output/index = 3.3/1.9 PVR = 4.0 WU SVR = 2335  FA sat = 94% PA sat = 60%. 60%  CPX 01/15/15 FVC 2.67 (65%)    FEV1 2.18 (64%)     FEV1/FVC 81 (100%)     MVV 105 (73%) Peak VO2: 20.3 (56.3% predicted peak VO2) VE/VCO2 slope: 26.8 OUES: 1.52 Peak RER: 1.21  CPX 06/2015  Peak VO2:  19.9 (55.8% predicted peak VO2) VE/VCO2 slope: 31.6 OUES: 1.42 Peak RER: 1.26  Labs: 12/16: K 4.0 Creatinine 1.7 Hgb 13.2 MCV 107 (denies ETOH) 05/28/2015: K 4.4 Creatinine 1.86 08/27/2015: K 4.6 Creatinine 2.6  09/07/2015: K 5.0 Creatinine 2.35 09/16/2015: K 4.3 Creatinine 2.16  09/21/2015: K 4.4 Creatinine 1.71    SH: Separated from his wife. Has 4 children. 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         Father deceased but he is unsure of the cause. He had hyperlipidemia.         Sister: Heart disease  [facility]  [address] [place]  [address] [phone]   Past Medical History:  Diagnosis Date  . Chronic systolic CHF (congestive heart failure) ([place])    a. 01/2015 Echo: EF 20-25%, antlat, inflat AK.  [name] CKD (chronic kidney disease), stage III   . Coronary artery disease    a. s/p MI x 4;  b. Reported h/o 17 stents with recurrent ISR;  c. 02/2014 Cath ([place]): PCI to unknown vessel;  d. 01/2015 MV: EF 18%, large scar, no ischemia; e. 03/2015 PCI: LM nl, LAD nl, D2 patent stent, RI patent stent, LCX patent stents p/m, RCA 95p ISR (PTCA), 24m, 100d into RPL CTO, RPDA 90 (2.5x16 Synergy DES).  . Hyperlipidemia   . Hypertensive heart disease   . Ischemic cardiomyopathy    a. s/p SJM ICD 2007 w/ gen change in 2012; b. 12/2014 CPX Test: mild to mod HF limitation w/ pVO2 20.3 and nl slope;  c. 01/2015 Echo: EF 20-25%.    Current Outpatient Prescriptions  Medication Sig Dispense Refill  . Alirocumab (PRALUENT) 75 MG/ML SOPN Inject 1 pen into the skin every 14 (fourteen) days. 2 pen 11  . allopurinol (ZYLOPRIM) 100 MG tablet Take 100 mg by mouth 2 (two) times daily.     . AMBULATORY NON FORMULARY MEDICATION Inject 0.125 mcg/kg/min into the vein continuous. Medication Name: Milrinone    . amLODipine (NORVASC) 10 MG tablet Take 1 tablet (10 mg total) by mouth daily. 30 tablet 3  . aspirin 81 MG tablet Take 81 mg by mouth daily.    [name] BRILINTA 90 MG TABS tablet TAKE 1 TABLET BY MOUTH TWICE A DAY 60 tablet 1  . cholecalciferol (VITAMIN D) 1000 UNITS tablet Take 1,000 Units by mouth daily.    [name] ezetimibe (ZETIA) 10 MG tablet Take 1 tablet (10 mg total) by mouth daily. 90 tablet 0  . hydrALAZINE (APRESOLINE) 100 MG tablet Take 1 tablet (100 mg total) by mouth 3 (three) times daily. 90 tablet 3  . isosorbide mononitrate (IMDUR) 120 MG 24 hr tablet Take 1 tablet (120 mg total) by mouth daily. 30 tablet 5  . ivabradine (CORLANOR) 5 MG TABS tablet Take 1 tablet (5 mg total) by mouth 2 (two) times daily with a meal. 60 tablet 3  . losartan (COZAAR)  25 MG tablet Take 0.5 tablets (12.5 mg total) by mouth daily. 30 tablet 5  . Magnesium Oxide 200 MG TABS Take 1 tablet (200 mg total) by mouth 2 (two) times daily. 180 tablet 3  . omega-3 acid ethyl esters (LOVAZA) 1 G capsule Take 1 g by mouth daily.     . ondansetron (ZOFRAN) 4 MG tablet Take 4 mg by mouth every 8 (eight) hours as needed for nausea or vomiting. Reported on 05/24/2015    . rosuvastatin (CRESTOR) 40 MG tablet Take 1 tablet (  40 mg total) by mouth daily. 90 tablet 3  . ENTRESTO 24-26 MG TAKE 1 TABLET BY MOUTH 2 (TWO) TIMES DAILY.  6  . nitroGLYCERIN (NITROSTAT) 0.4 MG SL tablet Place 1 tablet (0.4 mg total) under the tongue every 5 (five) minutes as needed for chest pain. Reported on 04/12/2015 (Patient not taking: Reported on 01/05/2016) 60 tablet 1   No current facility-administered medications for this encounter.     No Known Allergies    Social History   Social History  . Marital status: Legally Separated    Spouse name: N/A  . Number of children: N/A  . Years of education: N/A   Occupational History  . Not on file.   Social History Main Topics  . Smoking status: Never Smoker  . Smokeless tobacco: Not on file  . Alcohol use No  . Drug use: No  . Sexual activity: No   Other Topics Concern  . Not on file   Social History Narrative   Moved to [place] from [place] in 2016.  Lives alone in [place].      Family History  Problem Relation Age of Onset  . Cancer Mother     died when pt was 64.  She had a h/o heart dzs as well.  . Hypertension Mother   . Hyperlipidemia Father     Father died when pt was age 53 - he believes that he had a cardiac hx.  [name] CAD Sister     Vitals:   02/23/16 1007  BP: (!) 142/100  Pulse: (!) 102  SpO2: 98%  Weight: 154 lb 12.8 oz (70.2 kg)   Wt Readings from Last 3 Encounters:  02/23/16 154 lb 12.8 oz (70.2 kg)  01/05/16 154 lb (69.9 kg)  12/01/15 151 lb (68.5 kg)     PHYSICAL EXAM: General: Well appearing. NAD.   HEENT: Normal    Neck: supple. JVP 6-7 cm. cm. Carotids 2+ bilat; no bruits. No thyromegaly or nodule noted. JVP 7-8  Cor: PMI laterallydisplaced. Tachy. Regular. +S3.  Lungs: CTAB, normal effort Abdomen: soft, NT, ND, no HSM. No bruits or masses. +BS  Extremities: no cyanosis, clubbing, rash, edema. RUE PICC stable.  . New dressing 11/30/15.   Neuro: alert & oriented x 3, cranial nerves grossly intact. moves all 4 extremities w/o difficulty. Affect pleasant.    ASSESSMENT & PLAN:  1. Chronic Systolic Heart Failure - due to iCM. EF 20-25% on echo 1/17. RV ok. Has ST Jude ICD  -Repeat CPX test with moderate functional limitation due to HF.    NYHA IIIb. Inotrope dependent. Milrinone 0.25 mcg. OK with staying on milrinone for now with marginal kidney status.  - Volume status stable on exam.  Continue lasix as needed.   - Continue Corlanor 5 mg BID.    - No BB for now with low output.  - Increase losartan 25 mg qhs. [facility] to draw labs today. Will have them check next week as well with change.  - Continue hydralazine 100 mg three times a day and imdur 120 mg daily.  - Will repeat Echo to see how EF is doing with med optimization.  - Have talked about consideration for advanced therapies.  Marginal candidate for VAD with lack of social support currently.  May be able to consider for Heart/Kidney.   Dr. Haroldine Laws to discuss with [facility] transplant team.  2. CAD- MI X4 17 stents  Most recent [place] 06/2015 - no intervention continued medical management.  Dist RCA-1 lesion, 100% stenosed. The lesion was previously treated with a stent (unknown type).  Dist RCA-2 lesion, 100% stenosed. The lesion was previously treated with a stent (unknown type).  Prox RCA lesion, 70% stenosed. The lesion was previously treated with a stent (unknown type) and angioplasty.  Mid RCA lesion, 50% stenosed. The lesion was previously treated with a stent (unknown type).  Ost RPDA to RPDA lesion, 75% stenosed. The lesion was previously  treated with a drug-eluting stent.  Mid LAD lesion, 70% stenosed.  Ost 2nd Diag lesion, 70% stenosed. - Continue ASA, statin, ticagrelor.  - No BB with low output and on milrinone.   3. HTN - HF meds as above.  - Continue amlodipine 10 mg daily.  4. Hyperlipidemia - Continue crestor 40 mg daily. No change to current plan.  5. CKD stage III  - Has been stable with recent labs.  Will continue to follow via [place].   Labs per [facility] today and repeat next week with losartan change.  Repeat Echo.    Satira Mccallum Tillery PA-C  10:12 AM   Patient seen and examined with Oda Kilts, PA-C. We discussed all aspects of the encounter. I agree with the assessment and plan as stated above.   He has done remarkably well on milrinone. We had long talk about advanced HF options. Not VAD candidate due to CKD and poor social support. He is interested in transplant but social support and blood type (O+) may also be a limiting factor. Would need dual organ (heart-kidney) most likely. Given his young age, will refer to [facility] to get their impression.   Total time spent 35 minutes. Over half that time spent discussing above.   Bensimhon, Daniel,MD 9:26 PM

## 2016-02-23 NOTE — Patient Instructions (Signed)
INCREASE Losartan to 25 mg ,one tab daily at night  Your physician has requested that you have an echocardiogram. Echocardiography is a painless test that uses sound waves to create images of your heart. It provides your doctor with information about the size and shape of your heart and how well your heart's chambers and valves are working. This procedure takes approximately one hour. There are no restrictions for this procedure.  Your physician recommends that you schedule a follow-up appointment in: 6 weeks with Dr Haroldine Laws

## 2016-02-24 ENCOUNTER — Other Ambulatory Visit (HOSPITAL_COMMUNITY): Payer: Self-pay | Admitting: Cardiology

## 2016-02-24 MED ORDER — TICAGRELOR 90 MG PO TABS: 90.0000 mg | ORAL_TABLET | Freq: Two times a day (BID) | ORAL | 1 refills | Status: DC

## 2016-02-25 ENCOUNTER — Telehealth (HOSPITAL_COMMUNITY): Payer: Self-pay

## 2016-02-25 ENCOUNTER — Other Ambulatory Visit (HOSPITAL_COMMUNITY): Payer: Self-pay | Admitting: Internal Medicine

## 2016-02-25 DIAGNOSIS — I252 Old myocardial infarction: Secondary | ICD-10-CM | POA: Diagnosis not present

## 2016-02-25 DIAGNOSIS — I130 Hypertensive heart and chronic kidney disease with heart failure and stage 1 through stage 4 chronic kidney disease, or unspecified chronic kidney disease: Secondary | ICD-10-CM | POA: Diagnosis not present

## 2016-02-25 DIAGNOSIS — Z5181 Encounter for therapeutic drug level monitoring: Secondary | ICD-10-CM | POA: Diagnosis not present

## 2016-02-25 DIAGNOSIS — I25110 Atherosclerotic heart disease of native coronary artery with unstable angina pectoris: Secondary | ICD-10-CM | POA: Diagnosis not present

## 2016-02-25 DIAGNOSIS — I255 Ischemic cardiomyopathy: Secondary | ICD-10-CM | POA: Diagnosis not present

## 2016-02-25 DIAGNOSIS — I5023 Acute on chronic systolic (congestive) heart failure: Secondary | ICD-10-CM | POA: Diagnosis not present

## 2016-02-25 DIAGNOSIS — N183 Chronic kidney disease, stage 3 (moderate): Secondary | ICD-10-CM | POA: Diagnosis not present

## 2016-02-25 NOTE — Telephone Encounter (Signed)
[facility] RN called to report PICC dressing has had difficulty staying on past few weeks.  Surrounding area is dry and irritated despite rotating different dressing materials. No drainage/bleeding from site noted, patient has no fever or chills and otherwise feels well. PICC has been in since 06/2015, wondering if getting a new PICC on opposite side would be appropriate as PICC is 51 months old. Will forward to Dr. Haroldine Laws to advise.  Renee Pain, RN

## 2016-02-26 DIAGNOSIS — Z955 Presence of coronary angioplasty implant and graft: Secondary | ICD-10-CM | POA: Diagnosis not present

## 2016-02-26 DIAGNOSIS — Z7901 Long term (current) use of anticoagulants: Secondary | ICD-10-CM | POA: Diagnosis not present

## 2016-02-26 DIAGNOSIS — Z950 Presence of cardiac pacemaker: Secondary | ICD-10-CM | POA: Diagnosis not present

## 2016-02-26 DIAGNOSIS — N183 Chronic kidney disease, stage 3 (moderate): Secondary | ICD-10-CM | POA: Diagnosis not present

## 2016-02-26 DIAGNOSIS — E785 Hyperlipidemia, unspecified: Secondary | ICD-10-CM | POA: Diagnosis not present

## 2016-02-26 DIAGNOSIS — I130 Hypertensive heart and chronic kidney disease with heart failure and stage 1 through stage 4 chronic kidney disease, or unspecified chronic kidney disease: Secondary | ICD-10-CM | POA: Diagnosis not present

## 2016-02-26 DIAGNOSIS — I25110 Atherosclerotic heart disease of native coronary artery with unstable angina pectoris: Secondary | ICD-10-CM | POA: Diagnosis not present

## 2016-02-26 DIAGNOSIS — I252 Old myocardial infarction: Secondary | ICD-10-CM | POA: Diagnosis not present

## 2016-02-26 DIAGNOSIS — I255 Ischemic cardiomyopathy: Secondary | ICD-10-CM | POA: Diagnosis not present

## 2016-02-26 DIAGNOSIS — Z452 Encounter for adjustment and management of vascular access device: Secondary | ICD-10-CM | POA: Diagnosis not present

## 2016-02-26 DIAGNOSIS — Z5181 Encounter for therapeutic drug level monitoring: Secondary | ICD-10-CM | POA: Diagnosis not present

## 2016-02-26 DIAGNOSIS — I5023 Acute on chronic systolic (congestive) heart failure: Secondary | ICD-10-CM | POA: Diagnosis not present

## 2016-02-26 NOTE — Telephone Encounter (Signed)
If Marvin Martin feels that is best plan we can certainly do that but I hate to manipulate PICC too much unless absolutely necessary due to risk of infection. Is there anything else we can do to protect the skin?   Amy - ny thoughts?

## 2016-02-28 NOTE — Telephone Encounter (Signed)
Please contact [place] [facility] for suggestion. If no other option can consider moving.   Marvin Martin 1:52 PM

## 2016-03-01 ENCOUNTER — Telehealth (HOSPITAL_COMMUNITY): Payer: Self-pay

## 2016-03-01 DIAGNOSIS — I255 Ischemic cardiomyopathy: Secondary | ICD-10-CM | POA: Diagnosis not present

## 2016-03-01 DIAGNOSIS — I25110 Atherosclerotic heart disease of native coronary artery with unstable angina pectoris: Secondary | ICD-10-CM | POA: Diagnosis not present

## 2016-03-01 DIAGNOSIS — I252 Old myocardial infarction: Secondary | ICD-10-CM | POA: Diagnosis not present

## 2016-03-01 DIAGNOSIS — I5023 Acute on chronic systolic (congestive) heart failure: Secondary | ICD-10-CM | POA: Diagnosis not present

## 2016-03-01 DIAGNOSIS — N183 Chronic kidney disease, stage 3 (moderate): Secondary | ICD-10-CM | POA: Diagnosis not present

## 2016-03-01 DIAGNOSIS — I130 Hypertensive heart and chronic kidney disease with heart failure and stage 1 through stage 4 chronic kidney disease, or unspecified chronic kidney disease: Secondary | ICD-10-CM | POA: Diagnosis not present

## 2016-03-01 NOTE — Telephone Encounter (Signed)
Spoke with Carolynn Sayers regarding issue. Pam states as long as line is working fine (which is the case) PICC replacement is recommended as "last stitch effort". Pam out of town today but will follow up with home health RN regarding this issue and figure out other dressing options first. If other options do not work to safely keep PICC site clean dry and intact, will follow up with CHF clinic office to discuss PICC exchange on other arm.  Renee Pain, RN

## 2016-03-01 NOTE — Telephone Encounter (Signed)
error 

## 2016-03-01 NOTE — Telephone Encounter (Signed)
HHRN Marvin Martin called to report patient completed IV doses of antibiotics at home. Patient requesting to have PICC line removed. Advised will discuss at Monday's apt in CHF clinic. Patient aware and agreeable.  Renee Pain, RN

## 2016-03-08 ENCOUNTER — Ambulatory Visit (HOSPITAL_COMMUNITY): Admission: RE | Admit: 2016-03-08 | Payer: Medicare Other | Source: Ambulatory Visit

## 2016-03-08 DIAGNOSIS — I5023 Acute on chronic systolic (congestive) heart failure: Secondary | ICD-10-CM | POA: Diagnosis not present

## 2016-03-08 DIAGNOSIS — I252 Old myocardial infarction: Secondary | ICD-10-CM | POA: Diagnosis not present

## 2016-03-08 DIAGNOSIS — I25110 Atherosclerotic heart disease of native coronary artery with unstable angina pectoris: Secondary | ICD-10-CM | POA: Diagnosis not present

## 2016-03-08 DIAGNOSIS — I255 Ischemic cardiomyopathy: Secondary | ICD-10-CM | POA: Diagnosis not present

## 2016-03-08 DIAGNOSIS — I130 Hypertensive heart and chronic kidney disease with heart failure and stage 1 through stage 4 chronic kidney disease, or unspecified chronic kidney disease: Secondary | ICD-10-CM | POA: Diagnosis not present

## 2016-03-08 DIAGNOSIS — N183 Chronic kidney disease, stage 3 (moderate): Secondary | ICD-10-CM | POA: Diagnosis not present

## 2016-03-10 ENCOUNTER — Other Ambulatory Visit (HOSPITAL_COMMUNITY): Payer: Self-pay | Admitting: Internal Medicine

## 2016-03-15 DIAGNOSIS — I252 Old myocardial infarction: Secondary | ICD-10-CM | POA: Diagnosis not present

## 2016-03-15 DIAGNOSIS — I130 Hypertensive heart and chronic kidney disease with heart failure and stage 1 through stage 4 chronic kidney disease, or unspecified chronic kidney disease: Secondary | ICD-10-CM | POA: Diagnosis not present

## 2016-03-15 DIAGNOSIS — I255 Ischemic cardiomyopathy: Secondary | ICD-10-CM | POA: Diagnosis not present

## 2016-03-15 DIAGNOSIS — I25110 Atherosclerotic heart disease of native coronary artery with unstable angina pectoris: Secondary | ICD-10-CM | POA: Diagnosis not present

## 2016-03-15 DIAGNOSIS — I5023 Acute on chronic systolic (congestive) heart failure: Secondary | ICD-10-CM | POA: Diagnosis not present

## 2016-03-15 DIAGNOSIS — N183 Chronic kidney disease, stage 3 (moderate): Secondary | ICD-10-CM | POA: Diagnosis not present

## 2016-03-20 ENCOUNTER — Ambulatory Visit (HOSPITAL_COMMUNITY)
Admission: RE | Admit: 2016-03-20 | Discharge: 2016-03-20 | Disposition: A | Discharge status: Home / Self Care | Payer: Medicare Other | Source: Ambulatory Visit | Attending: Family Medicine | Admitting: Family Medicine

## 2016-03-20 DIAGNOSIS — I341 Nonrheumatic mitral (valve) prolapse: Secondary | ICD-10-CM | POA: Insufficient documentation

## 2016-03-20 DIAGNOSIS — I361 Nonrheumatic tricuspid (valve) insufficiency: Secondary | ICD-10-CM | POA: Insufficient documentation

## 2016-03-20 DIAGNOSIS — I371 Nonrheumatic pulmonary valve insufficiency: Secondary | ICD-10-CM | POA: Diagnosis not present

## 2016-03-20 DIAGNOSIS — I517 Cardiomegaly: Secondary | ICD-10-CM | POA: Diagnosis not present

## 2016-03-20 DIAGNOSIS — I501 Left ventricular failure: Secondary | ICD-10-CM | POA: Diagnosis not present

## 2016-03-20 DIAGNOSIS — I255 Ischemic cardiomyopathy: Secondary | ICD-10-CM | POA: Diagnosis not present

## 2016-03-20 LAB — ECHOCARDIOGRAM COMPLETE
AVLVOTPG: 3 mmHg
CHL CUP MV DEC (S): 92
CHL CUP REG VEL DIAS: 163 cm/s
CHL CUP TV REG PEAK VELOCITY: 229 cm/s
E decel time: 92 msec
E/e' ratio: 16.82
FS: 8 % — AB (ref 28–44)
IVS/LV PW RATIO, ED: 1.12
LA ID, A-P, ES: 35 mm
LA vol index: 25.7 mL/m2
LA vol: 46.6 mL
LADIAMINDEX: 1.93 cm/m2
LAVOLA4C: 49.6 mL
LEFT ATRIUM END SYS DIAM: 35 mm
LV E/e'average: 16.82
LV PW d: 10 mm — AB (ref 0.6–1.1)
LV TDI E'LATERAL: 7.67
LV TDI E'MEDIAL: 7.83
LV dias vol index: 171 mL/m2
LV dias vol: 310 mL — AB (ref 62–150)
LV sys vol index: 131 mL/m2
LV sys vol: 237 mL — AB (ref 21–61)
LVEEMED: 16.82
LVELAT: 7.67 cm/s
LVOT VTI: 13.1 cm
LVOT area: 2.84 cm2
LVOT peak vel: 82.6 cm/s
LVOTD: 19 mm
LVOTSV: 37 mL
Lateral S' vel: 11.2 cm/s
MVPG: 7 mmHg
MVPKEVEL: 129 m/s
PV Reg grad dias: 11 mmHg
RV sys press: 24 mmHg
Simpson's disk: 24
Stroke v: 73 ml
TAPSE: 17 mm
TRMAXVEL: 229 cm/s

## 2016-03-20 NOTE — Progress Notes (Signed)
  Echocardiogram 2D Echocardiogram has been performed.  Darlina Sicilian M 03/20/2016, 9:46 AM

## 2016-03-22 DIAGNOSIS — N183 Chronic kidney disease, stage 3 (moderate): Secondary | ICD-10-CM | POA: Diagnosis not present

## 2016-03-22 DIAGNOSIS — I255 Ischemic cardiomyopathy: Secondary | ICD-10-CM | POA: Diagnosis not present

## 2016-03-22 DIAGNOSIS — I130 Hypertensive heart and chronic kidney disease with heart failure and stage 1 through stage 4 chronic kidney disease, or unspecified chronic kidney disease: Secondary | ICD-10-CM | POA: Diagnosis not present

## 2016-03-22 DIAGNOSIS — I252 Old myocardial infarction: Secondary | ICD-10-CM | POA: Diagnosis not present

## 2016-03-22 DIAGNOSIS — I25110 Atherosclerotic heart disease of native coronary artery with unstable angina pectoris: Secondary | ICD-10-CM | POA: Diagnosis not present

## 2016-03-22 DIAGNOSIS — I5023 Acute on chronic systolic (congestive) heart failure: Secondary | ICD-10-CM | POA: Diagnosis not present

## 2016-03-24 ENCOUNTER — Other Ambulatory Visit: Payer: Self-pay | Admitting: Internal Medicine

## 2016-03-28 ENCOUNTER — Other Ambulatory Visit: Payer: Self-pay | Admitting: Family Medicine

## 2016-03-29 ENCOUNTER — Other Ambulatory Visit (HOSPITAL_COMMUNITY): Payer: Self-pay | Admitting: Internal Medicine

## 2016-03-29 DIAGNOSIS — N183 Chronic kidney disease, stage 3 (moderate): Secondary | ICD-10-CM | POA: Diagnosis not present

## 2016-03-29 DIAGNOSIS — I25110 Atherosclerotic heart disease of native coronary artery with unstable angina pectoris: Secondary | ICD-10-CM | POA: Diagnosis not present

## 2016-03-29 DIAGNOSIS — I252 Old myocardial infarction: Secondary | ICD-10-CM | POA: Diagnosis not present

## 2016-03-29 DIAGNOSIS — I130 Hypertensive heart and chronic kidney disease with heart failure and stage 1 through stage 4 chronic kidney disease, or unspecified chronic kidney disease: Secondary | ICD-10-CM | POA: Diagnosis not present

## 2016-03-29 DIAGNOSIS — I5023 Acute on chronic systolic (congestive) heart failure: Secondary | ICD-10-CM | POA: Diagnosis not present

## 2016-03-29 DIAGNOSIS — I255 Ischemic cardiomyopathy: Secondary | ICD-10-CM | POA: Diagnosis not present

## 2016-04-05 ENCOUNTER — Encounter (HOSPITAL_COMMUNITY): Payer: Medicare Other | Admitting: Internal Medicine

## 2016-04-05 DIAGNOSIS — I255 Ischemic cardiomyopathy: Secondary | ICD-10-CM | POA: Diagnosis not present

## 2016-04-05 DIAGNOSIS — I252 Old myocardial infarction: Secondary | ICD-10-CM | POA: Diagnosis not present

## 2016-04-05 DIAGNOSIS — I130 Hypertensive heart and chronic kidney disease with heart failure and stage 1 through stage 4 chronic kidney disease, or unspecified chronic kidney disease: Secondary | ICD-10-CM | POA: Diagnosis not present

## 2016-04-05 DIAGNOSIS — I5023 Acute on chronic systolic (congestive) heart failure: Secondary | ICD-10-CM | POA: Diagnosis not present

## 2016-04-05 DIAGNOSIS — N183 Chronic kidney disease, stage 3 (moderate): Secondary | ICD-10-CM | POA: Diagnosis not present

## 2016-04-05 DIAGNOSIS — I25110 Atherosclerotic heart disease of native coronary artery with unstable angina pectoris: Secondary | ICD-10-CM | POA: Diagnosis not present

## 2016-04-07 ENCOUNTER — Ambulatory Visit (HOSPITAL_COMMUNITY)
Admission: RE | Admit: 2016-04-07 | Discharge: 2016-04-07 | Disposition: A | Discharge status: Home / Self Care | Payer: Medicare Other | Source: Ambulatory Visit | Attending: Internal Medicine | Admitting: Internal Medicine

## 2016-04-07 VITALS — BP 144/100 | HR 102 | Wt 154.0 lb

## 2016-04-07 DIAGNOSIS — Z95810 Presence of automatic (implantable) cardiac defibrillator: Secondary | ICD-10-CM | POA: Diagnosis not present

## 2016-04-07 DIAGNOSIS — Z8249 Family history of ischemic heart disease and other diseases of the circulatory system: Secondary | ICD-10-CM | POA: Insufficient documentation

## 2016-04-07 DIAGNOSIS — Z809 Family history of malignant neoplasm, unspecified: Secondary | ICD-10-CM | POA: Diagnosis not present

## 2016-04-07 DIAGNOSIS — I252 Old myocardial infarction: Secondary | ICD-10-CM | POA: Diagnosis not present

## 2016-04-07 DIAGNOSIS — E785 Hyperlipidemia, unspecified: Secondary | ICD-10-CM | POA: Diagnosis not present

## 2016-04-07 DIAGNOSIS — I130 Hypertensive heart and chronic kidney disease with heart failure and stage 1 through stage 4 chronic kidney disease, or unspecified chronic kidney disease: Secondary | ICD-10-CM | POA: Insufficient documentation

## 2016-04-07 DIAGNOSIS — M109 Gout, unspecified: Secondary | ICD-10-CM | POA: Insufficient documentation

## 2016-04-07 DIAGNOSIS — I255 Ischemic cardiomyopathy: Secondary | ICD-10-CM | POA: Diagnosis not present

## 2016-04-07 DIAGNOSIS — Z7982 Long term (current) use of aspirin: Secondary | ICD-10-CM | POA: Diagnosis not present

## 2016-04-07 DIAGNOSIS — I2510 Atherosclerotic heart disease of native coronary artery without angina pectoris: Secondary | ICD-10-CM | POA: Diagnosis not present

## 2016-04-07 DIAGNOSIS — Z955 Presence of coronary angioplasty implant and graft: Secondary | ICD-10-CM | POA: Insufficient documentation

## 2016-04-07 DIAGNOSIS — I5023 Acute on chronic systolic (congestive) heart failure: Secondary | ICD-10-CM | POA: Diagnosis not present

## 2016-04-07 DIAGNOSIS — I25118 Atherosclerotic heart disease of native coronary artery with other forms of angina pectoris: Secondary | ICD-10-CM | POA: Diagnosis not present

## 2016-04-07 DIAGNOSIS — I5042 Chronic combined systolic (congestive) and diastolic (congestive) heart failure: Secondary | ICD-10-CM

## 2016-04-07 DIAGNOSIS — N183 Chronic kidney disease, stage 3 unspecified: Secondary | ICD-10-CM

## 2016-04-07 DIAGNOSIS — I10 Essential (primary) hypertension: Secondary | ICD-10-CM | POA: Diagnosis not present

## 2016-04-07 MED ORDER — ENTRESTO 24-26 MG PO TABS: 1.0000 | ORAL_TABLET | Freq: Two times a day (BID) | ORAL | 6 refills | Status: DC

## 2016-04-07 NOTE — Addendum Note (Signed)
Encounter addended by: Scarlette Calico, RN on: 04/07/2016 11:04 AM<BR>    Actions taken: Medication long-term status modified, Order list changed, Sign clinical note

## 2016-04-07 NOTE — Patient Instructions (Signed)
Stop Losartan  Start Entresto 24/26 mg Twice daily   Labs next week with [facility] physician recommends that you schedule a follow-up appointment in: 1 month

## 2016-04-07 NOTE — Progress Notes (Signed)
Patient ID: Marvin Martin, male   DOB: Feb 10, 1965, 51 y.o.   MRN: [id number]    Advanced Heart Failure Clinic Note   Primary Care: Dr Laney Pastor Primary Cardiologist: Dr. Haroldine Laws   HPI: Marvin Martin is a 51 year old male with history of CAD, ICM, MI X4 (presents with chest burning radiating to L shoulder) with 17 stents, chronic systolic HF with EF 99% s/p St. Jude ICD, HTN, gout, and hyperlipidemia.  He just relocated to [facility] from [place]. His medical care was provided by [facility]. He has struggled with in-stent restenosis. Most recent heart cath was February 2016 with stent placed. He was referred to HF clinic by Dr Laney Pastor to establish HF care.    Admitted May 8th through May 12th, 2017 with chest pain. Had LHC as noted below no intervention.   Admitted June 5th through June 14th, 8338 with A/C systolic CHF. Had repeat R/LHC as below with stable CAD and low output. Started on milrinone and weaned to 0.125 mcg/kg/min for d/c. Coreg, lasix, and spiro stopped.  Entresto dropped from full dose to 24/26 mg, subsequently stopped.   He returns today for regular follow up. Feels pretty good. Had echo 03/20/16 and EF 15-20%. On milrinone 0.25. Does all ADLs without any problem. Hasn't tried to do more due to cold weather. No edema, orthopnea or PND. Weight stable  Weight at home 145-148. Says he is complaint with all med. No problems with PICC line.    01/2015 ECHO  EF 20-25% RV normal  03/21/15: EF 15-20% RV ok Personally reviewed  ReDS 41%  Corevue: Volume status up slightly . No VT/VF. No AT/AF  [facility] 06/23/15  Dist RCA-1 lesion, 100% stenosed. The lesion was previously treated with a stent (unknown type).  Dist RCA-2 lesion, 100% stenosed. The lesion was previously treated with a stent (unknown type).  Prox RCA lesion, 70% stenosed. The lesion was previously treated with a stent (unknown type) and angioplasty.  Mid RCA lesion, 50% stenosed. The lesion was previously treated  with a stent (unknown type).  Ost RPDA to RPDA lesion, 75% stenosed. The lesion was previously treated with a drug-eluting stent.  Mid LAD lesion, 70% stenosed.  Ost 2nd Diag lesion, 70% stenosed. Ao = 87/62 (71) LV = 81/9 RA = 2 RV = 20/0/2 PA = 24/12 (16) PCW = 4 Fick cardiac output/index = 3.1/1.8 Thermo CO/CI = 3.0/1.7 PVR = 3.9 WU Ao sat = 97% PA sat = 54%, 55%  R/LHC 05/27/15  Dist RCA-1 lesion, 100% stenosed. The lesion was previously treated with a stent (unknown type).  Dist RCA-2 lesion, 100% stenosed. The lesion was previously treated with a stent (unknown type).  Prox RCA lesion, 60% stenosed. The lesion was previously treated with a stent (unknown type) and angioplasty.  Mid RCA lesion, 40% stenosed. The lesion was previously treated with a stent (unknown type). Findings: RA = 1 RV = 22/1/5 PA = 27/13 (19) PCW = 6 Ao = 120/78 (96) LV = 124/4/13 Fick cardiac output/index = 3.3/1.9 PVR = 4.0 WU SVR = 2335  FA sat = 94% PA sat = 60%. 60%  CPX 01/15/15 FVC 2.67 (65%)    FEV1 2.18 (64%)     FEV1/FVC 81 (100%)     MVV 105 (73%) Peak VO2: 20.3 (56.3% predicted peak VO2) VE/VCO2 slope: 26.8 OUES: 1.52 Peak RER: 1.21  CPX 06/2015  Peak VO2: 19.9 (55.8% predicted peak VO2) VE/VCO2 slope: 31.6 OUES: 1.42 Peak RER: 1.26  Labs: 12/16: K 4.0 Creatinine 1.7 Hgb 13.2 MCV 107 (denies ETOH) 05/28/2015: K 4.4 Creatinine 1.86 08/27/2015: K 4.6 Creatinine 2.6  09/07/2015: K 5.0 Creatinine 2.35 09/16/2015: K 4.3 Creatinine 2.16  09/21/2015: K 4.4 Creatinine 1.71    SH: Separated from his wife. Has 4 children. 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         Father deceased but he is unsure of the cause. He had hyperlipidemia.         Sister: Heart disease  [facility]  [address] [place] [address] [phone]   Past Medical History:  Diagnosis Date  . Chronic systolic CHF (congestive heart  failure) ([place])    a. 01/2015 Echo: EF 20-25%, antlat, inflat AK.  [name] CKD (chronic kidney disease), stage III   . Coronary artery disease    a. s/p MI x 4;  b. Reported h/o 17 stents with recurrent ISR;  c. 02/2014 Cath ([place]): PCI to unknown vessel;  d. 01/2015 MV: EF 18%, large scar, no ischemia; e. 03/2015 PCI: LM nl, LAD nl, D2 patent stent, RI patent stent, LCX patent stents p/m, RCA 95p ISR (PTCA), 68m, 100d into RPL CTO, RPDA 90 (2.5x16 Synergy DES).  . Hyperlipidemia   . Hypertensive heart disease   . Ischemic cardiomyopathy    a. s/p SJM ICD 2007 w/ gen change in 2012; b. 12/2014 CPX Test: mild to mod HF limitation w/ pVO2 20.3 and nl slope;  c. 01/2015 Echo: EF 20-25%.    Current Outpatient Prescriptions  Medication Sig Dispense Refill  . allopurinol (ZYLOPRIM) 100 MG tablet Take 100 mg by mouth 2 (two) times daily.     . AMBULATORY NON FORMULARY MEDICATION Inject 0.125 mcg/kg/min into the vein continuous. Medication Name: Milrinone    . amLODipine (NORVASC) 10 MG tablet Take 1 tablet (10 mg total) by mouth daily. 30 tablet 3  . aspirin 81 MG tablet Take 81 mg by mouth daily.    . cholecalciferol (VITAMIN D) 1000 UNITS tablet Take 1,000 Units by mouth daily.    [name] ENTRESTO 24-26 MG TAKE 1 TABLET BY MOUTH 2 (TWO) TIMES DAILY.  6  . ezetimibe (ZETIA) 10 MG tablet TAKE 1 TABLET BY MOUTH EVERY DAY 90 tablet 0  . hydrALAZINE (APRESOLINE) 100 MG tablet Take 1 tablet (100 mg total) by mouth 3 (three) times daily. 90 tablet 3  . isosorbide mononitrate (IMDUR) 120 MG 24 hr tablet Take 1 tablet (120 mg total) by mouth daily. 30 tablet 5  . ivabradine (CORLANOR) 5 MG TABS tablet Take 1 tablet (5 mg total) by mouth 2 (two) times daily with a meal. 60 tablet 3  . losartan (COZAAR) 25 MG tablet Take 1 tablet (25 mg total) by mouth daily. 30 tablet 5  . Magnesium Oxide 200 MG TABS Take 1 tablet (200 mg total) by mouth 2 (two) times daily. 180 tablet 3  . nitroGLYCERIN (NITROSTAT) 0.4 MG SL tablet Place 1  tablet (0.4 mg total) under the tongue every 5 (five) minutes as needed for chest pain. Reported on 04/12/2015 60 tablet 1  . omega-3 acid ethyl esters (LOVAZA) 1 G capsule Take 1 g by mouth daily.     . ondansetron (ZOFRAN) 4 MG tablet Take 4 mg by mouth every 8 (eight) hours as needed for nausea or vomiting. Reported on 05/24/2015    . PRALUENT 75 MG/ML SOPN INJECT 75MG  (1 PEN) SUBCUTANEOUSLY EVERY 14 DAYS 2 pen  11  . rosuvastatin (CRESTOR) 40 MG tablet Take 1 tablet (40 mg total) by mouth daily. 90 tablet 3  . ticagrelor (BRILINTA) 90 MG TABS tablet Take 1 tablet (90 mg total) by mouth 2 (two) times daily. 180 tablet 1   No current facility-administered medications for this encounter.     No Known Allergies    Social History   Social History  . Marital status: Legally Separated    Spouse name: N/A  . Number of children: N/A  . Years of education: N/A   Occupational History  . Not on file.   Social History Main Topics  . Smoking status: Never Smoker  . Smokeless tobacco: Not on file  . Alcohol use No  . Drug use: No  . Sexual activity: No   Other Topics Concern  . Not on file   Social History Narrative   Moved to [place] from [place] in 2016.  Lives alone in [place].      Family History  Problem Relation Age of Onset  . Cancer Mother     died when pt was 62.  She had a h/o heart dzs as well.  . Hypertension Mother   . Hyperlipidemia Father     Father died when pt was age 38 - he believes that he had a cardiac hx.  [name] CAD Sister     Vitals:   04/07/16 0934  BP: (!) 144/100  Pulse: (!) 102  SpO2: 99%  Weight: 154 lb (69.9 kg)   Wt Readings from Last 3 Encounters:  04/07/16 154 lb (69.9 kg)  02/23/16 154 lb 12.8 oz (70.2 kg)  01/05/16 154 lb (69.9 kg)     PHYSICAL EXAM: General: Well appearing. NAD.   HEENT: Normal anicteric Neck: supple. JVP 6-7. Carotids 2+ bilat; no bruits. No thyromegaly or nodule noted. J Cor: PMI laterallydisplaced. Tachy. Regular + s3. 2/6  TR Lungs: CTAB, normal effort no wheeze Abdomen: soft, NT, nondistended no HSM. No bruits or masses. +BS  Extremities: no cyanosis, clubbing, rash, edema. RUE PICC stable.  No erythema or ras  Neuro: alert & oriented x 3, cranial nerves grossly intact. moves all 4 extremities w/o difficulty. Affect pleasant.    ASSESSMENT & PLAN:  1. Chronic Systolic Heart Failure - due to iCM. EF 20-25% on echo 1/17. RV ok. Has ST Jude ICD - Echo 02/21/16 EF 15-20%. Rv ok. Reviewed personally.   - Overall doing well on milrinone. NYHA II-III. Has failed wean in past. - Volume status not too bad on exanm but up on bother ReDS and Corevue. [facility] to draw labs.  - Will switch losartan to Entresto 24/26 to get some help with diuresis and HTN - Recent repeat CPX test with moderate functional limitation due to HF.    - Continue Corlanor 5 mg BID.    - No BB for now with low output.  - Continue hydralazine 100 mg three times a day and imdur 120 mg daily.  - Have talked about consideration for advanced therapies.  Marginal candidate for VAD with lack of social support currently.  May be able to consider for Heart/Kidney but given social support will not pursue at this time,  2. CAD- MI X4 17 stents  Most recent [place] 06/2015 - no intervention continued medical management.    Dist RCA-1 lesion, 100% stenosed. The lesion was previously treated with a stent (unknown type).  Dist RCA-2 lesion, 100% stenosed. The lesion was previously treated with a stent (unknown type).  Prox RCA lesion, 70% stenosed. The lesion was previously treated with a stent (unknown type) and angioplasty.  Mid RCA lesion, 50% stenosed. The lesion was previously treated with a stent (unknown type).  Ost RPDA to RPDA lesion, 75% stenosed. The lesion was previously treated with a drug-eluting stent.  Mid LAD lesion, 70% stenosed.  Ost 2nd Diag lesion, 70% stenosed. - Denies CP. No ischemic symptoms - Continue ASA, statin, ticagrelor.  - No BB  with low output and on milrinone.   3. HTN - HF meds as above.  - BP still up. Switch losartan to Entresto 24/26 4. Hyperlipidemia - Followed by lipid clinic. Continue Crestor, Zetia and Pralulent 5. CKD stage III  - Has been stable with recent labs.  Will continue to follow via [place].       Glori Bickers MD 10:50 AM

## 2016-04-12 DIAGNOSIS — N183 Chronic kidney disease, stage 3 (moderate): Secondary | ICD-10-CM | POA: Diagnosis not present

## 2016-04-12 DIAGNOSIS — I252 Old myocardial infarction: Secondary | ICD-10-CM | POA: Diagnosis not present

## 2016-04-12 DIAGNOSIS — I130 Hypertensive heart and chronic kidney disease with heart failure and stage 1 through stage 4 chronic kidney disease, or unspecified chronic kidney disease: Secondary | ICD-10-CM | POA: Diagnosis not present

## 2016-04-12 DIAGNOSIS — I255 Ischemic cardiomyopathy: Secondary | ICD-10-CM | POA: Diagnosis not present

## 2016-04-12 DIAGNOSIS — I5023 Acute on chronic systolic (congestive) heart failure: Secondary | ICD-10-CM | POA: Diagnosis not present

## 2016-04-12 DIAGNOSIS — I25110 Atherosclerotic heart disease of native coronary artery with unstable angina pectoris: Secondary | ICD-10-CM | POA: Diagnosis not present

## 2016-04-14 ENCOUNTER — Other Ambulatory Visit (HOSPITAL_COMMUNITY): Payer: Self-pay | Admitting: Internal Medicine

## 2016-04-14 ENCOUNTER — Other Ambulatory Visit (HOSPITAL_COMMUNITY): Payer: Self-pay | Admitting: Cardiology

## 2016-04-14 MED ORDER — HYDRALAZINE HCL 100 MG PO TABS: 100.0000 mg | ORAL_TABLET | Freq: Three times a day (TID) | ORAL | 3 refills | Status: DC

## 2016-04-19 DIAGNOSIS — I255 Ischemic cardiomyopathy: Secondary | ICD-10-CM | POA: Diagnosis not present

## 2016-04-19 DIAGNOSIS — I5023 Acute on chronic systolic (congestive) heart failure: Secondary | ICD-10-CM | POA: Diagnosis not present

## 2016-04-19 DIAGNOSIS — I25110 Atherosclerotic heart disease of native coronary artery with unstable angina pectoris: Secondary | ICD-10-CM | POA: Diagnosis not present

## 2016-04-19 DIAGNOSIS — I130 Hypertensive heart and chronic kidney disease with heart failure and stage 1 through stage 4 chronic kidney disease, or unspecified chronic kidney disease: Secondary | ICD-10-CM | POA: Diagnosis not present

## 2016-04-19 DIAGNOSIS — N183 Chronic kidney disease, stage 3 (moderate): Secondary | ICD-10-CM | POA: Diagnosis not present

## 2016-04-19 DIAGNOSIS — I252 Old myocardial infarction: Secondary | ICD-10-CM | POA: Diagnosis not present

## 2016-04-24 ENCOUNTER — Other Ambulatory Visit: Payer: Self-pay | Admitting: Internal Medicine

## 2016-04-25 DIAGNOSIS — I255 Ischemic cardiomyopathy: Secondary | ICD-10-CM | POA: Diagnosis not present

## 2016-04-25 DIAGNOSIS — N183 Chronic kidney disease, stage 3 (moderate): Secondary | ICD-10-CM | POA: Diagnosis not present

## 2016-04-25 DIAGNOSIS — I252 Old myocardial infarction: Secondary | ICD-10-CM | POA: Diagnosis not present

## 2016-04-25 DIAGNOSIS — I25110 Atherosclerotic heart disease of native coronary artery with unstable angina pectoris: Secondary | ICD-10-CM | POA: Diagnosis not present

## 2016-04-25 DIAGNOSIS — I5023 Acute on chronic systolic (congestive) heart failure: Secondary | ICD-10-CM | POA: Diagnosis not present

## 2016-04-25 DIAGNOSIS — I130 Hypertensive heart and chronic kidney disease with heart failure and stage 1 through stage 4 chronic kidney disease, or unspecified chronic kidney disease: Secondary | ICD-10-CM | POA: Diagnosis not present

## 2016-04-26 DIAGNOSIS — Z5181 Encounter for therapeutic drug level monitoring: Secondary | ICD-10-CM | POA: Diagnosis not present

## 2016-04-26 DIAGNOSIS — Z452 Encounter for adjustment and management of vascular access device: Secondary | ICD-10-CM | POA: Diagnosis not present

## 2016-04-26 DIAGNOSIS — Z7901 Long term (current) use of anticoagulants: Secondary | ICD-10-CM | POA: Diagnosis not present

## 2016-04-26 DIAGNOSIS — N183 Chronic kidney disease, stage 3 (moderate): Secondary | ICD-10-CM | POA: Diagnosis not present

## 2016-04-26 DIAGNOSIS — I252 Old myocardial infarction: Secondary | ICD-10-CM | POA: Diagnosis not present

## 2016-04-26 DIAGNOSIS — I5023 Acute on chronic systolic (congestive) heart failure: Secondary | ICD-10-CM | POA: Diagnosis not present

## 2016-04-26 DIAGNOSIS — I255 Ischemic cardiomyopathy: Secondary | ICD-10-CM | POA: Diagnosis not present

## 2016-04-26 DIAGNOSIS — Z950 Presence of cardiac pacemaker: Secondary | ICD-10-CM | POA: Diagnosis not present

## 2016-04-26 DIAGNOSIS — E785 Hyperlipidemia, unspecified: Secondary | ICD-10-CM | POA: Diagnosis not present

## 2016-04-26 DIAGNOSIS — I130 Hypertensive heart and chronic kidney disease with heart failure and stage 1 through stage 4 chronic kidney disease, or unspecified chronic kidney disease: Secondary | ICD-10-CM | POA: Diagnosis not present

## 2016-04-26 DIAGNOSIS — Z955 Presence of coronary angioplasty implant and graft: Secondary | ICD-10-CM | POA: Diagnosis not present

## 2016-04-26 DIAGNOSIS — I25110 Atherosclerotic heart disease of native coronary artery with unstable angina pectoris: Secondary | ICD-10-CM | POA: Diagnosis not present

## 2016-04-27 ENCOUNTER — Ambulatory Visit (INDEPENDENT_AMBULATORY_CARE_PROVIDER_SITE_OTHER): Payer: Medicare Other | Admitting: *Deleted

## 2016-04-27 DIAGNOSIS — I255 Ischemic cardiomyopathy: Secondary | ICD-10-CM

## 2016-04-27 NOTE — Progress Notes (Signed)
Remote ICD transmission.   

## 2016-04-28 ENCOUNTER — Encounter: Payer: Self-pay | Admitting: Cardiology

## 2016-04-28 LAB — CUP PACEART REMOTE DEVICE CHECK
Battery Remaining Longevity: 35 mo
Battery Voltage: 2.89 V
Brady Statistic AS VP Percent: 1 %
Brady Statistic RA Percent Paced: 1 %
HIGH POWER IMPEDANCE MEASURED VALUE: 57 Ohm
Implantable Lead Location: 753860
Implantable Lead Model: 7000
Lead Channel Impedance Value: 300 Ohm
Lead Channel Impedance Value: 410 Ohm
Lead Channel Pacing Threshold Amplitude: 0.75 V
Lead Channel Pacing Threshold Pulse Width: 0.5 ms
Lead Channel Pacing Threshold Pulse Width: 0.7 ms
Lead Channel Sensing Intrinsic Amplitude: 4.7 mV
MDC IDC LEAD IMPLANT DT: 20121107
MDC IDC LEAD IMPLANT DT: 20121107
MDC IDC LEAD LOCATION: 753859
MDC IDC MSMT BATTERY REMAINING PERCENTAGE: 41 %
MDC IDC MSMT LEADCHNL RV PACING THRESHOLD AMPLITUDE: 1.25 V
MDC IDC MSMT LEADCHNL RV SENSING INTR AMPL: 11.5 mV
MDC IDC PG IMPLANT DT: 20121107
MDC IDC PG SERIAL: 1033795
MDC IDC SESS DTM: 20180412060020
MDC IDC SET LEADCHNL RA PACING AMPLITUDE: 2 V
MDC IDC SET LEADCHNL RV PACING AMPLITUDE: 2.5 V
MDC IDC SET LEADCHNL RV PACING PULSEWIDTH: 0.7 ms
MDC IDC SET LEADCHNL RV SENSING SENSITIVITY: 0.5 mV
MDC IDC STAT BRADY AP VP PERCENT: 1 %
MDC IDC STAT BRADY AP VS PERCENT: 1 %
MDC IDC STAT BRADY AS VS PERCENT: 97 %
MDC IDC STAT BRADY RV PERCENT PACED: 1 %

## 2016-05-01 DIAGNOSIS — N2581 Secondary hyperparathyroidism of renal origin: Secondary | ICD-10-CM | POA: Diagnosis not present

## 2016-05-01 DIAGNOSIS — I10 Essential (primary) hypertension: Secondary | ICD-10-CM | POA: Diagnosis not present

## 2016-05-01 DIAGNOSIS — I509 Heart failure, unspecified: Secondary | ICD-10-CM | POA: Diagnosis not present

## 2016-05-01 DIAGNOSIS — N183 Chronic kidney disease, stage 3 (moderate): Secondary | ICD-10-CM | POA: Diagnosis not present

## 2016-05-03 DIAGNOSIS — I25110 Atherosclerotic heart disease of native coronary artery with unstable angina pectoris: Secondary | ICD-10-CM | POA: Diagnosis not present

## 2016-05-03 DIAGNOSIS — N183 Chronic kidney disease, stage 3 (moderate): Secondary | ICD-10-CM | POA: Diagnosis not present

## 2016-05-03 DIAGNOSIS — I252 Old myocardial infarction: Secondary | ICD-10-CM | POA: Diagnosis not present

## 2016-05-03 DIAGNOSIS — I255 Ischemic cardiomyopathy: Secondary | ICD-10-CM | POA: Diagnosis not present

## 2016-05-03 DIAGNOSIS — I5023 Acute on chronic systolic (congestive) heart failure: Secondary | ICD-10-CM | POA: Diagnosis not present

## 2016-05-03 DIAGNOSIS — I130 Hypertensive heart and chronic kidney disease with heart failure and stage 1 through stage 4 chronic kidney disease, or unspecified chronic kidney disease: Secondary | ICD-10-CM | POA: Diagnosis not present

## 2016-05-04 ENCOUNTER — Other Ambulatory Visit (HOSPITAL_COMMUNITY): Payer: Self-pay | Admitting: Adult Health

## 2016-05-04 DIAGNOSIS — Z5181 Encounter for therapeutic drug level monitoring: Secondary | ICD-10-CM | POA: Diagnosis not present

## 2016-05-04 DIAGNOSIS — I5023 Acute on chronic systolic (congestive) heart failure: Secondary | ICD-10-CM | POA: Diagnosis not present

## 2016-05-04 DIAGNOSIS — I25110 Atherosclerotic heart disease of native coronary artery with unstable angina pectoris: Secondary | ICD-10-CM | POA: Diagnosis not present

## 2016-05-04 DIAGNOSIS — I252 Old myocardial infarction: Secondary | ICD-10-CM | POA: Diagnosis not present

## 2016-05-04 DIAGNOSIS — I130 Hypertensive heart and chronic kidney disease with heart failure and stage 1 through stage 4 chronic kidney disease, or unspecified chronic kidney disease: Secondary | ICD-10-CM | POA: Diagnosis not present

## 2016-05-04 DIAGNOSIS — I255 Ischemic cardiomyopathy: Secondary | ICD-10-CM | POA: Diagnosis not present

## 2016-05-04 DIAGNOSIS — N183 Chronic kidney disease, stage 3 (moderate): Secondary | ICD-10-CM | POA: Diagnosis not present

## 2016-05-08 ENCOUNTER — Ambulatory Visit (HOSPITAL_COMMUNITY)
Admission: RE | Admit: 2016-05-08 | Discharge: 2016-05-08 | Disposition: A | Discharge status: Home / Self Care | Payer: Medicare Other | Source: Ambulatory Visit | Attending: Internal Medicine | Admitting: Internal Medicine

## 2016-05-08 VITALS — BP 138/102 | HR 100 | Wt 154.8 lb

## 2016-05-08 DIAGNOSIS — E785 Hyperlipidemia, unspecified: Secondary | ICD-10-CM | POA: Diagnosis not present

## 2016-05-08 DIAGNOSIS — I252 Old myocardial infarction: Secondary | ICD-10-CM | POA: Insufficient documentation

## 2016-05-08 DIAGNOSIS — I5023 Acute on chronic systolic (congestive) heart failure: Secondary | ICD-10-CM | POA: Diagnosis not present

## 2016-05-08 DIAGNOSIS — I255 Ischemic cardiomyopathy: Secondary | ICD-10-CM | POA: Insufficient documentation

## 2016-05-08 DIAGNOSIS — Z7982 Long term (current) use of aspirin: Secondary | ICD-10-CM | POA: Diagnosis not present

## 2016-05-08 DIAGNOSIS — Z955 Presence of coronary angioplasty implant and graft: Secondary | ICD-10-CM | POA: Diagnosis not present

## 2016-05-08 DIAGNOSIS — N183 Chronic kidney disease, stage 3 unspecified: Secondary | ICD-10-CM

## 2016-05-08 DIAGNOSIS — I10 Essential (primary) hypertension: Secondary | ICD-10-CM

## 2016-05-08 DIAGNOSIS — M109 Gout, unspecified: Secondary | ICD-10-CM | POA: Diagnosis not present

## 2016-05-08 DIAGNOSIS — I2510 Atherosclerotic heart disease of native coronary artery without angina pectoris: Secondary | ICD-10-CM | POA: Insufficient documentation

## 2016-05-08 DIAGNOSIS — Z809 Family history of malignant neoplasm, unspecified: Secondary | ICD-10-CM | POA: Insufficient documentation

## 2016-05-08 DIAGNOSIS — I5042 Chronic combined systolic (congestive) and diastolic (congestive) heart failure: Secondary | ICD-10-CM

## 2016-05-08 DIAGNOSIS — Z8249 Family history of ischemic heart disease and other diseases of the circulatory system: Secondary | ICD-10-CM | POA: Diagnosis not present

## 2016-05-08 DIAGNOSIS — Z95810 Presence of automatic (implantable) cardiac defibrillator: Secondary | ICD-10-CM

## 2016-05-08 DIAGNOSIS — I130 Hypertensive heart and chronic kidney disease with heart failure and stage 1 through stage 4 chronic kidney disease, or unspecified chronic kidney disease: Secondary | ICD-10-CM | POA: Insufficient documentation

## 2016-05-08 NOTE — Patient Instructions (Signed)
No changes to medication at this time.  No lab work today.  Follow up 6 weeks with Amy Clegg NP-C.  Do the following things EVERYDAY: 1) Weigh yourself in the morning before breakfast. Write it down and keep it in a log. 2) Take your medicines as prescribed 3) Eat low salt foods-Limit salt (sodium) to 2000 mg per day.  4) Stay as active as you can everyday 5) Limit all fluids for the day to less than 2 liters
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
## 2016-05-08 NOTE — Progress Notes (Signed)
Patient ID: Marvin Martin, male   DOB: 05-26-1965, 51 y.o.   MRN: [id number]    Advanced Heart Failure Clinic Note   Primary Care: Dr Laney Pastor Primary Cardiologist: Dr. Haroldine Laws  Nephrologist: Dr Joelyn Oms   HPI: Marvin Martin is a 51 year old male with history of CAD, ICM, MI X4 (presents with chest burning radiating to L shoulder) with 17 stents, chronic systolic HF with EF 68% s/p St. Jude ICD, HTN, gout, and hyperlipidemia.  He just relocated to [facility] from [place]. His medical care was provided by [name]. He has struggled with in-stent restenosis. Most recent heart cath was February 2016 with stent placed. He was referred to HF clinic by Dr Laney Pastor to establish HF care.    Admitted May 8th through May 12th, 2017 with chest pain. Had LHC as noted below no intervention.   Admitted June 5th through June 14th, 1157 with A/C systolic CHF. Had repeat R/LHC as below with stable CAD and low output. Started on milrinone and weaned to 0.125 mcg/kg/min for d/c. Coreg, lasix, and spiro stopped.  Entresto dropped from full dose to 24/26 mg, subsequently stopped.   Today he returns for HF follow up. Last visit losartan was stopped and entresto was started. Remains on milrinone 0.25 mcg. Overall feeling ok. Denies SOB/PND/Orthopnea. No CP. No fever or chills. Appetite ok. Having trouble paying for food. Taking all medications. Lives alone. [facility] managing home milrinone.   01/2015 ECHO  EF 20-25% RV normal  03/21/15: EF 15-20% RV ok Personally reviewed  Corevue: Volume below threshold. No VF.  [facility] 06/23/15  Dist RCA-1 lesion, 100% stenosed. The lesion was previously treated with a stent (unknown type).  Dist RCA-2 lesion, 100% stenosed. The lesion was previously treated with a stent (unknown type).  Prox RCA lesion, 70% stenosed. The lesion was previously treated with a stent (unknown type) and angioplasty.  Mid RCA lesion, 50% stenosed. The lesion was previously treated with a stent  (unknown type).  Ost RPDA to RPDA lesion, 75% stenosed. The lesion was previously treated with a drug-eluting stent.  Mid LAD lesion, 70% stenosed.  Ost 2nd Diag lesion, 70% stenosed. Ao = 87/62 (71) LV = 81/9 RA = 2 RV = 20/0/2 PA = 24/12 (16) PCW = 4 Fick cardiac output/index = 3.1/1.8 Thermo CO/CI = 3.0/1.7 PVR = 3.9 WU Ao sat = 97% PA sat = 54%, 55%  R/LHC 05/27/15  Dist RCA-1 lesion, 100% stenosed. The lesion was previously treated with a stent (unknown type).  Dist RCA-2 lesion, 100% stenosed. The lesion was previously treated with a stent (unknown type).  Prox RCA lesion, 60% stenosed. The lesion was previously treated with a stent (unknown type) and angioplasty.  Mid RCA lesion, 40% stenosed. The lesion was previously treated with a stent (unknown type). Findings: RA = 1 RV = 22/1/5 PA = 27/13 (19) PCW = 6 Ao = 120/78 (96) LV = 124/4/13 Fick cardiac output/index = 3.3/1.9 PVR = 4.0 WU SVR = 2335  FA sat = 94% PA sat = 60%. 60%  CPX 01/15/15 FVC 2.67 (65%)    FEV1 2.18 (64%)     FEV1/FVC 81 (100%)     MVV 105 (73%) Peak VO2: 20.3 (56.3% predicted peak VO2) VE/VCO2 slope: 26.8 OUES: 1.52 Peak RER: 1.21  CPX 06/2015  Peak VO2: 19.9 (55.8% predicted peak VO2) VE/VCO2 slope: 31.6 OUES: 1.42 Peak RER: 1.26  Labs: 05/08/2016: Creatinine 2.44 K 4.8   SH: Separated from his wife. Has  4 children. 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         Father deceased but he is unsure of the cause. He had hyperlipidemia.         Sister: Heart disease  [facility]  [address] [place] [address] [phone]   Past Medical History:  Diagnosis Date  . Chronic systolic CHF (congestive heart failure) ([place])    a. 01/2015 Echo: EF 20-25%, antlat, inflat AK.  [name] CKD (chronic kidney disease), stage III   . Coronary artery disease    a. s/p MI x 4;  b. Reported h/o 17 stents with recurrent ISR;  c.  02/2014 Cath ([place]): PCI to unknown vessel;  d. 01/2015 MV: EF 18%, large scar, no ischemia; e. 03/2015 PCI: LM nl, LAD nl, D2 patent stent, RI patent stent, LCX patent stents p/m, RCA 95p ISR (PTCA), 24m, 100d into RPL CTO, RPDA 90 (2.5x16 Synergy DES).  . Hyperlipidemia   . Hypertensive heart disease   . Ischemic cardiomyopathy    a. s/p SJM ICD 2007 w/ gen change in 2012; b. 12/2014 CPX Test: mild to mod HF limitation w/ pVO2 20.3 and nl slope;  c. 01/2015 Echo: EF 20-25%.    Current Outpatient Prescriptions  Medication Sig Dispense Refill  . allopurinol (ZYLOPRIM) 100 MG tablet Take 100 mg by mouth 2 (two) times daily.     . AMBULATORY NON FORMULARY MEDICATION Inject 0.125 mcg/kg/min into the vein continuous. Medication Name: Milrinone    . amLODipine (NORVASC) 10 MG tablet Take 1 tablet (10 mg total) by mouth daily. 30 tablet 3  . amLODipine (NORVASC) 10 MG tablet TAKE 1 TABLET (10 MG TOTAL) BY MOUTH DAILY. 30 tablet 11  . aspirin 81 MG tablet Take 81 mg by mouth daily.    . cholecalciferol (VITAMIN D) 1000 UNITS tablet Take 1,000 Units by mouth daily.    [name] ENTRESTO 24-26 MG Take 1 tablet by mouth 2 (two) times daily. 60 tablet 6  . ezetimibe (ZETIA) 10 MG tablet TAKE 1 TABLET BY MOUTH EVERY DAY 90 tablet 0  . hydrALAZINE (APRESOLINE) 100 MG tablet Take 1 tablet (100 mg total) by mouth 3 (three) times daily. 270 tablet 3  . isosorbide mononitrate (IMDUR) 120 MG 24 hr tablet Take 1 tablet (120 mg total) by mouth daily. 30 tablet 5  . ivabradine (CORLANOR) 5 MG TABS tablet Take 1 tablet (5 mg total) by mouth 2 (two) times daily with a meal. 60 tablet 3  . Magnesium Oxide 200 MG TABS Take 1 tablet (200 mg total) by mouth 2 (two) times daily. 180 tablet 3  . nitroGLYCERIN (NITROSTAT) 0.4 MG SL tablet Place 1 tablet (0.4 mg total) under the tongue every 5 (five) minutes as needed for chest pain. Reported on 04/12/2015 60 tablet 1  . omega-3 acid ethyl esters (LOVAZA) 1 G capsule Take 1 g by mouth  daily.     . ondansetron (ZOFRAN) 4 MG tablet Take 4 mg by mouth every 8 (eight) hours as needed for nausea or vomiting. Reported on 05/24/2015    . PRALUENT 75 MG/ML SOPN INJECT 75MG  (1 PEN) SUBCUTANEOUSLY EVERY 14 DAYS 2 pen 11  . rosuvastatin (CRESTOR) 40 MG tablet Take 1 tablet (40 mg total) by mouth daily. 90 tablet 3  . ticagrelor (BRILINTA) 90 MG TABS tablet Take 1 tablet (90 mg total) by mouth 2 (two) times daily. 180 tablet 1   No current  facility-administered medications for this encounter.     No Known Allergies    Social History   Social History  . Marital status: Legally Separated    Spouse name: N/A  . Number of children: N/A  . Years of education: N/A   Occupational History  . Not on file.   Social History Main Topics  . Smoking status: Never Smoker  . Smokeless tobacco: Not on file  . Alcohol use No  . Drug use: No  . Sexual activity: No   Other Topics Concern  . Not on file   Social History Narrative   Moved to [place] from [place] in 2016.  Lives alone in [place].      Family History  Problem Relation Age of Onset  . Cancer Mother     died when pt was 89.  She had a h/o heart dzs as well.  . Hypertension Mother   . Hyperlipidemia Father     Father died when pt was age 41 - he believes that he had a cardiac hx.  [name] CAD Sister     Vitals:   05/08/16 0950  BP: (!) 138/102  Pulse: 100  SpO2: 98%  Weight: 154 lb 12.8 oz (70.2 kg)   Wt Readings from Last 3 Encounters:  05/08/16 154 lb 12.8 oz (70.2 kg)  04/07/16 154 lb (69.9 kg)  02/23/16 154 lb 12.8 oz (70.2 kg)     PHYSICAL EXAM: General:  Well appearing. No resp difficulty HEENT: normal Neck: supple. no JVD. Carotids 2+ bilat; no bruits. No lymphadenopathy or thryomegaly appreciated. Cor: PMI nondisplaced. Regular rate & rhythm. No rubs, gallops or murmurs. Lungs: clear Abdomen: soft, nontender, nondistended. No hepatosplenomegaly. No bruits or masses. Good bowel sounds. Extremities: no cyanosis,  clubbing, rash, edema. RUE PICC. Nontender,erythema.  Neuro: alert & orientedx3, cranial nerves grossly intact. moves all 4 extremities w/o difficulty. Affect pleasant   ASSESSMENT & PLAN:  1. Chronic Systolic Heart Failure - due to iCM. EF 20-25% on echo 1/17. RV ok. Has ST Jude ICD - Echo 02/21/16 EF 15-20%. Rv ok.  NYHA II. Volume status stable and verifed by corvue. Continue milrinone 0.25 mcg via PICC. Intolerant wean.   No bb with low output. Continue corlanor 5 mg twice a day.  Continue entresto 24-26 mg twice a day. I reviewed BMET from 4/16. Stable.  Continue current dose of hydralazine and imdur.  Not a candidate for transplant/advanced therapies due to lack of social support.  2. CAD- MI X4 17 stents  Most recent [place] 06/2015 - no intervention continued medical management.    Dist RCA-1 lesion, 100% stenosed. The lesion was previously treated with a stent (unknown type).  Dist RCA-2 lesion, 100% stenosed. The lesion was previously treated with a stent (unknown type).  Prox RCA lesion, 70% stenosed. The lesion was previously treated with a stent (unknown type) and angioplasty.  Mid RCA lesion, 50% stenosed. The lesion was previously treated with a stent (unknown type).  Ost RPDA to RPDA lesion, 75% stenosed. The lesion was previously treated with a drug-eluting stent.  Mid LAD lesion, 70% stenosed.  Ost 2nd Diag lesion, 70% stenosed. -No CP. Continue asa, statin, and ticagrelor.   - No BB with low output and on milrinone.   3. HTN - Up a little. Continue current regimen. 4. Hyperlipidemia - Per lipid clinic. Continue current statin regimen.  5. CKD stage III  BMET reviewed from 05/01/2016. Stable.   Follow up in 6 weeks. Continue [facility]  for home milrinone.       Seraphina Mitchner NP-C  9:54 AM

## 2016-05-10 DIAGNOSIS — I252 Old myocardial infarction: Secondary | ICD-10-CM | POA: Diagnosis not present

## 2016-05-10 DIAGNOSIS — I5023 Acute on chronic systolic (congestive) heart failure: Secondary | ICD-10-CM | POA: Diagnosis not present

## 2016-05-10 DIAGNOSIS — I255 Ischemic cardiomyopathy: Secondary | ICD-10-CM | POA: Diagnosis not present

## 2016-05-10 DIAGNOSIS — I25110 Atherosclerotic heart disease of native coronary artery with unstable angina pectoris: Secondary | ICD-10-CM | POA: Diagnosis not present

## 2016-05-10 DIAGNOSIS — N183 Chronic kidney disease, stage 3 (moderate): Secondary | ICD-10-CM | POA: Diagnosis not present

## 2016-05-10 DIAGNOSIS — I130 Hypertensive heart and chronic kidney disease with heart failure and stage 1 through stage 4 chronic kidney disease, or unspecified chronic kidney disease: Secondary | ICD-10-CM | POA: Diagnosis not present

## 2016-05-12 ENCOUNTER — Other Ambulatory Visit (HOSPITAL_COMMUNITY): Payer: Self-pay | Admitting: Internal Medicine

## 2016-05-17 DIAGNOSIS — I252 Old myocardial infarction: Secondary | ICD-10-CM | POA: Diagnosis not present

## 2016-05-17 DIAGNOSIS — N183 Chronic kidney disease, stage 3 (moderate): Secondary | ICD-10-CM | POA: Diagnosis not present

## 2016-05-17 DIAGNOSIS — I255 Ischemic cardiomyopathy: Secondary | ICD-10-CM | POA: Diagnosis not present

## 2016-05-17 DIAGNOSIS — I130 Hypertensive heart and chronic kidney disease with heart failure and stage 1 through stage 4 chronic kidney disease, or unspecified chronic kidney disease: Secondary | ICD-10-CM | POA: Diagnosis not present

## 2016-05-17 DIAGNOSIS — I25110 Atherosclerotic heart disease of native coronary artery with unstable angina pectoris: Secondary | ICD-10-CM | POA: Diagnosis not present

## 2016-05-17 DIAGNOSIS — I5023 Acute on chronic systolic (congestive) heart failure: Secondary | ICD-10-CM | POA: Diagnosis not present

## 2016-05-24 DIAGNOSIS — N183 Chronic kidney disease, stage 3 (moderate): Secondary | ICD-10-CM | POA: Diagnosis not present

## 2016-05-24 DIAGNOSIS — I255 Ischemic cardiomyopathy: Secondary | ICD-10-CM | POA: Diagnosis not present

## 2016-05-24 DIAGNOSIS — I130 Hypertensive heart and chronic kidney disease with heart failure and stage 1 through stage 4 chronic kidney disease, or unspecified chronic kidney disease: Secondary | ICD-10-CM | POA: Diagnosis not present

## 2016-05-24 DIAGNOSIS — I252 Old myocardial infarction: Secondary | ICD-10-CM | POA: Diagnosis not present

## 2016-05-24 DIAGNOSIS — I5023 Acute on chronic systolic (congestive) heart failure: Secondary | ICD-10-CM | POA: Diagnosis not present

## 2016-05-24 DIAGNOSIS — I25110 Atherosclerotic heart disease of native coronary artery with unstable angina pectoris: Secondary | ICD-10-CM | POA: Diagnosis not present

## 2016-05-31 DIAGNOSIS — I25110 Atherosclerotic heart disease of native coronary artery with unstable angina pectoris: Secondary | ICD-10-CM | POA: Diagnosis not present

## 2016-05-31 DIAGNOSIS — I252 Old myocardial infarction: Secondary | ICD-10-CM | POA: Diagnosis not present

## 2016-05-31 DIAGNOSIS — I255 Ischemic cardiomyopathy: Secondary | ICD-10-CM | POA: Diagnosis not present

## 2016-05-31 DIAGNOSIS — I130 Hypertensive heart and chronic kidney disease with heart failure and stage 1 through stage 4 chronic kidney disease, or unspecified chronic kidney disease: Secondary | ICD-10-CM | POA: Diagnosis not present

## 2016-05-31 DIAGNOSIS — I5023 Acute on chronic systolic (congestive) heart failure: Secondary | ICD-10-CM | POA: Diagnosis not present

## 2016-05-31 DIAGNOSIS — N183 Chronic kidney disease, stage 3 (moderate): Secondary | ICD-10-CM | POA: Diagnosis not present

## 2016-06-07 DIAGNOSIS — I25110 Atherosclerotic heart disease of native coronary artery with unstable angina pectoris: Secondary | ICD-10-CM | POA: Diagnosis not present

## 2016-06-07 DIAGNOSIS — I255 Ischemic cardiomyopathy: Secondary | ICD-10-CM | POA: Diagnosis not present

## 2016-06-07 DIAGNOSIS — I5023 Acute on chronic systolic (congestive) heart failure: Secondary | ICD-10-CM | POA: Diagnosis not present

## 2016-06-07 DIAGNOSIS — I252 Old myocardial infarction: Secondary | ICD-10-CM | POA: Diagnosis not present

## 2016-06-07 DIAGNOSIS — I130 Hypertensive heart and chronic kidney disease with heart failure and stage 1 through stage 4 chronic kidney disease, or unspecified chronic kidney disease: Secondary | ICD-10-CM | POA: Diagnosis not present

## 2016-06-07 DIAGNOSIS — N183 Chronic kidney disease, stage 3 (moderate): Secondary | ICD-10-CM | POA: Diagnosis not present

## 2016-06-13 ENCOUNTER — Other Ambulatory Visit: Payer: Self-pay | Admitting: Internal Medicine

## 2016-06-14 DIAGNOSIS — I130 Hypertensive heart and chronic kidney disease with heart failure and stage 1 through stage 4 chronic kidney disease, or unspecified chronic kidney disease: Secondary | ICD-10-CM | POA: Diagnosis not present

## 2016-06-14 DIAGNOSIS — N183 Chronic kidney disease, stage 3 (moderate): Secondary | ICD-10-CM | POA: Diagnosis not present

## 2016-06-14 DIAGNOSIS — I5023 Acute on chronic systolic (congestive) heart failure: Secondary | ICD-10-CM | POA: Diagnosis not present

## 2016-06-14 DIAGNOSIS — I25110 Atherosclerotic heart disease of native coronary artery with unstable angina pectoris: Secondary | ICD-10-CM | POA: Diagnosis not present

## 2016-06-14 DIAGNOSIS — I252 Old myocardial infarction: Secondary | ICD-10-CM | POA: Diagnosis not present

## 2016-06-14 DIAGNOSIS — I255 Ischemic cardiomyopathy: Secondary | ICD-10-CM | POA: Diagnosis not present

## 2016-06-19 ENCOUNTER — Ambulatory Visit (HOSPITAL_COMMUNITY)
Admission: RE | Admit: 2016-06-19 | Discharge: 2016-06-19 | Disposition: A | Discharge status: Home / Self Care | Payer: Medicare Other | Source: Ambulatory Visit | Attending: Cardiology | Admitting: Cardiology

## 2016-06-19 VITALS — BP 106/80 | HR 78 | Wt 151.8 lb

## 2016-06-19 DIAGNOSIS — E785 Hyperlipidemia, unspecified: Secondary | ICD-10-CM | POA: Diagnosis not present

## 2016-06-19 DIAGNOSIS — I255 Ischemic cardiomyopathy: Secondary | ICD-10-CM | POA: Insufficient documentation

## 2016-06-19 DIAGNOSIS — I5023 Acute on chronic systolic (congestive) heart failure: Secondary | ICD-10-CM | POA: Diagnosis not present

## 2016-06-19 DIAGNOSIS — M109 Gout, unspecified: Secondary | ICD-10-CM | POA: Diagnosis not present

## 2016-06-19 DIAGNOSIS — Z8249 Family history of ischemic heart disease and other diseases of the circulatory system: Secondary | ICD-10-CM | POA: Insufficient documentation

## 2016-06-19 DIAGNOSIS — I2510 Atherosclerotic heart disease of native coronary artery without angina pectoris: Secondary | ICD-10-CM | POA: Insufficient documentation

## 2016-06-19 DIAGNOSIS — Z809 Family history of malignant neoplasm, unspecified: Secondary | ICD-10-CM | POA: Insufficient documentation

## 2016-06-19 DIAGNOSIS — I5042 Chronic combined systolic (congestive) and diastolic (congestive) heart failure: Secondary | ICD-10-CM | POA: Diagnosis not present

## 2016-06-19 DIAGNOSIS — I10 Essential (primary) hypertension: Secondary | ICD-10-CM

## 2016-06-19 DIAGNOSIS — Z95810 Presence of automatic (implantable) cardiac defibrillator: Secondary | ICD-10-CM | POA: Diagnosis not present

## 2016-06-19 DIAGNOSIS — Z955 Presence of coronary angioplasty implant and graft: Secondary | ICD-10-CM | POA: Diagnosis not present

## 2016-06-19 DIAGNOSIS — I130 Hypertensive heart and chronic kidney disease with heart failure and stage 1 through stage 4 chronic kidney disease, or unspecified chronic kidney disease: Secondary | ICD-10-CM | POA: Diagnosis not present

## 2016-06-19 DIAGNOSIS — Z7982 Long term (current) use of aspirin: Secondary | ICD-10-CM | POA: Diagnosis not present

## 2016-06-19 DIAGNOSIS — N183 Chronic kidney disease, stage 3 unspecified: Secondary | ICD-10-CM

## 2016-06-19 DIAGNOSIS — R002 Palpitations: Secondary | ICD-10-CM | POA: Diagnosis not present

## 2016-06-19 DIAGNOSIS — I252 Old myocardial infarction: Secondary | ICD-10-CM | POA: Insufficient documentation

## 2016-06-19 NOTE — Progress Notes (Signed)
Patient ID: Marvin Martin, male   DOB: 06-13-1965, 51 y.o.   MRN: [id number]    Advanced Heart Failure Clinic Note   Primary Care: Dr Laney Pastor Primary Cardiologist: Dr. Haroldine Laws  Nephrologist: Dr Joelyn Oms   HPI: Marvin Martin is a 51 year old male with history of CAD, ICM, MI X4  with 17 stents, chronic systolic HF with EF 97% s/p St. Jude ICD, HTN, gout, and hyperlipidemia.  He just relocated to [facility] from [place]. His medical care was provided by [facility]. He has struggled with in-stent restenosis. Most recent heart cath was February 2016 with stent placed. He was referred to HF clinic by Dr Laney Pastor to establish HF care.    Admitted May 8th through May 12th, 2017 with chest pain. Had LHC as noted below no intervention.   Admitted June 5th through June 14th, 6734 with A/C systolic CHF. Had repeat R/LHC as below with stable CAD and low output. Started on milrinone and weaned to 0.125 mcg/kg/min for d/c. Coreg, lasix, and spiro stopped.  Entresto dropped from full dose to 24/26 mg, subsequently stopped.   He returns today for HF follow up. He felt like this morning he was having heart palpitations, was rushing to get here for his appointment. Denies SOB with stairs, has 9 steps in his home. He can walk a block and half before he has to stop for rest. No chest pain. Eating low salt foods, drinking less than 2L a day. Has not missed any doses of his medications. Weights at home 152-153 pounds.   01/2015 ECHO  EF 20-25% RV normal  03/21/15: EF 15-20% RV ok Personally reviewed 03/2016: Echo EF 15-20%.   Corevue:  R/LHC 06/23/15  Dist RCA-1 lesion, 100% stenosed. The lesion was previously treated with a stent (unknown type).  Dist RCA-2 lesion, 100% stenosed. The lesion was previously treated with a stent (unknown type).  Prox RCA lesion, 70% stenosed. The lesion was previously treated with a stent (unknown type) and angioplasty.  Mid RCA lesion, 50% stenosed. The lesion was  previously treated with a stent (unknown type).  Ost RPDA to RPDA lesion, 75% stenosed. The lesion was previously treated with a drug-eluting stent.  Mid LAD lesion, 70% stenosed.  Ost 2nd Diag lesion, 70% stenosed. Ao = 87/62 (71) LV = 81/9 RA = 2 RV = 20/0/2 PA = 24/12 (16) PCW = 4 Fick cardiac output/index = 3.1/1.8 Thermo CO/CI = 3.0/1.7 PVR = 3.9 WU Ao sat = 97% PA sat = 54%, 55%  R/LHC 05/27/15  Dist RCA-1 lesion, 100% stenosed. The lesion was previously treated with a stent (unknown type).  Dist RCA-2 lesion, 100% stenosed. The lesion was previously treated with a stent (unknown type).  Prox RCA lesion, 60% stenosed. The lesion was previously treated with a stent (unknown type) and angioplasty.  Mid RCA lesion, 40% stenosed. The lesion was previously treated with a stent (unknown type). Findings: RA = 1 RV = 22/1/5 PA = 27/13 (19) PCW = 6 Ao = 120/78 (96) LV = 124/4/13 Fick cardiac output/index = 3.3/1.9 PVR = 4.0 WU SVR = 2335  FA sat = 94% PA sat = 60%. 60%  CPX 01/15/15 FVC 2.67 (65%)    FEV1 2.18 (64%)     FEV1/FVC 81 (100%)     MVV 105 (73%) Peak VO2: 20.3 (56.3% predicted peak VO2) VE/VCO2 slope: 26.8 OUES: 1.52 Peak RER: 1.21  CPX 06/2015  Peak VO2: 19.9 (55.8% predicted peak VO2) VE/VCO2 slope: 31.6  OUES: 1.42 Peak RER: 1.26  Labs: 05/08/2016: Creatinine 2.44 K 4.8   SH: Separated from his wife. Has 4 children. 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         Father deceased but he is unsure of the cause. He had hyperlipidemia.         Sister: Heart disease  [facility]  [address] [place] [address] [phone]   Past Medical History:  Diagnosis Date  . Chronic systolic CHF (congestive heart failure) ([place])    a. 01/2015 Echo: EF 20-25%, antlat, inflat AK.  [name] CKD (chronic kidney disease), stage III   . Coronary artery disease    a. s/p MI x 4;  b. Reported h/o 17  stents with recurrent ISR;  c. 02/2014 Cath ([place]): PCI to unknown vessel;  d. 01/2015 MV: EF 18%, large scar, no ischemia; e. 03/2015 PCI: LM nl, LAD nl, D2 patent stent, RI patent stent, LCX patent stents p/m, RCA 95p ISR (PTCA), 64m, 100d into RPL CTO, RPDA 90 (2.5x16 Synergy DES).  . Hyperlipidemia   . Hypertensive heart disease   . Ischemic cardiomyopathy    a. s/p SJM ICD 2007 w/ gen change in 2012; b. 12/2014 CPX Test: mild to mod HF limitation w/ pVO2 20.3 and nl slope;  c. 01/2015 Echo: EF 20-25%.    Current Outpatient Prescriptions  Medication Sig Dispense Refill  . allopurinol (ZYLOPRIM) 100 MG tablet Take 100 mg by mouth 2 (two) times daily.     . AMBULATORY NON FORMULARY MEDICATION Inject 0.125 mcg/kg/min into the vein continuous. Medication Name: Milrinone    . amLODipine (NORVASC) 10 MG tablet TAKE 1 TABLET (10 MG TOTAL) BY MOUTH DAILY. 30 tablet 11  . aspirin 81 MG tablet Take 81 mg by mouth daily.    . cholecalciferol (VITAMIN D) 1000 UNITS tablet Take 1,000 Units by mouth daily.    [name] ENTRESTO 24-26 MG Take 1 tablet by mouth 2 (two) times daily. 60 tablet 6  . ezetimibe (ZETIA) 10 MG tablet TAKE 1 TABLET BY MOUTH EVERY DAY 90 tablet 0  . hydrALAZINE (APRESOLINE) 100 MG tablet Take 1 tablet (100 mg total) by mouth 3 (three) times daily. 270 tablet 3  . isosorbide mononitrate (IMDUR) 120 MG 24 hr tablet Take 1 tablet (120 mg total) by mouth daily. 30 tablet 5  . ivabradine (CORLANOR) 5 MG TABS tablet Take 1 tablet (5 mg total) by mouth 2 (two) times daily with a meal. 60 tablet 3  . Magnesium Oxide 200 MG TABS Take 1 tablet (200 mg total) by mouth 2 (two) times daily. 180 tablet 3  . nitroGLYCERIN (NITROSTAT) 0.4 MG SL tablet Place 1 tablet (0.4 mg total) under the tongue every 5 (five) minutes as needed for chest pain. Reported on 04/12/2015 60 tablet 1  . omega-3 acid ethyl esters (LOVAZA) 1 G capsule Take 1 g by mouth daily.     . ondansetron (ZOFRAN) 4 MG tablet Take 4 mg by mouth  every 8 (eight) hours as needed for nausea or vomiting. Reported on 05/24/2015    . PRALUENT 75 MG/ML SOPN INJECT 75MG  (1 PEN) SUBCUTANEOUSLY EVERY 14 DAYS 2 pen 11  . rosuvastatin (CRESTOR) 40 MG tablet Take 1 tablet (40 mg total) by mouth daily. 90 tablet 3  . ticagrelor (BRILINTA) 90 MG TABS tablet Take 1 tablet (90 mg total) by mouth 2 (two) times daily. 180 tablet 1   No  current facility-administered medications for this encounter.     No Known Allergies    Social History   Social History  . Marital status: Legally Separated    Spouse name: N/A  . Number of children: N/A  . Years of education: N/A   Occupational History  . Not on file.   Social History Main Topics  . Smoking status: Never Smoker  . Smokeless tobacco: Not on file  . Alcohol use No  . Drug use: No  . Sexual activity: No   Other Topics Concern  . Not on file   Social History Narrative   Moved to [place] from [place] in 2016.  Lives alone in [place].      Family History  Problem Relation Age of Onset  . Cancer Mother        died when pt was 66.  She had a h/o heart dzs as well.  . Hypertension Mother   . Hyperlipidemia Father        Father died when pt was age 78 - he believes that he had a cardiac hx.  [name] CAD Sister     Vitals:   06/19/16 0840  BP: 106/80  Pulse: 78  SpO2: 100%  Weight: 151 lb 12.8 oz (68.9 kg)   Wt Readings from Last 3 Encounters:  06/19/16 151 lb 12.8 oz (68.9 kg)  05/08/16 154 lb 12.8 oz (70.2 kg)  04/07/16 154 lb (69.9 kg)     PHYSICAL EXAM: General:  Well appearing male, NAD. Walked into clinic without difficulty.  HEENT: Normal  Neck: supple. No JVD. Carotids 2+ bilat; no bruits. No lymphadenopathy or thryomegaly appreciated. Cor: PMI nondisplaced. Regular rate and rhythm.. No rubs, gallops or murmurs. Lungs: Clear bilaterally. Normal effort.  Abdomen: soft, nontender, nondistended. No hepatosplenomegaly. No bruits or masses. Bowel sounds present  Extremities: no cyanosis,  clubbing, rash, No Edema. RUE PICC. Nontender,erythema.  Neuro: alert & orientedx3, cranial nerves grossly intact. moves all 4 extremities w/o difficulty. Affect pleasant   ASSESSMENT & PLAN:  1. Chronic Systolic Heart Failure - ICM.  EF 15-20% on echo 3/18. RV ok. Has ST Jude ICD - NYHA II. Volume status stable.  - Continue milrinone 0.25 mcg via PICC. Intolerant wean.   - No bb with low output. - Continue corlanor 5 mg twice a day.  - Continue entresto 24-26 mg twice a day. Will not up titrate with soft BP.  - Continue current dose of hydralazine and imdur.  - Not a candidate for transplant/advanced therapies due to lack of social support. Also his renal function prohibits VAD.  2. CAD- MI X4 17 stents  Most recent [place] 06/2015 - no intervention continued medical management.    Dist RCA-1 lesion, 100% stenosed. The lesion was previously treated with a stent (unknown type).  Dist RCA-2 lesion, 100% stenosed. The lesion was previously treated with a stent (unknown type).  Prox RCA lesion, 70% stenosed. The lesion was previously treated with a stent (unknown type) and angioplasty.  Mid RCA lesion, 50% stenosed. The lesion was previously treated with a stent (unknown type).  Ost RPDA to RPDA lesion, 75% stenosed. The lesion was previously treated with a drug-eluting stent.  Mid LAD lesion, 70% stenosed.  Ost 2nd Diag lesion, 70% stenosed. - Denies chest pain. His anginal equivalent is chest burning, like GERD symptoms.   - Not on beta blocker with low output on home milrinone.  3. HTN - Well controlled on current regimen.  4. Hyperlipidemia -  On [place]. Follows with lipid clinic.   5. CKD stage III  - BMET per [facility].  - Reviewed labs from 05/08/16, creatinine stable.  6. Palpitations - EKG done, no arrhythmias.  Follow up in 6 weeks. Continue [facility] for home milrinone.       Arbutus Leas NP-C  8:56 AM

## 2016-06-19 NOTE — Patient Instructions (Signed)
Follow-up in 6 weeks

## 2016-06-21 DIAGNOSIS — I252 Old myocardial infarction: Secondary | ICD-10-CM | POA: Diagnosis not present

## 2016-06-21 DIAGNOSIS — I25110 Atherosclerotic heart disease of native coronary artery with unstable angina pectoris: Secondary | ICD-10-CM | POA: Diagnosis not present

## 2016-06-21 DIAGNOSIS — I130 Hypertensive heart and chronic kidney disease with heart failure and stage 1 through stage 4 chronic kidney disease, or unspecified chronic kidney disease: Secondary | ICD-10-CM | POA: Diagnosis not present

## 2016-06-21 DIAGNOSIS — N183 Chronic kidney disease, stage 3 (moderate): Secondary | ICD-10-CM | POA: Diagnosis not present

## 2016-06-21 DIAGNOSIS — I255 Ischemic cardiomyopathy: Secondary | ICD-10-CM | POA: Diagnosis not present

## 2016-06-21 DIAGNOSIS — I5023 Acute on chronic systolic (congestive) heart failure: Secondary | ICD-10-CM | POA: Diagnosis not present

## 2016-06-23 ENCOUNTER — Other Ambulatory Visit: Payer: Self-pay | Admitting: Family Medicine

## 2016-06-23 ENCOUNTER — Telehealth (HOSPITAL_COMMUNITY): Payer: Self-pay | Admitting: Pharmacist

## 2016-06-23 NOTE — Telephone Encounter (Signed)
Called to get prescription insurance information as [facility] plan on file was terminated May, 2018 per [address], [place].D. PGY1 Pharmacy Resident 6/8/201811:38 AM Pager [phone]

## 2016-06-25 DIAGNOSIS — E785 Hyperlipidemia, unspecified: Secondary | ICD-10-CM | POA: Diagnosis not present

## 2016-06-25 DIAGNOSIS — I25110 Atherosclerotic heart disease of native coronary artery with unstable angina pectoris: Secondary | ICD-10-CM | POA: Diagnosis not present

## 2016-06-25 DIAGNOSIS — I130 Hypertensive heart and chronic kidney disease with heart failure and stage 1 through stage 4 chronic kidney disease, or unspecified chronic kidney disease: Secondary | ICD-10-CM | POA: Diagnosis not present

## 2016-06-25 DIAGNOSIS — Z5181 Encounter for therapeutic drug level monitoring: Secondary | ICD-10-CM | POA: Diagnosis not present

## 2016-06-25 DIAGNOSIS — N183 Chronic kidney disease, stage 3 (moderate): Secondary | ICD-10-CM | POA: Diagnosis not present

## 2016-06-25 DIAGNOSIS — I252 Old myocardial infarction: Secondary | ICD-10-CM | POA: Diagnosis not present

## 2016-06-25 DIAGNOSIS — Z955 Presence of coronary angioplasty implant and graft: Secondary | ICD-10-CM | POA: Diagnosis not present

## 2016-06-25 DIAGNOSIS — Z7901 Long term (current) use of anticoagulants: Secondary | ICD-10-CM | POA: Diagnosis not present

## 2016-06-25 DIAGNOSIS — I255 Ischemic cardiomyopathy: Secondary | ICD-10-CM | POA: Diagnosis not present

## 2016-06-25 DIAGNOSIS — Z452 Encounter for adjustment and management of vascular access device: Secondary | ICD-10-CM | POA: Diagnosis not present

## 2016-06-25 DIAGNOSIS — I5023 Acute on chronic systolic (congestive) heart failure: Secondary | ICD-10-CM | POA: Diagnosis not present

## 2016-06-25 DIAGNOSIS — Z950 Presence of cardiac pacemaker: Secondary | ICD-10-CM | POA: Diagnosis not present

## 2016-06-28 ENCOUNTER — Other Ambulatory Visit: Payer: Self-pay

## 2016-06-28 DIAGNOSIS — I255 Ischemic cardiomyopathy: Secondary | ICD-10-CM | POA: Diagnosis not present

## 2016-06-28 DIAGNOSIS — I25110 Atherosclerotic heart disease of native coronary artery with unstable angina pectoris: Secondary | ICD-10-CM | POA: Diagnosis not present

## 2016-06-28 DIAGNOSIS — I130 Hypertensive heart and chronic kidney disease with heart failure and stage 1 through stage 4 chronic kidney disease, or unspecified chronic kidney disease: Secondary | ICD-10-CM | POA: Diagnosis not present

## 2016-06-28 DIAGNOSIS — I5023 Acute on chronic systolic (congestive) heart failure: Secondary | ICD-10-CM | POA: Diagnosis not present

## 2016-06-28 DIAGNOSIS — I252 Old myocardial infarction: Secondary | ICD-10-CM | POA: Diagnosis not present

## 2016-06-28 DIAGNOSIS — N183 Chronic kidney disease, stage 3 (moderate): Secondary | ICD-10-CM | POA: Diagnosis not present

## 2016-07-03 ENCOUNTER — Telehealth (HOSPITAL_COMMUNITY): Payer: Self-pay

## 2016-07-03 NOTE — Telephone Encounter (Signed)
Tried calling Mr. Yuan to notify him that his [facility] insurance has been terminated as of May 31,2018, but was unable to get in contact with patient.

## 2016-07-04 ENCOUNTER — Other Ambulatory Visit (HOSPITAL_COMMUNITY): Payer: Self-pay | Admitting: Internal Medicine

## 2016-07-05 DIAGNOSIS — I255 Ischemic cardiomyopathy: Secondary | ICD-10-CM | POA: Diagnosis not present

## 2016-07-05 DIAGNOSIS — I5023 Acute on chronic systolic (congestive) heart failure: Secondary | ICD-10-CM | POA: Diagnosis not present

## 2016-07-05 DIAGNOSIS — I130 Hypertensive heart and chronic kidney disease with heart failure and stage 1 through stage 4 chronic kidney disease, or unspecified chronic kidney disease: Secondary | ICD-10-CM | POA: Diagnosis not present

## 2016-07-05 DIAGNOSIS — I252 Old myocardial infarction: Secondary | ICD-10-CM | POA: Diagnosis not present

## 2016-07-05 DIAGNOSIS — I25110 Atherosclerotic heart disease of native coronary artery with unstable angina pectoris: Secondary | ICD-10-CM | POA: Diagnosis not present

## 2016-07-05 DIAGNOSIS — N183 Chronic kidney disease, stage 3 (moderate): Secondary | ICD-10-CM | POA: Diagnosis not present

## 2016-07-10 ENCOUNTER — Inpatient Hospital Stay (HOSPITAL_COMMUNITY): Admission: RE | Admit: 2016-07-10 | Payer: Medicare Other | Source: Ambulatory Visit

## 2016-07-12 DIAGNOSIS — I5023 Acute on chronic systolic (congestive) heart failure: Secondary | ICD-10-CM | POA: Diagnosis not present

## 2016-07-12 DIAGNOSIS — I130 Hypertensive heart and chronic kidney disease with heart failure and stage 1 through stage 4 chronic kidney disease, or unspecified chronic kidney disease: Secondary | ICD-10-CM | POA: Diagnosis not present

## 2016-07-12 DIAGNOSIS — I252 Old myocardial infarction: Secondary | ICD-10-CM | POA: Diagnosis not present

## 2016-07-12 DIAGNOSIS — I255 Ischemic cardiomyopathy: Secondary | ICD-10-CM | POA: Diagnosis not present

## 2016-07-12 DIAGNOSIS — I25110 Atherosclerotic heart disease of native coronary artery with unstable angina pectoris: Secondary | ICD-10-CM | POA: Diagnosis not present

## 2016-07-12 DIAGNOSIS — N183 Chronic kidney disease, stage 3 (moderate): Secondary | ICD-10-CM | POA: Diagnosis not present

## 2016-07-14 ENCOUNTER — Other Ambulatory Visit (HOSPITAL_COMMUNITY): Payer: Self-pay | Admitting: Internal Medicine

## 2016-07-14 ENCOUNTER — Other Ambulatory Visit: Payer: Self-pay | Admitting: Family Medicine

## 2016-07-19 DIAGNOSIS — N183 Chronic kidney disease, stage 3 (moderate): Secondary | ICD-10-CM | POA: Diagnosis not present

## 2016-07-19 DIAGNOSIS — I255 Ischemic cardiomyopathy: Secondary | ICD-10-CM | POA: Diagnosis not present

## 2016-07-19 DIAGNOSIS — I252 Old myocardial infarction: Secondary | ICD-10-CM | POA: Diagnosis not present

## 2016-07-19 DIAGNOSIS — I130 Hypertensive heart and chronic kidney disease with heart failure and stage 1 through stage 4 chronic kidney disease, or unspecified chronic kidney disease: Secondary | ICD-10-CM | POA: Diagnosis not present

## 2016-07-19 DIAGNOSIS — I25110 Atherosclerotic heart disease of native coronary artery with unstable angina pectoris: Secondary | ICD-10-CM | POA: Diagnosis not present

## 2016-07-19 DIAGNOSIS — I5023 Acute on chronic systolic (congestive) heart failure: Secondary | ICD-10-CM | POA: Diagnosis not present

## 2016-07-26 ENCOUNTER — Ambulatory Visit (HOSPITAL_COMMUNITY)
Admission: RE | Admit: 2016-07-26 | Discharge: 2016-07-26 | Disposition: A | Discharge status: Home / Self Care | Payer: Medicare Other | Source: Ambulatory Visit | Attending: Internal Medicine | Admitting: Internal Medicine

## 2016-07-26 DIAGNOSIS — I130 Hypertensive heart and chronic kidney disease with heart failure and stage 1 through stage 4 chronic kidney disease, or unspecified chronic kidney disease: Secondary | ICD-10-CM | POA: Insufficient documentation

## 2016-07-26 DIAGNOSIS — I252 Old myocardial infarction: Secondary | ICD-10-CM | POA: Insufficient documentation

## 2016-07-26 DIAGNOSIS — Z79899 Other long term (current) drug therapy: Secondary | ICD-10-CM | POA: Insufficient documentation

## 2016-07-26 DIAGNOSIS — Z95810 Presence of automatic (implantable) cardiac defibrillator: Secondary | ICD-10-CM | POA: Insufficient documentation

## 2016-07-26 DIAGNOSIS — E785 Hyperlipidemia, unspecified: Secondary | ICD-10-CM | POA: Insufficient documentation

## 2016-07-26 DIAGNOSIS — N183 Chronic kidney disease, stage 3 (moderate): Secondary | ICD-10-CM | POA: Diagnosis not present

## 2016-07-26 DIAGNOSIS — I2510 Atherosclerotic heart disease of native coronary artery without angina pectoris: Secondary | ICD-10-CM | POA: Insufficient documentation

## 2016-07-26 DIAGNOSIS — Z7902 Long term (current) use of antithrombotics/antiplatelets: Secondary | ICD-10-CM | POA: Insufficient documentation

## 2016-07-26 DIAGNOSIS — I255 Ischemic cardiomyopathy: Secondary | ICD-10-CM | POA: Diagnosis not present

## 2016-07-26 DIAGNOSIS — Z955 Presence of coronary angioplasty implant and graft: Secondary | ICD-10-CM | POA: Insufficient documentation

## 2016-07-26 DIAGNOSIS — I25110 Atherosclerotic heart disease of native coronary artery with unstable angina pectoris: Secondary | ICD-10-CM | POA: Diagnosis not present

## 2016-07-26 DIAGNOSIS — I5022 Chronic systolic (congestive) heart failure: Secondary | ICD-10-CM | POA: Diagnosis present

## 2016-07-26 DIAGNOSIS — I5023 Acute on chronic systolic (congestive) heart failure: Secondary | ICD-10-CM | POA: Diagnosis not present

## 2016-07-26 NOTE — Patient Instructions (Addendum)
It was great to see you today!  Please continue your medications as prescribed.   Please keep your appointment with Amy, NP-C on 07/31/16.

## 2016-07-26 NOTE — Progress Notes (Signed)
HF MD: BENSIMHON  HPI:  Marvin Martin is a 51 yo AA male with history of CAD, ICM, MI X4 (presents with chest burning radiating to L shoulder) with 17 stents, chronic systolic HF with EF 30% s/p St. Jude ICD, HTN, gout, and hyperlipidemia. He just relocated to [facility] from [place]. His medical care was provided by [facility]. He has struggled with in-stent restenosis. Most recent heart cath was February 2016 with stent placed. He was referred to HF clinic by Dr Laney Pastor to establish HF care.   Admitted June 5th through June 14th, 1601 with A/C systolic CHF. Had repeat R/LHC as below with stable CAD and low output. Started on milrinone and weaned to 0.125 mcg/kg/min for d/c. Coreg, lasix, and spiro stopped. Entresto dropped from full dose to 24/26 mg, subsequently stopped for elevation in SCr.   He returns today for pharmacist-led HF medication titration. At last HF clinic visit on 06/19/16, no medication changes were made. Remains on milrinone at 0.25 mcg/kg/min. Overall he is feeling well and is now able to walk a block before getting SOB although exercise has been limited by the heat. Appetite also improved. Denies SOB/PND/Orthopnea. AHC following. Disabled since 2006.     [name] Shortness of breath/dyspnea on exertion? no  . Orthopnea/PND? no . Edema? no . Lightheadedness/dizziness? no . Daily weights at home? Yes - stable ~150 lb  . Blood pressure/heart rate monitoring at home? Yes - stable and wnl per [facility] RN . Following low-sodium/fluid-restricted diet? yes  HF Medications: Hydralazine 100 mg PO TID Isosorbide mononitrate 120 mg PO daily Corlanor 5 mg PO BID Entresto 24-26 mg PO BID  Has the patient been experiencing any side effects to the medications prescribed?  no  Does the patient have any problems obtaining medications due to transportation or finances?   No - [employer] Medicare terminated on 06/15/16 but he obtained transitional [facility]: [id number], ID: [id number],  PCN: [id number]  Understanding of regimen: good Understanding of indications: good Potential of compliance: good Patient understands to avoid NSAIDs. Patient understands to avoid decongestants.     Pertinent Lab Values: . 07/12/16: Serum creatinine 2.94 (BL 2.6-2.9), BUN 41, Potassium 4.5, Sodium 139,  Magnesium 2.2  Vital Signs: . Weight: 150.4 lb (dry weight: 150 lb) . Blood pressure: 132/82 mmHg  . Heart rate: 70 bpm   Assessment: 1. Chronicsystolic CHF (EF 28-31%), due to ICM. NYHA class IIIsymptoms. - Volume status stable - Continue hydralazine 100 mg TID, isosorbide mononitrate 120 mg daily, Corlanor 5 mg BID, Entresto 24-26 mg BID, and milrinone 0.25 mcg/kg/min - With slightly elevation in SCr recently, will not uptitrate Entresto at this time - No BB with low output - Basic disease state pathophysiology, medication indication, mechanism and side effects reviewed at length with patient and he verbalized understanding 2. CAD - MI x 4 (17 stents) - Continue ASA 81 mg daily, Brilinta 90 mg BID, Praluent 75 mg q14 days, Zetia 10 mg daily and Crestor 40 mg daily  3. HTN - Recently BP at home and at last visit at goal so will continue current medications as above 4. HLD - Continue Praluent, Zetia, Crestor 5. CKDIII - SCr elevated slightly above baseline last week, HH RN to draw another BMET today  - Continue to monitor   Plan: 1) Medication changes: Based on clinical presentation, vital signs and recent labs will continue current regimen 2) Labs: BMET/CBC today with [facility] RN 3) Follow-up: Amy, NP-C on 07/31/16  Ruta Hinds. Velva Harman, PharmD, BCPS, CPP Clinical Pharmacist Pager: [phone] Phone: [phone] 07/26/2016 2:41 PM

## 2016-07-27 ENCOUNTER — Ambulatory Visit (INDEPENDENT_AMBULATORY_CARE_PROVIDER_SITE_OTHER): Payer: Medicare Other | Admitting: *Deleted

## 2016-07-27 DIAGNOSIS — I255 Ischemic cardiomyopathy: Secondary | ICD-10-CM

## 2016-07-27 NOTE — Progress Notes (Signed)
Remote ICD transmission.   

## 2016-07-31 ENCOUNTER — Ambulatory Visit (HOSPITAL_COMMUNITY)
Admission: RE | Admit: 2016-07-31 | Discharge: 2016-07-31 | Disposition: A | Discharge status: Home / Self Care | Payer: Medicare Other | Source: Ambulatory Visit | Attending: Cardiology | Admitting: Cardiology

## 2016-07-31 VITALS — BP 138/84 | HR 83 | Wt 150.2 lb

## 2016-07-31 DIAGNOSIS — Z809 Family history of malignant neoplasm, unspecified: Secondary | ICD-10-CM | POA: Diagnosis not present

## 2016-07-31 DIAGNOSIS — I2510 Atherosclerotic heart disease of native coronary artery without angina pectoris: Secondary | ICD-10-CM | POA: Diagnosis not present

## 2016-07-31 DIAGNOSIS — Z955 Presence of coronary angioplasty implant and graft: Secondary | ICD-10-CM | POA: Insufficient documentation

## 2016-07-31 DIAGNOSIS — Z8249 Family history of ischemic heart disease and other diseases of the circulatory system: Secondary | ICD-10-CM | POA: Insufficient documentation

## 2016-07-31 DIAGNOSIS — Z79899 Other long term (current) drug therapy: Secondary | ICD-10-CM | POA: Diagnosis not present

## 2016-07-31 DIAGNOSIS — I5023 Acute on chronic systolic (congestive) heart failure: Secondary | ICD-10-CM | POA: Diagnosis not present

## 2016-07-31 DIAGNOSIS — I5042 Chronic combined systolic (congestive) and diastolic (congestive) heart failure: Secondary | ICD-10-CM | POA: Diagnosis not present

## 2016-07-31 DIAGNOSIS — M109 Gout, unspecified: Secondary | ICD-10-CM | POA: Insufficient documentation

## 2016-07-31 DIAGNOSIS — I10 Essential (primary) hypertension: Secondary | ICD-10-CM

## 2016-07-31 DIAGNOSIS — E785 Hyperlipidemia, unspecified: Secondary | ICD-10-CM | POA: Diagnosis not present

## 2016-07-31 DIAGNOSIS — I255 Ischemic cardiomyopathy: Secondary | ICD-10-CM | POA: Diagnosis not present

## 2016-07-31 DIAGNOSIS — I252 Old myocardial infarction: Secondary | ICD-10-CM | POA: Diagnosis not present

## 2016-07-31 DIAGNOSIS — Z95810 Presence of automatic (implantable) cardiac defibrillator: Secondary | ICD-10-CM

## 2016-07-31 DIAGNOSIS — N183 Chronic kidney disease, stage 3 (moderate): Secondary | ICD-10-CM | POA: Diagnosis not present

## 2016-07-31 DIAGNOSIS — Z7982 Long term (current) use of aspirin: Secondary | ICD-10-CM | POA: Insufficient documentation

## 2016-07-31 DIAGNOSIS — I130 Hypertensive heart and chronic kidney disease with heart failure and stage 1 through stage 4 chronic kidney disease, or unspecified chronic kidney disease: Secondary | ICD-10-CM | POA: Insufficient documentation

## 2016-07-31 NOTE — Progress Notes (Signed)
Patient ID: Marvin Martin, male   DOB: 1965/10/16, 51 y.o.   MRN: [id number]    Advanced Heart Failure Clinic Note   Primary Care: Dr Laney Pastor Primary Cardiologist: Dr. Haroldine Laws  Nephrologist: Dr Joelyn Oms   HPI: Marvin Martin is a 51 year old male with history of CAD, ICM, MI X4  with 17 stents, chronic systolic HF with EF 16% s/p St. Jude ICD, HTN, gout, and hyperlipidemia.  He just relocated to [facility] from [place]. His medical care was provided by [facility]. He has struggled with in-stent restenosis. Most recent heart cath was February 2016 with stent placed.   Admitted May 8th through May 12th, 2017 with chest pain. Had LHC as noted below no intervention.   Admitted June 5th through June 14th, 0737 with A/C systolic CHF. Had repeat R/LHC as below with stable CAD and low output. Started on milrinone and weaned to 0.125 mcg/kg/min for d/c. Coreg, lasix, and spiro stopped.  Entresto dropped from full dose to 24/26 mg, subsequently stopped.   Today he returns for HF follow up. Overall feeling pretty good. No problems with PICC. Continues on milrinone. Denies SOB/PND/Orthopnea. No chest pain. Appetite ok. No fever or chills. Taking all medications.   01/2015 ECHO  EF 20-25% RV normal  03/21/15: EF 15-20% RV ok  03/2016: Echo EF 15-20%.   [facility]: fluid ok.  R/LHC 06/23/15  Dist RCA-1 lesion, 100% stenosed. The lesion was previously treated with a stent (unknown type).  Dist RCA-2 lesion, 100% stenosed. The lesion was previously treated with a stent (unknown type).  Prox RCA lesion, 70% stenosed. The lesion was previously treated with a stent (unknown type) and angioplasty.  Mid RCA lesion, 50% stenosed. The lesion was previously treated with a stent (unknown type).  Ost RPDA to RPDA lesion, 75% stenosed. The lesion was previously treated with a drug-eluting stent.  Mid LAD lesion, 70% stenosed.  Ost 2nd Diag lesion, 70% stenosed. Ao = 87/62 (71) LV = 81/9 RA = 2 RV =  20/0/2 PA = 24/12 (16) PCW = 4 Fick cardiac output/index = 3.1/1.8 Thermo CO/CI = 3.0/1.7 PVR = 3.9 WU Ao sat = 97% PA sat = 54%, 55%  R/LHC 05/27/15  Dist RCA-1 lesion, 100% stenosed. The lesion was previously treated with a stent (unknown type).  Dist RCA-2 lesion, 100% stenosed. The lesion was previously treated with a stent (unknown type).  Prox RCA lesion, 60% stenosed. The lesion was previously treated with a stent (unknown type) and angioplasty.  Mid RCA lesion, 40% stenosed. The lesion was previously treated with a stent (unknown type). Findings: RA = 1 RV = 22/1/5 PA = 27/13 (19) PCW = 6 Ao = 120/78 (96) LV = 124/4/13 Fick cardiac output/index = 3.3/1.9 PVR = 4.0 WU SVR = 2335  FA sat = 94% PA sat = 60%. 60%  CPX 01/15/15 FVC 2.67 (65%)    FEV1 2.18 (64%)     FEV1/FVC 81 (100%)     MVV 105 (73%) Peak VO2: 20.3 (56.3% predicted peak VO2) VE/VCO2 slope: 26.8 OUES: 1.52 Peak RER: 1.21  CPX 06/2015  Peak VO2: 19.9 (55.8% predicted peak VO2) VE/VCO2 slope: 31.6 OUES: 1.42 Peak RER: 1.26  Labs: 05/08/2016: Creatinine 2.44 K 4.8   SH: Separated from his wife. Has 4 children. 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         Father deceased but he is unsure of the cause. He had hyperlipidemia.  Sister: Heart disease  [facility]  [address] [place] [address] [phone]   Past Medical History:  Diagnosis Date  . Chronic systolic CHF (congestive heart failure) ([place])    a. 01/2015 Echo: EF 20-25%, antlat, inflat AK.  [name] CKD (chronic kidney disease), stage III   . Coronary artery disease    a. s/p MI x 4;  b. Reported h/o 17 stents with recurrent ISR;  c. 02/2014 Cath ([place]): PCI to unknown vessel;  d. 01/2015 MV: EF 18%, large scar, no ischemia; e. 03/2015 PCI: LM nl, LAD nl, D2 patent stent, RI patent stent, LCX patent stents p/m, RCA 95p ISR (PTCA), 35m, 100d into RPL CTO, RPDA 90 (2.5x16  Synergy DES).  . Hyperlipidemia   . Hypertensive heart disease   . Ischemic cardiomyopathy    a. s/p SJM ICD 2007 w/ gen change in 2012; b. 12/2014 CPX Test: mild to mod HF limitation w/ pVO2 20.3 and nl slope;  c. 01/2015 Echo: EF 20-25%.    Current Outpatient Prescriptions  Medication Sig Dispense Refill  . allopurinol (ZYLOPRIM) 100 MG tablet Take 100 mg by mouth 2 (two) times daily.     . AMBULATORY NON FORMULARY MEDICATION Inject 0.125 mcg/kg/min into the vein continuous. Medication Name: Milrinone    . amLODipine (NORVASC) 10 MG tablet TAKE 1 TABLET (10 MG TOTAL) BY MOUTH DAILY. 30 tablet 11  . aspirin 81 MG tablet Take 81 mg by mouth daily.    . cholecalciferol (VITAMIN D) 1000 UNITS tablet Take 1,000 Units by mouth daily.    [name] ENTRESTO 24-26 MG Take 1 tablet by mouth 2 (two) times daily. 60 tablet 6  . ezetimibe (ZETIA) 10 MG tablet TAKE 1 TABLET BY MOUTH EVERY DAY 90 tablet 0  . hydrALAZINE (APRESOLINE) 100 MG tablet Take 1 tablet (100 mg total) by mouth 3 (three) times daily. 270 tablet 3  . isosorbide mononitrate (IMDUR) 120 MG 24 hr tablet Take 1 tablet (120 mg total) by mouth daily. 30 tablet 5  . ivabradine (CORLANOR) 5 MG TABS tablet Take 1 tablet (5 mg total) by mouth 2 (two) times daily with a meal. 60 tablet 3  . Magnesium Oxide 200 MG TABS Take 1 tablet (200 mg total) by mouth 2 (two) times daily. 180 tablet 3  . omega-3 acid ethyl esters (LOVAZA) 1 G capsule Take 1 g by mouth daily.     . ondansetron (ZOFRAN) 4 MG tablet Take 4 mg by mouth every 8 (eight) hours as needed for nausea or vomiting. Reported on 05/24/2015    . PRALUENT 75 MG/ML SOPN INJECT 75MG  (1 PEN) SUBCUTANEOUSLY EVERY 14 DAYS 2 pen 11  . rosuvastatin (CRESTOR) 40 MG tablet Take 1 tablet (40 mg total) by mouth daily. 90 tablet 3  . ticagrelor (BRILINTA) 90 MG TABS tablet Take 1 tablet (90 mg total) by mouth 2 (two) times daily. 180 tablet 1  . nitroGLYCERIN (NITROSTAT) 0.4 MG SL tablet Place 1 tablet (0.4 mg  total) under the tongue every 5 (five) minutes as needed for chest pain. Reported on 04/12/2015 (Patient not taking: Reported on 07/31/2016) 60 tablet 1   No current facility-administered medications for this encounter.     No Known Allergies    Social History   Social History  . Marital status: Legally Separated    Spouse name: N/A  . Number of children: N/A  . Years of education: N/A   Occupational History  . Not on file.  Social History Main Topics  . Smoking status: Never Smoker  . Smokeless tobacco: Not on file  . Alcohol use No  . Drug use: No  . Sexual activity: No   Other Topics Concern  . Not on file   Social History Narrative   Moved to [place] from [place] in 2016.  Lives alone in [place].      Family History  Problem Relation Age of Onset  . Cancer Mother        died when pt was 89.  She had a h/o heart dzs as well.  . Hypertension Mother   . Hyperlipidemia Father        Father died when pt was age 19 - he believes that he had a cardiac hx.  [name] CAD Sister     Vitals:   07/31/16 0920  BP: 138/84  Pulse: 83  SpO2: 99%  Weight: 150 lb 3.2 oz (68.1 kg)   Wt Readings from Last 3 Encounters:  07/31/16 150 lb 3.2 oz (68.1 kg)  07/26/16 150 lb 6.4 oz (68.2 kg)  06/19/16 151 lb 12.8 oz (68.9 kg)     PHYSICAL EXAM: General:  Well appearing. No resp difficulty. Ambulated in the clinic without difficulty.  HEENT: normal Neck: supple. no JVD. Carotids 2+ bilat; no bruits. No lymphadenopathy or thryomegaly appreciated. Cor: PMI nondisplaced. Regular rate & rhythm. No rubs, gallops or murmurs. Lungs: clear Abdomen: soft, nontender, nondistended. No hepatosplenomegaly. No bruits or masses. Good bowel sounds. Extremities: no cyanosis, clubbing, rash, edema. RUE PICC nontender/no erythema Neuro: alert & orientedx3, cranial nerves grossly intact. moves all 4 extremities w/o difficulty. Affect pleasant   ASSESSMENT & PLAN: 1. Chronic Systolic Heart Failure - ICM.  EF  15-20% on echo 3/18. RV ok. Has ST Jude ICD - NYHA II on chronic milrinone 0.25 mcg via PICC. AHC following weekly.  . Volume status stable. He does not require diuretics.  -No bb with low output. - Continue corlanor 5 mg twice a day. Heart rate ok.  - Continue entresto 24-26 mg twice a day. Will not up titrate with CKD.  - Continue current dose of hydralazine and imdur.  - Not a candidate for transplant/advanced therapies due to lack of social support. Also his renal function prohibits VAD.  2. CAD- MI X4 17 stents  Most recent [place] 06/2015 - no intervention continued medical management.    Dist RCA-1 lesion, 100% stenosed. The lesion was previously treated with a stent (unknown type).  Dist RCA-2 lesion, 100% stenosed. The lesion was previously treated with a stent (unknown type).  Prox RCA lesion, 70% stenosed. The lesion was previously treated with a stent (unknown type) and angioplasty.  Mid RCA lesion, 50% stenosed. The lesion was previously treated with a stent (unknown type).  Ost RPDA to RPDA lesion, 75% stenosed. The lesion was previously treated with a drug-eluting stent.  Mid LAD lesion, 70% stenosed.  Ost 2nd Diag lesion, 70% stenosed. - Not on beta blocker with low output on home milrinone.  On statin. No bb with low output. No CP.  3. HTN Stable continue current regimen.  4. Hyperlipidemia - On Praulent and statin. Follows with lipid clinic.   5. CKD stage III  Creatinine baseline ~2.7. He has follow up with Nephrology next month.  6. Palpitations- resolved.    Follow up 2 months. Next visit will ask HFSW to meet with him to discuss [place]. He understands milrinone will not last forever.  Jeevan Kalla NP-C  9:29 AM

## 2016-07-31 NOTE — Patient Instructions (Signed)
No lab work at this time.  No changes in medication at this time.  Follow up 2 months with Amy Clegg NP-C.  Take all medication as prescribed the day of your appointment. Bring all medications with you to your appointment.  Do the following things EVERYDAY: 1) Weigh yourself in the morning before breakfast. Write it down and keep it in a log. 2) Take your medicines as prescribed 3) Eat low salt foods-Limit salt (sodium) to 2000 mg per day.  4) Stay as active as you can everyday 5) Limit all fluids for the day to less than 2 liters

## 2016-08-01 ENCOUNTER — Encounter: Payer: Self-pay | Admitting: Cardiology

## 2016-08-01 ENCOUNTER — Other Ambulatory Visit (HOSPITAL_COMMUNITY): Payer: Self-pay | Admitting: Internal Medicine

## 2016-08-02 DIAGNOSIS — I25110 Atherosclerotic heart disease of native coronary artery with unstable angina pectoris: Secondary | ICD-10-CM | POA: Diagnosis not present

## 2016-08-02 DIAGNOSIS — I5023 Acute on chronic systolic (congestive) heart failure: Secondary | ICD-10-CM | POA: Diagnosis not present

## 2016-08-02 DIAGNOSIS — I255 Ischemic cardiomyopathy: Secondary | ICD-10-CM | POA: Diagnosis not present

## 2016-08-02 DIAGNOSIS — I252 Old myocardial infarction: Secondary | ICD-10-CM | POA: Diagnosis not present

## 2016-08-02 DIAGNOSIS — N183 Chronic kidney disease, stage 3 (moderate): Secondary | ICD-10-CM | POA: Diagnosis not present

## 2016-08-02 DIAGNOSIS — I130 Hypertensive heart and chronic kidney disease with heart failure and stage 1 through stage 4 chronic kidney disease, or unspecified chronic kidney disease: Secondary | ICD-10-CM | POA: Diagnosis not present

## 2016-08-08 ENCOUNTER — Other Ambulatory Visit (HOSPITAL_COMMUNITY): Payer: Self-pay | Admitting: Internal Medicine

## 2016-08-09 DIAGNOSIS — I25110 Atherosclerotic heart disease of native coronary artery with unstable angina pectoris: Secondary | ICD-10-CM | POA: Diagnosis not present

## 2016-08-09 DIAGNOSIS — I5023 Acute on chronic systolic (congestive) heart failure: Secondary | ICD-10-CM | POA: Diagnosis not present

## 2016-08-09 DIAGNOSIS — I252 Old myocardial infarction: Secondary | ICD-10-CM | POA: Diagnosis not present

## 2016-08-09 DIAGNOSIS — N183 Chronic kidney disease, stage 3 (moderate): Secondary | ICD-10-CM | POA: Diagnosis not present

## 2016-08-09 DIAGNOSIS — I255 Ischemic cardiomyopathy: Secondary | ICD-10-CM | POA: Diagnosis not present

## 2016-08-09 DIAGNOSIS — I130 Hypertensive heart and chronic kidney disease with heart failure and stage 1 through stage 4 chronic kidney disease, or unspecified chronic kidney disease: Secondary | ICD-10-CM | POA: Diagnosis not present

## 2016-08-15 ENCOUNTER — Other Ambulatory Visit: Payer: Self-pay

## 2016-08-16 DIAGNOSIS — I5023 Acute on chronic systolic (congestive) heart failure: Secondary | ICD-10-CM | POA: Diagnosis not present

## 2016-08-16 DIAGNOSIS — N183 Chronic kidney disease, stage 3 (moderate): Secondary | ICD-10-CM | POA: Diagnosis not present

## 2016-08-16 DIAGNOSIS — I130 Hypertensive heart and chronic kidney disease with heart failure and stage 1 through stage 4 chronic kidney disease, or unspecified chronic kidney disease: Secondary | ICD-10-CM | POA: Diagnosis not present

## 2016-08-16 DIAGNOSIS — I25110 Atherosclerotic heart disease of native coronary artery with unstable angina pectoris: Secondary | ICD-10-CM | POA: Diagnosis not present

## 2016-08-16 DIAGNOSIS — I252 Old myocardial infarction: Secondary | ICD-10-CM | POA: Diagnosis not present

## 2016-08-16 DIAGNOSIS — I255 Ischemic cardiomyopathy: Secondary | ICD-10-CM | POA: Diagnosis not present

## 2016-08-17 ENCOUNTER — Telehealth: Payer: Self-pay | Admitting: Pharmacist

## 2016-08-17 ENCOUNTER — Other Ambulatory Visit (HOSPITAL_COMMUNITY): Payer: Self-pay | Admitting: Internal Medicine

## 2016-08-17 DIAGNOSIS — E785 Hyperlipidemia, unspecified: Secondary | ICD-10-CM

## 2016-08-17 NOTE — Telephone Encounter (Signed)
Received request for new prior authorization for Praluent. Pt has not had lipid panel checked in the past year. Called pt and scheduled lipid panel for next Monday, will submit new PA at that time for continued Praluent therapy.

## 2016-08-21 ENCOUNTER — Other Ambulatory Visit: Payer: Medicare Other | Admitting: *Deleted

## 2016-08-21 DIAGNOSIS — E785 Hyperlipidemia, unspecified: Secondary | ICD-10-CM | POA: Diagnosis not present

## 2016-08-21 LAB — LIPID PANEL
CHOL/HDL RATIO: 1.9 ratio (ref 0.0–5.0)
CHOLESTEROL TOTAL: 88 mg/dL — AB (ref 100–199)
HDL: 47 mg/dL (ref >39)
LDL CALC: 23 mg/dL (ref 0–99)
TRIGLYCERIDES: 92 mg/dL (ref 0–149)
VLDL Cholesterol Cal: 18 mg/dL (ref 5–40)

## 2016-08-21 LAB — HEPATIC FUNCTION PANEL
ALT: 57 IU/L — AB (ref 0–44)
AST: 40 IU/L (ref 0–40)
Albumin: 4.4 g/dL (ref 3.5–5.5)
Alkaline Phosphatase: 73 IU/L (ref 39–117)
BILIRUBIN TOTAL: 0.4 mg/dL (ref 0.0–1.2)
Bilirubin, Direct: 0.14 mg/dL (ref 0.00–0.40)
TOTAL PROTEIN: 7.6 g/dL (ref 6.0–8.5)

## 2016-08-23 ENCOUNTER — Telehealth (HOSPITAL_COMMUNITY): Payer: Self-pay | Admitting: Pharmacist

## 2016-08-23 DIAGNOSIS — I255 Ischemic cardiomyopathy: Secondary | ICD-10-CM | POA: Diagnosis not present

## 2016-08-23 DIAGNOSIS — N183 Chronic kidney disease, stage 3 (moderate): Secondary | ICD-10-CM | POA: Diagnosis not present

## 2016-08-23 DIAGNOSIS — I5023 Acute on chronic systolic (congestive) heart failure: Secondary | ICD-10-CM | POA: Diagnosis not present

## 2016-08-23 DIAGNOSIS — I252 Old myocardial infarction: Secondary | ICD-10-CM | POA: Diagnosis not present

## 2016-08-23 DIAGNOSIS — I130 Hypertensive heart and chronic kidney disease with heart failure and stage 1 through stage 4 chronic kidney disease, or unspecified chronic kidney disease: Secondary | ICD-10-CM | POA: Diagnosis not present

## 2016-08-23 DIAGNOSIS — I25110 Atherosclerotic heart disease of native coronary artery with unstable angina pectoris: Secondary | ICD-10-CM | POA: Diagnosis not present

## 2016-08-23 NOTE — Telephone Encounter (Signed)
Praluent PA approved by [employer] D from 08/16/16 through 08/23/17.   Ruta Hinds. Velva Harman, PharmD, BCPS, CPP Clinical Pharmacist Pager: [phone] Phone: [phone] 08/23/2016 11:57 AM

## 2016-08-24 DIAGNOSIS — I130 Hypertensive heart and chronic kidney disease with heart failure and stage 1 through stage 4 chronic kidney disease, or unspecified chronic kidney disease: Secondary | ICD-10-CM | POA: Diagnosis not present

## 2016-08-24 DIAGNOSIS — Z955 Presence of coronary angioplasty implant and graft: Secondary | ICD-10-CM | POA: Diagnosis not present

## 2016-08-24 DIAGNOSIS — Z5181 Encounter for therapeutic drug level monitoring: Secondary | ICD-10-CM | POA: Diagnosis not present

## 2016-08-24 DIAGNOSIS — E785 Hyperlipidemia, unspecified: Secondary | ICD-10-CM | POA: Diagnosis not present

## 2016-08-24 DIAGNOSIS — I5023 Acute on chronic systolic (congestive) heart failure: Secondary | ICD-10-CM | POA: Diagnosis not present

## 2016-08-24 DIAGNOSIS — N183 Chronic kidney disease, stage 3 (moderate): Secondary | ICD-10-CM | POA: Diagnosis not present

## 2016-08-24 DIAGNOSIS — I252 Old myocardial infarction: Secondary | ICD-10-CM | POA: Diagnosis not present

## 2016-08-24 DIAGNOSIS — Z950 Presence of cardiac pacemaker: Secondary | ICD-10-CM | POA: Diagnosis not present

## 2016-08-24 DIAGNOSIS — I255 Ischemic cardiomyopathy: Secondary | ICD-10-CM | POA: Diagnosis not present

## 2016-08-24 DIAGNOSIS — Z7901 Long term (current) use of anticoagulants: Secondary | ICD-10-CM | POA: Diagnosis not present

## 2016-08-24 DIAGNOSIS — Z452 Encounter for adjustment and management of vascular access device: Secondary | ICD-10-CM | POA: Diagnosis not present

## 2016-08-24 DIAGNOSIS — I25110 Atherosclerotic heart disease of native coronary artery with unstable angina pectoris: Secondary | ICD-10-CM | POA: Diagnosis not present

## 2016-08-25 ENCOUNTER — Other Ambulatory Visit (HOSPITAL_COMMUNITY): Payer: Self-pay | Admitting: Internal Medicine

## 2016-08-25 DIAGNOSIS — N2581 Secondary hyperparathyroidism of renal origin: Secondary | ICD-10-CM | POA: Diagnosis not present

## 2016-08-25 DIAGNOSIS — N183 Chronic kidney disease, stage 3 (moderate): Secondary | ICD-10-CM | POA: Diagnosis not present

## 2016-08-25 DIAGNOSIS — I10 Essential (primary) hypertension: Secondary | ICD-10-CM | POA: Diagnosis not present

## 2016-08-25 DIAGNOSIS — I509 Heart failure, unspecified: Secondary | ICD-10-CM | POA: Diagnosis not present

## 2016-08-25 LAB — CUP PACEART REMOTE DEVICE CHECK
Battery Remaining Longevity: 33 mo
Battery Voltage: 2.89 V
Brady Statistic AS VP Percent: 1 %
Brady Statistic AS VS Percent: 97 %
Date Time Interrogation Session: 20180712070343
HIGH POWER IMPEDANCE MEASURED VALUE: 62 Ohm
Implantable Lead Implant Date: 20121107
Implantable Lead Location: 753860
Implantable Pulse Generator Implant Date: 20121107
Lead Channel Pacing Threshold Amplitude: 0.75 V
Lead Channel Sensing Intrinsic Amplitude: 4.3 mV
Lead Channel Setting Sensing Sensitivity: 0.5 mV
MDC IDC LEAD IMPLANT DT: 20121107
MDC IDC LEAD LOCATION: 753859
MDC IDC MSMT BATTERY REMAINING PERCENTAGE: 38 %
MDC IDC MSMT LEADCHNL RA IMPEDANCE VALUE: 300 Ohm
MDC IDC MSMT LEADCHNL RA PACING THRESHOLD PULSEWIDTH: 0.5 ms
MDC IDC MSMT LEADCHNL RV IMPEDANCE VALUE: 410 Ohm
MDC IDC MSMT LEADCHNL RV PACING THRESHOLD AMPLITUDE: 1.25 V
MDC IDC MSMT LEADCHNL RV PACING THRESHOLD PULSEWIDTH: 0.7 ms
MDC IDC MSMT LEADCHNL RV SENSING INTR AMPL: 11.5 mV
MDC IDC SET LEADCHNL RA PACING AMPLITUDE: 2 V
MDC IDC SET LEADCHNL RV PACING AMPLITUDE: 2.5 V
MDC IDC SET LEADCHNL RV PACING PULSEWIDTH: 0.7 ms
MDC IDC STAT BRADY AP VP PERCENT: 1 %
MDC IDC STAT BRADY AP VS PERCENT: 1 %
MDC IDC STAT BRADY RA PERCENT PACED: 1 %
MDC IDC STAT BRADY RV PERCENT PACED: 1 %
Pulse Gen Serial Number: 1033795

## 2016-08-30 ENCOUNTER — Other Ambulatory Visit (HOSPITAL_COMMUNITY): Payer: Self-pay | Admitting: Internal Medicine

## 2016-08-30 ENCOUNTER — Encounter: Payer: Self-pay | Admitting: Internal Medicine

## 2016-08-30 DIAGNOSIS — I255 Ischemic cardiomyopathy: Secondary | ICD-10-CM | POA: Diagnosis not present

## 2016-08-30 DIAGNOSIS — I130 Hypertensive heart and chronic kidney disease with heart failure and stage 1 through stage 4 chronic kidney disease, or unspecified chronic kidney disease: Secondary | ICD-10-CM | POA: Diagnosis not present

## 2016-08-30 DIAGNOSIS — I252 Old myocardial infarction: Secondary | ICD-10-CM | POA: Diagnosis not present

## 2016-08-30 DIAGNOSIS — I25110 Atherosclerotic heart disease of native coronary artery with unstable angina pectoris: Secondary | ICD-10-CM | POA: Diagnosis not present

## 2016-08-30 DIAGNOSIS — I5023 Acute on chronic systolic (congestive) heart failure: Secondary | ICD-10-CM | POA: Diagnosis not present

## 2016-08-30 DIAGNOSIS — N183 Chronic kidney disease, stage 3 (moderate): Secondary | ICD-10-CM | POA: Diagnosis not present

## 2016-09-06 DIAGNOSIS — N183 Chronic kidney disease, stage 3 (moderate): Secondary | ICD-10-CM | POA: Diagnosis not present

## 2016-09-06 DIAGNOSIS — I25110 Atherosclerotic heart disease of native coronary artery with unstable angina pectoris: Secondary | ICD-10-CM | POA: Diagnosis not present

## 2016-09-06 DIAGNOSIS — I130 Hypertensive heart and chronic kidney disease with heart failure and stage 1 through stage 4 chronic kidney disease, or unspecified chronic kidney disease: Secondary | ICD-10-CM | POA: Diagnosis not present

## 2016-09-06 DIAGNOSIS — I252 Old myocardial infarction: Secondary | ICD-10-CM | POA: Diagnosis not present

## 2016-09-06 DIAGNOSIS — I5023 Acute on chronic systolic (congestive) heart failure: Secondary | ICD-10-CM | POA: Diagnosis not present

## 2016-09-06 DIAGNOSIS — I255 Ischemic cardiomyopathy: Secondary | ICD-10-CM | POA: Diagnosis not present

## 2016-09-10 ENCOUNTER — Other Ambulatory Visit (HOSPITAL_COMMUNITY): Payer: Self-pay | Admitting: Cardiology

## 2016-09-13 DIAGNOSIS — I130 Hypertensive heart and chronic kidney disease with heart failure and stage 1 through stage 4 chronic kidney disease, or unspecified chronic kidney disease: Secondary | ICD-10-CM | POA: Diagnosis not present

## 2016-09-13 DIAGNOSIS — I5023 Acute on chronic systolic (congestive) heart failure: Secondary | ICD-10-CM | POA: Diagnosis not present

## 2016-09-13 DIAGNOSIS — I255 Ischemic cardiomyopathy: Secondary | ICD-10-CM | POA: Diagnosis not present

## 2016-09-13 DIAGNOSIS — I252 Old myocardial infarction: Secondary | ICD-10-CM | POA: Diagnosis not present

## 2016-09-13 DIAGNOSIS — I25110 Atherosclerotic heart disease of native coronary artery with unstable angina pectoris: Secondary | ICD-10-CM | POA: Diagnosis not present

## 2016-09-13 DIAGNOSIS — N183 Chronic kidney disease, stage 3 (moderate): Secondary | ICD-10-CM | POA: Diagnosis not present

## 2016-09-20 DIAGNOSIS — I255 Ischemic cardiomyopathy: Secondary | ICD-10-CM | POA: Diagnosis not present

## 2016-09-20 DIAGNOSIS — N183 Chronic kidney disease, stage 3 (moderate): Secondary | ICD-10-CM | POA: Diagnosis not present

## 2016-09-20 DIAGNOSIS — I252 Old myocardial infarction: Secondary | ICD-10-CM | POA: Diagnosis not present

## 2016-09-20 DIAGNOSIS — I130 Hypertensive heart and chronic kidney disease with heart failure and stage 1 through stage 4 chronic kidney disease, or unspecified chronic kidney disease: Secondary | ICD-10-CM | POA: Diagnosis not present

## 2016-09-20 DIAGNOSIS — I25110 Atherosclerotic heart disease of native coronary artery with unstable angina pectoris: Secondary | ICD-10-CM | POA: Diagnosis not present

## 2016-09-20 DIAGNOSIS — I5023 Acute on chronic systolic (congestive) heart failure: Secondary | ICD-10-CM | POA: Diagnosis not present

## 2016-09-22 ENCOUNTER — Other Ambulatory Visit (HOSPITAL_COMMUNITY): Payer: Self-pay | Admitting: Internal Medicine

## 2016-09-22 ENCOUNTER — Other Ambulatory Visit (HOSPITAL_COMMUNITY): Payer: Self-pay | Admitting: Vascular Surgery

## 2016-09-27 DIAGNOSIS — I255 Ischemic cardiomyopathy: Secondary | ICD-10-CM | POA: Diagnosis not present

## 2016-09-27 DIAGNOSIS — N183 Chronic kidney disease, stage 3 (moderate): Secondary | ICD-10-CM | POA: Diagnosis not present

## 2016-09-27 DIAGNOSIS — I25110 Atherosclerotic heart disease of native coronary artery with unstable angina pectoris: Secondary | ICD-10-CM | POA: Diagnosis not present

## 2016-09-27 DIAGNOSIS — I252 Old myocardial infarction: Secondary | ICD-10-CM | POA: Diagnosis not present

## 2016-09-27 DIAGNOSIS — I5023 Acute on chronic systolic (congestive) heart failure: Secondary | ICD-10-CM | POA: Diagnosis not present

## 2016-09-27 DIAGNOSIS — I130 Hypertensive heart and chronic kidney disease with heart failure and stage 1 through stage 4 chronic kidney disease, or unspecified chronic kidney disease: Secondary | ICD-10-CM | POA: Diagnosis not present

## 2016-09-29 ENCOUNTER — Ambulatory Visit (HOSPITAL_COMMUNITY)
Admission: RE | Admit: 2016-09-29 | Discharge: 2016-09-29 | Disposition: A | Discharge status: Home / Self Care | Payer: Medicare Other | Source: Ambulatory Visit | Attending: Internal Medicine | Admitting: Internal Medicine

## 2016-09-29 VITALS — BP 170/80 | HR 88 | Wt 154.4 lb

## 2016-09-29 DIAGNOSIS — I130 Hypertensive heart and chronic kidney disease with heart failure and stage 1 through stage 4 chronic kidney disease, or unspecified chronic kidney disease: Secondary | ICD-10-CM | POA: Diagnosis not present

## 2016-09-29 DIAGNOSIS — E785 Hyperlipidemia, unspecified: Secondary | ICD-10-CM | POA: Diagnosis not present

## 2016-09-29 DIAGNOSIS — M109 Gout, unspecified: Secondary | ICD-10-CM | POA: Insufficient documentation

## 2016-09-29 DIAGNOSIS — I5042 Chronic combined systolic (congestive) and diastolic (congestive) heart failure: Secondary | ICD-10-CM

## 2016-09-29 DIAGNOSIS — I5023 Acute on chronic systolic (congestive) heart failure: Secondary | ICD-10-CM | POA: Insufficient documentation

## 2016-09-29 DIAGNOSIS — I10 Essential (primary) hypertension: Secondary | ICD-10-CM | POA: Diagnosis not present

## 2016-09-29 DIAGNOSIS — I2510 Atherosclerotic heart disease of native coronary artery without angina pectoris: Secondary | ICD-10-CM | POA: Diagnosis not present

## 2016-09-29 DIAGNOSIS — Z809 Family history of malignant neoplasm, unspecified: Secondary | ICD-10-CM | POA: Diagnosis not present

## 2016-09-29 DIAGNOSIS — I252 Old myocardial infarction: Secondary | ICD-10-CM | POA: Insufficient documentation

## 2016-09-29 DIAGNOSIS — Z8249 Family history of ischemic heart disease and other diseases of the circulatory system: Secondary | ICD-10-CM | POA: Insufficient documentation

## 2016-09-29 DIAGNOSIS — I255 Ischemic cardiomyopathy: Secondary | ICD-10-CM | POA: Insufficient documentation

## 2016-09-29 DIAGNOSIS — Z955 Presence of coronary angioplasty implant and graft: Secondary | ICD-10-CM | POA: Insufficient documentation

## 2016-09-29 DIAGNOSIS — N183 Chronic kidney disease, stage 3 unspecified: Secondary | ICD-10-CM

## 2016-09-29 DIAGNOSIS — Z7982 Long term (current) use of aspirin: Secondary | ICD-10-CM | POA: Insufficient documentation

## 2016-09-29 NOTE — Progress Notes (Signed)
Patient ID: Marvin Martin, male   DOB: 03-03-1965, 51 y.o.   MRN: [id number]    Advanced Heart Failure Clinic Note   Primary Care: Dr Laney Pastor Primary Cardiologist: Dr. Haroldine Laws  Nephrologist: Dr Joelyn Oms   HPI: Marvin Martin is a 51 year old male with history of CAD, ICM, MI X4  with 17 stents, chronic systolic HF with EF 57% s/p St. Jude ICD, HTN, gout, and hyperlipidemia.  He just relocated to [facility] from [place]. His medical care was provided by [facility]. He has struggled with in-stent restenosis. Most recent heart cath was February 2016 with stent placed.   Admitted May 8th through May 12th, 2017 with chest pain. Had LHC as noted below no intervention.   Admitted June 5th through June 14th, 3220 with A/C systolic CHF. Had repeat R/LHC as below with stable CAD and low output. Started on milrinone and weaned to 0.125 mcg/kg/min for d/c. Coreg, lasix, and spiro stopped.  Entresto dropped from full dose to 24/26 mg, subsequently stopped.   Today he returns for HF follow up. Overall feeling great. Denies SOB/PND/Orthopnea. No chest pain. Just took meds prior to visit. No fever or chills. Ambulating around the grocery store without difficulty. Followed by [facility] for home milrinone.   01/2015 ECHO  EF 20-25% RV normal  03/21/15: EF 15-20% RV ok  03/2016: Echo EF 15-20%.   Corevue: fluid ok.  R/LHC 06/23/15  Dist RCA-1 lesion, 100% stenosed. The lesion was previously treated with a stent (unknown type).  Dist RCA-2 lesion, 100% stenosed. The lesion was previously treated with a stent (unknown type).  Prox RCA lesion, 70% stenosed. The lesion was previously treated with a stent (unknown type) and angioplasty.  Mid RCA lesion, 50% stenosed. The lesion was previously treated with a stent (unknown type).  Ost RPDA to RPDA lesion, 75% stenosed. The lesion was previously treated with a drug-eluting stent.  Mid LAD lesion, 70% stenosed.  Ost 2nd Diag lesion, 70% stenosed. Ao = 87/62  (71) LV = 81/9 RA = 2 RV = 20/0/2 PA = 24/12 (16) PCW = 4 Fick cardiac output/index = 3.1/1.8 Thermo CO/CI = 3.0/1.7 PVR = 3.9 WU Ao sat = 97% PA sat = 54%, 55%  R/LHC 05/27/15  Dist RCA-1 lesion, 100% stenosed. The lesion was previously treated with a stent (unknown type).  Dist RCA-2 lesion, 100% stenosed. The lesion was previously treated with a stent (unknown type).  Prox RCA lesion, 60% stenosed. The lesion was previously treated with a stent (unknown type) and angioplasty.  Mid RCA lesion, 40% stenosed. The lesion was previously treated with a stent (unknown type). Findings: RA = 1 RV = 22/1/5 PA = 27/13 (19) PCW = 6 Ao = 120/78 (96) LV = 124/4/13 Fick cardiac output/index = 3.3/1.9 PVR = 4.0 WU SVR = 2335  FA sat = 94% PA sat = 60%. 60%  CPX 01/15/15 FVC 2.67 (65%)    FEV1 2.18 (64%)     FEV1/FVC 81 (100%)     MVV 105 (73%) Peak VO2: 20.3 (56.3% predicted peak VO2) VE/VCO2 slope: 26.8 OUES: 1.52 Peak RER: 1.21  CPX 06/2015  Peak VO2: 19.9 (55.8% predicted peak VO2) VE/VCO2 slope: 31.6 OUES: 1.42 Peak RER: 1.26  Labs: 05/08/2016: Creatinine 2.44 K 4.8   SH: Separated from his wife. Has 4 children. 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         Father deceased but he is unsure of  the cause. He had hyperlipidemia.         Sister: Heart disease  [facility]  [address] [place] [address] [phone]   Past Medical History:  Diagnosis Date  . Chronic systolic CHF (congestive heart failure) ([place])    a. 01/2015 Echo: EF 20-25%, antlat, inflat AK.  [name] CKD (chronic kidney disease), stage III   . Coronary artery disease    a. s/p MI x 4;  b. Reported h/o 17 stents with recurrent ISR;  c. 02/2014 Cath ([place]): PCI to unknown vessel;  d. 01/2015 MV: EF 18%, large scar, no ischemia; e. 03/2015 PCI: LM nl, LAD nl, D2 patent stent, RI patent stent, LCX patent stents p/m, RCA 95p ISR (PTCA), 62m, 100d  into RPL CTO, RPDA 90 (2.5x16 Synergy DES).  . Hyperlipidemia   . Hypertensive heart disease   . Ischemic cardiomyopathy    a. s/p SJM ICD 2007 w/ gen change in 2012; b. 12/2014 CPX Test: mild to mod HF limitation w/ pVO2 20.3 and nl slope;  c. 01/2015 Echo: EF 20-25%.    Current Outpatient Prescriptions  Medication Sig Dispense Refill  . allopurinol (ZYLOPRIM) 100 MG tablet Take 100 mg by mouth 2 (two) times daily.     . AMBULATORY NON FORMULARY MEDICATION Inject 0.125 mcg/kg/min into the vein continuous. Medication Name: Milrinone    . amLODipine (NORVASC) 10 MG tablet TAKE 1 TABLET (10 MG TOTAL) BY MOUTH DAILY. 30 tablet 11  . aspirin 81 MG tablet Take 81 mg by mouth daily.    [name] BRILINTA 90 MG TABS tablet TAKE 1 TABLET (90 MG TOTAL) BY MOUTH 2 (TWO) TIMES DAILY. 180 tablet 1  . cholecalciferol (VITAMIN D) 1000 UNITS tablet Take 1,000 Units by mouth daily.    [name] ENTRESTO 24-26 MG TAKE 1 TABLET BY MOUTH TWICE A DAY 60 tablet 2  . ezetimibe (ZETIA) 10 MG tablet TAKE 1 TABLET BY MOUTH EVERY DAY 90 tablet 0  . hydrALAZINE (APRESOLINE) 100 MG tablet Take 1 tablet (100 mg total) by mouth 3 (three) times daily. 270 tablet 3  . isosorbide mononitrate (IMDUR) 120 MG 24 hr tablet Take 1 tablet (120 mg total) by mouth daily. 30 tablet 5  . ivabradine (CORLANOR) 5 MG TABS tablet Take 1 tablet (5 mg total) by mouth 2 (two) times daily with a meal. 60 tablet 3  . Magnesium Oxide 200 MG TABS Take 1 tablet (200 mg total) by mouth 2 (two) times daily. 180 tablet 3  . nitroGLYCERIN (NITROSTAT) 0.4 MG SL tablet Place 1 tablet (0.4 mg total) under the tongue every 5 (five) minutes as needed for chest pain. Reported on 04/12/2015 60 tablet 1  . omega-3 acid ethyl esters (LOVAZA) 1 G capsule Take 1 g by mouth daily.     . ondansetron (ZOFRAN) 4 MG tablet Take 4 mg by mouth every 8 (eight) hours as needed for nausea or vomiting. Reported on 05/24/2015    . PRALUENT 75 MG/ML SOPN INJECT 75MG  (1 PEN) SUBCUTANEOUSLY  EVERY 14 DAYS 2 pen 11  . rosuvastatin (CRESTOR) 40 MG tablet TAKE 1 TABLET BY MOUTH DAILY 90 tablet 3   No current facility-administered medications for this encounter.     No Known Allergies    Social History   Social History  . Marital status: Legally Separated    Spouse name: N/A  . Number of children: N/A  . Years of education: N/A   Occupational History  . Not on file.  Social History Main Topics  . Smoking status: Never Smoker  . Smokeless tobacco: Not on file  . Alcohol use No  . Drug use: No  . Sexual activity: No   Other Topics Concern  . Not on file   Social History Narrative   Moved to [place] from [place] in 2016.  Lives alone in [place].      Family History  Problem Relation Age of Onset  . Cancer Mother        died when pt was 34.  She had a h/o heart dzs as well.  . Hypertension Mother   . Hyperlipidemia Father        Father died when pt was age 89 - he believes that he had a cardiac hx.  [name] CAD Sister     Vitals:   09/29/16 0825  BP: (!) 170/80  Pulse: 88  SpO2: 100%  Weight: 154 lb 6.4 oz (70 kg)   Wt Readings from Last 3 Encounters:  09/29/16 154 lb 6.4 oz (70 kg)  07/31/16 150 lb 3.2 oz (68.1 kg)  07/26/16 150 lb 6.4 oz (68.2 kg)     PHYSICAL EXAM: General:  Well appearing. No resp difficulty. Ambulating in the clinic without difficulty HEENT: normal Neck: supple. no JVD. Carotids 2+ bilat; no bruits. No lymphadenopathy or thryomegaly appreciated. Cor: PMI nondisplaced. Regular rate & rhythm. No rubs, gallops or murmurs. Lungs: clear Abdomen: soft, nontender, nondistended. No hepatosplenomegaly. No bruits or masses. Good bowel sounds. Extremities: no cyanosis, clubbing, rash, edema. RUE PICC no erythema  Neuro: alert & orientedx3, cranial nerves grossly intact. moves all 4 extremities w/o difficulty. Affect pleasant  ASSESSMENT & PLAN: 1. Chronic Systolic Heart Failure - ICM.  EF 15-20% on echo 3/18. RV ok. Has ST Jude ICD - NYHA II on  chronic milrinone 0.25 mcg via PICC. AHC following weekly.  .Volume status stable. Has lasix if needed.  -No bb with low output. - Continue corlanor 5 mg twice a day. Heart rate ok.  - Continue entresto 24-26 mg twice a day. Will not up titrate with CKD.  - Continue current dose of hydralazine and imdur.  - Not a candidate for transplant/advanced therapies due to lack of social support. Also his renal function prohibits VAD.  2. CAD- MI X4 17 stents  Most recent [place] 06/2015 - no intervention continued medical management.    Dist RCA-1 lesion, 100% stenosed. The lesion was previously treated with a stent (unknown type).  Dist RCA-2 lesion, 100% stenosed. The lesion was previously treated with a stent (unknown type).  Prox RCA lesion, 70% stenosed. The lesion was previously treated with a stent (unknown type) and angioplasty.  Mid RCA lesion, 50% stenosed. The lesion was previously treated with a stent (unknown type).  Ost RPDA to RPDA lesion, 75% stenosed. The lesion was previously treated with a drug-eluting stent.  Mid LAD lesion, 70% stenosed.  Ost 2nd Diag lesion, 70% stenosed. - Not on beta blocker with low output on home milrinone.  No S/S ischemia. Continue statin.  On statin. No bb with low output. No CP.  3. HTN Elevated but he just took morning meds prior to visit. Stable at home. table continue current regimen.  4. Hyperlipidemia - On Praulent and statin. Follows with lipid clinic.   5. CKD stage III - Creatinine base line 2.7 I reviewed most recent creatinine. Stable form 09/20/2016.  6. Palpitations- resolved.    Follow up in 2 months. He has had PICC line  for about 1 year. Will need to start thinking about placing tunneled PICC. RUE PICC stable.       Amy Clegg NP-C  8:37 AM

## 2016-09-29 NOTE — Patient Instructions (Addendum)
Follow up 2 months with Amy Clegg NP-C. Take all medication as prescribed the day of your appointment. Bring all medications with you to your appointment.  _____________________________________________________________  _____________________________________________________________  Do the following things EVERYDAY: 1) Weigh yourself in the morning before breakfast. Write it down and keep it in a log. 2) Take your medicines as prescribed 3) Eat low salt foods-Limit salt (sodium) to 2000 mg per day.  4) Stay as active as you can everyday 5) Limit all fluids for the day to less than 2 liters

## 2016-10-04 DIAGNOSIS — I252 Old myocardial infarction: Secondary | ICD-10-CM | POA: Diagnosis not present

## 2016-10-04 DIAGNOSIS — I5023 Acute on chronic systolic (congestive) heart failure: Secondary | ICD-10-CM | POA: Diagnosis not present

## 2016-10-04 DIAGNOSIS — I130 Hypertensive heart and chronic kidney disease with heart failure and stage 1 through stage 4 chronic kidney disease, or unspecified chronic kidney disease: Secondary | ICD-10-CM | POA: Diagnosis not present

## 2016-10-04 DIAGNOSIS — I255 Ischemic cardiomyopathy: Secondary | ICD-10-CM | POA: Diagnosis not present

## 2016-10-04 DIAGNOSIS — N183 Chronic kidney disease, stage 3 (moderate): Secondary | ICD-10-CM | POA: Diagnosis not present

## 2016-10-04 DIAGNOSIS — I25110 Atherosclerotic heart disease of native coronary artery with unstable angina pectoris: Secondary | ICD-10-CM | POA: Diagnosis not present

## 2016-10-05 DIAGNOSIS — I252 Old myocardial infarction: Secondary | ICD-10-CM | POA: Diagnosis not present

## 2016-10-05 DIAGNOSIS — I5023 Acute on chronic systolic (congestive) heart failure: Secondary | ICD-10-CM | POA: Diagnosis not present

## 2016-10-05 DIAGNOSIS — I255 Ischemic cardiomyopathy: Secondary | ICD-10-CM | POA: Diagnosis not present

## 2016-10-05 DIAGNOSIS — I130 Hypertensive heart and chronic kidney disease with heart failure and stage 1 through stage 4 chronic kidney disease, or unspecified chronic kidney disease: Secondary | ICD-10-CM | POA: Diagnosis not present

## 2016-10-05 DIAGNOSIS — N183 Chronic kidney disease, stage 3 (moderate): Secondary | ICD-10-CM | POA: Diagnosis not present

## 2016-10-05 DIAGNOSIS — I25110 Atherosclerotic heart disease of native coronary artery with unstable angina pectoris: Secondary | ICD-10-CM | POA: Diagnosis not present

## 2016-10-09 ENCOUNTER — Other Ambulatory Visit (HOSPITAL_COMMUNITY): Payer: Self-pay | Admitting: Internal Medicine

## 2016-10-11 DIAGNOSIS — I5023 Acute on chronic systolic (congestive) heart failure: Secondary | ICD-10-CM | POA: Diagnosis not present

## 2016-10-11 DIAGNOSIS — I252 Old myocardial infarction: Secondary | ICD-10-CM | POA: Diagnosis not present

## 2016-10-11 DIAGNOSIS — I130 Hypertensive heart and chronic kidney disease with heart failure and stage 1 through stage 4 chronic kidney disease, or unspecified chronic kidney disease: Secondary | ICD-10-CM | POA: Diagnosis not present

## 2016-10-11 DIAGNOSIS — I255 Ischemic cardiomyopathy: Secondary | ICD-10-CM | POA: Diagnosis not present

## 2016-10-11 DIAGNOSIS — N183 Chronic kidney disease, stage 3 (moderate): Secondary | ICD-10-CM | POA: Diagnosis not present

## 2016-10-11 DIAGNOSIS — I25110 Atherosclerotic heart disease of native coronary artery with unstable angina pectoris: Secondary | ICD-10-CM | POA: Diagnosis not present

## 2016-10-12 ENCOUNTER — Other Ambulatory Visit (HOSPITAL_COMMUNITY): Payer: Self-pay | Admitting: Internal Medicine

## 2016-10-17 DIAGNOSIS — N183 Chronic kidney disease, stage 3 (moderate): Secondary | ICD-10-CM | POA: Diagnosis not present

## 2016-10-17 DIAGNOSIS — I5023 Acute on chronic systolic (congestive) heart failure: Secondary | ICD-10-CM | POA: Diagnosis not present

## 2016-10-18 DIAGNOSIS — I255 Ischemic cardiomyopathy: Secondary | ICD-10-CM | POA: Diagnosis not present

## 2016-10-18 DIAGNOSIS — I252 Old myocardial infarction: Secondary | ICD-10-CM | POA: Diagnosis not present

## 2016-10-18 DIAGNOSIS — I130 Hypertensive heart and chronic kidney disease with heart failure and stage 1 through stage 4 chronic kidney disease, or unspecified chronic kidney disease: Secondary | ICD-10-CM | POA: Diagnosis not present

## 2016-10-18 DIAGNOSIS — I25110 Atherosclerotic heart disease of native coronary artery with unstable angina pectoris: Secondary | ICD-10-CM | POA: Diagnosis not present

## 2016-10-18 DIAGNOSIS — I5023 Acute on chronic systolic (congestive) heart failure: Secondary | ICD-10-CM | POA: Diagnosis not present

## 2016-10-18 DIAGNOSIS — N183 Chronic kidney disease, stage 3 (moderate): Secondary | ICD-10-CM | POA: Diagnosis not present

## 2016-10-20 ENCOUNTER — Other Ambulatory Visit (HOSPITAL_COMMUNITY): Payer: Self-pay | Admitting: Internal Medicine

## 2016-10-20 ENCOUNTER — Other Ambulatory Visit: Payer: Self-pay | Admitting: Internal Medicine

## 2016-10-23 DIAGNOSIS — I25110 Atherosclerotic heart disease of native coronary artery with unstable angina pectoris: Secondary | ICD-10-CM | POA: Diagnosis not present

## 2016-10-23 DIAGNOSIS — I5023 Acute on chronic systolic (congestive) heart failure: Secondary | ICD-10-CM | POA: Diagnosis not present

## 2016-10-23 DIAGNOSIS — I252 Old myocardial infarction: Secondary | ICD-10-CM | POA: Diagnosis not present

## 2016-10-23 DIAGNOSIS — Z452 Encounter for adjustment and management of vascular access device: Secondary | ICD-10-CM | POA: Diagnosis not present

## 2016-10-23 DIAGNOSIS — I255 Ischemic cardiomyopathy: Secondary | ICD-10-CM | POA: Diagnosis not present

## 2016-10-23 DIAGNOSIS — E785 Hyperlipidemia, unspecified: Secondary | ICD-10-CM | POA: Diagnosis not present

## 2016-10-23 DIAGNOSIS — Z5181 Encounter for therapeutic drug level monitoring: Secondary | ICD-10-CM | POA: Diagnosis not present

## 2016-10-23 DIAGNOSIS — N183 Chronic kidney disease, stage 3 (moderate): Secondary | ICD-10-CM | POA: Diagnosis not present

## 2016-10-23 DIAGNOSIS — Z950 Presence of cardiac pacemaker: Secondary | ICD-10-CM | POA: Diagnosis not present

## 2016-10-23 DIAGNOSIS — I130 Hypertensive heart and chronic kidney disease with heart failure and stage 1 through stage 4 chronic kidney disease, or unspecified chronic kidney disease: Secondary | ICD-10-CM | POA: Diagnosis not present

## 2016-10-23 DIAGNOSIS — Z955 Presence of coronary angioplasty implant and graft: Secondary | ICD-10-CM | POA: Diagnosis not present

## 2016-10-23 DIAGNOSIS — Z7901 Long term (current) use of anticoagulants: Secondary | ICD-10-CM | POA: Diagnosis not present

## 2016-10-25 DIAGNOSIS — I252 Old myocardial infarction: Secondary | ICD-10-CM | POA: Diagnosis not present

## 2016-10-25 DIAGNOSIS — I5023 Acute on chronic systolic (congestive) heart failure: Secondary | ICD-10-CM | POA: Diagnosis not present

## 2016-10-25 DIAGNOSIS — I130 Hypertensive heart and chronic kidney disease with heart failure and stage 1 through stage 4 chronic kidney disease, or unspecified chronic kidney disease: Secondary | ICD-10-CM | POA: Diagnosis not present

## 2016-10-25 DIAGNOSIS — I25110 Atherosclerotic heart disease of native coronary artery with unstable angina pectoris: Secondary | ICD-10-CM | POA: Diagnosis not present

## 2016-10-25 DIAGNOSIS — I255 Ischemic cardiomyopathy: Secondary | ICD-10-CM | POA: Diagnosis not present

## 2016-10-25 DIAGNOSIS — N183 Chronic kidney disease, stage 3 (moderate): Secondary | ICD-10-CM | POA: Diagnosis not present

## 2016-10-26 ENCOUNTER — Ambulatory Visit (INDEPENDENT_AMBULATORY_CARE_PROVIDER_SITE_OTHER): Payer: Medicare Other | Admitting: *Deleted

## 2016-10-26 DIAGNOSIS — I255 Ischemic cardiomyopathy: Secondary | ICD-10-CM | POA: Diagnosis not present

## 2016-10-26 NOTE — Progress Notes (Signed)
Remote ICD transmission.   

## 2016-10-27 ENCOUNTER — Encounter: Payer: Self-pay | Admitting: Cardiology

## 2016-10-27 ENCOUNTER — Other Ambulatory Visit (HOSPITAL_COMMUNITY): Payer: Self-pay | Admitting: Internal Medicine

## 2016-11-01 DIAGNOSIS — I255 Ischemic cardiomyopathy: Secondary | ICD-10-CM | POA: Diagnosis not present

## 2016-11-01 DIAGNOSIS — I252 Old myocardial infarction: Secondary | ICD-10-CM | POA: Diagnosis not present

## 2016-11-01 DIAGNOSIS — I5023 Acute on chronic systolic (congestive) heart failure: Secondary | ICD-10-CM | POA: Diagnosis not present

## 2016-11-01 DIAGNOSIS — N183 Chronic kidney disease, stage 3 (moderate): Secondary | ICD-10-CM | POA: Diagnosis not present

## 2016-11-01 DIAGNOSIS — I25110 Atherosclerotic heart disease of native coronary artery with unstable angina pectoris: Secondary | ICD-10-CM | POA: Diagnosis not present

## 2016-11-01 DIAGNOSIS — I130 Hypertensive heart and chronic kidney disease with heart failure and stage 1 through stage 4 chronic kidney disease, or unspecified chronic kidney disease: Secondary | ICD-10-CM | POA: Diagnosis not present

## 2016-11-08 DIAGNOSIS — I5023 Acute on chronic systolic (congestive) heart failure: Secondary | ICD-10-CM | POA: Diagnosis not present

## 2016-11-08 DIAGNOSIS — I25110 Atherosclerotic heart disease of native coronary artery with unstable angina pectoris: Secondary | ICD-10-CM | POA: Diagnosis not present

## 2016-11-08 DIAGNOSIS — I252 Old myocardial infarction: Secondary | ICD-10-CM | POA: Diagnosis not present

## 2016-11-08 DIAGNOSIS — I130 Hypertensive heart and chronic kidney disease with heart failure and stage 1 through stage 4 chronic kidney disease, or unspecified chronic kidney disease: Secondary | ICD-10-CM | POA: Diagnosis not present

## 2016-11-08 DIAGNOSIS — N183 Chronic kidney disease, stage 3 (moderate): Secondary | ICD-10-CM | POA: Diagnosis not present

## 2016-11-08 DIAGNOSIS — I255 Ischemic cardiomyopathy: Secondary | ICD-10-CM | POA: Diagnosis not present

## 2016-11-10 ENCOUNTER — Other Ambulatory Visit: Payer: Self-pay | Admitting: Family Medicine

## 2016-11-14 LAB — CUP PACEART REMOTE DEVICE CHECK
Battery Remaining Longevity: 31 mo
Battery Remaining Percentage: 36 %
Brady Statistic AP VS Percent: 1.5 %
Brady Statistic AS VS Percent: 95 %
Brady Statistic RV Percent Paced: 1 %
HighPow Impedance: 73 Ohm
Implantable Lead Implant Date: 20121107
Implantable Lead Location: 753859
Implantable Pulse Generator Implant Date: 20121107
Lead Channel Impedance Value: 300 Ohm
Lead Channel Pacing Threshold Amplitude: 0.75 V
Lead Channel Pacing Threshold Pulse Width: 0.5 ms
Lead Channel Sensing Intrinsic Amplitude: 11.5 mV
Lead Channel Setting Pacing Amplitude: 2 V
Lead Channel Setting Sensing Sensitivity: 0.5 mV
MDC IDC LEAD IMPLANT DT: 20121107
MDC IDC LEAD LOCATION: 753860
MDC IDC MSMT BATTERY VOLTAGE: 2.87 V
MDC IDC MSMT LEADCHNL RA SENSING INTR AMPL: 5 mV
MDC IDC MSMT LEADCHNL RV IMPEDANCE VALUE: 480 Ohm
MDC IDC MSMT LEADCHNL RV PACING THRESHOLD AMPLITUDE: 1.25 V
MDC IDC MSMT LEADCHNL RV PACING THRESHOLD PULSEWIDTH: 0.7 ms
MDC IDC SESS DTM: 20181011060018
MDC IDC SET LEADCHNL RV PACING AMPLITUDE: 2.5 V
MDC IDC SET LEADCHNL RV PACING PULSEWIDTH: 0.7 ms
MDC IDC STAT BRADY AP VP PERCENT: 1 %
MDC IDC STAT BRADY AS VP PERCENT: 1 %
MDC IDC STAT BRADY RA PERCENT PACED: 1 %
Pulse Gen Serial Number: 1033795

## 2016-11-15 DIAGNOSIS — N183 Chronic kidney disease, stage 3 (moderate): Secondary | ICD-10-CM | POA: Diagnosis not present

## 2016-11-15 DIAGNOSIS — I25110 Atherosclerotic heart disease of native coronary artery with unstable angina pectoris: Secondary | ICD-10-CM | POA: Diagnosis not present

## 2016-11-15 DIAGNOSIS — I252 Old myocardial infarction: Secondary | ICD-10-CM | POA: Diagnosis not present

## 2016-11-15 DIAGNOSIS — I5023 Acute on chronic systolic (congestive) heart failure: Secondary | ICD-10-CM | POA: Diagnosis not present

## 2016-11-15 DIAGNOSIS — I130 Hypertensive heart and chronic kidney disease with heart failure and stage 1 through stage 4 chronic kidney disease, or unspecified chronic kidney disease: Secondary | ICD-10-CM | POA: Diagnosis not present

## 2016-11-15 DIAGNOSIS — I255 Ischemic cardiomyopathy: Secondary | ICD-10-CM | POA: Diagnosis not present

## 2016-11-20 ENCOUNTER — Ambulatory Visit (HOSPITAL_COMMUNITY)
Admission: RE | Admit: 2016-11-20 | Discharge: 2016-11-20 | Disposition: A | Discharge status: Home / Self Care | Payer: Medicare Other | Source: Ambulatory Visit | Attending: Cardiology | Admitting: Cardiology

## 2016-11-20 VITALS — BP 156/98 | HR 93 | Wt 156.8 lb

## 2016-11-20 DIAGNOSIS — E785 Hyperlipidemia, unspecified: Secondary | ICD-10-CM | POA: Diagnosis not present

## 2016-11-20 DIAGNOSIS — I5023 Acute on chronic systolic (congestive) heart failure: Secondary | ICD-10-CM | POA: Diagnosis not present

## 2016-11-20 DIAGNOSIS — I5042 Chronic combined systolic (congestive) and diastolic (congestive) heart failure: Secondary | ICD-10-CM | POA: Diagnosis not present

## 2016-11-20 DIAGNOSIS — I252 Old myocardial infarction: Secondary | ICD-10-CM | POA: Diagnosis not present

## 2016-11-20 DIAGNOSIS — I130 Hypertensive heart and chronic kidney disease with heart failure and stage 1 through stage 4 chronic kidney disease, or unspecified chronic kidney disease: Secondary | ICD-10-CM | POA: Diagnosis not present

## 2016-11-20 DIAGNOSIS — Z79899 Other long term (current) drug therapy: Secondary | ICD-10-CM | POA: Diagnosis not present

## 2016-11-20 DIAGNOSIS — M109 Gout, unspecified: Secondary | ICD-10-CM | POA: Insufficient documentation

## 2016-11-20 DIAGNOSIS — N183 Chronic kidney disease, stage 3 unspecified: Secondary | ICD-10-CM

## 2016-11-20 DIAGNOSIS — I2510 Atherosclerotic heart disease of native coronary artery without angina pectoris: Secondary | ICD-10-CM | POA: Diagnosis not present

## 2016-11-20 DIAGNOSIS — I10 Essential (primary) hypertension: Secondary | ICD-10-CM | POA: Diagnosis not present

## 2016-11-20 DIAGNOSIS — I255 Ischemic cardiomyopathy: Secondary | ICD-10-CM | POA: Insufficient documentation

## 2016-11-20 DIAGNOSIS — Z7982 Long term (current) use of aspirin: Secondary | ICD-10-CM | POA: Insufficient documentation

## 2016-11-20 DIAGNOSIS — Z95810 Presence of automatic (implantable) cardiac defibrillator: Secondary | ICD-10-CM | POA: Insufficient documentation

## 2016-11-20 MED ORDER — HYDRALAZINE HCL 25 MG PO TABS: 25.0000 mg | ORAL_TABLET | Freq: Three times a day (TID) | ORAL | 3 refills | Status: DC

## 2016-11-20 MED ORDER — ONDANSETRON HCL 4 MG PO TABS: 4.0000 mg | ORAL_TABLET | Freq: Three times a day (TID) | ORAL | 1 refills | Status: DC | PRN

## 2016-11-20 MED ORDER — ISOSORBIDE MONONITRATE ER 30 MG PO TB24: 30.0000 mg | ORAL_TABLET | Freq: Every day | ORAL | 3 refills | Status: DC

## 2016-11-20 NOTE — Patient Instructions (Signed)
RESTART Imdur 30 mg tablet once daily.  RESTART Hydralazine 25 mg tablet three times daily.  Zofran refilled for nausea as needed.  Follow up 2 months with Amy Clegg NP-C.  Take all medication as prescribed the day of your appointment. Bring all medications with you to your appointment.  Do the following things EVERYDAY: 1) Weigh yourself in the morning before breakfast. Write it down and keep it in a log. 2) Take your medicines as prescribed 3) Eat low salt foods-Limit salt (sodium) to 2000 mg per day.  4) Stay as active as you can everyday 5) Limit all fluids for the day to less than 2 liters

## 2016-11-20 NOTE — Progress Notes (Signed)
Patient ID: Marvin Martin, male   DOB: [birth year], 51 y.o.   MRN: [id number]    Advanced Heart Failure Clinic Note   Primary Care: Dr Laney Pastor Primary Cardiologist: Dr. Haroldine Laws  Nephrologist: Dr Joelyn Oms   HPI: Marvin Martin is a 51 year old male with history of CAD, ICM, MI X4  with 17 stents, chronic systolic HF with EF 65% s/p St. Jude ICD, HTN, gout, and hyperlipidemia.  He just relocated to [facility] from [place]. His medical care was provided by [facility]. He has struggled with in-stent restenosis. Most recent heart cath was February 2016 with stent placed.   Admitted May 8th through May 12th, 2017 with chest pain. Had LHC as noted below no intervention.   Admitted June 5th through June 14th, 0354 with A/C systolic CHF. Had repeat R/LHC as below with stable CAD and low output. Started on milrinone and weaned to 0.125 mcg/kg/min for d/c. Coreg, lasix, and spiro stopped.  Entresto dropped from full dose to 24/26 mg, subsequently stopped.   Today he returns for HF follow up. Overall feeling fine. Denies SOB/PND/Orthopnea. Appetite ok. No fever or chills. No chest pain. Weight at home  ~145 pounds. Taking all medications. Lives alone.  01/2015 ECHO  EF 20-25% RV normal  03/21/15: EF 15-20% RV ok  03/2016: Echo EF 15-20%.   Corevue: fluid ok.  R/LHC 06/23/15  Dist RCA-1 lesion, 100% stenosed. The lesion was previously treated with a stent (unknown type).  Dist RCA-2 lesion, 100% stenosed. The lesion was previously treated with a stent (unknown type).  Prox RCA lesion, 70% stenosed. The lesion was previously treated with a stent (unknown type) and angioplasty.  Mid RCA lesion, 50% stenosed. The lesion was previously treated with a stent (unknown type).  Ost RPDA to RPDA lesion, 75% stenosed. The lesion was previously treated with a drug-eluting stent.  Mid LAD lesion, 70% stenosed.  Ost 2nd Diag lesion, 70% stenosed. Ao = 87/62 (71) LV = 81/9 RA = 2 RV = 20/0/2 PA =  24/12 (16) PCW = 4 Fick cardiac output/index = 3.1/1.8 Thermo CO/CI = 3.0/1.7 PVR = 3.9 WU Ao sat = 97% PA sat = 54%, 55%  R/LHC 05/27/15  Dist RCA-1 lesion, 100% stenosed. The lesion was previously treated with a stent (unknown type).  Dist RCA-2 lesion, 100% stenosed. The lesion was previously treated with a stent (unknown type).  Prox RCA lesion, 60% stenosed. The lesion was previously treated with a stent (unknown type) and angioplasty.  Mid RCA lesion, 40% stenosed. The lesion was previously treated with a stent (unknown type). Findings: RA = 1 RV = 22/1/5 PA = 27/13 (19) PCW = 6 Ao = 120/78 (96) LV = 124/4/13 Fick cardiac output/index = 3.3/1.9 PVR = 4.0 WU SVR = 2335  FA sat = 94% PA sat = 60%. 60%  CPX 01/15/15 FVC 2.67 (65%)    FEV1 2.18 (64%)     FEV1/FVC 81 (100%)     MVV 105 (73%) Peak VO2: 20.3 (56.3% predicted peak VO2) VE/VCO2 slope: 26.8 OUES: 1.52 Peak RER: 1.21  CPX 06/2015  Peak VO2: 19.9 (55.8% predicted peak VO2) VE/VCO2 slope: 31.6 OUES: 1.42 Peak RER: 1.26  Labs: 05/08/2016: Creatinine 2.44 K 4.8   SH: Separated from his wife. Has 4 children. 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         Father deceased but he is unsure of the cause. He had hyperlipidemia.  Sister: Heart disease  [facility]  [address] [place] [address] [phone]   Past Medical History:  Diagnosis Date  . Chronic systolic CHF (congestive heart failure) ([place])    a. 01/2015 Echo: EF 20-25%, antlat, inflat AK.  [name] CKD (chronic kidney disease), stage III   . Coronary artery disease    a. s/p MI x 4;  b. Reported h/o 17 stents with recurrent ISR;  c. 02/2014 Cath ([place]): PCI to unknown vessel;  d. 01/2015 MV: EF 18%, large scar, no ischemia; e. 03/2015 PCI: LM nl, LAD nl, D2 patent stent, RI patent stent, LCX patent stents p/m, RCA 95p ISR (PTCA), 41m, 100d into RPL CTO, RPDA 90 (2.5x16 Synergy DES).   . Hyperlipidemia   . Hypertensive heart disease   . Ischemic cardiomyopathy    a. s/p SJM ICD 2007 w/ gen change in 2012; b. 12/2014 CPX Test: mild to mod HF limitation w/ pVO2 20.3 and nl slope;  c. 01/2015 Echo: EF 20-25%.    Current Outpatient Medications  Medication Sig Dispense Refill  . allopurinol (ZYLOPRIM) 100 MG tablet Take 100 mg by mouth 2 (two) times daily.     . AMBULATORY NON FORMULARY MEDICATION Inject 0.125 mcg/kg/min into the vein continuous. Medication Name: Milrinone    . amLODipine (NORVASC) 10 MG tablet TAKE 1 TABLET (10 MG TOTAL) BY MOUTH DAILY. 30 tablet 11  . aspirin 81 MG tablet Take 81 mg by mouth daily.    [name] BRILINTA 90 MG TABS tablet TAKE 1 TABLET (90 MG TOTAL) BY MOUTH 2 (TWO) TIMES DAILY. 180 tablet 1  . cholecalciferol (VITAMIN D) 1000 UNITS tablet Take 1,000 Units by mouth daily.    [name] ENTRESTO 24-26 MG TAKE 1 TABLET BY MOUTH TWICE A DAY 60 tablet 2  . ezetimibe (ZETIA) 10 MG tablet TAKE 1 TABLET BY MOUTH EVERY DAY 90 tablet 0  . hydrALAZINE (APRESOLINE) 100 MG tablet Take 1 tablet (100 mg total) by mouth 3 (three) times daily. 270 tablet 3  . isosorbide mononitrate (IMDUR) 120 MG 24 hr tablet Take 1 tablet (120 mg total) by mouth daily. 30 tablet 5  . ivabradine (CORLANOR) 5 MG TABS tablet Take 1 tablet (5 mg total) by mouth 2 (two) times daily with a meal. 60 tablet 3  . Magnesium Oxide 200 MG TABS Take 1 tablet (200 mg total) by mouth 2 (two) times daily. 180 tablet 3  . nitroGLYCERIN (NITROSTAT) 0.4 MG SL tablet Place 1 tablet (0.4 mg total) under the tongue every 5 (five) minutes as needed for chest pain. Reported on 04/12/2015 60 tablet 1  . omega-3 acid ethyl esters (LOVAZA) 1 G capsule Take 1 g by mouth daily.     . ondansetron (ZOFRAN) 4 MG tablet Take 4 mg by mouth every 8 (eight) hours as needed for nausea or vomiting. Reported on 05/24/2015    . PRALUENT 75 MG/ML SOPN INJECT 75MG  (1 PEN) SUBCUTANEOUSLY EVERY 14 DAYS 2 pen 11  . rosuvastatin (CRESTOR)  40 MG tablet TAKE 1 TABLET BY MOUTH DAILY 90 tablet 3   No current facility-administered medications for this encounter.     No Known Allergies    Social History   Socioeconomic History  . Marital status: Legally Separated    Spouse name: Not on file  . Number of children: Not on file  . Years of education: Not on file  . Highest education level: Not on file  Social Needs  . Financial resource strain:  Not on file  . Food insecurity - worry: Not on file  . Food insecurity - inability: Not on file  . Transportation needs - medical: Not on file  . Transportation needs - non-medical: Not on file  Occupational History  . Not on file  Tobacco Use  . Smoking status: Never Smoker  Substance and Sexual Activity  . Alcohol use: No    Alcohol/week: 0.0 oz  . Drug use: No  . Sexual activity: No  Other Topics Concern  . Not on file  Social History Narrative   Moved to [place] from [place] in 2016.  Lives alone in [place].      Family History  Problem Relation Age of Onset  . Cancer Mother        died when pt was 79.  She had a h/o heart dzs as well.  . Hypertension Mother   . Hyperlipidemia Father        Father died when pt was age 57 - he believes that he had a cardiac hx.  [name] CAD Sister     Vitals:   11/20/16 0903  BP: (!) 156/98  Pulse: 93  SpO2: 99%  Weight: 156 lb 12.8 oz (71.1 kg)   Wt Readings from Last 3 Encounters:  11/20/16 156 lb 12.8 oz (71.1 kg)  09/29/16 154 lb 6.4 oz (70 kg)  07/31/16 150 lb 3.2 oz (68.1 kg)     PHYSICAL EXAM: General:  Well appearing. No resp difficulty. Walked in the clinic.  HEENT: normal Neck: supple. no JVD. Carotids 2+ bilat; no bruits. No lymphadenopathy or thryomegaly appreciated. Cor: PMI nondisplaced. Regular rate & rhythm. No rubs, gallops or murmurs. Lungs: clear Abdomen: soft, nontender, nondistended. No hepatosplenomegaly. No bruits or masses. Good bowel sounds. Extremities: no cyanosis, clubbing, rash, edema. RUE PICC  Site  unremarkable.  Neuro: alert & orientedx3, cranial nerves grossly intact. moves all 4 extremities w/o difficulty. Affect pleasant  ASSESSMENT & PLAN: 1. Chronic Systolic Heart Failure - ICM.  EF 15-20% on echo 3/18. RV ok. Has ST Jude ICD - NYHA II on chronic milrinone 0.25 mcg via PICC. Corvue stable.  - Volume status stable. Continue lasix as needed.   -No bb with low output. - Continue corlanor 5 mg twice a day.  - Continue entresto 24-26 mg twice a day. Will not up titrate with CKD.  - Today we called pharmacy to verify hydralazine/imdur. He has not had hydralazine refilled since 03/2016 and imdur since 11/2015. I will start at a lower dose imdur 30 mg daily and hydralazine 25 mg three times a day.   - Not a candidate for transplant/advanced therapies due to lack of social support. Also his renal function prohibits VAD.  - He understand milrinone will not last forever.  2. CAD- MI X4 17 stents  Most recent [place] 06/2015 - no intervention continued medical management.    Dist RCA-1 lesion, 100% stenosed. The lesion was previously treated with a stent (unknown type).  Dist RCA-2 lesion, 100% stenosed. The lesion was previously treated with a stent (unknown type).  Prox RCA lesion, 70% stenosed. The lesion was previously treated with a stent (unknown type) and angioplasty.  Mid RCA lesion, 50% stenosed. The lesion was previously treated with a stent (unknown type).  Ost RPDA to RPDA lesion, 75% stenosed. The lesion was previously treated with a drug-eluting stent.  Mid LAD lesion, 70% stenosed.  Ost 2nd Diag lesion, 70% stenosed. - Not on beta blocker with low output  on home milrinone.  -No S/S ischemia. Continue crestor daily.   -No bb with low output. No CP.  3. HTN Elevated but not a surprise as he has been out of hydralazine/imdur. As above restart hydralazine/imdur.   4. Hyperlipidemia - On Praulent and statin.  5. CKD stage III - Creatinine base line 2.7 Stable.  6.  Palpitations- resolved.   Greater than 50% of the (total minutes 25) visit spent in counseling/coordination of care regarding  HF medications and  HTN medications. He has not been on hydralazine and imdur in several months.   Continue [facility] for home milrinone. Next visit he will meet with HF SW for GOC.   Follow up in 2 months.       Kyliyah Stirn NP-C  9:06 AM

## 2016-11-22 ENCOUNTER — Telehealth (HOSPITAL_COMMUNITY): Payer: Self-pay | Admitting: Pharmacist

## 2016-11-22 DIAGNOSIS — I255 Ischemic cardiomyopathy: Secondary | ICD-10-CM | POA: Diagnosis not present

## 2016-11-22 DIAGNOSIS — I25110 Atherosclerotic heart disease of native coronary artery with unstable angina pectoris: Secondary | ICD-10-CM | POA: Diagnosis not present

## 2016-11-22 DIAGNOSIS — I252 Old myocardial infarction: Secondary | ICD-10-CM | POA: Diagnosis not present

## 2016-11-22 DIAGNOSIS — I5023 Acute on chronic systolic (congestive) heart failure: Secondary | ICD-10-CM | POA: Diagnosis not present

## 2016-11-22 DIAGNOSIS — N183 Chronic kidney disease, stage 3 (moderate): Secondary | ICD-10-CM | POA: Diagnosis not present

## 2016-11-22 DIAGNOSIS — I130 Hypertensive heart and chronic kidney disease with heart failure and stage 1 through stage 4 chronic kidney disease, or unspecified chronic kidney disease: Secondary | ICD-10-CM | POA: Diagnosis not present

## 2016-11-22 NOTE — Telephone Encounter (Signed)
Ondansetron PA approved by [employer] Part D through 11/21/17.   Ruta Hinds. Velva Harman, PharmD, BCPS, CPP Clinical Pharmacist Pager: [phone] Phone: [phone] 11/22/2016 12:28 PM

## 2016-11-23 ENCOUNTER — Other Ambulatory Visit (HOSPITAL_COMMUNITY): Payer: Self-pay | Admitting: Internal Medicine

## 2016-11-28 ENCOUNTER — Other Ambulatory Visit (HOSPITAL_COMMUNITY): Payer: Self-pay | Admitting: Internal Medicine

## 2016-11-29 DIAGNOSIS — I25110 Atherosclerotic heart disease of native coronary artery with unstable angina pectoris: Secondary | ICD-10-CM | POA: Diagnosis not present

## 2016-11-29 DIAGNOSIS — I255 Ischemic cardiomyopathy: Secondary | ICD-10-CM | POA: Diagnosis not present

## 2016-11-29 DIAGNOSIS — N183 Chronic kidney disease, stage 3 (moderate): Secondary | ICD-10-CM | POA: Diagnosis not present

## 2016-11-29 DIAGNOSIS — I130 Hypertensive heart and chronic kidney disease with heart failure and stage 1 through stage 4 chronic kidney disease, or unspecified chronic kidney disease: Secondary | ICD-10-CM | POA: Diagnosis not present

## 2016-11-29 DIAGNOSIS — I5023 Acute on chronic systolic (congestive) heart failure: Secondary | ICD-10-CM | POA: Diagnosis not present

## 2016-11-29 DIAGNOSIS — I252 Old myocardial infarction: Secondary | ICD-10-CM | POA: Diagnosis not present

## 2016-12-06 ENCOUNTER — Other Ambulatory Visit (HOSPITAL_COMMUNITY): Payer: Self-pay | Admitting: Internal Medicine

## 2016-12-06 DIAGNOSIS — I5023 Acute on chronic systolic (congestive) heart failure: Secondary | ICD-10-CM | POA: Diagnosis not present

## 2016-12-06 DIAGNOSIS — I255 Ischemic cardiomyopathy: Secondary | ICD-10-CM | POA: Diagnosis not present

## 2016-12-06 DIAGNOSIS — N183 Chronic kidney disease, stage 3 (moderate): Secondary | ICD-10-CM | POA: Diagnosis not present

## 2016-12-06 DIAGNOSIS — I130 Hypertensive heart and chronic kidney disease with heart failure and stage 1 through stage 4 chronic kidney disease, or unspecified chronic kidney disease: Secondary | ICD-10-CM | POA: Diagnosis not present

## 2016-12-06 DIAGNOSIS — I252 Old myocardial infarction: Secondary | ICD-10-CM | POA: Diagnosis not present

## 2016-12-06 DIAGNOSIS — I25110 Atherosclerotic heart disease of native coronary artery with unstable angina pectoris: Secondary | ICD-10-CM | POA: Diagnosis not present

## 2016-12-11 ENCOUNTER — Ambulatory Visit: Payer: Medicare Other | Admitting: Family Medicine

## 2016-12-13 DIAGNOSIS — I252 Old myocardial infarction: Secondary | ICD-10-CM | POA: Diagnosis not present

## 2016-12-13 DIAGNOSIS — I25110 Atherosclerotic heart disease of native coronary artery with unstable angina pectoris: Secondary | ICD-10-CM | POA: Diagnosis not present

## 2016-12-13 DIAGNOSIS — I5023 Acute on chronic systolic (congestive) heart failure: Secondary | ICD-10-CM | POA: Diagnosis not present

## 2016-12-13 DIAGNOSIS — I130 Hypertensive heart and chronic kidney disease with heart failure and stage 1 through stage 4 chronic kidney disease, or unspecified chronic kidney disease: Secondary | ICD-10-CM | POA: Diagnosis not present

## 2016-12-13 DIAGNOSIS — I255 Ischemic cardiomyopathy: Secondary | ICD-10-CM | POA: Diagnosis not present

## 2016-12-13 DIAGNOSIS — N183 Chronic kidney disease, stage 3 (moderate): Secondary | ICD-10-CM | POA: Diagnosis not present

## 2016-12-20 DIAGNOSIS — I5023 Acute on chronic systolic (congestive) heart failure: Secondary | ICD-10-CM | POA: Diagnosis not present

## 2016-12-20 DIAGNOSIS — I252 Old myocardial infarction: Secondary | ICD-10-CM | POA: Diagnosis not present

## 2016-12-20 DIAGNOSIS — I130 Hypertensive heart and chronic kidney disease with heart failure and stage 1 through stage 4 chronic kidney disease, or unspecified chronic kidney disease: Secondary | ICD-10-CM | POA: Diagnosis not present

## 2016-12-20 DIAGNOSIS — I25110 Atherosclerotic heart disease of native coronary artery with unstable angina pectoris: Secondary | ICD-10-CM | POA: Diagnosis not present

## 2016-12-20 DIAGNOSIS — I255 Ischemic cardiomyopathy: Secondary | ICD-10-CM | POA: Diagnosis not present

## 2016-12-20 DIAGNOSIS — N183 Chronic kidney disease, stage 3 (moderate): Secondary | ICD-10-CM | POA: Diagnosis not present

## 2016-12-22 DIAGNOSIS — Z955 Presence of coronary angioplasty implant and graft: Secondary | ICD-10-CM | POA: Diagnosis not present

## 2016-12-22 DIAGNOSIS — N183 Chronic kidney disease, stage 3 (moderate): Secondary | ICD-10-CM | POA: Diagnosis not present

## 2016-12-22 DIAGNOSIS — I255 Ischemic cardiomyopathy: Secondary | ICD-10-CM | POA: Diagnosis not present

## 2016-12-22 DIAGNOSIS — I25110 Atherosclerotic heart disease of native coronary artery with unstable angina pectoris: Secondary | ICD-10-CM | POA: Diagnosis not present

## 2016-12-22 DIAGNOSIS — Z950 Presence of cardiac pacemaker: Secondary | ICD-10-CM | POA: Diagnosis not present

## 2016-12-22 DIAGNOSIS — Z5181 Encounter for therapeutic drug level monitoring: Secondary | ICD-10-CM | POA: Diagnosis not present

## 2016-12-22 DIAGNOSIS — I5023 Acute on chronic systolic (congestive) heart failure: Secondary | ICD-10-CM | POA: Diagnosis not present

## 2016-12-22 DIAGNOSIS — E785 Hyperlipidemia, unspecified: Secondary | ICD-10-CM | POA: Diagnosis not present

## 2016-12-22 DIAGNOSIS — Z452 Encounter for adjustment and management of vascular access device: Secondary | ICD-10-CM | POA: Diagnosis not present

## 2016-12-22 DIAGNOSIS — I252 Old myocardial infarction: Secondary | ICD-10-CM | POA: Diagnosis not present

## 2016-12-22 DIAGNOSIS — I130 Hypertensive heart and chronic kidney disease with heart failure and stage 1 through stage 4 chronic kidney disease, or unspecified chronic kidney disease: Secondary | ICD-10-CM | POA: Diagnosis not present

## 2016-12-22 DIAGNOSIS — Z7901 Long term (current) use of anticoagulants: Secondary | ICD-10-CM | POA: Diagnosis not present

## 2016-12-26 ENCOUNTER — Other Ambulatory Visit (HOSPITAL_COMMUNITY): Payer: Self-pay | Admitting: Internal Medicine

## 2016-12-27 DIAGNOSIS — I25110 Atherosclerotic heart disease of native coronary artery with unstable angina pectoris: Secondary | ICD-10-CM | POA: Diagnosis not present

## 2016-12-27 DIAGNOSIS — I5023 Acute on chronic systolic (congestive) heart failure: Secondary | ICD-10-CM | POA: Diagnosis not present

## 2016-12-27 DIAGNOSIS — I255 Ischemic cardiomyopathy: Secondary | ICD-10-CM | POA: Diagnosis not present

## 2016-12-27 DIAGNOSIS — I252 Old myocardial infarction: Secondary | ICD-10-CM | POA: Diagnosis not present

## 2016-12-27 DIAGNOSIS — I130 Hypertensive heart and chronic kidney disease with heart failure and stage 1 through stage 4 chronic kidney disease, or unspecified chronic kidney disease: Secondary | ICD-10-CM | POA: Diagnosis not present

## 2016-12-27 DIAGNOSIS — N183 Chronic kidney disease, stage 3 (moderate): Secondary | ICD-10-CM | POA: Diagnosis not present

## 2016-12-29 ENCOUNTER — Encounter: Payer: Self-pay | Admitting: Internal Medicine

## 2017-01-03 ENCOUNTER — Encounter: Payer: Medicare Other | Admitting: Internal Medicine

## 2017-01-03 DIAGNOSIS — I25110 Atherosclerotic heart disease of native coronary artery with unstable angina pectoris: Secondary | ICD-10-CM | POA: Diagnosis not present

## 2017-01-03 DIAGNOSIS — I252 Old myocardial infarction: Secondary | ICD-10-CM | POA: Diagnosis not present

## 2017-01-03 DIAGNOSIS — N183 Chronic kidney disease, stage 3 (moderate): Secondary | ICD-10-CM | POA: Diagnosis not present

## 2017-01-03 DIAGNOSIS — I130 Hypertensive heart and chronic kidney disease with heart failure and stage 1 through stage 4 chronic kidney disease, or unspecified chronic kidney disease: Secondary | ICD-10-CM | POA: Diagnosis not present

## 2017-01-03 DIAGNOSIS — I5023 Acute on chronic systolic (congestive) heart failure: Secondary | ICD-10-CM | POA: Diagnosis not present

## 2017-01-03 DIAGNOSIS — I255 Ischemic cardiomyopathy: Secondary | ICD-10-CM | POA: Diagnosis not present

## 2017-01-10 DIAGNOSIS — I5023 Acute on chronic systolic (congestive) heart failure: Secondary | ICD-10-CM | POA: Diagnosis not present

## 2017-01-10 DIAGNOSIS — N183 Chronic kidney disease, stage 3 (moderate): Secondary | ICD-10-CM | POA: Diagnosis not present

## 2017-01-10 DIAGNOSIS — I255 Ischemic cardiomyopathy: Secondary | ICD-10-CM | POA: Diagnosis not present

## 2017-01-10 DIAGNOSIS — I252 Old myocardial infarction: Secondary | ICD-10-CM | POA: Diagnosis not present

## 2017-01-10 DIAGNOSIS — I130 Hypertensive heart and chronic kidney disease with heart failure and stage 1 through stage 4 chronic kidney disease, or unspecified chronic kidney disease: Secondary | ICD-10-CM | POA: Diagnosis not present

## 2017-01-10 DIAGNOSIS — I25110 Atherosclerotic heart disease of native coronary artery with unstable angina pectoris: Secondary | ICD-10-CM | POA: Diagnosis not present

## 2017-01-12 ENCOUNTER — Other Ambulatory Visit (HOSPITAL_COMMUNITY): Payer: Self-pay | Admitting: Vascular Surgery

## 2017-01-17 DIAGNOSIS — N183 Chronic kidney disease, stage 3 (moderate): Secondary | ICD-10-CM | POA: Diagnosis not present

## 2017-01-17 DIAGNOSIS — I255 Ischemic cardiomyopathy: Secondary | ICD-10-CM | POA: Diagnosis not present

## 2017-01-17 DIAGNOSIS — I130 Hypertensive heart and chronic kidney disease with heart failure and stage 1 through stage 4 chronic kidney disease, or unspecified chronic kidney disease: Secondary | ICD-10-CM | POA: Diagnosis not present

## 2017-01-17 DIAGNOSIS — I252 Old myocardial infarction: Secondary | ICD-10-CM | POA: Diagnosis not present

## 2017-01-17 DIAGNOSIS — I25110 Atherosclerotic heart disease of native coronary artery with unstable angina pectoris: Secondary | ICD-10-CM | POA: Diagnosis not present

## 2017-01-17 DIAGNOSIS — I5023 Acute on chronic systolic (congestive) heart failure: Secondary | ICD-10-CM | POA: Diagnosis not present

## 2017-01-19 ENCOUNTER — Other Ambulatory Visit (HOSPITAL_COMMUNITY): Payer: Self-pay | Admitting: Internal Medicine

## 2017-01-19 ENCOUNTER — Ambulatory Visit (INDEPENDENT_AMBULATORY_CARE_PROVIDER_SITE_OTHER): Payer: Medicare Other | Admitting: Internal Medicine

## 2017-01-19 ENCOUNTER — Encounter: Payer: Self-pay | Admitting: Internal Medicine

## 2017-01-19 VITALS — BP 124/64 | HR 91 | Ht 67.0 in | Wt 155.0 lb

## 2017-01-19 DIAGNOSIS — I5042 Chronic combined systolic (congestive) and diastolic (congestive) heart failure: Secondary | ICD-10-CM

## 2017-01-19 DIAGNOSIS — I255 Ischemic cardiomyopathy: Secondary | ICD-10-CM

## 2017-01-19 DIAGNOSIS — Z95810 Presence of automatic (implantable) cardiac defibrillator: Secondary | ICD-10-CM

## 2017-01-19 NOTE — Progress Notes (Signed)
Patient Care Team: Arnoldo Morale, MD as PCP - General (Family Medicine)   HPI  Marvin Martin is a 52 y.o. male With ICD for primary prevention  He has a history of ischemic heart disease with prior MI 4 and stents 17. He has an ejection fraction of 30% and has been managed mostly in [place]. He has recently moved to [facility].  He has a St. Jude ICD implanted 2007 with generator replacement 2012 initially   for primary prevention. He has however had 4 shocks 3 on his initial device and one on this device.   he is unaware as to whether they were appropriate or inappropriate. He does recognize the term ventricular fibrillation as it applies to him.  Heart failure notes were reviewed.  He remains on milrinone.  He is not on beta-blocker because of low output.  No palpitations or syncope.      DATE TEST    1/17 Echo    EF 20-25 %   3/18 Echo    EF 15-20 %          Date Cr K  8/17 2.6 4.6  1/18 2.25 4.1    Records and Results Reviewed   Past Medical History:  Diagnosis Date  . Chronic systolic CHF (congestive heart failure) ([place])    a. 01/2015 Echo: EF 20-25%, antlat, inflat AK.  [name] CKD (chronic kidney disease), stage III ([place])   . Coronary artery disease    a. s/p MI x 4;  b. Reported h/o 17 stents with recurrent ISR;  c. 02/2014 Cath ([place]): PCI to unknown vessel;  d. 01/2015 MV: EF 18%, large scar, no ischemia; e. 03/2015 PCI: LM nl, LAD nl, D2 patent stent, RI patent stent, LCX patent stents p/m, RCA 95p ISR (PTCA), 71m, 100d into RPL CTO, RPDA 90 (2.5x16 Synergy DES).  . Hyperlipidemia   . Hypertensive heart disease   . Ischemic cardiomyopathy    a. s/p SJM ICD 2007 w/ gen change in 2012; b. 12/2014 CPX Test: mild to mod HF limitation w/ pVO2 20.3 and nl slope;  c. 01/2015 Echo: EF 20-25%.    Past Surgical History:  Procedure Laterality Date  . 17 stints placements Bilateral   . CARDIAC CATHETERIZATION     Most recent 02/2014   . CARDIAC CATHETERIZATION  N/A 03/31/2015   Procedure: Left Heart Cath and Coronary Angiography;  Surgeon: Troy Sine, MD;  Location: [facility];  Service: Cardiovascular;  Laterality: N/A;  . CARDIAC CATHETERIZATION N/A 03/31/2015   Procedure: Coronary Stent Intervention;  Surgeon: Troy Sine, MD;  Location: [facility];  Service: Cardiovascular;  Laterality: N/A;  . CARDIAC CATHETERIZATION N/A 05/27/2015   Procedure: Right/Left Heart Cath and Coronary Angiography;  Surgeon: Jolaine Artist, MD;  Location: [place];  Service: Cardiovascular;  Laterality: N/A;  . CARDIAC CATHETERIZATION N/A 06/23/2015   Procedure: Right/Left Heart Cath and Coronary Angiography;  Surgeon: Jolaine Artist, MD;  Location: [facility];  Service: Cardiovascular;  Laterality: N/A;  . INSERT / REPLACE / REMOVE PACEMAKER     St jude 2006  Generator Change 2012   . PACEMAKER GENERATOR CHANGE Bilateral     Current Outpatient Medications  Medication Sig Dispense Refill  . allopurinol (ZYLOPRIM) 100 MG tablet Take 100 mg by mouth 2 (two) times daily.     . AMBULATORY NON FORMULARY MEDICATION Inject 0.125 mcg/kg/min into the vein continuous. Medication Name:  Milrinone    . amLODipine (NORVASC) 10 MG tablet TAKE 1 TABLET (10 MG TOTAL) BY MOUTH DAILY. 30 tablet 11  . aspirin 81 MG tablet Take 81 mg by mouth daily.    [name] BRILINTA 90 MG TABS tablet TAKE 1 TABLET (90 MG TOTAL) BY MOUTH 2 (TWO) TIMES DAILY. 180 tablet 1  . cholecalciferol (VITAMIN D) 1000 UNITS tablet Take 1,000 Units by mouth daily.    [name] ENTRESTO 24-26 MG TAKE 1 TABLET BY MOUTH TWICE A DAY 60 tablet 3  . ezetimibe (ZETIA) 10 MG tablet TAKE 1 TABLET BY MOUTH EVERY DAY 90 tablet 0  . hydrALAZINE (APRESOLINE) 25 MG tablet Take 1 tablet (25 mg total) 3 (three) times daily by mouth. 270 tablet 3  . isosorbide mononitrate (IMDUR) 30 MG 24 hr tablet Take 1 tablet (30 mg total) daily by mouth. 90 tablet 3  . ivabradine (CORLANOR) 5 MG TABS tablet Take 1  tablet (5 mg total) by mouth 2 (two) times daily with a meal. 60 tablet 3  . Magnesium Oxide 200 MG TABS Take 1 tablet (200 mg total) by mouth 2 (two) times daily. 180 tablet 3  . nitroGLYCERIN (NITROSTAT) 0.4 MG SL tablet Place 1 tablet (0.4 mg total) under the tongue every 5 (five) minutes as needed for chest pain. Reported on 04/12/2015 60 tablet 1  . omega-3 acid ethyl esters (LOVAZA) 1 G capsule Take 1 g by mouth daily.     . ondansetron (ZOFRAN) 4 MG tablet Take 1 tablet (4 mg total) every 8 (eight) hours as needed by mouth for nausea or vomiting. Reported on 05/24/2015 30 tablet 1  . PRALUENT 75 MG/ML SOPN INJECT 75MG  (1 PEN) SUBCUTANEOUSLY EVERY 14 DAYS 2 pen 11  . rosuvastatin (CRESTOR) 40 MG tablet TAKE 1 TABLET BY MOUTH DAILY 90 tablet 3   No current facility-administered medications for this visit.     No Known Allergies    Review of Systems negative except from HPI and PMH  Physical Exam BP 124/64   Pulse 91   Ht 5\' 7"  (1.702 m)   Wt 155 lb (70.3 kg)   BMI 24.28 kg/m  Well developed and well nourished in no acute distress HENT normal E scleral and icterus clear Neck Supple JVP flat; carotids brisk and full Clear to ausculation Device pocket well healed; without hematoma or erythema.  There is no tethering Regular rate and rhythm, no murmurs gallops or rub Soft with active bowel sounds No clubbing cyanosis  Edema Alert and oriented, grossly normal motor and sensory function Skin Warm and Dry  ECG demonstrates sinus rhythm at 91 Interval 17/12/37 LVH with repolarization and occasional PVC  Assessment and  Plan  Ischemic cardiomyopathy  Implantable defibrillator-St. Jude  Renal insufficiency grade 3-4  Ventricular tachycardia-nonsustained   Euvolemic continue current meds  Without symptoms of ischemia  Nonsustained ventricular tachycardia--continue to observe.  Not on beta-blockers as he is on milrinone.  He is to see heart failure clinic next  week.   Current medicines are reviewed at length with the patient today .  The patient does not  have concerns regarding medicines.

## 2017-01-19 NOTE — Patient Instructions (Signed)
Medication Instructions: Your physician recommends that you continue on your current medications as directed. Please refer to the Current Medication list given to you today.  Labwork: None Ordered  Procedures/Testing: None Ordered  Follow-Up: Your physician wants you to follow-up in: 1 YEAR with Dr. Caryl Comes. You will receive a reminder letter in the mail two months in advance. If you don't receive a letter, please call our office to schedule the follow-up appointment.  Remote monitoring is used to monitor your ICD from home. This monitoring reduces the number of office visits required to check your device to one time per year. It allows [place] to keep an eye on the functioning of your device to ensure it is working properly. You are scheduled for a device check from home on 01/25/17. You may send your transmission at any time that day. If you have a wireless device, the transmission will be sent automatically. After your physician reviews your transmission, you will receive a postcard with your next transmission date.   If you need a refill on your cardiac medications before your next appointment, please call your pharmacy.

## 2017-01-22 ENCOUNTER — Encounter (HOSPITAL_COMMUNITY): Payer: Self-pay

## 2017-01-22 ENCOUNTER — Ambulatory Visit (HOSPITAL_COMMUNITY)
Admission: RE | Admit: 2017-01-22 | Discharge: 2017-01-22 | Disposition: A | Discharge status: Home / Self Care | Payer: Medicare Other | Source: Ambulatory Visit | Attending: Cardiology | Admitting: Cardiology

## 2017-01-22 VITALS — BP 150/80 | HR 78 | Wt 159.9 lb

## 2017-01-22 DIAGNOSIS — Z955 Presence of coronary angioplasty implant and graft: Secondary | ICD-10-CM | POA: Diagnosis not present

## 2017-01-22 DIAGNOSIS — I252 Old myocardial infarction: Secondary | ICD-10-CM | POA: Insufficient documentation

## 2017-01-22 DIAGNOSIS — I130 Hypertensive heart and chronic kidney disease with heart failure and stage 1 through stage 4 chronic kidney disease, or unspecified chronic kidney disease: Secondary | ICD-10-CM | POA: Diagnosis not present

## 2017-01-22 DIAGNOSIS — I5022 Chronic systolic (congestive) heart failure: Secondary | ICD-10-CM | POA: Diagnosis not present

## 2017-01-22 DIAGNOSIS — Z7982 Long term (current) use of aspirin: Secondary | ICD-10-CM | POA: Insufficient documentation

## 2017-01-22 DIAGNOSIS — I255 Ischemic cardiomyopathy: Secondary | ICD-10-CM | POA: Insufficient documentation

## 2017-01-22 DIAGNOSIS — I10 Essential (primary) hypertension: Secondary | ICD-10-CM

## 2017-01-22 DIAGNOSIS — E785 Hyperlipidemia, unspecified: Secondary | ICD-10-CM | POA: Diagnosis not present

## 2017-01-22 DIAGNOSIS — N183 Chronic kidney disease, stage 3 unspecified: Secondary | ICD-10-CM

## 2017-01-22 DIAGNOSIS — R002 Palpitations: Secondary | ICD-10-CM | POA: Insufficient documentation

## 2017-01-22 DIAGNOSIS — Z79899 Other long term (current) drug therapy: Secondary | ICD-10-CM | POA: Diagnosis not present

## 2017-01-22 DIAGNOSIS — I25118 Atherosclerotic heart disease of native coronary artery with other forms of angina pectoris: Secondary | ICD-10-CM

## 2017-01-22 DIAGNOSIS — I2510 Atherosclerotic heart disease of native coronary artery without angina pectoris: Secondary | ICD-10-CM | POA: Diagnosis not present

## 2017-01-22 DIAGNOSIS — M109 Gout, unspecified: Secondary | ICD-10-CM | POA: Insufficient documentation

## 2017-01-22 DIAGNOSIS — I5020 Unspecified systolic (congestive) heart failure: Secondary | ICD-10-CM | POA: Diagnosis not present

## 2017-01-22 DIAGNOSIS — Z95810 Presence of automatic (implantable) cardiac defibrillator: Secondary | ICD-10-CM | POA: Insufficient documentation

## 2017-01-22 MED ORDER — HYDRALAZINE HCL 50 MG PO TABS: 50.0000 mg | ORAL_TABLET | Freq: Three times a day (TID) | ORAL | 3 refills | Status: DC

## 2017-01-22 NOTE — Patient Instructions (Signed)
INCREASE Hydralazine to 50 mg (one tab) three times today  Your physician recommends that you schedule a follow-up appointment in: 4 weeks with Rebecca Eaton   Do the following things EVERYDAY: 1) Weigh yourself in the morning before breakfast. Write it down and keep it in a log. 2) Take your medicines as prescribed 3) Eat low salt foods-Limit salt (sodium) to 2000 mg per day.  4) Stay as active as you can everyday 5) Limit all fluids for the day to less than 2 liters

## 2017-01-22 NOTE — Progress Notes (Signed)
Patient ID: Marvin Martin, male   DOB: 1965/02/03, 52 y.o.   MRN: [id number]    Advanced Heart Failure Clinic Note   Primary Care: Dr Laney Pastor Primary Cardiologist: Dr. Haroldine Laws  Nephrologist: Dr Joelyn Oms   HPI: Mr Hincapie is a 52 year old male with history of CAD, ICM, MI X4  with 17 stents, chronic systolic HF with EF 79% s/p St. Jude ICD, HTN, gout, and hyperlipidemia.  He just relocated to [facility] from [place]. His medical care was provided by [facility]. He has struggled with in-stent restenosis. Most recent heart cath was February 2016 with stent placed.   Admitted May 8th through May 12th, 2017 with chest pain. Had LHC as noted below no intervention.   Admitted June 5th through June 14th, 0240 with A/C systolic CHF. Had repeat R/LHC as below with stable CAD and low output. Started on milrinone and weaned to 0.125 mcg/kg/min for d/c. Coreg, lasix, and spiro stopped.  Entresto dropped from full dose to 24/26 mg, subsequently stopped.   Pt presents today for regular follow up.  Doing well overall. At last visit discovered he had been off Hydral/imdur for sometime. Resumed at low dose.  BP remains slightly elevated today.  Taking all medications as directed now. Hasn't needed any extra lasix. Denies SOB/Orthopnea. No lightheadedness or dizziness.   01/2015 ECHO  EF 20-25% RV normal  03/21/15: EF 15-20% RV ok  03/2016: Echo EF 15-20%.   Corevue: Thoracic impedence above threshold. No VT/VF. Pt activity not seen.   R/LHC 06/23/15  Dist RCA-1 lesion, 100% stenosed. The lesion was previously treated with a stent (unknown type).  Dist RCA-2 lesion, 100% stenosed. The lesion was previously treated with a stent (unknown type).  Prox RCA lesion, 70% stenosed. The lesion was previously treated with a stent (unknown type) and angioplasty.  Mid RCA lesion, 50% stenosed. The lesion was previously treated with a stent (unknown type).  Ost RPDA to RPDA lesion, 75% stenosed. The lesion  was previously treated with a drug-eluting stent.  Mid LAD lesion, 70% stenosed.  Ost 2nd Diag lesion, 70% stenosed. Ao = 87/62 (71) LV = 81/9 RA = 2 RV = 20/0/2 PA = 24/12 (16) PCW = 4 Fick cardiac output/index = 3.1/1.8 Thermo CO/CI = 3.0/1.7 PVR = 3.9 WU Ao sat = 97% PA sat = 54%, 55%  R/LHC 05/27/15  Dist RCA-1 lesion, 100% stenosed. The lesion was previously treated with a stent (unknown type).  Dist RCA-2 lesion, 100% stenosed. The lesion was previously treated with a stent (unknown type).  Prox RCA lesion, 60% stenosed. The lesion was previously treated with a stent (unknown type) and angioplasty.  Mid RCA lesion, 40% stenosed. The lesion was previously treated with a stent (unknown type). Findings: RA = 1 RV = 22/1/5 PA = 27/13 (19) PCW = 6 Ao = 120/78 (96) LV = 124/4/13 Fick cardiac output/index = 3.3/1.9 PVR = 4.0 WU SVR = 2335  FA sat = 94% PA sat = 60%. 60%  CPX 01/15/15 FVC 2.67 (65%)    FEV1 2.18 (64%)     FEV1/FVC 81 (100%)     MVV 105 (73%) Peak VO2: 20.3 (56.3% predicted peak VO2) VE/VCO2 slope: 26.8 OUES: 1.52 Peak RER: 1.21  CPX 06/2015  Peak VO2: 19.9 (55.8% predicted peak VO2) VE/VCO2 slope: 31.6 OUES: 1.42 Peak RER: 1.26  Labs: 05/08/2016: Creatinine 2.44 K 4.8   SH: Separated from his wife. Has 4 children. 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 [name]  FH: Mom deceased cancer.         Father deceased but he is unsure of the cause. He had hyperlipidemia.         Sister: Heart disease  [facility]  [address] [place] [address] [phone]  Past Medical History:  Diagnosis Date  . Chronic systolic CHF (congestive heart failure) ([place])    a. 01/2015 Echo: EF 20-25%, antlat, inflat AK.  [name] CKD (chronic kidney disease), stage III ([place])   . Coronary artery disease    a. s/p MI x 4;  b. Reported h/o 17 stents with recurrent ISR;  c. 02/2014 Cath ([place]): PCI to unknown vessel;  d. 01/2015 MV: EF 18%, large  scar, no ischemia; e. 03/2015 PCI: LM nl, LAD nl, D2 patent stent, RI patent stent, LCX patent stents p/m, RCA 95p ISR (PTCA), 20m, 100d into RPL CTO, RPDA 90 (2.5x16 Synergy DES).  . Hyperlipidemia   . Hypertensive heart disease   . Ischemic cardiomyopathy    a. s/p SJM ICD 2007 w/ gen change in 2012; b. 12/2014 CPX Test: mild to mod HF limitation w/ pVO2 20.3 and nl slope;  c. 01/2015 Echo: EF 20-25%.   Current Outpatient Medications  Medication Sig Dispense Refill  . allopurinol (ZYLOPRIM) 100 MG tablet Take 100 mg by mouth 2 (two) times daily.     . AMBULATORY NON FORMULARY MEDICATION Inject 0.125 mcg/kg/min into the vein continuous. Medication Name: Milrinone    . amLODipine (NORVASC) 10 MG tablet TAKE 1 TABLET (10 MG TOTAL) BY MOUTH DAILY. 30 tablet 11  . aspirin 81 MG tablet Take 81 mg by mouth daily.    [name] BRILINTA 90 MG TABS tablet TAKE 1 TABLET (90 MG TOTAL) BY MOUTH 2 (TWO) TIMES DAILY. 180 tablet 1  . cholecalciferol (VITAMIN D) 1000 UNITS tablet Take 1,000 Units by mouth daily.    [name] ENTRESTO 24-26 MG TAKE 1 TABLET BY MOUTH TWICE A DAY 60 tablet 3  . ezetimibe (ZETIA) 10 MG tablet TAKE 1 TABLET BY MOUTH EVERY DAY 90 tablet 0  . hydrALAZINE (APRESOLINE) 50 MG tablet Take 1 tablet (50 mg total) by mouth 3 (three) times daily. 90 tablet 3  . isosorbide mononitrate (IMDUR) 30 MG 24 hr tablet Take 1 tablet (30 mg total) daily by mouth. 90 tablet 3  . ivabradine (CORLANOR) 5 MG TABS tablet Take 1 tablet (5 mg total) by mouth 2 (two) times daily with a meal. 60 tablet 3  . Magnesium Oxide 200 MG TABS Take 1 tablet (200 mg total) by mouth 2 (two) times daily. 180 tablet 3  . nitroGLYCERIN (NITROSTAT) 0.4 MG SL tablet Place 1 tablet (0.4 mg total) under the tongue every 5 (five) minutes as needed for chest pain. Reported on 04/12/2015 60 tablet 1  . omega-3 acid ethyl esters (LOVAZA) 1 G capsule Take 1 g by mouth daily.     . ondansetron (ZOFRAN) 4 MG tablet Take 1 tablet (4 mg total) every 8  (eight) hours as needed by mouth for nausea or vomiting. Reported on 05/24/2015 30 tablet 1  . PRALUENT 75 MG/ML SOPN INJECT 75MG  (1 PEN) SUBCUTANEOUSLY EVERY 14 DAYS 2 pen 11  . rosuvastatin (CRESTOR) 40 MG tablet TAKE 1 TABLET BY MOUTH DAILY 90 tablet 3   No current facility-administered medications for this encounter.    No Known Allergies   Social History   Socioeconomic History  . Marital status: Legally Separated    Spouse name: Not on  file  . Number of children: Not on file  . Years of education: Not on file  . Highest education level: Not on file  Social Needs  . Financial resource strain: Not on file  . Food insecurity - worry: Not on file  . Food insecurity - inability: Not on file  . Transportation needs - medical: Not on file  . Transportation needs - non-medical: Not on file  Occupational History  . Not on file  Tobacco Use  . Smoking status: Never Smoker  . Smokeless tobacco: Never Used  Substance and Sexual Activity  . Alcohol use: No    Alcohol/week: 0.0 oz  . Drug use: No  . Sexual activity: No  Other Topics Concern  . Not on file  Social History Narrative   Moved to [place] from [place] in 2016.  Lives alone in [place]    Family History  Problem Relation Age of Onset  . Cancer Mother        died when pt was 71.  She had a h/o heart dzs as well.  . Hypertension Mother   . Hyperlipidemia Father        Father died when pt was age 68 - he believes that he had a cardiac hx.  [name] CAD Sister    Vitals:   01/22/17 0906  BP: (!) 150/80  Pulse: 78  SpO2: 100%  Weight: 159 lb 14.4 oz (72.5 kg)   Wt Readings from Last 3 Encounters:  01/22/17 159 lb 14.4 oz (72.5 kg)  01/19/17 155 lb (70.3 kg)  11/20/16 156 lb 12.8 oz (71.1 kg)     PHYSICAL EXAM: General: NAD.  HEENT: Normal Neck: Supple. JVP 5-6. Carotids 2+ bilat; no bruits. No thyromegaly or nodule noted. Cor: PMI nondisplaced. RRR, No M/G/R noted Lungs: CTAB, normal effort. Abdomen: Soft, non-tender,  non-distended, no HSM. No bruits or masses. +BS  Extremities: No cyanosis, clubbing, or rash. RUE PICC. Site without drainage, redness, or tenderness.  Neuro: Alert & orientedx3, cranial nerves grossly intact. moves all 4 extremities w/o difficulty. Affect pleasant   ASSESSMENT & PLAN: 1. Chronic Systolic Heart Failure - ICM.  EF 15-20% on echo 3/18. RV ok. Has ST Jude ICD - NYHA II on chronic milrinone 0.25 mcg via PICC.  - Volume status stable on exam and Corevue.  - Continue lasix as needed.  - No bb with low output. - Continue corlanor 5 mg twice a day.  - Continue entresto 24-26 mg BID. a day. Will not up titrate with CKD.  - Increase hydralazine to 50 mg TID - Continue imdur 30 mg daily.  - Not a candidate for transplant/advanced therapies due to lack of social support. Also his renal function prohibits VAD.  - He understand milrinone will not last forever.  2. CAD- MI X4 17 stents  Most recent [place] 06/2015 - no intervention continued medical management.    Dist RCA-1 lesion, 100% stenosed. The lesion was previously treated with a stent (unknown type).  Dist RCA-2 lesion, 100% stenosed. The lesion was previously treated with a stent (unknown type).  Prox RCA lesion, 70% stenosed. The lesion was previously treated with a stent (unknown type) and angioplasty.  Mid RCA lesion, 50% stenosed. The lesion was previously treated with a stent (unknown type).  Ost RPDA to RPDA lesion, 75% stenosed. The lesion was previously treated with a drug-eluting stent.  Mid LAD lesion, 70% stenosed.  Ost 2nd Diag lesion, 70% stenosed. - No BB with low output  on home milrinone.  - No s/s of ischemia. Continue crestor daily.   3. HTN - Meds as above.  4. Hyperlipidemia - On Praulent and statin.  5. CKD stage III  - Creatinine base line 2.7. Continue to follow closely via [place].  6. Palpitations - Resolved.   Continue AHC for home milrinone. Will have him meet with HF SW for GOC.   Shirley Friar, PA-C  9:26 AM   Greater than 50% of the 25 minute visit was spent in counseling/coordination of care regarding disease state education, salt/fluid restriction, sliding scale diuretics, and medication compliance.

## 2017-01-23 DIAGNOSIS — I509 Heart failure, unspecified: Secondary | ICD-10-CM | POA: Diagnosis not present

## 2017-01-23 DIAGNOSIS — N183 Chronic kidney disease, stage 3 (moderate): Secondary | ICD-10-CM | POA: Diagnosis not present

## 2017-01-23 DIAGNOSIS — I129 Hypertensive chronic kidney disease with stage 1 through stage 4 chronic kidney disease, or unspecified chronic kidney disease: Secondary | ICD-10-CM | POA: Diagnosis not present

## 2017-01-23 DIAGNOSIS — N2581 Secondary hyperparathyroidism of renal origin: Secondary | ICD-10-CM | POA: Diagnosis not present

## 2017-01-24 DIAGNOSIS — I25110 Atherosclerotic heart disease of native coronary artery with unstable angina pectoris: Secondary | ICD-10-CM | POA: Diagnosis not present

## 2017-01-24 DIAGNOSIS — I255 Ischemic cardiomyopathy: Secondary | ICD-10-CM | POA: Diagnosis not present

## 2017-01-24 DIAGNOSIS — I252 Old myocardial infarction: Secondary | ICD-10-CM | POA: Diagnosis not present

## 2017-01-24 DIAGNOSIS — I5023 Acute on chronic systolic (congestive) heart failure: Secondary | ICD-10-CM | POA: Diagnosis not present

## 2017-01-24 DIAGNOSIS — I130 Hypertensive heart and chronic kidney disease with heart failure and stage 1 through stage 4 chronic kidney disease, or unspecified chronic kidney disease: Secondary | ICD-10-CM | POA: Diagnosis not present

## 2017-01-24 DIAGNOSIS — N183 Chronic kidney disease, stage 3 (moderate): Secondary | ICD-10-CM | POA: Diagnosis not present

## 2017-01-25 ENCOUNTER — Ambulatory Visit (INDEPENDENT_AMBULATORY_CARE_PROVIDER_SITE_OTHER): Payer: Medicare Other | Admitting: *Deleted

## 2017-01-25 DIAGNOSIS — I255 Ischemic cardiomyopathy: Secondary | ICD-10-CM | POA: Diagnosis not present

## 2017-01-25 NOTE — Progress Notes (Signed)
Remote ICD transmission.   

## 2017-01-26 ENCOUNTER — Other Ambulatory Visit: Payer: Self-pay | Admitting: Internal Medicine

## 2017-01-27 ENCOUNTER — Other Ambulatory Visit (HOSPITAL_COMMUNITY): Payer: Self-pay | Admitting: Cardiology

## 2017-01-31 DIAGNOSIS — I5023 Acute on chronic systolic (congestive) heart failure: Secondary | ICD-10-CM | POA: Diagnosis not present

## 2017-01-31 DIAGNOSIS — I130 Hypertensive heart and chronic kidney disease with heart failure and stage 1 through stage 4 chronic kidney disease, or unspecified chronic kidney disease: Secondary | ICD-10-CM | POA: Diagnosis not present

## 2017-01-31 DIAGNOSIS — I25110 Atherosclerotic heart disease of native coronary artery with unstable angina pectoris: Secondary | ICD-10-CM | POA: Diagnosis not present

## 2017-01-31 DIAGNOSIS — I255 Ischemic cardiomyopathy: Secondary | ICD-10-CM | POA: Diagnosis not present

## 2017-01-31 DIAGNOSIS — N183 Chronic kidney disease, stage 3 (moderate): Secondary | ICD-10-CM | POA: Diagnosis not present

## 2017-01-31 DIAGNOSIS — I252 Old myocardial infarction: Secondary | ICD-10-CM | POA: Diagnosis not present

## 2017-02-02 ENCOUNTER — Encounter: Payer: Self-pay | Admitting: Cardiology

## 2017-02-07 DIAGNOSIS — I5023 Acute on chronic systolic (congestive) heart failure: Secondary | ICD-10-CM | POA: Diagnosis not present

## 2017-02-07 DIAGNOSIS — I25110 Atherosclerotic heart disease of native coronary artery with unstable angina pectoris: Secondary | ICD-10-CM | POA: Diagnosis not present

## 2017-02-07 DIAGNOSIS — N183 Chronic kidney disease, stage 3 (moderate): Secondary | ICD-10-CM | POA: Diagnosis not present

## 2017-02-07 DIAGNOSIS — I130 Hypertensive heart and chronic kidney disease with heart failure and stage 1 through stage 4 chronic kidney disease, or unspecified chronic kidney disease: Secondary | ICD-10-CM | POA: Diagnosis not present

## 2017-02-07 DIAGNOSIS — I252 Old myocardial infarction: Secondary | ICD-10-CM | POA: Diagnosis not present

## 2017-02-07 DIAGNOSIS — I255 Ischemic cardiomyopathy: Secondary | ICD-10-CM | POA: Diagnosis not present

## 2017-02-08 ENCOUNTER — Other Ambulatory Visit: Payer: Self-pay | Admitting: Family Medicine

## 2017-02-09 ENCOUNTER — Other Ambulatory Visit (HOSPITAL_COMMUNITY): Payer: Self-pay | Admitting: Cardiology

## 2017-02-13 LAB — CUP PACEART INCLINIC DEVICE CHECK
Battery Remaining Longevity: 30 mo
Brady Statistic RA Percent Paced: 0.01 %
HighPow Impedance: 81 Ohm
Implantable Lead Implant Date: 20121107
Implantable Lead Location: 753859
Implantable Lead Model: 7000
Implantable Pulse Generator Implant Date: 20121107
Lead Channel Impedance Value: 300 Ohm
Lead Channel Pacing Threshold Amplitude: 1.25 V
Lead Channel Pacing Threshold Pulse Width: 0.5 ms
Lead Channel Sensing Intrinsic Amplitude: 5 mV
Lead Channel Setting Pacing Amplitude: 2.5 V
Lead Channel Setting Pacing Pulse Width: 0.7 ms
Lead Channel Setting Sensing Sensitivity: 0.5 mV
MDC IDC LEAD IMPLANT DT: 20121107
MDC IDC LEAD LOCATION: 753860
MDC IDC MSMT LEADCHNL RA PACING THRESHOLD AMPLITUDE: 0.75 V
MDC IDC MSMT LEADCHNL RV IMPEDANCE VALUE: 525 Ohm
MDC IDC MSMT LEADCHNL RV PACING THRESHOLD PULSEWIDTH: 0.7 ms
MDC IDC MSMT LEADCHNL RV SENSING INTR AMPL: 11.5 mV
MDC IDC SESS DTM: 20190104145831
MDC IDC SET LEADCHNL RA PACING AMPLITUDE: 2 V
MDC IDC STAT BRADY RV PERCENT PACED: 0 %
Pulse Gen Serial Number: 1033795

## 2017-02-14 DIAGNOSIS — I255 Ischemic cardiomyopathy: Secondary | ICD-10-CM | POA: Diagnosis not present

## 2017-02-14 DIAGNOSIS — I252 Old myocardial infarction: Secondary | ICD-10-CM | POA: Diagnosis not present

## 2017-02-14 DIAGNOSIS — I5023 Acute on chronic systolic (congestive) heart failure: Secondary | ICD-10-CM | POA: Diagnosis not present

## 2017-02-14 DIAGNOSIS — I130 Hypertensive heart and chronic kidney disease with heart failure and stage 1 through stage 4 chronic kidney disease, or unspecified chronic kidney disease: Secondary | ICD-10-CM | POA: Diagnosis not present

## 2017-02-14 DIAGNOSIS — N183 Chronic kidney disease, stage 3 (moderate): Secondary | ICD-10-CM | POA: Diagnosis not present

## 2017-02-14 DIAGNOSIS — I25110 Atherosclerotic heart disease of native coronary artery with unstable angina pectoris: Secondary | ICD-10-CM | POA: Diagnosis not present

## 2017-02-15 DIAGNOSIS — I25110 Atherosclerotic heart disease of native coronary artery with unstable angina pectoris: Secondary | ICD-10-CM | POA: Diagnosis not present

## 2017-02-15 DIAGNOSIS — I252 Old myocardial infarction: Secondary | ICD-10-CM | POA: Diagnosis not present

## 2017-02-15 DIAGNOSIS — I5023 Acute on chronic systolic (congestive) heart failure: Secondary | ICD-10-CM | POA: Diagnosis not present

## 2017-02-15 DIAGNOSIS — I130 Hypertensive heart and chronic kidney disease with heart failure and stage 1 through stage 4 chronic kidney disease, or unspecified chronic kidney disease: Secondary | ICD-10-CM | POA: Diagnosis not present

## 2017-02-15 DIAGNOSIS — N183 Chronic kidney disease, stage 3 (moderate): Secondary | ICD-10-CM | POA: Diagnosis not present

## 2017-02-15 DIAGNOSIS — I255 Ischemic cardiomyopathy: Secondary | ICD-10-CM | POA: Diagnosis not present

## 2017-02-19 ENCOUNTER — Encounter (HOSPITAL_COMMUNITY): Payer: Self-pay

## 2017-02-19 ENCOUNTER — Ambulatory Visit (HOSPITAL_COMMUNITY)
Admission: RE | Admit: 2017-02-19 | Discharge: 2017-02-19 | Disposition: A | Discharge status: Home / Self Care | Payer: Medicare Other | Source: Ambulatory Visit | Attending: Cardiology | Admitting: Cardiology

## 2017-02-19 ENCOUNTER — Encounter: Payer: Self-pay | Admitting: Internal Medicine

## 2017-02-19 VITALS — BP 154/86 | HR 95 | Wt 159.8 lb

## 2017-02-19 DIAGNOSIS — R002 Palpitations: Secondary | ICD-10-CM | POA: Diagnosis not present

## 2017-02-19 DIAGNOSIS — Z79899 Other long term (current) drug therapy: Secondary | ICD-10-CM | POA: Insufficient documentation

## 2017-02-19 DIAGNOSIS — I25118 Atherosclerotic heart disease of native coronary artery with other forms of angina pectoris: Secondary | ICD-10-CM | POA: Diagnosis not present

## 2017-02-19 DIAGNOSIS — Z955 Presence of coronary angioplasty implant and graft: Secondary | ICD-10-CM | POA: Insufficient documentation

## 2017-02-19 DIAGNOSIS — I252 Old myocardial infarction: Secondary | ICD-10-CM | POA: Insufficient documentation

## 2017-02-19 DIAGNOSIS — I255 Ischemic cardiomyopathy: Secondary | ICD-10-CM | POA: Insufficient documentation

## 2017-02-19 DIAGNOSIS — Z95810 Presence of automatic (implantable) cardiac defibrillator: Secondary | ICD-10-CM | POA: Insufficient documentation

## 2017-02-19 DIAGNOSIS — Z7982 Long term (current) use of aspirin: Secondary | ICD-10-CM | POA: Insufficient documentation

## 2017-02-19 DIAGNOSIS — Z9889 Other specified postprocedural states: Secondary | ICD-10-CM | POA: Diagnosis not present

## 2017-02-19 DIAGNOSIS — Z8249 Family history of ischemic heart disease and other diseases of the circulatory system: Secondary | ICD-10-CM | POA: Diagnosis not present

## 2017-02-19 DIAGNOSIS — I130 Hypertensive heart and chronic kidney disease with heart failure and stage 1 through stage 4 chronic kidney disease, or unspecified chronic kidney disease: Secondary | ICD-10-CM | POA: Diagnosis not present

## 2017-02-19 DIAGNOSIS — N183 Chronic kidney disease, stage 3 unspecified: Secondary | ICD-10-CM

## 2017-02-19 DIAGNOSIS — I10 Essential (primary) hypertension: Secondary | ICD-10-CM

## 2017-02-19 DIAGNOSIS — M109 Gout, unspecified: Secondary | ICD-10-CM | POA: Diagnosis not present

## 2017-02-19 DIAGNOSIS — I493 Ventricular premature depolarization: Secondary | ICD-10-CM | POA: Diagnosis not present

## 2017-02-19 DIAGNOSIS — Z809 Family history of malignant neoplasm, unspecified: Secondary | ICD-10-CM | POA: Diagnosis not present

## 2017-02-19 DIAGNOSIS — E785 Hyperlipidemia, unspecified: Secondary | ICD-10-CM | POA: Insufficient documentation

## 2017-02-19 DIAGNOSIS — I5023 Acute on chronic systolic (congestive) heart failure: Secondary | ICD-10-CM | POA: Insufficient documentation

## 2017-02-19 DIAGNOSIS — I2510 Atherosclerotic heart disease of native coronary artery without angina pectoris: Secondary | ICD-10-CM | POA: Insufficient documentation

## 2017-02-19 DIAGNOSIS — I5020 Unspecified systolic (congestive) heart failure: Secondary | ICD-10-CM | POA: Diagnosis not present

## 2017-02-19 NOTE — Progress Notes (Signed)
Patient ID: Marvin Martin, male   DOB: [birth year], 52 y.o.   MRN: [id number]    Advanced Heart Failure Clinic Note   Primary Care: Dr Laney Pastor Primary Cardiologist: Dr. Haroldine Laws  Nephrologist: Dr Joelyn Oms   HPI: Marvin Martin is a 52 year old male with history of CAD, ICM, MI X4  with 17 stents, chronic systolic HF with EF 78% s/p St. Jude ICD, HTN, gout, and hyperlipidemia.  He just relocated to [facility] from [place]. His medical care was provided by [facility]. He has struggled with in-stent restenosis. Most recent heart cath was February 2016 with stent placed.   Admitted May 8th through May 12th, 2017 with chest pain. Had LHC as noted below no intervention.   Admitted June 5th through June 14th, 2956 with A/C systolic CHF. Had repeat R/LHC as below with stable CAD and low output. Started on milrinone and weaned to 0.125 mcg/kg/min for d/c. Coreg, lasix, and spiro stopped.  Entresto dropped from full dose to 24/26 mg, subsequently stopped.   Pt presents today for follow up. At last visit Hydralazine increased. Med titration has been limited by renal function. He is feeling great. SBP at home has been 110-120s, highest he has seen is 146, but has been sometime since then. He misses his afternoon hydralazine 1-2 times a week. Watching fluid and salt. Hasn't needed any lasix. Denies lightheadedness or dizziness. Denies palpitations or CP.   01/2015 ECHO  EF 20-25% RV normal  03/21/15: EF 15-20% RV ok  03/2016: Echo EF 15-20%.   Corevue: Thoracic impedence above threshold, with no recent crossing. No VT/VF. Pt activity not shown.    R/LHC 06/23/15  Dist RCA-1 lesion, 100% stenosed. The lesion was previously treated with a stent (unknown type).  Dist RCA-2 lesion, 100% stenosed. The lesion was previously treated with a stent (unknown type).  Prox RCA lesion, 70% stenosed. The lesion was previously treated with a stent (unknown type) and angioplasty.  Mid RCA lesion, 50% stenosed.  The lesion was previously treated with a stent (unknown type).  Ost RPDA to RPDA lesion, 75% stenosed. The lesion was previously treated with a drug-eluting stent.  Mid LAD lesion, 70% stenosed.  Ost 2nd Diag lesion, 70% stenosed. Ao = 87/62 (71) LV = 81/9 RA = 2 RV = 20/0/2 PA = 24/12 (16) PCW = 4 Fick cardiac output/index = 3.1/1.8 Thermo CO/CI = 3.0/1.7 PVR = 3.9 WU Ao sat = 97% PA sat = 54%, 55%  R/LHC 05/27/15  Dist RCA-1 lesion, 100% stenosed. The lesion was previously treated with a stent (unknown type).  Dist RCA-2 lesion, 100% stenosed. The lesion was previously treated with a stent (unknown type).  Prox RCA lesion, 60% stenosed. The lesion was previously treated with a stent (unknown type) and angioplasty.  Mid RCA lesion, 40% stenosed. The lesion was previously treated with a stent (unknown type). Findings: RA = 1 RV = 22/1/5 PA = 27/13 (19) PCW = 6 Ao = 120/78 (96) LV = 124/4/13 Fick cardiac output/index = 3.3/1.9 PVR = 4.0 WU SVR = 2335  FA sat = 94% PA sat = 60%. 60%  CPX 01/15/15 FVC 2.67 (65%)    FEV1 2.18 (64%)     FEV1/FVC 81 (100%)     MVV 105 (73%) Peak VO2: 20.3 (56.3% predicted peak VO2) VE/VCO2 slope: 26.8 OUES: 1.52 Peak RER: 1.21  CPX 06/2015  Peak VO2: 19.9 (55.8% predicted peak VO2) VE/VCO2 slope: 31.6 OUES: 1.42 Peak RER: 1.26  Labs: 05/08/2016:  Creatinine 2.44 K 4.8   SH: Separated from his wife. Has 4 children. 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         Father deceased but he is unsure of the cause. He had hyperlipidemia.         Sister: Heart disease  [facility]  [address] [place] [address] [phone]  Past Medical History:  Diagnosis Date  . Chronic systolic CHF (congestive heart failure) ([place])    a. 01/2015 Echo: EF 20-25%, antlat, inflat AK.  [name] CKD (chronic kidney disease), stage III ([place])   . Coronary artery disease    a. s/p MI x 4;  b.  Reported h/o 17 stents with recurrent ISR;  c. 02/2014 Cath ([place]): PCI to unknown vessel;  d. 01/2015 MV: EF 18%, large scar, no ischemia; e. 03/2015 PCI: LM nl, LAD nl, D2 patent stent, RI patent stent, LCX patent stents p/m, RCA 95p ISR (PTCA), 58m, 100d into RPL CTO, RPDA 90 (2.5x16 Synergy DES).  . Hyperlipidemia   . Hypertensive heart disease   . Ischemic cardiomyopathy    a. s/p SJM ICD 2007 w/ gen change in 2012; b. 12/2014 CPX Test: mild to mod HF limitation w/ pVO2 20.3 and nl slope;  c. 01/2015 Echo: EF 20-25%.   Current Outpatient Medications  Medication Sig Dispense Refill  . allopurinol (ZYLOPRIM) 100 MG tablet Take 100 mg by mouth 2 (two) times daily.     . AMBULATORY NON FORMULARY MEDICATION Inject 0.125 mcg/kg/min into the vein continuous. Medication Name: Milrinone    . amLODipine (NORVASC) 10 MG tablet TAKE 1 TABLET (10 MG TOTAL) BY MOUTH DAILY. 30 tablet 11  . aspirin 81 MG tablet Take 81 mg by mouth daily.    [name] BRILINTA 90 MG TABS tablet TAKE 1 TABLET (90 MG TOTAL) BY MOUTH 2 (TWO) TIMES DAILY. 180 tablet 1  . cholecalciferol (VITAMIN D) 1000 UNITS tablet Take 1,000 Units by mouth daily.    [name] 5 MG TABS tablet TAKE 1 TABLET (5 MG TOTAL) BY MOUTH 2 (TWO) TIMES DAILY WITH A MEAL. 60 tablet 3  . ENTRESTO 24-26 MG TAKE 1 TABLET BY MOUTH TWICE A DAY 60 tablet 3  . ezetimibe (ZETIA) 10 MG tablet TAKE 1 TABLET BY MOUTH EVERY DAY 90 tablet 0  . hydrALAZINE (APRESOLINE) 50 MG tablet Take 1 tablet (50 mg total) by mouth 3 (three) times daily. 90 tablet 3  . ivabradine (CORLANOR) 5 MG TABS tablet Take 1 tablet (5 mg total) by mouth 2 (two) times daily with a meal. 60 tablet 3  . Magnesium Oxide 200 MG TABS Take 1 tablet (200 mg total) by mouth 2 (two) times daily. 180 tablet 3  . nitroGLYCERIN (NITROSTAT) 0.4 MG SL tablet Place 1 tablet (0.4 mg total) under the tongue every 5 (five) minutes as needed for chest pain. Reported on 04/12/2015 60 tablet 1  . omega-3 acid ethyl esters  (LOVAZA) 1 G capsule Take 1 g by mouth daily.     . ondansetron (ZOFRAN) 4 MG tablet Take 1 tablet (4 mg total) every 8 (eight) hours as needed by mouth for nausea or vomiting. Reported on 05/24/2015 30 tablet 1  . PRALUENT 75 MG/ML SOPN INJECT 75MG  (1 PEN) SUBCUTANEOUSLY EVERY 14 DAYS 2 pen 11  . rosuvastatin (CRESTOR) 40 MG tablet TAKE 1 TABLET BY MOUTH DAILY 90 tablet 3  . isosorbide mononitrate (IMDUR) 30 MG 24 hr tablet  Take 1 tablet (30 mg total) daily by mouth. 90 tablet 3   No current facility-administered medications for this encounter.    No Known Allergies   Social History   Socioeconomic History  . Marital status: Legally Separated    Spouse name: Not on file  . Number of children: Not on file  . Years of education: Not on file  . Highest education level: Not on file  Social Needs  . Financial resource strain: Not on file  . Food insecurity - worry: Not on file  . Food insecurity - inability: Not on file  . Transportation needs - medical: Not on file  . Transportation needs - non-medical: Not on file  Occupational History  . Not on file  Tobacco Use  . Smoking status: Never Smoker  . Smokeless tobacco: Never Used  Substance and Sexual Activity  . Alcohol use: No    Alcohol/week: 0.0 oz  . Drug use: No  . Sexual activity: No  Other Topics Concern  . Not on file  Social History Narrative   Moved to [place] from [place] in 2016.  Lives alone in [place].    Family History  Problem Relation Age of Onset  . Cancer Mother        died when pt was 89.  She had a h/o heart dzs as well.  . Hypertension Mother   . Hyperlipidemia Father        Father died when pt was age 48 - he believes that he had a cardiac hx.  [name] CAD Sister    Vitals:   02/19/17 1003  BP: (!) 154/86  Pulse: 95  SpO2: 98%  Weight: 159 lb 12.8 oz (72.5 kg)     Wt Readings from Last 3 Encounters:  02/19/17 159 lb 12.8 oz (72.5 kg)  01/22/17 159 lb 14.4 oz (72.5 kg)  01/19/17 155 lb (70.3 kg)    EKG: NSR 92  bpm with PVCs, personally reviewed.   PHYSICAL EXAM: General: NAD.  HEENT: Normal Neck: Supple. JVP 5-6. Carotids 2+ bilat; no bruits. No thyromegaly or nodule noted. Cor: PMI nondisplaced. RRR with occasional ectopy, No M/G/R noted Lungs: CTAB, normal effort. Abdomen: Soft, non-tender, non-distended, no HSM. No bruits or masses. +BS  Extremities: No cyanosis, clubbing, or rash. R and LLE no edema.  Neuro: Alert & orientedx3, cranial nerves grossly intact. moves all 4 extremities w/o difficulty. Affect pleasant   ASSESSMENT & PLAN: 1. Chronic Systolic Heart Failure - ICM.  EF 15-20% on echo 3/18. RV ok. Has ST Jude ICD - NYHA II on chronic milrinone 0.25 mcg via PICC.  - Volume status stable on exam and Corevue.  - Continue lasix as needed.  - No BB low output. - Continue corlanor 5 mg BID - Continue entresto 24-26 mg BID. Will not up titrate with CKD.  - Continue hydralazine 50 mg TID. He took his morning meds just prior (5-10 minutes) to arrival. Pt knows to continue to check BP, and call for instruction if > 140.  - Continue imdur 30 mg daily.  - Not a candidate for transplant/advanced therapies due to lack of social support. Also his renal function prohibits VAD.  - He understand milrinone will not last forever.  - Reinforced fluid restriction to < 2 L daily, sodium restriction to less than 2000 mg daily, and the importance of daily weights.   2. CAD- MI X4 17 stents  Most recent [place] 06/2015 - no intervention continued medical management.  Dist RCA-1 lesion, 100% stenosed. The lesion was previously treated with a stent (unknown type).  Dist RCA-2 lesion, 100% stenosed. The lesion was previously treated with a stent (unknown type).  Prox RCA lesion, 70% stenosed. The lesion was previously treated with a stent (unknown type) and angioplasty.  Mid RCA lesion, 50% stenosed. The lesion was previously treated with a stent (unknown type).  Ost RPDA to RPDA lesion, 75% stenosed.  The lesion was previously treated with a drug-eluting stent.  Mid LAD lesion, 70% stenosed.  Ost 2nd Diag lesion, 70% stenosed. - No BB with low output on home milrinone. - No s/s of ischemia.  - Continue daily crestor.  3. HTN - Meds as above.  4. Hyperlipidemia - On Praulent and statin.  5. CKD stage III  - Creatinine base 2.2-2.4. Continue to follow closely via [place].  6. Palpitations - Resolved. PVC noted on EKG. If recurrent will check holter monitor.  Continue AHC for home milrinone and labs. Overall doing very well. RTC 6-8 weeks, sooner with symptoms.   Shirley Friar, PA-C  10:46 AM

## 2017-02-19 NOTE — Patient Instructions (Addendum)
No changes to medication at this time.  Follow up 6-8 weeks with Dr. Haroldine Laws.  __________________________________________________________ Marvin Martin Code: 9002  Take all medication as prescribed the day of your appointment. Bring all medications with you to your appointment.  Do the following things EVERYDAY: 1) Weigh yourself in the morning before breakfast. Write it down and keep it in a log. 2) Take your medicines as prescribed 3) Eat low salt foods-Limit salt (sodium) to 2000 mg per day.  4) Stay as active as you can everyday 5) Limit all fluids for the day to less than 2 liters

## 2017-02-20 DIAGNOSIS — I255 Ischemic cardiomyopathy: Secondary | ICD-10-CM | POA: Diagnosis not present

## 2017-02-20 DIAGNOSIS — E785 Hyperlipidemia, unspecified: Secondary | ICD-10-CM | POA: Diagnosis not present

## 2017-02-20 DIAGNOSIS — Z7901 Long term (current) use of anticoagulants: Secondary | ICD-10-CM | POA: Diagnosis not present

## 2017-02-20 DIAGNOSIS — Z955 Presence of coronary angioplasty implant and graft: Secondary | ICD-10-CM | POA: Diagnosis not present

## 2017-02-20 DIAGNOSIS — I130 Hypertensive heart and chronic kidney disease with heart failure and stage 1 through stage 4 chronic kidney disease, or unspecified chronic kidney disease: Secondary | ICD-10-CM | POA: Diagnosis not present

## 2017-02-20 DIAGNOSIS — Z452 Encounter for adjustment and management of vascular access device: Secondary | ICD-10-CM | POA: Diagnosis not present

## 2017-02-20 DIAGNOSIS — I25110 Atherosclerotic heart disease of native coronary artery with unstable angina pectoris: Secondary | ICD-10-CM | POA: Diagnosis not present

## 2017-02-20 DIAGNOSIS — Z5181 Encounter for therapeutic drug level monitoring: Secondary | ICD-10-CM | POA: Diagnosis not present

## 2017-02-20 DIAGNOSIS — Z950 Presence of cardiac pacemaker: Secondary | ICD-10-CM | POA: Diagnosis not present

## 2017-02-20 DIAGNOSIS — N183 Chronic kidney disease, stage 3 (moderate): Secondary | ICD-10-CM | POA: Diagnosis not present

## 2017-02-20 DIAGNOSIS — I5023 Acute on chronic systolic (congestive) heart failure: Secondary | ICD-10-CM | POA: Diagnosis not present

## 2017-02-21 DIAGNOSIS — I5023 Acute on chronic systolic (congestive) heart failure: Secondary | ICD-10-CM | POA: Diagnosis not present

## 2017-02-21 DIAGNOSIS — E785 Hyperlipidemia, unspecified: Secondary | ICD-10-CM | POA: Diagnosis not present

## 2017-02-21 DIAGNOSIS — I255 Ischemic cardiomyopathy: Secondary | ICD-10-CM | POA: Diagnosis not present

## 2017-02-21 DIAGNOSIS — I130 Hypertensive heart and chronic kidney disease with heart failure and stage 1 through stage 4 chronic kidney disease, or unspecified chronic kidney disease: Secondary | ICD-10-CM | POA: Diagnosis not present

## 2017-02-21 DIAGNOSIS — I25110 Atherosclerotic heart disease of native coronary artery with unstable angina pectoris: Secondary | ICD-10-CM | POA: Diagnosis not present

## 2017-02-21 DIAGNOSIS — N183 Chronic kidney disease, stage 3 (moderate): Secondary | ICD-10-CM | POA: Diagnosis not present

## 2017-02-22 ENCOUNTER — Other Ambulatory Visit: Payer: Self-pay | Admitting: Family Medicine

## 2017-02-22 ENCOUNTER — Other Ambulatory Visit (HOSPITAL_COMMUNITY): Payer: Self-pay | Admitting: Cardiology

## 2017-02-22 ENCOUNTER — Other Ambulatory Visit: Payer: Self-pay | Admitting: Internal Medicine

## 2017-02-25 ENCOUNTER — Other Ambulatory Visit (HOSPITAL_COMMUNITY): Payer: Self-pay | Admitting: Internal Medicine

## 2017-02-26 DIAGNOSIS — I25110 Atherosclerotic heart disease of native coronary artery with unstable angina pectoris: Secondary | ICD-10-CM | POA: Diagnosis not present

## 2017-02-26 DIAGNOSIS — E785 Hyperlipidemia, unspecified: Secondary | ICD-10-CM | POA: Diagnosis not present

## 2017-02-26 DIAGNOSIS — I5023 Acute on chronic systolic (congestive) heart failure: Secondary | ICD-10-CM | POA: Diagnosis not present

## 2017-02-26 DIAGNOSIS — I255 Ischemic cardiomyopathy: Secondary | ICD-10-CM | POA: Diagnosis not present

## 2017-02-26 DIAGNOSIS — I130 Hypertensive heart and chronic kidney disease with heart failure and stage 1 through stage 4 chronic kidney disease, or unspecified chronic kidney disease: Secondary | ICD-10-CM | POA: Diagnosis not present

## 2017-02-26 DIAGNOSIS — N183 Chronic kidney disease, stage 3 (moderate): Secondary | ICD-10-CM | POA: Diagnosis not present

## 2017-02-27 LAB — CUP PACEART REMOTE DEVICE CHECK
Implantable Lead Implant Date: 20121107
Implantable Lead Location: 753859
Implantable Lead Location: 753860
Implantable Lead Model: 7000
Implantable Pulse Generator Implant Date: 20121107
MDC IDC LEAD IMPLANT DT: 20121107
MDC IDC SESS DTM: 20190212145011
Pulse Gen Serial Number: 1033795

## 2017-02-28 DIAGNOSIS — N183 Chronic kidney disease, stage 3 (moderate): Secondary | ICD-10-CM | POA: Diagnosis not present

## 2017-02-28 DIAGNOSIS — I255 Ischemic cardiomyopathy: Secondary | ICD-10-CM | POA: Diagnosis not present

## 2017-02-28 DIAGNOSIS — I130 Hypertensive heart and chronic kidney disease with heart failure and stage 1 through stage 4 chronic kidney disease, or unspecified chronic kidney disease: Secondary | ICD-10-CM | POA: Diagnosis not present

## 2017-02-28 DIAGNOSIS — I5023 Acute on chronic systolic (congestive) heart failure: Secondary | ICD-10-CM | POA: Diagnosis not present

## 2017-02-28 DIAGNOSIS — E785 Hyperlipidemia, unspecified: Secondary | ICD-10-CM | POA: Diagnosis not present

## 2017-02-28 DIAGNOSIS — I25110 Atherosclerotic heart disease of native coronary artery with unstable angina pectoris: Secondary | ICD-10-CM | POA: Diagnosis not present

## 2017-03-03 ENCOUNTER — Other Ambulatory Visit: Payer: Self-pay | Admitting: Family Medicine

## 2017-03-04 ENCOUNTER — Other Ambulatory Visit (HOSPITAL_COMMUNITY): Payer: Self-pay | Admitting: Internal Medicine

## 2017-03-07 DIAGNOSIS — E785 Hyperlipidemia, unspecified: Secondary | ICD-10-CM | POA: Diagnosis not present

## 2017-03-07 DIAGNOSIS — I255 Ischemic cardiomyopathy: Secondary | ICD-10-CM | POA: Diagnosis not present

## 2017-03-07 DIAGNOSIS — N183 Chronic kidney disease, stage 3 (moderate): Secondary | ICD-10-CM | POA: Diagnosis not present

## 2017-03-07 DIAGNOSIS — I25110 Atherosclerotic heart disease of native coronary artery with unstable angina pectoris: Secondary | ICD-10-CM | POA: Diagnosis not present

## 2017-03-07 DIAGNOSIS — I130 Hypertensive heart and chronic kidney disease with heart failure and stage 1 through stage 4 chronic kidney disease, or unspecified chronic kidney disease: Secondary | ICD-10-CM | POA: Diagnosis not present

## 2017-03-07 DIAGNOSIS — I5023 Acute on chronic systolic (congestive) heart failure: Secondary | ICD-10-CM | POA: Diagnosis not present

## 2017-03-09 ENCOUNTER — Other Ambulatory Visit (HOSPITAL_COMMUNITY): Payer: Self-pay | Admitting: Internal Medicine

## 2017-03-14 DIAGNOSIS — N183 Chronic kidney disease, stage 3 (moderate): Secondary | ICD-10-CM | POA: Diagnosis not present

## 2017-03-14 DIAGNOSIS — I25110 Atherosclerotic heart disease of native coronary artery with unstable angina pectoris: Secondary | ICD-10-CM | POA: Diagnosis not present

## 2017-03-14 DIAGNOSIS — I5023 Acute on chronic systolic (congestive) heart failure: Secondary | ICD-10-CM | POA: Diagnosis not present

## 2017-03-14 DIAGNOSIS — E785 Hyperlipidemia, unspecified: Secondary | ICD-10-CM | POA: Diagnosis not present

## 2017-03-14 DIAGNOSIS — I255 Ischemic cardiomyopathy: Secondary | ICD-10-CM | POA: Diagnosis not present

## 2017-03-14 DIAGNOSIS — I130 Hypertensive heart and chronic kidney disease with heart failure and stage 1 through stage 4 chronic kidney disease, or unspecified chronic kidney disease: Secondary | ICD-10-CM | POA: Diagnosis not present

## 2017-03-16 ENCOUNTER — Other Ambulatory Visit (HOSPITAL_COMMUNITY): Payer: Self-pay | Admitting: Internal Medicine

## 2017-03-21 DIAGNOSIS — I130 Hypertensive heart and chronic kidney disease with heart failure and stage 1 through stage 4 chronic kidney disease, or unspecified chronic kidney disease: Secondary | ICD-10-CM | POA: Diagnosis not present

## 2017-03-21 DIAGNOSIS — E785 Hyperlipidemia, unspecified: Secondary | ICD-10-CM | POA: Diagnosis not present

## 2017-03-21 DIAGNOSIS — I5023 Acute on chronic systolic (congestive) heart failure: Secondary | ICD-10-CM | POA: Diagnosis not present

## 2017-03-21 DIAGNOSIS — I25110 Atherosclerotic heart disease of native coronary artery with unstable angina pectoris: Secondary | ICD-10-CM | POA: Diagnosis not present

## 2017-03-21 DIAGNOSIS — N183 Chronic kidney disease, stage 3 (moderate): Secondary | ICD-10-CM | POA: Diagnosis not present

## 2017-03-21 DIAGNOSIS — I255 Ischemic cardiomyopathy: Secondary | ICD-10-CM | POA: Diagnosis not present

## 2017-03-28 DIAGNOSIS — E785 Hyperlipidemia, unspecified: Secondary | ICD-10-CM | POA: Diagnosis not present

## 2017-03-28 DIAGNOSIS — N183 Chronic kidney disease, stage 3 (moderate): Secondary | ICD-10-CM | POA: Diagnosis not present

## 2017-03-28 DIAGNOSIS — I255 Ischemic cardiomyopathy: Secondary | ICD-10-CM | POA: Diagnosis not present

## 2017-03-28 DIAGNOSIS — I130 Hypertensive heart and chronic kidney disease with heart failure and stage 1 through stage 4 chronic kidney disease, or unspecified chronic kidney disease: Secondary | ICD-10-CM | POA: Diagnosis not present

## 2017-03-28 DIAGNOSIS — I5023 Acute on chronic systolic (congestive) heart failure: Secondary | ICD-10-CM | POA: Diagnosis not present

## 2017-03-28 DIAGNOSIS — I25110 Atherosclerotic heart disease of native coronary artery with unstable angina pectoris: Secondary | ICD-10-CM | POA: Diagnosis not present

## 2017-03-30 ENCOUNTER — Other Ambulatory Visit (HOSPITAL_COMMUNITY): Payer: Self-pay | Admitting: Internal Medicine

## 2017-04-04 DIAGNOSIS — I25110 Atherosclerotic heart disease of native coronary artery with unstable angina pectoris: Secondary | ICD-10-CM | POA: Diagnosis not present

## 2017-04-04 DIAGNOSIS — I130 Hypertensive heart and chronic kidney disease with heart failure and stage 1 through stage 4 chronic kidney disease, or unspecified chronic kidney disease: Secondary | ICD-10-CM | POA: Diagnosis not present

## 2017-04-04 DIAGNOSIS — I255 Ischemic cardiomyopathy: Secondary | ICD-10-CM | POA: Diagnosis not present

## 2017-04-04 DIAGNOSIS — I5023 Acute on chronic systolic (congestive) heart failure: Secondary | ICD-10-CM | POA: Diagnosis not present

## 2017-04-04 DIAGNOSIS — E785 Hyperlipidemia, unspecified: Secondary | ICD-10-CM | POA: Diagnosis not present

## 2017-04-04 DIAGNOSIS — N183 Chronic kidney disease, stage 3 (moderate): Secondary | ICD-10-CM | POA: Diagnosis not present

## 2017-04-10 ENCOUNTER — Encounter: Payer: Self-pay | Admitting: Internal Medicine

## 2017-04-10 ENCOUNTER — Other Ambulatory Visit: Payer: Self-pay

## 2017-04-10 ENCOUNTER — Ambulatory Visit (HOSPITAL_COMMUNITY)
Admission: RE | Admit: 2017-04-10 | Discharge: 2017-04-10 | Disposition: A | Discharge status: Home / Self Care | Payer: Medicare Other | Source: Ambulatory Visit | Attending: Internal Medicine | Admitting: Internal Medicine

## 2017-04-10 VITALS — BP 140/86 | HR 59 | Wt 159.5 lb

## 2017-04-10 DIAGNOSIS — M109 Gout, unspecified: Secondary | ICD-10-CM | POA: Insufficient documentation

## 2017-04-10 DIAGNOSIS — I2510 Atherosclerotic heart disease of native coronary artery without angina pectoris: Secondary | ICD-10-CM | POA: Insufficient documentation

## 2017-04-10 DIAGNOSIS — E785 Hyperlipidemia, unspecified: Secondary | ICD-10-CM | POA: Diagnosis not present

## 2017-04-10 DIAGNOSIS — Z955 Presence of coronary angioplasty implant and graft: Secondary | ICD-10-CM | POA: Insufficient documentation

## 2017-04-10 DIAGNOSIS — I252 Old myocardial infarction: Secondary | ICD-10-CM | POA: Insufficient documentation

## 2017-04-10 DIAGNOSIS — I130 Hypertensive heart and chronic kidney disease with heart failure and stage 1 through stage 4 chronic kidney disease, or unspecified chronic kidney disease: Secondary | ICD-10-CM | POA: Diagnosis not present

## 2017-04-10 DIAGNOSIS — I10 Essential (primary) hypertension: Secondary | ICD-10-CM | POA: Diagnosis not present

## 2017-04-10 DIAGNOSIS — N183 Chronic kidney disease, stage 3 unspecified: Secondary | ICD-10-CM

## 2017-04-10 DIAGNOSIS — I255 Ischemic cardiomyopathy: Secondary | ICD-10-CM | POA: Insufficient documentation

## 2017-04-10 DIAGNOSIS — I25118 Atherosclerotic heart disease of native coronary artery with other forms of angina pectoris: Secondary | ICD-10-CM | POA: Diagnosis not present

## 2017-04-10 DIAGNOSIS — Z7982 Long term (current) use of aspirin: Secondary | ICD-10-CM | POA: Insufficient documentation

## 2017-04-10 DIAGNOSIS — I5022 Chronic systolic (congestive) heart failure: Secondary | ICD-10-CM | POA: Insufficient documentation

## 2017-04-10 DIAGNOSIS — Z79899 Other long term (current) drug therapy: Secondary | ICD-10-CM | POA: Diagnosis not present

## 2017-04-10 MED ORDER — SACUBITRIL-VALSARTAN 49-51 MG PO TABS: 1.0000 | ORAL_TABLET | Freq: Two times a day (BID) | ORAL | 3 refills | Status: DC

## 2017-04-10 NOTE — Progress Notes (Signed)
Patient ID: Marvin Martin, male   DOB: 1965-01-23, 52 y.o.   MRN: [id number]    Advanced Heart Failure Clinic Note   Primary Care: Dr Laney Pastor Primary Cardiologist: Dr. Haroldine Laws  Nephrologist: Dr Joelyn Oms   HPI: Marvin Martin is a 52 year old male with history of CAD, ICM, MI X4  with 17 stents, chronic systolic HF with EF 71% s/p St. Jude ICD, HTN, gout, and hyperlipidemia.  He just relocated to [facility] from [place]. His medical care was provided by [facility]. He has struggled with in-stent restenosis. Most recent heart cath was February 2016 with stent placed.   Admitted May 8th through May 12th, 2017 with chest pain. Had LHC as noted below no intervention.   Admitted June 5th through June 14th, 0626 with A/C systolic CHF. Had repeat R/LHC as below with stable CAD and low output. Started on milrinone and weaned to 0.125 mcg/kg/min for d/c. Coreg, lasix, and spiro stopped.  Entresto dropped from full dose to 24/26 mg, subsequently stopped.   He presents today for HF follow up. Remains on milrinone 0.125. No problems with PICC. No fevers or chills. Overall doing great. No CP, dizziness, orthopnea, PND, or edema. Decreased activity, but no SOB on exertion. Plans to start walking outside with improvement of weather. Compliant with medications. Weights at home 153-154 lbs at home. Takes lasix as needed, but hasn't had to take in 3-4 months. Limits fluid and salt intake. Last creatinine 2.0.   01/2015 ECHO  EF 20-25% RV normal  03/21/15: EF 15-20% RV ok  03/2016: Echo EF 15-20%.   Corevue: Thoracic impedence generally right around threshold, trending up. No VT/VF. Activity not shown.   R/LHC 06/23/15  Dist RCA-1 lesion, 100% stenosed. The lesion was previously treated with a stent (unknown type).  Dist RCA-2 lesion, 100% stenosed. The lesion was previously treated with a stent (unknown type).  Prox RCA lesion, 70% stenosed. The lesion was previously treated with a stent (unknown type)  and angioplasty.  Mid RCA lesion, 50% stenosed. The lesion was previously treated with a stent (unknown type).  Ost RPDA to RPDA lesion, 75% stenosed. The lesion was previously treated with a drug-eluting stent.  Mid LAD lesion, 70% stenosed.  Ost 2nd Diag lesion, 70% stenosed. Ao = 87/62 (71) LV = 81/9 RA = 2 RV = 20/0/2 PA = 24/12 (16) PCW = 4 Fick cardiac output/index = 3.1/1.8 Thermo CO/CI = 3.0/1.7 PVR = 3.9 WU Ao sat = 97% PA sat = 54%, 55%  R/LHC 05/27/15  Dist RCA-1 lesion, 100% stenosed. The lesion was previously treated with a stent (unknown type).  Dist RCA-2 lesion, 100% stenosed. The lesion was previously treated with a stent (unknown type).  Prox RCA lesion, 60% stenosed. The lesion was previously treated with a stent (unknown type) and angioplasty.  Mid RCA lesion, 40% stenosed. The lesion was previously treated with a stent (unknown type). Findings: RA = 1 RV = 22/1/5 PA = 27/13 (19) PCW = 6 Ao = 120/78 (96) LV = 124/4/13 Fick cardiac output/index = 3.3/1.9 PVR = 4.0 WU SVR = 2335  FA sat = 94% PA sat = 60%. 60%  CPX 01/15/15 FVC 2.67 (65%)    FEV1 2.18 (64%)     FEV1/FVC 81 (100%)     MVV 105 (73%) Peak VO2: 20.3 (56.3% predicted peak VO2) VE/VCO2 slope: 26.8 OUES: 1.52 Peak RER: 1.21  CPX 06/2015  Peak VO2: 19.9 (55.8% predicted peak VO2) VE/VCO2 slope: 31.6 OUES:  1.42 Peak RER: 1.26  Labs: 05/08/2016: Creatinine 2.44 K 4.8   SH: Separated from his wife. Has 4 children. 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         Father deceased but he is unsure of the cause. He had hyperlipidemia.         Sister: Heart disease  [facility]  [address] [place] [address] [phone]  Past Medical History:  Diagnosis Date  . Chronic systolic CHF (congestive heart failure) ([place])    a. 01/2015 Echo: EF 20-25%, antlat, inflat AK.  [name] CKD (chronic kidney disease), stage III ([place])     . Coronary artery disease    a. s/p MI x 4;  b. Reported h/o 17 stents with recurrent ISR;  c. 02/2014 Cath ([place]): PCI to unknown vessel;  d. 01/2015 MV: EF 18%, large scar, no ischemia; e. 03/2015 PCI: LM nl, LAD nl, D2 patent stent, RI patent stent, LCX patent stents p/m, RCA 95p ISR (PTCA), 35m, 100d into RPL CTO, RPDA 90 (2.5x16 Synergy DES).  . Hyperlipidemia   . Hypertensive heart disease   . Ischemic cardiomyopathy    a. s/p SJM ICD 2007 w/ gen change in 2012; b. 12/2014 CPX Test: mild to mod HF limitation w/ pVO2 20.3 and nl slope;  c. 01/2015 Echo: EF 20-25%.   Current Outpatient Medications  Medication Sig Dispense Refill  . allopurinol (ZYLOPRIM) 100 MG tablet Take 100 mg by mouth 2 (two) times daily.     . AMBULATORY NON FORMULARY MEDICATION Inject 0.125 mcg/kg/min into the vein continuous. Medication Name: Milrinone    . amLODipine (NORVASC) 10 MG tablet TAKE 1 TABLET BY MOUTH EVERY DAY 30 tablet 3  . aspirin 81 MG tablet Take 81 mg by mouth daily.    [name] BRILINTA 90 MG TABS tablet TAKE 1 TABLET BY MOUTH TWICE A DAY 180 tablet 1  . cholecalciferol (VITAMIN D) 1000 UNITS tablet Take 1,000 Units by mouth daily.    Marvin Martin 5 MG TABS tablet TAKE 1 TABLET (5 MG TOTAL) BY MOUTH 2 (TWO) TIMES DAILY WITH A MEAL. 60 tablet 3  . ENTRESTO 24-26 MG TAKE 1 TABLET BY MOUTH TWICE A DAY 60 tablet 3  . ezetimibe (ZETIA) 10 MG tablet TAKE 1 TABLET BY MOUTH EVERY DAY 90 tablet 0  . hydrALAZINE (APRESOLINE) 50 MG tablet Take 1 tablet (50 mg total) by mouth 3 (three) times daily. 90 tablet 3  . isosorbide mononitrate (IMDUR) 30 MG 24 hr tablet Take 1 tablet (30 mg total) daily by mouth. 90 tablet 3  . Magnesium Oxide 200 MG TABS Take 1 tablet (200 mg total) by mouth 2 (two) times daily. 180 tablet 3  . nitroGLYCERIN (NITROSTAT) 0.4 MG SL tablet Place 1 tablet (0.4 mg total) under the tongue every 5 (five) minutes as needed for chest pain. Reported on 04/12/2015 60 tablet 1  . omega-3 acid ethyl esters  (LOVAZA) 1 G capsule Take 1 g by mouth daily.     . ondansetron (ZOFRAN) 4 MG tablet Take 1 tablet (4 mg total) every 8 (eight) hours as needed by mouth for nausea or vomiting. Reported on 05/24/2015 30 tablet 1  . PRALUENT 75 MG/ML SOPN INJECT 75MG  (1 PEN) SUBCUTANEOUSLY EVERY 14 DAYS 2 pen 11  . rosuvastatin (CRESTOR) 40 MG tablet TAKE 1 TABLET BY MOUTH DAILY 90 tablet 3   No current facility-administered medications for this encounter.  No Known Allergies   Social History   Socioeconomic History  . Marital status: Legally Separated    Spouse name: Not on file  . Number of children: Not on file  . Years of education: Not on file  . Highest education level: Not on file  Occupational History  . Not on file  Social Needs  . Financial resource strain: Not on file  . Food insecurity:    Worry: Not on file    Inability: Not on file  . Transportation needs:    Medical: Not on file    Non-medical: Not on file  Tobacco Use  . Smoking status: Never Smoker  . Smokeless tobacco: Never Used  Substance and Sexual Activity  . Alcohol use: No    Alcohol/week: 0.0 oz  . Drug use: No  . Sexual activity: Never  Lifestyle  . Physical activity:    Days per week: Not on file    Minutes per session: Not on file  . Stress: Not on file  Relationships  . Social connections:    Talks on phone: Not on file    Gets together: Not on file    Attends religious service: Not on file    Active member of club or organization: Not on file    Attends meetings of clubs or organizations: Not on file    Relationship status: Not on file  . Intimate partner violence:    Fear of current or ex partner: Not on file    Emotionally abused: Not on file    Physically abused: Not on file    Forced sexual activity: Not on file  Other Topics Concern  . Not on file  Social History Narrative   Moved to [place] from [place] in 2016.  Lives alone in [place].    Family History  Problem Relation Age of Onset  . Cancer Mother          died when pt was 57.  She had a h/o heart dzs as well.  . Hypertension Mother   . Hyperlipidemia Father        Father died when pt was age 75 - he believes that he had a cardiac hx.  [name] CAD Sister    Vitals:   04/10/17 0911  BP: 140/86  Pulse: (!) 59  SpO2: 98%  Weight: 159 lb 8 oz (72.3 kg)     Wt Readings from Last 3 Encounters:  04/10/17 159 lb 8 oz (72.3 kg)  02/19/17 159 lb 12.8 oz (72.5 kg)  01/22/17 159 lb 14.4 oz (72.5 kg)     PHYSICAL EXAM: General: Well appearing. No resp difficulty. HEENT: Normal Neck: Supple. No JVP. Carotids 2+ bilat; no bruits. No thyromegaly or nodule noted. Cor: PMI nondisplaced. RRR, No M/G/R noted Lungs: CTAB, normal effort. Abdomen: Soft, non-tender, non-distended, no HSM. No bruits or masses. +BS  Extremities: No cyanosis, clubbing, or rash. R and LLE no edema.  Neuro: Alert & orientedx3, cranial nerves grossly intact. moves all 4 extremities w/o difficulty. Affect pleasant  ASSESSMENT & PLAN: 1. Chronic Systolic Heart Failure - ICM.  EF 15-20% on echo 3/18. RV ok. Has ST Jude ICD - NYHA II on chronic milrinone 0.25 mcg via PICC.  - Volume status stable on exam and Corevue.  - Continue lasix as needed.  - No BB low output. - Continue corlanor 5 mg BID - Increase entresto to 49/51 mg BID. Follow renal function  - Continue hydralazine 50 mg TID. - Continue  imdur 30 mg daily.  - Not a candidate for transplant/advanced therapies due to lack of social support. Also his renal function prohibits VAD.  - He understand milrinone will not last forever.  - Reinforced fluid restriction to < 2 L daily, sodium restriction to less than 2000 mg daily, and the importance of daily weights.   2. CAD- MI X4 17 stents  Most recent [place] 06/2015 - no intervention continued medical management.    Dist RCA-1 lesion, 100% stenosed. The lesion was previously treated with a stent (unknown type).  Dist RCA-2 lesion, 100% stenosed. The lesion was previously  treated with a stent (unknown type).  Prox RCA lesion, 70% stenosed. The lesion was previously treated with a stent (unknown type) and angioplasty.  Mid RCA lesion, 50% stenosed. The lesion was previously treated with a stent (unknown type).  Ost RPDA to RPDA lesion, 75% stenosed. The lesion was previously treated with a drug-eluting stent.  Mid LAD lesion, 70% stenosed.  Ost 2nd Diag lesion, 70% stenosed. - No BB with low output on home milrinone. - No s/s ischemia.   - Continue daily crestor.  3. HTN - Elevated today, but just took medications 20 minutes ago. SBP 120's at home.  4. Hyperlipidemia - On Praulent and statin. No change.  5. CKD stage III  - Creatinine base 2.2-2.4. Continue to follow closely via [facility].  - Discussed limiting Mrs. Dash with CKD. 6. Palpitations - Resolved. If recurrent will check holter monitor.  Continue [facility] for home milrinone and labs.  Repeat Echo  Georgiana Shore, NP  9:34 AM   .Patient seen and examined with the above-signed Advanced Practice Provider and/or Housestaff. I personally reviewed laboratory data, imaging studies and relevant notes. I independently examined the patient and formulated the important aspects of the plan. I have edited the note to reflect any of my changes or salient points. I have personally discussed the plan with the patient and/or family.  Overall doing very well with chronic milrinone support. Volume status looks good. NYHA II. Will increase Entresto to 49/51 bid. Follow labs closely. Ideally would like to proceed with evaluution for heart-kidney transplant but social support network currently limited. Will continue work on this. May benefit from SW visit - will arrange. No s/s ischemia. RTC in 4 months with repeat echo.   Glori Bickers, MD  9:58 AM

## 2017-04-10 NOTE — Patient Instructions (Signed)
Increase Entresto to 49/51 mg Twice daily   We will contact you in 4 months to schedule your next appointment and echocardiogram

## 2017-04-10 NOTE — Addendum Note (Signed)
Encounter addended by: Scarlette Calico, RN on: 04/10/2017 10:11 AM  Actions taken: Pharmacy for encounter modified, Order list changed, Diagnosis association updated

## 2017-04-11 DIAGNOSIS — I130 Hypertensive heart and chronic kidney disease with heart failure and stage 1 through stage 4 chronic kidney disease, or unspecified chronic kidney disease: Secondary | ICD-10-CM | POA: Diagnosis not present

## 2017-04-11 DIAGNOSIS — I5023 Acute on chronic systolic (congestive) heart failure: Secondary | ICD-10-CM | POA: Diagnosis not present

## 2017-04-11 DIAGNOSIS — I255 Ischemic cardiomyopathy: Secondary | ICD-10-CM | POA: Diagnosis not present

## 2017-04-11 DIAGNOSIS — E785 Hyperlipidemia, unspecified: Secondary | ICD-10-CM | POA: Diagnosis not present

## 2017-04-11 DIAGNOSIS — N183 Chronic kidney disease, stage 3 (moderate): Secondary | ICD-10-CM | POA: Diagnosis not present

## 2017-04-11 DIAGNOSIS — I25110 Atherosclerotic heart disease of native coronary artery with unstable angina pectoris: Secondary | ICD-10-CM | POA: Diagnosis not present

## 2017-04-16 ENCOUNTER — Other Ambulatory Visit (HOSPITAL_COMMUNITY): Payer: Self-pay | Admitting: Internal Medicine

## 2017-04-16 ENCOUNTER — Telehealth: Payer: Self-pay | Admitting: Licensed Clinical Social Worker

## 2017-04-16 NOTE — Telephone Encounter (Signed)
CSW received referral to follow up with patient regarding supportive needs. CSW contacted patient and message left. Raquel Sarna, [place], [place]. Raquel Sarna, [place], [place]

## 2017-04-18 DIAGNOSIS — I255 Ischemic cardiomyopathy: Secondary | ICD-10-CM | POA: Diagnosis not present

## 2017-04-18 DIAGNOSIS — I5023 Acute on chronic systolic (congestive) heart failure: Secondary | ICD-10-CM | POA: Diagnosis not present

## 2017-04-18 DIAGNOSIS — E785 Hyperlipidemia, unspecified: Secondary | ICD-10-CM | POA: Diagnosis not present

## 2017-04-18 DIAGNOSIS — I130 Hypertensive heart and chronic kidney disease with heart failure and stage 1 through stage 4 chronic kidney disease, or unspecified chronic kidney disease: Secondary | ICD-10-CM | POA: Diagnosis not present

## 2017-04-18 DIAGNOSIS — I25110 Atherosclerotic heart disease of native coronary artery with unstable angina pectoris: Secondary | ICD-10-CM | POA: Diagnosis not present

## 2017-04-18 DIAGNOSIS — N183 Chronic kidney disease, stage 3 (moderate): Secondary | ICD-10-CM | POA: Diagnosis not present

## 2017-04-20 DIAGNOSIS — I255 Ischemic cardiomyopathy: Secondary | ICD-10-CM | POA: Diagnosis not present

## 2017-04-20 DIAGNOSIS — N183 Chronic kidney disease, stage 3 (moderate): Secondary | ICD-10-CM | POA: Diagnosis not present

## 2017-04-20 DIAGNOSIS — I130 Hypertensive heart and chronic kidney disease with heart failure and stage 1 through stage 4 chronic kidney disease, or unspecified chronic kidney disease: Secondary | ICD-10-CM | POA: Diagnosis not present

## 2017-04-20 DIAGNOSIS — I5023 Acute on chronic systolic (congestive) heart failure: Secondary | ICD-10-CM | POA: Diagnosis not present

## 2017-04-20 DIAGNOSIS — I25110 Atherosclerotic heart disease of native coronary artery with unstable angina pectoris: Secondary | ICD-10-CM | POA: Diagnosis not present

## 2017-04-20 DIAGNOSIS — E785 Hyperlipidemia, unspecified: Secondary | ICD-10-CM | POA: Diagnosis not present

## 2017-04-21 ENCOUNTER — Other Ambulatory Visit: Payer: Self-pay | Admitting: Family Medicine

## 2017-04-21 DIAGNOSIS — Z950 Presence of cardiac pacemaker: Secondary | ICD-10-CM | POA: Diagnosis not present

## 2017-04-21 DIAGNOSIS — I255 Ischemic cardiomyopathy: Secondary | ICD-10-CM | POA: Diagnosis not present

## 2017-04-21 DIAGNOSIS — Z5181 Encounter for therapeutic drug level monitoring: Secondary | ICD-10-CM | POA: Diagnosis not present

## 2017-04-21 DIAGNOSIS — E785 Hyperlipidemia, unspecified: Secondary | ICD-10-CM | POA: Diagnosis not present

## 2017-04-21 DIAGNOSIS — Z955 Presence of coronary angioplasty implant and graft: Secondary | ICD-10-CM | POA: Diagnosis not present

## 2017-04-21 DIAGNOSIS — Z452 Encounter for adjustment and management of vascular access device: Secondary | ICD-10-CM | POA: Diagnosis not present

## 2017-04-21 DIAGNOSIS — I25110 Atherosclerotic heart disease of native coronary artery with unstable angina pectoris: Secondary | ICD-10-CM | POA: Diagnosis not present

## 2017-04-21 DIAGNOSIS — I5023 Acute on chronic systolic (congestive) heart failure: Secondary | ICD-10-CM | POA: Diagnosis not present

## 2017-04-21 DIAGNOSIS — N183 Chronic kidney disease, stage 3 (moderate): Secondary | ICD-10-CM | POA: Diagnosis not present

## 2017-04-21 DIAGNOSIS — Z7901 Long term (current) use of anticoagulants: Secondary | ICD-10-CM | POA: Diagnosis not present

## 2017-04-21 DIAGNOSIS — I130 Hypertensive heart and chronic kidney disease with heart failure and stage 1 through stage 4 chronic kidney disease, or unspecified chronic kidney disease: Secondary | ICD-10-CM | POA: Diagnosis not present

## 2017-04-23 DIAGNOSIS — I25110 Atherosclerotic heart disease of native coronary artery with unstable angina pectoris: Secondary | ICD-10-CM | POA: Diagnosis not present

## 2017-04-23 DIAGNOSIS — I130 Hypertensive heart and chronic kidney disease with heart failure and stage 1 through stage 4 chronic kidney disease, or unspecified chronic kidney disease: Secondary | ICD-10-CM | POA: Diagnosis not present

## 2017-04-23 DIAGNOSIS — E785 Hyperlipidemia, unspecified: Secondary | ICD-10-CM | POA: Diagnosis not present

## 2017-04-23 DIAGNOSIS — I5023 Acute on chronic systolic (congestive) heart failure: Secondary | ICD-10-CM | POA: Diagnosis not present

## 2017-04-23 DIAGNOSIS — I255 Ischemic cardiomyopathy: Secondary | ICD-10-CM | POA: Diagnosis not present

## 2017-04-23 DIAGNOSIS — N183 Chronic kidney disease, stage 3 (moderate): Secondary | ICD-10-CM | POA: Diagnosis not present

## 2017-04-25 DIAGNOSIS — I5023 Acute on chronic systolic (congestive) heart failure: Secondary | ICD-10-CM | POA: Diagnosis not present

## 2017-04-25 DIAGNOSIS — N183 Chronic kidney disease, stage 3 (moderate): Secondary | ICD-10-CM | POA: Diagnosis not present

## 2017-04-25 DIAGNOSIS — I25110 Atherosclerotic heart disease of native coronary artery with unstable angina pectoris: Secondary | ICD-10-CM | POA: Diagnosis not present

## 2017-04-25 DIAGNOSIS — E785 Hyperlipidemia, unspecified: Secondary | ICD-10-CM | POA: Diagnosis not present

## 2017-04-25 DIAGNOSIS — I255 Ischemic cardiomyopathy: Secondary | ICD-10-CM | POA: Diagnosis not present

## 2017-04-25 DIAGNOSIS — I130 Hypertensive heart and chronic kidney disease with heart failure and stage 1 through stage 4 chronic kidney disease, or unspecified chronic kidney disease: Secondary | ICD-10-CM | POA: Diagnosis not present

## 2017-04-26 ENCOUNTER — Ambulatory Visit (INDEPENDENT_AMBULATORY_CARE_PROVIDER_SITE_OTHER): Payer: Medicare Other | Admitting: *Deleted

## 2017-04-26 DIAGNOSIS — I255 Ischemic cardiomyopathy: Secondary | ICD-10-CM

## 2017-04-26 NOTE — Progress Notes (Signed)
Remote ICD transmission.   

## 2017-04-27 ENCOUNTER — Other Ambulatory Visit (HOSPITAL_COMMUNITY): Payer: Self-pay | Admitting: Internal Medicine

## 2017-05-02 ENCOUNTER — Encounter: Payer: Self-pay | Admitting: Cardiology

## 2017-05-02 DIAGNOSIS — N183 Chronic kidney disease, stage 3 (moderate): Secondary | ICD-10-CM | POA: Diagnosis not present

## 2017-05-02 DIAGNOSIS — E785 Hyperlipidemia, unspecified: Secondary | ICD-10-CM | POA: Diagnosis not present

## 2017-05-02 DIAGNOSIS — I130 Hypertensive heart and chronic kidney disease with heart failure and stage 1 through stage 4 chronic kidney disease, or unspecified chronic kidney disease: Secondary | ICD-10-CM | POA: Diagnosis not present

## 2017-05-02 DIAGNOSIS — I255 Ischemic cardiomyopathy: Secondary | ICD-10-CM | POA: Diagnosis not present

## 2017-05-02 DIAGNOSIS — I5023 Acute on chronic systolic (congestive) heart failure: Secondary | ICD-10-CM | POA: Diagnosis not present

## 2017-05-02 DIAGNOSIS — I25110 Atherosclerotic heart disease of native coronary artery with unstable angina pectoris: Secondary | ICD-10-CM | POA: Diagnosis not present

## 2017-05-08 ENCOUNTER — Other Ambulatory Visit (HOSPITAL_COMMUNITY): Payer: Self-pay | Admitting: Internal Medicine

## 2017-05-09 DIAGNOSIS — I130 Hypertensive heart and chronic kidney disease with heart failure and stage 1 through stage 4 chronic kidney disease, or unspecified chronic kidney disease: Secondary | ICD-10-CM | POA: Diagnosis not present

## 2017-05-09 DIAGNOSIS — N183 Chronic kidney disease, stage 3 (moderate): Secondary | ICD-10-CM | POA: Diagnosis not present

## 2017-05-09 DIAGNOSIS — I255 Ischemic cardiomyopathy: Secondary | ICD-10-CM | POA: Diagnosis not present

## 2017-05-09 DIAGNOSIS — I5023 Acute on chronic systolic (congestive) heart failure: Secondary | ICD-10-CM | POA: Diagnosis not present

## 2017-05-09 DIAGNOSIS — I25110 Atherosclerotic heart disease of native coronary artery with unstable angina pectoris: Secondary | ICD-10-CM | POA: Diagnosis not present

## 2017-05-09 DIAGNOSIS — E785 Hyperlipidemia, unspecified: Secondary | ICD-10-CM | POA: Diagnosis not present

## 2017-05-16 DIAGNOSIS — I5023 Acute on chronic systolic (congestive) heart failure: Secondary | ICD-10-CM | POA: Diagnosis not present

## 2017-05-16 DIAGNOSIS — E785 Hyperlipidemia, unspecified: Secondary | ICD-10-CM | POA: Diagnosis not present

## 2017-05-16 DIAGNOSIS — I130 Hypertensive heart and chronic kidney disease with heart failure and stage 1 through stage 4 chronic kidney disease, or unspecified chronic kidney disease: Secondary | ICD-10-CM | POA: Diagnosis not present

## 2017-05-16 DIAGNOSIS — I255 Ischemic cardiomyopathy: Secondary | ICD-10-CM | POA: Diagnosis not present

## 2017-05-16 DIAGNOSIS — N183 Chronic kidney disease, stage 3 (moderate): Secondary | ICD-10-CM | POA: Diagnosis not present

## 2017-05-16 DIAGNOSIS — I25110 Atherosclerotic heart disease of native coronary artery with unstable angina pectoris: Secondary | ICD-10-CM | POA: Diagnosis not present

## 2017-05-17 DIAGNOSIS — I129 Hypertensive chronic kidney disease with stage 1 through stage 4 chronic kidney disease, or unspecified chronic kidney disease: Secondary | ICD-10-CM | POA: Diagnosis not present

## 2017-05-17 DIAGNOSIS — N183 Chronic kidney disease, stage 3 (moderate): Secondary | ICD-10-CM | POA: Diagnosis not present

## 2017-05-17 DIAGNOSIS — I509 Heart failure, unspecified: Secondary | ICD-10-CM | POA: Diagnosis not present

## 2017-05-17 DIAGNOSIS — N2581 Secondary hyperparathyroidism of renal origin: Secondary | ICD-10-CM | POA: Diagnosis not present

## 2017-05-23 DIAGNOSIS — N183 Chronic kidney disease, stage 3 (moderate): Secondary | ICD-10-CM | POA: Diagnosis not present

## 2017-05-23 DIAGNOSIS — I255 Ischemic cardiomyopathy: Secondary | ICD-10-CM | POA: Diagnosis not present

## 2017-05-23 DIAGNOSIS — I5023 Acute on chronic systolic (congestive) heart failure: Secondary | ICD-10-CM | POA: Diagnosis not present

## 2017-05-23 DIAGNOSIS — I130 Hypertensive heart and chronic kidney disease with heart failure and stage 1 through stage 4 chronic kidney disease, or unspecified chronic kidney disease: Secondary | ICD-10-CM | POA: Diagnosis not present

## 2017-05-23 DIAGNOSIS — E785 Hyperlipidemia, unspecified: Secondary | ICD-10-CM | POA: Diagnosis not present

## 2017-05-23 DIAGNOSIS — I25110 Atherosclerotic heart disease of native coronary artery with unstable angina pectoris: Secondary | ICD-10-CM | POA: Diagnosis not present

## 2017-05-30 DIAGNOSIS — N183 Chronic kidney disease, stage 3 (moderate): Secondary | ICD-10-CM | POA: Diagnosis not present

## 2017-05-30 DIAGNOSIS — E785 Hyperlipidemia, unspecified: Secondary | ICD-10-CM | POA: Diagnosis not present

## 2017-05-30 DIAGNOSIS — I5023 Acute on chronic systolic (congestive) heart failure: Secondary | ICD-10-CM | POA: Diagnosis not present

## 2017-05-30 DIAGNOSIS — I130 Hypertensive heart and chronic kidney disease with heart failure and stage 1 through stage 4 chronic kidney disease, or unspecified chronic kidney disease: Secondary | ICD-10-CM | POA: Diagnosis not present

## 2017-05-30 DIAGNOSIS — I25110 Atherosclerotic heart disease of native coronary artery with unstable angina pectoris: Secondary | ICD-10-CM | POA: Diagnosis not present

## 2017-05-30 DIAGNOSIS — I255 Ischemic cardiomyopathy: Secondary | ICD-10-CM | POA: Diagnosis not present

## 2017-05-31 ENCOUNTER — Other Ambulatory Visit (HOSPITAL_COMMUNITY): Payer: Self-pay | Admitting: Internal Medicine

## 2017-06-01 LAB — CUP PACEART REMOTE DEVICE CHECK
Battery Remaining Percentage: 31 %
Battery Voltage: 2.84 V
Brady Statistic AP VP Percent: 1 %
Brady Statistic RA Percent Paced: 1 %
Brady Statistic RV Percent Paced: 1 %
Date Time Interrogation Session: 20190411060017
HIGH POWER IMPEDANCE MEASURED VALUE: 84 Ohm
Implantable Lead Location: 753859
Implantable Pulse Generator Implant Date: 20121107
Lead Channel Impedance Value: 300 Ohm
Lead Channel Impedance Value: 530 Ohm
Lead Channel Pacing Threshold Amplitude: 1.25 V
Lead Channel Sensing Intrinsic Amplitude: 11.5 mV
Lead Channel Setting Pacing Amplitude: 2 V
Lead Channel Setting Pacing Pulse Width: 0.7 ms
Lead Channel Setting Sensing Sensitivity: 0.5 mV
MDC IDC LEAD IMPLANT DT: 20121107
MDC IDC LEAD IMPLANT DT: 20121107
MDC IDC LEAD LOCATION: 753860
MDC IDC MSMT BATTERY REMAINING LONGEVITY: 26 mo
MDC IDC MSMT LEADCHNL RA PACING THRESHOLD AMPLITUDE: 0.75 V
MDC IDC MSMT LEADCHNL RA PACING THRESHOLD PULSEWIDTH: 0.5 ms
MDC IDC MSMT LEADCHNL RA SENSING INTR AMPL: 4.1 mV
MDC IDC MSMT LEADCHNL RV PACING THRESHOLD PULSEWIDTH: 0.7 ms
MDC IDC SET LEADCHNL RV PACING AMPLITUDE: 2.5 V
MDC IDC STAT BRADY AP VS PERCENT: 1.7 %
MDC IDC STAT BRADY AS VP PERCENT: 1 %
MDC IDC STAT BRADY AS VS PERCENT: 95 %
Pulse Gen Serial Number: 1033795

## 2017-06-06 DIAGNOSIS — I5023 Acute on chronic systolic (congestive) heart failure: Secondary | ICD-10-CM | POA: Diagnosis not present

## 2017-06-06 DIAGNOSIS — I25110 Atherosclerotic heart disease of native coronary artery with unstable angina pectoris: Secondary | ICD-10-CM | POA: Diagnosis not present

## 2017-06-06 DIAGNOSIS — E785 Hyperlipidemia, unspecified: Secondary | ICD-10-CM | POA: Diagnosis not present

## 2017-06-06 DIAGNOSIS — I255 Ischemic cardiomyopathy: Secondary | ICD-10-CM | POA: Diagnosis not present

## 2017-06-06 DIAGNOSIS — N183 Chronic kidney disease, stage 3 (moderate): Secondary | ICD-10-CM | POA: Diagnosis not present

## 2017-06-06 DIAGNOSIS — I130 Hypertensive heart and chronic kidney disease with heart failure and stage 1 through stage 4 chronic kidney disease, or unspecified chronic kidney disease: Secondary | ICD-10-CM | POA: Diagnosis not present

## 2017-06-07 ENCOUNTER — Other Ambulatory Visit: Payer: Self-pay | Admitting: Family Medicine

## 2017-06-13 DIAGNOSIS — I130 Hypertensive heart and chronic kidney disease with heart failure and stage 1 through stage 4 chronic kidney disease, or unspecified chronic kidney disease: Secondary | ICD-10-CM | POA: Diagnosis not present

## 2017-06-13 DIAGNOSIS — I25110 Atherosclerotic heart disease of native coronary artery with unstable angina pectoris: Secondary | ICD-10-CM | POA: Diagnosis not present

## 2017-06-13 DIAGNOSIS — N183 Chronic kidney disease, stage 3 (moderate): Secondary | ICD-10-CM | POA: Diagnosis not present

## 2017-06-13 DIAGNOSIS — E785 Hyperlipidemia, unspecified: Secondary | ICD-10-CM | POA: Diagnosis not present

## 2017-06-13 DIAGNOSIS — I255 Ischemic cardiomyopathy: Secondary | ICD-10-CM | POA: Diagnosis not present

## 2017-06-13 DIAGNOSIS — I5023 Acute on chronic systolic (congestive) heart failure: Secondary | ICD-10-CM | POA: Diagnosis not present

## 2017-06-18 DIAGNOSIS — I255 Ischemic cardiomyopathy: Secondary | ICD-10-CM | POA: Diagnosis not present

## 2017-06-18 DIAGNOSIS — I25110 Atherosclerotic heart disease of native coronary artery with unstable angina pectoris: Secondary | ICD-10-CM | POA: Diagnosis not present

## 2017-06-18 DIAGNOSIS — N183 Chronic kidney disease, stage 3 (moderate): Secondary | ICD-10-CM | POA: Diagnosis not present

## 2017-06-18 DIAGNOSIS — I5023 Acute on chronic systolic (congestive) heart failure: Secondary | ICD-10-CM | POA: Diagnosis not present

## 2017-06-18 DIAGNOSIS — I130 Hypertensive heart and chronic kidney disease with heart failure and stage 1 through stage 4 chronic kidney disease, or unspecified chronic kidney disease: Secondary | ICD-10-CM | POA: Diagnosis not present

## 2017-06-18 DIAGNOSIS — E785 Hyperlipidemia, unspecified: Secondary | ICD-10-CM | POA: Diagnosis not present

## 2017-06-20 DIAGNOSIS — Z5181 Encounter for therapeutic drug level monitoring: Secondary | ICD-10-CM | POA: Diagnosis not present

## 2017-06-20 DIAGNOSIS — I25110 Atherosclerotic heart disease of native coronary artery with unstable angina pectoris: Secondary | ICD-10-CM | POA: Diagnosis not present

## 2017-06-20 DIAGNOSIS — Z950 Presence of cardiac pacemaker: Secondary | ICD-10-CM | POA: Diagnosis not present

## 2017-06-20 DIAGNOSIS — I255 Ischemic cardiomyopathy: Secondary | ICD-10-CM | POA: Diagnosis not present

## 2017-06-20 DIAGNOSIS — Z7901 Long term (current) use of anticoagulants: Secondary | ICD-10-CM | POA: Diagnosis not present

## 2017-06-20 DIAGNOSIS — E785 Hyperlipidemia, unspecified: Secondary | ICD-10-CM | POA: Diagnosis not present

## 2017-06-20 DIAGNOSIS — I5023 Acute on chronic systolic (congestive) heart failure: Secondary | ICD-10-CM | POA: Diagnosis not present

## 2017-06-20 DIAGNOSIS — Z955 Presence of coronary angioplasty implant and graft: Secondary | ICD-10-CM | POA: Diagnosis not present

## 2017-06-20 DIAGNOSIS — I130 Hypertensive heart and chronic kidney disease with heart failure and stage 1 through stage 4 chronic kidney disease, or unspecified chronic kidney disease: Secondary | ICD-10-CM | POA: Diagnosis not present

## 2017-06-20 DIAGNOSIS — N183 Chronic kidney disease, stage 3 (moderate): Secondary | ICD-10-CM | POA: Diagnosis not present

## 2017-06-20 DIAGNOSIS — Z452 Encounter for adjustment and management of vascular access device: Secondary | ICD-10-CM | POA: Diagnosis not present

## 2017-06-27 ENCOUNTER — Other Ambulatory Visit (HOSPITAL_COMMUNITY): Payer: Self-pay | Admitting: Internal Medicine

## 2017-06-27 DIAGNOSIS — N183 Chronic kidney disease, stage 3 (moderate): Secondary | ICD-10-CM | POA: Diagnosis not present

## 2017-06-27 DIAGNOSIS — E785 Hyperlipidemia, unspecified: Secondary | ICD-10-CM | POA: Diagnosis not present

## 2017-06-27 DIAGNOSIS — I5023 Acute on chronic systolic (congestive) heart failure: Secondary | ICD-10-CM | POA: Diagnosis not present

## 2017-06-27 DIAGNOSIS — I25110 Atherosclerotic heart disease of native coronary artery with unstable angina pectoris: Secondary | ICD-10-CM | POA: Diagnosis not present

## 2017-06-27 DIAGNOSIS — I130 Hypertensive heart and chronic kidney disease with heart failure and stage 1 through stage 4 chronic kidney disease, or unspecified chronic kidney disease: Secondary | ICD-10-CM | POA: Diagnosis not present

## 2017-06-27 DIAGNOSIS — I255 Ischemic cardiomyopathy: Secondary | ICD-10-CM | POA: Diagnosis not present

## 2017-07-03 ENCOUNTER — Other Ambulatory Visit (HOSPITAL_COMMUNITY): Payer: Self-pay | Admitting: Internal Medicine

## 2017-07-04 DIAGNOSIS — E785 Hyperlipidemia, unspecified: Secondary | ICD-10-CM | POA: Diagnosis not present

## 2017-07-04 DIAGNOSIS — I255 Ischemic cardiomyopathy: Secondary | ICD-10-CM | POA: Diagnosis not present

## 2017-07-04 DIAGNOSIS — N183 Chronic kidney disease, stage 3 (moderate): Secondary | ICD-10-CM | POA: Diagnosis not present

## 2017-07-04 DIAGNOSIS — I5023 Acute on chronic systolic (congestive) heart failure: Secondary | ICD-10-CM | POA: Diagnosis not present

## 2017-07-04 DIAGNOSIS — I130 Hypertensive heart and chronic kidney disease with heart failure and stage 1 through stage 4 chronic kidney disease, or unspecified chronic kidney disease: Secondary | ICD-10-CM | POA: Diagnosis not present

## 2017-07-04 DIAGNOSIS — I25110 Atherosclerotic heart disease of native coronary artery with unstable angina pectoris: Secondary | ICD-10-CM | POA: Diagnosis not present

## 2017-07-06 ENCOUNTER — Other Ambulatory Visit: Payer: Self-pay | Admitting: Family Medicine

## 2017-07-07 IMAGING — DX DG CHEST 1V PORT
2 series · 2 of 2 positions shown · non-contrast
Comparison: No priors.

CLINICAL DATA: 50-year-old male with mid chest pain beginning
earlier today progressively worsening. History of coronary artery
disease status post multiple stent placement and multiple myocardial
infarctions. Congestive heart failure.

EXAM:
PORTABLE CHEST 1 VIEW

[chest ap (1 of 2)]
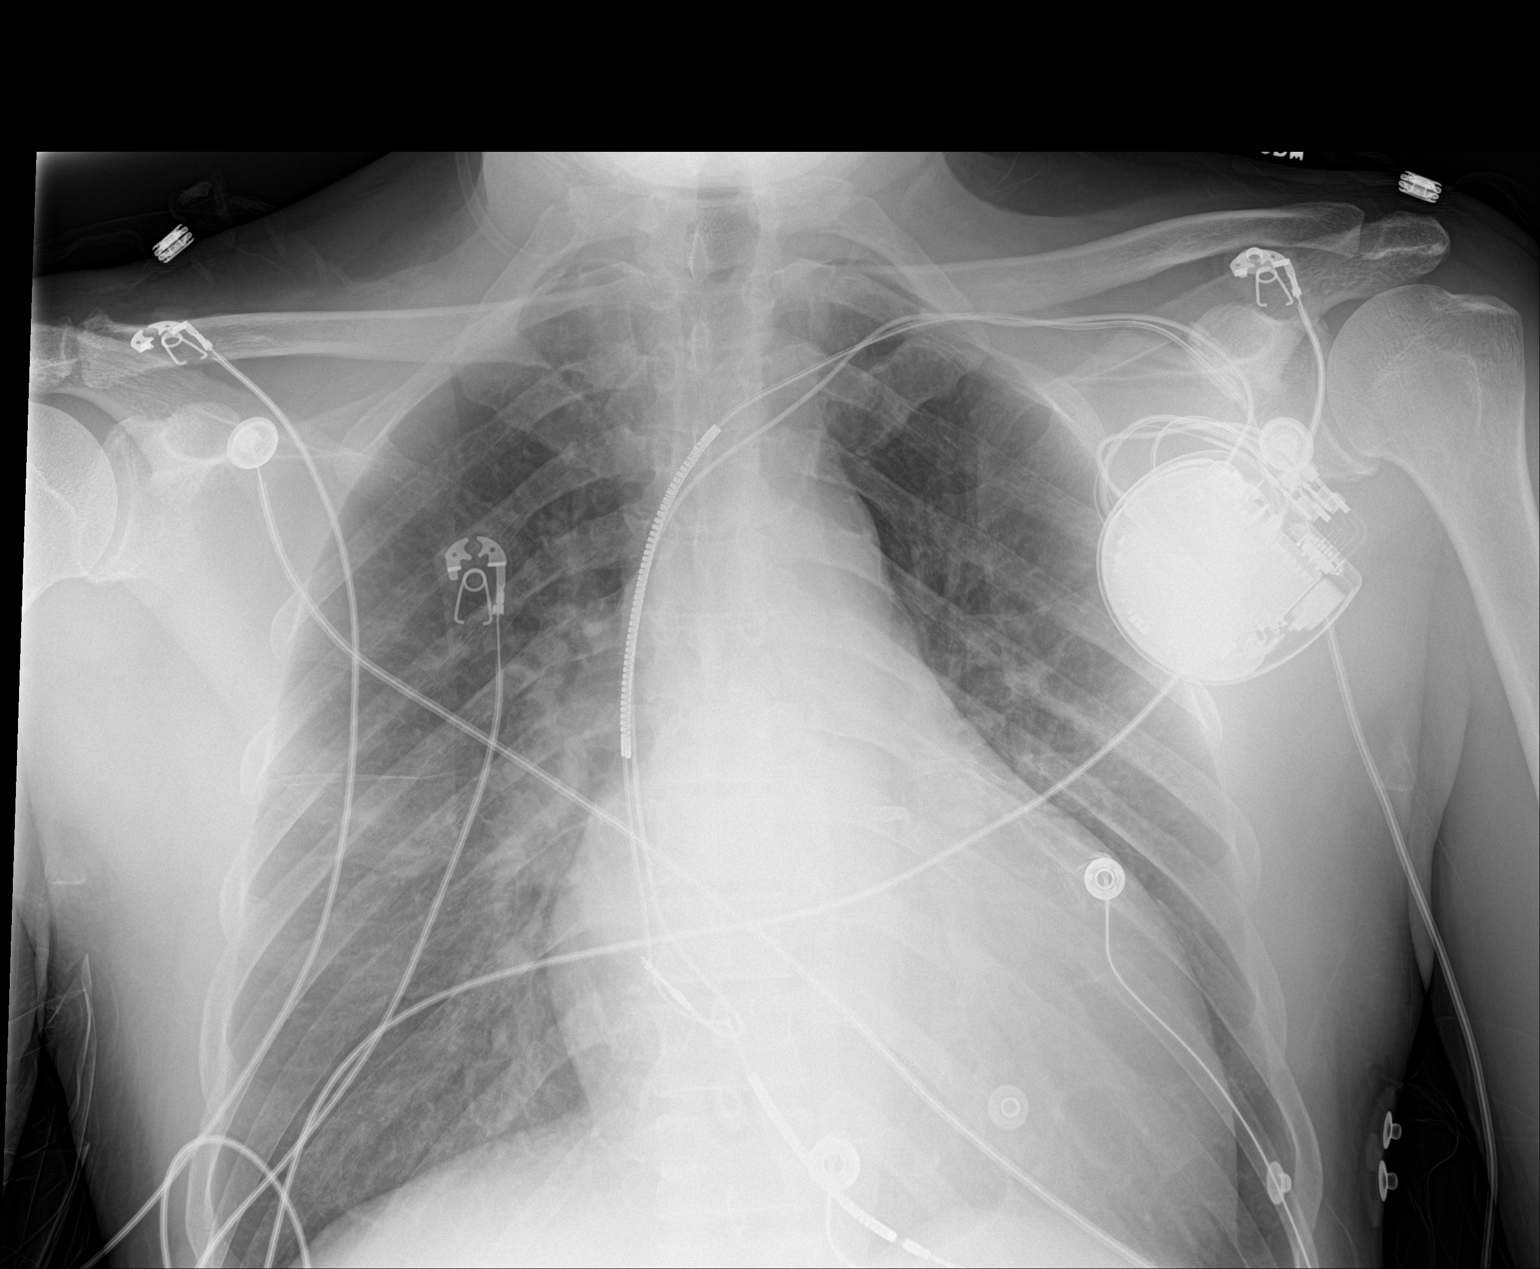

[chest ap (2 of 2)]
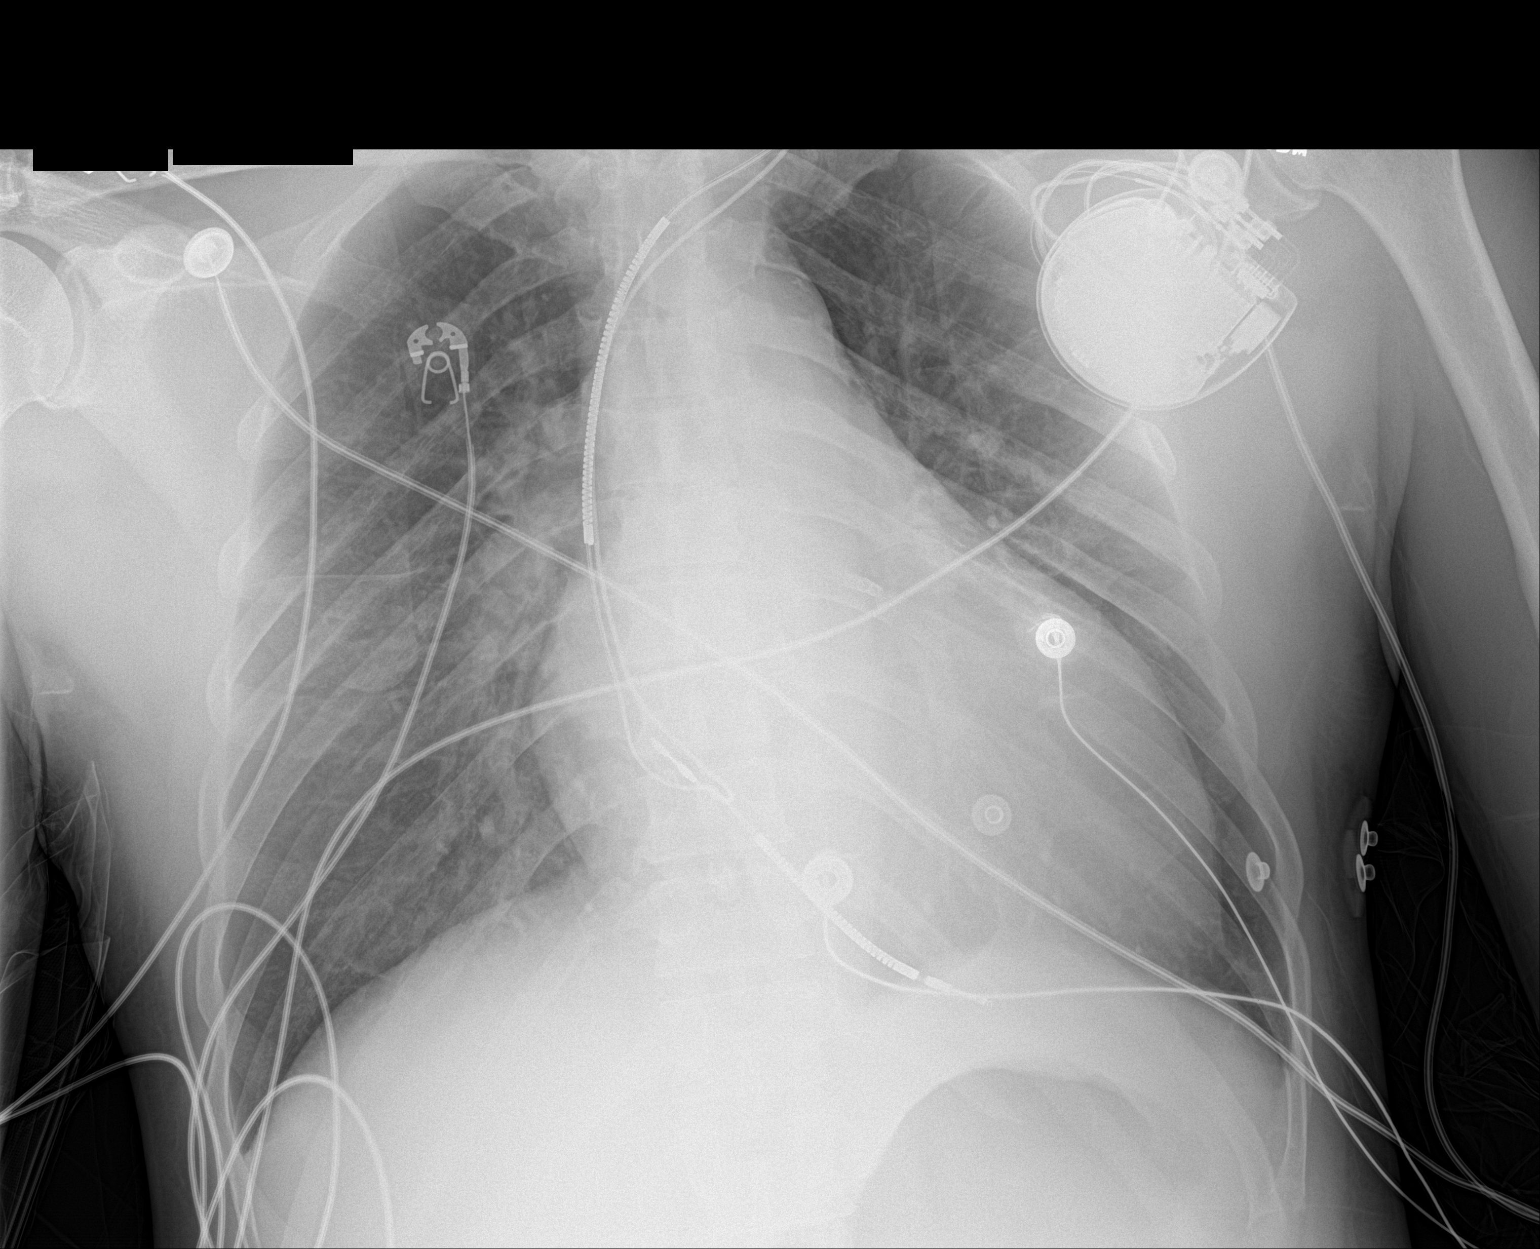

[2 of 2 positions shown; findings below may reference images not displayed]

FINDINGS: Lung volumes are normal. No consolidative airspace disease. No
pleural effusions. No pneumothorax. No suspicious appearing
pulmonary nodules or masses. No evidence of pulmonary edema. Heart
size is mildly enlarged. Upper mediastinal contours are within
normal limits. Atherosclerosis in the thoracic aorta. Coronary
artery stents are noted. Left sided pacemaker/AICD with lead tips
projecting over the expected location of the right atrium and right
ventricular apex.
IMPRESSION: 1. No radiographic evidence of acute cardiopulmonary disease.
2. Mild cardiomegaly.
3. Atherosclerosis.

## 2017-07-11 DIAGNOSIS — N183 Chronic kidney disease, stage 3 (moderate): Secondary | ICD-10-CM | POA: Diagnosis not present

## 2017-07-11 DIAGNOSIS — I5023 Acute on chronic systolic (congestive) heart failure: Secondary | ICD-10-CM | POA: Diagnosis not present

## 2017-07-11 DIAGNOSIS — E785 Hyperlipidemia, unspecified: Secondary | ICD-10-CM | POA: Diagnosis not present

## 2017-07-11 DIAGNOSIS — I255 Ischemic cardiomyopathy: Secondary | ICD-10-CM | POA: Diagnosis not present

## 2017-07-11 DIAGNOSIS — I25110 Atherosclerotic heart disease of native coronary artery with unstable angina pectoris: Secondary | ICD-10-CM | POA: Diagnosis not present

## 2017-07-11 DIAGNOSIS — I130 Hypertensive heart and chronic kidney disease with heart failure and stage 1 through stage 4 chronic kidney disease, or unspecified chronic kidney disease: Secondary | ICD-10-CM | POA: Diagnosis not present

## 2017-07-15 ENCOUNTER — Other Ambulatory Visit: Payer: Self-pay | Admitting: Family Medicine

## 2017-07-16 ENCOUNTER — Other Ambulatory Visit (HOSPITAL_COMMUNITY): Payer: Self-pay | Admitting: Internal Medicine

## 2017-07-18 DIAGNOSIS — I25110 Atherosclerotic heart disease of native coronary artery with unstable angina pectoris: Secondary | ICD-10-CM | POA: Diagnosis not present

## 2017-07-18 DIAGNOSIS — I130 Hypertensive heart and chronic kidney disease with heart failure and stage 1 through stage 4 chronic kidney disease, or unspecified chronic kidney disease: Secondary | ICD-10-CM | POA: Diagnosis not present

## 2017-07-18 DIAGNOSIS — I255 Ischemic cardiomyopathy: Secondary | ICD-10-CM | POA: Diagnosis not present

## 2017-07-18 DIAGNOSIS — I5023 Acute on chronic systolic (congestive) heart failure: Secondary | ICD-10-CM | POA: Diagnosis not present

## 2017-07-18 DIAGNOSIS — N183 Chronic kidney disease, stage 3 (moderate): Secondary | ICD-10-CM | POA: Diagnosis not present

## 2017-07-18 DIAGNOSIS — E785 Hyperlipidemia, unspecified: Secondary | ICD-10-CM | POA: Diagnosis not present

## 2017-07-25 DIAGNOSIS — I25110 Atherosclerotic heart disease of native coronary artery with unstable angina pectoris: Secondary | ICD-10-CM | POA: Diagnosis not present

## 2017-07-25 DIAGNOSIS — I130 Hypertensive heart and chronic kidney disease with heart failure and stage 1 through stage 4 chronic kidney disease, or unspecified chronic kidney disease: Secondary | ICD-10-CM | POA: Diagnosis not present

## 2017-07-25 DIAGNOSIS — N183 Chronic kidney disease, stage 3 (moderate): Secondary | ICD-10-CM | POA: Diagnosis not present

## 2017-07-25 DIAGNOSIS — I255 Ischemic cardiomyopathy: Secondary | ICD-10-CM | POA: Diagnosis not present

## 2017-07-25 DIAGNOSIS — E785 Hyperlipidemia, unspecified: Secondary | ICD-10-CM | POA: Diagnosis not present

## 2017-07-25 DIAGNOSIS — I5023 Acute on chronic systolic (congestive) heart failure: Secondary | ICD-10-CM | POA: Diagnosis not present

## 2017-07-26 ENCOUNTER — Ambulatory Visit (INDEPENDENT_AMBULATORY_CARE_PROVIDER_SITE_OTHER): Payer: Medicare Other | Admitting: *Deleted

## 2017-07-26 DIAGNOSIS — I255 Ischemic cardiomyopathy: Secondary | ICD-10-CM

## 2017-07-26 DIAGNOSIS — I5022 Chronic systolic (congestive) heart failure: Secondary | ICD-10-CM

## 2017-07-26 NOTE — Progress Notes (Signed)
Remote ICD transmission.   

## 2017-08-01 DIAGNOSIS — I25110 Atherosclerotic heart disease of native coronary artery with unstable angina pectoris: Secondary | ICD-10-CM | POA: Diagnosis not present

## 2017-08-01 DIAGNOSIS — I130 Hypertensive heart and chronic kidney disease with heart failure and stage 1 through stage 4 chronic kidney disease, or unspecified chronic kidney disease: Secondary | ICD-10-CM | POA: Diagnosis not present

## 2017-08-01 DIAGNOSIS — E785 Hyperlipidemia, unspecified: Secondary | ICD-10-CM | POA: Diagnosis not present

## 2017-08-01 DIAGNOSIS — I5023 Acute on chronic systolic (congestive) heart failure: Secondary | ICD-10-CM | POA: Diagnosis not present

## 2017-08-01 DIAGNOSIS — N183 Chronic kidney disease, stage 3 (moderate): Secondary | ICD-10-CM | POA: Diagnosis not present

## 2017-08-01 DIAGNOSIS — I255 Ischemic cardiomyopathy: Secondary | ICD-10-CM | POA: Diagnosis not present

## 2017-08-03 ENCOUNTER — Other Ambulatory Visit (HOSPITAL_COMMUNITY): Payer: Self-pay | Admitting: Internal Medicine

## 2017-08-05 ENCOUNTER — Other Ambulatory Visit (HOSPITAL_COMMUNITY): Payer: Self-pay | Admitting: Internal Medicine

## 2017-08-08 DIAGNOSIS — I5023 Acute on chronic systolic (congestive) heart failure: Secondary | ICD-10-CM | POA: Diagnosis not present

## 2017-08-08 DIAGNOSIS — I255 Ischemic cardiomyopathy: Secondary | ICD-10-CM | POA: Diagnosis not present

## 2017-08-08 DIAGNOSIS — E785 Hyperlipidemia, unspecified: Secondary | ICD-10-CM | POA: Diagnosis not present

## 2017-08-08 DIAGNOSIS — I25110 Atherosclerotic heart disease of native coronary artery with unstable angina pectoris: Secondary | ICD-10-CM | POA: Diagnosis not present

## 2017-08-08 DIAGNOSIS — N183 Chronic kidney disease, stage 3 (moderate): Secondary | ICD-10-CM | POA: Diagnosis not present

## 2017-08-08 DIAGNOSIS — I130 Hypertensive heart and chronic kidney disease with heart failure and stage 1 through stage 4 chronic kidney disease, or unspecified chronic kidney disease: Secondary | ICD-10-CM | POA: Diagnosis not present

## 2017-08-15 DIAGNOSIS — I25110 Atherosclerotic heart disease of native coronary artery with unstable angina pectoris: Secondary | ICD-10-CM | POA: Diagnosis not present

## 2017-08-15 DIAGNOSIS — I255 Ischemic cardiomyopathy: Secondary | ICD-10-CM | POA: Diagnosis not present

## 2017-08-15 DIAGNOSIS — I5023 Acute on chronic systolic (congestive) heart failure: Secondary | ICD-10-CM | POA: Diagnosis not present

## 2017-08-15 DIAGNOSIS — I130 Hypertensive heart and chronic kidney disease with heart failure and stage 1 through stage 4 chronic kidney disease, or unspecified chronic kidney disease: Secondary | ICD-10-CM | POA: Diagnosis not present

## 2017-08-15 DIAGNOSIS — N183 Chronic kidney disease, stage 3 (moderate): Secondary | ICD-10-CM | POA: Diagnosis not present

## 2017-08-15 DIAGNOSIS — E785 Hyperlipidemia, unspecified: Secondary | ICD-10-CM | POA: Diagnosis not present

## 2017-08-19 DIAGNOSIS — I25110 Atherosclerotic heart disease of native coronary artery with unstable angina pectoris: Secondary | ICD-10-CM | POA: Diagnosis not present

## 2017-08-19 DIAGNOSIS — E785 Hyperlipidemia, unspecified: Secondary | ICD-10-CM | POA: Diagnosis not present

## 2017-08-19 DIAGNOSIS — Z955 Presence of coronary angioplasty implant and graft: Secondary | ICD-10-CM | POA: Diagnosis not present

## 2017-08-19 DIAGNOSIS — Z452 Encounter for adjustment and management of vascular access device: Secondary | ICD-10-CM | POA: Diagnosis not present

## 2017-08-19 DIAGNOSIS — Z5181 Encounter for therapeutic drug level monitoring: Secondary | ICD-10-CM | POA: Diagnosis not present

## 2017-08-19 DIAGNOSIS — Z7901 Long term (current) use of anticoagulants: Secondary | ICD-10-CM | POA: Diagnosis not present

## 2017-08-19 DIAGNOSIS — Z950 Presence of cardiac pacemaker: Secondary | ICD-10-CM | POA: Diagnosis not present

## 2017-08-19 DIAGNOSIS — I255 Ischemic cardiomyopathy: Secondary | ICD-10-CM | POA: Diagnosis not present

## 2017-08-19 DIAGNOSIS — I5023 Acute on chronic systolic (congestive) heart failure: Secondary | ICD-10-CM | POA: Diagnosis not present

## 2017-08-19 DIAGNOSIS — I130 Hypertensive heart and chronic kidney disease with heart failure and stage 1 through stage 4 chronic kidney disease, or unspecified chronic kidney disease: Secondary | ICD-10-CM | POA: Diagnosis not present

## 2017-08-19 DIAGNOSIS — N183 Chronic kidney disease, stage 3 (moderate): Secondary | ICD-10-CM | POA: Diagnosis not present

## 2017-08-20 ENCOUNTER — Other Ambulatory Visit (HOSPITAL_COMMUNITY): Payer: Self-pay | Admitting: Internal Medicine

## 2017-08-22 DIAGNOSIS — I130 Hypertensive heart and chronic kidney disease with heart failure and stage 1 through stage 4 chronic kidney disease, or unspecified chronic kidney disease: Secondary | ICD-10-CM | POA: Diagnosis not present

## 2017-08-22 DIAGNOSIS — I255 Ischemic cardiomyopathy: Secondary | ICD-10-CM | POA: Diagnosis not present

## 2017-08-22 DIAGNOSIS — I5023 Acute on chronic systolic (congestive) heart failure: Secondary | ICD-10-CM | POA: Diagnosis not present

## 2017-08-22 DIAGNOSIS — E785 Hyperlipidemia, unspecified: Secondary | ICD-10-CM | POA: Diagnosis not present

## 2017-08-22 DIAGNOSIS — I25110 Atherosclerotic heart disease of native coronary artery with unstable angina pectoris: Secondary | ICD-10-CM | POA: Diagnosis not present

## 2017-08-22 DIAGNOSIS — N183 Chronic kidney disease, stage 3 (moderate): Secondary | ICD-10-CM | POA: Diagnosis not present

## 2017-08-27 LAB — CUP PACEART REMOTE DEVICE CHECK
Battery Remaining Percentage: 27 %
Brady Statistic RA Percent Paced: 1 %
Brady Statistic RV Percent Paced: 1 %
HighPow Impedance: 92 Ohm
Implantable Lead Implant Date: 20121107
Implantable Lead Location: 753859
Implantable Lead Location: 753860
Implantable Lead Model: 7000
Implantable Pulse Generator Implant Date: 20121107
Lead Channel Impedance Value: 550 Ohm
Lead Channel Pacing Threshold Amplitude: 1.25 V
Lead Channel Pacing Threshold Pulse Width: 0.5 ms
Lead Channel Sensing Intrinsic Amplitude: 11.5 mV
Lead Channel Setting Pacing Amplitude: 2 V
Lead Channel Setting Pacing Pulse Width: 0.7 ms
MDC IDC LEAD IMPLANT DT: 20121107
MDC IDC MSMT BATTERY REMAINING LONGEVITY: 23 mo
MDC IDC MSMT BATTERY VOLTAGE: 2.81 V
MDC IDC MSMT LEADCHNL RA IMPEDANCE VALUE: 300 Ohm
MDC IDC MSMT LEADCHNL RA PACING THRESHOLD AMPLITUDE: 0.75 V
MDC IDC MSMT LEADCHNL RA SENSING INTR AMPL: 3 mV
MDC IDC MSMT LEADCHNL RV PACING THRESHOLD PULSEWIDTH: 0.7 ms
MDC IDC SESS DTM: 20190711060016
MDC IDC SET LEADCHNL RV PACING AMPLITUDE: 2.5 V
MDC IDC SET LEADCHNL RV SENSING SENSITIVITY: 0.5 mV
MDC IDC STAT BRADY AP VP PERCENT: 1 %
MDC IDC STAT BRADY AP VS PERCENT: 1.6 %
MDC IDC STAT BRADY AS VP PERCENT: 1 %
MDC IDC STAT BRADY AS VS PERCENT: 95 %
Pulse Gen Serial Number: 1033795

## 2017-08-29 DIAGNOSIS — N183 Chronic kidney disease, stage 3 (moderate): Secondary | ICD-10-CM | POA: Diagnosis not present

## 2017-08-29 DIAGNOSIS — E785 Hyperlipidemia, unspecified: Secondary | ICD-10-CM | POA: Diagnosis not present

## 2017-08-29 DIAGNOSIS — I255 Ischemic cardiomyopathy: Secondary | ICD-10-CM | POA: Diagnosis not present

## 2017-08-29 DIAGNOSIS — I130 Hypertensive heart and chronic kidney disease with heart failure and stage 1 through stage 4 chronic kidney disease, or unspecified chronic kidney disease: Secondary | ICD-10-CM | POA: Diagnosis not present

## 2017-08-29 DIAGNOSIS — I5023 Acute on chronic systolic (congestive) heart failure: Secondary | ICD-10-CM | POA: Diagnosis not present

## 2017-08-29 DIAGNOSIS — I25110 Atherosclerotic heart disease of native coronary artery with unstable angina pectoris: Secondary | ICD-10-CM | POA: Diagnosis not present

## 2017-08-31 ENCOUNTER — Other Ambulatory Visit (HOSPITAL_COMMUNITY): Payer: Self-pay | Admitting: Internal Medicine

## 2017-09-05 DIAGNOSIS — E785 Hyperlipidemia, unspecified: Secondary | ICD-10-CM | POA: Diagnosis not present

## 2017-09-05 DIAGNOSIS — N183 Chronic kidney disease, stage 3 (moderate): Secondary | ICD-10-CM | POA: Diagnosis not present

## 2017-09-05 DIAGNOSIS — I255 Ischemic cardiomyopathy: Secondary | ICD-10-CM | POA: Diagnosis not present

## 2017-09-05 DIAGNOSIS — I5023 Acute on chronic systolic (congestive) heart failure: Secondary | ICD-10-CM | POA: Diagnosis not present

## 2017-09-05 DIAGNOSIS — I130 Hypertensive heart and chronic kidney disease with heart failure and stage 1 through stage 4 chronic kidney disease, or unspecified chronic kidney disease: Secondary | ICD-10-CM | POA: Diagnosis not present

## 2017-09-05 DIAGNOSIS — I25110 Atherosclerotic heart disease of native coronary artery with unstable angina pectoris: Secondary | ICD-10-CM | POA: Diagnosis not present

## 2017-09-12 DIAGNOSIS — I255 Ischemic cardiomyopathy: Secondary | ICD-10-CM | POA: Diagnosis not present

## 2017-09-12 DIAGNOSIS — I5023 Acute on chronic systolic (congestive) heart failure: Secondary | ICD-10-CM | POA: Diagnosis not present

## 2017-09-12 DIAGNOSIS — E785 Hyperlipidemia, unspecified: Secondary | ICD-10-CM | POA: Diagnosis not present

## 2017-09-12 DIAGNOSIS — N183 Chronic kidney disease, stage 3 (moderate): Secondary | ICD-10-CM | POA: Diagnosis not present

## 2017-09-12 DIAGNOSIS — I25110 Atherosclerotic heart disease of native coronary artery with unstable angina pectoris: Secondary | ICD-10-CM | POA: Diagnosis not present

## 2017-09-12 DIAGNOSIS — I130 Hypertensive heart and chronic kidney disease with heart failure and stage 1 through stage 4 chronic kidney disease, or unspecified chronic kidney disease: Secondary | ICD-10-CM | POA: Diagnosis not present

## 2017-09-19 ENCOUNTER — Other Ambulatory Visit (HOSPITAL_COMMUNITY): Payer: Self-pay | Admitting: Internal Medicine

## 2017-09-19 DIAGNOSIS — I25110 Atherosclerotic heart disease of native coronary artery with unstable angina pectoris: Secondary | ICD-10-CM | POA: Diagnosis not present

## 2017-09-19 DIAGNOSIS — N183 Chronic kidney disease, stage 3 (moderate): Secondary | ICD-10-CM | POA: Diagnosis not present

## 2017-09-19 DIAGNOSIS — I130 Hypertensive heart and chronic kidney disease with heart failure and stage 1 through stage 4 chronic kidney disease, or unspecified chronic kidney disease: Secondary | ICD-10-CM | POA: Diagnosis not present

## 2017-09-19 DIAGNOSIS — I5023 Acute on chronic systolic (congestive) heart failure: Secondary | ICD-10-CM | POA: Diagnosis not present

## 2017-09-19 DIAGNOSIS — E785 Hyperlipidemia, unspecified: Secondary | ICD-10-CM | POA: Diagnosis not present

## 2017-09-19 DIAGNOSIS — I255 Ischemic cardiomyopathy: Secondary | ICD-10-CM | POA: Diagnosis not present

## 2017-09-20 DIAGNOSIS — I129 Hypertensive chronic kidney disease with stage 1 through stage 4 chronic kidney disease, or unspecified chronic kidney disease: Secondary | ICD-10-CM | POA: Diagnosis not present

## 2017-09-20 DIAGNOSIS — N183 Chronic kidney disease, stage 3 (moderate): Secondary | ICD-10-CM | POA: Diagnosis not present

## 2017-09-20 DIAGNOSIS — I509 Heart failure, unspecified: Secondary | ICD-10-CM | POA: Diagnosis not present

## 2017-09-20 DIAGNOSIS — N2581 Secondary hyperparathyroidism of renal origin: Secondary | ICD-10-CM | POA: Diagnosis not present

## 2017-09-26 ENCOUNTER — Other Ambulatory Visit (HOSPITAL_COMMUNITY): Payer: Self-pay | Admitting: Internal Medicine

## 2017-09-26 DIAGNOSIS — I25110 Atherosclerotic heart disease of native coronary artery with unstable angina pectoris: Secondary | ICD-10-CM | POA: Diagnosis not present

## 2017-09-26 DIAGNOSIS — N183 Chronic kidney disease, stage 3 (moderate): Secondary | ICD-10-CM | POA: Diagnosis not present

## 2017-09-26 DIAGNOSIS — E785 Hyperlipidemia, unspecified: Secondary | ICD-10-CM | POA: Diagnosis not present

## 2017-09-26 DIAGNOSIS — I130 Hypertensive heart and chronic kidney disease with heart failure and stage 1 through stage 4 chronic kidney disease, or unspecified chronic kidney disease: Secondary | ICD-10-CM | POA: Diagnosis not present

## 2017-09-26 DIAGNOSIS — I5023 Acute on chronic systolic (congestive) heart failure: Secondary | ICD-10-CM | POA: Diagnosis not present

## 2017-09-26 DIAGNOSIS — I255 Ischemic cardiomyopathy: Secondary | ICD-10-CM | POA: Diagnosis not present

## 2017-09-30 ENCOUNTER — Other Ambulatory Visit (HOSPITAL_COMMUNITY): Payer: Self-pay | Admitting: Internal Medicine

## 2017-09-30 ENCOUNTER — Other Ambulatory Visit: Payer: Self-pay | Admitting: Family Medicine

## 2017-10-03 DIAGNOSIS — E785 Hyperlipidemia, unspecified: Secondary | ICD-10-CM | POA: Diagnosis not present

## 2017-10-03 DIAGNOSIS — I130 Hypertensive heart and chronic kidney disease with heart failure and stage 1 through stage 4 chronic kidney disease, or unspecified chronic kidney disease: Secondary | ICD-10-CM | POA: Diagnosis not present

## 2017-10-03 DIAGNOSIS — I129 Hypertensive chronic kidney disease with stage 1 through stage 4 chronic kidney disease, or unspecified chronic kidney disease: Secondary | ICD-10-CM | POA: Diagnosis not present

## 2017-10-03 DIAGNOSIS — I25110 Atherosclerotic heart disease of native coronary artery with unstable angina pectoris: Secondary | ICD-10-CM | POA: Diagnosis not present

## 2017-10-03 DIAGNOSIS — N2581 Secondary hyperparathyroidism of renal origin: Secondary | ICD-10-CM | POA: Diagnosis not present

## 2017-10-03 DIAGNOSIS — I5023 Acute on chronic systolic (congestive) heart failure: Secondary | ICD-10-CM | POA: Diagnosis not present

## 2017-10-03 DIAGNOSIS — I255 Ischemic cardiomyopathy: Secondary | ICD-10-CM | POA: Diagnosis not present

## 2017-10-03 DIAGNOSIS — I509 Heart failure, unspecified: Secondary | ICD-10-CM | POA: Diagnosis not present

## 2017-10-03 DIAGNOSIS — N183 Chronic kidney disease, stage 3 (moderate): Secondary | ICD-10-CM | POA: Diagnosis not present

## 2017-10-09 ENCOUNTER — Other Ambulatory Visit (HOSPITAL_COMMUNITY): Payer: Self-pay | Admitting: Internal Medicine

## 2017-10-10 DIAGNOSIS — I255 Ischemic cardiomyopathy: Secondary | ICD-10-CM | POA: Diagnosis not present

## 2017-10-10 DIAGNOSIS — E785 Hyperlipidemia, unspecified: Secondary | ICD-10-CM | POA: Diagnosis not present

## 2017-10-10 DIAGNOSIS — I25110 Atherosclerotic heart disease of native coronary artery with unstable angina pectoris: Secondary | ICD-10-CM | POA: Diagnosis not present

## 2017-10-10 DIAGNOSIS — I130 Hypertensive heart and chronic kidney disease with heart failure and stage 1 through stage 4 chronic kidney disease, or unspecified chronic kidney disease: Secondary | ICD-10-CM | POA: Diagnosis not present

## 2017-10-10 DIAGNOSIS — I5023 Acute on chronic systolic (congestive) heart failure: Secondary | ICD-10-CM | POA: Diagnosis not present

## 2017-10-10 DIAGNOSIS — N183 Chronic kidney disease, stage 3 (moderate): Secondary | ICD-10-CM | POA: Diagnosis not present

## 2017-10-17 DIAGNOSIS — I255 Ischemic cardiomyopathy: Secondary | ICD-10-CM | POA: Diagnosis not present

## 2017-10-17 DIAGNOSIS — I130 Hypertensive heart and chronic kidney disease with heart failure and stage 1 through stage 4 chronic kidney disease, or unspecified chronic kidney disease: Secondary | ICD-10-CM | POA: Diagnosis not present

## 2017-10-17 DIAGNOSIS — I5023 Acute on chronic systolic (congestive) heart failure: Secondary | ICD-10-CM | POA: Diagnosis not present

## 2017-10-17 DIAGNOSIS — E785 Hyperlipidemia, unspecified: Secondary | ICD-10-CM | POA: Diagnosis not present

## 2017-10-17 DIAGNOSIS — I25110 Atherosclerotic heart disease of native coronary artery with unstable angina pectoris: Secondary | ICD-10-CM | POA: Diagnosis not present

## 2017-10-17 DIAGNOSIS — N183 Chronic kidney disease, stage 3 (moderate): Secondary | ICD-10-CM | POA: Diagnosis not present

## 2017-10-22 ENCOUNTER — Other Ambulatory Visit (HOSPITAL_COMMUNITY): Payer: Self-pay | Admitting: Internal Medicine

## 2017-10-23 ENCOUNTER — Ambulatory Visit (INDEPENDENT_AMBULATORY_CARE_PROVIDER_SITE_OTHER): Payer: Medicare Other | Admitting: *Deleted

## 2017-10-23 DIAGNOSIS — Z79899 Other long term (current) drug therapy: Secondary | ICD-10-CM | POA: Diagnosis not present

## 2017-10-23 DIAGNOSIS — Z950 Presence of cardiac pacemaker: Secondary | ICD-10-CM | POA: Diagnosis not present

## 2017-10-23 DIAGNOSIS — N183 Chronic kidney disease, stage 3 (moderate): Secondary | ICD-10-CM | POA: Diagnosis not present

## 2017-10-23 DIAGNOSIS — Z5181 Encounter for therapeutic drug level monitoring: Secondary | ICD-10-CM | POA: Diagnosis not present

## 2017-10-23 DIAGNOSIS — I5022 Chronic systolic (congestive) heart failure: Secondary | ICD-10-CM | POA: Diagnosis not present

## 2017-10-23 DIAGNOSIS — Z7982 Long term (current) use of aspirin: Secondary | ICD-10-CM | POA: Diagnosis not present

## 2017-10-23 DIAGNOSIS — I255 Ischemic cardiomyopathy: Secondary | ICD-10-CM

## 2017-10-23 DIAGNOSIS — Z452 Encounter for adjustment and management of vascular access device: Secondary | ICD-10-CM | POA: Diagnosis not present

## 2017-10-23 DIAGNOSIS — I25110 Atherosclerotic heart disease of native coronary artery with unstable angina pectoris: Secondary | ICD-10-CM | POA: Diagnosis not present

## 2017-10-23 DIAGNOSIS — Z955 Presence of coronary angioplasty implant and graft: Secondary | ICD-10-CM | POA: Diagnosis not present

## 2017-10-23 DIAGNOSIS — E785 Hyperlipidemia, unspecified: Secondary | ICD-10-CM | POA: Diagnosis not present

## 2017-10-23 DIAGNOSIS — I130 Hypertensive heart and chronic kidney disease with heart failure and stage 1 through stage 4 chronic kidney disease, or unspecified chronic kidney disease: Secondary | ICD-10-CM | POA: Diagnosis not present

## 2017-10-23 DIAGNOSIS — Z7901 Long term (current) use of anticoagulants: Secondary | ICD-10-CM | POA: Diagnosis not present

## 2017-10-23 LAB — CUP PACEART INCLINIC DEVICE CHECK
Battery Remaining Longevity: 24 mo
Brady Statistic RA Percent Paced: 0.01 %
Date Time Interrogation Session: 20191008101140
HIGH POWER IMPEDANCE MEASURED VALUE: 103.2 Ohm
Implantable Lead Implant Date: 20121107
Lead Channel Impedance Value: 300 Ohm
Lead Channel Impedance Value: 562.5 Ohm
Lead Channel Pacing Threshold Amplitude: 0.75 V
Lead Channel Pacing Threshold Amplitude: 1.625 V
Lead Channel Pacing Threshold Pulse Width: 0.5 ms
Lead Channel Setting Sensing Sensitivity: 0.5 mV
MDC IDC LEAD IMPLANT DT: 20121107
MDC IDC LEAD LOCATION: 753859
MDC IDC LEAD LOCATION: 753860
MDC IDC MSMT LEADCHNL RA SENSING INTR AMPL: 2.4 mV
MDC IDC MSMT LEADCHNL RV PACING THRESHOLD PULSEWIDTH: 0.5 ms
MDC IDC MSMT LEADCHNL RV SENSING INTR AMPL: 11.5 mV
MDC IDC PG IMPLANT DT: 20121107
MDC IDC SET LEADCHNL RA PACING AMPLITUDE: 2 V
MDC IDC SET LEADCHNL RV PACING AMPLITUDE: 2.5 V
MDC IDC SET LEADCHNL RV PACING PULSEWIDTH: 0.7 ms
MDC IDC STAT BRADY RV PERCENT PACED: 0 %
Pulse Gen Serial Number: 1033795

## 2017-10-23 NOTE — Progress Notes (Signed)
Pt walked in stating his ICD was vibrating. Vibratory alert d/t HV lead impedance greater than upper limit. Trend shows gradual increase in impedance since device implant, Impedance testing ran manually impedance measured 105ohms, all other device measurements WNL. Informed pt that I would review this with Dr. Caryl Comes and call him back if SK had any further recommendations.

## 2017-10-24 DIAGNOSIS — Z452 Encounter for adjustment and management of vascular access device: Secondary | ICD-10-CM | POA: Diagnosis not present

## 2017-10-24 DIAGNOSIS — Z5181 Encounter for therapeutic drug level monitoring: Secondary | ICD-10-CM | POA: Diagnosis not present

## 2017-10-24 DIAGNOSIS — E785 Hyperlipidemia, unspecified: Secondary | ICD-10-CM | POA: Diagnosis not present

## 2017-10-24 DIAGNOSIS — I130 Hypertensive heart and chronic kidney disease with heart failure and stage 1 through stage 4 chronic kidney disease, or unspecified chronic kidney disease: Secondary | ICD-10-CM | POA: Diagnosis not present

## 2017-10-24 DIAGNOSIS — Z950 Presence of cardiac pacemaker: Secondary | ICD-10-CM | POA: Diagnosis not present

## 2017-10-24 DIAGNOSIS — N183 Chronic kidney disease, stage 3 (moderate): Secondary | ICD-10-CM | POA: Diagnosis not present

## 2017-10-24 DIAGNOSIS — I5023 Acute on chronic systolic (congestive) heart failure: Secondary | ICD-10-CM | POA: Diagnosis not present

## 2017-10-24 DIAGNOSIS — I25110 Atherosclerotic heart disease of native coronary artery with unstable angina pectoris: Secondary | ICD-10-CM | POA: Diagnosis not present

## 2017-10-25 ENCOUNTER — Ambulatory Visit (INDEPENDENT_AMBULATORY_CARE_PROVIDER_SITE_OTHER): Payer: Medicare Other | Admitting: *Deleted

## 2017-10-25 DIAGNOSIS — I255 Ischemic cardiomyopathy: Secondary | ICD-10-CM

## 2017-10-25 LAB — CUP PACEART REMOTE DEVICE CHECK
Battery Remaining Longevity: 22 mo
Battery Remaining Percentage: 25 %
Battery Voltage: 2.81 V
Brady Statistic AP VP Percent: 1 %
Brady Statistic AP VS Percent: 1.6 %
Brady Statistic AS VP Percent: 1 %
Brady Statistic AS VS Percent: 95 %
Brady Statistic RA Percent Paced: 1 %
Brady Statistic RV Percent Paced: 1 %
Date Time Interrogation Session: 20191010060017
HighPow Impedance: 97 Ohm
Implantable Lead Implant Date: 20121107
Implantable Lead Implant Date: 20121107
Implantable Lead Location: 753859
Implantable Lead Location: 753860
Implantable Lead Model: 7000
Implantable Pulse Generator Implant Date: 20121107
Lead Channel Impedance Value: 300 Ohm
Lead Channel Impedance Value: 540 Ohm
Lead Channel Pacing Threshold Amplitude: 0.75 V
Lead Channel Pacing Threshold Amplitude: 1.25 V
Lead Channel Pacing Threshold Pulse Width: 0.5 ms
Lead Channel Pacing Threshold Pulse Width: 0.7 ms
Lead Channel Sensing Intrinsic Amplitude: 1.8 mV
Lead Channel Sensing Intrinsic Amplitude: 11.5 mV
Lead Channel Setting Pacing Amplitude: 2 V
Lead Channel Setting Pacing Amplitude: 2.5 V
Lead Channel Setting Pacing Pulse Width: 0.7 ms
Lead Channel Setting Sensing Sensitivity: 0.5 mV
Pulse Gen Serial Number: 1033795

## 2017-10-25 NOTE — Progress Notes (Signed)
Remote ICD transmission.   

## 2017-11-01 ENCOUNTER — Encounter: Payer: Self-pay | Admitting: Cardiology

## 2017-11-01 DIAGNOSIS — I509 Heart failure, unspecified: Secondary | ICD-10-CM | POA: Diagnosis not present

## 2017-11-01 DIAGNOSIS — I130 Hypertensive heart and chronic kidney disease with heart failure and stage 1 through stage 4 chronic kidney disease, or unspecified chronic kidney disease: Secondary | ICD-10-CM | POA: Diagnosis not present

## 2017-11-01 DIAGNOSIS — N183 Chronic kidney disease, stage 3 (moderate): Secondary | ICD-10-CM | POA: Diagnosis not present

## 2017-11-01 DIAGNOSIS — I255 Ischemic cardiomyopathy: Secondary | ICD-10-CM | POA: Diagnosis not present

## 2017-11-01 DIAGNOSIS — Z452 Encounter for adjustment and management of vascular access device: Secondary | ICD-10-CM | POA: Diagnosis not present

## 2017-11-01 DIAGNOSIS — I25110 Atherosclerotic heart disease of native coronary artery with unstable angina pectoris: Secondary | ICD-10-CM | POA: Diagnosis not present

## 2017-11-01 DIAGNOSIS — I5022 Chronic systolic (congestive) heart failure: Secondary | ICD-10-CM | POA: Diagnosis not present

## 2017-11-02 ENCOUNTER — Other Ambulatory Visit (HOSPITAL_COMMUNITY): Payer: Self-pay | Admitting: Internal Medicine

## 2017-11-07 DIAGNOSIS — I25110 Atherosclerotic heart disease of native coronary artery with unstable angina pectoris: Secondary | ICD-10-CM | POA: Diagnosis not present

## 2017-11-07 DIAGNOSIS — I5022 Chronic systolic (congestive) heart failure: Secondary | ICD-10-CM | POA: Diagnosis not present

## 2017-11-07 DIAGNOSIS — N183 Chronic kidney disease, stage 3 (moderate): Secondary | ICD-10-CM | POA: Diagnosis not present

## 2017-11-07 DIAGNOSIS — I255 Ischemic cardiomyopathy: Secondary | ICD-10-CM | POA: Diagnosis not present

## 2017-11-07 DIAGNOSIS — Z452 Encounter for adjustment and management of vascular access device: Secondary | ICD-10-CM | POA: Diagnosis not present

## 2017-11-07 DIAGNOSIS — I130 Hypertensive heart and chronic kidney disease with heart failure and stage 1 through stage 4 chronic kidney disease, or unspecified chronic kidney disease: Secondary | ICD-10-CM | POA: Diagnosis not present

## 2017-11-09 ENCOUNTER — Other Ambulatory Visit (HOSPITAL_COMMUNITY): Payer: Self-pay | Admitting: Internal Medicine

## 2017-11-14 ENCOUNTER — Other Ambulatory Visit: Payer: Self-pay

## 2017-11-14 ENCOUNTER — Encounter (HOSPITAL_COMMUNITY): Payer: Self-pay | Admitting: Internal Medicine

## 2017-11-14 ENCOUNTER — Ambulatory Visit (HOSPITAL_COMMUNITY)
Admission: RE | Admit: 2017-11-14 | Discharge: 2017-11-14 | Disposition: A | Discharge status: Home / Self Care | Payer: Medicare Other | Source: Ambulatory Visit | Attending: Internal Medicine | Admitting: Internal Medicine

## 2017-11-14 VITALS — BP 118/70 | HR 96 | Wt 158.0 lb

## 2017-11-14 DIAGNOSIS — I130 Hypertensive heart and chronic kidney disease with heart failure and stage 1 through stage 4 chronic kidney disease, or unspecified chronic kidney disease: Secondary | ICD-10-CM | POA: Diagnosis not present

## 2017-11-14 DIAGNOSIS — E785 Hyperlipidemia, unspecified: Secondary | ICD-10-CM | POA: Diagnosis not present

## 2017-11-14 DIAGNOSIS — I25110 Atherosclerotic heart disease of native coronary artery with unstable angina pectoris: Secondary | ICD-10-CM | POA: Diagnosis not present

## 2017-11-14 DIAGNOSIS — Z955 Presence of coronary angioplasty implant and graft: Secondary | ICD-10-CM | POA: Diagnosis not present

## 2017-11-14 DIAGNOSIS — I252 Old myocardial infarction: Secondary | ICD-10-CM | POA: Insufficient documentation

## 2017-11-14 DIAGNOSIS — I10 Essential (primary) hypertension: Secondary | ICD-10-CM

## 2017-11-14 DIAGNOSIS — I255 Ischemic cardiomyopathy: Secondary | ICD-10-CM | POA: Diagnosis not present

## 2017-11-14 DIAGNOSIS — M109 Gout, unspecified: Secondary | ICD-10-CM | POA: Insufficient documentation

## 2017-11-14 DIAGNOSIS — N183 Chronic kidney disease, stage 3 (moderate): Secondary | ICD-10-CM | POA: Diagnosis not present

## 2017-11-14 DIAGNOSIS — I2510 Atherosclerotic heart disease of native coronary artery without angina pectoris: Secondary | ICD-10-CM | POA: Diagnosis not present

## 2017-11-14 DIAGNOSIS — Z79899 Other long term (current) drug therapy: Secondary | ICD-10-CM | POA: Insufficient documentation

## 2017-11-14 DIAGNOSIS — Z452 Encounter for adjustment and management of vascular access device: Secondary | ICD-10-CM | POA: Diagnosis not present

## 2017-11-14 DIAGNOSIS — Z7982 Long term (current) use of aspirin: Secondary | ICD-10-CM | POA: Insufficient documentation

## 2017-11-14 DIAGNOSIS — I5022 Chronic systolic (congestive) heart failure: Secondary | ICD-10-CM | POA: Diagnosis not present

## 2017-11-14 NOTE — Progress Notes (Addendum)
Patient ID: Marvin Martin, male   DOB: Dec 20, 1965, 52 y.o.   MRN: [id number]    Advanced Heart Failure Clinic Note   Primary Care: Dr Laney Pastor Primary Cardiologist: Dr. Haroldine Laws  Nephrologist: Dr Joelyn Oms   HPI: Marvin Martin is a 52 year old male with history of CAD, ICM, MI X4  with 17 stents, chronic systolic HF with EF 24% s/p St. Jude ICD, HTN, gout, and hyperlipidemia.  He just relocated to [facility] from [place]. His medical care was provided by [facility]. He has struggled with in-stent restenosis. Most recent heart cath was February 2016 with stent placed.   Admitted May 8th through May 12th, 2017 with chest pain. Had LHC as noted below no intervention.   Admitted June 5th through June 14th, 8250 with A/C systolic CHF. Had repeat R/LHC as below with stable CAD and low output. Started on milrinone and weaned to 0.125 mcg/kg/min for d/c. Coreg, lasix, and spiro stopped.  Entresto dropped from full dose to 24/26 mg, subsequently stopped.   Today he returns for HF follow up. Last visit entresto was increased to 49-51 mg twice a day. He remains on milrinone 0.125 mcg. Overall feeling fine. Denies SOB/PND/Orthopnea. Appetite ok. No fever or chills. Weight at homepounds. Taking all medications. Followed by [facility] for home milrinone. His 67 year old son lives with him.   01/2015 ECHO  EF 20-25% RV normal  03/21/15: EF 15-20% RV ok  03/2016: Echo EF 15-20%.   Corevue:  R/LHC 06/23/15  Dist RCA-1 lesion, 100% stenosed. The lesion was previously treated with a stent (unknown type).  Dist RCA-2 lesion, 100% stenosed. The lesion was previously treated with a stent (unknown type).  Prox RCA lesion, 70% stenosed. The lesion was previously treated with a stent (unknown type) and angioplasty.  Mid RCA lesion, 50% stenosed. The lesion was previously treated with a stent (unknown type).  Ost RPDA to RPDA lesion, 75% stenosed. The lesion was previously treated with a drug-eluting stent.  Mid  LAD lesion, 70% stenosed.  Ost 2nd Diag lesion, 70% stenosed. Ao = 87/62 (71) LV = 81/9 RA = 2 RV = 20/0/2 PA = 24/12 (16) PCW = 4 Fick cardiac output/index = 3.1/1.8 Thermo CO/CI = 3.0/1.7 PVR = 3.9 WU Ao sat = 97% PA sat = 54%, 55%  R/LHC 05/27/15  Dist RCA-1 lesion, 100% stenosed. The lesion was previously treated with a stent (unknown type).  Dist RCA-2 lesion, 100% stenosed. The lesion was previously treated with a stent (unknown type).  Prox RCA lesion, 60% stenosed. The lesion was previously treated with a stent (unknown type) and angioplasty.  Mid RCA lesion, 40% stenosed. The lesion was previously treated with a stent (unknown type). Findings: RA = 1 RV = 22/1/5 PA = 27/13 (19) PCW = 6 Ao = 120/78 (96) LV = 124/4/13 Fick cardiac output/index = 3.3/1.9 PVR = 4.0 WU SVR = 2335  FA sat = 94% PA sat = 60%. 60%  CPX 01/15/15 FVC 2.67 (65%)    FEV1 2.18 (64%)     FEV1/FVC 81 (100%)     MVV 105 (73%) Peak VO2: 20.3 (56.3% predicted peak VO2) VE/VCO2 slope: 26.8 OUES: 1.52 Peak RER: 1.21  CPX 06/2015  Peak VO2: 19.9 (55.8% predicted peak VO2) VE/VCO2 slope: 31.6 OUES: 1.42 Peak RER: 1.26  Labs: 05/08/2016: Creatinine 2.44 K 4.8   SH: Separated from his wife. Has 4 children. 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  Father deceased but he is unsure of the cause. He had hyperlipidemia.         Sister: Heart disease  [facility]  [address] [place] [address] [phone]  Past Medical History:  Diagnosis Date  . Chronic systolic CHF (congestive heart failure) ([place])    a. 01/2015 Echo: EF 20-25%, antlat, inflat AK.  [name] CKD (chronic kidney disease), stage III ([place])   . Coronary artery disease    a. s/p MI x 4;  b. Reported h/o 17 stents with recurrent ISR;  c. 02/2014 Cath ([place]): PCI to unknown vessel;  d. 01/2015 MV: EF 18%, large scar, no ischemia; e. 03/2015 PCI: LM nl, LAD nl, D2  patent stent, RI patent stent, LCX patent stents p/m, RCA 95p ISR (PTCA), 9m, 100d into RPL CTO, RPDA 90 (2.5x16 Synergy DES).  . Hyperlipidemia   . Hypertensive heart disease   . Ischemic cardiomyopathy    a. s/p SJM ICD 2007 w/ gen change in 2012; b. 12/2014 CPX Test: mild to mod HF limitation w/ pVO2 20.3 and nl slope;  c. 01/2015 Echo: EF 20-25%.   Current Outpatient Medications  Medication Sig Dispense Refill  . allopurinol (ZYLOPRIM) 100 MG tablet Take 100 mg by mouth 2 (two) times daily.     . AMBULATORY NON FORMULARY MEDICATION Inject 0.125 mcg/kg/min into the vein continuous. Medication Name: Milrinone    . amLODipine (NORVASC) 10 MG tablet TAKE 1 TABLET BY MOUTH EVERY DAY 30 tablet 3  . aspirin 81 MG tablet Take 81 mg by mouth daily.    [name] BRILINTA 90 MG TABS tablet TAKE 1 TABLET BY MOUTH TWICE A DAY 180 tablet 1  . cholecalciferol (VITAMIN D) 1000 UNITS tablet Take 1,000 Units by mouth daily.    [name] 5 MG TABS tablet TAKE 1 TABLET (5 MG TOTAL) BY MOUTH 2 (TWO) TIMES DAILY WITH A MEAL. 60 tablet 3  . ENTRESTO 49-51 MG TAKE 1 TABLET BY MOUTH TWICE A DAY 60 tablet 3  . ezetimibe (ZETIA) 10 MG tablet TAKE 1 TABLET BY MOUTH EVERY DAY 90 tablet 0  . isosorbide mononitrate (IMDUR) 30 MG 24 hr tablet Take 1 tablet (30 mg total) daily by mouth. 90 tablet 3  . Magnesium Oxide 200 MG TABS Take 1 tablet (200 mg total) by mouth 2 (two) times daily. 180 tablet 3  . nitroGLYCERIN (NITROSTAT) 0.4 MG SL tablet Place 1 tablet (0.4 mg total) under the tongue every 5 (five) minutes as needed for chest pain. Reported on 04/12/2015 60 tablet 1  . omega-3 acid ethyl esters (LOVAZA) 1 G capsule Take 1 g by mouth daily.     . ondansetron (ZOFRAN) 4 MG tablet Take 1 tablet (4 mg total) every 8 (eight) hours as needed by mouth for nausea or vomiting. Reported on 05/24/2015 30 tablet 1  . PRALUENT 75 MG/ML SOPN INJECT 75MG  (1 PEN) SUBCUTANEOUSLY EVERY 14 DAYS 2 pen 11  . rosuvastatin (CRESTOR) 40 MG tablet  TAKE 1 TABLET BY MOUTH EVERY DAY 30 tablet 11  . hydrALAZINE (APRESOLINE) 50 MG tablet Take 1 tablet (50 mg total) by mouth 3 (three) times daily. 90 tablet 3   No current facility-administered medications for this encounter.    No Known Allergies   Social History   Socioeconomic History  . Marital status: Legally Separated    Spouse name: Not on file  . Number of children: Not on file  . Years of education: Not on file  .  Highest education level: Not on file  Occupational History  . Not on file  Social Needs  . Financial resource strain: Not on file  . Food insecurity:    Worry: Not on file    Inability: Not on file  . Transportation needs:    Medical: Not on file    Non-medical: Not on file  Tobacco Use  . Smoking status: Never Smoker  . Smokeless tobacco: Never Used  Substance and Sexual Activity  . Alcohol use: No    Alcohol/week: 0.0 standard drinks  . Drug use: No  . Sexual activity: Never  Lifestyle  . Physical activity:    Days per week: Not on file    Minutes per session: Not on file  . Stress: Not on file  Relationships  . Social connections:    Talks on phone: Not on file    Gets together: Not on file    Attends religious service: Not on file    Active member of club or organization: Not on file    Attends meetings of clubs or organizations: Not on file    Relationship status: Not on file  . Intimate partner violence:    Fear of current or ex partner: Not on file    Emotionally abused: Not on file    Physically abused: Not on file    Forced sexual activity: Not on file  Other Topics Concern  . Not on file  Social History Narrative   Moved to [place] from [place] in 2016.  Lives alone in [place].    Family History  Problem Relation Age of Onset  . Cancer Mother        died when pt was 38.  She had a h/o heart dzs as well.  . Hypertension Mother   . Hyperlipidemia Father        Father died when pt was age 36 - he believes that he had a cardiac hx.  [name] CAD  Sister    Vitals:   11/14/17 1430  BP: 118/70  Pulse: 96  SpO2: 100%  Weight: 71.7 kg (158 lb)     Wt Readings from Last 3 Encounters:  11/14/17 71.7 kg (158 lb)  04/10/17 72.3 kg (159 lb 8 oz)  02/19/17 72.5 kg (159 lb 12.8 oz)     PHYSICAL EXAM: General:  Well appearing. No resp difficulty HEENT: normal Neck: supple. no JVD. Carotids 2+ bilat; no bruits. No lymphadenopathy or thryomegaly appreciated. Cor: PMI nondisplaced. Regular rate & rhythm. No rubs, gallops or murmurs. Lungs: clear Abdomen: soft, nontender, nondistended. No hepatosplenomegaly. No bruits or masses. Good bowel sounds. Extremities: no cyanosis, clubbing, rash, edema. RUE PICC double lumen.  Neuro: alert & orientedx3, cranial nerves grossly intact. moves all 4 extremities w/o difficulty. Affect pleasant   ASSESSMENT & PLAN: 1. Chronic Systolic Heart Failure - ICM.  EF 15-20% on echo 3/18. RV ok. Has ST Jude ICD NYHA II on chronic milrinone 0.125 mcg. Via PICC. At some point we will put tunneled PICC.  - Corvue -No fluid. No VT. -  - Continue lasix as needed.  - No BB low output. - Continue corlanor 5 mg BID - Continue entresto to 49/51 mg BID. Follow renal function  - Continue hydralazine 50 mg TID. - Continue imdur 30 mg daily.  - Not a candidate for transplant/advanced therapies due to lack of social support. Also his renal function prohibits VAD. He does not want to pursue transplant.  - He understand milrinone will  not last forever.  - Reinforced fluid restriction to < 2 L daily, sodium restriction to less than 2000 mg daily, and the importance of daily weights.   2. CAD- MI X4 17 stents  Most recent [place] 06/2015 - no intervention continued medical management.    Dist RCA-1 lesion, 100% stenosed. The lesion was previously treated with a stent (unknown type).  Dist RCA-2 lesion, 100% stenosed. The lesion was previously treated with a stent (unknown type).  Prox RCA lesion, 70% stenosed. The lesion  was previously treated with a stent (unknown type) and angioplasty.  Mid RCA lesion, 50% stenosed. The lesion was previously treated with a stent (unknown type).  Ost RPDA to RPDA lesion, 75% stenosed. The lesion was previously treated with a drug-eluting stent.  Mid LAD lesion, 70% stenosed.  Ost 2nd Diag lesion, 70% stenosed. - No BB with low output on home milrinone. -No s/s ischemia   - Continue daily crestor.  3. HTN -Stable. Continue current medications. 4. Hyperlipidemia - On Praulent and statin. No change.  5. CKD stage III  - Creatinine base 2.2-2.4. Continue to follow closely via [place]. 6. Palpitations - Resolved. If recurrent will check holter monitor.  Follow up in 3 months with an ECHO.    Darrick Grinder, NP  2:38 PM   .Patient seen and examined with Darrick Grinder, NP. We discussed all aspects of the encounter. I agree with the assessment and plan as stated above.   Doing very well on milrinone. NYHA II. Volume status looks good. No CP. BP much improved. PICC site looks very good. Creatinine stable. Will continue current therapy. Not candidate for VAD with CKD III-IV. ICD interrogated personally. No VT/AF. Volume status looks good.   Glori Bickers, MD  3:10 PM

## 2017-11-14 NOTE — Patient Instructions (Signed)
Your physician recommends that you schedule a follow-up appointment in: 3 months with echocardiogram  

## 2017-11-21 DIAGNOSIS — I130 Hypertensive heart and chronic kidney disease with heart failure and stage 1 through stage 4 chronic kidney disease, or unspecified chronic kidney disease: Secondary | ICD-10-CM | POA: Diagnosis not present

## 2017-11-21 DIAGNOSIS — I25110 Atherosclerotic heart disease of native coronary artery with unstable angina pectoris: Secondary | ICD-10-CM | POA: Diagnosis not present

## 2017-11-21 DIAGNOSIS — N183 Chronic kidney disease, stage 3 (moderate): Secondary | ICD-10-CM | POA: Diagnosis not present

## 2017-11-21 DIAGNOSIS — I5022 Chronic systolic (congestive) heart failure: Secondary | ICD-10-CM | POA: Diagnosis not present

## 2017-11-21 DIAGNOSIS — Z452 Encounter for adjustment and management of vascular access device: Secondary | ICD-10-CM | POA: Diagnosis not present

## 2017-11-21 DIAGNOSIS — I255 Ischemic cardiomyopathy: Secondary | ICD-10-CM | POA: Diagnosis not present

## 2017-11-28 DIAGNOSIS — N183 Chronic kidney disease, stage 3 (moderate): Secondary | ICD-10-CM | POA: Diagnosis not present

## 2017-11-28 DIAGNOSIS — I5022 Chronic systolic (congestive) heart failure: Secondary | ICD-10-CM | POA: Diagnosis not present

## 2017-11-28 DIAGNOSIS — I25110 Atherosclerotic heart disease of native coronary artery with unstable angina pectoris: Secondary | ICD-10-CM | POA: Diagnosis not present

## 2017-11-28 DIAGNOSIS — Z452 Encounter for adjustment and management of vascular access device: Secondary | ICD-10-CM | POA: Diagnosis not present

## 2017-11-28 DIAGNOSIS — I130 Hypertensive heart and chronic kidney disease with heart failure and stage 1 through stage 4 chronic kidney disease, or unspecified chronic kidney disease: Secondary | ICD-10-CM | POA: Diagnosis not present

## 2017-11-28 DIAGNOSIS — I255 Ischemic cardiomyopathy: Secondary | ICD-10-CM | POA: Diagnosis not present

## 2017-12-05 DIAGNOSIS — I5022 Chronic systolic (congestive) heart failure: Secondary | ICD-10-CM | POA: Diagnosis not present

## 2017-12-05 DIAGNOSIS — Z452 Encounter for adjustment and management of vascular access device: Secondary | ICD-10-CM | POA: Diagnosis not present

## 2017-12-05 DIAGNOSIS — I255 Ischemic cardiomyopathy: Secondary | ICD-10-CM | POA: Diagnosis not present

## 2017-12-05 DIAGNOSIS — N183 Chronic kidney disease, stage 3 (moderate): Secondary | ICD-10-CM | POA: Diagnosis not present

## 2017-12-05 DIAGNOSIS — I130 Hypertensive heart and chronic kidney disease with heart failure and stage 1 through stage 4 chronic kidney disease, or unspecified chronic kidney disease: Secondary | ICD-10-CM | POA: Diagnosis not present

## 2017-12-05 DIAGNOSIS — I25110 Atherosclerotic heart disease of native coronary artery with unstable angina pectoris: Secondary | ICD-10-CM | POA: Diagnosis not present

## 2017-12-05 DIAGNOSIS — I5023 Acute on chronic systolic (congestive) heart failure: Secondary | ICD-10-CM | POA: Diagnosis not present

## 2017-12-12 DIAGNOSIS — I255 Ischemic cardiomyopathy: Secondary | ICD-10-CM | POA: Diagnosis not present

## 2017-12-12 DIAGNOSIS — I25110 Atherosclerotic heart disease of native coronary artery with unstable angina pectoris: Secondary | ICD-10-CM | POA: Diagnosis not present

## 2017-12-12 DIAGNOSIS — I130 Hypertensive heart and chronic kidney disease with heart failure and stage 1 through stage 4 chronic kidney disease, or unspecified chronic kidney disease: Secondary | ICD-10-CM | POA: Diagnosis not present

## 2017-12-12 DIAGNOSIS — N183 Chronic kidney disease, stage 3 (moderate): Secondary | ICD-10-CM | POA: Diagnosis not present

## 2017-12-12 DIAGNOSIS — Z452 Encounter for adjustment and management of vascular access device: Secondary | ICD-10-CM | POA: Diagnosis not present

## 2017-12-12 DIAGNOSIS — I5022 Chronic systolic (congestive) heart failure: Secondary | ICD-10-CM | POA: Diagnosis not present

## 2017-12-15 DIAGNOSIS — I130 Hypertensive heart and chronic kidney disease with heart failure and stage 1 through stage 4 chronic kidney disease, or unspecified chronic kidney disease: Secondary | ICD-10-CM | POA: Insufficient documentation

## 2017-12-15 DIAGNOSIS — Z79899 Other long term (current) drug therapy: Secondary | ICD-10-CM | POA: Insufficient documentation

## 2017-12-15 DIAGNOSIS — K029 Dental caries, unspecified: Secondary | ICD-10-CM | POA: Insufficient documentation

## 2017-12-15 DIAGNOSIS — I2510 Atherosclerotic heart disease of native coronary artery without angina pectoris: Secondary | ICD-10-CM | POA: Diagnosis not present

## 2017-12-15 DIAGNOSIS — K0889 Other specified disorders of teeth and supporting structures: Secondary | ICD-10-CM | POA: Diagnosis not present

## 2017-12-15 DIAGNOSIS — N183 Chronic kidney disease, stage 3 (moderate): Secondary | ICD-10-CM | POA: Diagnosis not present

## 2017-12-15 DIAGNOSIS — Z7982 Long term (current) use of aspirin: Secondary | ICD-10-CM | POA: Insufficient documentation

## 2017-12-15 DIAGNOSIS — I5022 Chronic systolic (congestive) heart failure: Secondary | ICD-10-CM | POA: Insufficient documentation

## 2017-12-15 NOTE — ED Triage Notes (Signed)
Pt is here with left lower back molar pain and hurts with biting down.

## 2017-12-16 ENCOUNTER — Encounter (HOSPITAL_COMMUNITY): Payer: Self-pay | Admitting: *Deleted

## 2017-12-16 ENCOUNTER — Emergency Department (HOSPITAL_COMMUNITY)
Admission: EM | Admit: 2017-12-16 | Discharge: 2017-12-16 | Disposition: A | Discharge status: Home / Self Care | Payer: Medicare Other | Source: Home / Self Care | Attending: Emergency Medicine | Admitting: Emergency Medicine

## 2017-12-16 ENCOUNTER — Other Ambulatory Visit: Payer: Self-pay

## 2017-12-16 ENCOUNTER — Emergency Department (HOSPITAL_COMMUNITY)
Admission: EM | Admit: 2017-12-16 | Discharge: 2017-12-16 | Disposition: A | Discharge status: Home / Self Care | Payer: Medicare Other | Attending: Emergency Medicine | Admitting: Emergency Medicine

## 2017-12-16 DIAGNOSIS — I5022 Chronic systolic (congestive) heart failure: Secondary | ICD-10-CM

## 2017-12-16 DIAGNOSIS — I2510 Atherosclerotic heart disease of native coronary artery without angina pectoris: Secondary | ICD-10-CM | POA: Insufficient documentation

## 2017-12-16 DIAGNOSIS — N183 Chronic kidney disease, stage 3 (moderate): Secondary | ICD-10-CM

## 2017-12-16 DIAGNOSIS — K0889 Other specified disorders of teeth and supporting structures: Secondary | ICD-10-CM

## 2017-12-16 DIAGNOSIS — K029 Dental caries, unspecified: Secondary | ICD-10-CM | POA: Insufficient documentation

## 2017-12-16 DIAGNOSIS — I130 Hypertensive heart and chronic kidney disease with heart failure and stage 1 through stage 4 chronic kidney disease, or unspecified chronic kidney disease: Secondary | ICD-10-CM

## 2017-12-16 DIAGNOSIS — Z7982 Long term (current) use of aspirin: Secondary | ICD-10-CM | POA: Insufficient documentation

## 2017-12-16 DIAGNOSIS — Z79899 Other long term (current) drug therapy: Secondary | ICD-10-CM

## 2017-12-16 MED ORDER — HYDROCODONE-ACETAMINOPHEN 5-325 MG PO TABS: 1.0000 | ORAL_TABLET | ORAL | 0 refills | Status: DC | PRN

## 2017-12-16 MED ORDER — PENICILLIN V POTASSIUM 500 MG PO TABS: 500.0000 mg | ORAL_TABLET | Freq: Four times a day (QID) | ORAL | 0 refills | Status: DC

## 2017-12-16 MED ORDER — PENICILLIN V POTASSIUM 250 MG PO TABS
500.0000 mg | ORAL_TABLET | Freq: Once | ORAL | Status: AC
  Administered 2017-12-16: 500 mg via ORAL
  Filled 2017-12-16: qty 2

## 2017-12-16 MED ORDER — CHLORHEXIDINE GLUCONATE 0.12 % MT SOLN: 15.0000 mL | Freq: Two times a day (BID) | OROMUCOSAL | 0 refills | Status: DC

## 2017-12-16 NOTE — Discharge Instructions (Addendum)
Continue taking Penicillin 4 times daily Take pain medicine as prescribed. Do not drink alcohol or drive while taking this medicine. Do not take more than as directed You can use Lollicaine every couple hours to numb the area Please follow up with a dentist Return if you develop a fever, severe facial swelling, or can't swallow

## 2017-12-16 NOTE — ED Provider Notes (Addendum)
[facility] EMERGENCY DEPARTMENT Provider Note   CSN: [id number] Arrival date & time: 12/15/17  2347     History   Chief Complaint Chief Complaint  Patient presents with  . Dental Pain    HPI Marvin Martin is a 52 y.o. male.  Patient presents to the emergency department with a dental complaint. Symptoms began 3 days ago. The patient has tried to alleviate pain with orajel.  Pain rated at a 10/10, characterized as throbbing in nature and located left lower rear molar. Patient denies fever, night sweats, chills, difficulty swallowing or opening mouth, SOB, nuchal rigidity or decreased ROM of neck.  Patient does not have a dentist and requests a resource guide at discharge.   The history is provided by the patient. No language interpreter was used.    Past Medical History:  Diagnosis Date  . Chronic systolic CHF (congestive heart failure) ([place])    a. 01/2015 Echo: EF 20-25%, antlat, inflat AK.  [name] CKD (chronic kidney disease), stage III ([place])   . Coronary artery disease    a. s/p MI x 4;  b. Reported h/o 17 stents with recurrent ISR;  c. 02/2014 Cath ([place]): PCI to unknown vessel;  d. 01/2015 MV: EF 18%, large scar, no ischemia; e. 03/2015 PCI: LM nl, LAD nl, D2 patent stent, RI patent stent, LCX patent stents p/m, RCA 95p ISR (PTCA), 92m, 100d into RPL CTO, RPDA 90 (2.5x16 Synergy DES).  . Hyperlipidemia   . Hypertensive heart disease   . Ischemic cardiomyopathy    a. s/p SJM ICD 2007 w/ gen change in 2012; b. 12/2014 CPX Test: mild to mod HF limitation w/ pVO2 20.3 and nl slope;  c. 01/2015 Echo: EF 20-25%.    Patient Active Problem List   Diagnosis Date Noted  . Palliative care encounter   . Unstable angina ([place]) 06/22/2015  . Recurrent angina status post coronary stent placement ([place])   . CHF exacerbation ([place]) 05/24/2015  . CAD (coronary artery disease) 04/03/2015  . Hypertensive heart disease 04/03/2015  . Hyperlipidemia 04/03/2015  . ST elevation  (STEMI) myocardial infarction involving right coronary artery ([place]) 03/31/2015  . Ischemic chest pain ([place])   . Cardiomyopathy, ischemic   . Chest pressure 02/09/2015  . CKD (chronic kidney disease) stage 3, GFR 30-59 ml/min ([facility]) 02/09/2015  . Chronic systolic heart failure ([place]) 01/07/2015  . CAD in native artery 01/07/2015  . Benign essential HTN 01/07/2015  . CHF (congestive heart failure) ([place]) 12/11/2014    Past Surgical History:  Procedure Laterality Date  . 17 stints placements Bilateral   . CARDIAC CATHETERIZATION     Most recent 02/2014   . CARDIAC CATHETERIZATION N/A 03/31/2015   Procedure: Left Heart Cath and Coronary Angiography;  Surgeon: Troy Sine, MD;  Location: [facility] [facility] [place] CV LAB;  Service: Cardiovascular;  Laterality: N/A;  . CARDIAC CATHETERIZATION N/A 03/31/2015   Procedure: Coronary Stent Intervention;  Surgeon: Troy Sine, MD;  Location: [place] CV LAB;  Service: Cardiovascular;  Laterality: N/A;  . CARDIAC CATHETERIZATION N/A 05/27/2015   Procedure: Right/Left Heart Cath and Coronary Angiography;  Surgeon: Jolaine Artist, MD;  Location: [facility] CV LAB;  Service: Cardiovascular;  Laterality: N/A;  . CARDIAC CATHETERIZATION N/A 06/23/2015   Procedure: Right/Left Heart Cath and Coronary Angiography;  Surgeon: Jolaine Artist, MD;  Location: [place] CV LAB;  Service: Cardiovascular;  Laterality: N/A;  . INSERT / REPLACE / REMOVE PACEMAKER     St jude  2006  Generator Change 2012   . PACEMAKER GENERATOR CHANGE Bilateral         Home Medications    Prior to Admission medications   Medication Sig Start Date End Date Taking? Authorizing Provider  allopurinol (ZYLOPRIM) 100 MG tablet Take 100 mg by mouth 2 (two) times daily.     [provider]  AMBULATORY NON FORMULARY MEDICATION Inject 0.125 mcg/kg/min into the vein continuous. Medication Name: Milrinone 08/24/15   Bensimhon, Shaune Pascal, MD  amLODipine (NORVASC) 10 MG tablet TAKE 1  TABLET BY MOUTH EVERY DAY 02/26/17   Bensimhon, Shaune Pascal, MD  aspirin 81 MG tablet Take 81 mg by mouth daily.    [provider]  BRILINTA 90 MG TABS tablet TAKE 1 TABLET BY MOUTH TWICE A DAY 03/05/17   Bensimhon, Shaune Pascal, MD  cholecalciferol (VITAMIN D) 1000 UNITS tablet Take 1,000 Units by mouth daily.    [provider]  CORLANOR 5 MG TABS tablet TAKE 1 TABLET (5 MG TOTAL) BY MOUTH 2 (TWO) TIMES DAILY WITH A MEAL. 01/29/17   Bensimhon, Shaune Pascal, MD  ENTRESTO 49-51 MG TAKE 1 TABLET BY MOUTH TWICE A DAY 08/06/17   Bensimhon, Shaune Pascal, MD  ezetimibe (ZETIA) 10 MG tablet TAKE 1 TABLET BY MOUTH EVERY DAY 11/10/16   Charlott Rakes, MD  hydrALAZINE (APRESOLINE) 50 MG tablet Take 1 tablet (50 mg total) by mouth 3 (three) times daily. 01/22/17 04/22/17  Shirley Friar, PA-C  isosorbide mononitrate (IMDUR) 30 MG 24 hr tablet Take 1 tablet (30 mg total) daily by mouth. 11/20/16 04/11/18  Clegg, Amy D, NP  Magnesium Oxide 200 MG TABS Take 1 tablet (200 mg total) by mouth 2 (two) times daily. 01/04/16   Shirley Friar, PA-C  nitroGLYCERIN (NITROSTAT) 0.4 MG SL tablet Place 1 tablet (0.4 mg total) under the tongue every 5 (five) minutes as needed for chest pain. Reported on 04/12/2015 06/03/15   Charlott Rakes, MD  omega-3 acid ethyl esters (LOVAZA) 1 G capsule Take 1 g by mouth daily.     [provider]  ondansetron (ZOFRAN) 4 MG tablet Take 1 tablet (4 mg total) every 8 (eight) hours as needed by mouth for nausea or vomiting. Reported on 05/24/2015 11/20/16   Conrad Zwingle, NP  PRALUENT 75 MG/ML SOPN INJECT 75MG  (1 PEN) SUBCUTANEOUSLY EVERY 14 DAYS 03/24/16   Bensimhon, Shaune Pascal, MD  rosuvastatin (CRESTOR) 40 MG tablet TAKE 1 TABLET BY MOUTH EVERY DAY 10/02/17   Bensimhon, Shaune Pascal, MD    Family History Family History  Problem Relation Age of Onset  . Cancer Mother        died when pt was 2.  She had a h/o heart dzs as well.  . Hypertension Mother   . Hyperlipidemia  Father        Father died when pt was age 18 - he believes that he had a cardiac hx.  [name] CAD Sister     Social History Social History   Tobacco Use  . Smoking status: Never Smoker  . Smokeless tobacco: Never Used  Substance Use Topics  . Alcohol use: No    Alcohol/week: 0.0 standard drinks  . Drug use: No     Allergies   Patient has no known allergies.   Review of Systems Review of Systems  All other systems reviewed and are negative.    Physical Exam Updated Vital Signs BP (!) 145/89 (BP Location: Left Arm)   Pulse [name])  120   Temp 98.7 F (37.1 C) (Oral)   Resp 14   Ht 5' 7.5" (1.715 m)   Wt 67.6 kg   SpO2 100%   BMI 22.99 kg/m   Physical Exam Physical Exam  Constitutional: Pt appears well-developed and well-nourished.  HENT:  Head: Normocephalic.  Right Ear: Tympanic membrane, external ear and ear canal normal.  Left Ear: Tympanic membrane, external ear and ear canal normal.  Nose: Nose normal. Right sinus exhibits no maxillary sinus tenderness and no frontal sinus tenderness. Left sinus exhibits no maxillary sinus tenderness and no frontal sinus tenderness.  Mouth/Throat: Uvula is midline, oropharynx is clear and moist and mucous membranes are normal. No oral lesions. No uvula swelling or lacerations. No oropharyngeal exudate, posterior oropharyngeal edema, posterior oropharyngeal erythema or tonsillar abscesses.  Poor dentition No gingival swelling, fluctuance or induration No gross abscess  No sublingual edema, tenderness to palpation, or sign of Ludwig's angina, or deep space infection Pain at left lower rear molar, broken tooth Eyes: Conjunctivae are normal. Pupils are equal, round, and reactive to light. Right eye exhibits no discharge. Left eye exhibits no discharge.  Neck: Normal range of motion. Neck supple.  No stridor Handling secretions without difficulty No nuchal rigidity No cervical lymphadenopathy Cardiovascular: mild tachycardia, regular  rhythm and normal heart sounds.   Pulmonary/Chest: Effort normal. No respiratory distress.  Equal chest rise  Abdominal: Soft. Bowel sounds are normal. Pt exhibits no distension. There is no tenderness.  Lymphadenopathy: Pt has no cervical adenopathy.  Neurological: Pt is alert and oriented x 4  Skin: Skin is warm and dry.  Psychiatric: Pt has a normal mood and affect.  Nursing note and vitals reviewed.    ED Treatments / Results  Labs (all labs ordered are listed, but only abnormal results are displayed) Labs Reviewed - No data to display  EKG None  Radiology No results found.  Procedures Procedures (including critical care time)  Medications Ordered in ED Medications - No data to display   Initial Impression / Assessment and Plan / ED Course  I have reviewed the triage vital signs and the nursing notes.  Pertinent labs & imaging results that were available during my care of the patient were reviewed by me and considered in my medical decision making (see chart for details).     Patient with dentalgia.  No abscess requiring immediate incision and drainage.  Exam not concerning for Ludwig's angina or pharyngeal abscess.  Will treat with penicillin. Pt instructed to follow-up with dentist.  Discussed return precautions. Pt safe for discharge.   Final Clinical Impressions(s) / ED Diagnoses   Final diagnoses:  Pain, dental    ED Discharge Orders         Ordered    penicillin v potassium (VEETID) 500 MG tablet  4 times daily     12/16/17 0023    chlorhexidine (PERIDEX) 0.12 % solution  2 times daily     12/16/17 0023           Montine Circle, PA-C 12/16/17 0025    Montine Circle, PA-C 12/16/17 [id number]    Duffy Bruce, MD 12/16/17 [phone]

## 2017-12-16 NOTE — ED Triage Notes (Signed)
PT reports Dental pain is worse. Pt seen and treated at ED last night. Pt taking  All meds.

## 2017-12-16 NOTE — ED Provider Notes (Signed)
[facility] EMERGENCY DEPARTMENT Provider Note   CSN: [id number] Arrival date & time: 12/16/17  0803     History   Chief Complaint Chief Complaint  Patient presents with  . Dental Pain    HPI Josiel Gahm is a 52 y.o. male who presents with dental pain. PMH significant for CHF EF 20-25% s/p AICD, CAD s/p 17 stents, CKD, gout, HLD, HTN. He was seen in th ED ~10 hours ago for dental pain. His HR was noted to be in the 130s at that time. He states that he went home and he took 6 of the penicillin he was prescribed and washed his mouth but the pain has worsened. He started to have palpitations so he decided to come back. He also is having a hard time opening his mouth. No fever or difficulty swallowing. He is not taking anything for pain because he has a lot of health problems and doesn't know what to take. He called his cardiologist's office but they would not make any recommendations as to what he could take for pain.  HPI  Past Medical History:  Diagnosis Date  . Chronic systolic CHF (congestive heart failure) ([place])    a. 01/2015 Echo: EF 20-25%, antlat, inflat AK.  [name] CKD (chronic kidney disease), stage III ([place])   . Coronary artery disease    a. s/p MI x 4;  b. Reported h/o 17 stents with recurrent ISR;  c. 02/2014 Cath ([place]): PCI to unknown vessel;  d. 01/2015 MV: EF 18%, large scar, no ischemia; e. 03/2015 PCI: LM nl, LAD nl, D2 patent stent, RI patent stent, LCX patent stents p/m, RCA 95p ISR (PTCA), 7m, 100d into RPL CTO, RPDA 90 (2.5x16 Synergy DES).  . Hyperlipidemia   . Hypertensive heart disease   . Ischemic cardiomyopathy    a. s/p SJM ICD 2007 w/ gen change in 2012; b. 12/2014 CPX Test: mild to mod HF limitation w/ pVO2 20.3 and nl slope;  c. 01/2015 Echo: EF 20-25%.    Patient Active Problem List   Diagnosis Date Noted  . Unstable angina ([place]) 06/22/2015  . Recurrent angina status post coronary stent placement ([place])   . Hypertensive heart disease  04/03/2015  . Hyperlipidemia 04/03/2015  . ST elevation (STEMI) myocardial infarction involving right coronary artery ([place]) 03/31/2015  . Ischemic chest pain ([place])   . Cardiomyopathy, ischemic   . CKD (chronic kidney disease) stage 3, GFR 30-59 ml/min (HCC) 02/09/2015  . Chronic systolic heart failure ([place]) 01/07/2015  . CAD in native artery 01/07/2015  . Benign essential HTN 01/07/2015  . CHF (congestive heart failure) ([place]) 12/11/2014    Past Surgical History:  Procedure Laterality Date  . 17 stints placements Bilateral   . CARDIAC CATHETERIZATION     Most recent 02/2014   . CARDIAC CATHETERIZATION N/A 03/31/2015   Procedure: Left Heart Cath and Coronary Angiography;  Surgeon: Troy Sine, MD;  Location: [facility];  Service: Cardiovascular;  Laterality: N/A;  . CARDIAC CATHETERIZATION N/A 03/31/2015   Procedure: Coronary Stent Intervention;  Surgeon: Troy Sine, MD;  Location: [facility];  Service: Cardiovascular;  Laterality: N/A;  . CARDIAC CATHETERIZATION N/A 05/27/2015   Procedure: Right/Left Heart Cath and Coronary Angiography;  Surgeon: Jolaine Artist, MD;  Location: [facility];  Service: Cardiovascular;  Laterality: N/A;  . CARDIAC CATHETERIZATION N/A 06/23/2015   Procedure: Right/Left Heart Cath and Coronary Angiography;  Surgeon: Jolaine Artist, MD;  Location:  [facility];  Service: Cardiovascular;  Laterality: N/A;  . INSERT / REPLACE / REMOVE PACEMAKER     St jude 2006  Generator Change 2012   . PACEMAKER GENERATOR CHANGE Bilateral         Home Medications    Prior to Admission medications   Medication Sig Start Date End Date Taking? Authorizing Provider  allopurinol (ZYLOPRIM) 100 MG tablet Take 100 mg by mouth 2 (two) times daily.     [provider]  AMBULATORY NON FORMULARY MEDICATION Inject 0.125 mcg/kg/min into the vein continuous. Medication Name: Milrinone 08/24/15   Bensimhon, Shaune Pascal, MD  amLODipine  (NORVASC) 10 MG tablet TAKE 1 TABLET BY MOUTH EVERY DAY 02/26/17   Bensimhon, Shaune Pascal, MD  aspirin 81 MG tablet Take 81 mg by mouth daily.    [provider]  BRILINTA 90 MG TABS tablet TAKE 1 TABLET BY MOUTH TWICE A DAY 03/05/17   Bensimhon, Shaune Pascal, MD  chlorhexidine (PERIDEX) 0.12 % solution Use as directed 15 mLs in the mouth or throat 2 (two) times daily. 12/16/17   Montine Circle, PA-C  cholecalciferol (VITAMIN D) 1000 UNITS tablet Take 1,000 Units by mouth daily.    [provider]  CORLANOR 5 MG TABS tablet TAKE 1 TABLET (5 MG TOTAL) BY MOUTH 2 (TWO) TIMES DAILY WITH A MEAL. 01/29/17   Bensimhon, Shaune Pascal, MD  ENTRESTO 49-51 MG TAKE 1 TABLET BY MOUTH TWICE A DAY 08/06/17   Bensimhon, Shaune Pascal, MD  ezetimibe (ZETIA) 10 MG tablet TAKE 1 TABLET BY MOUTH EVERY DAY 11/10/16   Charlott Rakes, MD  hydrALAZINE (APRESOLINE) 50 MG tablet Take 1 tablet (50 mg total) by mouth 3 (three) times daily. 01/22/17 04/22/17  Shirley Friar, PA-C  isosorbide mononitrate (IMDUR) 30 MG 24 hr tablet Take 1 tablet (30 mg total) daily by mouth. 11/20/16 04/11/18  Clegg, Amy D, NP  Magnesium Oxide 200 MG TABS Take 1 tablet (200 mg total) by mouth 2 (two) times daily. 01/04/16   Shirley Friar, PA-C  nitroGLYCERIN (NITROSTAT) 0.4 MG SL tablet Place 1 tablet (0.4 mg total) under the tongue every 5 (five) minutes as needed for chest pain. Reported on 04/12/2015 06/03/15   Charlott Rakes, MD  omega-3 acid ethyl esters (LOVAZA) 1 G capsule Take 1 g by mouth daily.     [provider]  ondansetron (ZOFRAN) 4 MG tablet Take 1 tablet (4 mg total) every 8 (eight) hours as needed by mouth for nausea or vomiting. Reported on 05/24/2015 11/20/16   Darrick Grinder D, NP  penicillin v potassium (VEETID) 500 MG tablet Take 1 tablet (500 mg total) by mouth 4 (four) times daily. 12/16/17   Montine Circle, PA-C  PRALUENT 75 MG/ML SOPN INJECT 75MG  (1 PEN) SUBCUTANEOUSLY EVERY 14 DAYS 03/24/16   Bensimhon,  Shaune Pascal, MD  rosuvastatin (CRESTOR) 40 MG tablet TAKE 1 TABLET BY MOUTH EVERY DAY 10/02/17   Bensimhon, Shaune Pascal, MD    Family History Family History  Problem Relation Age of Onset  . Cancer Mother        died when pt was 21.  She had a h/o heart dzs as well.  . Hypertension Mother   . Hyperlipidemia Father        Father died when pt was age 81 - he believes that he had a cardiac hx.  [name] CAD Sister     Social History Social History   Tobacco Use  . Smoking status:  Never Smoker  . Smokeless tobacco: Never Used  Substance Use Topics  . Alcohol use: No    Alcohol/week: 0.0 standard drinks  . Drug use: No     Allergies   Patient has no known allergies.   Review of Systems Review of Systems  Constitutional: Negative for fever.  HENT: Positive for dental problem. Negative for facial swelling.      Physical Exam Updated Vital Signs BP (!) 135/99 (BP Location: Left Arm)   Pulse 62   Temp 98.6 F (37 C) (Oral)   Resp 16   Ht 5' 7.5" (1.715 m)   Wt 67.6 kg   SpO2 100%   BMI 23.00 kg/m   Physical Exam  Constitutional: He is oriented to person, place, and time. He appears well-developed and well-nourished. No distress.  Calm and cooperative  HENT:  Head: Normocephalic and atraumatic.  Mouth/Throat: Oropharynx is clear and moist and mucous membranes are normal. There is trismus in the jaw. Abnormal dentition. No dental abscesses.  Mild trismus. The left posterior lower molar is fractured. No obvious abscess.  Eyes: Pupils are equal, round, and reactive to light. Conjunctivae are normal. Right eye exhibits no discharge. Left eye exhibits no discharge. No scleral icterus.  Neck: Normal range of motion.  Cardiovascular: Normal rate.  Pulmonary/Chest: Effort normal. No respiratory distress.  Abdominal: He exhibits no distension.  Neurological: He is alert and oriented to person, place, and time.  Skin: Skin is warm and dry.  Psychiatric: He has a normal mood and affect.  His behavior is normal.  Nursing note and vitals reviewed.    ED Treatments / Results  Labs (all labs ordered are listed, but only abnormal results are displayed) Labs Reviewed - No data to display  EKG None  Radiology No results found.  Procedures .Nerve Block Date/Time: 12/16/2017 8:55 AM Performed by: Recardo Evangelist, PA-C Authorized by: Recardo Evangelist, PA-C   Consent:    Consent obtained:  Verbal   Consent given by:  Patient   Risks discussed:  Nerve damage and unsuccessful block   Alternatives discussed:  No treatment Indications:    Indications:  Pain relief Location:    Body area:  Head   Head nerve blocked: infraalveolar.   Laterality:  Left Skin anesthesia (see MAR for exact dosages):    Skin anesthesia method:  Topical application   Topical anesthetic:  Benzocaine gel Procedure details (see MAR for exact dosages):    Block needle gauge:  27 G   Anesthetic injected:  Bupivacaine 0.5% WITH epi   Steroid injected:  None   Additive injected:  None   Injection procedure:  Anatomic landmarks identified, anatomic landmarks palpated, incremental injection, introduced needle and negative aspiration for blood   Paresthesia:  None Post-procedure details:    Dressing:  None   Outcome:  Pain improved   Patient tolerance of procedure:  Tolerated well, no immediate complications   (including critical care time)    Medications Ordered in ED Medications - No data to display   Initial Impression / Assessment and Plan / ED Course  I have reviewed the triage vital signs and the nursing notes.  Pertinent labs & imaging results that were available during my care of the patient were reviewed by me and considered in my medical decision making (see chart for details).  52 year old male presents with ongoing dental pain. He is mildly hypertensive but otherwise vitals are normal. Tachycardia from earlier this evening has resolved. On  exam he has mild-moderate  trismus. No evidence of abscess or deep space infection. He was offered a dental block which he was agreeable to. This was performed. He was given an rx for pain medicine. He has a Clinical research associate guide from his earlier visit. He was advised to take medicine as prescribed and not to take more than this as it would likely not improve his pain and could be dangerous. He verbalized understanding.  Final Clinical Impressions(s) / ED Diagnoses   Final diagnoses:  Dental caries  Pain, dental    ED Discharge Orders    None       Recardo Evangelist, PA-C 12/16/17 [id number]    Pattricia Boss, MD 12/16/17 1531

## 2017-12-16 NOTE — ED Provider Notes (Addendum)
After discharge notified by RN that patient states HR is fast because he has afib.  He takes antiplatelet Brillinta, but I do not see any record of afib in his medical record. No EKG performed.  Tachycardia thought to be pain related, but as I was unable to catch the patient prior to discharge, I called the patient's cell phone x2 and left a message advising him to return to the ER if he has any SOB,     Montine Circle, PA-C 12/16/17 0119    Duffy Bruce, MD 12/17/17 1931

## 2017-12-16 NOTE — ED Notes (Signed)
Patient verbalized understanding of discharge paperwork/rx/follow up instructions, vss, ambulatory with nad

## 2017-12-19 DIAGNOSIS — I5023 Acute on chronic systolic (congestive) heart failure: Secondary | ICD-10-CM | POA: Diagnosis not present

## 2017-12-19 DIAGNOSIS — N183 Chronic kidney disease, stage 3 (moderate): Secondary | ICD-10-CM | POA: Diagnosis not present

## 2017-12-19 DIAGNOSIS — I5022 Chronic systolic (congestive) heart failure: Secondary | ICD-10-CM | POA: Diagnosis not present

## 2017-12-19 DIAGNOSIS — I25110 Atherosclerotic heart disease of native coronary artery with unstable angina pectoris: Secondary | ICD-10-CM | POA: Diagnosis not present

## 2017-12-19 DIAGNOSIS — I130 Hypertensive heart and chronic kidney disease with heart failure and stage 1 through stage 4 chronic kidney disease, or unspecified chronic kidney disease: Secondary | ICD-10-CM | POA: Diagnosis not present

## 2017-12-19 DIAGNOSIS — I255 Ischemic cardiomyopathy: Secondary | ICD-10-CM | POA: Diagnosis not present

## 2017-12-19 DIAGNOSIS — Z452 Encounter for adjustment and management of vascular access device: Secondary | ICD-10-CM | POA: Diagnosis not present

## 2017-12-20 ENCOUNTER — Other Ambulatory Visit (HOSPITAL_COMMUNITY): Payer: Self-pay | Admitting: Internal Medicine

## 2017-12-22 DIAGNOSIS — N183 Chronic kidney disease, stage 3 (moderate): Secondary | ICD-10-CM | POA: Diagnosis not present

## 2017-12-22 DIAGNOSIS — Z955 Presence of coronary angioplasty implant and graft: Secondary | ICD-10-CM | POA: Diagnosis not present

## 2017-12-22 DIAGNOSIS — I5022 Chronic systolic (congestive) heart failure: Secondary | ICD-10-CM | POA: Diagnosis not present

## 2017-12-22 DIAGNOSIS — Z7901 Long term (current) use of anticoagulants: Secondary | ICD-10-CM | POA: Diagnosis not present

## 2017-12-22 DIAGNOSIS — Z5181 Encounter for therapeutic drug level monitoring: Secondary | ICD-10-CM | POA: Diagnosis not present

## 2017-12-22 DIAGNOSIS — I130 Hypertensive heart and chronic kidney disease with heart failure and stage 1 through stage 4 chronic kidney disease, or unspecified chronic kidney disease: Secondary | ICD-10-CM | POA: Diagnosis not present

## 2017-12-22 DIAGNOSIS — I25110 Atherosclerotic heart disease of native coronary artery with unstable angina pectoris: Secondary | ICD-10-CM | POA: Diagnosis not present

## 2017-12-22 DIAGNOSIS — Z79899 Other long term (current) drug therapy: Secondary | ICD-10-CM | POA: Diagnosis not present

## 2017-12-22 DIAGNOSIS — Z950 Presence of cardiac pacemaker: Secondary | ICD-10-CM | POA: Diagnosis not present

## 2017-12-22 DIAGNOSIS — E785 Hyperlipidemia, unspecified: Secondary | ICD-10-CM | POA: Diagnosis not present

## 2017-12-22 DIAGNOSIS — Z452 Encounter for adjustment and management of vascular access device: Secondary | ICD-10-CM | POA: Diagnosis not present

## 2017-12-22 DIAGNOSIS — Z7982 Long term (current) use of aspirin: Secondary | ICD-10-CM | POA: Diagnosis not present

## 2017-12-22 DIAGNOSIS — I255 Ischemic cardiomyopathy: Secondary | ICD-10-CM | POA: Diagnosis not present

## 2017-12-26 DIAGNOSIS — Z452 Encounter for adjustment and management of vascular access device: Secondary | ICD-10-CM | POA: Diagnosis not present

## 2017-12-26 DIAGNOSIS — I5022 Chronic systolic (congestive) heart failure: Secondary | ICD-10-CM | POA: Diagnosis not present

## 2017-12-26 DIAGNOSIS — I130 Hypertensive heart and chronic kidney disease with heart failure and stage 1 through stage 4 chronic kidney disease, or unspecified chronic kidney disease: Secondary | ICD-10-CM | POA: Diagnosis not present

## 2017-12-26 DIAGNOSIS — I255 Ischemic cardiomyopathy: Secondary | ICD-10-CM | POA: Diagnosis not present

## 2017-12-26 DIAGNOSIS — I25110 Atherosclerotic heart disease of native coronary artery with unstable angina pectoris: Secondary | ICD-10-CM | POA: Diagnosis not present

## 2017-12-26 DIAGNOSIS — N183 Chronic kidney disease, stage 3 (moderate): Secondary | ICD-10-CM | POA: Diagnosis not present

## 2017-12-27 ENCOUNTER — Other Ambulatory Visit: Payer: Self-pay | Admitting: Family Medicine

## 2017-12-28 ENCOUNTER — Other Ambulatory Visit (HOSPITAL_COMMUNITY): Payer: Self-pay | Admitting: Adult Health

## 2018-01-02 DIAGNOSIS — I25110 Atherosclerotic heart disease of native coronary artery with unstable angina pectoris: Secondary | ICD-10-CM | POA: Diagnosis not present

## 2018-01-02 DIAGNOSIS — I5022 Chronic systolic (congestive) heart failure: Secondary | ICD-10-CM | POA: Diagnosis not present

## 2018-01-02 DIAGNOSIS — Z452 Encounter for adjustment and management of vascular access device: Secondary | ICD-10-CM | POA: Diagnosis not present

## 2018-01-02 DIAGNOSIS — I255 Ischemic cardiomyopathy: Secondary | ICD-10-CM | POA: Diagnosis not present

## 2018-01-02 DIAGNOSIS — N183 Chronic kidney disease, stage 3 (moderate): Secondary | ICD-10-CM | POA: Diagnosis not present

## 2018-01-02 DIAGNOSIS — I130 Hypertensive heart and chronic kidney disease with heart failure and stage 1 through stage 4 chronic kidney disease, or unspecified chronic kidney disease: Secondary | ICD-10-CM | POA: Diagnosis not present

## 2018-01-04 ENCOUNTER — Telehealth (HOSPITAL_COMMUNITY): Payer: Self-pay | Admitting: Cardiology

## 2018-01-04 NOTE — Telephone Encounter (Signed)
Abnormal labs received from [facility] drawn 01/02/18  K 5.3 Cr 2.29 BUN 36 Na 139  Per Lillia [facility] repeat bmet x 1 week  Order sent to [facility] for one week recheck

## 2018-01-07 ENCOUNTER — Ambulatory Visit: Payer: Medicare Other | Attending: Family Medicine | Admitting: Family Medicine

## 2018-01-07 ENCOUNTER — Encounter: Payer: Self-pay | Admitting: Family Medicine

## 2018-01-07 VITALS — BP 145/78 | HR 85 | Temp 97.7°F | Ht 67.0 in | Wt 156.0 lb

## 2018-01-07 DIAGNOSIS — Z79899 Other long term (current) drug therapy: Secondary | ICD-10-CM | POA: Diagnosis not present

## 2018-01-07 DIAGNOSIS — Z95810 Presence of automatic (implantable) cardiac defibrillator: Secondary | ICD-10-CM | POA: Diagnosis not present

## 2018-01-07 DIAGNOSIS — Z7982 Long term (current) use of aspirin: Secondary | ICD-10-CM | POA: Insufficient documentation

## 2018-01-07 DIAGNOSIS — I130 Hypertensive heart and chronic kidney disease with heart failure and stage 1 through stage 4 chronic kidney disease, or unspecified chronic kidney disease: Secondary | ICD-10-CM | POA: Insufficient documentation

## 2018-01-07 DIAGNOSIS — I5042 Chronic combined systolic (congestive) and diastolic (congestive) heart failure: Secondary | ICD-10-CM | POA: Insufficient documentation

## 2018-01-07 DIAGNOSIS — E785 Hyperlipidemia, unspecified: Secondary | ICD-10-CM | POA: Diagnosis not present

## 2018-01-07 DIAGNOSIS — N183 Chronic kidney disease, stage 3 unspecified: Secondary | ICD-10-CM

## 2018-01-07 DIAGNOSIS — Z955 Presence of coronary angioplasty implant and graft: Secondary | ICD-10-CM | POA: Diagnosis not present

## 2018-01-07 DIAGNOSIS — M1A00X0 Idiopathic chronic gout, unspecified site, without tophus (tophi): Secondary | ICD-10-CM | POA: Diagnosis not present

## 2018-01-07 DIAGNOSIS — I255 Ischemic cardiomyopathy: Secondary | ICD-10-CM | POA: Insufficient documentation

## 2018-01-07 DIAGNOSIS — I252 Old myocardial infarction: Secondary | ICD-10-CM | POA: Diagnosis not present

## 2018-01-07 DIAGNOSIS — I110 Hypertensive heart disease with heart failure: Secondary | ICD-10-CM

## 2018-01-07 DIAGNOSIS — I2510 Atherosclerotic heart disease of native coronary artery without angina pectoris: Secondary | ICD-10-CM | POA: Insufficient documentation

## 2018-01-07 DIAGNOSIS — Z1211 Encounter for screening for malignant neoplasm of colon: Secondary | ICD-10-CM | POA: Diagnosis not present

## 2018-01-07 DIAGNOSIS — Z23 Encounter for immunization: Secondary | ICD-10-CM | POA: Diagnosis not present

## 2018-01-07 DIAGNOSIS — M109 Gout, unspecified: Secondary | ICD-10-CM | POA: Insufficient documentation

## 2018-01-07 MED ORDER — TETANUS-DIPHTH-ACELL PERTUSSIS 5-2.5-18.5 LF-MCG/0.5 IM SUSP: 0.5000 mL | Freq: Once | INTRAMUSCULAR | 0 refills | Status: AC

## 2018-01-07 MED ORDER — ALLOPURINOL 100 MG PO TABS: 100.0000 mg | ORAL_TABLET | Freq: Two times a day (BID) | ORAL | 6 refills | Status: DC

## 2018-01-07 MED FILL — BOOSTRIX VACCINE SYRINGE: 5-2.5-18.5 | 1 days supply | Qty: 1 | Fill #0

## 2018-01-07 NOTE — Patient Instructions (Signed)

## 2018-01-07 NOTE — Progress Notes (Signed)
Subjective:  Patient ID: Marvin Martin, male    DOB: Jan 15, 1966  Age: 52 y.o. MRN: [id number]  CC: Hypertension   HPI [name] is a 52 year old with medical history significant for CAD (s/p several MI and stents 17 with recurrent in-stent thrombosis), ICM, CHF (EF 15-20% from 03/2016) , HLD, and stage III chronic kidney disease, Gout history of right renal artery stent Who is here for a follow-up visit. He was last seen in the clinic 2 years ago but has been compliant with his follow-up visits at CHF clinic with his last visit 2 months ago with no change to his regimen and he remains on milrinone.  He has a nurse from advanced home care who assists with dressing change once a week. No transplant/advanced therapies due to lack of social support and no VAD due to CKD as per cardiology notes. He currently lives alone and his closest family is in [place]; he also has family in [place]. He denies shortness of breath, chest pains, pedal edema and has no orthopnea.  He has a good exercise tolerance. He denies recent gout flares but is needing refill of his allopurinol. Willing to receive the flu shot and Tdap today.  Also needs a colonoscopy.  Past Medical History:  Diagnosis Date  . Chronic systolic CHF (congestive heart failure) ([place])    a. 01/2015 Echo: EF 20-25%, antlat, inflat AK.  [name] CKD (chronic kidney disease), stage III ([place])   . Coronary artery disease    a. s/p MI x 4;  b. Reported h/o 17 stents with recurrent ISR;  c. 02/2014 Cath ([facility]): PCI to unknown vessel;  d. 01/2015 MV: EF 18%, large scar, no ischemia; e. 03/2015 PCI: LM nl, LAD nl, D2 patent stent, RI patent stent, LCX patent stents p/m, RCA 95p ISR (PTCA), 33m, 100d into RPL CTO, RPDA 90 (2.5x16 Synergy DES).  . Hyperlipidemia   . Hypertensive heart disease   . Ischemic cardiomyopathy    a. s/p SJM ICD 2007 w/ gen change in 2012; b. 12/2014 CPX Test: mild to mod HF limitation w/ pVO2 20.3 and nl slope;  c.  01/2015 Echo: EF 20-25%.    Past Surgical History:  Procedure Laterality Date  . 17 stints placements Bilateral   . CARDIAC CATHETERIZATION     Most recent 02/2014   . CARDIAC CATHETERIZATION N/A 03/31/2015   Procedure: Left Heart Cath and Coronary Angiography;  Surgeon: Troy Sine, MD;  Location: [facility];  Service: Cardiovascular;  Laterality: N/A;  . CARDIAC CATHETERIZATION N/A 03/31/2015   Procedure: Coronary Stent Intervention;  Surgeon: Troy Sine, MD;  Location: [facility];  Service: Cardiovascular;  Laterality: N/A;  . CARDIAC CATHETERIZATION N/A 05/27/2015   Procedure: Right/Left Heart Cath and Coronary Angiography;  Surgeon: Jolaine Artist, MD;  Location: [facility];  Service: Cardiovascular;  Laterality: N/A;  . CARDIAC CATHETERIZATION N/A 06/23/2015   Procedure: Right/Left Heart Cath and Coronary Angiography;  Surgeon: Jolaine Artist, MD;  Location: [facility];  Service: Cardiovascular;  Laterality: N/A;  . INSERT / REPLACE / REMOVE PACEMAKER     St jude 2006  Generator Change 2012   . PACEMAKER GENERATOR CHANGE Bilateral     No Known Allergies   Outpatient Medications Prior to Visit  Medication Sig Dispense Refill  . AMBULATORY NON FORMULARY MEDICATION Inject 0.125 mcg/kg/min into the vein continuous. Medication Name: Milrinone    . amLODipine (NORVASC) 10 MG  tablet TAKE 1 TABLET BY MOUTH EVERY DAY 30 tablet 3  . aspirin 81 MG tablet Take 81 mg by mouth daily.    [name] BRILINTA 90 MG TABS tablet TAKE 1 TABLET BY MOUTH TWICE A DAY 180 tablet 1  . chlorhexidine (PERIDEX) 0.12 % solution Use as directed 15 mLs in the mouth or throat 2 (two) times daily. 120 mL 0  . cholecalciferol (VITAMIN D) 1000 UNITS tablet Take 1,000 Units by mouth daily.    [name] 5 MG TABS tablet TAKE 1 TABLET (5 MG TOTAL) BY MOUTH 2 (TWO) TIMES DAILY WITH A MEAL. 60 tablet 3  . ENTRESTO 49-51 MG TAKE 1 TABLET BY MOUTH TWICE A DAY 60 tablet 3  . ezetimibe  (ZETIA) 10 MG tablet TAKE 1 TABLET BY MOUTH EVERY DAY 90 tablet 0  . HYDROcodone-acetaminophen (NORCO/VICODIN) 5-325 MG tablet Take 1 tablet by mouth every 4 (four) hours as needed. 10 tablet 0  . isosorbide mononitrate (IMDUR) 30 MG 24 hr tablet TAKE 1 TABLET (30 MG TOTAL) DAILY BY MOUTH. 90 tablet 3  . Magnesium Oxide 200 MG TABS Take 1 tablet (200 mg total) by mouth 2 (two) times daily. 180 tablet 3  . nitroGLYCERIN (NITROSTAT) 0.4 MG SL tablet Place 1 tablet (0.4 mg total) under the tongue every 5 (five) minutes as needed for chest pain. Reported on 04/12/2015 60 tablet 1  . omega-3 acid ethyl esters (LOVAZA) 1 G capsule Take 1 g by mouth daily.     . ondansetron (ZOFRAN) 4 MG tablet Take 1 tablet (4 mg total) every 8 (eight) hours as needed by mouth for nausea or vomiting. Reported on 05/24/2015 30 tablet 1  . penicillin v potassium (VEETID) 500 MG tablet Take 1 tablet (500 mg total) by mouth 4 (four) times daily. 40 tablet 0  . PRALUENT 75 MG/ML SOPN INJECT 75MG  (1 PEN) SUBCUTANEOUSLY EVERY 14 DAYS 2 pen 11  . rosuvastatin (CRESTOR) 40 MG tablet TAKE 1 TABLET BY MOUTH EVERY DAY 30 tablet 11  . allopurinol (ZYLOPRIM) 100 MG tablet Take 100 mg by mouth 2 (two) times daily.     . hydrALAZINE (APRESOLINE) 50 MG tablet Take 1 tablet (50 mg total) by mouth 3 (three) times daily. 90 tablet 3   No facility-administered medications prior to visit.     ROS Review of Systems  Constitutional: Negative for activity change and appetite change.  HENT: Negative for sinus pressure and sore throat.   Eyes: Negative for visual disturbance.  Respiratory: Negative for cough, chest tightness and shortness of breath.   Cardiovascular: Negative for chest pain and leg swelling.  Gastrointestinal: Negative for abdominal distention, abdominal pain, constipation and diarrhea.  Endocrine: Negative.   Genitourinary: Negative for dysuria.  Musculoskeletal: Negative for joint swelling and myalgias.  Skin: Negative for  rash.  Allergic/Immunologic: Negative.   Neurological: Negative for weakness, light-headedness and numbness.  Psychiatric/Behavioral: Negative for dysphoric mood and suicidal ideas.    Objective:  BP (!) 145/78   Pulse 85   Temp 97.7 F (36.5 C) (Oral)   Ht 5\' 7"  (1.702 m)   Wt 156 lb (70.8 kg)   SpO2 98%   BMI 24.43 kg/m   BP/Weight 01/07/2018 12/16/2017 70/0/1749  Systolic BP 449 675 916  Diastolic BP 78 99 94  Wt. (Lbs) 156 149.03 -  BMI 24.43 23 22.99      Physical Exam Constitutional:      Appearance: He is well-developed.  Cardiovascular:  Rate and Rhythm: Normal rate.     Heart sounds: Normal heart sounds. No murmur.  Pulmonary:     Effort: Pulmonary effort is normal.     Breath sounds: Normal breath sounds. No wheezing or rales.  Chest:     Chest wall: No tenderness.  Abdominal:     General: Bowel sounds are normal. There is no distension.     Palpations: Abdomen is soft. There is no mass.     Tenderness: There is no abdominal tenderness.  Musculoskeletal: Normal range of motion.  Skin:    Comments: Right arm PICC line in place  Neurological:     Mental Status: He is alert and oriented to person, place, and time.  Psychiatric:        Mood and Affect: Mood normal.        Behavior: Behavior normal.     CMP Latest Ref Rng & Units 08/21/2016 01/19/2016 01/05/2016  Glucose 65 - 99 mg/dL - 163(H) 103(H)  BUN 6 - 20 mg/dL - 27(H) 25(H)  Creatinine 0.61 - 1.24 mg/dL - 2.25(H) 2.05(H)  Sodium 135 - 145 mmol/L - 140 139  Potassium 3.5 - 5.1 mmol/L - 4.1 4.4  Chloride 101 - 111 mmol/L - 105 108  CO2 22 - 32 mmol/L - 26 22  Calcium 8.9 - 10.3 mg/dL - 9.5 9.5  Total Protein 6.0 - 8.5 g/dL 7.6 - -  Total Bilirubin 0.0 - 1.2 mg/dL 0.4 - -  Alkaline Phos 39 - 117 IU/L 73 - -  AST 0 - 40 IU/L 40 - -  ALT 0 - 44 IU/L 57(H) - -     Assessment & Plan:   1. Hypertensive heart disease with chronic combined systolic and diastolic congestive heart failure  (HCC) EF 15 to 20% from echo on 03/2016 S/p ICD NYHA II, currently on milrinone Not a candidate for transplant/advanced therapy due to lack of social support, no VAD due to CKD as per cardiology. Blood pressure is slightly elevated-no regimen change today but will reassess at next visit Continue current medications Keep appointment with CHF clinic  2. CKD (chronic kidney disease) stage 3, GFR 30-59 ml/min (HCC) Last creatinine was 2.25 Has an upcoming appointment with cardiology in 2 weeks Avoid nephrotoxins  3. Screening for colon cancer - Ambulatory referral to Gastroenterology  4. Idiopathic chronic gout without tophus, unspecified site Acute flare Continue allopurinol  5. Need for Tdap vaccination Tdap administered   Meds ordered this encounter  Medications  . allopurinol (ZYLOPRIM) 100 MG tablet    Sig: Take 1 tablet (100 mg total) by mouth 2 (two) times daily.    Dispense:  60 tablet    Refill:  6  . Tdap (BOOSTRIX) 5-2.5-18.5 LF-MCG/0.5 injection    Sig: Inject 0.5 mLs into the muscle once for 1 dose.    Dispense:  0.5 mL    Refill:  0    Follow-up: Return in about 6 months (around 07/09/2018) for Follow-up of chronic medical conditions.   Charlott Rakes MD

## 2018-01-09 DIAGNOSIS — I130 Hypertensive heart and chronic kidney disease with heart failure and stage 1 through stage 4 chronic kidney disease, or unspecified chronic kidney disease: Secondary | ICD-10-CM | POA: Diagnosis not present

## 2018-01-09 DIAGNOSIS — N183 Chronic kidney disease, stage 3 (moderate): Secondary | ICD-10-CM | POA: Diagnosis not present

## 2018-01-09 DIAGNOSIS — I255 Ischemic cardiomyopathy: Secondary | ICD-10-CM | POA: Diagnosis not present

## 2018-01-09 DIAGNOSIS — I25110 Atherosclerotic heart disease of native coronary artery with unstable angina pectoris: Secondary | ICD-10-CM | POA: Diagnosis not present

## 2018-01-09 DIAGNOSIS — I5022 Chronic systolic (congestive) heart failure: Secondary | ICD-10-CM | POA: Diagnosis not present

## 2018-01-09 DIAGNOSIS — Z452 Encounter for adjustment and management of vascular access device: Secondary | ICD-10-CM | POA: Diagnosis not present

## 2018-01-10 ENCOUNTER — Other Ambulatory Visit (HOSPITAL_COMMUNITY): Payer: Self-pay | Admitting: Internal Medicine

## 2018-01-11 DIAGNOSIS — M7989 Other specified soft tissue disorders: Secondary | ICD-10-CM | POA: Diagnosis not present

## 2018-01-14 ENCOUNTER — Other Ambulatory Visit: Payer: Self-pay | Admitting: Internal Medicine

## 2018-01-14 DIAGNOSIS — N2581 Secondary hyperparathyroidism of renal origin: Secondary | ICD-10-CM | POA: Diagnosis not present

## 2018-01-14 DIAGNOSIS — N183 Chronic kidney disease, stage 3 (moderate): Secondary | ICD-10-CM | POA: Diagnosis not present

## 2018-01-16 DIAGNOSIS — I255 Ischemic cardiomyopathy: Secondary | ICD-10-CM | POA: Diagnosis not present

## 2018-01-16 DIAGNOSIS — I25110 Atherosclerotic heart disease of native coronary artery with unstable angina pectoris: Secondary | ICD-10-CM | POA: Diagnosis not present

## 2018-01-16 DIAGNOSIS — I130 Hypertensive heart and chronic kidney disease with heart failure and stage 1 through stage 4 chronic kidney disease, or unspecified chronic kidney disease: Secondary | ICD-10-CM | POA: Diagnosis not present

## 2018-01-16 DIAGNOSIS — I5022 Chronic systolic (congestive) heart failure: Secondary | ICD-10-CM | POA: Diagnosis not present

## 2018-01-16 DIAGNOSIS — N183 Chronic kidney disease, stage 3 (moderate): Secondary | ICD-10-CM | POA: Diagnosis not present

## 2018-01-16 DIAGNOSIS — Z452 Encounter for adjustment and management of vascular access device: Secondary | ICD-10-CM | POA: Diagnosis not present

## 2018-01-22 ENCOUNTER — Other Ambulatory Visit (HOSPITAL_COMMUNITY): Payer: Self-pay | Admitting: Internal Medicine

## 2018-01-22 ENCOUNTER — Other Ambulatory Visit: Payer: Self-pay | Admitting: Internal Medicine

## 2018-01-22 DIAGNOSIS — N183 Chronic kidney disease, stage 3 (moderate): Secondary | ICD-10-CM | POA: Diagnosis not present

## 2018-01-22 DIAGNOSIS — E785 Hyperlipidemia, unspecified: Secondary | ICD-10-CM | POA: Diagnosis not present

## 2018-01-22 DIAGNOSIS — N2581 Secondary hyperparathyroidism of renal origin: Secondary | ICD-10-CM | POA: Diagnosis not present

## 2018-01-22 DIAGNOSIS — I129 Hypertensive chronic kidney disease with stage 1 through stage 4 chronic kidney disease, or unspecified chronic kidney disease: Secondary | ICD-10-CM | POA: Diagnosis not present

## 2018-01-22 DIAGNOSIS — I509 Heart failure, unspecified: Secondary | ICD-10-CM | POA: Diagnosis not present

## 2018-01-23 DIAGNOSIS — Z452 Encounter for adjustment and management of vascular access device: Secondary | ICD-10-CM | POA: Diagnosis not present

## 2018-01-23 DIAGNOSIS — I255 Ischemic cardiomyopathy: Secondary | ICD-10-CM | POA: Diagnosis not present

## 2018-01-23 DIAGNOSIS — I5022 Chronic systolic (congestive) heart failure: Secondary | ICD-10-CM | POA: Diagnosis not present

## 2018-01-23 DIAGNOSIS — N183 Chronic kidney disease, stage 3 (moderate): Secondary | ICD-10-CM | POA: Diagnosis not present

## 2018-01-23 DIAGNOSIS — I130 Hypertensive heart and chronic kidney disease with heart failure and stage 1 through stage 4 chronic kidney disease, or unspecified chronic kidney disease: Secondary | ICD-10-CM | POA: Diagnosis not present

## 2018-01-23 DIAGNOSIS — I25110 Atherosclerotic heart disease of native coronary artery with unstable angina pectoris: Secondary | ICD-10-CM | POA: Diagnosis not present

## 2018-01-24 ENCOUNTER — Ambulatory Visit (INDEPENDENT_AMBULATORY_CARE_PROVIDER_SITE_OTHER): Payer: Medicare Other

## 2018-01-24 DIAGNOSIS — I255 Ischemic cardiomyopathy: Secondary | ICD-10-CM

## 2018-01-24 DIAGNOSIS — I5022 Chronic systolic (congestive) heart failure: Secondary | ICD-10-CM

## 2018-01-25 NOTE — Progress Notes (Signed)
Remote ICD transmission.   

## 2018-01-26 LAB — CUP PACEART REMOTE DEVICE CHECK
Battery Remaining Longevity: 16 mo
Battery Voltage: 2.77 V
Brady Statistic AP VP Percent: 1 %
Brady Statistic AP VS Percent: 1.5 %
Brady Statistic AS VP Percent: 1 %
Brady Statistic AS VS Percent: 95 %
Brady Statistic RA Percent Paced: 1 %
Brady Statistic RV Percent Paced: 1 %
Date Time Interrogation Session: 20200109070017
HighPow Impedance: 97 Ohm
Implantable Lead Implant Date: 20121107
Implantable Lead Implant Date: 20121107
Implantable Lead Location: 753859
Implantable Lead Location: 753860
Implantable Lead Model: 7000
Implantable Pulse Generator Implant Date: 20121107
Lead Channel Impedance Value: 300 Ohm
Lead Channel Pacing Threshold Amplitude: 0.75 V
Lead Channel Pacing Threshold Amplitude: 1.25 V
Lead Channel Pacing Threshold Pulse Width: 0.7 ms
Lead Channel Sensing Intrinsic Amplitude: 11.5 mV
Lead Channel Sensing Intrinsic Amplitude: 3 mV
Lead Channel Setting Pacing Amplitude: 2 V
Lead Channel Setting Pacing Amplitude: 2.5 V
Lead Channel Setting Pacing Pulse Width: 0.7 ms
Lead Channel Setting Sensing Sensitivity: 0.5 mV
MDC IDC MSMT BATTERY REMAINING PERCENTAGE: 19 %
MDC IDC MSMT LEADCHNL RA PACING THRESHOLD PULSEWIDTH: 0.5 ms
MDC IDC MSMT LEADCHNL RV IMPEDANCE VALUE: 550 Ohm
Pulse Gen Serial Number: 1033795

## 2018-01-28 ENCOUNTER — Encounter: Payer: Self-pay | Admitting: Gastroenterology

## 2018-01-30 DIAGNOSIS — N183 Chronic kidney disease, stage 3 (moderate): Secondary | ICD-10-CM | POA: Diagnosis not present

## 2018-01-30 DIAGNOSIS — I5022 Chronic systolic (congestive) heart failure: Secondary | ICD-10-CM | POA: Diagnosis not present

## 2018-01-30 DIAGNOSIS — I255 Ischemic cardiomyopathy: Secondary | ICD-10-CM | POA: Diagnosis not present

## 2018-01-30 DIAGNOSIS — I25110 Atherosclerotic heart disease of native coronary artery with unstable angina pectoris: Secondary | ICD-10-CM | POA: Diagnosis not present

## 2018-01-30 DIAGNOSIS — I130 Hypertensive heart and chronic kidney disease with heart failure and stage 1 through stage 4 chronic kidney disease, or unspecified chronic kidney disease: Secondary | ICD-10-CM | POA: Diagnosis not present

## 2018-01-30 DIAGNOSIS — Z452 Encounter for adjustment and management of vascular access device: Secondary | ICD-10-CM | POA: Diagnosis not present

## 2018-02-01 ENCOUNTER — Other Ambulatory Visit (HOSPITAL_COMMUNITY): Payer: Self-pay | Admitting: Internal Medicine

## 2018-02-01 ENCOUNTER — Ambulatory Visit (INDEPENDENT_AMBULATORY_CARE_PROVIDER_SITE_OTHER): Payer: Medicare Other | Admitting: Internal Medicine

## 2018-02-01 ENCOUNTER — Telehealth: Payer: Self-pay

## 2018-02-01 ENCOUNTER — Encounter: Payer: Self-pay | Admitting: Internal Medicine

## 2018-02-01 VITALS — BP 142/86 | HR 95 | Ht 67.0 in | Wt 156.8 lb

## 2018-02-01 DIAGNOSIS — I5042 Chronic combined systolic (congestive) and diastolic (congestive) heart failure: Secondary | ICD-10-CM | POA: Diagnosis not present

## 2018-02-01 DIAGNOSIS — I25118 Atherosclerotic heart disease of native coronary artery with other forms of angina pectoris: Secondary | ICD-10-CM | POA: Diagnosis not present

## 2018-02-01 DIAGNOSIS — Z95810 Presence of automatic (implantable) cardiac defibrillator: Secondary | ICD-10-CM | POA: Diagnosis not present

## 2018-02-01 DIAGNOSIS — I10 Essential (primary) hypertension: Secondary | ICD-10-CM

## 2018-02-01 DIAGNOSIS — I255 Ischemic cardiomyopathy: Secondary | ICD-10-CM

## 2018-02-01 MED ORDER — ALIROCUMAB 75 MG/ML ~~LOC~~ SOAJ: 75.0000 mg | SUBCUTANEOUS | 11 refills | Status: DC

## 2018-02-01 NOTE — Telephone Encounter (Signed)
Patient is here for office visit today with Dr. Caryl Comes and states that he needs his Praluent refilled through [facility]. He states that he has not received any pen injectors this year. Please refill to [facility].

## 2018-02-01 NOTE — Telephone Encounter (Signed)
RX refill sent as requested.  

## 2018-02-01 NOTE — Patient Instructions (Signed)

## 2018-02-01 NOTE — Progress Notes (Signed)
Patient Care Team: Charlott Rakes, MD as PCP - General (Family Medicine)   HPI  Marvin Martin is a 53 y.o. male seen in follow-up for an ICD implanted 2007 for primary prevention for ischemic heart disease  Hx prior MI 4 and stents 17.  .  Generator replacement 2012. He has however had 4 shocks 3 on his initial device and one on this device.   He is unaware as to whether they were appropriate or inappropriate. He does recognize the term ventricular fibrillation as it applies to him.  Heart failure notes were reviewed.  He remains on milrinone.     The patient denies chest pain, shortness of breath, nocturnal dyspnea, orthopnea or peripheral edema.  There have been no palpitations, lightheadedness or syncope.    DOE on 2 flightsd  DATE TEST    1/17 Echo    EF 20-25 %   6/17 LHC  LAD m 70; RCAp 70 RCAd 100   3/18 Echo    EF 15-20 %          Date Cr K  8/17 2.6 4.6  1/18 2.25 4.1  12/19 1.96 4.5    Records and Results Reviewed   Past Medical History:  Diagnosis Date  . Chronic systolic CHF (congestive heart failure) ([place])    a. 01/2015 Echo: EF 20-25%, antlat, inflat AK.  [name] CKD (chronic kidney disease), stage III ([place])   . Coronary artery disease    a. s/p MI x 4;  b. Reported h/o 17 stents with recurrent ISR;  c. 02/2014 Cath ([place]): PCI to unknown vessel;  d. 01/2015 MV: EF 18%, large scar, no ischemia; e. 03/2015 PCI: LM nl, LAD nl, D2 patent stent, RI patent stent, LCX patent stents p/m, RCA 95p ISR (PTCA), 26m, 100d into RPL CTO, RPDA 90 (2.5x16 Synergy DES).  . Hyperlipidemia   . Hypertensive heart disease   . Ischemic cardiomyopathy    a. s/p SJM ICD 2007 w/ gen change in 2012; b. 12/2014 CPX Test: mild to mod HF limitation w/ pVO2 20.3 and nl slope;  c. 01/2015 Echo: EF 20-25%.    Past Surgical History:  Procedure Laterality Date  . 17 stints placements Bilateral   . CARDIAC CATHETERIZATION     Most recent 02/2014   . CARDIAC CATHETERIZATION N/A  03/31/2015   Procedure: Left Heart Cath and Coronary Angiography;  Surgeon: Troy Sine, MD;  Location: [facility];  Service: Cardiovascular;  Laterality: N/A;  . CARDIAC CATHETERIZATION N/A 03/31/2015   Procedure: Coronary Stent Intervention;  Surgeon: Troy Sine, MD;  Location: [facility];  Service: Cardiovascular;  Laterality: N/A;  . CARDIAC CATHETERIZATION N/A 05/27/2015   Procedure: Right/Left Heart Cath and Coronary Angiography;  Surgeon: Jolaine Artist, MD;  Location: [facility];  Service: Cardiovascular;  Laterality: N/A;  . CARDIAC CATHETERIZATION N/A 06/23/2015   Procedure: Right/Left Heart Cath and Coronary Angiography;  Surgeon: Jolaine Artist, MD;  Location: [facility];  Service: Cardiovascular;  Laterality: N/A;  . INSERT / REPLACE / REMOVE PACEMAKER     St jude 2006  Generator Change 2012   . PACEMAKER GENERATOR CHANGE Bilateral     Current Outpatient Medications  Medication Sig Dispense Refill  . allopurinol (ZYLOPRIM) 100 MG tablet Take 1 tablet (100 mg total) by mouth 2 (two) times daily. 60 tablet 6  . AMBULATORY NON FORMULARY MEDICATION Inject 0.125 mcg/kg/min into the vein continuous. Medication  Name: Milrinone    . amLODipine (NORVASC) 10 MG tablet TAKE 1 TABLET BY MOUTH EVERY DAY 30 tablet 3  . aspirin 81 MG tablet Take 81 mg by mouth daily.    [name] BRILINTA 90 MG TABS tablet TAKE 1 TABLET BY MOUTH TWICE A DAY 180 tablet 1  . chlorhexidine (PERIDEX) 0.12 % solution Use as directed 15 mLs in the mouth or throat 2 (two) times daily. 120 mL 0  . cholecalciferol (VITAMIN D) 1000 UNITS tablet Take 1,000 Units by mouth daily.    [name] 5 MG TABS tablet TAKE 1 TABLET (5 MG TOTAL) BY MOUTH 2 (TWO) TIMES DAILY WITH A MEAL. 60 tablet 3  . ENTRESTO 49-51 MG TAKE 1 TABLET BY MOUTH TWICE A DAY 60 tablet 3  . ezetimibe (ZETIA) 10 MG tablet TAKE 1 TABLET BY MOUTH EVERY DAY 90 tablet 0  . hydrALAZINE (APRESOLINE) 50 MG tablet Take 1 tablet  (50 mg total) by mouth 3 (three) times daily. 90 tablet 3  . HYDROcodone-acetaminophen (NORCO/VICODIN) 5-325 MG tablet Take 1 tablet by mouth every 4 (four) hours as needed. 10 tablet 0  . isosorbide mononitrate (IMDUR) 30 MG 24 hr tablet TAKE 1 TABLET (30 MG TOTAL) DAILY BY MOUTH. 90 tablet 3  . Magnesium Oxide 200 MG TABS Take 1 tablet (200 mg total) by mouth 2 (two) times daily. 180 tablet 3  . nitroGLYCERIN (NITROSTAT) 0.4 MG SL tablet Place 1 tablet (0.4 mg total) under the tongue every 5 (five) minutes as needed for chest pain. Reported on 04/12/2015 60 tablet 1  . omega-3 acid ethyl esters (LOVAZA) 1 G capsule Take 1 g by mouth daily.     . ondansetron (ZOFRAN) 4 MG tablet Take 1 tablet (4 mg total) every 8 (eight) hours as needed by mouth for nausea or vomiting. Reported on 05/24/2015 30 tablet 1  . penicillin v potassium (VEETID) 500 MG tablet Take 1 tablet (500 mg total) by mouth 4 (four) times daily. 40 tablet 0  . PRALUENT 75 MG/ML SOPN INJECT 75MG  (1 PEN) SUBCUTANEOUSLY EVERY 14 DAYS 2 pen 11  . rosuvastatin (CRESTOR) 40 MG tablet TAKE 1 TABLET BY MOUTH EVERY DAY 30 tablet 11   No current facility-administered medications for this visit.     No Known Allergies    Review of Systems negative except from HPI and PMH  Physical Exam BP (!) 142/86   Pulse 95   Ht 5\' 7"  (1.702 m)   Wt 156 lb 12.8 oz (71.1 kg)   SpO2 96%   BMI 24.56 kg/m  Well developed and nourished in no acute distress HENT normal Neck supple with JVP-7-8 Clear Regular rate and rhythm, no murmurs or gallops Abd-soft with active BS Pump in place No Clubbing cyanosis edema Skin-warm and dry A & Oriented  Grossly normal sensory and motor function   ECG demonstrates sinus @ 95 17/12/39 PVC freq  Assessment and  Plan  Ischemic cardiomyopathy  Implantable defibrillator-[employer]. [employer]  Renal insufficiency grade 3-4  Ventricular tachycardia-nonsustained   Without symptoms of ischemia  Euvolemic continue  current meds  No intercurrent Ventricular tachycardia  Freq PVCs  Reviewed sexual relations with an ICD in place

## 2018-02-06 DIAGNOSIS — I25110 Atherosclerotic heart disease of native coronary artery with unstable angina pectoris: Secondary | ICD-10-CM | POA: Diagnosis not present

## 2018-02-06 DIAGNOSIS — I130 Hypertensive heart and chronic kidney disease with heart failure and stage 1 through stage 4 chronic kidney disease, or unspecified chronic kidney disease: Secondary | ICD-10-CM | POA: Diagnosis not present

## 2018-02-06 DIAGNOSIS — N183 Chronic kidney disease, stage 3 (moderate): Secondary | ICD-10-CM | POA: Diagnosis not present

## 2018-02-06 DIAGNOSIS — I255 Ischemic cardiomyopathy: Secondary | ICD-10-CM | POA: Diagnosis not present

## 2018-02-06 DIAGNOSIS — Z452 Encounter for adjustment and management of vascular access device: Secondary | ICD-10-CM | POA: Diagnosis not present

## 2018-02-06 DIAGNOSIS — I5022 Chronic systolic (congestive) heart failure: Secondary | ICD-10-CM | POA: Diagnosis not present

## 2018-02-08 ENCOUNTER — Ambulatory Visit (INDEPENDENT_AMBULATORY_CARE_PROVIDER_SITE_OTHER): Payer: Medicare Other | Admitting: Physician Assistant

## 2018-02-08 ENCOUNTER — Encounter: Payer: Self-pay | Admitting: Physician Assistant

## 2018-02-08 VITALS — BP 142/90 | HR 78 | Ht 67.5 in | Wt 159.4 lb

## 2018-02-08 DIAGNOSIS — Z1211 Encounter for screening for malignant neoplasm of colon: Secondary | ICD-10-CM

## 2018-02-08 DIAGNOSIS — Z1212 Encounter for screening for malignant neoplasm of rectum: Secondary | ICD-10-CM | POA: Diagnosis not present

## 2018-02-08 DIAGNOSIS — Z01818 Encounter for other preprocedural examination: Secondary | ICD-10-CM | POA: Diagnosis not present

## 2018-02-08 DIAGNOSIS — Z7901 Long term (current) use of anticoagulants: Secondary | ICD-10-CM

## 2018-02-08 DIAGNOSIS — N183 Chronic kidney disease, stage 3 unspecified: Secondary | ICD-10-CM

## 2018-02-08 DIAGNOSIS — I255 Ischemic cardiomyopathy: Secondary | ICD-10-CM | POA: Diagnosis not present

## 2018-02-08 NOTE — Patient Instructions (Addendum)
If you are age 53 or older, your body mass index should be between 23-30. Your Body mass index is 24.59 kg/m. If this is out of the aforementioned range listed, please consider follow up with your Primary Care Provider.  If you are age 4 or younger, your body mass index should be between 19-25. Your Body mass index is 24.59 kg/m. If this is out of the aformentioned range listed, please consider follow up with your Primary Care Provider.   Your provider has ordered Cologuard testing as an option for colon cancer screening. This is performed by [employer] and may be out of network with your insurance. PRIOR to completing the test, it is YOUR responsibility to contact your insurance about covered benefits for this test. Your out of pocket expense could be anywhere from $0.00 to $649.00.   When you call to check coverage with your insurer, please provide the following information:   -The ONLY provider of Cologuard is [place] code for Cologuard is [phone].  [employer] NPI # [id number]  -[employer] Tax ID # [id number]   We have already sent your demographic and insurance information to [employer] (phone number [phone]) and they should contact you within the next week regarding your test. If you have not heard from them within the next week, please call our office at [phone].  Follow up with Dr. Rush Landmark as needed.  Thank you for choosing me and [facility].    Ellouise Newer, PA-C

## 2018-02-08 NOTE — Progress Notes (Signed)
Agree with assessment and plan per PA Lemmon. Patient with multiple medical comorbidities in the setting of coronary artery disease and antiplatelet therapy with Brilinta.  It is felt that the patient by his primary care provider and other providers is a candidate for potential colon cancer screening and thus the most reasonable step for this patient as a noninvasive test without coming off prolonged or putting him through anesthesia first would be a Cologuard.  If the patient's Cologuard returns positive then he will need a diagnostic colonoscopy in the hospital-based setting but that would only be performed after a repeat echocardiogram shows any clinical changes from his prior in 2018.

## 2018-02-08 NOTE — Progress Notes (Signed)
Chief Complaint: Preprocedural visit for a screening colonoscopy in a patient on chronic anticoagulation  HPI:    Marvin Martin is a 53 year old African-American male with a past medical history as listed below including CKD stage III, ischemic cardiomyopathy (echo 03/20/2016 with an LVEF 15-20%) status post pacemaker placement, 4 MIs and CAD status post 17 stents on Brilinta, who was referred to me by Charlott Rakes, MD for a preprocedural visit for screening colonoscopy.      Today, the patient presents clinic and explains that he has no GI complaints.  He was told that he needed screening for colon cancer.  Denies a family history of colon cancer or polyps.    Social history positive for moving to [place] from [place] because it was thought the stress of living in [place] was adding to his frequent MIs per his docs.  He has since retired and is living down here.  Medical history positive for having an episode of hematuria last week.  He is following with his PCP about this.    Denies fever, chills, weight loss, Nexium, nausea, vomiting, heartburn, reflux, abdominal pain or change in bowel habits.  Past Medical History:  Diagnosis Date  . Chronic systolic CHF (congestive heart failure) ([place])    a. 01/2015 Echo: EF 20-25%, antlat, inflat AK.  [name] CKD (chronic kidney disease), stage III ([place])   . Coronary artery disease    a. s/p MI x 4;  b. Reported h/o 17 stents with recurrent ISR;  c. 02/2014 Cath ([place]): PCI to unknown vessel;  d. 01/2015 MV: EF 18%, large scar, no ischemia; e. 03/2015 PCI: LM nl, LAD nl, D2 patent stent, RI patent stent, LCX patent stents p/m, RCA 95p ISR (PTCA), 27m, 100d into RPL CTO, RPDA 90 (2.5x16 Synergy DES).  . Hyperlipidemia   . Hypertensive heart disease   . Ischemic cardiomyopathy    a. s/p SJM ICD 2007 w/ gen change in 2012; b. 12/2014 CPX Test: mild to mod HF limitation w/ pVO2 20.3 and nl slope;  c. 01/2015 Echo: EF 20-25%.    Past Surgical History:  Procedure  Laterality Date  . 17 stints placements Bilateral   . CARDIAC CATHETERIZATION     Most recent 02/2014   . CARDIAC CATHETERIZATION N/A 03/31/2015   Procedure: Left Heart Cath and Coronary Angiography;  Surgeon: Troy Sine, MD;  Location: [facility];  Service: Cardiovascular;  Laterality: N/A;  . CARDIAC CATHETERIZATION N/A 03/31/2015   Procedure: Coronary Stent Intervention;  Surgeon: Troy Sine, MD;  Location: [facility];  Service: Cardiovascular;  Laterality: N/A;  . CARDIAC CATHETERIZATION N/A 05/27/2015   Procedure: Right/Left Heart Cath and Coronary Angiography;  Surgeon: Jolaine Artist, MD;  Location: [facility];  Service: Cardiovascular;  Laterality: N/A;  . CARDIAC CATHETERIZATION N/A 06/23/2015   Procedure: Right/Left Heart Cath and Coronary Angiography;  Surgeon: Jolaine Artist, MD;  Location: [facility];  Service: Cardiovascular;  Laterality: N/A;  . INSERT / REPLACE / REMOVE PACEMAKER     St jude 2006  Generator Change 2012   . PACEMAKER GENERATOR CHANGE Bilateral     Current Outpatient Medications  Medication Sig Dispense Refill  . allopurinol (ZYLOPRIM) 100 MG tablet Take 1 tablet (100 mg total) by mouth 2 (two) times daily. 60 tablet 6  . amLODipine (NORVASC) 10 MG tablet TAKE 1 TABLET BY MOUTH EVERY DAY 30 tablet 3  . aspirin 81 MG tablet Take  81 mg by mouth daily.    [name] BRILINTA 90 MG TABS tablet TAKE 1 TABLET BY MOUTH TWICE A DAY 180 tablet 1  . cholecalciferol (VITAMIN D) 1000 UNITS tablet Take 1,000 Units by mouth daily.    [name] 5 MG TABS tablet TAKE 1 TABLET (5 MG TOTAL) BY MOUTH 2 (TWO) TIMES DAILY WITH A MEAL. 60 tablet 3  . ENTRESTO 49-51 MG TAKE 1 TABLET BY MOUTH TWICE A DAY 60 tablet 3  . ezetimibe (ZETIA) 10 MG tablet TAKE 1 TABLET BY MOUTH EVERY DAY 90 tablet 0  . hydrALAZINE (APRESOLINE) 50 MG tablet Take 1 tablet (50 mg total) by mouth 3 (three) times daily. 90 tablet 3  . HYDROcodone-acetaminophen (NORCO/VICODIN)  5-325 MG tablet Take 1 tablet by mouth every 4 (four) hours as needed. 10 tablet 0  . isosorbide mononitrate (IMDUR) 30 MG 24 hr tablet TAKE 1 TABLET (30 MG TOTAL) DAILY BY MOUTH. 90 tablet 3  . Magnesium Oxide 200 MG TABS Take 1 tablet (200 mg total) by mouth 2 (two) times daily. 180 tablet 3  . nitroGLYCERIN (NITROSTAT) 0.4 MG SL tablet Place 1 tablet (0.4 mg total) under the tongue every 5 (five) minutes as needed for chest pain. Reported on 04/12/2015 60 tablet 1  . omega-3 acid ethyl esters (LOVAZA) 1 G capsule Take 1 g by mouth daily.     . ondansetron (ZOFRAN) 4 MG tablet Take 1 tablet (4 mg total) every 8 (eight) hours as needed by mouth for nausea or vomiting. Reported on 05/24/2015 30 tablet 1  . rosuvastatin (CRESTOR) 40 MG tablet TAKE 1 TABLET BY MOUTH EVERY DAY 30 tablet 11   No current facility-administered medications for this visit.     Allergies as of 02/08/2018  . (No Known Allergies)    Family History  Problem Relation Age of Onset  . Cancer Mother        died when pt was 30.  She had a h/o heart dzs as well.  . Hypertension Mother   . Hyperlipidemia Father        Father died when pt was age 46 - he believes that he had a cardiac hx.  [name] CAD Sister     Social History   Socioeconomic History  . Marital status: Legally Separated    Spouse name: Not on file  . Number of children: Not on file  . Years of education: Not on file  . Highest education level: Not on file  Occupational History  . Not on file  Social Needs  . Financial resource strain: Not on file  . Food insecurity:    Worry: Not on file    Inability: Not on file  . Transportation needs:    Medical: Not on file    Non-medical: Not on file  Tobacco Use  . Smoking status: Never Smoker  . Smokeless tobacco: Never Used  Substance and Sexual Activity  . Alcohol use: No    Alcohol/week: 0.0 standard drinks  . Drug use: No  . Sexual activity: Never  Lifestyle  . Physical activity:    Days per week:  Not on file    Minutes per session: Not on file  . Stress: Not on file  Relationships  . Social connections:    Talks on phone: Not on file    Gets together: Not on file    Attends religious service: Not on file    Active member of club or organization: Not on file  Attends meetings of clubs or organizations: Not on file    Relationship status: Not on file  . Intimate partner violence:    Fear of current or ex partner: Not on file    Emotionally abused: Not on file    Physically abused: Not on file    Forced sexual activity: Not on file  Other Topics Concern  . Not on file  Social History Narrative   Moved to [place] from [place] in 2016.  Lives alone in [place].    Review of Systems:    Constitutional: No weight loss, fever or chills Skin: No rash  Cardiovascular: No chest pain  Respiratory: No SOB  Gastrointestinal: See HPI and otherwise negative Genitourinary: No dysuria  Neurological: No headache, dizziness or syncope Musculoskeletal: No new muscle or joint pain Hematologic: No bruising Psychiatric: No history of depression or anxiety   Physical Exam:  Vital signs: BP (!) 142/90   Pulse 78   Ht 5' 7.5" (1.715 m)   Wt 159 lb 6 oz (72.3 kg)   BMI 24.59 kg/m   Constitutional:   Very Pleasant AA male appears to be in NAD, Well developed, Well nourished, alert and cooperative Head:  Normocephalic and atraumatic. Eyes:   PEERL, EOMI. No icterus. Conjunctiva pink. Ears:  Normal auditory acuity. Neck:  Supple Throat: Oral cavity and pharynx without inflammation, swelling or lesion.  Respiratory: Respirations even and unlabored. Lungs clear to auscultation bilaterally.   No wheezes, crackles, or rhonchi.  Cardiovascular: Normal S1, S2. No MRG. Regular rate and rhythm. No peripheral edema, cyanosis or pallor.  Gastrointestinal:  Soft, nondistended, nontender. No rebound or guarding. Normal bowel sounds. No appreciable masses or hepatomegaly. Rectal:  Not performed.  Msk:   Symmetrical without gross deformities. Without edema, no deformity or joint abnormality.  Neurologic:  Alert and  oriented x4;  grossly normal neurologically.  Skin:   Dry and intact without significant lesions or rashes. Psychiatric: Demonstrates good judgement and reason without abnormal affect or behaviors.  No recent labs.  Assessment: 1.  Screening for colorectal cancer: Patient has never had screening for colorectal cancer and is now 4, no GI complaints 2.  Chronic anticoagulation: With Brilinta, status post 17 stents placed, the last appears to be in 2017 3.  Ischemic cardiomyopathy: EF of 15% when last checked in 2018, patient does have a repeat echo in February 4.  CKD stage III  Plan: 1.  Due to patient's extensive cardiac history including 17 prior stents, ischemic cardiomyopathy with an EF of 15% and need for chronic anticoagulation with Brilinta, will proceed with a Cologuard test as a first step to colon cancer screening.  If this is negative patient will just need to repeat every 3 years.  If this is positive we will discuss hospital colonoscopy. 2.  Discussed above with the patient.  Answered all his questions. 3.  Patient to follow in clinic as directed after testing received above.  He was assigned to Dr. Rush Landmark today.  Ellouise Newer, PA-C [facility] 02/08/2018, 8:56 AM  Cc: Charlott Rakes, MD

## 2018-02-13 ENCOUNTER — Telehealth (HOSPITAL_COMMUNITY): Payer: Self-pay | Admitting: Cardiology

## 2018-02-13 DIAGNOSIS — I25110 Atherosclerotic heart disease of native coronary artery with unstable angina pectoris: Secondary | ICD-10-CM | POA: Diagnosis not present

## 2018-02-13 DIAGNOSIS — I255 Ischemic cardiomyopathy: Secondary | ICD-10-CM | POA: Diagnosis not present

## 2018-02-13 DIAGNOSIS — I130 Hypertensive heart and chronic kidney disease with heart failure and stage 1 through stage 4 chronic kidney disease, or unspecified chronic kidney disease: Secondary | ICD-10-CM | POA: Diagnosis not present

## 2018-02-13 DIAGNOSIS — I5022 Chronic systolic (congestive) heart failure: Secondary | ICD-10-CM | POA: Diagnosis not present

## 2018-02-13 DIAGNOSIS — Z452 Encounter for adjustment and management of vascular access device: Secondary | ICD-10-CM | POA: Diagnosis not present

## 2018-02-13 DIAGNOSIS — N183 Chronic kidney disease, stage 3 (moderate): Secondary | ICD-10-CM | POA: Diagnosis not present

## 2018-02-13 MED ORDER — MAGNESIUM OXIDE -MG SUPPLEMENT 200 MG PO TABS: 400.0000 mg | ORAL_TABLET | Freq: Two times a day (BID) | ORAL | 3 refills | Status: DC

## 2018-02-13 NOTE — Telephone Encounter (Signed)
Abnormal lab results received from [facility] drawn 02/06/18 Cr 1.95 BUN 27 K 4.5 NA134 Mg 1.7   Per Amy CLegg, NP Increase MagOX to 400 mg BID    Pt aware and voiced understanding

## 2018-02-14 ENCOUNTER — Other Ambulatory Visit (HOSPITAL_COMMUNITY): Payer: Self-pay | Admitting: Internal Medicine

## 2018-02-18 ENCOUNTER — Ambulatory Visit (HOSPITAL_COMMUNITY)
Admission: RE | Admit: 2018-02-18 | Discharge: 2018-02-18 | Disposition: A | Discharge status: Home / Self Care | Payer: Medicare Other | Source: Ambulatory Visit | Attending: Internal Medicine | Admitting: Internal Medicine

## 2018-02-18 ENCOUNTER — Ambulatory Visit (HOSPITAL_BASED_OUTPATIENT_CLINIC_OR_DEPARTMENT_OTHER)
Admission: RE | Admit: 2018-02-18 | Discharge: 2018-02-18 | Disposition: A | Discharge status: Home / Self Care | Payer: Medicare Other | Source: Ambulatory Visit | Attending: Internal Medicine | Admitting: Internal Medicine

## 2018-02-18 ENCOUNTER — Encounter (HOSPITAL_COMMUNITY): Payer: Self-pay | Admitting: Internal Medicine

## 2018-02-18 VITALS — BP 128/82 | HR 105 | Wt 157.4 lb

## 2018-02-18 DIAGNOSIS — I255 Ischemic cardiomyopathy: Secondary | ICD-10-CM

## 2018-02-18 DIAGNOSIS — I25118 Atherosclerotic heart disease of native coronary artery with other forms of angina pectoris: Secondary | ICD-10-CM | POA: Diagnosis not present

## 2018-02-18 DIAGNOSIS — N183 Chronic kidney disease, stage 3 unspecified: Secondary | ICD-10-CM

## 2018-02-18 DIAGNOSIS — Z79899 Other long term (current) drug therapy: Secondary | ICD-10-CM | POA: Insufficient documentation

## 2018-02-18 DIAGNOSIS — I2510 Atherosclerotic heart disease of native coronary artery without angina pectoris: Secondary | ICD-10-CM | POA: Insufficient documentation

## 2018-02-18 DIAGNOSIS — Z95810 Presence of automatic (implantable) cardiac defibrillator: Secondary | ICD-10-CM

## 2018-02-18 DIAGNOSIS — M109 Gout, unspecified: Secondary | ICD-10-CM | POA: Diagnosis not present

## 2018-02-18 DIAGNOSIS — Z8249 Family history of ischemic heart disease and other diseases of the circulatory system: Secondary | ICD-10-CM | POA: Diagnosis not present

## 2018-02-18 DIAGNOSIS — Z955 Presence of coronary angioplasty implant and graft: Secondary | ICD-10-CM | POA: Diagnosis not present

## 2018-02-18 DIAGNOSIS — I10 Essential (primary) hypertension: Secondary | ICD-10-CM | POA: Diagnosis not present

## 2018-02-18 DIAGNOSIS — Z950 Presence of cardiac pacemaker: Secondary | ICD-10-CM | POA: Insufficient documentation

## 2018-02-18 DIAGNOSIS — R310 Gross hematuria: Secondary | ICD-10-CM | POA: Insufficient documentation

## 2018-02-18 DIAGNOSIS — I5022 Chronic systolic (congestive) heart failure: Secondary | ICD-10-CM | POA: Insufficient documentation

## 2018-02-18 DIAGNOSIS — I252 Old myocardial infarction: Secondary | ICD-10-CM | POA: Insufficient documentation

## 2018-02-18 DIAGNOSIS — I130 Hypertensive heart and chronic kidney disease with heart failure and stage 1 through stage 4 chronic kidney disease, or unspecified chronic kidney disease: Secondary | ICD-10-CM | POA: Insufficient documentation

## 2018-02-18 DIAGNOSIS — I350 Nonrheumatic aortic (valve) stenosis: Secondary | ICD-10-CM | POA: Diagnosis not present

## 2018-02-18 DIAGNOSIS — E785 Hyperlipidemia, unspecified: Secondary | ICD-10-CM | POA: Insufficient documentation

## 2018-02-18 DIAGNOSIS — Z800 Family history of malignant neoplasm of digestive organs: Secondary | ICD-10-CM | POA: Diagnosis not present

## 2018-02-18 DIAGNOSIS — Z7982 Long term (current) use of aspirin: Secondary | ICD-10-CM | POA: Diagnosis not present

## 2018-02-18 MED ORDER — IVABRADINE HCL 7.5 MG PO TABS: 7.5000 mg | ORAL_TABLET | Freq: Two times a day (BID) | ORAL | 3 refills | Status: DC

## 2018-02-18 MED ORDER — ISOSORBIDE MONONITRATE ER 30 MG PO TB24: 30.0000 mg | ORAL_TABLET | Freq: Every day | ORAL | 3 refills | Status: DC

## 2018-02-18 MED ORDER — MAGNESIUM OXIDE -MG SUPPLEMENT 200 MG PO TABS: 400.0000 mg | ORAL_TABLET | Freq: Two times a day (BID) | ORAL | 3 refills | Status: DC

## 2018-02-18 MED ORDER — HYDRALAZINE HCL 50 MG PO TABS: 50.0000 mg | ORAL_TABLET | Freq: Three times a day (TID) | ORAL | 3 refills | Status: DC

## 2018-02-18 MED ORDER — EZETIMIBE 10 MG PO TABS: 10.0000 mg | ORAL_TABLET | Freq: Every day | ORAL | 3 refills | Status: DC

## 2018-02-18 MED ORDER — AMLODIPINE BESYLATE 10 MG PO TABS: 10.0000 mg | ORAL_TABLET | Freq: Every day | ORAL | 3 refills | Status: DC

## 2018-02-18 NOTE — Patient Instructions (Addendum)
INCREASE Corlanor to 7.5mg  (1 tab) twice a day. This prescription has been sent to your pharmacy.  You have been referred to [facility] in [place]. This office will call you to schedule an appointment.   You have been referred to Dr. Mosetta Pigeon @ [facility] for a transplant consideration. This office will call you to schedule an appointment.  Your physician recommends that you schedule a follow-up appointment in 2-3 months with Dr Haroldine Laws

## 2018-02-18 NOTE — Progress Notes (Signed)
Patient ID: Marvin Martin, male   DOB: 07-19-1965, 53 y.o.   MRN: [id number]    Advanced Heart Failure Clinic Note   Primary Care: Dr Laney Pastor Primary Cardiologist: Dr. Haroldine Laws  Nephrologist: Dr Joelyn Oms   HPI: Marvin Martin is a 53 y.o. male  with history of CAD, ICM, MI X4  with 17 stents, chronic systolic HF with EF 67% s/p St. Jude ICD, HTN, gout, and hyperlipidemia.  He just relocated to [facility] from [place]. His medical care was provided by [facility]. He has struggled with in-stent restenosis. Most recent heart cath was February 2016 with stent placed.   Admitted May 8th through May 12th, 2017 with chest pain. Had LHC as noted below no intervention.   Admitted June 5th through June 14th, 3419 with A/C systolic CHF. Had repeat R/LHC as below with stable CAD and low output. Started on milrinone and weaned to 0.125 mcg/kg/min for d/c. Coreg, lasix, and spiro stopped.  Entresto dropped from full dose to 24/26 mg, subsequently stopped.   He presents today for regular follow up. He is stable on milrinone. No changes at last visit with labile renal function. He denies SOB/PND/Orthopnea. He denies fevers, chills, or problems with his PICC line. Aprox 3 weeks ago, he had an episode of gross, painless hematuria. No episodes prior, or since. He has never smoked. No recent trauma or new sexual partners. No recent illness or significant increase in physical activity. His mother had CA in her stomach, but he is not sure about any other specifics. Lives at home with his 43 yo old son. Followed by [facility] for home milrinone. Denies ICD shocks.   Labs 02/06/17 Cr 1.9, K 4.5. Hgb 14.0  01/2015 ECHO  EF 20-25% RV normal  03/21/15: EF 15-20% RV ok  03/2016: Echo EF 15-20%.  02/2018: Echo LVEF 20%, RV OK. High sphericity index.   Corevue: No AF/VT.  Volume ok   [facility] 06/23/15  Dist RCA-1 lesion, 100% stenosed. The lesion was previously treated with a stent (unknown type).  Dist RCA-2 lesion,  100% stenosed. The lesion was previously treated with a stent (unknown type).  Prox RCA lesion, 70% stenosed. The lesion was previously treated with a stent (unknown type) and angioplasty.  Mid RCA lesion, 50% stenosed. The lesion was previously treated with a stent (unknown type).  Ost RPDA to RPDA lesion, 75% stenosed. The lesion was previously treated with a drug-eluting stent.  Mid LAD lesion, 70% stenosed.  Ost 2nd Diag lesion, 70% stenosed. Ao = 87/62 (71) LV = 81/9 RA = 2 RV = 20/0/2 PA = 24/12 (16) PCW = 4 Fick cardiac output/index = 3.1/1.8 Thermo CO/CI = 3.0/1.7 PVR = 3.9 WU Ao sat = 97% PA sat = 54%, 55%  R/LHC 05/27/15  Dist RCA-1 lesion, 100% stenosed. The lesion was previously treated with a stent (unknown type).  Dist RCA-2 lesion, 100% stenosed. The lesion was previously treated with a stent (unknown type).  Prox RCA lesion, 60% stenosed. The lesion was previously treated with a stent (unknown type) and angioplasty.  Mid RCA lesion, 40% stenosed. The lesion was previously treated with a stent (unknown type). Findings: RA = 1 RV = 22/1/5 PA = 27/13 (19) PCW = 6 Ao = 120/78 (96) LV = 124/4/13 Fick cardiac output/index = 3.3/1.9 PVR = 4.0 WU SVR = 2335  FA sat = 94% PA sat = 60%. 60%  CPX 01/15/15 FVC 2.67 (65%)    FEV1 2.18 (64%)  FEV1/FVC 81 (100%)     MVV 105 (73%) Peak VO2: 20.3 (56.3% predicted peak VO2) VE/VCO2 slope: 26.8 OUES: 1.52 Peak RER: 1.21  CPX 06/2015  Peak VO2: 19.9 (55.8% predicted peak VO2) VE/VCO2 slope: 31.6 OUES: 1.42 Peak RER: 1.26  Labs: 05/08/2016: Creatinine 2.44 K 4.8   SH: Separated from his wife. Has 4 children. 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         Father deceased but he is unsure of the cause. He had hyperlipidemia.         Sister: Heart disease  [facility]  [address] [place] [address] [phone]  Review of systems  complete and found to be negative unless listed in HPI.    Past Medical History:  Diagnosis Date  . Chronic systolic CHF (congestive heart failure) ([place])    a. 01/2015 Echo: EF 20-25%, antlat, inflat AK.  [name] CKD (chronic kidney disease), stage III ([place])   . Coronary artery disease    a. s/p MI x 4;  b. Reported h/o 17 stents with recurrent ISR;  c. 02/2014 Cath ([place]): PCI to unknown vessel;  d. 01/2015 MV: EF 18%, large scar, no ischemia; e. 03/2015 PCI: LM nl, LAD nl, D2 patent stent, RI patent stent, LCX patent stents p/m, RCA 95p ISR (PTCA), 72m, 100d into RPL CTO, RPDA 90 (2.5x16 Synergy DES).  . Hyperlipidemia   . Hypertensive heart disease   . Ischemic cardiomyopathy    a. s/p SJM ICD 2007 w/ gen change in 2012; b. 12/2014 CPX Test: mild to mod HF limitation w/ pVO2 20.3 and nl slope;  c. 01/2015 Echo: EF 20-25%.   Current Outpatient Medications  Medication Sig Dispense Refill  . allopurinol (ZYLOPRIM) 100 MG tablet Take 1 tablet (100 mg total) by mouth 2 (two) times daily. 60 tablet 6  . amLODipine (NORVASC) 10 MG tablet TAKE 1 TABLET BY MOUTH EVERY DAY 30 tablet 3  . aspirin 81 MG tablet Take 81 mg by mouth daily.    [name] BRILINTA 90 MG TABS tablet TAKE 1 TABLET BY MOUTH TWICE A DAY 180 tablet 1  . cholecalciferol (VITAMIN D) 1000 UNITS tablet Take 1,000 Units by mouth daily.    [name] 5 MG TABS tablet TAKE 1 TABLET (5 MG TOTAL) BY MOUTH 2 (TWO) TIMES DAILY WITH A MEAL. 60 tablet 3  . ENTRESTO 49-51 MG TAKE 1 TABLET BY MOUTH TWICE A DAY 60 tablet 3  . ezetimibe (ZETIA) 10 MG tablet TAKE 1 TABLET BY MOUTH EVERY DAY 90 tablet 0  . hydrALAZINE (APRESOLINE) 50 MG tablet Take 1 tablet (50 mg total) by mouth 3 (three) times daily. 90 tablet 3  . isosorbide mononitrate (IMDUR) 30 MG 24 hr tablet TAKE 1 TABLET (30 MG TOTAL) DAILY BY MOUTH. 90 tablet 3  . Magnesium Oxide 200 MG TABS Take 2 tablets (400 mg total) by mouth 2 (two) times daily. 120 tablet 3  . omega-3 acid ethyl esters (LOVAZA) 1 G  capsule Take 1 g by mouth daily.     . rosuvastatin (CRESTOR) 40 MG tablet TAKE 1 TABLET BY MOUTH EVERY DAY 30 tablet 11  . HYDROcodone-acetaminophen (NORCO/VICODIN) 5-325 MG tablet Take 1 tablet by mouth every 4 (four) hours as needed. (Patient not taking: Reported on 02/18/2018) 10 tablet 0  . nitroGLYCERIN (NITROSTAT) 0.4 MG SL tablet Place 1 tablet (0.4 mg total) under the tongue every 5 (five) minutes as needed for  chest pain. Reported on 04/12/2015 (Patient not taking: Reported on 02/18/2018) 60 tablet 1  . ondansetron (ZOFRAN) 4 MG tablet Take 1 tablet (4 mg total) every 8 (eight) hours as needed by mouth for nausea or vomiting. Reported on 05/24/2015 (Patient not taking: Reported on 02/18/2018) 30 tablet 1   No current facility-administered medications for this encounter.    No Known Allergies   Social History   Socioeconomic History  . Marital status: Legally Separated    Spouse name: Not on file  . Number of children: Not on file  . Years of education: Not on file  . Highest education level: Not on file  Occupational History  . Not on file  Social Needs  . Financial resource strain: Not on file  . Food insecurity:    Worry: Not on file    Inability: Not on file  . Transportation needs:    Medical: Not on file    Non-medical: Not on file  Tobacco Use  . Smoking status: Never Smoker  . Smokeless tobacco: Never Used  Substance and Sexual Activity  . Alcohol use: No    Alcohol/week: 0.0 standard drinks  . Drug use: No  . Sexual activity: Never  Lifestyle  . Physical activity:    Days per week: Not on file    Minutes per session: Not on file  . Stress: Not on file  Relationships  . Social connections:    Talks on phone: Not on file    Gets together: Not on file    Attends religious service: Not on file    Active member of club or organization: Not on file    Attends meetings of clubs or organizations: Not on file    Relationship status: Not on file  . Intimate partner  violence:    Fear of current or ex partner: Not on file    Emotionally abused: Not on file    Physically abused: Not on file    Forced sexual activity: Not on file  Other Topics Concern  . Not on file  Social History Narrative   Moved to [place] from [place] in 2016.  Lives alone in [place].    Family History  Problem Relation Age of Onset  . Cancer Mother        died when pt was 30.  She had a h/o heart dzs as well.  . Hypertension Mother   . Hyperlipidemia Father        Father died when pt was age 67 - he believes that he had a cardiac hx.  [name] CAD Sister    Vitals:   02/18/18 0943  BP: 128/82  Pulse: (!) 105  SpO2: 96%  Weight: 71.4 kg (157 lb 6.4 oz)    Wt Readings from Last 3 Encounters:  02/18/18 71.4 kg (157 lb 6.4 oz)  02/08/18 72.3 kg (159 lb 6 oz)  02/01/18 71.1 kg (156 lb 12.8 oz)    PHYSICAL EXAM: General: Well appearing. No resp difficulty. HEENT: Normal Neck: Supple. JVP 6-7 cm. Carotids 2+ bilat; no bruits. No thyromegaly or nodule noted. Cor: PMI nondisplaced. RRR, No M/G/R noted Lungs: CTAB, normal effort. Abdomen: Soft, non-tender, non-distended, no HSM. No bruits or masses. +BS  Extremities: No cyanosis, clubbing, or rash. RUE PICC double lumen.  Neuro: Alert & orientedx3, cranial nerves grossly intact. moves all 4 extremities w/o difficulty. Affect pleasant   ASSESSMENT & PLAN: 1. Chronic Systolic Heart Failure - ICM.  EF 15-20% on echo 3/18.  RV ok. Has ST Jude ICD - Pt who previously had NYHA IV symptoms, now stable with NYHA II-III on chronic milrinone at 0.125 mcg/kg/min via PICC. He has tolerated PICC line in RUE, not tunneled. He has not tolerated attempts to wean further.  - Echo today shows EF remains depressed at 15-20%.  - Corvue - Did not result. - Continue lasix as needed.  - No BB low output. - Continue corlanor 5 mg BID - Continue entresto 49/51 mg BID. Follow renal function  - Continue hydralazine 50 mg TID. - Continue imdur 30 mg daily.  - He  understands milrinone will not last forever. Not a candidate for transplant/advanced therapies due to lack of social support. - Previously, he has been disinterested in VAD. He is now interested in hearing more about VAD. He is still on the fence about transplant, but willing to consider. Will refer to Dr. Mosetta Pigeon at [facility] for consideration of heart vs heart/kidney. If not candidate, can consider as VAD candidate.  - Reinforced fluid restriction to < 2 L daily, sodium restriction to less than 2000 mg daily, and the importance of daily weights.   2. CAD- MI X4 17 stents  Most recent [place] 06/2015 - no intervention continued medical management.    Dist RCA-1 lesion, 100% stenosed. The lesion was previously treated with a stent (unknown type).  Dist RCA-2 lesion, 100% stenosed. The lesion was previously treated with a stent (unknown type).  Prox RCA lesion, 70% stenosed. The lesion was previously treated with a stent (unknown type) and angioplasty.  Mid RCA lesion, 50% stenosed. The lesion was previously treated with a stent (unknown type).  Ost RPDA to RPDA lesion, 75% stenosed. The lesion was previously treated with a drug-eluting stent.  Mid LAD lesion, 70% stenosed.  Ost 2nd Diag lesion, 70% stenosed. - No BB with low output on home milrinone. - No s/s of ischemia.    - Continue daily crestor.  3. HTN - Stable. Meds as above.  4. Hyperlipidemia - On Praulent and statin. No change.  5. CKD stage III  - Creatinine base 2.2-2.4. Continue to follow closely via [place]. - Most recent labwork with Cr stable in 1.9 range 6. Palpitations - Resolved. If recurrent will check holter monitor. 7. Gross hematuria - Painless. 1 episode several weeks ago. Needs urology assessment. His is not a former smoker. Mother had history of a non-specific "stomach" cancer.   Meds as above. Increase corlanor. Follow labs via [facility]. Refer to Dr. Elsie Saas for Transplant consideration, though also needs urgent urology  referral for painless hematuria.   Shirley Friar, PA-C  10:10 AM   Patient seen and examined with the above-signed Advanced Practice Provider and/or Housestaff. I personally reviewed laboratory data, imaging studies and relevant notes. I independently examined the patient and formulated the important aspects of the plan. I have edited the note to reflect any of my changes or salient points. I have personally discussed the plan with the patient and/or family.  He is doing well on milrinone. Previously NYHA IV. Now NYHA II-III. Volume status looks good. Remains somewhat tachycardic. Echo today reviewed personally. EF 15-20% RV ok. Long talk about advanced therapies and he would be willing to consider transplant or VAD. Will refer to [facility] for evaluation of heart-kidney transplant. His lack of a social support network has been a concern for [place] in the past. ICD interrogation ok. Send to Urology for w/u of painless hematuria.   Glori Bickers, MD  11:17  AM

## 2018-02-18 NOTE — Progress Notes (Signed)
  Echocardiogram 2D Echocardiogram has been performed.  Marvin Martin 02/18/2018, 9:39 AM

## 2018-02-19 DIAGNOSIS — I255 Ischemic cardiomyopathy: Secondary | ICD-10-CM | POA: Diagnosis not present

## 2018-02-19 DIAGNOSIS — Z452 Encounter for adjustment and management of vascular access device: Secondary | ICD-10-CM | POA: Diagnosis not present

## 2018-02-19 DIAGNOSIS — I130 Hypertensive heart and chronic kidney disease with heart failure and stage 1 through stage 4 chronic kidney disease, or unspecified chronic kidney disease: Secondary | ICD-10-CM | POA: Diagnosis not present

## 2018-02-19 DIAGNOSIS — N183 Chronic kidney disease, stage 3 (moderate): Secondary | ICD-10-CM | POA: Diagnosis not present

## 2018-02-19 DIAGNOSIS — I25110 Atherosclerotic heart disease of native coronary artery with unstable angina pectoris: Secondary | ICD-10-CM | POA: Diagnosis not present

## 2018-02-19 DIAGNOSIS — I5022 Chronic systolic (congestive) heart failure: Secondary | ICD-10-CM | POA: Diagnosis not present

## 2018-02-20 DIAGNOSIS — I25110 Atherosclerotic heart disease of native coronary artery with unstable angina pectoris: Secondary | ICD-10-CM | POA: Diagnosis not present

## 2018-02-20 DIAGNOSIS — Z1211 Encounter for screening for malignant neoplasm of colon: Secondary | ICD-10-CM | POA: Diagnosis not present

## 2018-02-20 DIAGNOSIS — Z7901 Long term (current) use of anticoagulants: Secondary | ICD-10-CM | POA: Diagnosis not present

## 2018-02-20 DIAGNOSIS — I130 Hypertensive heart and chronic kidney disease with heart failure and stage 1 through stage 4 chronic kidney disease, or unspecified chronic kidney disease: Secondary | ICD-10-CM | POA: Diagnosis not present

## 2018-02-20 DIAGNOSIS — Z7982 Long term (current) use of aspirin: Secondary | ICD-10-CM | POA: Diagnosis not present

## 2018-02-20 DIAGNOSIS — I5022 Chronic systolic (congestive) heart failure: Secondary | ICD-10-CM | POA: Diagnosis not present

## 2018-02-20 DIAGNOSIS — Z79899 Other long term (current) drug therapy: Secondary | ICD-10-CM | POA: Diagnosis not present

## 2018-02-20 DIAGNOSIS — Z950 Presence of cardiac pacemaker: Secondary | ICD-10-CM | POA: Diagnosis not present

## 2018-02-20 DIAGNOSIS — N183 Chronic kidney disease, stage 3 (moderate): Secondary | ICD-10-CM | POA: Diagnosis not present

## 2018-02-20 DIAGNOSIS — I255 Ischemic cardiomyopathy: Secondary | ICD-10-CM | POA: Diagnosis not present

## 2018-02-20 DIAGNOSIS — Z5181 Encounter for therapeutic drug level monitoring: Secondary | ICD-10-CM | POA: Diagnosis not present

## 2018-02-20 DIAGNOSIS — Z452 Encounter for adjustment and management of vascular access device: Secondary | ICD-10-CM | POA: Diagnosis not present

## 2018-02-20 DIAGNOSIS — Z955 Presence of coronary angioplasty implant and graft: Secondary | ICD-10-CM | POA: Diagnosis not present

## 2018-02-20 DIAGNOSIS — Z1212 Encounter for screening for malignant neoplasm of rectum: Secondary | ICD-10-CM | POA: Diagnosis not present

## 2018-02-21 ENCOUNTER — Other Ambulatory Visit (HOSPITAL_COMMUNITY): Payer: Self-pay | Admitting: Internal Medicine

## 2018-02-21 ENCOUNTER — Telehealth (HOSPITAL_COMMUNITY): Payer: Self-pay | Admitting: Cardiology

## 2018-02-21 NOTE — Telephone Encounter (Signed)
Abnormal labs received from [facility] drawn 02/20/2018 Cr 2.41 K 3.8 Na138 BUN 38   Per Amy Clegg,NP Repeat labs in one week    Order sent to [facility] for repeat labs x 1 week

## 2018-02-22 ENCOUNTER — Encounter: Payer: Medicare Other | Admitting: Gastroenterology

## 2018-02-25 ENCOUNTER — Other Ambulatory Visit (HOSPITAL_COMMUNITY): Payer: Self-pay | Admitting: Internal Medicine

## 2018-02-25 ENCOUNTER — Telehealth (HOSPITAL_COMMUNITY): Payer: Self-pay

## 2018-02-25 ENCOUNTER — Other Ambulatory Visit: Payer: Self-pay

## 2018-02-25 LAB — COLOGUARD: Cologuard: NEGATIVE

## 2018-02-25 NOTE — Telephone Encounter (Signed)
Patient referral and records faxed to [facility]- Echo report resulted 2/7. Fax confirmation received.

## 2018-02-26 ENCOUNTER — Telehealth: Payer: Self-pay | Admitting: Gastroenterology

## 2018-02-26 NOTE — Telephone Encounter (Signed)
Marvin Martin, [place]  Marvin Lasso, RN        Please let pt know Cologuard is negative :) he will need another for screening in 3 yrs, please place recall and make sure PCP has results. Thanks-JLL    The patient has been notified of this information and all questions answered.

## 2018-02-26 NOTE — Telephone Encounter (Signed)
PT is returning your call

## 2018-02-27 ENCOUNTER — Other Ambulatory Visit (HOSPITAL_COMMUNITY): Payer: Self-pay | Admitting: Internal Medicine

## 2018-02-27 DIAGNOSIS — N183 Chronic kidney disease, stage 3 (moderate): Secondary | ICD-10-CM | POA: Diagnosis not present

## 2018-02-27 DIAGNOSIS — Z452 Encounter for adjustment and management of vascular access device: Secondary | ICD-10-CM | POA: Diagnosis not present

## 2018-02-27 DIAGNOSIS — I5022 Chronic systolic (congestive) heart failure: Secondary | ICD-10-CM | POA: Diagnosis not present

## 2018-02-27 DIAGNOSIS — I255 Ischemic cardiomyopathy: Secondary | ICD-10-CM | POA: Diagnosis not present

## 2018-02-27 DIAGNOSIS — I130 Hypertensive heart and chronic kidney disease with heart failure and stage 1 through stage 4 chronic kidney disease, or unspecified chronic kidney disease: Secondary | ICD-10-CM | POA: Diagnosis not present

## 2018-02-27 DIAGNOSIS — I25110 Atherosclerotic heart disease of native coronary artery with unstable angina pectoris: Secondary | ICD-10-CM | POA: Diagnosis not present

## 2018-02-28 ENCOUNTER — Other Ambulatory Visit: Payer: Self-pay | Admitting: Internal Medicine

## 2018-02-28 NOTE — Telephone Encounter (Signed)
°*  STAT* If patient is at the pharmacy, call can be transferred to refill team.   1. Which medications need to be refilled? (please list name of each medication and dose if known) Alirocumab 75 mg  2. Which pharmacy/location (including street and city if local pharmacy) is medication to be sent to? [facility] [address] [place] [phone]  3. Do they need a 30 day or 90 day supply? 90 day

## 2018-03-06 DIAGNOSIS — Z452 Encounter for adjustment and management of vascular access device: Secondary | ICD-10-CM | POA: Diagnosis not present

## 2018-03-06 DIAGNOSIS — I255 Ischemic cardiomyopathy: Secondary | ICD-10-CM | POA: Diagnosis not present

## 2018-03-06 DIAGNOSIS — I25110 Atherosclerotic heart disease of native coronary artery with unstable angina pectoris: Secondary | ICD-10-CM | POA: Diagnosis not present

## 2018-03-06 DIAGNOSIS — I130 Hypertensive heart and chronic kidney disease with heart failure and stage 1 through stage 4 chronic kidney disease, or unspecified chronic kidney disease: Secondary | ICD-10-CM | POA: Diagnosis not present

## 2018-03-06 DIAGNOSIS — I5022 Chronic systolic (congestive) heart failure: Secondary | ICD-10-CM | POA: Diagnosis not present

## 2018-03-06 DIAGNOSIS — N183 Chronic kidney disease, stage 3 (moderate): Secondary | ICD-10-CM | POA: Diagnosis not present

## 2018-03-08 ENCOUNTER — Telehealth (HOSPITAL_COMMUNITY): Payer: Self-pay | Admitting: Cardiology

## 2018-03-08 NOTE — Telephone Encounter (Signed)
ABNORMAL LABS RECEIVED FROM [facility] LABS DRAWN 03/06/18 K 4.2 CR 2.59 BUN 55 NA 136    PER ASHLEY SMITH,NP REPEAT LABS X 1 WEEK   MESSAGE TO [facility] WITH ORDER FOR REPEAT LABS

## 2018-03-13 DIAGNOSIS — I130 Hypertensive heart and chronic kidney disease with heart failure and stage 1 through stage 4 chronic kidney disease, or unspecified chronic kidney disease: Secondary | ICD-10-CM | POA: Diagnosis not present

## 2018-03-13 DIAGNOSIS — N183 Chronic kidney disease, stage 3 (moderate): Secondary | ICD-10-CM | POA: Diagnosis not present

## 2018-03-13 DIAGNOSIS — I5022 Chronic systolic (congestive) heart failure: Secondary | ICD-10-CM | POA: Diagnosis not present

## 2018-03-13 DIAGNOSIS — I25110 Atherosclerotic heart disease of native coronary artery with unstable angina pectoris: Secondary | ICD-10-CM | POA: Diagnosis not present

## 2018-03-13 DIAGNOSIS — Z452 Encounter for adjustment and management of vascular access device: Secondary | ICD-10-CM | POA: Diagnosis not present

## 2018-03-13 DIAGNOSIS — I255 Ischemic cardiomyopathy: Secondary | ICD-10-CM | POA: Diagnosis not present

## 2018-03-15 ENCOUNTER — Other Ambulatory Visit (HOSPITAL_COMMUNITY): Payer: Self-pay | Admitting: Internal Medicine

## 2018-03-18 MED ORDER — ALIROCUMAB 75 MG/ML ~~LOC~~ SOAJ: 75.0000 mg | SUBCUTANEOUS | 3 refills | Status: DC

## 2018-03-20 ENCOUNTER — Other Ambulatory Visit (HOSPITAL_COMMUNITY): Payer: Self-pay | Admitting: Internal Medicine

## 2018-03-20 DIAGNOSIS — I25110 Atherosclerotic heart disease of native coronary artery with unstable angina pectoris: Secondary | ICD-10-CM | POA: Diagnosis not present

## 2018-03-20 DIAGNOSIS — I255 Ischemic cardiomyopathy: Secondary | ICD-10-CM | POA: Diagnosis not present

## 2018-03-20 DIAGNOSIS — I5022 Chronic systolic (congestive) heart failure: Secondary | ICD-10-CM | POA: Diagnosis not present

## 2018-03-20 DIAGNOSIS — Z452 Encounter for adjustment and management of vascular access device: Secondary | ICD-10-CM | POA: Diagnosis not present

## 2018-03-20 DIAGNOSIS — I130 Hypertensive heart and chronic kidney disease with heart failure and stage 1 through stage 4 chronic kidney disease, or unspecified chronic kidney disease: Secondary | ICD-10-CM | POA: Diagnosis not present

## 2018-03-20 DIAGNOSIS — N183 Chronic kidney disease, stage 3 (moderate): Secondary | ICD-10-CM | POA: Diagnosis not present

## 2018-03-21 DIAGNOSIS — I255 Ischemic cardiomyopathy: Secondary | ICD-10-CM | POA: Diagnosis not present

## 2018-03-21 DIAGNOSIS — Z452 Encounter for adjustment and management of vascular access device: Secondary | ICD-10-CM | POA: Diagnosis not present

## 2018-03-21 DIAGNOSIS — N183 Chronic kidney disease, stage 3 (moderate): Secondary | ICD-10-CM | POA: Diagnosis not present

## 2018-03-21 DIAGNOSIS — I130 Hypertensive heart and chronic kidney disease with heart failure and stage 1 through stage 4 chronic kidney disease, or unspecified chronic kidney disease: Secondary | ICD-10-CM | POA: Diagnosis not present

## 2018-03-21 DIAGNOSIS — I25110 Atherosclerotic heart disease of native coronary artery with unstable angina pectoris: Secondary | ICD-10-CM | POA: Diagnosis not present

## 2018-03-21 DIAGNOSIS — I5022 Chronic systolic (congestive) heart failure: Secondary | ICD-10-CM | POA: Diagnosis not present

## 2018-03-22 ENCOUNTER — Other Ambulatory Visit (HOSPITAL_COMMUNITY): Payer: Self-pay | Admitting: Internal Medicine

## 2018-03-22 DIAGNOSIS — Z5181 Encounter for therapeutic drug level monitoring: Secondary | ICD-10-CM | POA: Diagnosis not present

## 2018-03-22 DIAGNOSIS — N183 Chronic kidney disease, stage 3 (moderate): Secondary | ICD-10-CM | POA: Diagnosis not present

## 2018-03-22 DIAGNOSIS — I130 Hypertensive heart and chronic kidney disease with heart failure and stage 1 through stage 4 chronic kidney disease, or unspecified chronic kidney disease: Secondary | ICD-10-CM | POA: Diagnosis not present

## 2018-03-22 DIAGNOSIS — Z7901 Long term (current) use of anticoagulants: Secondary | ICD-10-CM | POA: Diagnosis not present

## 2018-03-22 DIAGNOSIS — Z452 Encounter for adjustment and management of vascular access device: Secondary | ICD-10-CM | POA: Diagnosis not present

## 2018-03-22 DIAGNOSIS — Z79899 Other long term (current) drug therapy: Secondary | ICD-10-CM | POA: Diagnosis not present

## 2018-03-22 DIAGNOSIS — Z955 Presence of coronary angioplasty implant and graft: Secondary | ICD-10-CM | POA: Diagnosis not present

## 2018-03-22 DIAGNOSIS — I25110 Atherosclerotic heart disease of native coronary artery with unstable angina pectoris: Secondary | ICD-10-CM | POA: Diagnosis not present

## 2018-03-22 DIAGNOSIS — I255 Ischemic cardiomyopathy: Secondary | ICD-10-CM | POA: Diagnosis not present

## 2018-03-22 DIAGNOSIS — Z950 Presence of cardiac pacemaker: Secondary | ICD-10-CM | POA: Diagnosis not present

## 2018-03-22 DIAGNOSIS — Z7982 Long term (current) use of aspirin: Secondary | ICD-10-CM | POA: Diagnosis not present

## 2018-03-22 DIAGNOSIS — I5022 Chronic systolic (congestive) heart failure: Secondary | ICD-10-CM | POA: Diagnosis not present

## 2018-03-25 ENCOUNTER — Telehealth (HOSPITAL_COMMUNITY): Payer: Self-pay

## 2018-03-25 NOTE — Telephone Encounter (Addendum)
Prior authorization through [employer] was initiated for praluent medication and sent via [place] on 03/25/2018.

## 2018-03-25 NOTE — Telephone Encounter (Signed)
Prior authorization through [employer] was APPROVED for [employer] and will expire on 03/25/2019.

## 2018-03-27 DIAGNOSIS — Z452 Encounter for adjustment and management of vascular access device: Secondary | ICD-10-CM | POA: Diagnosis not present

## 2018-03-27 DIAGNOSIS — I130 Hypertensive heart and chronic kidney disease with heart failure and stage 1 through stage 4 chronic kidney disease, or unspecified chronic kidney disease: Secondary | ICD-10-CM | POA: Diagnosis not present

## 2018-03-27 DIAGNOSIS — I255 Ischemic cardiomyopathy: Secondary | ICD-10-CM | POA: Diagnosis not present

## 2018-03-27 DIAGNOSIS — N183 Chronic kidney disease, stage 3 (moderate): Secondary | ICD-10-CM | POA: Diagnosis not present

## 2018-03-27 DIAGNOSIS — I5022 Chronic systolic (congestive) heart failure: Secondary | ICD-10-CM | POA: Diagnosis not present

## 2018-03-27 DIAGNOSIS — I25110 Atherosclerotic heart disease of native coronary artery with unstable angina pectoris: Secondary | ICD-10-CM | POA: Diagnosis not present

## 2018-03-28 DIAGNOSIS — I5022 Chronic systolic (congestive) heart failure: Secondary | ICD-10-CM | POA: Diagnosis not present

## 2018-03-28 DIAGNOSIS — I25110 Atherosclerotic heart disease of native coronary artery with unstable angina pectoris: Secondary | ICD-10-CM | POA: Diagnosis not present

## 2018-03-28 DIAGNOSIS — I130 Hypertensive heart and chronic kidney disease with heart failure and stage 1 through stage 4 chronic kidney disease, or unspecified chronic kidney disease: Secondary | ICD-10-CM | POA: Diagnosis not present

## 2018-03-28 DIAGNOSIS — Z452 Encounter for adjustment and management of vascular access device: Secondary | ICD-10-CM | POA: Diagnosis not present

## 2018-03-28 DIAGNOSIS — I255 Ischemic cardiomyopathy: Secondary | ICD-10-CM | POA: Diagnosis not present

## 2018-03-28 DIAGNOSIS — N183 Chronic kidney disease, stage 3 (moderate): Secondary | ICD-10-CM | POA: Diagnosis not present

## 2018-04-03 ENCOUNTER — Other Ambulatory Visit: Payer: Self-pay | Admitting: Internal Medicine

## 2018-04-03 ENCOUNTER — Other Ambulatory Visit (HOSPITAL_COMMUNITY): Payer: Self-pay | Admitting: Internal Medicine

## 2018-04-03 DIAGNOSIS — I5022 Chronic systolic (congestive) heart failure: Secondary | ICD-10-CM | POA: Diagnosis not present

## 2018-04-03 DIAGNOSIS — N183 Chronic kidney disease, stage 3 (moderate): Secondary | ICD-10-CM | POA: Diagnosis not present

## 2018-04-03 DIAGNOSIS — I130 Hypertensive heart and chronic kidney disease with heart failure and stage 1 through stage 4 chronic kidney disease, or unspecified chronic kidney disease: Secondary | ICD-10-CM | POA: Diagnosis not present

## 2018-04-03 DIAGNOSIS — I25110 Atherosclerotic heart disease of native coronary artery with unstable angina pectoris: Secondary | ICD-10-CM | POA: Diagnosis not present

## 2018-04-03 DIAGNOSIS — Z452 Encounter for adjustment and management of vascular access device: Secondary | ICD-10-CM | POA: Diagnosis not present

## 2018-04-03 DIAGNOSIS — I255 Ischemic cardiomyopathy: Secondary | ICD-10-CM | POA: Diagnosis not present

## 2018-04-10 DIAGNOSIS — I255 Ischemic cardiomyopathy: Secondary | ICD-10-CM | POA: Diagnosis not present

## 2018-04-10 DIAGNOSIS — I25110 Atherosclerotic heart disease of native coronary artery with unstable angina pectoris: Secondary | ICD-10-CM | POA: Diagnosis not present

## 2018-04-10 DIAGNOSIS — Z79899 Other long term (current) drug therapy: Secondary | ICD-10-CM | POA: Diagnosis not present

## 2018-04-10 DIAGNOSIS — N183 Chronic kidney disease, stage 3 (moderate): Secondary | ICD-10-CM | POA: Diagnosis not present

## 2018-04-10 DIAGNOSIS — I5022 Chronic systolic (congestive) heart failure: Secondary | ICD-10-CM | POA: Diagnosis not present

## 2018-04-10 DIAGNOSIS — Z452 Encounter for adjustment and management of vascular access device: Secondary | ICD-10-CM | POA: Diagnosis not present

## 2018-04-10 DIAGNOSIS — I130 Hypertensive heart and chronic kidney disease with heart failure and stage 1 through stage 4 chronic kidney disease, or unspecified chronic kidney disease: Secondary | ICD-10-CM | POA: Diagnosis not present

## 2018-04-17 DIAGNOSIS — I255 Ischemic cardiomyopathy: Secondary | ICD-10-CM | POA: Diagnosis not present

## 2018-04-17 DIAGNOSIS — I5022 Chronic systolic (congestive) heart failure: Secondary | ICD-10-CM | POA: Diagnosis not present

## 2018-04-17 DIAGNOSIS — Z452 Encounter for adjustment and management of vascular access device: Secondary | ICD-10-CM | POA: Diagnosis not present

## 2018-04-17 DIAGNOSIS — N183 Chronic kidney disease, stage 3 (moderate): Secondary | ICD-10-CM | POA: Diagnosis not present

## 2018-04-17 DIAGNOSIS — I25110 Atherosclerotic heart disease of native coronary artery with unstable angina pectoris: Secondary | ICD-10-CM | POA: Diagnosis not present

## 2018-04-17 DIAGNOSIS — I130 Hypertensive heart and chronic kidney disease with heart failure and stage 1 through stage 4 chronic kidney disease, or unspecified chronic kidney disease: Secondary | ICD-10-CM | POA: Diagnosis not present

## 2018-04-21 DIAGNOSIS — N183 Chronic kidney disease, stage 3 (moderate): Secondary | ICD-10-CM | POA: Diagnosis not present

## 2018-04-21 DIAGNOSIS — I255 Ischemic cardiomyopathy: Secondary | ICD-10-CM | POA: Diagnosis not present

## 2018-04-21 DIAGNOSIS — Z7982 Long term (current) use of aspirin: Secondary | ICD-10-CM | POA: Diagnosis not present

## 2018-04-21 DIAGNOSIS — Z5181 Encounter for therapeutic drug level monitoring: Secondary | ICD-10-CM | POA: Diagnosis not present

## 2018-04-21 DIAGNOSIS — Z7901 Long term (current) use of anticoagulants: Secondary | ICD-10-CM | POA: Diagnosis not present

## 2018-04-21 DIAGNOSIS — Z79899 Other long term (current) drug therapy: Secondary | ICD-10-CM | POA: Diagnosis not present

## 2018-04-21 DIAGNOSIS — I130 Hypertensive heart and chronic kidney disease with heart failure and stage 1 through stage 4 chronic kidney disease, or unspecified chronic kidney disease: Secondary | ICD-10-CM | POA: Diagnosis not present

## 2018-04-21 DIAGNOSIS — I25110 Atherosclerotic heart disease of native coronary artery with unstable angina pectoris: Secondary | ICD-10-CM | POA: Diagnosis not present

## 2018-04-21 DIAGNOSIS — Z955 Presence of coronary angioplasty implant and graft: Secondary | ICD-10-CM | POA: Diagnosis not present

## 2018-04-21 DIAGNOSIS — I5022 Chronic systolic (congestive) heart failure: Secondary | ICD-10-CM | POA: Diagnosis not present

## 2018-04-21 DIAGNOSIS — Z452 Encounter for adjustment and management of vascular access device: Secondary | ICD-10-CM | POA: Diagnosis not present

## 2018-04-21 DIAGNOSIS — Z950 Presence of cardiac pacemaker: Secondary | ICD-10-CM | POA: Diagnosis not present

## 2018-04-23 ENCOUNTER — Other Ambulatory Visit (HOSPITAL_COMMUNITY): Payer: Self-pay | Admitting: Internal Medicine

## 2018-04-24 DIAGNOSIS — I5022 Chronic systolic (congestive) heart failure: Secondary | ICD-10-CM | POA: Diagnosis not present

## 2018-04-24 DIAGNOSIS — I255 Ischemic cardiomyopathy: Secondary | ICD-10-CM | POA: Diagnosis not present

## 2018-04-24 DIAGNOSIS — I25110 Atherosclerotic heart disease of native coronary artery with unstable angina pectoris: Secondary | ICD-10-CM | POA: Diagnosis not present

## 2018-04-24 DIAGNOSIS — I130 Hypertensive heart and chronic kidney disease with heart failure and stage 1 through stage 4 chronic kidney disease, or unspecified chronic kidney disease: Secondary | ICD-10-CM | POA: Diagnosis not present

## 2018-04-24 DIAGNOSIS — Z452 Encounter for adjustment and management of vascular access device: Secondary | ICD-10-CM | POA: Diagnosis not present

## 2018-04-24 DIAGNOSIS — N183 Chronic kidney disease, stage 3 (moderate): Secondary | ICD-10-CM | POA: Diagnosis not present

## 2018-04-25 ENCOUNTER — Other Ambulatory Visit: Payer: Self-pay

## 2018-04-25 ENCOUNTER — Ambulatory Visit (INDEPENDENT_AMBULATORY_CARE_PROVIDER_SITE_OTHER): Payer: Medicare Other | Admitting: *Deleted

## 2018-04-25 DIAGNOSIS — I255 Ischemic cardiomyopathy: Secondary | ICD-10-CM

## 2018-04-25 LAB — CUP PACEART REMOTE DEVICE CHECK
Battery Remaining Longevity: 13 mo
Battery Remaining Percentage: 16 %
Battery Voltage: 2.74 V
Brady Statistic AP VP Percent: 1 %
Brady Statistic AP VS Percent: 1.5 %
Brady Statistic AS VP Percent: 1 %
Brady Statistic AS VS Percent: 95 %
Brady Statistic RA Percent Paced: 1 %
Brady Statistic RV Percent Paced: 1 %
Date Time Interrogation Session: 20200409060016
HighPow Impedance: 109 Ohm
Implantable Lead Implant Date: 20121107
Implantable Lead Implant Date: 20121107
Implantable Lead Location: 753859
Implantable Lead Location: 753860
Implantable Lead Model: 7000
Implantable Pulse Generator Implant Date: 20121107
Lead Channel Impedance Value: 300 Ohm
Lead Channel Impedance Value: 600 Ohm
Lead Channel Sensing Intrinsic Amplitude: 11.5 mV
Lead Channel Sensing Intrinsic Amplitude: 5 mV
Lead Channel Setting Pacing Amplitude: 2 V
Lead Channel Setting Pacing Amplitude: 2.5 V
Lead Channel Setting Pacing Pulse Width: 0.7 ms
Lead Channel Setting Sensing Sensitivity: 0.5 mV
Pulse Gen Serial Number: 1033795

## 2018-05-01 DIAGNOSIS — N183 Chronic kidney disease, stage 3 (moderate): Secondary | ICD-10-CM | POA: Diagnosis not present

## 2018-05-01 DIAGNOSIS — I255 Ischemic cardiomyopathy: Secondary | ICD-10-CM | POA: Diagnosis not present

## 2018-05-01 DIAGNOSIS — Z452 Encounter for adjustment and management of vascular access device: Secondary | ICD-10-CM | POA: Diagnosis not present

## 2018-05-01 DIAGNOSIS — I5022 Chronic systolic (congestive) heart failure: Secondary | ICD-10-CM | POA: Diagnosis not present

## 2018-05-01 DIAGNOSIS — I130 Hypertensive heart and chronic kidney disease with heart failure and stage 1 through stage 4 chronic kidney disease, or unspecified chronic kidney disease: Secondary | ICD-10-CM | POA: Diagnosis not present

## 2018-05-01 DIAGNOSIS — I25110 Atherosclerotic heart disease of native coronary artery with unstable angina pectoris: Secondary | ICD-10-CM | POA: Diagnosis not present

## 2018-05-03 ENCOUNTER — Encounter: Payer: Self-pay | Admitting: Cardiology

## 2018-05-03 NOTE — Progress Notes (Signed)
Remote ICD transmission.   

## 2018-05-06 ENCOUNTER — Other Ambulatory Visit (HOSPITAL_COMMUNITY): Payer: Self-pay | Admitting: Internal Medicine

## 2018-05-07 ENCOUNTER — Other Ambulatory Visit (HOSPITAL_COMMUNITY): Payer: Self-pay | Admitting: Internal Medicine

## 2018-05-08 ENCOUNTER — Other Ambulatory Visit: Payer: Self-pay | Admitting: Internal Medicine

## 2018-05-08 DIAGNOSIS — N183 Chronic kidney disease, stage 3 (moderate): Secondary | ICD-10-CM | POA: Diagnosis not present

## 2018-05-08 DIAGNOSIS — I25110 Atherosclerotic heart disease of native coronary artery with unstable angina pectoris: Secondary | ICD-10-CM | POA: Diagnosis not present

## 2018-05-08 DIAGNOSIS — I5022 Chronic systolic (congestive) heart failure: Secondary | ICD-10-CM | POA: Diagnosis not present

## 2018-05-08 DIAGNOSIS — Z452 Encounter for adjustment and management of vascular access device: Secondary | ICD-10-CM | POA: Diagnosis not present

## 2018-05-08 DIAGNOSIS — I130 Hypertensive heart and chronic kidney disease with heart failure and stage 1 through stage 4 chronic kidney disease, or unspecified chronic kidney disease: Secondary | ICD-10-CM | POA: Diagnosis not present

## 2018-05-08 DIAGNOSIS — I255 Ischemic cardiomyopathy: Secondary | ICD-10-CM | POA: Diagnosis not present

## 2018-05-09 LAB — CBC WITH DIFFERENTIAL/PLATELET
Basophils Absolute: 0 10*3/uL (ref 0.0–0.2)
Basos: 1 %
EOS (ABSOLUTE): 0.1 10*3/uL (ref 0.0–0.4)
Eos: 1 %
Hematocrit: 43.2 % (ref 37.5–51.0)
Hemoglobin: 15 g/dL (ref 13.0–17.7)
Immature Grans (Abs): 0 10*3/uL (ref 0.0–0.1)
Immature Granulocytes: 0 %
Lymphocytes Absolute: 1.3 10*3/uL (ref 0.7–3.1)
Lymphs: 27 %
MCH: 34 pg — ABNORMAL HIGH (ref 26.6–33.0)
MCHC: 34.7 g/dL (ref 31.5–35.7)
MCV: 98 fL — ABNORMAL HIGH (ref 79–97)
Monocytes Absolute: 0.3 10*3/uL (ref 0.1–0.9)
Monocytes: 7 %
Neutrophils Absolute: 3.1 10*3/uL (ref 1.4–7.0)
Neutrophils: 64 %
Platelets: 203 10*3/uL (ref 150–450)
RBC: 4.41 x10E6/uL (ref 4.14–5.80)
RDW: 12.3 % (ref 11.6–15.4)
WBC: 4.9 10*3/uL (ref 3.4–10.8)

## 2018-05-09 LAB — COMPREHENSIVE METABOLIC PANEL
ALT: 16 IU/L (ref 0–44)
AST: 20 IU/L (ref 0–40)
Albumin/Globulin Ratio: 1.3 (ref 1.2–2.2)
Albumin: 4.4 g/dL (ref 3.8–4.9)
Alkaline Phosphatase: 76 IU/L (ref 39–117)
BUN/Creatinine Ratio: 13 (ref 9–20)
BUN: 26 mg/dL — ABNORMAL HIGH (ref 6–24)
Bilirubin Total: 0.4 mg/dL (ref 0.0–1.2)
CO2: 16 mmol/L — ABNORMAL LOW (ref 20–29)
Calcium: 9.7 mg/dL (ref 8.7–10.2)
Chloride: 103 mmol/L (ref 96–106)
Creatinine, Ser: 2.05 mg/dL — ABNORMAL HIGH (ref 0.76–1.27)
GFR calc Af Amer: 42 mL/min/{1.73_m2} — ABNORMAL LOW (ref >59)
GFR calc non Af Amer: 36 mL/min/{1.73_m2} — ABNORMAL LOW (ref >59)
Globulin, Total: 3.4 g/dL (ref 1.5–4.5)
Glucose: 159 mg/dL — ABNORMAL HIGH (ref 65–99)
Potassium: 4.5 mmol/L (ref 3.5–5.2)
Sodium: 137 mmol/L (ref 134–144)
Total Protein: 7.8 g/dL (ref 6.0–8.5)

## 2018-05-09 LAB — MAGNESIUM: Magnesium: 2 mg/dL (ref 1.6–2.3)

## 2018-05-10 ENCOUNTER — Encounter: Payer: Self-pay | Admitting: Internal Medicine

## 2018-05-10 NOTE — Progress Notes (Signed)
[facility]lin alert transmission: HV impedance trending increased alert for 115 ohms (previous 99 ohms) Routed to Dr Caryl Comes for review.     Chanetta Marshall, NP 05/10/2018 10:09 AM

## 2018-05-13 DIAGNOSIS — N183 Chronic kidney disease, stage 3 (moderate): Secondary | ICD-10-CM | POA: Diagnosis not present

## 2018-05-13 NOTE — Progress Notes (Signed)
Next scheduled remote 7/9 - will recheck then  Chanetta Marshall, NP 05/13/2018 3:51 PM

## 2018-05-13 NOTE — Progress Notes (Signed)
Gradual changes presumably related to exit block-- lets recheck in 2 month

## 2018-05-15 DIAGNOSIS — I25110 Atherosclerotic heart disease of native coronary artery with unstable angina pectoris: Secondary | ICD-10-CM | POA: Diagnosis not present

## 2018-05-15 DIAGNOSIS — I130 Hypertensive heart and chronic kidney disease with heart failure and stage 1 through stage 4 chronic kidney disease, or unspecified chronic kidney disease: Secondary | ICD-10-CM | POA: Diagnosis not present

## 2018-05-15 DIAGNOSIS — I255 Ischemic cardiomyopathy: Secondary | ICD-10-CM | POA: Diagnosis not present

## 2018-05-15 DIAGNOSIS — I5022 Chronic systolic (congestive) heart failure: Secondary | ICD-10-CM | POA: Diagnosis not present

## 2018-05-15 DIAGNOSIS — Z452 Encounter for adjustment and management of vascular access device: Secondary | ICD-10-CM | POA: Diagnosis not present

## 2018-05-15 DIAGNOSIS — N183 Chronic kidney disease, stage 3 (moderate): Secondary | ICD-10-CM | POA: Diagnosis not present

## 2018-05-19 ENCOUNTER — Other Ambulatory Visit (HOSPITAL_COMMUNITY): Payer: Self-pay | Admitting: Internal Medicine

## 2018-05-21 DIAGNOSIS — Z7901 Long term (current) use of anticoagulants: Secondary | ICD-10-CM | POA: Diagnosis not present

## 2018-05-21 DIAGNOSIS — I130 Hypertensive heart and chronic kidney disease with heart failure and stage 1 through stage 4 chronic kidney disease, or unspecified chronic kidney disease: Secondary | ICD-10-CM | POA: Diagnosis not present

## 2018-05-21 DIAGNOSIS — I255 Ischemic cardiomyopathy: Secondary | ICD-10-CM | POA: Diagnosis not present

## 2018-05-21 DIAGNOSIS — Z950 Presence of cardiac pacemaker: Secondary | ICD-10-CM | POA: Diagnosis not present

## 2018-05-21 DIAGNOSIS — I5022 Chronic systolic (congestive) heart failure: Secondary | ICD-10-CM | POA: Diagnosis not present

## 2018-05-21 DIAGNOSIS — Z7982 Long term (current) use of aspirin: Secondary | ICD-10-CM | POA: Diagnosis not present

## 2018-05-21 DIAGNOSIS — Z452 Encounter for adjustment and management of vascular access device: Secondary | ICD-10-CM | POA: Diagnosis not present

## 2018-05-21 DIAGNOSIS — I25110 Atherosclerotic heart disease of native coronary artery with unstable angina pectoris: Secondary | ICD-10-CM | POA: Diagnosis not present

## 2018-05-21 DIAGNOSIS — Z79899 Other long term (current) drug therapy: Secondary | ICD-10-CM | POA: Diagnosis not present

## 2018-05-21 DIAGNOSIS — N183 Chronic kidney disease, stage 3 (moderate): Secondary | ICD-10-CM | POA: Diagnosis not present

## 2018-05-21 DIAGNOSIS — Z5181 Encounter for therapeutic drug level monitoring: Secondary | ICD-10-CM | POA: Diagnosis not present

## 2018-05-21 DIAGNOSIS — Z955 Presence of coronary angioplasty implant and graft: Secondary | ICD-10-CM | POA: Diagnosis not present

## 2018-05-22 ENCOUNTER — Other Ambulatory Visit: Payer: Self-pay

## 2018-05-22 ENCOUNTER — Ambulatory Visit (HOSPITAL_COMMUNITY)
Admission: RE | Admit: 2018-05-22 | Discharge: 2018-05-22 | Disposition: A | Discharge status: Home / Self Care | Payer: Medicare Other | Source: Ambulatory Visit | Attending: Internal Medicine | Admitting: Internal Medicine

## 2018-05-22 DIAGNOSIS — I2510 Atherosclerotic heart disease of native coronary artery without angina pectoris: Secondary | ICD-10-CM

## 2018-05-22 DIAGNOSIS — I5022 Chronic systolic (congestive) heart failure: Secondary | ICD-10-CM | POA: Diagnosis not present

## 2018-05-22 DIAGNOSIS — N183 Chronic kidney disease, stage 3 (moderate): Secondary | ICD-10-CM | POA: Diagnosis not present

## 2018-05-22 DIAGNOSIS — R310 Gross hematuria: Secondary | ICD-10-CM

## 2018-05-22 DIAGNOSIS — Z452 Encounter for adjustment and management of vascular access device: Secondary | ICD-10-CM | POA: Diagnosis not present

## 2018-05-22 DIAGNOSIS — I255 Ischemic cardiomyopathy: Secondary | ICD-10-CM | POA: Diagnosis not present

## 2018-05-22 DIAGNOSIS — I130 Hypertensive heart and chronic kidney disease with heart failure and stage 1 through stage 4 chronic kidney disease, or unspecified chronic kidney disease: Secondary | ICD-10-CM | POA: Diagnosis not present

## 2018-05-22 DIAGNOSIS — I25110 Atherosclerotic heart disease of native coronary artery with unstable angina pectoris: Secondary | ICD-10-CM | POA: Diagnosis not present

## 2018-05-22 NOTE — Progress Notes (Signed)
Heart Failure TeleHealth Note  Due to national recommendations of social distancing due to [place] 19, Audio/video telehealth visit is felt to be most appropriate for this patient at this time.  See MyChart message from today for patient consent regarding telehealth for [facility].  Date:  05/22/2018   ID:  Marvin Martin, DOB 11-24-1965, MRN [id number]  Location: Home  Provider location: [facility] Type of Visit: Established patient  PCP:  Charlott Rakes, MD  Cardiologist:  No primary care provider on file. Primary HF: Macee Venables  Chief Complaint: Heart Failure follow-up   History of Present Illness:  Marvin Martin is a 53 y.o. male  with history of CAD, ICM, MI X4  with 17 stents, chronic systolic HF with EF 46% s/p St. Jude ICD, HTN, gout, and hyperlipidemia.  He relocated from [facility] from [place] several years ago. His medical care was provided by [facility] He has struggled with in-stent restenosis.n.   Admitted June 5th through June 14th, 5035 with A/C systolic CHF. Had repeat R/LHC as below with stable CAD and low output. Started on milrinone and weaned to 0.125 mcg/kg/min for d/c. Coreg, lasix, and spiro stopped.  Entresto dropped from full dose to 24/26 mg, subsequently stopped.   He presents via Engineer, civil (consulting) for a telehealth visit today. He is stable on milrinone 0.125. Feels good. Denies CP or SOB. No edema. No problems with milrinone. No edema. Weight stable at 150. No CP. Hematuria resolved. Did not see Urology yet but is going to see Nephrology tomorrow. Followed by [facility] for home milrinone. Denies ICD shocks.    Marvin Martin denies symptoms worrisome for COVID 19.   Past Medical History:  Diagnosis Date  . Chronic systolic CHF (congestive heart failure) ([place])    a. 01/2015 Echo: EF 20-25%, antlat, inflat AK.  Marvin Martin CKD (chronic kidney disease), stage III ([place])   . Coronary artery disease    a. s/p MI x 4;   b. Reported h/o 17 stents with recurrent ISR;  c. 02/2014 Cath ([place]): PCI to unknown vessel;  d. 01/2015 MV: EF 18%, large scar, no ischemia; e. 03/2015 PCI: LM nl, LAD nl, D2 patent stent, RI patent stent, LCX patent stents p/m, RCA 95p ISR (PTCA), 67m, 100d into RPL CTO, RPDA 90 (2.5x16 Synergy DES).  . Hyperlipidemia   . Hypertensive heart disease   . Ischemic cardiomyopathy    a. s/p SJM ICD 2007 w/ gen change in 2012; b. 12/2014 CPX Test: mild to mod HF limitation w/ pVO2 20.3 and nl slope;  c. 01/2015 Echo: EF 20-25%.   Past Surgical History:  Procedure Laterality Date  . 17 stints placements Bilateral   . CARDIAC CATHETERIZATION     Most recent 02/2014   . CARDIAC CATHETERIZATION N/A 03/31/2015   Procedure: Left Heart Cath and Coronary Angiography;  Surgeon: Troy Sine, MD;  Location: [place] CV LAB;  Service: Cardiovascular;  Laterality: N/A;  . CARDIAC CATHETERIZATION N/A 03/31/2015   Procedure: Coronary Stent Intervention;  Surgeon: Troy Sine, MD;  Location: [place] CV LAB;  Service: Cardiovascular;  Laterality: N/A;  . CARDIAC CATHETERIZATION N/A 05/27/2015   Procedure: Right/Left Heart Cath and Coronary Angiography;  Surgeon: Jolaine Artist, MD;  Location: [place] CV LAB;  Service: Cardiovascular;  Laterality: N/A;  . CARDIAC CATHETERIZATION N/A 06/23/2015   Procedure: Right/Left Heart Cath and Coronary Angiography;  Surgeon: Jolaine Artist, MD;  Location: [facility] INVASIVE CV  LAB;  Service: Cardiovascular;  Laterality: N/A;  . INSERT / REPLACE / REMOVE PACEMAKER     St jude 2006  Generator Change 2012   . PACEMAKER GENERATOR CHANGE Bilateral      Current Outpatient Medications  Medication Sig Dispense Refill  . Alirocumab (PRALUENT) 75 MG/ML SOAJ Inject 75 mg into the skin every 14 (fourteen) days. 6 pen 3  . allopurinol (ZYLOPRIM) 100 MG tablet Take 1 tablet (100 mg total) by mouth 2 (two) times daily. 60 tablet 6  . amLODipine (NORVASC) 10 MG tablet TAKE 1  TABLET BY MOUTH EVERY DAY 90 tablet 1  . aspirin 81 MG tablet Take 81 mg by mouth daily.    Marvin Martin BRILINTA 90 MG TABS tablet TAKE 1 TABLET BY MOUTH TWICE A DAY 180 tablet 1  . cholecalciferol (VITAMIN D) 1000 UNITS tablet Take 1,000 Units by mouth daily.    Marvin Martin ENTRESTO 49-51 MG TAKE 1 TABLET BY MOUTH TWICE A DAY 60 tablet 5  . ezetimibe (ZETIA) 10 MG tablet TAKE 1 TABLET BY MOUTH EVERY DAY 90 tablet 1  . hydrALAZINE (APRESOLINE) 50 MG tablet Take 1 tablet (50 mg total) by mouth 3 (three) times daily. 90 tablet 3  . HYDROcodone-acetaminophen (NORCO/VICODIN) 5-325 MG tablet Take 1 tablet by mouth every 4 (four) hours as needed. (Patient not taking: Reported on 02/18/2018) 10 tablet 0  . isosorbide mononitrate (IMDUR) 30 MG 24 hr tablet Take 1 tablet (30 mg total) by mouth daily. 30 tablet 3  . ivabradine (CORLANOR) 7.5 MG TABS tablet Take 1 tablet (7.5 mg total) by mouth 2 (two) times daily with a meal. 60 tablet 3  . Magnesium Oxide 200 MG TABS Take 2 tablets (400 mg total) by mouth 2 (two) times daily. 120 tablet 3  . nitroGLYCERIN (NITROSTAT) 0.4 MG SL tablet Place 1 tablet (0.4 mg total) under the tongue every 5 (five) minutes as needed for chest pain. Reported on 04/12/2015 (Patient not taking: Reported on 02/18/2018) 60 tablet 1  . omega-3 acid ethyl esters (LOVAZA) 1 G capsule Take 1 g by mouth daily.     . ondansetron (ZOFRAN) 4 MG tablet Take 1 tablet (4 mg total) every 8 (eight) hours as needed by mouth for nausea or vomiting. Reported on 05/24/2015 (Patient not taking: Reported on 02/18/2018) 30 tablet 1  . rosuvastatin (CRESTOR) 40 MG tablet TAKE 1 TABLET BY MOUTH EVERY DAY 30 tablet 11   No current facility-administered medications for this encounter.    Allergies:   Patient has no known allergies.   Social History:  The patient  reports that he has never smoked. He has never used smokeless tobacco. He reports that he does not drink alcohol or use drugs.   Family History:  The patient's family  history includes CAD in his sister; Cancer in his mother; Hyperlipidemia in his father; Hypertension in his mother.   ROS:  Please see the history of present illness.   All other systems are personally reviewed and negative.   Exam:  (Video/Tele Health Call; Exam is subjective and or/visual.) General:  Speaks in full sentences. No resp difficulty. Lungs: Normal respiratory effort with conversation.  Abdomen: Non-distended per patient report Extremities: Pt denies edema. Neuro: Alert & oriented x 3.   Recent Labs: 05/08/2018: ALT 16; BUN 26; Creatinine, Ser 2.05; Hemoglobin 15.0; Magnesium 2.0; Platelets 203; Potassium 4.5; Sodium 137  Personally reviewed   Wt Readings from Last 3 Encounters:  02/18/18 71.4 kg (157 lb 6.4 oz)  02/08/18 72.3 kg (159 lb 6 oz)  02/01/18 71.1 kg (156 lb 12.8 oz)      ASSESSMENT AND PLAN:  1. Chronic Systolic Heart Failure - ICM.  EF 15-20% on echo 3/18. RV ok. Has ST Jude ICD - Pt who previously had NYHA IV symptoms, now stable with NYHA II-III on chronic milrinone at 0.125 mcg/kg/min via PICC. He has tolerated PICC line in RUE, not tunneled. He has not tolerated attempts to wean further.  - Echo 2/20 shows EF remains depressed at 15-20%.  - Continue prn lasix - has not needed - No BB low output. - Continue corlanor 5 mg BID - Continue entresto 49/51 mg BID. Follow renal function  - Continue hydralazine 50 mg TID. - Continue imdur 30 mg daily.  - He understands milrinone will not last forever.  - Has transplant eval at [facility] May 18-21 - will likely need heart-kidney  2. CAD- MI X4 17 stents  Most recent [place] 06/2015 - no intervention continued medical management.    Dist RCA-1 lesion, 100% stenosed. The lesion was previously treated with a stent (unknown type).  Dist RCA-2 lesion, 100% stenosed. The lesion was previously treated with a stent (unknown type).  Prox RCA lesion, 70% stenosed. The lesion was previously treated with a stent (unknown type)  and angioplasty.  Mid RCA lesion, 50% stenosed. The lesion was previously treated with a stent (unknown type).  Ost RPDA to RPDA lesion, 75% stenosed. The lesion was previously treated with a drug-eluting stent.  Mid LAD lesion, 70% stenosed.  Ost 2nd Diag lesion, 70% stenosed. - No BB with low output on home milrinone. - No s/so of ischemia - Continue daily crestor.   3. HTN - Stable. Meds as above.   4. Hyperlipidemia - On Praulent and statin. No change.   5. CKD stage III  - Creatinine base 2.2-2.4. Continue to follow closely via [place]. - Most recent labwork with Cr stable in 2.0 range on 4/22  5. Gross hematuria - Resolved. Has f/u with Nephrology   COVID screen The patient does not have any symptoms that suggest any further testing/ screening at this time.  Social distancing reinforced today.  Recommended follow-up:  As above  Relevant cardiac medications were reviewed at length with the patient today.   The patient does not have concerns regarding their medications at this time.   The following changes were made today:  As above  Today, I have spent 14 minutes with the patient with telehealth technology discussing the above issues .    Signed, Glori Bickers, MD  05/22/2018 9:26 AM  Advanced Heart Failure [place] [address] [phone] (office) [phone] (fax)

## 2018-05-22 NOTE — Addendum Note (Signed)
Encounter addended by: Scarlette Calico, RN on: 05/22/2018 9:50 AM  Actions taken: Clinical Note Signed

## 2018-05-22 NOTE — Patient Instructions (Signed)
Your physician recommends that you schedule a follow-up appointment in: 3 months our office will contact you to schedule this appointment.  If you have any questions or concerns before your next appointment please send [place] a message through [place] or call our office at [phone].

## 2018-05-22 NOTE — Progress Notes (Signed)
AVS mailed to pt, message sent to schedulers to arrange next appt.

## 2018-05-23 DIAGNOSIS — N2581 Secondary hyperparathyroidism of renal origin: Secondary | ICD-10-CM | POA: Diagnosis not present

## 2018-05-23 DIAGNOSIS — I509 Heart failure, unspecified: Secondary | ICD-10-CM | POA: Diagnosis not present

## 2018-05-23 DIAGNOSIS — N183 Chronic kidney disease, stage 3 (moderate): Secondary | ICD-10-CM | POA: Diagnosis not present

## 2018-05-23 DIAGNOSIS — I129 Hypertensive chronic kidney disease with stage 1 through stage 4 chronic kidney disease, or unspecified chronic kidney disease: Secondary | ICD-10-CM | POA: Diagnosis not present

## 2018-05-23 DIAGNOSIS — E785 Hyperlipidemia, unspecified: Secondary | ICD-10-CM | POA: Diagnosis not present

## 2018-05-27 ENCOUNTER — Other Ambulatory Visit (HOSPITAL_COMMUNITY): Payer: Self-pay | Admitting: Internal Medicine

## 2018-05-29 DIAGNOSIS — Z452 Encounter for adjustment and management of vascular access device: Secondary | ICD-10-CM | POA: Diagnosis not present

## 2018-05-29 DIAGNOSIS — N183 Chronic kidney disease, stage 3 (moderate): Secondary | ICD-10-CM | POA: Diagnosis not present

## 2018-05-29 DIAGNOSIS — I130 Hypertensive heart and chronic kidney disease with heart failure and stage 1 through stage 4 chronic kidney disease, or unspecified chronic kidney disease: Secondary | ICD-10-CM | POA: Diagnosis not present

## 2018-05-29 DIAGNOSIS — I255 Ischemic cardiomyopathy: Secondary | ICD-10-CM | POA: Diagnosis not present

## 2018-05-29 DIAGNOSIS — I25110 Atherosclerotic heart disease of native coronary artery with unstable angina pectoris: Secondary | ICD-10-CM | POA: Diagnosis not present

## 2018-05-29 DIAGNOSIS — I5022 Chronic systolic (congestive) heart failure: Secondary | ICD-10-CM | POA: Diagnosis not present

## 2018-06-03 DIAGNOSIS — I701 Atherosclerosis of renal artery: Secondary | ICD-10-CM | POA: Diagnosis not present

## 2018-06-03 DIAGNOSIS — Z1159 Encounter for screening for other viral diseases: Secondary | ICD-10-CM | POA: Diagnosis not present

## 2018-06-03 DIAGNOSIS — Z01818 Encounter for other preprocedural examination: Secondary | ICD-10-CM | POA: Diagnosis not present

## 2018-06-03 DIAGNOSIS — Z125 Encounter for screening for malignant neoplasm of prostate: Secondary | ICD-10-CM | POA: Diagnosis not present

## 2018-06-03 DIAGNOSIS — I5022 Chronic systolic (congestive) heart failure: Secondary | ICD-10-CM | POA: Diagnosis not present

## 2018-06-03 DIAGNOSIS — F489 Nonpsychotic mental disorder, unspecified: Secondary | ICD-10-CM | POA: Diagnosis not present

## 2018-06-03 DIAGNOSIS — N183 Chronic kidney disease, stage 3 (moderate): Secondary | ICD-10-CM | POA: Diagnosis not present

## 2018-06-03 DIAGNOSIS — I255 Ischemic cardiomyopathy: Secondary | ICD-10-CM | POA: Diagnosis not present

## 2018-06-04 DIAGNOSIS — R931 Abnormal findings on diagnostic imaging of heart and coronary circulation: Secondary | ICD-10-CM | POA: Diagnosis not present

## 2018-06-04 DIAGNOSIS — I340 Nonrheumatic mitral (valve) insufficiency: Secondary | ICD-10-CM | POA: Diagnosis not present

## 2018-06-04 DIAGNOSIS — I701 Atherosclerosis of renal artery: Secondary | ICD-10-CM | POA: Diagnosis not present

## 2018-06-04 DIAGNOSIS — I255 Ischemic cardiomyopathy: Secondary | ICD-10-CM | POA: Diagnosis not present

## 2018-06-04 DIAGNOSIS — R0609 Other forms of dyspnea: Secondary | ICD-10-CM | POA: Diagnosis not present

## 2018-06-04 DIAGNOSIS — I517 Cardiomegaly: Secondary | ICD-10-CM | POA: Diagnosis not present

## 2018-06-04 DIAGNOSIS — Z7682 Awaiting organ transplant status: Secondary | ICD-10-CM | POA: Diagnosis not present

## 2018-06-04 DIAGNOSIS — N183 Chronic kidney disease, stage 3 (moderate): Secondary | ICD-10-CM | POA: Diagnosis not present

## 2018-06-04 DIAGNOSIS — I519 Heart disease, unspecified: Secondary | ICD-10-CM | POA: Diagnosis not present

## 2018-06-05 DIAGNOSIS — Z01818 Encounter for other preprocedural examination: Secondary | ICD-10-CM | POA: Diagnosis not present

## 2018-06-05 DIAGNOSIS — I255 Ischemic cardiomyopathy: Secondary | ICD-10-CM | POA: Diagnosis not present

## 2018-06-05 DIAGNOSIS — N183 Chronic kidney disease, stage 3 (moderate): Secondary | ICD-10-CM | POA: Diagnosis not present

## 2018-06-05 DIAGNOSIS — I25110 Atherosclerotic heart disease of native coronary artery with unstable angina pectoris: Secondary | ICD-10-CM | POA: Diagnosis not present

## 2018-06-05 DIAGNOSIS — I5022 Chronic systolic (congestive) heart failure: Secondary | ICD-10-CM | POA: Diagnosis not present

## 2018-06-05 DIAGNOSIS — Z452 Encounter for adjustment and management of vascular access device: Secondary | ICD-10-CM | POA: Diagnosis not present

## 2018-06-05 DIAGNOSIS — Z79899 Other long term (current) drug therapy: Secondary | ICD-10-CM | POA: Diagnosis not present

## 2018-06-05 DIAGNOSIS — I701 Atherosclerosis of renal artery: Secondary | ICD-10-CM | POA: Diagnosis not present

## 2018-06-05 DIAGNOSIS — I2510 Atherosclerotic heart disease of native coronary artery without angina pectoris: Secondary | ICD-10-CM | POA: Diagnosis not present

## 2018-06-05 DIAGNOSIS — E785 Hyperlipidemia, unspecified: Secondary | ICD-10-CM | POA: Diagnosis not present

## 2018-06-05 DIAGNOSIS — Z7682 Awaiting organ transplant status: Secondary | ICD-10-CM | POA: Diagnosis not present

## 2018-06-05 DIAGNOSIS — I130 Hypertensive heart and chronic kidney disease with heart failure and stage 1 through stage 4 chronic kidney disease, or unspecified chronic kidney disease: Secondary | ICD-10-CM | POA: Diagnosis not present

## 2018-06-06 ENCOUNTER — Telehealth (HOSPITAL_COMMUNITY): Payer: Self-pay | Admitting: *Deleted

## 2018-06-06 NOTE — Telephone Encounter (Signed)
Order sent to [facility] for repeat bmet next week per Lillia Mountain, NP  Potassium unavailable labs hemolyzed this week. Last Potassium on 4/22 was  4.5

## 2018-06-07 DIAGNOSIS — I5022 Chronic systolic (congestive) heart failure: Secondary | ICD-10-CM | POA: Diagnosis not present

## 2018-06-07 DIAGNOSIS — I2510 Atherosclerotic heart disease of native coronary artery without angina pectoris: Secondary | ICD-10-CM | POA: Diagnosis not present

## 2018-06-07 DIAGNOSIS — N183 Chronic kidney disease, stage 3 (moderate): Secondary | ICD-10-CM | POA: Diagnosis not present

## 2018-06-07 DIAGNOSIS — E785 Hyperlipidemia, unspecified: Secondary | ICD-10-CM | POA: Diagnosis not present

## 2018-06-07 DIAGNOSIS — Z7682 Awaiting organ transplant status: Secondary | ICD-10-CM | POA: Diagnosis not present

## 2018-06-07 DIAGNOSIS — I701 Atherosclerosis of renal artery: Secondary | ICD-10-CM | POA: Diagnosis not present

## 2018-06-07 DIAGNOSIS — I255 Ischemic cardiomyopathy: Secondary | ICD-10-CM | POA: Diagnosis not present

## 2018-06-12 DIAGNOSIS — Z452 Encounter for adjustment and management of vascular access device: Secondary | ICD-10-CM | POA: Diagnosis not present

## 2018-06-12 DIAGNOSIS — I130 Hypertensive heart and chronic kidney disease with heart failure and stage 1 through stage 4 chronic kidney disease, or unspecified chronic kidney disease: Secondary | ICD-10-CM | POA: Diagnosis not present

## 2018-06-12 DIAGNOSIS — I25110 Atherosclerotic heart disease of native coronary artery with unstable angina pectoris: Secondary | ICD-10-CM | POA: Diagnosis not present

## 2018-06-12 DIAGNOSIS — I255 Ischemic cardiomyopathy: Secondary | ICD-10-CM | POA: Diagnosis not present

## 2018-06-12 DIAGNOSIS — I5022 Chronic systolic (congestive) heart failure: Secondary | ICD-10-CM | POA: Diagnosis not present

## 2018-06-12 DIAGNOSIS — N183 Chronic kidney disease, stage 3 (moderate): Secondary | ICD-10-CM | POA: Diagnosis not present

## 2018-06-15 ENCOUNTER — Other Ambulatory Visit (HOSPITAL_COMMUNITY): Payer: Self-pay | Admitting: Internal Medicine

## 2018-06-19 DIAGNOSIS — I5022 Chronic systolic (congestive) heart failure: Secondary | ICD-10-CM | POA: Diagnosis not present

## 2018-06-19 DIAGNOSIS — I25110 Atherosclerotic heart disease of native coronary artery with unstable angina pectoris: Secondary | ICD-10-CM | POA: Diagnosis not present

## 2018-06-19 DIAGNOSIS — N183 Chronic kidney disease, stage 3 (moderate): Secondary | ICD-10-CM | POA: Diagnosis not present

## 2018-06-19 DIAGNOSIS — Z452 Encounter for adjustment and management of vascular access device: Secondary | ICD-10-CM | POA: Diagnosis not present

## 2018-06-19 DIAGNOSIS — I130 Hypertensive heart and chronic kidney disease with heart failure and stage 1 through stage 4 chronic kidney disease, or unspecified chronic kidney disease: Secondary | ICD-10-CM | POA: Diagnosis not present

## 2018-06-19 DIAGNOSIS — I255 Ischemic cardiomyopathy: Secondary | ICD-10-CM | POA: Diagnosis not present

## 2018-06-20 ENCOUNTER — Other Ambulatory Visit (HOSPITAL_COMMUNITY): Payer: Self-pay | Admitting: Internal Medicine

## 2018-06-20 DIAGNOSIS — Z7901 Long term (current) use of anticoagulants: Secondary | ICD-10-CM | POA: Diagnosis not present

## 2018-06-20 DIAGNOSIS — Z950 Presence of cardiac pacemaker: Secondary | ICD-10-CM | POA: Diagnosis not present

## 2018-06-20 DIAGNOSIS — N183 Chronic kidney disease, stage 3 (moderate): Secondary | ICD-10-CM | POA: Diagnosis not present

## 2018-06-20 DIAGNOSIS — I130 Hypertensive heart and chronic kidney disease with heart failure and stage 1 through stage 4 chronic kidney disease, or unspecified chronic kidney disease: Secondary | ICD-10-CM | POA: Diagnosis not present

## 2018-06-20 DIAGNOSIS — Z79899 Other long term (current) drug therapy: Secondary | ICD-10-CM | POA: Diagnosis not present

## 2018-06-20 DIAGNOSIS — Z5181 Encounter for therapeutic drug level monitoring: Secondary | ICD-10-CM | POA: Diagnosis not present

## 2018-06-20 DIAGNOSIS — Z452 Encounter for adjustment and management of vascular access device: Secondary | ICD-10-CM | POA: Diagnosis not present

## 2018-06-20 DIAGNOSIS — I5022 Chronic systolic (congestive) heart failure: Secondary | ICD-10-CM | POA: Diagnosis not present

## 2018-06-20 DIAGNOSIS — Z7982 Long term (current) use of aspirin: Secondary | ICD-10-CM | POA: Diagnosis not present

## 2018-06-20 DIAGNOSIS — Z955 Presence of coronary angioplasty implant and graft: Secondary | ICD-10-CM | POA: Diagnosis not present

## 2018-06-20 DIAGNOSIS — I255 Ischemic cardiomyopathy: Secondary | ICD-10-CM | POA: Diagnosis not present

## 2018-06-20 DIAGNOSIS — I25110 Atherosclerotic heart disease of native coronary artery with unstable angina pectoris: Secondary | ICD-10-CM | POA: Diagnosis not present

## 2018-06-21 ENCOUNTER — Other Ambulatory Visit (HOSPITAL_COMMUNITY): Payer: Self-pay | Admitting: Internal Medicine

## 2018-06-26 DIAGNOSIS — I255 Ischemic cardiomyopathy: Secondary | ICD-10-CM | POA: Diagnosis not present

## 2018-06-26 DIAGNOSIS — I25110 Atherosclerotic heart disease of native coronary artery with unstable angina pectoris: Secondary | ICD-10-CM | POA: Diagnosis not present

## 2018-06-26 DIAGNOSIS — I5022 Chronic systolic (congestive) heart failure: Secondary | ICD-10-CM | POA: Diagnosis not present

## 2018-06-26 DIAGNOSIS — Z452 Encounter for adjustment and management of vascular access device: Secondary | ICD-10-CM | POA: Diagnosis not present

## 2018-06-26 DIAGNOSIS — N183 Chronic kidney disease, stage 3 (moderate): Secondary | ICD-10-CM | POA: Diagnosis not present

## 2018-06-26 DIAGNOSIS — I130 Hypertensive heart and chronic kidney disease with heart failure and stage 1 through stage 4 chronic kidney disease, or unspecified chronic kidney disease: Secondary | ICD-10-CM | POA: Diagnosis not present

## 2018-06-27 ENCOUNTER — Other Ambulatory Visit: Payer: Self-pay | Admitting: Family Medicine

## 2018-06-28 ENCOUNTER — Other Ambulatory Visit (HOSPITAL_COMMUNITY): Payer: Self-pay | Admitting: Internal Medicine

## 2018-07-03 ENCOUNTER — Telehealth (HOSPITAL_COMMUNITY): Payer: Self-pay | Admitting: Licensed Clinical Social Worker

## 2018-07-03 DIAGNOSIS — I5022 Chronic systolic (congestive) heart failure: Secondary | ICD-10-CM | POA: Diagnosis not present

## 2018-07-03 DIAGNOSIS — I25110 Atherosclerotic heart disease of native coronary artery with unstable angina pectoris: Secondary | ICD-10-CM | POA: Diagnosis not present

## 2018-07-03 DIAGNOSIS — N183 Chronic kidney disease, stage 3 (moderate): Secondary | ICD-10-CM | POA: Diagnosis not present

## 2018-07-03 DIAGNOSIS — I255 Ischemic cardiomyopathy: Secondary | ICD-10-CM | POA: Diagnosis not present

## 2018-07-03 DIAGNOSIS — Z452 Encounter for adjustment and management of vascular access device: Secondary | ICD-10-CM | POA: Diagnosis not present

## 2018-07-03 DIAGNOSIS — I130 Hypertensive heart and chronic kidney disease with heart failure and stage 1 through stage 4 chronic kidney disease, or unspecified chronic kidney disease: Secondary | ICD-10-CM | POA: Diagnosis not present

## 2018-07-03 NOTE — Telephone Encounter (Signed)
CSW received SCAT application and completed provider section and returned to patient for submission via mail. CSW left message for patient to inform of return application and signature required prior to submitting to SCAT. Raquel Sarna, [place], [place]

## 2018-07-05 ENCOUNTER — Other Ambulatory Visit (HOSPITAL_COMMUNITY): Payer: Self-pay

## 2018-07-05 ENCOUNTER — Other Ambulatory Visit (HOSPITAL_COMMUNITY): Payer: Self-pay | Admitting: Internal Medicine

## 2018-07-05 NOTE — Progress Notes (Signed)
error 

## 2018-07-09 ENCOUNTER — Other Ambulatory Visit (HOSPITAL_COMMUNITY): Payer: Self-pay

## 2018-07-09 MED ORDER — PRALUENT 75 MG/ML ~~LOC~~ SOAJ: 75.0000 mg | SUBCUTANEOUS | 3 refills | Status: DC

## 2018-07-10 ENCOUNTER — Encounter (HOSPITAL_COMMUNITY): Payer: Self-pay | Admitting: *Deleted

## 2018-07-10 DIAGNOSIS — Z452 Encounter for adjustment and management of vascular access device: Secondary | ICD-10-CM | POA: Diagnosis not present

## 2018-07-10 DIAGNOSIS — I25110 Atherosclerotic heart disease of native coronary artery with unstable angina pectoris: Secondary | ICD-10-CM | POA: Diagnosis not present

## 2018-07-10 DIAGNOSIS — I130 Hypertensive heart and chronic kidney disease with heart failure and stage 1 through stage 4 chronic kidney disease, or unspecified chronic kidney disease: Secondary | ICD-10-CM | POA: Diagnosis not present

## 2018-07-10 DIAGNOSIS — I255 Ischemic cardiomyopathy: Secondary | ICD-10-CM | POA: Diagnosis not present

## 2018-07-10 DIAGNOSIS — N183 Chronic kidney disease, stage 3 (moderate): Secondary | ICD-10-CM | POA: Diagnosis not present

## 2018-07-10 DIAGNOSIS — I5022 Chronic systolic (congestive) heart failure: Secondary | ICD-10-CM | POA: Diagnosis not present

## 2018-07-10 NOTE — Progress Notes (Signed)
Received letter from [facility], pt was evaluated for heart-kidney transplant at this time he was declined as a heart transplant candidate and is not a candidate for kidney alone.  Message sent to Dr Haroldine Laws

## 2018-07-17 DIAGNOSIS — I255 Ischemic cardiomyopathy: Secondary | ICD-10-CM | POA: Diagnosis not present

## 2018-07-17 DIAGNOSIS — Z452 Encounter for adjustment and management of vascular access device: Secondary | ICD-10-CM | POA: Diagnosis not present

## 2018-07-17 DIAGNOSIS — I25110 Atherosclerotic heart disease of native coronary artery with unstable angina pectoris: Secondary | ICD-10-CM | POA: Diagnosis not present

## 2018-07-17 DIAGNOSIS — N183 Chronic kidney disease, stage 3 (moderate): Secondary | ICD-10-CM | POA: Diagnosis not present

## 2018-07-17 DIAGNOSIS — I5022 Chronic systolic (congestive) heart failure: Secondary | ICD-10-CM | POA: Diagnosis not present

## 2018-07-17 DIAGNOSIS — I130 Hypertensive heart and chronic kidney disease with heart failure and stage 1 through stage 4 chronic kidney disease, or unspecified chronic kidney disease: Secondary | ICD-10-CM | POA: Diagnosis not present

## 2018-07-20 DIAGNOSIS — Z7982 Long term (current) use of aspirin: Secondary | ICD-10-CM | POA: Diagnosis not present

## 2018-07-20 DIAGNOSIS — Z79899 Other long term (current) drug therapy: Secondary | ICD-10-CM | POA: Diagnosis not present

## 2018-07-20 DIAGNOSIS — N183 Chronic kidney disease, stage 3 (moderate): Secondary | ICD-10-CM | POA: Diagnosis not present

## 2018-07-20 DIAGNOSIS — I5022 Chronic systolic (congestive) heart failure: Secondary | ICD-10-CM | POA: Diagnosis not present

## 2018-07-20 DIAGNOSIS — Z950 Presence of cardiac pacemaker: Secondary | ICD-10-CM | POA: Diagnosis not present

## 2018-07-20 DIAGNOSIS — Z7901 Long term (current) use of anticoagulants: Secondary | ICD-10-CM | POA: Diagnosis not present

## 2018-07-20 DIAGNOSIS — I25110 Atherosclerotic heart disease of native coronary artery with unstable angina pectoris: Secondary | ICD-10-CM | POA: Diagnosis not present

## 2018-07-20 DIAGNOSIS — Z5181 Encounter for therapeutic drug level monitoring: Secondary | ICD-10-CM | POA: Diagnosis not present

## 2018-07-20 DIAGNOSIS — Z955 Presence of coronary angioplasty implant and graft: Secondary | ICD-10-CM | POA: Diagnosis not present

## 2018-07-20 DIAGNOSIS — Z452 Encounter for adjustment and management of vascular access device: Secondary | ICD-10-CM | POA: Diagnosis not present

## 2018-07-20 DIAGNOSIS — I130 Hypertensive heart and chronic kidney disease with heart failure and stage 1 through stage 4 chronic kidney disease, or unspecified chronic kidney disease: Secondary | ICD-10-CM | POA: Diagnosis not present

## 2018-07-20 DIAGNOSIS — I255 Ischemic cardiomyopathy: Secondary | ICD-10-CM | POA: Diagnosis not present

## 2018-07-23 ENCOUNTER — Other Ambulatory Visit (HOSPITAL_COMMUNITY): Payer: Self-pay | Admitting: Internal Medicine

## 2018-07-24 DIAGNOSIS — I25110 Atherosclerotic heart disease of native coronary artery with unstable angina pectoris: Secondary | ICD-10-CM | POA: Diagnosis not present

## 2018-07-24 DIAGNOSIS — I5022 Chronic systolic (congestive) heart failure: Secondary | ICD-10-CM | POA: Diagnosis not present

## 2018-07-24 DIAGNOSIS — I130 Hypertensive heart and chronic kidney disease with heart failure and stage 1 through stage 4 chronic kidney disease, or unspecified chronic kidney disease: Secondary | ICD-10-CM | POA: Diagnosis not present

## 2018-07-24 DIAGNOSIS — Z452 Encounter for adjustment and management of vascular access device: Secondary | ICD-10-CM | POA: Diagnosis not present

## 2018-07-24 DIAGNOSIS — N183 Chronic kidney disease, stage 3 (moderate): Secondary | ICD-10-CM | POA: Diagnosis not present

## 2018-07-24 DIAGNOSIS — I255 Ischemic cardiomyopathy: Secondary | ICD-10-CM | POA: Diagnosis not present

## 2018-07-25 ENCOUNTER — Ambulatory Visit (INDEPENDENT_AMBULATORY_CARE_PROVIDER_SITE_OTHER): Payer: Medicare Other | Admitting: *Deleted

## 2018-07-25 DIAGNOSIS — I5022 Chronic systolic (congestive) heart failure: Secondary | ICD-10-CM

## 2018-07-25 DIAGNOSIS — I255 Ischemic cardiomyopathy: Secondary | ICD-10-CM

## 2018-07-25 LAB — CUP PACEART REMOTE DEVICE CHECK
Battery Remaining Longevity: 10 mo
Battery Remaining Percentage: 11 %
Battery Voltage: 2.69 V
Brady Statistic AP VP Percent: 1 %
Brady Statistic AP VS Percent: 1.6 %
Brady Statistic AS VP Percent: 1 %
Brady Statistic AS VS Percent: 95 %
Brady Statistic RA Percent Paced: 1 %
Brady Statistic RV Percent Paced: 1 %
Date Time Interrogation Session: 20200709072148
HighPow Impedance: 110 Ohm
Implantable Lead Implant Date: 20121107
Implantable Lead Implant Date: 20121107
Implantable Lead Location: 753859
Implantable Lead Location: 753860
Implantable Lead Model: 7000
Implantable Pulse Generator Implant Date: 20121107
Lead Channel Impedance Value: 310 Ohm
Lead Channel Impedance Value: 580 Ohm
Lead Channel Sensing Intrinsic Amplitude: 11.5 mV
Lead Channel Sensing Intrinsic Amplitude: 3.8 mV
Lead Channel Setting Pacing Amplitude: 2 V
Lead Channel Setting Pacing Amplitude: 2.5 V
Lead Channel Setting Pacing Pulse Width: 0.7 ms
Lead Channel Setting Sensing Sensitivity: 0.5 mV
Pulse Gen Serial Number: 1033795

## 2018-07-31 DIAGNOSIS — I5022 Chronic systolic (congestive) heart failure: Secondary | ICD-10-CM | POA: Diagnosis not present

## 2018-07-31 DIAGNOSIS — Z452 Encounter for adjustment and management of vascular access device: Secondary | ICD-10-CM | POA: Diagnosis not present

## 2018-07-31 DIAGNOSIS — I130 Hypertensive heart and chronic kidney disease with heart failure and stage 1 through stage 4 chronic kidney disease, or unspecified chronic kidney disease: Secondary | ICD-10-CM | POA: Diagnosis not present

## 2018-07-31 DIAGNOSIS — I255 Ischemic cardiomyopathy: Secondary | ICD-10-CM | POA: Diagnosis not present

## 2018-07-31 DIAGNOSIS — N183 Chronic kidney disease, stage 3 (moderate): Secondary | ICD-10-CM | POA: Diagnosis not present

## 2018-07-31 DIAGNOSIS — I25110 Atherosclerotic heart disease of native coronary artery with unstable angina pectoris: Secondary | ICD-10-CM | POA: Diagnosis not present

## 2018-07-31 NOTE — Progress Notes (Signed)
Remote ICD transmission.   

## 2018-08-01 ENCOUNTER — Telehealth (HOSPITAL_COMMUNITY): Payer: Self-pay | Admitting: Cardiology

## 2018-08-01 NOTE — Telephone Encounter (Signed)
Abnormal labs received from [place] drawn 07/31/18 Cr 2.77 BUN 46 Na 136 K 4.4   Per VO Dr Haroldine Laws Recheck x 2 weeks   Order sent to [facility] for repeat labs x 2 weeks

## 2018-08-02 ENCOUNTER — Telehealth (HOSPITAL_COMMUNITY): Payer: Self-pay

## 2018-08-02 NOTE — Telephone Encounter (Signed)
Plan of care signed, to be picked up by Linna Hoff of [facility]

## 2018-08-06 ENCOUNTER — Other Ambulatory Visit (HOSPITAL_COMMUNITY): Payer: Self-pay | Admitting: Internal Medicine

## 2018-08-07 DIAGNOSIS — Z452 Encounter for adjustment and management of vascular access device: Secondary | ICD-10-CM | POA: Diagnosis not present

## 2018-08-07 DIAGNOSIS — I255 Ischemic cardiomyopathy: Secondary | ICD-10-CM | POA: Diagnosis not present

## 2018-08-07 DIAGNOSIS — N183 Chronic kidney disease, stage 3 (moderate): Secondary | ICD-10-CM | POA: Diagnosis not present

## 2018-08-07 DIAGNOSIS — I5022 Chronic systolic (congestive) heart failure: Secondary | ICD-10-CM | POA: Diagnosis not present

## 2018-08-07 DIAGNOSIS — I130 Hypertensive heart and chronic kidney disease with heart failure and stage 1 through stage 4 chronic kidney disease, or unspecified chronic kidney disease: Secondary | ICD-10-CM | POA: Diagnosis not present

## 2018-08-07 DIAGNOSIS — I25110 Atherosclerotic heart disease of native coronary artery with unstable angina pectoris: Secondary | ICD-10-CM | POA: Diagnosis not present

## 2018-08-14 DIAGNOSIS — N183 Chronic kidney disease, stage 3 (moderate): Secondary | ICD-10-CM | POA: Diagnosis not present

## 2018-08-14 DIAGNOSIS — I5022 Chronic systolic (congestive) heart failure: Secondary | ICD-10-CM | POA: Diagnosis not present

## 2018-08-15 DIAGNOSIS — I10 Essential (primary) hypertension: Secondary | ICD-10-CM | POA: Diagnosis not present

## 2018-08-15 DIAGNOSIS — I5022 Chronic systolic (congestive) heart failure: Secondary | ICD-10-CM | POA: Diagnosis not present

## 2018-08-15 DIAGNOSIS — Z7682 Awaiting organ transplant status: Secondary | ICD-10-CM | POA: Diagnosis not present

## 2018-08-15 DIAGNOSIS — N183 Chronic kidney disease, stage 3 (moderate): Secondary | ICD-10-CM | POA: Diagnosis not present

## 2018-08-16 ENCOUNTER — Other Ambulatory Visit (HOSPITAL_COMMUNITY): Payer: Self-pay | Admitting: Internal Medicine

## 2018-08-19 DIAGNOSIS — E785 Hyperlipidemia, unspecified: Secondary | ICD-10-CM | POA: Diagnosis not present

## 2018-08-19 DIAGNOSIS — Z950 Presence of cardiac pacemaker: Secondary | ICD-10-CM | POA: Diagnosis not present

## 2018-08-19 DIAGNOSIS — I5022 Chronic systolic (congestive) heart failure: Secondary | ICD-10-CM | POA: Diagnosis not present

## 2018-08-19 DIAGNOSIS — I130 Hypertensive heart and chronic kidney disease with heart failure and stage 1 through stage 4 chronic kidney disease, or unspecified chronic kidney disease: Secondary | ICD-10-CM | POA: Diagnosis not present

## 2018-08-19 DIAGNOSIS — I252 Old myocardial infarction: Secondary | ICD-10-CM | POA: Diagnosis not present

## 2018-08-19 DIAGNOSIS — Z7901 Long term (current) use of anticoagulants: Secondary | ICD-10-CM | POA: Diagnosis not present

## 2018-08-19 DIAGNOSIS — I255 Ischemic cardiomyopathy: Secondary | ICD-10-CM | POA: Diagnosis not present

## 2018-08-19 DIAGNOSIS — N183 Chronic kidney disease, stage 3 (moderate): Secondary | ICD-10-CM | POA: Diagnosis not present

## 2018-08-19 DIAGNOSIS — Z955 Presence of coronary angioplasty implant and graft: Secondary | ICD-10-CM | POA: Diagnosis not present

## 2018-08-19 DIAGNOSIS — I25110 Atherosclerotic heart disease of native coronary artery with unstable angina pectoris: Secondary | ICD-10-CM | POA: Diagnosis not present

## 2018-08-19 DIAGNOSIS — Z5181 Encounter for therapeutic drug level monitoring: Secondary | ICD-10-CM | POA: Diagnosis not present

## 2018-08-19 DIAGNOSIS — Z7982 Long term (current) use of aspirin: Secondary | ICD-10-CM | POA: Diagnosis not present

## 2018-08-19 DIAGNOSIS — Z452 Encounter for adjustment and management of vascular access device: Secondary | ICD-10-CM | POA: Diagnosis not present

## 2018-08-19 DIAGNOSIS — Z79899 Other long term (current) drug therapy: Secondary | ICD-10-CM | POA: Diagnosis not present

## 2018-08-20 NOTE — Progress Notes (Signed)
Heart Failure Clinic Date:  08/21/2018   ID:  Marvin Martin, DOB [birth year], MRN [id number]  Location: Home  Provider location: [facility] Type of Visit: Established patient  PCP:  Charlott Rakes, MD  Cardiologist:  No primary care provider on file. Primary HF: [place]  Chief Complaint: Heart Failure follow-up   History of Present Illness:  Marvin Martin is a 53 y.o. male  with history of CAD, ICM, MI X4  with 17 stents, chronic systolic HF with EF 61% s/p St. Jude ICD, HTN, gout, and hyperlipidemia.  He relocated from [facility] from [place] several years ago. His medical care was provided by [facility]. He has struggled with in-stent restenosis.n.   Admitted June 5th through June 14th, 6073 with A/C systolic CHF. Had repeat R/LHC as below with stable CAD and low output. Started on milrinone and weaned to 0.125 mcg/kg/min for d/c. Coreg, lasix, and spiro stopped.  Entresto dropped from full dose to 24/26 mg, subsequently stopped.   He was seen by Dr Mosetta Pigeon in July. Once he achieves fundraising goal he will hopefully be listed for heart/kidney.   Today he returns for HF follow up. Trying to raise money for heart/kidney transOverall feeling fine. SOB with steps. SOB after walking 1/2 block. Denies PND/Orthopnea. Appetite ok. No fever or chills. Weight at home 156 pounds. Taking all medications. No ICD issues. No issues with PICC.   Adriann Ballweg denies symptoms worrisome for COVID 19.   Past Medical History:  Diagnosis Date  . Chronic systolic CHF (congestive heart failure) ([place])    a. 01/2015 Echo: EF 20-25%, antlat, inflat AK.  [name] CKD (chronic kidney disease), stage III ([place])   . Coronary artery disease    a. s/p MI x 4;  b. Reported h/o 17 stents with recurrent ISR;  c. 02/2014 Cath ([place]): PCI to unknown vessel;  d. 01/2015 MV: EF 18%, large scar, no ischemia; e. 03/2015 PCI: LM nl, LAD nl, D2 patent stent, RI patent stent, LCX patent  stents p/m, RCA 95p ISR (PTCA), 40m, 100d into RPL CTO, RPDA 90 (2.5x16 Synergy DES).  . Hyperlipidemia   . Hypertensive heart disease   . Ischemic cardiomyopathy    a. s/p SJM ICD 2007 w/ gen change in 2012; b. 12/2014 CPX Test: mild to mod HF limitation w/ pVO2 20.3 and nl slope;  c. 01/2015 Echo: EF 20-25%.   Past Surgical History:  Procedure Laterality Date  . 17 stints placements Bilateral   . CARDIAC CATHETERIZATION     Most recent 02/2014   . CARDIAC CATHETERIZATION N/A 03/31/2015   Procedure: Left Heart Cath and Coronary Angiography;  Surgeon: Troy Sine, MD;  Location: [place] CV LAB;  Service: Cardiovascular;  Laterality: N/A;  . CARDIAC CATHETERIZATION N/A 03/31/2015   Procedure: Coronary Stent Intervention;  Surgeon: Troy Sine, MD;  Location: [place] CV LAB;  Service: Cardiovascular;  Laterality: N/A;  . CARDIAC CATHETERIZATION N/A 05/27/2015   Procedure: Right/Left Heart Cath and Coronary Angiography;  Surgeon: Jolaine Artist, MD;  Location: [place] CV LAB;  Service: Cardiovascular;  Laterality: N/A;  . CARDIAC CATHETERIZATION N/A 06/23/2015   Procedure: Right/Left Heart Cath and Coronary Angiography;  Surgeon: Jolaine Artist, MD;  Location: [place] CV LAB;  Service: Cardiovascular;  Laterality: N/A;  . INSERT / REPLACE / REMOVE PACEMAKER     St jude 2006  Generator Change 2012   . PACEMAKER GENERATOR CHANGE Bilateral  Current Outpatient Medications  Medication Sig Dispense Refill  . Alirocumab (PRALUENT) 75 MG/ML SOAJ Inject 75 mg into the skin every 14 (fourteen) days. 6 pen 3  . allopurinol (ZYLOPRIM) 100 MG tablet TAKE 1 TABLET BY MOUTH TWICE A DAY 180 tablet 2  . amLODipine (NORVASC) 10 MG tablet TAKE 1 TABLET BY MOUTH EVERY DAY 90 tablet 1  . aspirin 81 MG tablet Take 81 mg by mouth daily.    [name] BRILINTA 90 MG TABS tablet TAKE 1 TABLET BY MOUTH TWICE A DAY 180 tablet 1  . cholecalciferol (VITAMIN D) 1000 UNITS tablet Take 1,000 Units by  mouth daily.    . CORLANOR 7.5 MG TABS tablet TAKE 1 TABLET (7.5 MG TOTAL) BY MOUTH 2 (TWO) TIMES DAILY WITH A MEAL. 180 tablet 3  . ENTRESTO 49-51 MG TAKE 1 TABLET BY MOUTH TWICE A DAY 60 tablet 5  . ezetimibe (ZETIA) 10 MG tablet TAKE 1 TABLET BY MOUTH EVERY DAY 90 tablet 1  . hydrALAZINE (APRESOLINE) 50 MG tablet TAKE 1 TABLET BY MOUTH THREE TIMES A DAY 270 tablet 1  . HYDROcodone-acetaminophen (NORCO/VICODIN) 5-325 MG tablet Take 1 tablet by mouth every 4 (four) hours as needed. 10 tablet 0  . Magnesium Oxide 200 MG TABS Take 2 tablets (400 mg total) by mouth 2 (two) times daily. 120 tablet 3  . nitroGLYCERIN (NITROSTAT) 0.4 MG SL tablet Place 1 tablet (0.4 mg total) under the tongue every 5 (five) minutes as needed for chest pain. Reported on 04/12/2015 60 tablet 1  . omega-3 acid ethyl esters (LOVAZA) 1 G capsule Take 1 g by mouth daily.     . ondansetron (ZOFRAN) 4 MG tablet Take 1 tablet (4 mg total) every 8 (eight) hours as needed by mouth for nausea or vomiting. Reported on 05/24/2015 30 tablet 1  . rosuvastatin (CRESTOR) 40 MG tablet TAKE 1 TABLET BY MOUTH EVERY DAY 30 tablet 11   No current facility-administered medications for this encounter.    Allergies:   Patient has no known allergies.   Social History:  The patient  reports that he has never smoked. He has never used smokeless tobacco. He reports that he does not drink alcohol or use drugs.   Family History:  The patient's family history includes CAD in his sister; Cancer in his mother; Hyperlipidemia in his father; Hypertension in his mother.   ROS:  Please see the history of present illness.   All other systems are personally reviewed and negative.  Vitals:   08/21/18 1018  BP: 138/77  Pulse: 83  SpO2: 99%    Exam:   General:  Well appearing. No resp difficulty HEENT: normal Neck: supple. no JVD. Carotids 2+ bilat; no bruits. No lymphadenopathy or thryomegaly appreciated. Cor: PMI nondisplaced. Regular rate & rhythm.  No rubs, gallops or murmurs. Lungs: clear Abdomen: soft, nontender, nondistended. No hepatosplenomegaly. No bruits or masses. Good bowel sounds. Extremities: no cyanosis, clubbing, rash, edema. RUE PICC  Neuro: alert & orientedx3, cranial nerves grossly intact. moves all 4 extremities w/o difficulty. Affect pleasant  Recent Labs: 05/08/2018: ALT 16; BUN 26; Creatinine, Ser 2.05; Hemoglobin 15.0; Magnesium 2.0; Platelets 203; Potassium 4.5; Sodium 137  Personally reviewed   Wt Readings from Last 3 Encounters:  08/21/18 72.1 kg (159 lb)  02/18/18 71.4 kg (157 lb 6.4 oz)  02/08/18 72.3 kg (159 lb 6 oz)      ASSESSMENT AND PLAN:  1. Chronic Systolic Heart Failure - ICM.  EF 15-20% on  echo 3/18. RV ok. Has ST Jude ICD - Pt who previously had NYHA IV symptoms, now stable with NYHA III on chronic milrinone at 0.125 mcg/kg/min via PICC. He has tolerated PICC line in RUE, not tunneled. He has not tolerated attempts to wean further.  - Echo 2/20 shows EF remains depressed at 15-20%. -NYHA IIIb. Functional seems to have declined. - Continue prn lasix - has not needed - No BB low output. - Continue corlanor 7.5  mg BID - Continue entresto 49/51 mg BID. Follow renal function  - Continue hydralazine 50 mg TID. - Continue imdur 30 mg daily.  - He understands milrinone will not last forever.  -He will follow with Dr Mosetta Pigeon every 4 months.   2. CAD- MI X4 17 stents  Most recent [place] 06/2015 - no intervention continued medical management.    Dist RCA-1 lesion, 100% stenosed. The lesion was previously treated with a stent (unknown type).  Dist RCA-2 lesion, 100% stenosed. The lesion was previously treated with a stent (unknown type).  Prox RCA lesion, 70% stenosed. The lesion was previously treated with a stent (unknown type) and angioplasty.  Mid RCA lesion, 50% stenosed. The lesion was previously treated with a stent (unknown type).  Ost RPDA to RPDA lesion, 75% stenosed. The lesion was  previously treated with a drug-eluting stent.  Mid LAD lesion, 70% stenosed.  Ost 2nd Diag lesion, 70% stenosed.  - No chest pain.  - No BB with low output on home milrinone. - Continue daily crestor.   3. HTN - Stable. Meds as above.   4. Hyperlipidemia - On Praulent and statin. No change.   5. CKD stage IV  - Creatinine base 2.2-2.4. Continue to follow closely via [place]. - If creatinine worsens will need to stop entresto - He has been seen at [facility] for heart/kidney transplant.   5. Gross hematuria Resolved.   Refer to HFSW for possible fund raising options for transplant.   Follow up in 2 months   Signed, Darrick Grinder, NP  08/21/2018 11:05 AM  Advanced Heart Failure [place] and [place] [address] [phone] (office) [phone] (fax)

## 2018-08-21 ENCOUNTER — Ambulatory Visit (HOSPITAL_COMMUNITY)
Admission: RE | Admit: 2018-08-21 | Discharge: 2018-08-21 | Disposition: A | Discharge status: Home / Self Care | Payer: Medicare Other | Source: Ambulatory Visit | Attending: Cardiology | Admitting: Cardiology

## 2018-08-21 ENCOUNTER — Other Ambulatory Visit: Payer: Self-pay

## 2018-08-21 ENCOUNTER — Encounter (HOSPITAL_COMMUNITY): Payer: Self-pay

## 2018-08-21 VITALS — BP 138/77 | HR 83 | Wt 159.0 lb

## 2018-08-21 DIAGNOSIS — I5022 Chronic systolic (congestive) heart failure: Secondary | ICD-10-CM | POA: Diagnosis not present

## 2018-08-21 DIAGNOSIS — I2510 Atherosclerotic heart disease of native coronary artery without angina pectoris: Secondary | ICD-10-CM | POA: Insufficient documentation

## 2018-08-21 DIAGNOSIS — N183 Chronic kidney disease, stage 3 unspecified: Secondary | ICD-10-CM

## 2018-08-21 DIAGNOSIS — Z955 Presence of coronary angioplasty implant and graft: Secondary | ICD-10-CM | POA: Insufficient documentation

## 2018-08-21 DIAGNOSIS — Z7982 Long term (current) use of aspirin: Secondary | ICD-10-CM | POA: Insufficient documentation

## 2018-08-21 DIAGNOSIS — Z95810 Presence of automatic (implantable) cardiac defibrillator: Secondary | ICD-10-CM | POA: Diagnosis not present

## 2018-08-21 DIAGNOSIS — I130 Hypertensive heart and chronic kidney disease with heart failure and stage 1 through stage 4 chronic kidney disease, or unspecified chronic kidney disease: Secondary | ICD-10-CM | POA: Diagnosis not present

## 2018-08-21 DIAGNOSIS — I509 Heart failure, unspecified: Secondary | ICD-10-CM | POA: Diagnosis present

## 2018-08-21 DIAGNOSIS — M109 Gout, unspecified: Secondary | ICD-10-CM | POA: Insufficient documentation

## 2018-08-21 DIAGNOSIS — N184 Chronic kidney disease, stage 4 (severe): Secondary | ICD-10-CM | POA: Diagnosis not present

## 2018-08-21 DIAGNOSIS — I255 Ischemic cardiomyopathy: Secondary | ICD-10-CM | POA: Diagnosis not present

## 2018-08-21 DIAGNOSIS — Z8249 Family history of ischemic heart disease and other diseases of the circulatory system: Secondary | ICD-10-CM | POA: Diagnosis not present

## 2018-08-21 DIAGNOSIS — I252 Old myocardial infarction: Secondary | ICD-10-CM | POA: Insufficient documentation

## 2018-08-21 DIAGNOSIS — E785 Hyperlipidemia, unspecified: Secondary | ICD-10-CM | POA: Diagnosis not present

## 2018-08-21 DIAGNOSIS — I25110 Atherosclerotic heart disease of native coronary artery with unstable angina pectoris: Secondary | ICD-10-CM | POA: Diagnosis not present

## 2018-08-21 DIAGNOSIS — Z79899 Other long term (current) drug therapy: Secondary | ICD-10-CM | POA: Insufficient documentation

## 2018-08-21 DIAGNOSIS — I110 Hypertensive heart disease with heart failure: Secondary | ICD-10-CM | POA: Diagnosis not present

## 2018-08-21 NOTE — Progress Notes (Signed)
CSW referred to assist patient with resources for funding raising for transplant. CSW provided patient with information on [place] for Transplants. Patient acknowledged that he does not have a computer but he could borrow from his son. CSW explained the need for computer access to fund raise. Patient will explore options and call if needed. CSW available as needed. Raquel Sarna, [place], [place]

## 2018-08-21 NOTE — Addendum Note (Signed)
Encounter addended by: Louann Liv, LCSW on: 08/21/2018 2:03 PM  Actions taken: Clinical Note Signed

## 2018-08-21 NOTE — Addendum Note (Signed)
Encounter addended by: Marlise Eves, RN on: 08/21/2018 11:32 AM  Actions taken: Clinical Note Signed

## 2018-08-21 NOTE — Patient Instructions (Signed)
You have been referred to our heart failure social worker. She will be in contact with you.  Please follow up with the [facility] in 2 months.  At the [facility], you and your health needs are our priority. As part of our continuing mission to provide you with exceptional heart care, we have created designated Provider Care Teams. These Care Teams include your primary Cardiologist (physician) and Advanced Practice Providers (APPs- Physician Assistants and Nurse Practitioners) who all work together to provide you with the care you need, when you need it.   You may see any of the following providers on your designated Care Team at your next follow up: [name] Dr Glori Bickers . Dr Loralie Champagne . Darrick Grinder, NP   Please be sure to bring in all your medications bottles to every appointment.

## 2018-08-22 ENCOUNTER — Encounter (HOSPITAL_COMMUNITY): Payer: Medicare Other

## 2018-08-28 DIAGNOSIS — I5022 Chronic systolic (congestive) heart failure: Secondary | ICD-10-CM | POA: Diagnosis not present

## 2018-08-28 DIAGNOSIS — I130 Hypertensive heart and chronic kidney disease with heart failure and stage 1 through stage 4 chronic kidney disease, or unspecified chronic kidney disease: Secondary | ICD-10-CM | POA: Diagnosis not present

## 2018-08-28 DIAGNOSIS — I252 Old myocardial infarction: Secondary | ICD-10-CM | POA: Diagnosis not present

## 2018-08-28 DIAGNOSIS — N183 Chronic kidney disease, stage 3 (moderate): Secondary | ICD-10-CM | POA: Diagnosis not present

## 2018-08-28 DIAGNOSIS — I25110 Atherosclerotic heart disease of native coronary artery with unstable angina pectoris: Secondary | ICD-10-CM | POA: Diagnosis not present

## 2018-08-28 DIAGNOSIS — I255 Ischemic cardiomyopathy: Secondary | ICD-10-CM | POA: Diagnosis not present

## 2018-09-04 DIAGNOSIS — I255 Ischemic cardiomyopathy: Secondary | ICD-10-CM | POA: Diagnosis not present

## 2018-09-04 DIAGNOSIS — I130 Hypertensive heart and chronic kidney disease with heart failure and stage 1 through stage 4 chronic kidney disease, or unspecified chronic kidney disease: Secondary | ICD-10-CM | POA: Diagnosis not present

## 2018-09-04 DIAGNOSIS — I252 Old myocardial infarction: Secondary | ICD-10-CM | POA: Diagnosis not present

## 2018-09-04 DIAGNOSIS — N183 Chronic kidney disease, stage 3 (moderate): Secondary | ICD-10-CM | POA: Diagnosis not present

## 2018-09-04 DIAGNOSIS — I5022 Chronic systolic (congestive) heart failure: Secondary | ICD-10-CM | POA: Diagnosis not present

## 2018-09-04 DIAGNOSIS — I25110 Atherosclerotic heart disease of native coronary artery with unstable angina pectoris: Secondary | ICD-10-CM | POA: Diagnosis not present

## 2018-09-11 DIAGNOSIS — I25110 Atherosclerotic heart disease of native coronary artery with unstable angina pectoris: Secondary | ICD-10-CM | POA: Diagnosis not present

## 2018-09-11 DIAGNOSIS — N183 Chronic kidney disease, stage 3 (moderate): Secondary | ICD-10-CM | POA: Diagnosis not present

## 2018-09-11 DIAGNOSIS — I5022 Chronic systolic (congestive) heart failure: Secondary | ICD-10-CM | POA: Diagnosis not present

## 2018-09-11 DIAGNOSIS — I130 Hypertensive heart and chronic kidney disease with heart failure and stage 1 through stage 4 chronic kidney disease, or unspecified chronic kidney disease: Secondary | ICD-10-CM | POA: Diagnosis not present

## 2018-09-11 DIAGNOSIS — I255 Ischemic cardiomyopathy: Secondary | ICD-10-CM | POA: Diagnosis not present

## 2018-09-11 DIAGNOSIS — I252 Old myocardial infarction: Secondary | ICD-10-CM | POA: Diagnosis not present

## 2018-09-17 ENCOUNTER — Other Ambulatory Visit (HOSPITAL_COMMUNITY): Payer: Self-pay | Admitting: Internal Medicine

## 2018-09-18 DIAGNOSIS — I25110 Atherosclerotic heart disease of native coronary artery with unstable angina pectoris: Secondary | ICD-10-CM | POA: Diagnosis not present

## 2018-09-18 DIAGNOSIS — Z7901 Long term (current) use of anticoagulants: Secondary | ICD-10-CM | POA: Diagnosis not present

## 2018-09-18 DIAGNOSIS — I130 Hypertensive heart and chronic kidney disease with heart failure and stage 1 through stage 4 chronic kidney disease, or unspecified chronic kidney disease: Secondary | ICD-10-CM | POA: Diagnosis not present

## 2018-09-18 DIAGNOSIS — Z79899 Other long term (current) drug therapy: Secondary | ICD-10-CM | POA: Diagnosis not present

## 2018-09-18 DIAGNOSIS — Z5181 Encounter for therapeutic drug level monitoring: Secondary | ICD-10-CM | POA: Diagnosis not present

## 2018-09-18 DIAGNOSIS — I252 Old myocardial infarction: Secondary | ICD-10-CM | POA: Diagnosis not present

## 2018-09-18 DIAGNOSIS — N183 Chronic kidney disease, stage 3 (moderate): Secondary | ICD-10-CM | POA: Diagnosis not present

## 2018-09-18 DIAGNOSIS — I255 Ischemic cardiomyopathy: Secondary | ICD-10-CM | POA: Diagnosis not present

## 2018-09-18 DIAGNOSIS — Z7982 Long term (current) use of aspirin: Secondary | ICD-10-CM | POA: Diagnosis not present

## 2018-09-18 DIAGNOSIS — Z950 Presence of cardiac pacemaker: Secondary | ICD-10-CM | POA: Diagnosis not present

## 2018-09-18 DIAGNOSIS — Z955 Presence of coronary angioplasty implant and graft: Secondary | ICD-10-CM | POA: Diagnosis not present

## 2018-09-18 DIAGNOSIS — E785 Hyperlipidemia, unspecified: Secondary | ICD-10-CM | POA: Diagnosis not present

## 2018-09-18 DIAGNOSIS — I5022 Chronic systolic (congestive) heart failure: Secondary | ICD-10-CM | POA: Diagnosis not present

## 2018-09-18 DIAGNOSIS — Z452 Encounter for adjustment and management of vascular access device: Secondary | ICD-10-CM | POA: Diagnosis not present

## 2018-09-20 ENCOUNTER — Other Ambulatory Visit (HOSPITAL_COMMUNITY): Payer: Self-pay | Admitting: Internal Medicine

## 2018-09-25 DIAGNOSIS — I255 Ischemic cardiomyopathy: Secondary | ICD-10-CM | POA: Diagnosis not present

## 2018-09-25 DIAGNOSIS — I252 Old myocardial infarction: Secondary | ICD-10-CM | POA: Diagnosis not present

## 2018-09-25 DIAGNOSIS — I25110 Atherosclerotic heart disease of native coronary artery with unstable angina pectoris: Secondary | ICD-10-CM | POA: Diagnosis not present

## 2018-09-25 DIAGNOSIS — I130 Hypertensive heart and chronic kidney disease with heart failure and stage 1 through stage 4 chronic kidney disease, or unspecified chronic kidney disease: Secondary | ICD-10-CM | POA: Diagnosis not present

## 2018-09-25 DIAGNOSIS — I5022 Chronic systolic (congestive) heart failure: Secondary | ICD-10-CM | POA: Diagnosis not present

## 2018-09-25 DIAGNOSIS — N183 Chronic kidney disease, stage 3 (moderate): Secondary | ICD-10-CM | POA: Diagnosis not present

## 2018-10-01 ENCOUNTER — Other Ambulatory Visit (HOSPITAL_COMMUNITY): Payer: Self-pay | Admitting: Internal Medicine

## 2018-10-02 DIAGNOSIS — I255 Ischemic cardiomyopathy: Secondary | ICD-10-CM | POA: Diagnosis not present

## 2018-10-02 DIAGNOSIS — I130 Hypertensive heart and chronic kidney disease with heart failure and stage 1 through stage 4 chronic kidney disease, or unspecified chronic kidney disease: Secondary | ICD-10-CM | POA: Diagnosis not present

## 2018-10-02 DIAGNOSIS — I5022 Chronic systolic (congestive) heart failure: Secondary | ICD-10-CM | POA: Diagnosis not present

## 2018-10-02 DIAGNOSIS — I252 Old myocardial infarction: Secondary | ICD-10-CM | POA: Diagnosis not present

## 2018-10-02 DIAGNOSIS — N183 Chronic kidney disease, stage 3 (moderate): Secondary | ICD-10-CM | POA: Diagnosis not present

## 2018-10-02 DIAGNOSIS — I25110 Atherosclerotic heart disease of native coronary artery with unstable angina pectoris: Secondary | ICD-10-CM | POA: Diagnosis not present

## 2018-10-03 DIAGNOSIS — N183 Chronic kidney disease, stage 3 (moderate): Secondary | ICD-10-CM | POA: Diagnosis not present

## 2018-10-03 DIAGNOSIS — N2581 Secondary hyperparathyroidism of renal origin: Secondary | ICD-10-CM | POA: Diagnosis not present

## 2018-10-03 DIAGNOSIS — I129 Hypertensive chronic kidney disease with stage 1 through stage 4 chronic kidney disease, or unspecified chronic kidney disease: Secondary | ICD-10-CM | POA: Diagnosis not present

## 2018-10-03 DIAGNOSIS — E785 Hyperlipidemia, unspecified: Secondary | ICD-10-CM | POA: Diagnosis not present

## 2018-10-03 DIAGNOSIS — I509 Heart failure, unspecified: Secondary | ICD-10-CM | POA: Diagnosis not present

## 2018-10-09 DIAGNOSIS — I5022 Chronic systolic (congestive) heart failure: Secondary | ICD-10-CM | POA: Diagnosis not present

## 2018-10-09 DIAGNOSIS — I252 Old myocardial infarction: Secondary | ICD-10-CM | POA: Diagnosis not present

## 2018-10-09 DIAGNOSIS — N183 Chronic kidney disease, stage 3 (moderate): Secondary | ICD-10-CM | POA: Diagnosis not present

## 2018-10-09 DIAGNOSIS — I25110 Atherosclerotic heart disease of native coronary artery with unstable angina pectoris: Secondary | ICD-10-CM | POA: Diagnosis not present

## 2018-10-09 DIAGNOSIS — I130 Hypertensive heart and chronic kidney disease with heart failure and stage 1 through stage 4 chronic kidney disease, or unspecified chronic kidney disease: Secondary | ICD-10-CM | POA: Diagnosis not present

## 2018-10-09 DIAGNOSIS — I255 Ischemic cardiomyopathy: Secondary | ICD-10-CM | POA: Diagnosis not present

## 2018-10-09 DIAGNOSIS — Z5181 Encounter for therapeutic drug level monitoring: Secondary | ICD-10-CM | POA: Diagnosis not present

## 2018-10-10 ENCOUNTER — Telehealth (HOSPITAL_COMMUNITY): Payer: Self-pay | Admitting: Cardiology

## 2018-10-10 NOTE — Telephone Encounter (Signed)
Abnormal labs received from [place] drawn 10/09/18 K 4.9 Cr 2.63 BUN 46  Per Amy Clegg,NP Stop entresto Increase hydralazine to 75 mg, TID Increase imdur to 60 mg daily  Repeat BMET in 7 days,   Attempted to contact patient no answer unable to leave message

## 2018-10-14 MED ORDER — HYDRALAZINE HCL 50 MG PO TABS: 75.0000 mg | ORAL_TABLET | Freq: Three times a day (TID) | ORAL | 1 refills | Status: DC

## 2018-10-14 MED ORDER — ISOSORBIDE MONONITRATE ER 30 MG PO TB24: 30.0000 mg | ORAL_TABLET | Freq: Every day | ORAL | 3 refills | Status: DC

## 2018-10-14 NOTE — Telephone Encounter (Signed)
Patient aware and voiced understanding, patient reports he was not on imdur previously so will start 30 mg daily per Amy Clegg,NP   ahc will repeat labs in one week

## 2018-10-16 DIAGNOSIS — I130 Hypertensive heart and chronic kidney disease with heart failure and stage 1 through stage 4 chronic kidney disease, or unspecified chronic kidney disease: Secondary | ICD-10-CM | POA: Diagnosis not present

## 2018-10-16 DIAGNOSIS — I255 Ischemic cardiomyopathy: Secondary | ICD-10-CM | POA: Diagnosis not present

## 2018-10-16 DIAGNOSIS — I5022 Chronic systolic (congestive) heart failure: Secondary | ICD-10-CM | POA: Diagnosis not present

## 2018-10-16 DIAGNOSIS — I252 Old myocardial infarction: Secondary | ICD-10-CM | POA: Diagnosis not present

## 2018-10-16 DIAGNOSIS — I25110 Atherosclerotic heart disease of native coronary artery with unstable angina pectoris: Secondary | ICD-10-CM | POA: Diagnosis not present

## 2018-10-16 DIAGNOSIS — N183 Chronic kidney disease, stage 3 (moderate): Secondary | ICD-10-CM | POA: Diagnosis not present

## 2018-10-16 DIAGNOSIS — I509 Heart failure, unspecified: Secondary | ICD-10-CM | POA: Diagnosis not present

## 2018-10-18 DIAGNOSIS — N183 Chronic kidney disease, stage 3 unspecified: Secondary | ICD-10-CM | POA: Diagnosis not present

## 2018-10-18 DIAGNOSIS — E785 Hyperlipidemia, unspecified: Secondary | ICD-10-CM | POA: Diagnosis not present

## 2018-10-18 DIAGNOSIS — Z950 Presence of cardiac pacemaker: Secondary | ICD-10-CM | POA: Diagnosis not present

## 2018-10-18 DIAGNOSIS — I255 Ischemic cardiomyopathy: Secondary | ICD-10-CM | POA: Diagnosis not present

## 2018-10-18 DIAGNOSIS — I130 Hypertensive heart and chronic kidney disease with heart failure and stage 1 through stage 4 chronic kidney disease, or unspecified chronic kidney disease: Secondary | ICD-10-CM | POA: Diagnosis not present

## 2018-10-18 DIAGNOSIS — Z955 Presence of coronary angioplasty implant and graft: Secondary | ICD-10-CM | POA: Diagnosis not present

## 2018-10-18 DIAGNOSIS — Z79899 Other long term (current) drug therapy: Secondary | ICD-10-CM | POA: Diagnosis not present

## 2018-10-18 DIAGNOSIS — Z5181 Encounter for therapeutic drug level monitoring: Secondary | ICD-10-CM | POA: Diagnosis not present

## 2018-10-18 DIAGNOSIS — I5022 Chronic systolic (congestive) heart failure: Secondary | ICD-10-CM | POA: Diagnosis not present

## 2018-10-18 DIAGNOSIS — Z7901 Long term (current) use of anticoagulants: Secondary | ICD-10-CM | POA: Diagnosis not present

## 2018-10-18 DIAGNOSIS — I252 Old myocardial infarction: Secondary | ICD-10-CM | POA: Diagnosis not present

## 2018-10-18 DIAGNOSIS — I25110 Atherosclerotic heart disease of native coronary artery with unstable angina pectoris: Secondary | ICD-10-CM | POA: Diagnosis not present

## 2018-10-18 DIAGNOSIS — Z7982 Long term (current) use of aspirin: Secondary | ICD-10-CM | POA: Diagnosis not present

## 2018-10-18 DIAGNOSIS — Z452 Encounter for adjustment and management of vascular access device: Secondary | ICD-10-CM | POA: Diagnosis not present

## 2018-10-21 ENCOUNTER — Other Ambulatory Visit (HOSPITAL_COMMUNITY): Payer: Self-pay | Admitting: Internal Medicine

## 2018-10-23 ENCOUNTER — Encounter (HOSPITAL_COMMUNITY): Payer: Self-pay | Admitting: Internal Medicine

## 2018-10-23 ENCOUNTER — Other Ambulatory Visit: Payer: Self-pay

## 2018-10-23 ENCOUNTER — Ambulatory Visit (HOSPITAL_COMMUNITY)
Admission: RE | Admit: 2018-10-23 | Discharge: 2018-10-23 | Disposition: A | Discharge status: Home / Self Care | Payer: Medicare Other | Source: Ambulatory Visit | Attending: Internal Medicine | Admitting: Internal Medicine

## 2018-10-23 VITALS — BP 155/86 | HR 93 | Wt 156.6 lb

## 2018-10-23 DIAGNOSIS — I110 Hypertensive heart disease with heart failure: Secondary | ICD-10-CM | POA: Diagnosis not present

## 2018-10-23 DIAGNOSIS — Z955 Presence of coronary angioplasty implant and graft: Secondary | ICD-10-CM | POA: Diagnosis not present

## 2018-10-23 DIAGNOSIS — I252 Old myocardial infarction: Secondary | ICD-10-CM | POA: Insufficient documentation

## 2018-10-23 DIAGNOSIS — N184 Chronic kidney disease, stage 4 (severe): Secondary | ICD-10-CM | POA: Insufficient documentation

## 2018-10-23 DIAGNOSIS — I130 Hypertensive heart and chronic kidney disease with heart failure and stage 1 through stage 4 chronic kidney disease, or unspecified chronic kidney disease: Secondary | ICD-10-CM | POA: Insufficient documentation

## 2018-10-23 DIAGNOSIS — I5022 Chronic systolic (congestive) heart failure: Secondary | ICD-10-CM | POA: Diagnosis not present

## 2018-10-23 DIAGNOSIS — Z8249 Family history of ischemic heart disease and other diseases of the circulatory system: Secondary | ICD-10-CM | POA: Insufficient documentation

## 2018-10-23 DIAGNOSIS — Z79899 Other long term (current) drug therapy: Secondary | ICD-10-CM | POA: Insufficient documentation

## 2018-10-23 DIAGNOSIS — M109 Gout, unspecified: Secondary | ICD-10-CM | POA: Diagnosis not present

## 2018-10-23 DIAGNOSIS — N183 Chronic kidney disease, stage 3 unspecified: Secondary | ICD-10-CM | POA: Diagnosis not present

## 2018-10-23 DIAGNOSIS — I255 Ischemic cardiomyopathy: Secondary | ICD-10-CM | POA: Diagnosis not present

## 2018-10-23 DIAGNOSIS — Z7982 Long term (current) use of aspirin: Secondary | ICD-10-CM | POA: Diagnosis not present

## 2018-10-23 DIAGNOSIS — Z95810 Presence of automatic (implantable) cardiac defibrillator: Secondary | ICD-10-CM | POA: Diagnosis not present

## 2018-10-23 DIAGNOSIS — I25110 Atherosclerotic heart disease of native coronary artery with unstable angina pectoris: Secondary | ICD-10-CM | POA: Diagnosis not present

## 2018-10-23 DIAGNOSIS — Z950 Presence of cardiac pacemaker: Secondary | ICD-10-CM | POA: Diagnosis not present

## 2018-10-23 DIAGNOSIS — E785 Hyperlipidemia, unspecified: Secondary | ICD-10-CM | POA: Diagnosis not present

## 2018-10-23 DIAGNOSIS — I2510 Atherosclerotic heart disease of native coronary artery without angina pectoris: Secondary | ICD-10-CM

## 2018-10-23 MED ORDER — HYDRALAZINE HCL 100 MG PO TABS: 100.0000 mg | ORAL_TABLET | Freq: Three times a day (TID) | ORAL | 3 refills | Status: DC

## 2018-10-23 NOTE — Patient Instructions (Signed)
INCREASE Hydralazine 100mg  (1 tab) three times a day  Your physician recommends that you schedule a follow-up appointment in: 4 months with Dr Haroldine Laws.  We will call you to schedule this appointment.   At the [facility], you and your health needs are our priority. As part of our continuing mission to provide you with exceptional heart care, we have created designated Provider Care Teams. These Care Teams include your primary Cardiologist (physician) and Advanced Practice Providers (APPs- Physician Assistants and Nurse Practitioners) who all work together to provide you with the care you need, when you need it.   You may see any of the following providers on your designated Care Team at your next follow up: [name] Dr Glori Bickers . Dr Loralie Champagne . Darrick Grinder, NP   Please be sure to bring in all your medications bottles to every appointment.

## 2018-10-23 NOTE — Progress Notes (Signed)
[facility] Date:  10/23/2018   ID:  Marvin Martin, DOB 1965/03/29, MRN [id number]  Location: Home  Provider location: [facility] Type of Visit: Established patient  PCP:  Charlott Rakes, MD  Cardiologist:  No primary care provider on file. Primary HF:   Chief Complaint: Heart Failure follow-up   History of Present Illness:  Marvin Martin is a 53 y.o. male  with history of CAD, ICM, MI X4  with 17 stents, chronic systolic HF with EF [id number] s/p St. Jude ICD, HTN, gout, and hyperlipidemia.  He relocated from [facility] from [place] several years ago. His medical care was provided by [facility]. He has struggled with in-stent restenosis.n.   Admitted June 5th through June 14th, 0000000 with A/C systolic CHF. Had repeat R/LHC as below with stable CAD and low output. Started on milrinone and weaned to 0.125 mcg/kg/min for d/c. Coreg, lasix, and spiro stopped.  Entresto dropped from full dose to 24/26 mg, subsequently stopped.   He was seen by Dr Mosetta Pigeon in July. Once he achieves fundraising goal he will hopefully be listed for heart/kidney.   Today he returns for HF follow up. Trying to raise money for heart/kidney transplant. Needs $2000. Remains on IV milrinone. Feels fine. Mild DOE. No CP. No edema, orthopnea or PND. SBP 120s at home. Compliant with meds.   Marvin Martin denies symptoms worrisome for COVID 19.   Past Medical History:  Diagnosis Date  . Chronic systolic CHF (congestive heart failure) ([place])    a. 01/2015 Echo: EF 20-25%, antlat, inflat AK.  Marvin Martin CKD (chronic kidney disease), stage III   . Coronary artery disease    a. s/p MI x 4;  b. Reported h/o 17 stents with recurrent ISR;  c. 02/2014 Cath ([place]): PCI to unknown vessel;  d. 01/2015 MV: EF 18%, large scar, no ischemia; e. 03/2015 PCI: LM nl, LAD nl, D2 patent stent, RI patent stent, LCX patent stents p/m, RCA 95p ISR (PTCA), 75m, 100d into RPL CTO, RPDA 90  (2.5x16 Synergy DES).  . Hyperlipidemia   . Hypertensive heart disease   . Ischemic cardiomyopathy    a. s/p SJM ICD 2007 w/ gen change in 2012; b. 12/2014 CPX Test: mild to mod HF limitation w/ pVO2 20.3 and nl slope;  c. 01/2015 Echo: EF 20-25%.   Past Surgical History:  Procedure Laterality Date  . 17 stints placements Bilateral   . CARDIAC CATHETERIZATION     Most recent 02/2014   . CARDIAC CATHETERIZATION N/A 03/31/2015   Procedure: Left Heart Cath and Coronary Angiography;  Surgeon: Troy Sine, MD;  Location: [place] [facility];  Service: Cardiovascular;  Laterality: N/A;  . CARDIAC CATHETERIZATION N/A 03/31/2015   Procedure: Coronary Stent Intervention;  Surgeon: Troy Sine, MD;  Location: [place] [facility];  Service: Cardiovascular;  Laterality: N/A;  . CARDIAC CATHETERIZATION N/A 05/27/2015   Procedure: Right/Left Heart Cath and Coronary Angiography;  Surgeon: Jolaine Artist, MD;  Location: [place] [facility];  Service: Cardiovascular;  Laterality: N/A;  . CARDIAC CATHETERIZATION N/A 06/23/2015   Procedure: Right/Left Heart Cath and Coronary Angiography;  Surgeon: Jolaine Artist, MD;  Location: [place] [facility];  Service: Cardiovascular;  Laterality: N/A;  . INSERT / REPLACE / REMOVE PACEMAKER     St jude 2006  Generator Change 2012   . PACEMAKER GENERATOR CHANGE Bilateral      Current Outpatient Medications  Medication  Sig Dispense Refill  . Alirocumab (PRALUENT) 75 MG/ML SOAJ Inject 75 mg into the skin every 14 (fourteen) days. 6 pen 3  . allopurinol (ZYLOPRIM) 100 MG tablet TAKE 1 TABLET BY MOUTH TWICE A DAY 180 tablet 2  . amLODipine (NORVASC) 10 MG tablet TAKE 1 TABLET BY MOUTH EVERY DAY 90 tablet 1  . aspirin 81 MG tablet Take 81 mg by mouth daily.    Marvin Martin BRILINTA 90 MG TABS tablet TAKE 1 TABLET BY MOUTH TWICE A DAY 180 tablet 1  . cholecalciferol (VITAMIN D) 1000 UNITS tablet Take 1,000 Units by mouth daily.    . CORLANOR 7.5 MG TABS tablet TAKE 1 TABLET  (7.5 MG TOTAL) BY MOUTH 2 (TWO) TIMES DAILY WITH A MEAL. 180 tablet 3  . ezetimibe (ZETIA) 10 MG tablet TAKE 1 TABLET BY MOUTH EVERY DAY 90 tablet 1  . hydrALAZINE (APRESOLINE) 50 MG tablet Take 1.5 tablets (75 mg total) by mouth 3 (three) times daily. 270 tablet 1  . HYDROcodone-acetaminophen (NORCO/VICODIN) 5-325 MG tablet Take 1 tablet by mouth every 4 (four) hours as needed. 10 tablet 0  . isosorbide mononitrate (IMDUR) 30 MG 24 hr tablet Take 1 tablet (30 mg total) by mouth daily. 90 tablet 3  . Magnesium Oxide 200 MG TABS Take 2 tablets (400 mg total) by mouth 2 (two) times daily. 120 tablet 3  . MILRINONE LACTATE IN DEXTROSE IV Inject into the vein.    . nitroGLYCERIN (NITROSTAT) 0.4 MG SL tablet Place 1 tablet (0.4 mg total) under the tongue every 5 (five) minutes as needed for chest pain. Reported on 04/12/2015 60 tablet 1  . omega-3 acid ethyl esters (LOVAZA) 1 G capsule Take 1 g by mouth daily.     . ondansetron (ZOFRAN) 4 MG tablet Take 1 tablet (4 mg total) every 8 (eight) hours as needed by mouth for nausea or vomiting. Reported on 05/24/2015 30 tablet 1  . rosuvastatin (CRESTOR) 40 MG tablet TAKE 1 TABLET BY MOUTH EVERY DAY 90 tablet 3   No current facility-administered medications for this encounter.    Allergies:   Patient has no known allergies.   Social History:  The patient  reports that he has never smoked. He has never used smokeless tobacco. He reports that he does not drink alcohol or use drugs.   Family History:  The patient's family history includes CAD in his sister; Cancer in his mother; Hyperlipidemia in his father; Hypertension in his mother.   ROS:  Please see the history of present illness.   All other systems are personally reviewed and negative.  Vitals:   10/23/18 1345  BP: (!) 155/86  Pulse: 93  SpO2: 100%    Exam:  General:  Well appearing. No resp difficulty HEENT: normal Neck: supple. no JVD. Carotids 2+ bilat; no bruits. No lymphadenopathy or  thryomegaly appreciated. Cor: PMI nondisplaced. Regular rate & rhythm. No rubs, gallops or murmurs. Lungs: clear Abdomen: soft, nontender, nondistended. No hepatosplenomegaly. No bruits or masses. Good bowel sounds. Extremities: no cyanosis, clubbing, rash, edema RUE PICC ok  Neuro: alert & orientedx3, cranial nerves grossly intact. moves all 4 extremities w/o difficulty. Affect pleasant    Recent Labs: 05/08/2018: ALT 16; BUN 26; Creatinine, Ser 2.05; Hemoglobin 15.0; Magnesium 2.0; Platelets 203; Potassium 4.5; Sodium 137  Personally reviewed   Wt Readings from Last 3 Encounters:  10/23/18 71 kg (156 lb 9.6 oz)  08/21/18 72.1 kg (159 lb)  02/18/18 71.4 kg (157 lb 6.4 oz)  ASSESSMENT AND PLAN:  1. Chronic Systolic Heart Failure - ICM.  EF 15-20% on echo 3/18. RV ok. Has ST Jude ICD - Pt who previously had NYHA IV symptoms, now stable with NYHA III on chronic milrinone at 0.125 mcg/kg/min via PICC. He has tolerated PICC line in RUE, not tunneled. He has not tolerated attempts to wean further.  - Echo 2/20 shows EF remains depressed at 15-20%. - Stable NYHA III on chronic milrinone support.  - Continue prn lasix - has not needed - No BB low output. - Continue corlanor 7.5  mg BID - Continue entresto 49/51 mg BID. Last creatinine 2.30 on 10/16/18 - Increase hydralazine to 100 mg TID. - Continue imdur 30 mg daily.  - Long discussion about how to start raising money for transplant goal (I.e. GoFundMe pages with FB, church, etc) - Continue to follow with Dr Mosetta Pigeon at [place] interrogated personally in [facility]. No VT/AF. Volume ok. Activity level ~4hr/day  2. CAD- MI X4 17 stents  - Most recent [place] 06/2015 - no intervention continued medical management.  - No current s/s of iscemia - No BB with low output on home milrinone. - Continue daily crestor.   3. HTN - Mildly elevated here. Stable at home.   4. Hyperlipidemia - On Praulent and statin. No change.   5. CKD stage IV   - Creatinine base 2.2-2.4. Continue to follow closely via [place]. Last Cr 2.03 - If creatinine worsens will need to stop entresto - He has been seen at [facility] for heart/kidney transplant.    Signed, Glori Bickers, MD  10/23/2018 2:01 PM  Advanced Heart Failure [place] [address] [facility] and [place] [address] [phone] (office) [phone] (fax)

## 2018-10-24 LAB — CUP PACEART REMOTE DEVICE CHECK
Battery Remaining Longevity: 7 mo
Battery Remaining Percentage: 8 %
Battery Voltage: 2.66 V
Brady Statistic AP VP Percent: 1 %
Brady Statistic AP VS Percent: 1.7 %
Brady Statistic AS VP Percent: 1 %
Brady Statistic AS VS Percent: 95 %
Brady Statistic RA Percent Paced: 1 %
Brady Statistic RV Percent Paced: 1 %
Date Time Interrogation Session: 20201008075506
HighPow Impedance: 109 Ohm
HighPow Impedance: 99 Ohm
Implantable Lead Implant Date: 20121107
Implantable Lead Implant Date: 20121107
Implantable Lead Location: 753859
Implantable Lead Location: 753860
Implantable Lead Model: 7000
Implantable Pulse Generator Implant Date: 20121107
Lead Channel Impedance Value: 300 Ohm
Lead Channel Impedance Value: 540 Ohm
Lead Channel Pacing Threshold Amplitude: 0.75 V
Lead Channel Pacing Threshold Amplitude: 0.75 V
Lead Channel Pacing Threshold Pulse Width: 0.5 ms
Lead Channel Pacing Threshold Pulse Width: 0.7 ms
Lead Channel Sensing Intrinsic Amplitude: 11.5 mV
Lead Channel Sensing Intrinsic Amplitude: 3.7 mV
Lead Channel Setting Pacing Amplitude: 2 V
Lead Channel Setting Pacing Amplitude: 2.5 V
Lead Channel Setting Pacing Pulse Width: 0.7 ms
Lead Channel Setting Sensing Sensitivity: 0.5 mV
Pulse Gen Serial Number: 1033795

## 2018-10-25 ENCOUNTER — Ambulatory Visit (INDEPENDENT_AMBULATORY_CARE_PROVIDER_SITE_OTHER): Payer: Medicare Other | Admitting: *Deleted

## 2018-10-25 DIAGNOSIS — I255 Ischemic cardiomyopathy: Secondary | ICD-10-CM

## 2018-10-25 DIAGNOSIS — I5022 Chronic systolic (congestive) heart failure: Secondary | ICD-10-CM

## 2018-10-30 DIAGNOSIS — I5022 Chronic systolic (congestive) heart failure: Secondary | ICD-10-CM | POA: Diagnosis not present

## 2018-10-30 DIAGNOSIS — I130 Hypertensive heart and chronic kidney disease with heart failure and stage 1 through stage 4 chronic kidney disease, or unspecified chronic kidney disease: Secondary | ICD-10-CM | POA: Diagnosis not present

## 2018-10-30 DIAGNOSIS — I252 Old myocardial infarction: Secondary | ICD-10-CM | POA: Diagnosis not present

## 2018-10-30 DIAGNOSIS — N183 Chronic kidney disease, stage 3 unspecified: Secondary | ICD-10-CM | POA: Diagnosis not present

## 2018-10-30 DIAGNOSIS — I255 Ischemic cardiomyopathy: Secondary | ICD-10-CM | POA: Diagnosis not present

## 2018-10-30 DIAGNOSIS — I25110 Atherosclerotic heart disease of native coronary artery with unstable angina pectoris: Secondary | ICD-10-CM | POA: Diagnosis not present

## 2018-10-30 DIAGNOSIS — I2510 Atherosclerotic heart disease of native coronary artery without angina pectoris: Secondary | ICD-10-CM | POA: Diagnosis not present

## 2018-10-31 ENCOUNTER — Telehealth (HOSPITAL_COMMUNITY): Payer: Self-pay | Admitting: Licensed Clinical Social Worker

## 2018-10-31 NOTE — Telephone Encounter (Signed)
Patient left message for CSW to inform that he was successful in raising the required monies for transplant. Patient unsure who to call to inform. CSW encouraged patient to return call to transplant center to inform as they were the center requesting the fundraising. Message left for patient. Raquel Sarna, [place], [place]

## 2018-11-04 NOTE — Progress Notes (Signed)
Remote ICD transmission.   

## 2018-11-05 ENCOUNTER — Other Ambulatory Visit (HOSPITAL_COMMUNITY): Payer: Self-pay | Admitting: Internal Medicine

## 2018-11-06 DIAGNOSIS — I25110 Atherosclerotic heart disease of native coronary artery with unstable angina pectoris: Secondary | ICD-10-CM | POA: Diagnosis not present

## 2018-11-06 DIAGNOSIS — I5022 Chronic systolic (congestive) heart failure: Secondary | ICD-10-CM | POA: Diagnosis not present

## 2018-11-06 DIAGNOSIS — N183 Chronic kidney disease, stage 3 unspecified: Secondary | ICD-10-CM | POA: Diagnosis not present

## 2018-11-06 DIAGNOSIS — I252 Old myocardial infarction: Secondary | ICD-10-CM | POA: Diagnosis not present

## 2018-11-06 DIAGNOSIS — I130 Hypertensive heart and chronic kidney disease with heart failure and stage 1 through stage 4 chronic kidney disease, or unspecified chronic kidney disease: Secondary | ICD-10-CM | POA: Diagnosis not present

## 2018-11-06 DIAGNOSIS — I255 Ischemic cardiomyopathy: Secondary | ICD-10-CM | POA: Diagnosis not present

## 2018-11-13 DIAGNOSIS — I252 Old myocardial infarction: Secondary | ICD-10-CM | POA: Diagnosis not present

## 2018-11-13 DIAGNOSIS — I5022 Chronic systolic (congestive) heart failure: Secondary | ICD-10-CM | POA: Diagnosis not present

## 2018-11-13 DIAGNOSIS — I255 Ischemic cardiomyopathy: Secondary | ICD-10-CM | POA: Diagnosis not present

## 2018-11-13 DIAGNOSIS — I25110 Atherosclerotic heart disease of native coronary artery with unstable angina pectoris: Secondary | ICD-10-CM | POA: Diagnosis not present

## 2018-11-13 DIAGNOSIS — I130 Hypertensive heart and chronic kidney disease with heart failure and stage 1 through stage 4 chronic kidney disease, or unspecified chronic kidney disease: Secondary | ICD-10-CM | POA: Diagnosis not present

## 2018-11-13 DIAGNOSIS — I2510 Atherosclerotic heart disease of native coronary artery without angina pectoris: Secondary | ICD-10-CM | POA: Diagnosis not present

## 2018-11-13 DIAGNOSIS — N183 Chronic kidney disease, stage 3 unspecified: Secondary | ICD-10-CM | POA: Diagnosis not present

## 2018-11-14 DIAGNOSIS — I5023 Acute on chronic systolic (congestive) heart failure: Secondary | ICD-10-CM | POA: Diagnosis not present

## 2018-11-14 DIAGNOSIS — I25110 Atherosclerotic heart disease of native coronary artery with unstable angina pectoris: Secondary | ICD-10-CM | POA: Diagnosis not present

## 2018-11-14 DIAGNOSIS — I5022 Chronic systolic (congestive) heart failure: Secondary | ICD-10-CM | POA: Diagnosis not present

## 2018-11-14 DIAGNOSIS — I130 Hypertensive heart and chronic kidney disease with heart failure and stage 1 through stage 4 chronic kidney disease, or unspecified chronic kidney disease: Secondary | ICD-10-CM | POA: Diagnosis not present

## 2018-11-14 DIAGNOSIS — N183 Chronic kidney disease, stage 3 unspecified: Secondary | ICD-10-CM | POA: Diagnosis not present

## 2018-11-14 DIAGNOSIS — I252 Old myocardial infarction: Secondary | ICD-10-CM | POA: Diagnosis not present

## 2018-11-14 DIAGNOSIS — I255 Ischemic cardiomyopathy: Secondary | ICD-10-CM | POA: Diagnosis not present

## 2018-11-15 ENCOUNTER — Other Ambulatory Visit (HOSPITAL_COMMUNITY): Payer: Self-pay | Admitting: Internal Medicine

## 2018-11-17 DIAGNOSIS — I252 Old myocardial infarction: Secondary | ICD-10-CM | POA: Diagnosis not present

## 2018-11-17 DIAGNOSIS — I255 Ischemic cardiomyopathy: Secondary | ICD-10-CM | POA: Diagnosis not present

## 2018-11-17 DIAGNOSIS — Z452 Encounter for adjustment and management of vascular access device: Secondary | ICD-10-CM | POA: Diagnosis not present

## 2018-11-17 DIAGNOSIS — Z955 Presence of coronary angioplasty implant and graft: Secondary | ICD-10-CM | POA: Diagnosis not present

## 2018-11-17 DIAGNOSIS — I25110 Atherosclerotic heart disease of native coronary artery with unstable angina pectoris: Secondary | ICD-10-CM | POA: Diagnosis not present

## 2018-11-17 DIAGNOSIS — E785 Hyperlipidemia, unspecified: Secondary | ICD-10-CM | POA: Diagnosis not present

## 2018-11-17 DIAGNOSIS — Z7982 Long term (current) use of aspirin: Secondary | ICD-10-CM | POA: Diagnosis not present

## 2018-11-17 DIAGNOSIS — Z79899 Other long term (current) drug therapy: Secondary | ICD-10-CM | POA: Diagnosis not present

## 2018-11-17 DIAGNOSIS — Z7901 Long term (current) use of anticoagulants: Secondary | ICD-10-CM | POA: Diagnosis not present

## 2018-11-17 DIAGNOSIS — Z5181 Encounter for therapeutic drug level monitoring: Secondary | ICD-10-CM | POA: Diagnosis not present

## 2018-11-17 DIAGNOSIS — I130 Hypertensive heart and chronic kidney disease with heart failure and stage 1 through stage 4 chronic kidney disease, or unspecified chronic kidney disease: Secondary | ICD-10-CM | POA: Diagnosis not present

## 2018-11-17 DIAGNOSIS — Z950 Presence of cardiac pacemaker: Secondary | ICD-10-CM | POA: Diagnosis not present

## 2018-11-17 DIAGNOSIS — N183 Chronic kidney disease, stage 3 unspecified: Secondary | ICD-10-CM | POA: Diagnosis not present

## 2018-11-17 DIAGNOSIS — I5022 Chronic systolic (congestive) heart failure: Secondary | ICD-10-CM | POA: Diagnosis not present

## 2018-11-20 ENCOUNTER — Other Ambulatory Visit (HOSPITAL_COMMUNITY): Payer: Self-pay | Admitting: Internal Medicine

## 2018-11-20 DIAGNOSIS — I5022 Chronic systolic (congestive) heart failure: Secondary | ICD-10-CM | POA: Diagnosis not present

## 2018-11-20 DIAGNOSIS — I25110 Atherosclerotic heart disease of native coronary artery with unstable angina pectoris: Secondary | ICD-10-CM | POA: Diagnosis not present

## 2018-11-20 DIAGNOSIS — I130 Hypertensive heart and chronic kidney disease with heart failure and stage 1 through stage 4 chronic kidney disease, or unspecified chronic kidney disease: Secondary | ICD-10-CM | POA: Diagnosis not present

## 2018-11-20 DIAGNOSIS — N183 Chronic kidney disease, stage 3 unspecified: Secondary | ICD-10-CM | POA: Diagnosis not present

## 2018-11-20 DIAGNOSIS — I252 Old myocardial infarction: Secondary | ICD-10-CM | POA: Diagnosis not present

## 2018-11-20 DIAGNOSIS — I255 Ischemic cardiomyopathy: Secondary | ICD-10-CM | POA: Diagnosis not present

## 2018-11-27 DIAGNOSIS — I252 Old myocardial infarction: Secondary | ICD-10-CM | POA: Diagnosis not present

## 2018-11-27 DIAGNOSIS — N183 Chronic kidney disease, stage 3 unspecified: Secondary | ICD-10-CM | POA: Diagnosis not present

## 2018-11-27 DIAGNOSIS — I255 Ischemic cardiomyopathy: Secondary | ICD-10-CM | POA: Diagnosis not present

## 2018-11-27 DIAGNOSIS — I5022 Chronic systolic (congestive) heart failure: Secondary | ICD-10-CM | POA: Diagnosis not present

## 2018-11-27 DIAGNOSIS — I25110 Atherosclerotic heart disease of native coronary artery with unstable angina pectoris: Secondary | ICD-10-CM | POA: Diagnosis not present

## 2018-11-27 DIAGNOSIS — I130 Hypertensive heart and chronic kidney disease with heart failure and stage 1 through stage 4 chronic kidney disease, or unspecified chronic kidney disease: Secondary | ICD-10-CM | POA: Diagnosis not present

## 2018-12-04 DIAGNOSIS — I25110 Atherosclerotic heart disease of native coronary artery with unstable angina pectoris: Secondary | ICD-10-CM | POA: Diagnosis not present

## 2018-12-04 DIAGNOSIS — I252 Old myocardial infarction: Secondary | ICD-10-CM | POA: Diagnosis not present

## 2018-12-04 DIAGNOSIS — I130 Hypertensive heart and chronic kidney disease with heart failure and stage 1 through stage 4 chronic kidney disease, or unspecified chronic kidney disease: Secondary | ICD-10-CM | POA: Diagnosis not present

## 2018-12-04 DIAGNOSIS — I255 Ischemic cardiomyopathy: Secondary | ICD-10-CM | POA: Diagnosis not present

## 2018-12-04 DIAGNOSIS — N183 Chronic kidney disease, stage 3 unspecified: Secondary | ICD-10-CM | POA: Diagnosis not present

## 2018-12-04 DIAGNOSIS — I5022 Chronic systolic (congestive) heart failure: Secondary | ICD-10-CM | POA: Diagnosis not present

## 2018-12-11 DIAGNOSIS — I130 Hypertensive heart and chronic kidney disease with heart failure and stage 1 through stage 4 chronic kidney disease, or unspecified chronic kidney disease: Secondary | ICD-10-CM | POA: Diagnosis not present

## 2018-12-11 DIAGNOSIS — Z7682 Awaiting organ transplant status: Secondary | ICD-10-CM | POA: Diagnosis not present

## 2018-12-11 DIAGNOSIS — I25110 Atherosclerotic heart disease of native coronary artery with unstable angina pectoris: Secondary | ICD-10-CM | POA: Diagnosis not present

## 2018-12-11 DIAGNOSIS — N183 Chronic kidney disease, stage 3 unspecified: Secondary | ICD-10-CM | POA: Diagnosis not present

## 2018-12-11 DIAGNOSIS — I5022 Chronic systolic (congestive) heart failure: Secondary | ICD-10-CM | POA: Diagnosis not present

## 2018-12-11 DIAGNOSIS — I255 Ischemic cardiomyopathy: Secondary | ICD-10-CM | POA: Diagnosis not present

## 2018-12-11 DIAGNOSIS — I252 Old myocardial infarction: Secondary | ICD-10-CM | POA: Diagnosis not present

## 2018-12-12 ENCOUNTER — Other Ambulatory Visit (HOSPITAL_COMMUNITY): Payer: Self-pay | Admitting: Internal Medicine

## 2018-12-16 ENCOUNTER — Other Ambulatory Visit (HOSPITAL_COMMUNITY): Payer: Self-pay

## 2018-12-16 DIAGNOSIS — N183 Chronic kidney disease, stage 3 unspecified: Secondary | ICD-10-CM | POA: Diagnosis not present

## 2018-12-16 DIAGNOSIS — I130 Hypertensive heart and chronic kidney disease with heart failure and stage 1 through stage 4 chronic kidney disease, or unspecified chronic kidney disease: Secondary | ICD-10-CM | POA: Diagnosis not present

## 2018-12-16 DIAGNOSIS — I255 Ischemic cardiomyopathy: Secondary | ICD-10-CM | POA: Diagnosis not present

## 2018-12-16 DIAGNOSIS — I25110 Atherosclerotic heart disease of native coronary artery with unstable angina pectoris: Secondary | ICD-10-CM | POA: Diagnosis not present

## 2018-12-16 DIAGNOSIS — I5022 Chronic systolic (congestive) heart failure: Secondary | ICD-10-CM | POA: Diagnosis not present

## 2018-12-16 DIAGNOSIS — I252 Old myocardial infarction: Secondary | ICD-10-CM | POA: Diagnosis not present

## 2018-12-16 MED ORDER — TICAGRELOR 90 MG PO TABS: 90.0000 mg | ORAL_TABLET | Freq: Two times a day (BID) | ORAL | 1 refills | Status: DC

## 2018-12-17 DIAGNOSIS — I130 Hypertensive heart and chronic kidney disease with heart failure and stage 1 through stage 4 chronic kidney disease, or unspecified chronic kidney disease: Secondary | ICD-10-CM | POA: Diagnosis not present

## 2018-12-17 DIAGNOSIS — Z7982 Long term (current) use of aspirin: Secondary | ICD-10-CM | POA: Diagnosis not present

## 2018-12-17 DIAGNOSIS — I5022 Chronic systolic (congestive) heart failure: Secondary | ICD-10-CM | POA: Diagnosis not present

## 2018-12-17 DIAGNOSIS — N183 Chronic kidney disease, stage 3 unspecified: Secondary | ICD-10-CM | POA: Diagnosis not present

## 2018-12-17 DIAGNOSIS — Z79899 Other long term (current) drug therapy: Secondary | ICD-10-CM | POA: Diagnosis not present

## 2018-12-17 DIAGNOSIS — I25110 Atherosclerotic heart disease of native coronary artery with unstable angina pectoris: Secondary | ICD-10-CM | POA: Diagnosis not present

## 2018-12-17 DIAGNOSIS — Z452 Encounter for adjustment and management of vascular access device: Secondary | ICD-10-CM | POA: Diagnosis not present

## 2018-12-17 DIAGNOSIS — Z7901 Long term (current) use of anticoagulants: Secondary | ICD-10-CM | POA: Diagnosis not present

## 2018-12-17 DIAGNOSIS — I255 Ischemic cardiomyopathy: Secondary | ICD-10-CM | POA: Diagnosis not present

## 2018-12-17 DIAGNOSIS — Z955 Presence of coronary angioplasty implant and graft: Secondary | ICD-10-CM | POA: Diagnosis not present

## 2018-12-17 DIAGNOSIS — E785 Hyperlipidemia, unspecified: Secondary | ICD-10-CM | POA: Diagnosis not present

## 2018-12-17 DIAGNOSIS — I252 Old myocardial infarction: Secondary | ICD-10-CM | POA: Diagnosis not present

## 2018-12-17 DIAGNOSIS — Z950 Presence of cardiac pacemaker: Secondary | ICD-10-CM | POA: Diagnosis not present

## 2018-12-17 DIAGNOSIS — Z5181 Encounter for therapeutic drug level monitoring: Secondary | ICD-10-CM | POA: Diagnosis not present

## 2018-12-18 DIAGNOSIS — R509 Fever, unspecified: Secondary | ICD-10-CM | POA: Diagnosis not present

## 2018-12-18 DIAGNOSIS — I5022 Chronic systolic (congestive) heart failure: Secondary | ICD-10-CM | POA: Diagnosis not present

## 2018-12-18 DIAGNOSIS — I252 Old myocardial infarction: Secondary | ICD-10-CM | POA: Diagnosis not present

## 2018-12-18 DIAGNOSIS — I255 Ischemic cardiomyopathy: Secondary | ICD-10-CM | POA: Diagnosis not present

## 2018-12-18 DIAGNOSIS — N184 Chronic kidney disease, stage 4 (severe): Secondary | ICD-10-CM | POA: Diagnosis not present

## 2018-12-18 DIAGNOSIS — I130 Hypertensive heart and chronic kidney disease with heart failure and stage 1 through stage 4 chronic kidney disease, or unspecified chronic kidney disease: Secondary | ICD-10-CM | POA: Diagnosis not present

## 2018-12-18 DIAGNOSIS — I25110 Atherosclerotic heart disease of native coronary artery with unstable angina pectoris: Secondary | ICD-10-CM | POA: Diagnosis not present

## 2018-12-18 DIAGNOSIS — N183 Chronic kidney disease, stage 3 unspecified: Secondary | ICD-10-CM | POA: Diagnosis not present

## 2018-12-18 DIAGNOSIS — Z79899 Other long term (current) drug therapy: Secondary | ICD-10-CM | POA: Diagnosis not present

## 2018-12-25 ENCOUNTER — Other Ambulatory Visit (HOSPITAL_COMMUNITY): Payer: Self-pay | Admitting: Internal Medicine

## 2018-12-25 DIAGNOSIS — I130 Hypertensive heart and chronic kidney disease with heart failure and stage 1 through stage 4 chronic kidney disease, or unspecified chronic kidney disease: Secondary | ICD-10-CM | POA: Diagnosis not present

## 2018-12-25 DIAGNOSIS — I25110 Atherosclerotic heart disease of native coronary artery with unstable angina pectoris: Secondary | ICD-10-CM | POA: Diagnosis not present

## 2018-12-25 DIAGNOSIS — I255 Ischemic cardiomyopathy: Secondary | ICD-10-CM | POA: Diagnosis not present

## 2018-12-25 DIAGNOSIS — I252 Old myocardial infarction: Secondary | ICD-10-CM | POA: Diagnosis not present

## 2018-12-25 DIAGNOSIS — I5022 Chronic systolic (congestive) heart failure: Secondary | ICD-10-CM | POA: Diagnosis not present

## 2018-12-25 DIAGNOSIS — N183 Chronic kidney disease, stage 3 unspecified: Secondary | ICD-10-CM | POA: Diagnosis not present

## 2018-12-28 DIAGNOSIS — I5022 Chronic systolic (congestive) heart failure: Secondary | ICD-10-CM | POA: Diagnosis not present

## 2018-12-28 DIAGNOSIS — N183 Chronic kidney disease, stage 3 unspecified: Secondary | ICD-10-CM | POA: Diagnosis not present

## 2018-12-28 DIAGNOSIS — I25110 Atherosclerotic heart disease of native coronary artery with unstable angina pectoris: Secondary | ICD-10-CM | POA: Diagnosis not present

## 2018-12-28 DIAGNOSIS — I130 Hypertensive heart and chronic kidney disease with heart failure and stage 1 through stage 4 chronic kidney disease, or unspecified chronic kidney disease: Secondary | ICD-10-CM | POA: Diagnosis not present

## 2018-12-28 DIAGNOSIS — I255 Ischemic cardiomyopathy: Secondary | ICD-10-CM | POA: Diagnosis not present

## 2018-12-28 DIAGNOSIS — Z5181 Encounter for therapeutic drug level monitoring: Secondary | ICD-10-CM | POA: Diagnosis not present

## 2018-12-28 DIAGNOSIS — I252 Old myocardial infarction: Secondary | ICD-10-CM | POA: Diagnosis not present

## 2018-12-30 DIAGNOSIS — Z941 Heart transplant status: Secondary | ICD-10-CM | POA: Diagnosis not present

## 2018-12-31 DIAGNOSIS — Z941 Heart transplant status: Secondary | ICD-10-CM | POA: Diagnosis not present

## 2018-12-31 DIAGNOSIS — I5022 Chronic systolic (congestive) heart failure: Secondary | ICD-10-CM | POA: Diagnosis not present

## 2018-12-31 DIAGNOSIS — I255 Ischemic cardiomyopathy: Secondary | ICD-10-CM | POA: Diagnosis not present

## 2018-12-31 DIAGNOSIS — R918 Other nonspecific abnormal finding of lung field: Secondary | ICD-10-CM | POA: Diagnosis not present

## 2019-01-02 DIAGNOSIS — N184 Chronic kidney disease, stage 4 (severe): Secondary | ICD-10-CM | POA: Diagnosis not present

## 2019-01-02 DIAGNOSIS — Z940 Kidney transplant status: Secondary | ICD-10-CM | POA: Diagnosis not present

## 2019-01-02 DIAGNOSIS — I255 Ischemic cardiomyopathy: Secondary | ICD-10-CM | POA: Diagnosis not present

## 2019-01-02 DIAGNOSIS — G8918 Other acute postprocedural pain: Secondary | ICD-10-CM | POA: Diagnosis not present

## 2019-01-02 DIAGNOSIS — Z941 Heart transplant status: Secondary | ICD-10-CM | POA: Diagnosis not present

## 2019-01-02 DIAGNOSIS — I2510 Atherosclerotic heart disease of native coronary artery without angina pectoris: Secondary | ICD-10-CM | POA: Diagnosis not present

## 2019-01-02 DIAGNOSIS — I10 Essential (primary) hypertension: Secondary | ICD-10-CM | POA: Diagnosis not present

## 2019-01-02 DIAGNOSIS — E785 Hyperlipidemia, unspecified: Secondary | ICD-10-CM | POA: Diagnosis not present

## 2019-01-02 DIAGNOSIS — R739 Hyperglycemia, unspecified: Secondary | ICD-10-CM | POA: Diagnosis not present

## 2019-01-02 DIAGNOSIS — Z79899 Other long term (current) drug therapy: Secondary | ICD-10-CM | POA: Diagnosis not present

## 2019-01-02 DIAGNOSIS — I5022 Chronic systolic (congestive) heart failure: Secondary | ICD-10-CM | POA: Diagnosis not present

## 2019-01-02 DIAGNOSIS — D72829 Elevated white blood cell count, unspecified: Secondary | ICD-10-CM | POA: Diagnosis not present

## 2019-01-03 ENCOUNTER — Other Ambulatory Visit (HOSPITAL_COMMUNITY): Payer: Self-pay | Admitting: Internal Medicine

## 2019-01-13 DIAGNOSIS — D8989 Other specified disorders involving the immune mechanism, not elsewhere classified: Secondary | ICD-10-CM | POA: Diagnosis not present

## 2019-01-13 DIAGNOSIS — Z940 Kidney transplant status: Secondary | ICD-10-CM | POA: Diagnosis not present

## 2019-01-16 ENCOUNTER — Telehealth (HOSPITAL_COMMUNITY): Payer: Self-pay

## 2019-01-16 ENCOUNTER — Encounter (HOSPITAL_COMMUNITY): Payer: Self-pay | Admitting: *Deleted

## 2019-01-16 NOTE — Telephone Encounter (Signed)
Attempted to call patient in regards to Cardiac Rehab - LM on VM 

## 2019-01-16 NOTE — Progress Notes (Signed)
Received referral notification from Dr. Norm Parcel at [facility] for this pt to participate in Cardiac rehab with the diagnosis of 12/29/18 Heart and Kidney Transplant.  Reviewed medical history in epic and Care Everywhere.  Pt has completed his follow up appt on 12/28  and subsequent biopsy on 12/30. Covid risk score 4. Pt is appropriate to contact for referral notification and virtual cardiac rehab pending resuming onsite cardiac rehab per support staff.  Will send MD referral form to be signed by Dr. Norm Parcel and request copy of 12 lead ekg. Pt next follow up is on 1/4.  Once requested paperwork received pt can be scheduled for his initial assessment. Cherre Huger, BSN Cardiac and Training and development officer

## 2019-01-20 DIAGNOSIS — Z941 Heart transplant status: Secondary | ICD-10-CM | POA: Diagnosis not present

## 2019-01-20 DIAGNOSIS — Z79899 Other long term (current) drug therapy: Secondary | ICD-10-CM | POA: Diagnosis not present

## 2019-01-20 DIAGNOSIS — Z48298 Encounter for aftercare following other organ transplant: Secondary | ICD-10-CM | POA: Diagnosis not present

## 2019-01-20 DIAGNOSIS — I10 Essential (primary) hypertension: Secondary | ICD-10-CM | POA: Diagnosis not present

## 2019-01-20 DIAGNOSIS — D849 Immunodeficiency, unspecified: Secondary | ICD-10-CM | POA: Diagnosis not present

## 2019-01-20 DIAGNOSIS — Z008 Encounter for other general examination: Secondary | ICD-10-CM | POA: Diagnosis not present

## 2019-01-20 DIAGNOSIS — Z1159 Encounter for screening for other viral diseases: Secondary | ICD-10-CM | POA: Diagnosis not present

## 2019-01-20 DIAGNOSIS — Z5181 Encounter for therapeutic drug level monitoring: Secondary | ICD-10-CM | POA: Diagnosis not present

## 2019-01-22 DIAGNOSIS — I25811 Atherosclerosis of native coronary artery of transplanted heart without angina pectoris: Secondary | ICD-10-CM | POA: Diagnosis not present

## 2019-01-22 DIAGNOSIS — J948 Other specified pleural conditions: Secondary | ICD-10-CM | POA: Diagnosis not present

## 2019-01-22 DIAGNOSIS — Z9889 Other specified postprocedural states: Secondary | ICD-10-CM | POA: Diagnosis not present

## 2019-01-22 DIAGNOSIS — Z941 Heart transplant status: Secondary | ICD-10-CM | POA: Diagnosis not present

## 2019-01-22 DIAGNOSIS — I517 Cardiomegaly: Secondary | ICD-10-CM | POA: Diagnosis not present

## 2019-01-22 DIAGNOSIS — Z79899 Other long term (current) drug therapy: Secondary | ICD-10-CM | POA: Diagnosis not present

## 2019-01-22 DIAGNOSIS — Z298 Encounter for other specified prophylactic measures: Secondary | ICD-10-CM | POA: Diagnosis not present

## 2019-01-22 DIAGNOSIS — I10 Essential (primary) hypertension: Secondary | ICD-10-CM | POA: Diagnosis not present

## 2019-01-22 DIAGNOSIS — Z940 Kidney transplant status: Secondary | ICD-10-CM | POA: Diagnosis not present

## 2019-01-24 ENCOUNTER — Telehealth: Payer: Self-pay

## 2019-01-24 NOTE — Telephone Encounter (Signed)
Left message for patient to remind of missed remote transmission.  

## 2019-01-27 DIAGNOSIS — E785 Hyperlipidemia, unspecified: Secondary | ICD-10-CM | POA: Diagnosis not present

## 2019-01-27 DIAGNOSIS — Z794 Long term (current) use of insulin: Secondary | ICD-10-CM | POA: Diagnosis not present

## 2019-01-27 DIAGNOSIS — I129 Hypertensive chronic kidney disease with stage 1 through stage 4 chronic kidney disease, or unspecified chronic kidney disease: Secondary | ICD-10-CM | POA: Diagnosis not present

## 2019-01-27 DIAGNOSIS — Z48298 Encounter for aftercare following other organ transplant: Secondary | ICD-10-CM | POA: Diagnosis not present

## 2019-01-27 DIAGNOSIS — Z008 Encounter for other general examination: Secondary | ICD-10-CM | POA: Diagnosis not present

## 2019-01-27 DIAGNOSIS — E1122 Type 2 diabetes mellitus with diabetic chronic kidney disease: Secondary | ICD-10-CM | POA: Diagnosis not present

## 2019-01-27 DIAGNOSIS — Z7952 Long term (current) use of systemic steroids: Secondary | ICD-10-CM | POA: Diagnosis not present

## 2019-01-27 DIAGNOSIS — I10 Essential (primary) hypertension: Secondary | ICD-10-CM | POA: Diagnosis not present

## 2019-01-27 DIAGNOSIS — N189 Chronic kidney disease, unspecified: Secondary | ICD-10-CM | POA: Diagnosis not present

## 2019-01-27 DIAGNOSIS — Z5181 Encounter for therapeutic drug level monitoring: Secondary | ICD-10-CM | POA: Diagnosis not present

## 2019-01-27 DIAGNOSIS — D849 Immunodeficiency, unspecified: Secondary | ICD-10-CM | POA: Diagnosis not present

## 2019-01-27 DIAGNOSIS — Z1159 Encounter for screening for other viral diseases: Secondary | ICD-10-CM | POA: Diagnosis not present

## 2019-01-27 DIAGNOSIS — Z941 Heart transplant status: Secondary | ICD-10-CM | POA: Diagnosis not present

## 2019-01-27 DIAGNOSIS — T8621 Heart transplant rejection: Secondary | ICD-10-CM | POA: Diagnosis not present

## 2019-01-27 DIAGNOSIS — Z79899 Other long term (current) drug therapy: Secondary | ICD-10-CM | POA: Diagnosis not present

## 2019-01-31 ENCOUNTER — Telehealth (HOSPITAL_COMMUNITY): Payer: Self-pay

## 2019-01-31 ENCOUNTER — Encounter (HOSPITAL_COMMUNITY): Payer: Self-pay

## 2019-01-31 NOTE — Telephone Encounter (Signed)
Attempted to call patient in regards to Cardiac Rehab - LM on VM Mailed letter 

## 2019-02-06 DIAGNOSIS — I10 Essential (primary) hypertension: Secondary | ICD-10-CM | POA: Diagnosis not present

## 2019-02-06 DIAGNOSIS — D849 Immunodeficiency, unspecified: Secondary | ICD-10-CM | POA: Diagnosis not present

## 2019-02-06 DIAGNOSIS — T50905A Adverse effect of unspecified drugs, medicaments and biological substances, initial encounter: Secondary | ICD-10-CM | POA: Diagnosis not present

## 2019-02-06 DIAGNOSIS — Z940 Kidney transplant status: Secondary | ICD-10-CM | POA: Diagnosis not present

## 2019-02-06 DIAGNOSIS — Z298 Encounter for other specified prophylactic measures: Secondary | ICD-10-CM | POA: Diagnosis not present

## 2019-02-06 DIAGNOSIS — Z941 Heart transplant status: Secondary | ICD-10-CM | POA: Diagnosis not present

## 2019-02-06 DIAGNOSIS — R739 Hyperglycemia, unspecified: Secondary | ICD-10-CM | POA: Diagnosis not present

## 2019-02-06 DIAGNOSIS — E785 Hyperlipidemia, unspecified: Secondary | ICD-10-CM | POA: Diagnosis not present

## 2019-02-10 DIAGNOSIS — Z4821 Encounter for aftercare following heart transplant: Secondary | ICD-10-CM | POA: Diagnosis not present

## 2019-02-10 DIAGNOSIS — Z794 Long term (current) use of insulin: Secondary | ICD-10-CM | POA: Diagnosis not present

## 2019-02-10 DIAGNOSIS — E785 Hyperlipidemia, unspecified: Secondary | ICD-10-CM | POA: Diagnosis not present

## 2019-02-10 DIAGNOSIS — D849 Immunodeficiency, unspecified: Secondary | ICD-10-CM | POA: Diagnosis not present

## 2019-02-10 DIAGNOSIS — Z941 Heart transplant status: Secondary | ICD-10-CM | POA: Diagnosis not present

## 2019-02-10 DIAGNOSIS — Z5181 Encounter for therapeutic drug level monitoring: Secondary | ICD-10-CM | POA: Diagnosis not present

## 2019-02-10 DIAGNOSIS — Z48298 Encounter for aftercare following other organ transplant: Secondary | ICD-10-CM | POA: Diagnosis not present

## 2019-02-10 DIAGNOSIS — Z008 Encounter for other general examination: Secondary | ICD-10-CM | POA: Diagnosis not present

## 2019-02-10 DIAGNOSIS — Z79899 Other long term (current) drug therapy: Secondary | ICD-10-CM | POA: Diagnosis not present

## 2019-02-10 DIAGNOSIS — E1122 Type 2 diabetes mellitus with diabetic chronic kidney disease: Secondary | ICD-10-CM | POA: Diagnosis not present

## 2019-02-10 DIAGNOSIS — I10 Essential (primary) hypertension: Secondary | ICD-10-CM | POA: Diagnosis not present

## 2019-02-10 DIAGNOSIS — N184 Chronic kidney disease, stage 4 (severe): Secondary | ICD-10-CM | POA: Diagnosis not present

## 2019-02-10 DIAGNOSIS — Z1159 Encounter for screening for other viral diseases: Secondary | ICD-10-CM | POA: Diagnosis not present

## 2019-02-10 DIAGNOSIS — I1310 Hypertensive heart and chronic kidney disease without heart failure, with stage 1 through stage 4 chronic kidney disease, or unspecified chronic kidney disease: Secondary | ICD-10-CM | POA: Diagnosis not present

## 2019-02-17 DIAGNOSIS — Z940 Kidney transplant status: Secondary | ICD-10-CM | POA: Diagnosis not present

## 2019-02-17 DIAGNOSIS — Z4822 Encounter for aftercare following kidney transplant: Secondary | ICD-10-CM | POA: Diagnosis not present

## 2019-02-24 DIAGNOSIS — E1122 Type 2 diabetes mellitus with diabetic chronic kidney disease: Secondary | ICD-10-CM | POA: Diagnosis not present

## 2019-02-24 DIAGNOSIS — T8621 Heart transplant rejection: Secondary | ICD-10-CM | POA: Diagnosis not present

## 2019-02-24 DIAGNOSIS — E785 Hyperlipidemia, unspecified: Secondary | ICD-10-CM | POA: Diagnosis not present

## 2019-02-24 DIAGNOSIS — Z48298 Encounter for aftercare following other organ transplant: Secondary | ICD-10-CM | POA: Diagnosis not present

## 2019-02-24 DIAGNOSIS — D849 Immunodeficiency, unspecified: Secondary | ICD-10-CM | POA: Diagnosis not present

## 2019-02-24 DIAGNOSIS — Z1159 Encounter for screening for other viral diseases: Secondary | ICD-10-CM | POA: Diagnosis not present

## 2019-02-24 DIAGNOSIS — I129 Hypertensive chronic kidney disease with stage 1 through stage 4 chronic kidney disease, or unspecified chronic kidney disease: Secondary | ICD-10-CM | POA: Diagnosis not present

## 2019-02-24 DIAGNOSIS — I10 Essential (primary) hypertension: Secondary | ICD-10-CM | POA: Diagnosis not present

## 2019-02-24 DIAGNOSIS — N189 Chronic kidney disease, unspecified: Secondary | ICD-10-CM | POA: Diagnosis not present

## 2019-02-24 DIAGNOSIS — Z5181 Encounter for therapeutic drug level monitoring: Secondary | ICD-10-CM | POA: Diagnosis not present

## 2019-02-24 DIAGNOSIS — Z941 Heart transplant status: Secondary | ICD-10-CM | POA: Diagnosis not present

## 2019-02-24 DIAGNOSIS — Z008 Encounter for other general examination: Secondary | ICD-10-CM | POA: Diagnosis not present

## 2019-02-24 DIAGNOSIS — Z794 Long term (current) use of insulin: Secondary | ICD-10-CM | POA: Diagnosis not present

## 2019-02-24 DIAGNOSIS — Z79899 Other long term (current) drug therapy: Secondary | ICD-10-CM | POA: Diagnosis not present

## 2019-03-07 DIAGNOSIS — D849 Immunodeficiency, unspecified: Secondary | ICD-10-CM | POA: Diagnosis not present

## 2019-03-07 DIAGNOSIS — Z298 Encounter for other specified prophylactic measures: Secondary | ICD-10-CM | POA: Diagnosis not present

## 2019-03-07 DIAGNOSIS — Z941 Heart transplant status: Secondary | ICD-10-CM | POA: Diagnosis not present

## 2019-03-07 DIAGNOSIS — Z940 Kidney transplant status: Secondary | ICD-10-CM | POA: Diagnosis not present

## 2019-03-07 DIAGNOSIS — I10 Essential (primary) hypertension: Secondary | ICD-10-CM | POA: Diagnosis not present

## 2019-03-07 DIAGNOSIS — Z79899 Other long term (current) drug therapy: Secondary | ICD-10-CM | POA: Diagnosis not present

## 2019-03-24 DIAGNOSIS — E1165 Type 2 diabetes mellitus with hyperglycemia: Secondary | ICD-10-CM | POA: Diagnosis not present

## 2019-03-24 DIAGNOSIS — Z4822 Encounter for aftercare following kidney transplant: Secondary | ICD-10-CM | POA: Diagnosis not present

## 2019-03-24 DIAGNOSIS — E785 Hyperlipidemia, unspecified: Secondary | ICD-10-CM | POA: Diagnosis not present

## 2019-03-24 DIAGNOSIS — I10 Essential (primary) hypertension: Secondary | ICD-10-CM | POA: Diagnosis not present

## 2019-03-24 DIAGNOSIS — Z79899 Other long term (current) drug therapy: Secondary | ICD-10-CM | POA: Diagnosis not present

## 2019-03-24 DIAGNOSIS — Z941 Heart transplant status: Secondary | ICD-10-CM | POA: Diagnosis not present

## 2019-04-04 DIAGNOSIS — Z941 Heart transplant status: Secondary | ICD-10-CM | POA: Diagnosis not present

## 2019-04-04 DIAGNOSIS — Z48298 Encounter for aftercare following other organ transplant: Secondary | ICD-10-CM | POA: Diagnosis not present

## 2019-04-05 ENCOUNTER — Inpatient Hospital Stay (HOSPITAL_COMMUNITY)
Admission: EM | Admit: 2019-04-05 | Discharge: 2019-04-07 | DRG: 638 | Disposition: A | Discharge status: Home / Self Care | Payer: Medicare Other | Attending: Internal Medicine | Admitting: Internal Medicine

## 2019-04-05 ENCOUNTER — Emergency Department (HOSPITAL_COMMUNITY): Payer: Medicare Other

## 2019-04-05 ENCOUNTER — Other Ambulatory Visit: Payer: Self-pay

## 2019-04-05 ENCOUNTER — Encounter (HOSPITAL_COMMUNITY): Payer: Self-pay | Admitting: Emergency Medicine

## 2019-04-05 DIAGNOSIS — E876 Hypokalemia: Secondary | ICD-10-CM | POA: Diagnosis present

## 2019-04-05 DIAGNOSIS — I10 Essential (primary) hypertension: Secondary | ICD-10-CM | POA: Diagnosis not present

## 2019-04-05 DIAGNOSIS — Z8249 Family history of ischemic heart disease and other diseases of the circulatory system: Secondary | ICD-10-CM | POA: Diagnosis not present

## 2019-04-05 DIAGNOSIS — N183 Chronic kidney disease, stage 3 unspecified: Secondary | ICD-10-CM | POA: Diagnosis present

## 2019-04-05 DIAGNOSIS — Z7982 Long term (current) use of aspirin: Secondary | ICD-10-CM | POA: Diagnosis not present

## 2019-04-05 DIAGNOSIS — D849 Immunodeficiency, unspecified: Secondary | ICD-10-CM | POA: Diagnosis present

## 2019-04-05 DIAGNOSIS — I252 Old myocardial infarction: Secondary | ICD-10-CM

## 2019-04-05 DIAGNOSIS — E1122 Type 2 diabetes mellitus with diabetic chronic kidney disease: Secondary | ICD-10-CM | POA: Diagnosis present

## 2019-04-05 DIAGNOSIS — E1110 Type 2 diabetes mellitus with ketoacidosis without coma: Principal | ICD-10-CM | POA: Diagnosis present

## 2019-04-05 DIAGNOSIS — N179 Acute kidney failure, unspecified: Secondary | ICD-10-CM | POA: Diagnosis not present

## 2019-04-05 DIAGNOSIS — E860 Dehydration: Secondary | ICD-10-CM | POA: Diagnosis present

## 2019-04-05 DIAGNOSIS — I2510 Atherosclerotic heart disease of native coronary artery without angina pectoris: Secondary | ICD-10-CM | POA: Diagnosis not present

## 2019-04-05 DIAGNOSIS — T50905A Adverse effect of unspecified drugs, medicaments and biological substances, initial encounter: Secondary | ICD-10-CM | POA: Diagnosis not present

## 2019-04-05 DIAGNOSIS — Z941 Heart transplant status: Secondary | ICD-10-CM

## 2019-04-05 DIAGNOSIS — I5022 Chronic systolic (congestive) heart failure: Secondary | ICD-10-CM | POA: Diagnosis present

## 2019-04-05 DIAGNOSIS — I130 Hypertensive heart and chronic kidney disease with heart failure and stage 1 through stage 4 chronic kidney disease, or unspecified chronic kidney disease: Secondary | ICD-10-CM | POA: Diagnosis present

## 2019-04-05 DIAGNOSIS — N182 Chronic kidney disease, stage 2 (mild): Secondary | ICD-10-CM | POA: Diagnosis present

## 2019-04-05 DIAGNOSIS — E785 Hyperlipidemia, unspecified: Secondary | ICD-10-CM | POA: Diagnosis present

## 2019-04-05 DIAGNOSIS — K219 Gastro-esophageal reflux disease without esophagitis: Secondary | ICD-10-CM | POA: Diagnosis present

## 2019-04-05 DIAGNOSIS — E1165 Type 2 diabetes mellitus with hyperglycemia: Secondary | ICD-10-CM | POA: Diagnosis not present

## 2019-04-05 DIAGNOSIS — Z7952 Long term (current) use of systemic steroids: Secondary | ICD-10-CM | POA: Diagnosis not present

## 2019-04-05 DIAGNOSIS — I255 Ischemic cardiomyopathy: Secondary | ICD-10-CM | POA: Diagnosis present

## 2019-04-05 DIAGNOSIS — K5909 Other constipation: Secondary | ICD-10-CM | POA: Diagnosis present

## 2019-04-05 DIAGNOSIS — Z794 Long term (current) use of insulin: Secondary | ICD-10-CM

## 2019-04-05 DIAGNOSIS — R739 Hyperglycemia, unspecified: Secondary | ICD-10-CM | POA: Diagnosis not present

## 2019-04-05 DIAGNOSIS — Z79899 Other long term (current) drug therapy: Secondary | ICD-10-CM

## 2019-04-05 DIAGNOSIS — Z940 Kidney transplant status: Secondary | ICD-10-CM

## 2019-04-05 DIAGNOSIS — T380X5A Adverse effect of glucocorticoids and synthetic analogues, initial encounter: Secondary | ICD-10-CM | POA: Diagnosis present

## 2019-04-05 DIAGNOSIS — Z20822 Contact with and (suspected) exposure to covid-19: Secondary | ICD-10-CM | POA: Diagnosis present

## 2019-04-05 DIAGNOSIS — E1169 Type 2 diabetes mellitus with other specified complication: Secondary | ICD-10-CM | POA: Diagnosis present

## 2019-04-05 DIAGNOSIS — M109 Gout, unspecified: Secondary | ICD-10-CM | POA: Diagnosis present

## 2019-04-05 DIAGNOSIS — M1A00X0 Idiopathic chronic gout, unspecified site, without tophus (tophi): Secondary | ICD-10-CM | POA: Diagnosis not present

## 2019-04-05 DIAGNOSIS — R5383 Other fatigue: Secondary | ICD-10-CM | POA: Diagnosis not present

## 2019-04-05 LAB — CBC
HCT: 43.8 % (ref 39.0–52.0)
Hemoglobin: 15 g/dL (ref 13.0–17.0)
MCH: 31.5 pg (ref 26.0–34.0)
MCHC: 34.2 g/dL (ref 30.0–36.0)
MCV: 92 fL (ref 80.0–100.0)
Platelets: 209 10*3/uL (ref 150–400)
RBC: 4.76 MIL/uL (ref 4.22–5.81)
RDW: 12.8 % (ref 11.5–15.5)
WBC: 7.4 10*3/uL (ref 4.0–10.5)
nRBC: 0 % (ref 0.0–0.2)

## 2019-04-05 LAB — COMPREHENSIVE METABOLIC PANEL
ALT: 27 U/L (ref 0–44)
ALT: 25 U/L (ref 0–44)
AST: 31 U/L (ref 15–41)
AST: 27 U/L (ref 15–41)
Albumin: 3.7 g/dL (ref 3.5–5.0)
Albumin: 3.4 g/dL — ABNORMAL LOW (ref 3.5–5.0)
Alkaline Phosphatase: 117 U/L (ref 38–126)
Alkaline Phosphatase: 84 U/L (ref 38–126)
Anion gap: 17 — ABNORMAL HIGH (ref 5–15)
Anion gap: 13 (ref 5–15)
BUN: 25 mg/dL — ABNORMAL HIGH (ref 6–20)
BUN: 19 mg/dL (ref 6–20)
CO2: 24 mmol/L (ref 22–32)
CO2: 23 mmol/L (ref 22–32)
Calcium: 9.6 mg/dL (ref 8.9–10.3)
Calcium: 9.1 mg/dL (ref 8.9–10.3)
Chloride: 89 mmol/L — ABNORMAL LOW (ref 98–111)
Chloride: 99 mmol/L (ref 98–111)
Creatinine, Ser: 1.99 mg/dL — ABNORMAL HIGH (ref 0.61–1.24)
Creatinine, Ser: 1.34 mg/dL — ABNORMAL HIGH (ref 0.61–1.24)
GFR calc Af Amer: 43 mL/min — ABNORMAL LOW (ref >60)
GFR calc Af Amer: >60 mL/min (ref >60)
GFR calc non Af Amer: 37 mL/min — ABNORMAL LOW (ref >60)
GFR calc non Af Amer: 60 mL/min — ABNORMAL LOW (ref >60)
Glucose, Bld: 812 mg/dL (ref 70–99)
Glucose, Bld: 163 mg/dL — ABNORMAL HIGH (ref 70–99)
Potassium: 5 mmol/L (ref 3.5–5.1)
Potassium: 3.6 mmol/L (ref 3.5–5.1)
Sodium: 130 mmol/L — ABNORMAL LOW (ref 135–145)
Sodium: 135 mmol/L (ref 135–145)
Total Bilirubin: 1 mg/dL (ref 0.3–1.2)
Total Bilirubin: 0.8 mg/dL (ref 0.3–1.2)
Total Protein: 6.7 g/dL (ref 6.5–8.1)
Total Protein: 6.1 g/dL — ABNORMAL LOW (ref 6.5–8.1)

## 2019-04-05 LAB — POC SARS CORONAVIRUS 2 AG -  ED: SARS Coronavirus 2 Ag: NEGATIVE

## 2019-04-05 LAB — CBG MONITORING, ED
Glucose-Capillary: >600 mg/dL (ref 70–99)
Glucose-Capillary: >600 mg/dL (ref 70–99)
Glucose-Capillary: 410 mg/dL — ABNORMAL HIGH (ref 70–99)
Glucose-Capillary: 503 mg/dL (ref 70–99)
Glucose-Capillary: 342 mg/dL — ABNORMAL HIGH (ref 70–99)
Glucose-Capillary: 203 mg/dL — ABNORMAL HIGH (ref 70–99)
Glucose-Capillary: 304 mg/dL — ABNORMAL HIGH (ref 70–99)
Glucose-Capillary: 205 mg/dL — ABNORMAL HIGH (ref 70–99)
Glucose-Capillary: 231 mg/dL — ABNORMAL HIGH (ref 70–99)

## 2019-04-05 LAB — URINALYSIS, ROUTINE W REFLEX MICROSCOPIC
Bacteria, UA: NONE SEEN
Bilirubin Urine: NEGATIVE
Glucose, UA: >=500 mg/dL — AB
Hgb urine dipstick: NEGATIVE
Ketones, ur: 5 mg/dL — AB
Leukocytes,Ua: NEGATIVE
Nitrite: NEGATIVE
Protein, ur: NEGATIVE mg/dL
Specific Gravity, Urine: 1.025 (ref 1.005–1.030)
pH: 5 (ref 5.0–8.0)

## 2019-04-05 LAB — POCT I-STAT EG7
Acid-Base Excess: 3 mmol/L — ABNORMAL HIGH (ref 0.0–2.0)
Bicarbonate: 25.4 mmol/L (ref 20.0–28.0)
Calcium, Ion: 1 mmol/L — ABNORMAL LOW (ref 1.15–1.40)
HCT: 46 % (ref 39.0–52.0)
Hemoglobin: 15.6 g/dL (ref 13.0–17.0)
O2 Saturation: 97 %
Potassium: 4.9 mmol/L (ref 3.5–5.1)
Sodium: 128 mmol/L — ABNORMAL LOW (ref 135–145)
TCO2: 26 mmol/L (ref 22–32)
pCO2, Ven: 30.6 mmHg — ABNORMAL LOW (ref 44.0–60.0)
pH, Ven: 7.528 — ABNORMAL HIGH (ref 7.250–7.430)
pO2, Ven: 76 mmHg — ABNORMAL HIGH (ref 32.0–45.0)

## 2019-04-05 LAB — GLUCOSE, CAPILLARY
Glucose-Capillary: 173 mg/dL — ABNORMAL HIGH (ref 70–99)
Glucose-Capillary: 174 mg/dL — ABNORMAL HIGH (ref 70–99)
Glucose-Capillary: 188 mg/dL — ABNORMAL HIGH (ref 70–99)

## 2019-04-05 LAB — MAGNESIUM: Magnesium: 1.8 mg/dL (ref 1.7–2.4)

## 2019-04-05 LAB — SARS CORONAVIRUS 2 (TAT 6-24 HRS): SARS Coronavirus 2: NEGATIVE

## 2019-04-05 LAB — BETA-HYDROXYBUTYRIC ACID: Beta-Hydroxybutyric Acid: 1.04 mmol/L — ABNORMAL HIGH (ref 0.05–0.27)

## 2019-04-05 LAB — HEMOGLOBIN A1C
Hgb A1c MFr Bld: 14 % — ABNORMAL HIGH (ref 4.8–5.6)
Mean Plasma Glucose: 355.1 mg/dL

## 2019-04-05 LAB — HIV ANTIBODY (ROUTINE TESTING W REFLEX): HIV Screen 4th Generation wRfx: NONREACTIVE

## 2019-04-05 LAB — PHOSPHORUS: Phosphorus: 3.4 mg/dL (ref 2.5–4.6)

## 2019-04-05 MED ORDER — ASPIRIN 81 MG PO CHEW
81.0000 mg | CHEWABLE_TABLET | Freq: Every day | ORAL | Status: DC
  Administered 2019-04-05 – 2019-04-07 (×3): 81 mg via ORAL
  Filled 2019-04-05 (×3): qty 1

## 2019-04-05 MED ORDER — PRAVASTATIN SODIUM 40 MG PO TABS
40.0000 mg | ORAL_TABLET | Freq: Every day | ORAL | Status: DC
  Administered 2019-04-05: 16:00:00 40 mg via ORAL
  Filled 2019-04-05: qty 1

## 2019-04-05 MED ORDER — VITAMIN D 25 MCG (1000 UNIT) PO TABS
1000.0000 [IU] | ORAL_TABLET | Freq: Every day | ORAL | Status: DC
  Administered 2019-04-05 – 2019-04-07 (×3): 1000 [IU] via ORAL
  Filled 2019-04-05 (×3): qty 1

## 2019-04-05 MED ORDER — NITROGLYCERIN 0.4 MG SL SUBL: 0.4000 mg | SUBLINGUAL_TABLET | SUBLINGUAL | Status: DC | PRN

## 2019-04-05 MED ORDER — IVABRADINE HCL 7.5 MG PO TABS
7.5000 mg | ORAL_TABLET | Freq: Two times a day (BID) | ORAL | Status: DC
  Administered 2019-04-05 – 2019-04-06 (×2): 7.5 mg via ORAL
  Filled 2019-04-05 (×2): qty 1

## 2019-04-05 MED ORDER — SODIUM POLYSTYRENE SULFONATE 15 GM/60ML PO SUSP
15.0000 g | Freq: Every day | ORAL | Status: DC
  Administered 2019-04-05: 15 g via ORAL
  Filled 2019-04-05 (×2): qty 60

## 2019-04-05 MED ORDER — TICAGRELOR 90 MG PO TABS
90.0000 mg | ORAL_TABLET | Freq: Two times a day (BID) | ORAL | Status: DC
  Administered 2019-04-05 (×2): 90 mg via ORAL
  Filled 2019-04-05 (×2): qty 1

## 2019-04-05 MED ORDER — HYDROCODONE-ACETAMINOPHEN 5-325 MG PO TABS: 1.0000 | ORAL_TABLET | ORAL | Status: DC | PRN

## 2019-04-05 MED ORDER — INSULIN REGULAR(HUMAN) IN NACL 100-0.9 UT/100ML-% IV SOLN
INTRAVENOUS | Status: DC
  Administered 2019-04-05: 16:00:00 1.3 [IU]/h via INTRAVENOUS

## 2019-04-05 MED ORDER — OXYCODONE HCL 5 MG PO TABS: 5.0000 mg | ORAL_TABLET | Freq: Four times a day (QID) | ORAL | Status: DC | PRN

## 2019-04-05 MED ORDER — DEXTROSE 50 % IV SOLN: 0.0000 mL | INTRAVENOUS | Status: DC | PRN

## 2019-04-05 MED ORDER — DEXTROSE-NACL 5-0.45 % IV SOLN: INTRAVENOUS | Status: DC

## 2019-04-05 MED ORDER — ROSUVASTATIN CALCIUM 20 MG PO TABS
40.0000 mg | ORAL_TABLET | Freq: Every day | ORAL | Status: DC
  Administered 2019-04-05: 16:00:00 40 mg via ORAL
  Filled 2019-04-05: qty 2

## 2019-04-05 MED ORDER — SODIUM CHLORIDE 0.9 % IV SOLN: INTRAVENOUS | Status: DC

## 2019-04-05 MED ORDER — PREDNISONE 5 MG PO TABS
15.0000 mg | ORAL_TABLET | Freq: Every day | ORAL | Status: DC
  Administered 2019-04-05 – 2019-04-07 (×3): 15 mg via ORAL
  Filled 2019-04-05 (×3): qty 3

## 2019-04-05 MED ORDER — VALGANCICLOVIR HCL 450 MG PO TABS
450.0000 mg | ORAL_TABLET | Freq: Every day | ORAL | Status: DC
  Administered 2019-04-05 – 2019-04-07 (×3): 450 mg via ORAL
  Filled 2019-04-05 (×3): qty 1

## 2019-04-05 MED ORDER — MYCOPHENOLATE MOFETIL 250 MG PO CAPS
1500.0000 mg | ORAL_CAPSULE | Freq: Two times a day (BID) | ORAL | Status: DC
  Administered 2019-04-05 – 2019-04-07 (×5): 1500 mg via ORAL
  Filled 2019-04-05 (×6): qty 6

## 2019-04-05 MED ORDER — ALLOPURINOL 100 MG PO TABS
100.0000 mg | ORAL_TABLET | Freq: Two times a day (BID) | ORAL | Status: DC
  Administered 2019-04-05 (×2): 100 mg via ORAL
  Filled 2019-04-05 (×3): qty 1

## 2019-04-05 MED ORDER — INSULIN GLARGINE 100 UNIT/ML ~~LOC~~ SOLN
15.0000 [IU] | SUBCUTANEOUS | Status: DC
  Administered 2019-04-06 – 2019-04-07 (×2): 15 [IU] via SUBCUTANEOUS
  Filled 2019-04-05 (×3): qty 0.15

## 2019-04-05 MED ORDER — HYDRALAZINE HCL 25 MG PO TABS
100.0000 mg | ORAL_TABLET | Freq: Three times a day (TID) | ORAL | Status: DC
  Filled 2019-04-05 (×2): qty 4

## 2019-04-05 MED ORDER — AMLODIPINE BESYLATE 10 MG PO TABS
10.0000 mg | ORAL_TABLET | Freq: Every day | ORAL | Status: DC
  Administered 2019-04-05 – 2019-04-07 (×3): 10 mg via ORAL
  Filled 2019-04-05 (×2): qty 1
  Filled 2019-04-05: qty 2

## 2019-04-05 MED ORDER — ATOVAQUONE 750 MG/5ML PO SUSP
1500.0000 mg | Freq: Every day | ORAL | Status: DC
  Administered 2019-04-05 – 2019-04-07 (×3): 1500 mg via ORAL
  Filled 2019-04-05 (×3): qty 10

## 2019-04-05 MED ORDER — ISOSORBIDE MONONITRATE ER 30 MG PO TB24
30.0000 mg | ORAL_TABLET | Freq: Every day | ORAL | Status: DC
  Administered 2019-04-05: 16:00:00 30 mg via ORAL
  Filled 2019-04-05: qty 1

## 2019-04-05 MED ORDER — CALCIUM CARBONATE-VITAMIN D 500-200 MG-UNIT PO TABS
1.0000 | ORAL_TABLET | Freq: Every day | ORAL | Status: DC
  Administered 2019-04-06 – 2019-04-07 (×2): 1 via ORAL
  Filled 2019-04-05 (×3): qty 1

## 2019-04-05 MED ORDER — NYSTATIN 100000 UNIT/ML MT SUSP
5.0000 mL | Freq: Four times a day (QID) | OROMUCOSAL | Status: DC
  Administered 2019-04-05 – 2019-04-07 (×7): 500000 [IU] via ORAL
  Filled 2019-04-05 (×9): qty 5

## 2019-04-05 MED ORDER — OMEGA-3-ACID ETHYL ESTERS 1 G PO CAPS
1.0000 g | ORAL_CAPSULE | Freq: Every day | ORAL | Status: DC
  Administered 2019-04-05: 16:00:00 1 g via ORAL
  Filled 2019-04-05: qty 1

## 2019-04-05 MED ORDER — SODIUM CHLORIDE 0.9 % IV BOLUS
500.0000 mL | Freq: Once | INTRAVENOUS | Status: AC
  Administered 2019-04-05: 500 mL via INTRAVENOUS

## 2019-04-05 MED ORDER — INSULIN REGULAR(HUMAN) IN NACL 100-0.9 UT/100ML-% IV SOLN
INTRAVENOUS | Status: DC
  Administered 2019-04-05: 10.5 [IU]/h via INTRAVENOUS
  Filled 2019-04-05: qty 100

## 2019-04-05 MED ORDER — CARVEDILOL 12.5 MG PO TABS
12.5000 mg | ORAL_TABLET | Freq: Two times a day (BID) | ORAL | Status: DC
  Administered 2019-04-05 – 2019-04-07 (×4): 12.5 mg via ORAL
  Filled 2019-04-05 (×5): qty 1

## 2019-04-05 MED ORDER — LACTULOSE 10 GM/15ML PO SOLN
20.0000 g | Freq: Every day | ORAL | Status: DC
  Administered 2019-04-05: 20 g via ORAL
  Filled 2019-04-05: qty 30

## 2019-04-05 MED ORDER — TACROLIMUS 1 MG PO CAPS
6.0000 mg | ORAL_CAPSULE | Freq: Two times a day (BID) | ORAL | Status: DC
  Administered 2019-04-05 (×2): 6 mg via ORAL
  Filled 2019-04-05 (×3): qty 6

## 2019-04-05 MED ORDER — INSULIN ASPART 100 UNIT/ML ~~LOC~~ SOLN
0.0000 [IU] | SUBCUTANEOUS | Status: DC
  Administered 2019-04-06: 7 [IU] via SUBCUTANEOUS
  Administered 2019-04-06: 5 [IU] via SUBCUTANEOUS
  Administered 2019-04-06 (×3): 2 [IU] via SUBCUTANEOUS
  Administered 2019-04-06: 17:00:00 7 [IU] via SUBCUTANEOUS
  Administered 2019-04-07: 08:00:00 2 [IU] via SUBCUTANEOUS
  Administered 2019-04-07 (×3): 3 [IU] via SUBCUTANEOUS

## 2019-04-05 MED ORDER — PANTOPRAZOLE SODIUM 40 MG PO TBEC
40.0000 mg | DELAYED_RELEASE_TABLET | Freq: Every day | ORAL | Status: DC
  Administered 2019-04-05 – 2019-04-07 (×3): 40 mg via ORAL
  Filled 2019-04-05 (×3): qty 1

## 2019-04-05 MED ORDER — TORSEMIDE 20 MG PO TABS
10.0000 mg | ORAL_TABLET | Freq: Every day | ORAL | Status: DC
  Administered 2019-04-05: 16:00:00 10 mg via ORAL
  Filled 2019-04-05: qty 1

## 2019-04-05 MED ORDER — SENNOSIDES-DOCUSATE SODIUM 8.6-50 MG PO TABS: 2.0000 | ORAL_TABLET | Freq: Every day | ORAL | Status: DC | PRN

## 2019-04-05 MED ORDER — DOXAZOSIN MESYLATE 2 MG PO TABS
2.0000 mg | ORAL_TABLET | Freq: Every day | ORAL | Status: DC
  Administered 2019-04-05 – 2019-04-06 (×2): 2 mg via ORAL
  Filled 2019-04-05 (×3): qty 1

## 2019-04-05 MED ORDER — EZETIMIBE 10 MG PO TABS
10.0000 mg | ORAL_TABLET | Freq: Every day | ORAL | Status: DC
  Administered 2019-04-05 – 2019-04-07 (×3): 10 mg via ORAL
  Filled 2019-04-05 (×3): qty 1

## 2019-04-05 MED ORDER — MAGNESIUM OXIDE 400 (241.3 MG) MG PO TABS
400.0000 mg | ORAL_TABLET | Freq: Two times a day (BID) | ORAL | Status: DC
  Administered 2019-04-05 – 2019-04-07 (×5): 400 mg via ORAL
  Filled 2019-04-05 (×5): qty 1

## 2019-04-05 NOTE — H&P (Signed)
History and Physical    Marvin Martin SLH:[id number] DOB: 09/10/1965 DOA: 04/05/2019  PCP: Charlott Rakes, MD  Patient coming from: Home I have personally briefly reviewed patient's old medical records in [place]  Chief Complaint: Hyperglycemia HPI: Marvin Martin is a 54 y.o. male with medical history significant of ischemic cardiomyopathy status post heart transplant and kidney transplant performed at [facility] in 12/2018-on chronic immunosuppression (prednisone/tacrolimus/mycophenolate) diabetes mellitus-on 10 to 15 units of regular insulin 3 times daily with meals, hypertension, hyperlipidemia, CKD stage III, GERD, chronic constipation presents to emergency department for the concern of hyperglycemia.  Patient tells me that he had regular blood work-up yesterday and his blood sugar was found to be more than 600 and he received a call this morning and advised to come to the ED for further evaluation and management.  Patient tells me that he has been sluggish since past couple of weeks.  No history of fever, chills, nausea, vomiting, diarrhea, abdominal pain, cough, congestion, decreased appetite, dysuria, hematuria, back pain, weight loss.  Patient has been compliant with his medication and follow-up appointments.  He takes 10 units of regular insulin 3 times daily and 15 units only if blood sugar is more than 200.  He lives with his fiance at home.  No history of smoking, alcohol, is a drug use.  ED Course: Upon arrival to ED: Patient's vital signs stable.  Blood sugar: High, UA negative, COVID-19 negative, chest x-ray negative, patient is afebrile with no leukocytosis.  CMP shows stable CKD, sodium: 130, chloride: 89, anion gap: 17.  Patient started on insulin drip and received IV fluids in the ED.  [facility] hospitalist consulted for admission for hyperglycemia.  Review of Systems: As per HPI otherwise negative.    Past Medical History:  Diagnosis Date  . Chronic systolic CHF  (congestive heart failure) ([place])    a. 01/2015 Echo: EF 20-25%, antlat, inflat AK.  [name] CKD (chronic kidney disease), stage III   . Coronary artery disease    a. s/p MI x 4;  b. Reported h/o 17 stents with recurrent ISR;  c. 02/2014 Cath ([place]): PCI to unknown vessel;  d. 01/2015 MV: EF 18%, large scar, no ischemia; e. 03/2015 PCI: LM nl, LAD nl, D2 patent stent, RI patent stent, LCX patent stents p/m, RCA 95p ISR (PTCA), 67m, 100d into RPL CTO, RPDA 90 (2.5x16 Synergy DES).  . Hyperlipidemia   . Hypertensive heart disease   . Ischemic cardiomyopathy    a. s/p SJM ICD 2007 w/ gen change in 2012; b. 12/2014 CPX Test: mild to mod HF limitation w/ pVO2 20.3 and nl slope;  c. 01/2015 Echo: EF 20-25%.    Past Surgical History:  Procedure Laterality Date  . 17 stints placements Bilateral   . CARDIAC CATHETERIZATION     Most recent 02/2014   . CARDIAC CATHETERIZATION N/A 03/31/2015   Procedure: Left Heart Cath and Coronary Angiography;  Surgeon: Troy Sine, MD;  Location: [place] [facility];  Service: Cardiovascular;  Laterality: N/A;  . CARDIAC CATHETERIZATION N/A 03/31/2015   Procedure: Coronary Stent Intervention;  Surgeon: Troy Sine, MD;  Location: [place] [facility];  Service: Cardiovascular;  Laterality: N/A;  . CARDIAC CATHETERIZATION N/A 05/27/2015   Procedure: Right/Left Heart Cath and Coronary Angiography;  Surgeon: Jolaine Artist, MD;  Location: [place] [facility];  Service: Cardiovascular;  Laterality: N/A;  . CARDIAC CATHETERIZATION N/A 06/23/2015   Procedure: Right/Left Heart Cath and Coronary Angiography;  Surgeon: Quillian Quince  R Bensimhon, MD;  Location: [place] [facility];  Service: Cardiovascular;  Laterality: N/A;  . HEART TRANSPLANT    . INSERT / REPLACE / REMOVE PACEMAKER     St jude 2006  Generator Change 2012   . NEPHRECTOMY TRANSPLANTED ORGAN    . PACEMAKER GENERATOR CHANGE Bilateral      reports that he has never smoked. He has never used smokeless tobacco. He reports that he  does not drink alcohol or use drugs.  No Known Allergies  Family History  Problem Relation Age of Onset  . Cancer Mother        died when pt was 1.  She had a h/o heart dzs as well.  . Hypertension Mother   . Hyperlipidemia Father        Father died when pt was age 57 - he believes that he had a cardiac hx.  [name] CAD Sister     Prior to Admission medications   Medication Sig Start Date End Date Taking? Authorizing Provider  Alirocumab (PRALUENT) 75 MG/ML SOAJ Inject 75 mg into the skin every 14 (fourteen) days. 07/09/18  Yes Bensimhon, Shaune Pascal, MD  allopurinol (ZYLOPRIM) 100 MG tablet TAKE 1 TABLET BY MOUTH TWICE A DAY 06/28/18  Yes Newlin, Enobong, MD  amLODipine (NORVASC) 10 MG tablet TAKE 1 TABLET BY MOUTH EVERY DAY 11/15/18  Yes Bensimhon, Shaune Pascal, MD  aspirin 81 MG tablet Take 81 mg by mouth daily.   Yes [provider]  atovaquone (MEPRON) 750 MG/5ML suspension Take 10 mLs by mouth daily. 01/23/19  Yes [provider]  Calcium Carbonate-Vitamin D 600-400 MG-UNIT tablet Take 1 tablet by mouth daily. 01/06/19 01/06/20 Yes [provider]  carvedilol (COREG) 12.5 MG tablet Take 12.5 mg by mouth 2 (two) times daily. 03/23/19  Yes [provider]  cholecalciferol (VITAMIN D) 1000 UNITS tablet Take 1,000 Units by mouth daily.   Yes [provider]  CORLANOR 7.5 MG TABS tablet TAKE 1 TABLET (7.5 MG TOTAL) BY MOUTH 2 (TWO) TIMES DAILY WITH A MEAL. Patient taking differently: Take 7.5 mg by mouth 2 (two) times daily with a meal.  06/21/18  Yes Bensimhon, Shaune Pascal, MD  doxazosin (CARDURA) 2 MG tablet Take 2 mg by mouth at bedtime. 03/11/19  Yes [provider]  ezetimibe (ZETIA) 10 MG tablet TAKE 1 TABLET BY MOUTH EVERY DAY 11/15/18  Yes Bensimhon, Shaune Pascal, MD  hydrALAZINE (APRESOLINE) 100 MG tablet Take 1 tablet (100 mg total) by mouth 3 (three) times daily. 10/23/18  Yes Bensimhon, Shaune Pascal, MD  HYDROcodone-acetaminophen (NORCO/VICODIN) 5-325 MG  tablet Take 1 tablet by mouth every 4 (four) hours as needed. 12/16/17  Yes Recardo Evangelist, PA-C  insulin regular (NOVOLIN R) 100 units/mL injection Inject 10 Units into the skin 3 (three) times daily before meals. If BG is over 200 01/07/19 01/07/20 Yes [provider]  isosorbide mononitrate (IMDUR) 30 MG 24 hr tablet Take 1 tablet (30 mg total) by mouth daily. 10/14/18 04/05/19 Yes Clegg, Amy D, NP  lactulose (CHRONULAC) 10 GM/15ML solution Take 30 mLs by mouth daily. 01/22/19  Yes [provider]  magnesium oxide (MAG-OX) 400 (241.3 Mg) MG tablet Take 1 tablet by mouth 2 (two) times daily. 03/11/19  Yes [provider]  mycophenolate (CELLCEPT) 500 MG tablet Take 1,500 mg by mouth in the morning and at bedtime. 01/06/19 01/06/20 Yes [provider]  nitroGLYCERIN (NITROSTAT) 0.4 MG SL tablet Place 1 tablet (0.4 mg total)  under the tongue every 5 (five) minutes as needed for chest pain. Reported on 04/12/2015 06/03/15  Yes Charlott Rakes, MD  nystatin (MYCOSTATIN) 100000 UNIT/ML suspension Take 5 mLs by mouth 4 (four) times daily. 02/21/19  Yes [provider]  omega-3 acid ethyl esters (LOVAZA) 1 G capsule Take 1 g by mouth daily.    Yes [provider]  ondansetron (ZOFRAN) 4 MG tablet Take 1 tablet (4 mg total) every 8 (eight) hours as needed by mouth for nausea or vomiting. Reported on 05/24/2015 11/20/16  Yes Clegg, Amy D, NP  oxyCODONE (OXY IR/ROXICODONE) 5 MG immediate release tablet Take 5 mg by mouth 4 (four) times daily as needed. 01/07/19  Yes [provider]  pantoprazole (PROTONIX) 40 MG tablet Take 1 tablet by mouth daily. 01/07/19 01/07/20 Yes [provider]  pravastatin (PRAVACHOL) 40 MG tablet Take 40 mg by mouth daily. 02/05/19  Yes [provider]  predniSONE (DELTASONE) 5 MG tablet Take 15 mg by mouth daily. 03/26/19  Yes [provider]  rosuvastatin (CRESTOR) 40 MG tablet TAKE 1 TABLET BY MOUTH  EVERY DAY 09/24/18  Yes Bensimhon, Shaune Pascal, MD  senna-docusate (SENOKOT-S) 8.6-50 MG tablet Take 2 tablets by mouth daily as needed. 01/08/19 01/08/20 Yes [provider]  SPS 15 GM/60ML suspension Take 15 g by mouth daily. 01/14/19  Yes [provider]  tacrolimus (PROGRAF) 1 MG capsule Take 6 mg by mouth in the morning and at bedtime. 03/11/19 03/10/20 Yes [provider]  ticagrelor (BRILINTA) 90 MG TABS tablet Take 1 tablet (90 mg total) by mouth 2 (two) times daily. 12/16/18  Yes Bensimhon, Shaune Pascal, MD  torsemide (DEMADEX) 10 MG tablet Take 10 mg by mouth daily. 01/08/19  Yes [provider]  valGANciclovir (VALCYTE) 450 MG tablet Take 450 mg by mouth daily. 01/06/19  Yes [provider]    Physical Exam: Vitals:   04/05/19 1130 04/05/19 1146 04/05/19 1230 04/05/19 1300  BP: (!) 145/108 (!) 145/108 (!) 127/96 (!) 109/92  Pulse: 95 87 87 85  Resp:  14 18 16   Temp:      TempSrc:      SpO2: 100% 100% 100% 98%  Weight:      Height:        Constitutional: NAD, calm, comfortable, communicating well, on room air Eyes: PERRL, lids and conjunctivae normal ENMT: Mucous membranes are moist. Posterior pharynx clear of any exudate or lesions.Normal dentition.  Neck: normal, supple, no masses, no thyromegaly Respiratory: clear to auscultation bilaterally, no wheezing, no crackles. Normal respiratory effort. No accessory muscle use.  Cardiovascular: Regular rate and rhythm, no murmurs / rubs / gallops. No extremity edema. 2+ pedal pulses. No carotid bruits.  Abdomen: no tenderness, no masses palpated. No hepatosplenomegaly. Bowel sounds positive.  Musculoskeletal: no clubbing / cyanosis. No joint deformity upper and lower extremities. Good ROM, no contractures. Normal muscle tone.  Skin: no rashes, lesions, ulcers. No induration Neurologic: CN 2-12 grossly intact. Sensation intact, DTR normal. Strength 5/5 in all 4.  Psychiatric: Normal judgment and  insight. Alert and oriented x 3. Normal mood.    Labs on Admission: I have personally reviewed following labs and imaging studies  CBC: Recent Labs  Lab 04/05/19 0956 04/05/19 1159  WBC 7.4  --   HGB 15.0 15.6  HCT 43.8 46.0  MCV 92.0  --   PLT 209  --    Basic Metabolic Panel: Recent Labs  Lab 04/05/19 0956 04/05/19 1159  NA  130* 128*  K 5.0 4.9  CL 89*  --   CO2 24  --   GLUCOSE 812*  --   BUN 25*  --   CREATININE 1.99*  --   CALCIUM 9.6  --    GFR: Estimated Creatinine Clearance: 40.4 mL/min (A) (by C-G formula based on SCr of 1.99 mg/dL (H)). Liver Function Tests: Recent Labs  Lab 04/05/19 0956  AST 31  ALT 27  ALKPHOS 117  BILITOT 1.0  PROT 6.7  ALBUMIN 3.7   No results for input(s): LIPASE, AMYLASE in the last 168 hours. No results for input(s): AMMONIA in the last 168 hours. Coagulation Profile: No results for input(s): INR, PROTIME in the last 168 hours. Cardiac Enzymes: No results for input(s): CKTOTAL, CKMB, CKMBINDEX, TROPONINI in the last 168 hours. BNP (last 3 results) No results for input(s): PROBNP in the last 8760 hours. HbA1C: No results for input(s): HGBA1C in the last 72 hours. CBG: Recent Labs  Lab 04/05/19 0955 04/05/19 1150 04/05/19 1237 04/05/19 1309 04/05/19 1333  GLUCAP >600* >600* 503* 410* 342*   Lipid Profile: No results for input(s): CHOL, HDL, LDLCALC, TRIG, CHOLHDL, LDLDIRECT in the last 72 hours. Thyroid Function Tests: No results for input(s): TSH, T4TOTAL, FREET4, T3FREE, THYROIDAB in the last 72 hours. Anemia Panel: No results for input(s): VITAMINB12, FOLATE, FERRITIN, TIBC, IRON, RETICCTPCT in the last 72 hours. Urine analysis:    Component Value Date/Time   COLORURINE STRAW (A) 04/05/2019 1136   APPEARANCEUR CLEAR 04/05/2019 1136   LABSPEC 1.025 04/05/2019 1136   PHURINE 5.0 04/05/2019 1136   GLUCOSEU >=500 (A) 04/05/2019 1136   HGBUR NEGATIVE 04/05/2019 1136   BILIRUBINUR NEGATIVE 04/05/2019 1136    KETONESUR 5 (A) 04/05/2019 1136   PROTEINUR NEGATIVE 04/05/2019 1136   NITRITE NEGATIVE 04/05/2019 1136   LEUKOCYTESUR NEGATIVE 04/05/2019 1136    Radiological Exams on Admission: DG Chest 2 View  Result Date: 04/05/2019 CLINICAL DATA:  Fatigue. EXAM: CHEST - 2 VIEW COMPARISON:  June 21, 2015. FINDINGS: The heart size and mediastinal contours are within normal limits. Both lungs are clear. No pneumothorax or pleural effusion is noted. Sternotomy wires are noted. The visualized skeletal structures are unremarkable. IMPRESSION: No active cardiopulmonary disease. Electronically Signed   By: Marijo Conception M.D.   On: 04/05/2019 11:37    EKG: Independently reviewed.  Normal sinus rhythm.Probable right ventricular hypertrophy, ST elevation, probable normal early repolarization pattern.  Assessment/Plan Principal Problem:   Drug-induced hyperglycemia Active Problems:   CAD in native artery   Benign essential HTN   CKD (chronic kidney disease) stage 3, GFR 30-59 ml/min   Hyperlipidemia   Gout   Renal transplant, status post   H/O heart transplant ([place])   Severe hyperglycemia without DKA/HHS: -Patient has history of diabetes-last A1c checked on 02/24/19 which was 6.7%.  Takes regular insulin 10 to 15 units 3 times daily with meals at home. -Patient presented with severe hyperglycemia, blood glucose: 812 upon arrival -Anion gap: 17, pH: 7.5, bicarb: 25.4.  His elevated blood sugar is likely secondary to chronic steroid use. -Patient is afebrile with no leukocytosis, UA is negative, chest x-ray is negative for pneumonia.  COVID-19 negative. -Admit patient to stepdown unit for close monitoring. -Continue insulin drip.  Monitor blood sugar closely-CBG every 4 hours. -N.p.o. except meds.  Continue IV fluids. -Strict INO's and daily weight.  Monitor signs for fluid overload. -Consulted diabetic counselor and PT.  Hypertension: Well-controlled -Continue home meds of amlodipine, doxazosin, Coreg,  Imdur, hydralazine and torsemide.  Monitor blood pressure closely.  Hyperlipidemia: Continue Crestor, Pravachol, Zetia, Lovaza  Status post heart and renal transplant: Stable -Continue home immunosuppression: Prednisone, tacrolimus, mycophenolate atovaquone, Corlanor and Valcyte  CKD stage III: S/p renal transplant: Stable -Continue tacrolimus -Continue to monitor  Gout: Stable, continue allopurinol  GERD: Stable, continue PPI  Chronic constipation: Stable, continue lactulose  DVT prophylaxis: Brilinta/SCD/TED  code Status: Full code Family Communication: Patient's fianc present at bedside.  Plan of care discussed with patient and his fiance in length and they verbalized understanding and agreed with it. Disposition Plan: To be determined Consults called: None Admission status: Inpatient   Mckinley Jewel MD [facility] Pager [phone]  If 7PM-7AM, please contact night-coverage [email or web address] Password [email or web address]  04/05/2019, 1:39 PM

## 2019-04-05 NOTE — ED Triage Notes (Signed)
Pt states he received a call from [facility] this morning that his lab work came back from yesterday and "something was over 600".  Pt states he is unsure what is was and feels fine other than feeling sluggish for the past 2 weeks.  CBG reads "HIGH"

## 2019-04-05 NOTE — Plan of Care (Signed)

## 2019-04-05 NOTE — ED Provider Notes (Signed)
[place] EMERGENCY DEPARTMENT Provider Note   CSN: [id number] Arrival date & time: 04/05/19  0946     History Chief Complaint  Patient presents with  . abnormal labs    Marvin Martin is a 54 y.o. male.  Patient with history of heart transplant secondary to ischemic cardiomyopathy and deceased donor kidney transplant performed at [facility] in 12/2018, immunosuppressed on prednisone/tacrolimus/mycophenalate, history of diabetes, antiplatelet ticagralor -- presents to the emergency department today with elevated blood sugar.   Patient states that he has been feeling rundown and very fatigued over the past 2 weeks.  He discussed this with his doctor who sent him for blood work yesterday.  Today he was called by a doctor at [facility] regarding elevated blood sugars and told to go to the emergency department.  Patient denies any fevers or URI symptoms.  He has been drinking a lot of fluids.  Endorses decreased appetite due to change in taste, states that foods taste "sour".  He denies any chest pains or shortness of breath.  No cough or wheezing.  No nausea, vomiting, diarrhea.  No urinary symptoms.  No leg swelling or skin rashes.  Patient is on a insulin regimen for elevated blood sugars.        Past Medical History:  Diagnosis Date  . Chronic systolic CHF (congestive heart failure) ([place])    a. 01/2015 Echo: EF 20-25%, antlat, inflat AK.  [name] CKD (chronic kidney disease), stage III   . Coronary artery disease    a. s/p MI x 4;  b. Reported h/o 17 stents with recurrent ISR;  c. 02/2014 Cath ([place]): PCI to unknown vessel;  d. 01/2015 MV: EF 18%, large scar, no ischemia; e. 03/2015 PCI: LM nl, LAD nl, D2 patent stent, RI patent stent, LCX patent stents p/m, RCA 95p ISR (PTCA), 5m, 100d into RPL CTO, RPDA 90 (2.5x16 Synergy DES).  . Hyperlipidemia   . Hypertensive heart disease   . Ischemic cardiomyopathy    a. s/p SJM ICD 2007 w/ gen change in 2012; b. 12/2014 CPX Test: mild to mod  HF limitation w/ pVO2 20.3 and nl slope;  c. 01/2015 Echo: EF 20-25%.    Patient Active Problem List   Diagnosis Date Noted  . Gout 01/07/2018  . Unstable angina ([place]) 06/22/2015  . Recurrent angina status post coronary stent placement ([place])   . Hypertensive heart disease 04/03/2015  . Hyperlipidemia 04/03/2015  . ST elevation (STEMI) myocardial infarction involving right coronary artery ([place]) 03/31/2015  . Ischemic chest pain ([place])   . Cardiomyopathy, ischemic   . CKD (chronic kidney disease) stage 3, GFR 30-59 ml/min 02/09/2015  . Chronic systolic heart failure ([place]) 01/07/2015  . CAD in native artery 01/07/2015  . Benign essential HTN 01/07/2015  . CHF (congestive heart failure) ([place]) 12/11/2014    Past Surgical History:  Procedure Laterality Date  . 17 stints placements Bilateral   . CARDIAC CATHETERIZATION     Most recent 02/2014   . CARDIAC CATHETERIZATION N/A 03/31/2015   Procedure: Left Heart Cath and Coronary Angiography;  Surgeon: Troy Sine, MD;  Location: [place] CV LAB;  Service: Cardiovascular;  Laterality: N/A;  . CARDIAC CATHETERIZATION N/A 03/31/2015   Procedure: Coronary Stent Intervention;  Surgeon: Troy Sine, MD;  Location: [place] CV LAB;  Service: Cardiovascular;  Laterality: N/A;  . CARDIAC CATHETERIZATION N/A 05/27/2015   Procedure: Right/Left Heart Cath and Coronary Angiography;  Surgeon: Jolaine Artist, MD;  Location: [place]  CV LAB;  Service: Cardiovascular;  Laterality: N/A;  . CARDIAC CATHETERIZATION N/A 06/23/2015   Procedure: Right/Left Heart Cath and Coronary Angiography;  Surgeon: Jolaine Artist, MD;  Location: [place] CV LAB;  Service: Cardiovascular;  Laterality: N/A;  . HEART TRANSPLANT    . INSERT / REPLACE / REMOVE PACEMAKER     St jude 2006  Generator Change 2012   . NEPHRECTOMY TRANSPLANTED ORGAN    . PACEMAKER GENERATOR CHANGE Bilateral        Family History  Problem Relation Age of Onset  . Cancer Mother          died when pt was 50.  She had a h/o heart dzs as well.  . Hypertension Mother   . Hyperlipidemia Father        Father died when pt was age 54 - he believes that he had a cardiac hx.  [name] CAD Sister     Social History   Tobacco Use  . Smoking status: Never Smoker  . Smokeless tobacco: Never Used  Substance Use Topics  . Alcohol use: No    Alcohol/week: 0.0 standard drinks  . Drug use: No    Home Medications Prior to Admission medications   Medication Sig Start Date End Date Taking? Authorizing Provider  Alirocumab (PRALUENT) 75 MG/ML SOAJ Inject 75 mg into the skin every 14 (fourteen) days. 07/09/18   Bensimhon, Shaune Pascal, MD  allopurinol (ZYLOPRIM) 100 MG tablet TAKE 1 TABLET BY MOUTH TWICE A DAY 06/28/18   Charlott Rakes, MD  amLODipine (NORVASC) 10 MG tablet TAKE 1 TABLET BY MOUTH EVERY DAY 11/15/18   Bensimhon, Shaune Pascal, MD  aspirin 81 MG tablet Take 81 mg by mouth daily.    [provider]  cholecalciferol (VITAMIN D) 1000 UNITS tablet Take 1,000 Units by mouth daily.    [provider]  CORLANOR 7.5 MG TABS tablet TAKE 1 TABLET (7.5 MG TOTAL) BY MOUTH 2 (TWO) TIMES DAILY WITH A MEAL. 06/21/18   Bensimhon, Shaune Pascal, MD  ezetimibe (ZETIA) 10 MG tablet TAKE 1 TABLET BY MOUTH EVERY DAY 11/15/18   Bensimhon, Shaune Pascal, MD  hydrALAZINE (APRESOLINE) 100 MG tablet Take 1 tablet (100 mg total) by mouth 3 (three) times daily. 10/23/18   Bensimhon, Shaune Pascal, MD  HYDROcodone-acetaminophen (NORCO/VICODIN) 5-325 MG tablet Take 1 tablet by mouth every 4 (four) hours as needed. 12/16/17   Recardo Evangelist, PA-C  isosorbide mononitrate (IMDUR) 30 MG 24 hr tablet Take 1 tablet (30 mg total) by mouth daily. 10/14/18 01/12/19  Clegg, Amy D, NP  Magnesium Oxide 200 MG TABS Take 2 tablets (400 mg total) by mouth 2 (two) times daily. 02/18/18   Bensimhon, Shaune Pascal, MD  MILRINONE LACTATE IN DEXTROSE IV Inject into the vein.    [provider]  nitroGLYCERIN (NITROSTAT) 0.4 MG SL  tablet Place 1 tablet (0.4 mg total) under the tongue every 5 (five) minutes as needed for chest pain. Reported on 04/12/2015 06/03/15   Charlott Rakes, MD  omega-3 acid ethyl esters (LOVAZA) 1 G capsule Take 1 g by mouth daily.     [provider]  ondansetron (ZOFRAN) 4 MG tablet Take 1 tablet (4 mg total) every 8 (eight) hours as needed by mouth for nausea or vomiting. Reported on 05/24/2015 11/20/16   Darrick Grinder D, NP  rosuvastatin (CRESTOR) 40 MG tablet TAKE 1 TABLET BY MOUTH EVERY DAY 09/24/18   Bensimhon, Shaune Pascal, MD  ticagrelor (BRILINTA) 90 MG TABS  tablet Take 1 tablet (90 mg total) by mouth 2 (two) times daily. 12/16/18   Bensimhon, Shaune Pascal, MD    Allergies    Patient has no known allergies.  Review of Systems   Review of Systems  Constitutional: Positive for appetite change and fatigue. Negative for fever.  HENT: Negative for rhinorrhea and sore throat.        + change in taste  Eyes: Negative for redness.  Respiratory: Negative for cough.   Cardiovascular: Negative for chest pain.  Gastrointestinal: Negative for abdominal pain, diarrhea, nausea and vomiting.  Genitourinary: Negative for dysuria.  Musculoskeletal: Negative for myalgias.  Skin: Negative for rash.  Neurological: Positive for weakness (generalized). Negative for headaches.    Physical Exam Updated Vital Signs BP (!) 125/98 (BP Location: Left Arm)   Pulse (!) 102   Temp 98 F (36.7 C) (Oral)   Resp 18   SpO2 95%   Physical Exam Vitals and nursing note reviewed.  Constitutional:      Appearance: He is well-developed.  HENT:     Head: Normocephalic and atraumatic.     Right Ear: External ear normal.     Left Ear: External ear normal.     Nose: Nose normal. No congestion.     Mouth/Throat:     Mouth: Mucous membranes are moist.     Pharynx: No oropharyngeal exudate.  Eyes:     General:        Right eye: No discharge.        Left eye: No discharge.     Conjunctiva/sclera: Conjunctivae normal.   Cardiovascular:     Rate and Rhythm: Normal rate and regular rhythm.     Heart sounds: Normal heart sounds.     Comments: Well healing and clean sternonomy and abdominal surgical scar, no sign of infection.  Pulmonary:     Effort: Pulmonary effort is normal.     Breath sounds: Normal breath sounds.  Abdominal:     Palpations: Abdomen is soft.     Tenderness: There is no abdominal tenderness. There is no guarding or rebound.  Musculoskeletal:     Cervical back: Normal range of motion and neck supple.     Right lower leg: No edema.     Left lower leg: No edema.  Skin:    General: Skin is warm and dry.  Neurological:     Mental Status: He is alert.     ED Results / Procedures / Treatments   Labs (all labs ordered are listed, but only abnormal results are displayed) Labs Reviewed  URINALYSIS, ROUTINE W REFLEX MICROSCOPIC - Abnormal; Notable for the following components:      Result Value   Color, Urine STRAW (*)    Glucose, UA >=500 (*)    Ketones, ur 5 (*)    All other components within normal limits  COMPREHENSIVE METABOLIC PANEL - Abnormal; Notable for the following components:   Sodium 130 (*)    Chloride 89 (*)    Glucose, Bld 812 (*)    BUN 25 (*)    Creatinine, Ser 1.99 (*)    GFR calc non Af Amer 37 (*)    GFR calc Af Amer 43 (*)    Anion gap 17 (*)    All other components within normal limits  BETA-HYDROXYBUTYRIC ACID - Abnormal; Notable for the following components:   Beta-Hydroxybutyric Acid 1.04 (*)    All other components within normal limits  CBG MONITORING, ED - Abnormal; Notable  for the following components:   Glucose-Capillary >600 (*)    All other components within normal limits  CBG MONITORING, ED - Abnormal; Notable for the following components:   Glucose-Capillary >600 (*)    All other components within normal limits  POCT I-STAT EG7 - Abnormal; Notable for the following components:   pH, Ven 7.528 (*)    pCO2, Ven 30.6 (*)    pO2, Ven 76.0 (*)     Acid-Base Excess 3.0 (*)    Sodium 128 (*)    Calcium, Ion 1.00 (*)    All other components within normal limits  CBG MONITORING, ED - Abnormal; Notable for the following components:   Glucose-Capillary 503 (*)    All other components within normal limits  CBG MONITORING, ED - Abnormal; Notable for the following components:   Glucose-Capillary 410 (*)    All other components within normal limits  SARS CORONAVIRUS 2 (TAT 6-24 HRS)  CBC  POC SARS CORONAVIRUS 2 AG -  ED  I-STAT VENOUS BLOOD GAS, ED    EKG EKG Interpretation  Date/Time:  Saturday April 05 2019 10:43:55 EDT Ventricular Rate:  85 PR Interval:    QRS Duration: 99 QT Interval:  352 QTC Calculation: 419 R Axis:   137 Text Interpretation: Sinus rhythm Probable right ventricular hypertrophy ST elev, probable normal early repol pattern No STEMI Confirmed by Octaviano Glow [phone]) on 04/05/2019 11:20:32 AM   Radiology DG Chest 2 View  Result Date: 04/05/2019 CLINICAL DATA:  Fatigue. EXAM: CHEST - 2 VIEW COMPARISON:  June 21, 2015. FINDINGS: The heart size and mediastinal contours are within normal limits. Both lungs are clear. No pneumothorax or pleural effusion is noted. Sternotomy wires are noted. The visualized skeletal structures are unremarkable. IMPRESSION: No active cardiopulmonary disease. Electronically Signed   By: Marijo Conception M.D.   On: 04/05/2019 11:37    Procedures Procedures (including critical care time)  Medications Ordered in ED Medications  insulin regular, human (MYXREDLIN) 100 units/ 100 mL infusion (5.5 Units/hr Intravenous Rate/Dose Change 04/05/19 1313)  0.9 %  sodium chloride infusion ( Intravenous New Bag/Given 04/05/19 1156)  dextrose 5 %-0.45 % sodium chloride infusion ( Intravenous Hold 04/05/19 1157)  dextrose 50 % solution 0-50 mL (has no administration in time range)  sodium chloride 0.9 % bolus 500 mL (0 mLs Intravenous Stopped 04/05/19 1244)    ED Course  I have reviewed the  triage vital signs and the nursing notes.  Pertinent labs & imaging results that were available during my care of the patient were reviewed by me and considered in my medical decision making (see chart for details).  Patient seen and examined.  Reviewed notes from [place] in care everywhere.  Lab work-up ordered.  Added chest x-ray, EKG, coronavirus testing.  Will give small fluid bolus, patient does not appear to be fluid overloaded or heart failure.  Vital signs reviewed and are as follows: BP (!) 125/98 (BP Location: Left Arm)   Pulse (!) 102   Temp 98 F (36.7 C) (Oral)   Resp 18   SpO2 95%   11:16 AM blood sugar is 812.  Normal bicarbonate, anion gap slightly elevated at 17.  VBG and beta hydroxybutyrate ordered.  Additional fluids ordered.  1:13 PM I spoke earlier with on-call heart transplant coordinator at [facility].  From a transplant status patient has been doing very well.  It sounds like his blood sugars are being managed by the heart transplant team.  Patient has not  seen an endocrinologist at [facility].  There are no absolute indications for transfer to [facility], however she states they are happy to take the patient if we are uncomfortable caring for him here.  I told him I would discuss this with the patient and our hospitalist team here.  Transplant coordinator was going to inform the on-call transplant MD regarding our conversation.  I discussed findings to this point with patient and wife who is now at bedside.  Patient is comfortable with either admission to the hospital here or transfer to [facility].  I discussed the case with Dr. Doristine Bosworth.  We have discussed all pertinent labs and patient exam.  She will see patient and evaluate.   BP (!) 109/92 (BP Location: Right Arm)   Pulse 85   Temp 98 F (36.7 C) (Oral)   Resp 16   Ht 5' 7.5" (1.715 m)   Wt 69.4 kg   SpO2 98%   BMI 23.61 kg/m    Clinical Course as of Apr 05 1311  Sat Apr 05, 2019  1225 Patient seen by myself as well as PA  provider. Briefly is a 54 year old male with a history of cardiac renal transplant December 2020 at [facility] presenting to emergency department with hyperglycemia and fatigue. Michela Pitcher he was told to come in to the ED because his outpatient testing had elevated blood sugar. He has no prior history of diabetes but began having difficulties with sugar control after his transplant. He had been on chronic prednisone 20 mg which was decreased to 15 mg about a week ago. This was because his blood sugar was over 500.  Today his sugar is >800.  He is on insulin 20 units TID and has bene taking it.  Clinically he is well appearing.  Labs notable for minor anion gap, no ketones in urine.  Will start on insulin ggt, give fluids, discuss with his transplant time.  Cr close to baseline today from 3/8 records, was 1.7 at the time.  No infecitous symptoms otherwise   [MT]    Clinical Course User Index [MT] Trifan, Carola Rhine, MD   MDM Rules/Calculators/A&P                      Admit.    Final Clinical Impression(s) / ED Diagnoses Final diagnoses:  Hyperglycemia without ketosis  History of heart transplant [facility])  History of kidney transplant    Rx / DC Orders ED Discharge Orders    None       Carlisle Cater, PA-C 04/05/19 1317    Wyvonnia Dusky, MD 04/05/19 1827

## 2019-04-06 DIAGNOSIS — E876 Hypokalemia: Secondary | ICD-10-CM

## 2019-04-06 DIAGNOSIS — N179 Acute kidney failure, unspecified: Secondary | ICD-10-CM

## 2019-04-06 DIAGNOSIS — D849 Immunodeficiency, unspecified: Secondary | ICD-10-CM

## 2019-04-06 LAB — BASIC METABOLIC PANEL
Anion gap: 13 (ref 5–15)
BUN: 18 mg/dL (ref 6–20)
CO2: 25 mmol/L (ref 22–32)
Calcium: 9 mg/dL (ref 8.9–10.3)
Chloride: 99 mmol/L (ref 98–111)
Creatinine, Ser: 1.37 mg/dL — ABNORMAL HIGH (ref 0.61–1.24)
GFR calc Af Amer: >60 mL/min (ref >60)
GFR calc non Af Amer: 58 mL/min — ABNORMAL LOW (ref >60)
Glucose, Bld: 153 mg/dL — ABNORMAL HIGH (ref 70–99)
Potassium: 3 mmol/L — ABNORMAL LOW (ref 3.5–5.1)
Sodium: 137 mmol/L (ref 135–145)

## 2019-04-06 LAB — GLUCOSE, CAPILLARY
Glucose-Capillary: 158 mg/dL — ABNORMAL HIGH (ref 70–99)
Glucose-Capillary: 169 mg/dL — ABNORMAL HIGH (ref 70–99)
Glucose-Capillary: 169 mg/dL — ABNORMAL HIGH (ref 70–99)
Glucose-Capillary: 177 mg/dL — ABNORMAL HIGH (ref 70–99)
Glucose-Capillary: 188 mg/dL — ABNORMAL HIGH (ref 70–99)
Glucose-Capillary: 254 mg/dL — ABNORMAL HIGH (ref 70–99)
Glucose-Capillary: 329 mg/dL — ABNORMAL HIGH (ref 70–99)
Glucose-Capillary: 337 mg/dL — ABNORMAL HIGH (ref 70–99)

## 2019-04-06 MED ORDER — TORSEMIDE 20 MG PO TABS: 10.0000 mg | ORAL_TABLET | Freq: Every day | ORAL | Status: DC | PRN

## 2019-04-06 MED ORDER — ENOXAPARIN SODIUM 40 MG/0.4ML ~~LOC~~ SOLN
40.0000 mg | SUBCUTANEOUS | Status: DC
  Administered 2019-04-06 – 2019-04-07 (×2): 40 mg via SUBCUTANEOUS
  Filled 2019-04-06 (×2): qty 0.4

## 2019-04-06 MED ORDER — POTASSIUM CHLORIDE CRYS ER 20 MEQ PO TBCR
40.0000 meq | EXTENDED_RELEASE_TABLET | Freq: Once | ORAL | Status: AC
  Administered 2019-04-06: 40 meq via ORAL
  Filled 2019-04-06: qty 2

## 2019-04-06 MED ORDER — ROSUVASTATIN CALCIUM 20 MG PO TABS
20.0000 mg | ORAL_TABLET | Freq: Every day | ORAL | Status: DC
  Administered 2019-04-06 – 2019-04-07 (×2): 20 mg via ORAL
  Filled 2019-04-06 (×2): qty 1

## 2019-04-06 MED ORDER — TACROLIMUS 1 MG PO CAPS
7.0000 mg | ORAL_CAPSULE | Freq: Two times a day (BID) | ORAL | Status: DC
  Administered 2019-04-06 – 2019-04-07 (×3): 7 mg via ORAL
  Filled 2019-04-06 (×4): qty 7

## 2019-04-06 MED ORDER — SODIUM CHLORIDE 0.9 % IV SOLN: INTRAVENOUS | Status: AC

## 2019-04-06 NOTE — Evaluation (Signed)
Physical Therapy Evaluation Patient Details Name: Marvin Martin MRN: [id number] DOB: 17-Oct-1965 Today's Date: 04/06/2019   History of Present Illness  Patient with history of heart transplant secondary to ischemic cardiomyopathy and deceased donor kidney transplant performed at [facility] in 12/2018, immunosuppressed on prednisone/tacrolimus/mycophenalate, history of diabetes, antiplatelet ticagralor -- presents to the emergency department today with elevated blood sugar.  Clinical Impression  Patient seen for mobility assessment. Mobilizing well. Independent with no further acute PT needs. Will sign off.    Follow Up Recommendations No PT follow up    Equipment Recommendations  None recommended by PT    Recommendations for Other Services       Precautions / Restrictions        Mobility  Bed Mobility Overal bed mobility: Independent                Transfers Overall transfer level: Independent                  Ambulation/Gait Ambulation/Gait assistance: Independent Gait Distance (Feet): 510 Feet Assistive device: None Gait Pattern/deviations: WFL(Within Functional Limits)     General Gait Details: no noted instability or difficulty with gait  Stairs            Wheelchair Mobility    Modified Rankin (Stroke Patients Only) Modified Rankin (Stroke Patients Only) Pre-Morbid Rankin Score: No symptoms Modified Rankin: No symptoms     Balance Overall balance assessment: Independent                               Standardized Balance Assessment Standardized Balance Assessment : Dynamic Gait Index   Dynamic Gait Index Level Surface: Normal Change in Gait Speed: Normal Gait with Horizontal Head Turns: Normal Gait with Vertical Head Turns: Normal Gait and Pivot Turn: Normal Step Over Obstacle: Normal Step Around Obstacles: Normal Steps: Mild Impairment Total Score: 23       Pertinent Vitals/Pain Pain Assessment: No/denies pain     Home Living Family/patient expects to be discharged to:: Private residence Living Arrangements: Children Available Help at Discharge: Family Type of Home: Apartment Home Access: Stairs to enter Entrance Stairs-Rails: Can reach both Entrance Stairs-Number of Steps: 3 Home Layout: Two level Home Equipment: Cane - single point;Walker - 2 wheels      Prior Function Level of Independence: Independent               Hand Dominance   Dominant Hand: Right    Extremity/Trunk Assessment   Upper Extremity Assessment Upper Extremity Assessment: Overall WFL for tasks assessed    Lower Extremity Assessment Lower Extremity Assessment: Overall WFL for tasks assessed       Communication   Communication: No difficulties  Cognition Arousal/Alertness: Awake/alert Behavior During Therapy: WFL for tasks assessed/performed Overall Cognitive Status: Within Functional Limits for tasks assessed                                        General Comments      Exercises     Assessment/Plan    PT Assessment Patent does not need any further PT services  PT Problem List         PT Treatment Interventions      PT Goals (Current goals can be found in the Care Plan section)  Acute Rehab PT Goals PT Goal Formulation: All assessment  and education complete, DC therapy    Frequency     Barriers to discharge        Co-evaluation               AM-PAC PT "6 Clicks" Mobility  Outcome Measure Help needed turning from your back to your side while in a flat bed without using bedrails?: None Help needed moving from lying on your back to sitting on the side of a flat bed without using bedrails?: None Help needed moving to and from a bed to a chair (including a wheelchair)?: None Help needed standing up from a chair using your arms (e.g., wheelchair or bedside chair)?: None Help needed to walk in hospital room?: None Help needed climbing 3-5 steps with a railing? :  None 6 Click Score: 24    End of Session Equipment Utilized During Treatment: Gait belt Activity Tolerance: Patient tolerated treatment well Patient left: in chair;with call bell/phone within reach Nurse Communication: Mobility status PT Visit Diagnosis: Other symptoms and signs involving the nervous system (R29.898)    Time: 0953-1010 PT Time Calculation (min) (ACUTE ONLY): 17 min   Charges:   PT Evaluation $PT Eval Low Complexity: [place], PT DPT  Board Certified Neurologic Specialist Acute Rehabilitation Services Office [place] 04/06/2019, 10:10 AM

## 2019-04-06 NOTE — Progress Notes (Signed)
PROGRESS NOTE  Marvin Martin PVX:[id number] DOB: 02-Feb-1965 DOA: 04/05/2019 PCP: Charlott Rakes, MD  Brief summary:  Marvin Martin is a 54 y.o. male with medical history significant of ischemic cardiomyopathy status post heart transplant and kidney transplant performed at [facility] in 12/2018-on chronic immunosuppression (prednisone/tacrolimus/mycophenolate)  presented to the hospital due to hyperglycemia ,DKA, was started on insulin drip  HPI/Recap of past 24 hours:  Off insulin drip, blood glucose improving, appears dehydrated  Assessment/Plan: Principal Problem:   Drug-induced hyperglycemia Active Problems:   CAD in native artery   Benign essential HTN   CKD (chronic kidney disease) stage 3, GFR 30-59 ml/min   Hyperlipidemia   Gout   Renal transplant, status post   H/O heart transplant ([place])  Severe hyperglycemia with DKA with elevated anion gap , with pseudohyponatremia on presentation -He is started on insulin drip, blood glucose improved, gap closed -Now transition to subcu insulin, continue to appear dehydrated, will continue hydration -He denies history of diabetes, no family history of blood diabetes either, he report was started on sliding scale insulin at home since he is started on prednisone for which his transplant doctor will plan to taper in the next few months.  He does not have a PCP in town. -A1c 14 -He would like to have diabetes education and diet education -He will need to discharge on long acting insulin at least for the foreseeable future while he is on prednisone.  AKI/CKD 2, recent history of heart and kidney transplant in December 2020. -UA no bacteria, he reported urinary frequency recently -BUN 25 creatinine 1.9 on presentation -BUN/creatinine improved with hydration, will continue for another 24 hours  Hypokalemia, k is 3,  replace K, repeat in a.m., check mag Keep on telemetry due to hypoglycemia  Hypertension -Stable on home medication  Norvasc and Coreg  Status post heart and renal transplant/immunosuppressed status:  -Stable -Continue home immunosuppression: Prednisone, tacrolimus, mycophenolate , alirocumab -Continue atovaquone and acyclovir prophylaxis  Home med rec on admission was not accurate, there are significant changes post transplant, home med rec list updated now  DVT Prophylaxis: Lovenox  Code Status: Full  Family Communication: patient   Disposition Plan:    Patient came from:                                 Home                                                                          Anticipated d/c place:  Home  Barriers to d/c OR conditions which need to be met to effect a safe d/c:  DC tomorrow if blood glucose stable on current regimen, after diabetes and diet education   Consultants:  Diabetes RN  Dietitian  Procedures:  Insulin drip  Antibiotics:  None   Objective: BP 111/86 (BP Location: Left Arm)   Pulse 89   Temp 97.8 F (36.6 C) (Oral)   Resp 17   Ht 5' 7.5" (1.715 m)   Wt 68.9 kg   SpO2 95%   BMI 23.46 kg/m   Intake/Output Summary (Last 24 hours) at 04/06/2019 1138 Last data filed at 04/05/2019 1244  Gross per 24 hour  Intake 500 ml  Output --  Net 500 ml   Filed Weights   04/05/19 1030 04/05/19 1854  Weight: 69.4 kg 68.9 kg    Exam: Patient is examined daily including today on 04/06/2019, exams remain the same as of yesterday except that has changed    General:  NAD  Cardiovascular: RRR  Respiratory: CTABL  Abdomen: Soft/ND/NT, positive BS  Musculoskeletal: No Edema  Neuro: alert, oriented   Data Reviewed: Basic Metabolic Panel: Recent Labs  Lab 04/05/19 0956 04/05/19 1159 04/05/19 1927 04/06/19 0539  NA 130* 128* 135 137  K 5.0 4.9 3.6 3.0*  CL 89*  --  99 99  CO2 24  --  23 25  GLUCOSE 812*  --  163* 153*  BUN 25*  --  19 18  CREATININE 1.99*  --  1.34* 1.37*  CALCIUM 9.6  --  9.1 9.0  MG  --   --  1.8  --   PHOS  --   --  3.4   --    Liver Function Tests: Recent Labs  Lab 04/05/19 0956 04/05/19 1927  AST 31 27  ALT 27 25  ALKPHOS 117 84  BILITOT 1.0 0.8  PROT 6.7 6.1*  ALBUMIN 3.7 3.4*   No results for input(s): LIPASE, AMYLASE in the last 168 hours. No results for input(s): AMMONIA in the last 168 hours. CBC: Recent Labs  Lab 04/05/19 0956 04/05/19 1159  WBC 7.4  --   HGB 15.0 15.6  HCT 43.8 46.0  MCV 92.0  --   PLT 209  --    Cardiac Enzymes:   No results for input(s): CKTOTAL, CKMB, CKMBINDEX, TROPONINI in the last 168 hours. BNP (last 3 results) No results for input(s): BNP in the last 8760 hours.  ProBNP (last 3 results) No results for input(s): PROBNP in the last 8760 hours.  CBG: Recent Labs  Lab 04/06/19 0008 04/06/19 0116 04/06/19 0215 04/06/19 0553 04/06/19 0811  GLUCAP 188* 177* 169* 158* 169*    Recent Results (from the past 240 hour(s))  SARS CORONAVIRUS 2 (TAT 6-24 HRS) Nasopharyngeal Nasopharyngeal Swab     Status: None   Collection Time: 04/05/19  1:40 PM   Specimen: Nasopharyngeal Swab  Result Value Ref Range Status   SARS Coronavirus 2 NEGATIVE NEGATIVE Final    Comment: (NOTE) SARS-CoV-2 target nucleic acids are NOT DETECTED. The SARS-CoV-2 RNA is generally detectable in upper and lower respiratory specimens during the acute phase of infection. Negative results do not preclude SARS-CoV-2 infection, do not rule out co-infections with other pathogens, and should not be used as the sole basis for treatment or other patient management decisions. Negative results must be combined with clinical observations, patient history, and epidemiological information. The expected result is Negative. Fact Sheet for Patients: SugarRoll.be Fact Sheet for Healthcare Providers: https://www.woods-mathews.com/ This test is not yet approved or cleared by the [place] FDA and  has been authorized for detection and/or diagnosis of  SARS-CoV-2 by FDA under an Emergency Use Authorization (EUA). This EUA will remain  in effect (meaning this test can be used) for the duration of the COVID-19 declaration under Section 56 4(b)(1) of the Act, 21 U.S.C. section 360bbb-3(b)(1), unless the authorization is terminated or revoked sooner. Performed at [facility],  [address] [place], [place] [address]      Studies: No results found.  Scheduled Meds: . amLODipine  10 mg Oral Daily  . aspirin  81 mg Oral Daily  . atovaquone  1,500 mg Oral Daily  . calcium-vitamin D  1 tablet Oral Q breakfast  . carvedilol  12.5 mg Oral BID  . cholecalciferol  1,000 Units Oral Daily  . doxazosin  2 mg Oral QHS  . ezetimibe  10 mg Oral Daily  . insulin aspart  0-9 Units Subcutaneous Q4H  . insulin glargine  15 Units Subcutaneous Q24H  . magnesium oxide  400 mg Oral BID  . mycophenolate  1,500 mg Oral BID  . nystatin  5 mL Oral QID  . pantoprazole  40 mg Oral Daily  . potassium chloride  40 mEq Oral Once  . predniSONE  15 mg Oral Daily  . rosuvastatin  20 mg Oral Daily  . tacrolimus  7 mg Oral BID  . valGANciclovir  450 mg Oral Daily    Continuous Infusions:   Time spent: 58mns I have personally reviewed and interpreted on  04/06/2019 daily labs, tele strips, imagings as discussed above under date review session and assessment and plans.  I reviewed all nursing notes, pharmacy notes,   vitals, pertinent old records  I have discussed plan of care as described above with RN , patient  on 04/06/2019   FFlorencia ReasonsMD, PhD, FACP  [facility]  Available via Epic secure chat 7am-7pm for nonurgent issues Please page for urgent issues, pager number available through [email or web address] .   04/06/2019, 11:38 AM  LOS: 1 day

## 2019-04-07 DIAGNOSIS — Z940 Kidney transplant status: Secondary | ICD-10-CM

## 2019-04-07 DIAGNOSIS — Z941 Heart transplant status: Secondary | ICD-10-CM

## 2019-04-07 DIAGNOSIS — R739 Hyperglycemia, unspecified: Secondary | ICD-10-CM

## 2019-04-07 DIAGNOSIS — T50905A Adverse effect of unspecified drugs, medicaments and biological substances, initial encounter: Secondary | ICD-10-CM

## 2019-04-07 DIAGNOSIS — N183 Chronic kidney disease, stage 3 unspecified: Secondary | ICD-10-CM

## 2019-04-07 DIAGNOSIS — E785 Hyperlipidemia, unspecified: Secondary | ICD-10-CM

## 2019-04-07 DIAGNOSIS — I10 Essential (primary) hypertension: Secondary | ICD-10-CM

## 2019-04-07 DIAGNOSIS — I2510 Atherosclerotic heart disease of native coronary artery without angina pectoris: Secondary | ICD-10-CM

## 2019-04-07 DIAGNOSIS — M1A00X0 Idiopathic chronic gout, unspecified site, without tophus (tophi): Secondary | ICD-10-CM

## 2019-04-07 LAB — CBC WITH DIFFERENTIAL/PLATELET
Abs Immature Granulocytes: 0.04 10*3/uL (ref 0.00–0.07)
Basophils Absolute: 0 10*3/uL (ref 0.0–0.1)
Basophils Relative: 0 %
Eosinophils Absolute: 0 10*3/uL (ref 0.0–0.5)
Eosinophils Relative: 0 %
HCT: 39.6 % (ref 39.0–52.0)
Hemoglobin: 13.5 g/dL (ref 13.0–17.0)
Immature Granulocytes: 0 %
Lymphocytes Relative: 10 %
Lymphs Abs: 0.9 10*3/uL (ref 0.7–4.0)
MCH: 31.4 pg (ref 26.0–34.0)
MCHC: 34.1 g/dL (ref 30.0–36.0)
MCV: 92.1 fL (ref 80.0–100.0)
Monocytes Absolute: 0.6 10*3/uL (ref 0.1–1.0)
Monocytes Relative: 7 %
Neutro Abs: 7.8 10*3/uL — ABNORMAL HIGH (ref 1.7–7.7)
Neutrophils Relative %: 83 %
Platelets: 176 10*3/uL (ref 150–400)
RBC: 4.3 MIL/uL (ref 4.22–5.81)
RDW: 12.8 % (ref 11.5–15.5)
WBC: 9.3 10*3/uL (ref 4.0–10.5)
nRBC: 0 % (ref 0.0–0.2)

## 2019-04-07 LAB — BASIC METABOLIC PANEL
Anion gap: 11 (ref 5–15)
BUN: 17 mg/dL (ref 6–20)
CO2: 24 mmol/L (ref 22–32)
Calcium: 9 mg/dL (ref 8.9–10.3)
Chloride: 101 mmol/L (ref 98–111)
Creatinine, Ser: 1.17 mg/dL (ref 0.61–1.24)
GFR calc Af Amer: >60 mL/min (ref >60)
GFR calc non Af Amer: >60 mL/min (ref >60)
Glucose, Bld: 218 mg/dL — ABNORMAL HIGH (ref 70–99)
Potassium: 4.2 mmol/L (ref 3.5–5.1)
Sodium: 136 mmol/L (ref 135–145)

## 2019-04-07 LAB — GLUCOSE, CAPILLARY
Glucose-Capillary: 149 mg/dL — ABNORMAL HIGH (ref 70–99)
Glucose-Capillary: 153 mg/dL — ABNORMAL HIGH (ref 70–99)
Glucose-Capillary: 217 mg/dL — ABNORMAL HIGH (ref 70–99)
Glucose-Capillary: 248 mg/dL — ABNORMAL HIGH (ref 70–99)
Glucose-Capillary: 250 mg/dL — ABNORMAL HIGH (ref 70–99)

## 2019-04-07 LAB — MAGNESIUM: Magnesium: 1.5 mg/dL — ABNORMAL LOW (ref 1.7–2.4)

## 2019-04-07 MED ORDER — INSULIN ASPART 100 UNIT/ML ~~LOC~~ SOLN
2.0000 [IU] | Freq: Three times a day (TID) | SUBCUTANEOUS | Status: DC
  Administered 2019-04-07: 12:00:00 2 [IU] via SUBCUTANEOUS

## 2019-04-07 MED ORDER — NOVOLOG FLEXPEN 100 UNIT/ML ~~LOC~~ SOPN: 3.0000 [IU] | PEN_INJECTOR | Freq: Three times a day (TID) | SUBCUTANEOUS | 11 refills | Status: DC

## 2019-04-07 MED ORDER — INSULIN STARTER KIT- PEN NEEDLES (ENGLISH)
1.0000 | Freq: Once | Status: AC
  Administered 2019-04-07: 1
  Filled 2019-04-07: qty 1

## 2019-04-07 MED ORDER — INSULIN PEN NEEDLE 32G X 4 MM MISC: 1.0000 "application " | Freq: Four times a day (QID) | 5 refills | Status: DC

## 2019-04-07 MED ORDER — LANTUS SOLOSTAR 100 UNIT/ML ~~LOC~~ SOPN: 15.0000 [IU] | PEN_INJECTOR | Freq: Every day | SUBCUTANEOUS | 11 refills | Status: DC

## 2019-04-07 MED ORDER — INSULIN STARTER KIT- PEN NEEDLES (ENGLISH): 1.0000 | Freq: Once | 0 refills | Status: AC

## 2019-04-07 MED ORDER — LIVING WELL WITH DIABETES BOOK
Freq: Once | Status: AC
  Filled 2019-04-07: qty 1

## 2019-04-07 MED ORDER — MAGNESIUM SULFATE IN D5W 1-5 GM/100ML-% IV SOLN
1.0000 g | Freq: Once | INTRAVENOUS | Status: AC
  Administered 2019-04-07: 1 g via INTRAVENOUS
  Filled 2019-04-07: qty 100

## 2019-04-07 NOTE — TOC Transition Note (Signed)
Transition of Care [facility]) - CM/SW Discharge Note   Patient Details  Name: Marvin Martin MRN: [id number] Date of Birth: Dec 23, 1965  Transition of Care [facility] CM/SW Contact:  Pollie Friar, RN Phone Number: 04/07/2019, 2:20 PM   Clinical Narrative:    Pt discharging home with self care. No f/u per PT and no DME needs.  PCP: pt active with [facility] in 2019 and asked to continue with them. CM obtained appt and placed it on the AVS. Pt has supervision at home and transportation to home.   Final next level of care: Home/Self Care Barriers to Discharge: No Barriers Identified   Patient Goals and CMS Choice        Discharge Placement                       Discharge Plan and Services                                     Social Determinants of Health (SDOH) Interventions     Readmission Risk Interventions No flowsheet data found.

## 2019-04-07 NOTE — Progress Notes (Addendum)
Inpatient Diabetes Program Recommendations  AACE/ADA: New Consensus Statement on Inpatient Glycemic Control (2015)  Target Ranges:  Prepandial:   less than 140 mg/dL      Peak postprandial:   less than 180 mg/dL (1-2 hours)      Critically ill patients:  140 - 180 mg/dL   Lab Results  Component Value Date   GLUCAP 153 (H) 04/07/2019   HGBA1C 14.0 (H) 04/05/2019    Review of Glycemic Control Results for Marvin Martin, Marvin Martin (MRN [id number]) as of 04/07/2019 11:00  Ref. Range 04/06/2019 16:54 04/06/2019 19:50 04/07/2019 00:02 04/07/2019 03:14 04/07/2019 07:34  Glucose-Capillary Latest Ref Range: 70 - 99 mg/dL 337 (H) 329 (H) 248 (H) 217 (H) 153 (H)   Diabetes history: Recent New Onset on steroids Outpatient Diabetes medications: Regular Novolin insulin 10 units tid  (15 units if CBG >200) Current orders for Inpatient glycemic control: Lantus 15 units + Novolog meal coverage 2 units tid if eats 50% + Novolog sensitive correction q 4 hrs.  Inpatient Diabetes Program Recommendations:   Since patient is now eating, -Increase Novolog meal coverage to 3 units tid if eats 50% -Change timing of Novolog correction to tid + hs 0-5 units Ordered Living Well With Diabetes Book and insulin pen starter kit. Will plan to speak with patient.  2:15 pm Spoke with patient @ bedside. Educated patient on insulin pen use at home. Reviewed contents of insulin flexpen starter kit. Reviewed all steps of insulin pen including attachment of needle, 2-unit air shot, dialing up dose, giving injection, removing needle, disposal of sharps, storage of unused insulin, disposal of insulin etc. Patient able to provide successful return demonstration. Also reviewed troubleshooting with insulin pen. MD to give patient Rxs for insulin pens and insulin pen needles. Ordered insulin pen starter kit from pharmacy. Reviewed hypoglycemia along with difference in long acting and short acting insulin. Patient plans to read the book Living  Well With Diabetes.  Thank you, Nani Gasser. Kaysa Roulhac, RN, MSN, CDE  Diabetes Coordinator Inpatient Glycemic Control Team Team Pager [phone] (8am-5pm) 04/07/2019 11:03 AM

## 2019-04-07 NOTE — Discharge Summary (Signed)
Physician Discharge Summary  Marvin Martin KLK:[id number] DOB: [birth year] DOA: 04/05/2019  PCP: Charlott Rakes, MD  Admit date: 04/05/2019 Discharge date: 04/07/2019 Consultations: Diabetes educator Admitted From: home Disposition: home  Discharge Diagnoses:  Principal Problem:   Drug-induced hyperglycemia Active Problems:   CAD in native artery   Benign essential HTN   CKD (chronic kidney disease) stage 3, GFR 30-59 ml/min   Hyperlipidemia   Gout   Renal transplant, status post   H/O heart transplant [facility])   Hospital Course Summary: 54 y.o.malewith medical history significant ofischemic cardiomyopathy status post heart transplant and kidney transplant performed at [facility] in 12/2018-on chronic immunosuppression(prednisone/tacrolimus/mycophenolate)diabetes mellitus-on insulin,HTN, hyperlipidemia, CKD stage III, GERD, chronic constipation presents to emergency department in concern for hyperglycemia.had regular blood work-up and his blood sugar was found to be more than 600 , directed by PCP the following day to present to the ED. Patient reports compliance with medication regimen and follow-up appointments. He takes 10 units of regular insulin 3 times daily,  15 units  if and when blood sugar is more than 200.  ED Course: Afebrile. BP elevated 140/111. Blood sugar in 800s, UA negative, COVID-19 negative, chest x-ray negative,CMP with stable creat at 1.9/CKD, sodium: 130, chloride: 89, anion gap: 17. Patient started on insulin drip and received IV fluids in the ED.  Hospital course: Patient admitted to [facility] for hyperglycemia/DKA management.  Assessment/Plan:  Diabetic ketoacidosis: POA with severe hyperglycemia, metabolic acidosis, elevated BHBA and  pseudohyponatremia on presentation. Patient started on insulin drip,blood glucose improved,gap closed. Now insulin drip transitioned to subcu insulin,continued on IV hydration due to dry appearance.He denies history of diabetes,no  family history of blood diabetes either,he reportedly was started on sliding scale insulin at home since he was started on prednisone, his transplant doctor had planned to taper prednisone in the next few months.He does not have a PCP in town.HgbA1c 14. He expressed interest in diabetes and diet education. He will be discharged on long acting insulin at least for the foreseeable future while he is on prednisone. He was seen by diabetes educator today and planned for discharge on 15 units Lantus qhs + premeal aspart insulin 3 units before meals as well as SSI QID coverage (patient familiar with sliding scale use). He will be issued insulin pen starter kit and pen needles upon discharge. He has insurance and advised PCP set up /follow up closely for further adjustments based on daily BG log at home. His insulin regimen will need to be titrated based on prednisone taper eventually. He was couseled regarding diabetic diet adherence by Diabetes educator prior to discharge.  AKI/CKD 2,recent history of heart and kidney transplant in December 2020.UA no bacteria,he reported urinary frequency recently. BUN/creatinine 25/1.9 on presentation.    BUN/creatinine progressively improved with hydration to 17/1.17 today. Can d/c IVF . His home med list includes Torsemide prn for leg swellings but looks euvolemic currently.  Pseudohyponatremia: POA in the setting of severe hyperglycemia. Now improved.   Hypokalemia: Replaced with prn supplementation. Low mag at 1.5, replace po/IV  Hypertension-Stable on home medication Norvasc and Coreg  Status post heart and renal transplant/immunosuppressed status:Continue home immunosuppression: Prednisone, tacrolimus, mycophenolate,alirocumab. Continue prophylactic atovaquone and acyclovir.     Discharge Exam:  Vitals:   04/07/19 1001 04/07/19 1151  BP: 106/78 109/84  Pulse: 70 69  Resp:  16  Temp:  97.7 F (36.5 C)  SpO2:  100%   Vitals:   04/07/19 0316  04/07/19 0801 04/07/19 1001 04/07/19 1151  BP: [name])  118/92 (!) 132/93 106/78 109/84  Pulse: 74 71 70 69  Resp: 16 16  16   Temp: (!) 97.5 F (36.4 C) 97.8 F (36.6 C)  97.7 F (36.5 C)  TempSrc: Oral Oral  Oral  SpO2: 98% 100%  100%  Weight:      Height:        General: Pt is alert, awake, not in acute distress Cardiovascular: RRR, S1/S2 +, no rubs, no gallops Respiratory: CTA bilaterally, no wheezing, no rhonchi Abdominal: Soft, NT, ND, bowel sounds + Extremities: no edema, no cyanosis  Discharge Condition:Stable CODE STATUS: Diet recommendation: Recommendations for Outpatient Follow-up:  1. Follow up with PCP: 5-7 days 2. Follow up with consultants: Primary cardiologist/nephrologist/transoplant center as scheduled 3. Please obtain follow up labs including: Hgb A1C in 3 months and BG log review on PCP F/U visit.  Home Health services upon discharge: no Equipment/Devices upon discharge:none   Discharge Instructions:  Discharge Instructions    (HEART FAILURE PATIENTS) Call MD:  Anytime you have any of the following symptoms: 1) 3 pound weight gain in 24 hours or 5 pounds in 1 week 2) shortness of breath, with or without a dry hacking cough 3) swelling in the hands, feet or stomach 4) if you have to sleep on extra pillows at night in order to breathe.   Complete by: As directed    Call MD for:  difficulty breathing, headache or visual disturbances   Complete by: As directed    Call MD for:  extreme fatigue   Complete by: As directed    Call MD for:  persistant dizziness or light-headedness   Complete by: As directed    Call MD for:  persistant nausea and vomiting   Complete by: As directed    Call MD for:  severe uncontrolled pain   Complete by: As directed    Call MD for:  temperature >100.4   Complete by: As directed    Diet - low sodium heart healthy   Complete by: As directed    Increase activity slowly   Complete by: As directed      Allergies as of 04/07/2019    No Known Allergies     Medication List    STOP taking these medications   insulin regular 100 units/mL injection Commonly known as: NOVOLIN R   predniSONE 5 MG tablet Commonly known as: DELTASONE     TAKE these medications   amLODipine 10 MG tablet Commonly known as: NORVASC TAKE 1 TABLET BY MOUTH EVERY DAY   aspirin 81 MG tablet Take 81 mg by mouth daily.   atovaquone 750 MG/5ML suspension Commonly known as: MEPRON Take 10 mLs by mouth daily.   Calcium Carbonate-Vitamin D 600-400 MG-UNIT tablet Take 1 tablet by mouth daily.   carvedilol 12.5 MG tablet Commonly known as: COREG Take 12.5 mg by mouth 2 (two) times daily.   cholecalciferol 1000 units tablet Commonly known as: VITAMIN D Take 1,000 Units by mouth daily.   doxazosin 2 MG tablet Commonly known as: CARDURA Take 2 mg by mouth at bedtime.   ezetimibe 10 MG tablet Commonly known as: ZETIA TAKE 1 TABLET BY MOUTH EVERY DAY   HYDROcodone-acetaminophen 5-325 MG tablet Commonly known as: NORCO/VICODIN Take 1 tablet by mouth every 4 (four) hours as needed.   Insulin Pen Needle 32G X 4 MM Misc 1 application by Does not apply route 4 (four) times daily.   insulin starter kit- pen needles Misc 1 kit by Other route  once for 1 dose.   Lantus SoloStar 100 UNIT/ML Solostar Pen Generic drug: insulin glargine Inject 15 Units into the skin daily.   magnesium oxide 400 (241.3 Mg) MG tablet Commonly known as: MAG-OX Take 1 tablet by mouth 2 (two) times daily.   mycophenolate 500 MG tablet Commonly known as: CELLCEPT Take 1,500 mg by mouth in the morning and at bedtime.   nitroGLYCERIN 0.4 MG SL tablet Commonly known as: NITROSTAT Place 1 tablet (0.4 mg total) under the tongue every 5 (five) minutes as needed for chest pain. Reported on 04/12/2015   NovoLOG FlexPen 100 UNIT/ML FlexPen Generic drug: insulin aspart Inject 3 Units into the skin 3 (three) times daily with meals. And per sliding scale QID    nystatin 100000 UNIT/ML suspension Commonly known as: MYCOSTATIN Take 5 mLs by mouth 4 (four) times daily.   omega-3 acid ethyl esters 1 g capsule Commonly known as: LOVAZA Take 1 g by mouth daily.   ondansetron 4 MG tablet Commonly known as: ZOFRAN Take 1 tablet (4 mg total) every 8 (eight) hours as needed by mouth for nausea or vomiting. Reported on 05/24/2015   oxyCODONE 5 MG immediate release tablet Commonly known as: Oxy IR/ROXICODONE Take 5 mg by mouth 4 (four) times daily as needed.   pantoprazole 40 MG tablet Commonly known as: PROTONIX Take 1 tablet by mouth daily.   Praluent 75 MG/ML Soaj Generic drug: Alirocumab Inject 75 mg into the skin every 14 (fourteen) days.   rosuvastatin 40 MG tablet Commonly known as: CRESTOR TAKE 1 TABLET BY MOUTH EVERY DAY What changed:   how much to take  how to take this  when to take this   senna-docusate 8.6-50 MG tablet Commonly known as: Senokot-S Take 2 tablets by mouth daily as needed.   tacrolimus 1 MG capsule Commonly known as: PROGRAF Take 7 mg by mouth in the morning and at bedtime.   torsemide 10 MG tablet Commonly known as: DEMADEX Take 10 mg by mouth daily as needed.   valGANciclovir 450 MG tablet Commonly known as: VALCYTE Take 450 mg by mouth daily.       No Known Allergies    The results of significant diagnostics from this hospitalization (including imaging, microbiology, ancillary and laboratory) are listed below for reference.    Labs: BNP (last 3 results) No results for input(s): BNP in the last 8760 hours. Basic Metabolic Panel: Recent Labs  Lab 04/05/19 0956 04/05/19 1159 04/05/19 1927 04/06/19 0539 04/07/19 0452  NA 130* 128* 135 137 136  K 5.0 4.9 3.6 3.0* 4.2  CL 89*  --  99 99 101  CO2 24  --  23 25 24   GLUCOSE 812*  --  163* 153* 218*  BUN 25*  --  19 18 17   CREATININE 1.99*  --  1.34* 1.37* 1.17  CALCIUM 9.6  --  9.1 9.0 9.0  MG  --   --  1.8  --  1.5*  PHOS  --   --   3.4  --   --    Liver Function Tests: Recent Labs  Lab 04/05/19 0956 04/05/19 1927  AST 31 27  ALT 27 25  ALKPHOS 117 84  BILITOT 1.0 0.8  PROT 6.7 6.1*  ALBUMIN 3.7 3.4*   No results for input(s): LIPASE, AMYLASE in the last 168 hours. No results for input(s): AMMONIA in the last 168 hours. CBC: Recent Labs  Lab 04/05/19 0956 04/05/19 1159 04/07/19 0452  WBC 7.4  --  9.3  NEUTROABS  --   --  7.8*  HGB 15.0 15.6 13.5  HCT 43.8 46.0 39.6  MCV 92.0  --  92.1  PLT 209  --  176   Cardiac Enzymes: No results for input(s): CKTOTAL, CKMB, CKMBINDEX, TROPONINI in the last 168 hours. BNP: Invalid input(s): POCBNP CBG: Recent Labs  Lab 04/06/19 1950 04/07/19 0002 04/07/19 0314 04/07/19 0734 04/07/19 1150  GLUCAP 329* 248* 217* 153* 250*   D-Dimer No results for input(s): DDIMER in the last 72 hours. Hgb A1c Recent Labs    04/05/19 1927  HGBA1C 14.0*   Lipid Profile No results for input(s): CHOL, HDL, LDLCALC, TRIG, CHOLHDL, LDLDIRECT in the last 72 hours. Thyroid function studies No results for input(s): TSH, T4TOTAL, T3FREE, THYROIDAB in the last 72 hours.  Invalid input(s): FREET3 Anemia work up No results for input(s): VITAMINB12, FOLATE, FERRITIN, TIBC, IRON, RETICCTPCT in the last 72 hours. Urinalysis    Component Value Date/Time   COLORURINE STRAW (A) 04/05/2019 1136   APPEARANCEUR CLEAR 04/05/2019 1136   LABSPEC 1.025 04/05/2019 1136   PHURINE 5.0 04/05/2019 1136   GLUCOSEU >=500 (A) 04/05/2019 1136   HGBUR NEGATIVE 04/05/2019 1136   BILIRUBINUR NEGATIVE 04/05/2019 1136   KETONESUR 5 (A) 04/05/2019 1136   PROTEINUR NEGATIVE 04/05/2019 1136   NITRITE NEGATIVE 04/05/2019 1136   LEUKOCYTESUR NEGATIVE 04/05/2019 1136   Sepsis Labs Invalid input(s): PROCALCITONIN,  WBC,  LACTICIDVEN Microbiology Recent Results (from the past 240 hour(s))  SARS CORONAVIRUS 2 (TAT 6-24 HRS) Nasopharyngeal Nasopharyngeal Swab     Status: None   Collection Time:  04/05/19  1:40 PM   Specimen: Nasopharyngeal Swab  Result Value Ref Range Status   SARS Coronavirus 2 NEGATIVE NEGATIVE Final    Comment: (NOTE) SARS-CoV-2 target nucleic acids are NOT DETECTED. The SARS-CoV-2 RNA is generally detectable in upper and lower respiratory specimens during the acute phase of infection. Negative results do not preclude SARS-CoV-2 infection, do not rule out co-infections with other pathogens, and should not be used as the sole basis for treatment or other patient management decisions. Negative results must be combined with clinical observations, patient history, and epidemiological information. The expected result is Negative. Fact Sheet for Patients: SugarRoll.be Fact Sheet for Healthcare Providers: https://www.woods-mathews.com/ This test is not yet approved or cleared by the [place] FDA and  has been authorized for detection and/or diagnosis of SARS-CoV-2 by FDA under an Emergency Use Authorization (EUA). This EUA will remain  in effect (meaning this test can be used) for the duration of the COVID-19 declaration under Section 56 4(b)(1) of the Act, 21 U.S.C. section 360bbb-3(b)(1), unless the authorization is terminated or revoked sooner. Performed at [facility], [place] [address], [place], [place] [address]     Procedures/Studies: DG Chest 2 View  Result Date: 04/05/2019 CLINICAL DATA:  Fatigue. EXAM: CHEST - 2 VIEW COMPARISON:  June 21, 2015. FINDINGS: The heart size and mediastinal contours are within normal limits. Both lungs are clear. No pneumothorax or pleural effusion is noted. Sternotomy wires are noted. The visualized skeletal structures are unremarkable. IMPRESSION: No active cardiopulmonary disease. Electronically Signed   By: Marijo Conception M.D.   On: 04/05/2019 11:37     Time coordinating discharge: Over 30 minutes  SIGNED:   Guilford Shi, MD  [facility] 04/07/2019,  1:46 PM

## 2019-04-07 NOTE — Progress Notes (Signed)
  RD consulted for nutrition education.   Lab Results  Component Value Date   HGBA1C 14.0 (H) 04/05/2019    RD provided "Carbohydrate Counting for People with Diabetes" handout from the Academy of Nutrition and Dietetics. Discussed different food groups and their effects on blood sugar, emphasizing carbohydrate-containing foods. Provided list of carbohydrates and recommended serving sizes of common foods.  Discussed importance of controlled and consistent carbohydrate intake throughout the day. Provided examples of ways to balance meals/snacks and encouraged intake of high-fiber, whole grain complex carbohydrates. Teach back method used.  Pt reports taking predisone since his heart/kidney transplant in December. Suspect A1C number is a result of this. Discussed importance of taking medication, eating a balanced meals with consistent carbohydrates, and how to make appropriate beverage substitutions.    Expect great compliance.  Current diet order is heart healthy/carb modified Labs and medications reviewed. No further nutrition interventions warranted at this time. RD contact information provided. If additional nutrition issues arise, please re-consult RD.  Mariana Single RD, LDN Clinical Nutrition Pager listed in [place]

## 2019-04-08 ENCOUNTER — Telehealth: Payer: Self-pay

## 2019-04-08 NOTE — Telephone Encounter (Signed)
Opened in error

## 2019-04-08 NOTE — Telephone Encounter (Signed)
Transition Care Management Follow-up Telephone Call  Date of discharge and from where: 04/07/2019, [facility]   How have you been since you were released from the hospital? He said he feels fine.   Any questions or concerns? no questions/concerns at this time.    He said that he called DSS and has been approved for medicaid and should be receiving his medicaid card in the next week to ten days.   Items Reviewed:  Did the pt receive and understand the discharge instructions provided? he said that he has the instructions and has no questions.   Medications obtained and verified? he stated that  he has all medications, no questions about them and he said he is taking them as ordered and is familiar with the novolog sliding scale.   Any new allergies since your discharge?  none reported  Do you have support at home?  he said that he has his son to help him   Other (ie: DME, Home Health, etc) has glucometer and has been checking his blood sugar three times daily.   Functional Questionnaire: (I = Independent and D = Dependent) ADL's: independent, has cane a walker to use if needed.   Follow up appointments reviewed:    PCP Hospital f/u appt confirmed?.has televisit with Dr Margarita Rana tomorrow - 04/09/2019 @ 1050.  He said that he needs to establish care in this area. He has been following up with providers at [facility] f/u appt confirmed? .he said that he has an appointment to follow up at [facility] but he didn't have the information with him at the time of this call  Are transportation arrangements needed? no, he said he has transportation. He has his own vehicle and has someone that drives him.   If their condition worsens, is the pt aware to call  their PCP or go to the ED?yes  Was the patient provided with contact information for the PCP's office or ED?  he has the clinic phone number.   Was the pt encouraged to call back with questions or concerns?  yes

## 2019-04-09 ENCOUNTER — Other Ambulatory Visit: Payer: Self-pay

## 2019-04-09 ENCOUNTER — Ambulatory Visit: Payer: Medicare Other | Attending: Family Medicine | Admitting: Family Medicine

## 2019-04-10 ENCOUNTER — Other Ambulatory Visit: Payer: Self-pay | Admitting: *Deleted

## 2019-04-10 ENCOUNTER — Encounter: Payer: Self-pay | Admitting: *Deleted

## 2019-04-10 NOTE — Patient Outreach (Signed)
[facility]) Care Management  04/10/2019  Marvin Martin [birth year] [id number]   EMMI-GENERAL DISCHARGE-UNSUCCESSFUL RED ON EMMI ALERT Day #1 Date: 04/09/2019 Red Alert Reason: No follow up appointment  Outreach #1:  RN attempted to reach pt today however unsuccessful. RN able to leave a HIPAA approved voice message to the pt's mobile number.   Plan: Will rescheduled another follow up call over the next week for possible services.  Raina Mina, RN Care Management Coordinator [place] Office [phone]

## 2019-04-14 ENCOUNTER — Other Ambulatory Visit: Payer: Self-pay | Admitting: *Deleted

## 2019-04-14 NOTE — Patient Outreach (Signed)
[facility]) Care Management  04/14/2019  Marvin Martin 19-Jul-1965 [id number]    EMMI-GENERAL DISCHARGE-UNSUCCESSFUL RED ON Fults Day # 1 Date: 04/11/2019 Red Alert Reason: No follow up appointment.  Outreach #1: 2nd outreach call however unsuccessful. RN able to leave a HIPAA approved voice message requesting a call back.  Plan: Will follow up once again with another outreach call over the next week.  Raina Mina, RN Care Management Coordinator [place] Office [phone]

## 2019-04-21 ENCOUNTER — Other Ambulatory Visit: Payer: Self-pay | Admitting: *Deleted

## 2019-04-21 NOTE — Patient Outreach (Signed)
[place] [facility]) Care Management  04/21/2019  Marvin Martin 05-13-1965 [id number]    EMMI-GENERAL DISCHARGE-UNSUCCESSFUL RED ON EMMI ALERT Day #1 Date:04/09/2019 Red Alert Reason: No follow up appointment   Outreach # 3  Unsuccessful outreach call to pt today however history via Epic indicates provider's office has spoken with pt on 04/08/2019 and pt was doing "fine". Also indicates pt has been approved for MCD and awaiting his insurance card.   Plan: No response to the outreach calls or letter sent to this pt. Will close this case later this week if no response.  Raina Mina, RN Care Management Coordinator [place] Office [phone]

## 2019-04-25 ENCOUNTER — Other Ambulatory Visit: Payer: Self-pay | Admitting: *Deleted

## 2019-04-25 NOTE — Patient Outreach (Signed)
[place] [facility]) Care Management  04/25/2019  Marvin Martin 20-Mar-1965 [id number]   Unsuccessful contact x 3 for the [place] EMMI on 04/09/2019 Day #1 indicating no follow up appointments.   Case will be closed with no response to outreach message left to contact numbers and no response from the outreach letter sent in March. Provider's office updated and aware of case closure.   Raina Mina, RN Care Management Coordinator [name] Office [phone]

## 2019-04-29 DIAGNOSIS — N189 Chronic kidney disease, unspecified: Secondary | ICD-10-CM | POA: Diagnosis not present

## 2019-04-29 DIAGNOSIS — E1122 Type 2 diabetes mellitus with diabetic chronic kidney disease: Secondary | ICD-10-CM | POA: Diagnosis not present

## 2019-04-29 DIAGNOSIS — Z4821 Encounter for aftercare following heart transplant: Secondary | ICD-10-CM | POA: Diagnosis not present

## 2019-04-29 DIAGNOSIS — F418 Other specified anxiety disorders: Secondary | ICD-10-CM | POA: Diagnosis not present

## 2019-04-29 DIAGNOSIS — Z941 Heart transplant status: Secondary | ICD-10-CM | POA: Diagnosis not present

## 2019-04-29 DIAGNOSIS — Z940 Kidney transplant status: Secondary | ICD-10-CM | POA: Diagnosis not present

## 2019-04-29 DIAGNOSIS — R739 Hyperglycemia, unspecified: Secondary | ICD-10-CM | POA: Diagnosis not present

## 2019-04-29 DIAGNOSIS — E785 Hyperlipidemia, unspecified: Secondary | ICD-10-CM | POA: Diagnosis not present

## 2019-04-29 DIAGNOSIS — E1165 Type 2 diabetes mellitus with hyperglycemia: Secondary | ICD-10-CM | POA: Diagnosis not present

## 2019-04-29 DIAGNOSIS — Z79899 Other long term (current) drug therapy: Secondary | ICD-10-CM | POA: Diagnosis not present

## 2019-04-29 DIAGNOSIS — Z7952 Long term (current) use of systemic steroids: Secondary | ICD-10-CM | POA: Diagnosis not present

## 2019-04-29 DIAGNOSIS — Z7982 Long term (current) use of aspirin: Secondary | ICD-10-CM | POA: Diagnosis not present

## 2019-04-29 DIAGNOSIS — D849 Immunodeficiency, unspecified: Secondary | ICD-10-CM | POA: Diagnosis not present

## 2019-04-29 DIAGNOSIS — Z48298 Encounter for aftercare following other organ transplant: Secondary | ICD-10-CM | POA: Diagnosis not present

## 2019-04-29 DIAGNOSIS — H538 Other visual disturbances: Secondary | ICD-10-CM | POA: Diagnosis not present

## 2019-04-29 DIAGNOSIS — Z794 Long term (current) use of insulin: Secondary | ICD-10-CM | POA: Diagnosis not present

## 2019-04-29 DIAGNOSIS — T380X5A Adverse effect of glucocorticoids and synthetic analogues, initial encounter: Secondary | ICD-10-CM | POA: Diagnosis not present

## 2019-04-29 DIAGNOSIS — I1310 Hypertensive heart and chronic kidney disease without heart failure, with stage 1 through stage 4 chronic kidney disease, or unspecified chronic kidney disease: Secondary | ICD-10-CM | POA: Diagnosis not present

## 2019-04-29 DIAGNOSIS — Z298 Encounter for other specified prophylactic measures: Secondary | ICD-10-CM | POA: Diagnosis not present

## 2019-04-30 ENCOUNTER — Encounter (HOSPITAL_COMMUNITY): Payer: Self-pay | Admitting: *Deleted

## 2019-04-30 NOTE — Progress Notes (Signed)
Received second referral from Dr. Posey Pronto Transplant MD at [place] for this pt to participate in cardiac rehab with the diagnosis of Heart and Kidney Transplant in December. Previous referral pt did not respond to phone calls or letter mailed to his home address therefore his referral ws closed in January. Reviewed accompanying documents and notes in [place] and [facility] EMR.  Pt with recent admission in March for hyperglycemia/DKA.  Pt had labwork completed which showed glucose level to be more than 600. Pt was advised to seek medical attention immediately. Pt with no prior history of diabetes but is on chronic immunosuppression therapy.  Blood glucose responded well to insulin. Pt to follow up with his PCP.  Pt has not completed [facility].  Pt seen in the transplant office at [place]. Urgent  Referral for endocrinology made on 4/13.  Pt will need to complete either follow up with his PCP or endocrinology tor establishing care.  Pt will need to demonstrate blood glucose levels less than 300 in order to proceed with scheduling. MD referral form and request for recent 12 lead ekg sent to D. [facility] office.   Called and left message for pt requesting call back to discuss cardiac rehab.  Contact information provided Cherre Huger, BSN Cardiac and Pulmonary Rehab Nurse Navigator

## 2019-05-01 ENCOUNTER — Other Ambulatory Visit (HOSPITAL_COMMUNITY): Payer: Self-pay | Admitting: Internal Medicine

## 2019-05-01 ENCOUNTER — Telehealth (HOSPITAL_COMMUNITY): Payer: Self-pay

## 2019-05-01 NOTE — Telephone Encounter (Signed)
Pt insurance is active and benefits verified through Medicare A/B. Co-pay $0.00, DED $203.00/$203.00 met, out of pocket $0.00/$0.00 met, co-insurance 20%. No pre-authorization required. Passport, 05/01/19 @ 10:25AM, [phone]

## 2019-05-05 DIAGNOSIS — Z298 Encounter for other specified prophylactic measures: Secondary | ICD-10-CM | POA: Diagnosis not present

## 2019-05-05 DIAGNOSIS — Z940 Kidney transplant status: Secondary | ICD-10-CM | POA: Diagnosis not present

## 2019-05-05 DIAGNOSIS — D849 Immunodeficiency, unspecified: Secondary | ICD-10-CM | POA: Diagnosis not present

## 2019-05-05 DIAGNOSIS — Z48298 Encounter for aftercare following other organ transplant: Secondary | ICD-10-CM | POA: Diagnosis not present

## 2019-05-05 DIAGNOSIS — Z79899 Other long term (current) drug therapy: Secondary | ICD-10-CM | POA: Diagnosis not present

## 2019-05-27 ENCOUNTER — Other Ambulatory Visit (HOSPITAL_COMMUNITY): Payer: Self-pay | Admitting: Internal Medicine

## 2019-05-29 DIAGNOSIS — R739 Hyperglycemia, unspecified: Secondary | ICD-10-CM | POA: Diagnosis not present

## 2019-05-29 DIAGNOSIS — Z940 Kidney transplant status: Secondary | ICD-10-CM | POA: Diagnosis not present

## 2019-05-29 DIAGNOSIS — I1310 Hypertensive heart and chronic kidney disease without heart failure, with stage 1 through stage 4 chronic kidney disease, or unspecified chronic kidney disease: Secondary | ICD-10-CM | POA: Diagnosis not present

## 2019-05-29 DIAGNOSIS — Z79899 Other long term (current) drug therapy: Secondary | ICD-10-CM | POA: Diagnosis not present

## 2019-05-29 DIAGNOSIS — E785 Hyperlipidemia, unspecified: Secondary | ICD-10-CM | POA: Diagnosis not present

## 2019-05-29 DIAGNOSIS — I340 Nonrheumatic mitral (valve) insufficiency: Secondary | ICD-10-CM | POA: Diagnosis not present

## 2019-05-29 DIAGNOSIS — Z7952 Long term (current) use of systemic steroids: Secondary | ICD-10-CM | POA: Diagnosis not present

## 2019-05-29 DIAGNOSIS — Z941 Heart transplant status: Secondary | ICD-10-CM | POA: Diagnosis not present

## 2019-05-29 DIAGNOSIS — D849 Immunodeficiency, unspecified: Secondary | ICD-10-CM | POA: Diagnosis not present

## 2019-05-29 DIAGNOSIS — Z794 Long term (current) use of insulin: Secondary | ICD-10-CM | POA: Diagnosis not present

## 2019-05-29 DIAGNOSIS — N189 Chronic kidney disease, unspecified: Secondary | ICD-10-CM | POA: Diagnosis not present

## 2019-05-29 DIAGNOSIS — Z48298 Encounter for aftercare following other organ transplant: Secondary | ICD-10-CM | POA: Diagnosis not present

## 2019-05-29 DIAGNOSIS — Z4821 Encounter for aftercare following heart transplant: Secondary | ICD-10-CM | POA: Diagnosis not present

## 2019-05-29 DIAGNOSIS — T50905A Adverse effect of unspecified drugs, medicaments and biological substances, initial encounter: Secondary | ICD-10-CM | POA: Diagnosis not present

## 2019-05-29 DIAGNOSIS — E1122 Type 2 diabetes mellitus with diabetic chronic kidney disease: Secondary | ICD-10-CM | POA: Diagnosis not present

## 2019-05-29 DIAGNOSIS — I10 Essential (primary) hypertension: Secondary | ICD-10-CM | POA: Diagnosis not present

## 2019-05-30 ENCOUNTER — Telehealth (HOSPITAL_COMMUNITY): Payer: Self-pay | Admitting: *Deleted

## 2019-05-30 NOTE — Telephone Encounter (Signed)
Pt seen in follow up at the transplant clinic at [facility] on 5/13.  Called and spoke to pt regarding interest in participating in Cardiac rehab.  Pt verbalized that yes he would like.  Reviewed insurance benefits with pt.  Pt asked for assistance in establishing care locally for primary care and endocrinology. Gave him several options for him to review and determine who he would like to see. Pt seen in January 2020 at [employer] and wellness.  Gave pt this phone number as well.  Pt to keep me posted. Resent via fax request for MD referral order and requested documents. Cherre Huger, BSN Cardiac and Training and development officer

## 2019-06-26 ENCOUNTER — Telehealth (HOSPITAL_COMMUNITY): Payer: Self-pay | Admitting: *Deleted

## 2019-06-26 NOTE — Telephone Encounter (Signed)
No scheduled appt noted for local care - PCP.  Pt last appointment on 5/13 with the transplant clinic at [place] showed A1C 9.3.  Realizing this pt may be unable to schedule this appt on his own.  [place] and scheduled pt an appt.  Pt informed that his follow up appt is on 07/29/19 at 1:30.  Pt given the address which he plans to place in his GPS and phone number.  Will plan to call pt on the 12 as an additional reminder of his appt on 13th.  Pt made aware we are unable to proceed with scheduling him for rehab until he completes this follow up.  Stressed the importance of health management post heart transplant. Cherre Huger, BSN Cardiac and Training and development officer

## 2019-07-02 DIAGNOSIS — Z48298 Encounter for aftercare following other organ transplant: Secondary | ICD-10-CM | POA: Diagnosis not present

## 2019-07-02 DIAGNOSIS — E785 Hyperlipidemia, unspecified: Secondary | ICD-10-CM | POA: Diagnosis not present

## 2019-07-02 DIAGNOSIS — Z941 Heart transplant status: Secondary | ICD-10-CM | POA: Diagnosis not present

## 2019-07-16 DIAGNOSIS — E7800 Pure hypercholesterolemia, unspecified: Secondary | ICD-10-CM | POA: Diagnosis not present

## 2019-07-16 DIAGNOSIS — Z7952 Long term (current) use of systemic steroids: Secondary | ICD-10-CM | POA: Diagnosis not present

## 2019-07-16 DIAGNOSIS — D84821 Immunodeficiency due to drugs: Secondary | ICD-10-CM | POA: Diagnosis not present

## 2019-07-16 DIAGNOSIS — Z48288 Encounter for aftercare following multiple organ transplant: Secondary | ICD-10-CM | POA: Diagnosis not present

## 2019-07-16 DIAGNOSIS — E139 Other specified diabetes mellitus without complications: Secondary | ICD-10-CM | POA: Diagnosis not present

## 2019-07-16 DIAGNOSIS — Z940 Kidney transplant status: Secondary | ICD-10-CM | POA: Diagnosis not present

## 2019-07-16 DIAGNOSIS — Z79899 Other long term (current) drug therapy: Secondary | ICD-10-CM | POA: Diagnosis not present

## 2019-07-16 DIAGNOSIS — Z794 Long term (current) use of insulin: Secondary | ICD-10-CM | POA: Diagnosis not present

## 2019-07-16 DIAGNOSIS — Z941 Heart transplant status: Secondary | ICD-10-CM | POA: Diagnosis not present

## 2019-07-28 ENCOUNTER — Telehealth (HOSPITAL_COMMUNITY): Payer: Self-pay | Admitting: *Deleted

## 2019-07-28 NOTE — Telephone Encounter (Signed)
Called and left message for pt as a courtesy to remind him of his appt on tomorrow July 13th at 1:30. Also advised pt that he will need to complete this appt in order to schedule for cardiac rehab. Will also try again in the morning since this will be a 1;30 pm appointment. Cherre Huger, BSN Cardiac and Training and development officer

## 2019-07-29 ENCOUNTER — Encounter: Payer: Self-pay | Admitting: Family Medicine

## 2019-07-29 ENCOUNTER — Encounter (HOSPITAL_COMMUNITY): Payer: Self-pay

## 2019-07-29 ENCOUNTER — Other Ambulatory Visit: Payer: Self-pay

## 2019-07-29 ENCOUNTER — Emergency Department (HOSPITAL_COMMUNITY): Payer: Medicare Other

## 2019-07-29 ENCOUNTER — Inpatient Hospital Stay (HOSPITAL_COMMUNITY)
Admission: EM | Admit: 2019-07-29 | Discharge: 2019-07-31 | DRG: 698 | Disposition: A | Discharge status: Home / Self Care | Payer: Medicare Other | Attending: Internal Medicine | Admitting: Internal Medicine

## 2019-07-29 ENCOUNTER — Ambulatory Visit (HOSPITAL_BASED_OUTPATIENT_CLINIC_OR_DEPARTMENT_OTHER): Payer: Medicare Other | Admitting: Family Medicine

## 2019-07-29 VITALS — BP 85/62 | HR 81 | Ht 67.0 in | Wt 135.4 lb

## 2019-07-29 DIAGNOSIS — I10 Essential (primary) hypertension: Secondary | ICD-10-CM | POA: Diagnosis present

## 2019-07-29 DIAGNOSIS — Z8249 Family history of ischemic heart disease and other diseases of the circulatory system: Secondary | ICD-10-CM

## 2019-07-29 DIAGNOSIS — I951 Orthostatic hypotension: Secondary | ICD-10-CM | POA: Diagnosis present

## 2019-07-29 DIAGNOSIS — Y830 Surgical operation with transplant of whole organ as the cause of abnormal reaction of the patient, or of later complication, without mention of misadventure at the time of the procedure: Secondary | ICD-10-CM | POA: Diagnosis present

## 2019-07-29 DIAGNOSIS — Z794 Long term (current) use of insulin: Secondary | ICD-10-CM | POA: Diagnosis not present

## 2019-07-29 DIAGNOSIS — N183 Chronic kidney disease, stage 3 unspecified: Secondary | ICD-10-CM | POA: Diagnosis present

## 2019-07-29 DIAGNOSIS — Z809 Family history of malignant neoplasm, unspecified: Secondary | ICD-10-CM | POA: Diagnosis not present

## 2019-07-29 DIAGNOSIS — M109 Gout, unspecified: Secondary | ICD-10-CM | POA: Diagnosis present

## 2019-07-29 DIAGNOSIS — Z79899 Other long term (current) drug therapy: Secondary | ICD-10-CM

## 2019-07-29 DIAGNOSIS — R531 Weakness: Secondary | ICD-10-CM | POA: Diagnosis not present

## 2019-07-29 DIAGNOSIS — I129 Hypertensive chronic kidney disease with stage 1 through stage 4 chronic kidney disease, or unspecified chronic kidney disease: Secondary | ICD-10-CM | POA: Diagnosis present

## 2019-07-29 DIAGNOSIS — I9589 Other hypotension: Secondary | ICD-10-CM

## 2019-07-29 DIAGNOSIS — E0910 Drug or chemical induced diabetes mellitus with ketoacidosis without coma: Secondary | ICD-10-CM

## 2019-07-29 DIAGNOSIS — E1169 Type 2 diabetes mellitus with other specified complication: Secondary | ICD-10-CM | POA: Diagnosis present

## 2019-07-29 DIAGNOSIS — K219 Gastro-esophageal reflux disease without esophagitis: Secondary | ICD-10-CM | POA: Diagnosis present

## 2019-07-29 DIAGNOSIS — I252 Old myocardial infarction: Secondary | ICD-10-CM

## 2019-07-29 DIAGNOSIS — R739 Hyperglycemia, unspecified: Secondary | ICD-10-CM | POA: Diagnosis not present

## 2019-07-29 DIAGNOSIS — Z941 Heart transplant status: Secondary | ICD-10-CM

## 2019-07-29 DIAGNOSIS — N179 Acute kidney failure, unspecified: Secondary | ICD-10-CM | POA: Diagnosis present

## 2019-07-29 DIAGNOSIS — Z20822 Contact with and (suspected) exposure to covid-19: Secondary | ICD-10-CM | POA: Diagnosis present

## 2019-07-29 DIAGNOSIS — I255 Ischemic cardiomyopathy: Secondary | ICD-10-CM | POA: Diagnosis present

## 2019-07-29 DIAGNOSIS — Z7982 Long term (current) use of aspirin: Secondary | ICD-10-CM | POA: Diagnosis not present

## 2019-07-29 DIAGNOSIS — E1110 Type 2 diabetes mellitus with ketoacidosis without coma: Secondary | ICD-10-CM | POA: Diagnosis present

## 2019-07-29 DIAGNOSIS — IMO0002 Reserved for concepts with insufficient information to code with codable children: Secondary | ICD-10-CM | POA: Diagnosis present

## 2019-07-29 DIAGNOSIS — E871 Hypo-osmolality and hyponatremia: Secondary | ICD-10-CM | POA: Diagnosis present

## 2019-07-29 DIAGNOSIS — I959 Hypotension, unspecified: Secondary | ICD-10-CM

## 2019-07-29 DIAGNOSIS — T8619 Other complication of kidney transplant: Secondary | ICD-10-CM | POA: Diagnosis present

## 2019-07-29 DIAGNOSIS — E1122 Type 2 diabetes mellitus with diabetic chronic kidney disease: Secondary | ICD-10-CM | POA: Diagnosis present

## 2019-07-29 DIAGNOSIS — E785 Hyperlipidemia, unspecified: Secondary | ICD-10-CM | POA: Diagnosis present

## 2019-07-29 LAB — COMPREHENSIVE METABOLIC PANEL
ALT: 23 U/L (ref 0–44)
AST: 28 U/L (ref 15–41)
Albumin: 3.7 g/dL (ref 3.5–5.0)
Alkaline Phosphatase: 98 U/L (ref 38–126)
Anion gap: 18 — ABNORMAL HIGH (ref 5–15)
BUN: 44 mg/dL — ABNORMAL HIGH (ref 6–20)
CO2: 20 mmol/L — ABNORMAL LOW (ref 22–32)
Calcium: 10 mg/dL (ref 8.9–10.3)
Chloride: 92 mmol/L — ABNORMAL LOW (ref 98–111)
Creatinine, Ser: 2.23 mg/dL — ABNORMAL HIGH (ref 0.61–1.24)
GFR calc Af Amer: 37 mL/min — ABNORMAL LOW (ref >60)
GFR calc non Af Amer: 32 mL/min — ABNORMAL LOW (ref >60)
Glucose, Bld: 320 mg/dL — ABNORMAL HIGH (ref 70–99)
Potassium: 3.6 mmol/L (ref 3.5–5.1)
Sodium: 130 mmol/L — ABNORMAL LOW (ref 135–145)
Total Bilirubin: 1.1 mg/dL (ref 0.3–1.2)
Total Protein: 7.3 g/dL (ref 6.5–8.1)

## 2019-07-29 LAB — URINALYSIS, ROUTINE W REFLEX MICROSCOPIC
Bacteria, UA: NONE SEEN
Bilirubin Urine: NEGATIVE
Glucose, UA: >=500 mg/dL — AB
Hgb urine dipstick: NEGATIVE
Ketones, ur: 20 mg/dL — AB
Leukocytes,Ua: NEGATIVE
Nitrite: NEGATIVE
Protein, ur: NEGATIVE mg/dL
Specific Gravity, Urine: 1.017 (ref 1.005–1.030)
pH: 5 (ref 5.0–8.0)

## 2019-07-29 LAB — I-STAT VENOUS BLOOD GAS, ED
Acid-Base Excess: 2 mmol/L (ref 0.0–2.0)
Bicarbonate: 25.7 mmol/L (ref 20.0–28.0)
Calcium, Ion: 1.1 mmol/L — ABNORMAL LOW (ref 1.15–1.40)
HCT: 50 % (ref 39.0–52.0)
Hemoglobin: 17 g/dL (ref 13.0–17.0)
O2 Saturation: 99 %
Potassium: 4 mmol/L (ref 3.5–5.1)
Sodium: 133 mmol/L — ABNORMAL LOW (ref 135–145)
TCO2: 27 mmol/L (ref 22–32)
pCO2, Ven: 36 mmHg — ABNORMAL LOW (ref 44.0–60.0)
pH, Ven: 7.461 — ABNORMAL HIGH (ref 7.250–7.430)
pO2, Ven: 136 mmHg — ABNORMAL HIGH (ref 32.0–45.0)

## 2019-07-29 LAB — CBC WITH DIFFERENTIAL/PLATELET
Abs Immature Granulocytes: 0.09 10*3/uL — ABNORMAL HIGH (ref 0.00–0.07)
Basophils Absolute: 0 10*3/uL (ref 0.0–0.1)
Basophils Relative: 0 %
Eosinophils Absolute: 0 10*3/uL (ref 0.0–0.5)
Eosinophils Relative: 0 %
HCT: 47 % (ref 39.0–52.0)
Hemoglobin: 16.3 g/dL (ref 13.0–17.0)
Immature Granulocytes: 1 %
Lymphocytes Relative: 8 %
Lymphs Abs: 0.6 10*3/uL — ABNORMAL LOW (ref 0.7–4.0)
MCH: 32 pg (ref 26.0–34.0)
MCHC: 34.7 g/dL (ref 30.0–36.0)
MCV: 92.2 fL (ref 80.0–100.0)
Monocytes Absolute: 0.4 10*3/uL (ref 0.1–1.0)
Monocytes Relative: 5 %
Neutro Abs: 6.6 10*3/uL (ref 1.7–7.7)
Neutrophils Relative %: 86 %
Platelets: 242 10*3/uL (ref 150–400)
RBC: 5.1 MIL/uL (ref 4.22–5.81)
RDW: 12.2 % (ref 11.5–15.5)
WBC: 7.8 10*3/uL (ref 4.0–10.5)
nRBC: 0 % (ref 0.0–0.2)

## 2019-07-29 LAB — CBG MONITORING, ED
Glucose-Capillary: 338 mg/dL — ABNORMAL HIGH (ref 70–99)
Glucose-Capillary: 340 mg/dL — ABNORMAL HIGH (ref 70–99)
Glucose-Capillary: 242 mg/dL — ABNORMAL HIGH (ref 70–99)
Glucose-Capillary: 222 mg/dL — ABNORMAL HIGH (ref 70–99)
Glucose-Capillary: >600 mg/dL (ref 70–99)
Glucose-Capillary: 102 mg/dL — ABNORMAL HIGH (ref 70–99)
Glucose-Capillary: 87 mg/dL (ref 70–99)

## 2019-07-29 LAB — POCT GLYCOSYLATED HEMOGLOBIN (HGB A1C): HbA1c, POC (controlled diabetic range): 14.3 % — AB (ref 0.0–7.0)

## 2019-07-29 LAB — BETA-HYDROXYBUTYRIC ACID: Beta-Hydroxybutyric Acid: 1.87 mmol/L — ABNORMAL HIGH (ref 0.05–0.27)

## 2019-07-29 LAB — GLUCOSE, POCT (MANUAL RESULT ENTRY): POC Glucose: 345 mg/dl — AB (ref 70–99)

## 2019-07-29 LAB — SARS CORONAVIRUS 2 BY RT PCR (HOSPITAL ORDER, PERFORMED IN ~~LOC~~ HOSPITAL LAB): SARS Coronavirus 2: NEGATIVE

## 2019-07-29 LAB — LACTIC ACID, PLASMA
Lactic Acid, Venous: 2.5 mmol/L (ref 0.5–1.9)
Lactic Acid, Venous: 2.3 mmol/L (ref 0.5–1.9)

## 2019-07-29 LAB — PROTIME-INR
INR: 1.1 (ref 0.8–1.2)
Prothrombin Time: 13.3 seconds (ref 11.4–15.2)

## 2019-07-29 MED ORDER — DEXTROSE-NACL 5-0.45 % IV SOLN: INTRAVENOUS | Status: DC

## 2019-07-29 MED ORDER — POTASSIUM CHLORIDE 10 MEQ/100ML IV SOLN
10.0000 meq | INTRAVENOUS | Status: AC
  Administered 2019-07-29: 10 meq via INTRAVENOUS
  Filled 2019-07-29: qty 100

## 2019-07-29 MED ORDER — LACTATED RINGERS IV BOLUS
1000.0000 mL | Freq: Once | INTRAVENOUS | Status: AC
  Administered 2019-07-29: 1000 mL via INTRAVENOUS

## 2019-07-29 MED ORDER — DEXTROSE 50 % IV SOLN: 0.0000 mL | INTRAVENOUS | Status: DC | PRN

## 2019-07-29 MED ORDER — SODIUM CHLORIDE 0.9 % IV SOLN: INTRAVENOUS | Status: DC

## 2019-07-29 MED ORDER — ENOXAPARIN SODIUM 40 MG/0.4ML ~~LOC~~ SOLN
40.0000 mg | SUBCUTANEOUS | Status: DC
  Administered 2019-07-30: 40 mg via SUBCUTANEOUS
  Filled 2019-07-29 (×2): qty 0.4

## 2019-07-29 MED ORDER — INSULIN REGULAR(HUMAN) IN NACL 100-0.9 UT/100ML-% IV SOLN
INTRAVENOUS | Status: DC
  Administered 2019-07-29: 6.5 [IU]/h via INTRAVENOUS
  Filled 2019-07-29: qty 100

## 2019-07-29 NOTE — H&P (Signed)
History and Physical    Zyir Gassert ZOX:[id number] DOB: [birth year] DOA: 07/29/2019  PCP: Charlott Rakes, MD  Patient coming from: Home  I have personally briefly reviewed patient's old medical records in [place]  Chief Complaint: Near syncope, elevated blood sugar  HPI: Marvin Martin is a 54 y.o. male with medical history significant of ischemic cardiomyopathy status post heart transplant and kidney transplant performed at [facility] in 01/05/2019-on chronic immunosuppressive therapy (prednisone/tacrolimus/mycophenolate), insulin-dependent diabetes mellitus, hypertension, hyperlipidemia, CKD stage III, GERD, chronic constipation presents to emergency department for evaluation of near syncope and elevated blood sugar.  Patient was seen by his PCP and was found to be hypotensive with blood pressure of 85/62, blood sugar of 345 and A1c of 14.3 therefore patient sent to emergency department for further evaluation and management.  Patient tells me that he was lightheaded last night fell to the ground however denies loss of consciousness, seizure, head trauma, nausea, vomiting, chest pain, shortness of breath, cough, congestion, fever, chills, abdominal pain, urinary or bowel changes.  He tells me that he uses regular insulin 4 units 3 times daily and Lantus of 15 units at bedtime.  He reports that he missed couple of doses however he is trying to be compliant with his insulin regime.  He tells me that he is very compliant with diet and drink a lot of water, however does not exercise regularly.  Of note: This is patient's second admission in last 3 months with similar symptoms.  He lives with his wife at home.  No history of smoking, alcohol, listed drug use.  ED Course: Upon arrival to ED: Patient's blood pressure was noted to be 85/67, afebrile with no leukocytosis, blood glucose, 345, A1c: 14.3, sodium of 130, CO2: 20, CMP shows worsening kidney function, anion gap of 18, lactic acid:  2.5, pH: 7.4, CBC, INR, blood culture, COVID-19, beta hydroxybutyric acid: Pending.  Patient received IV fluid bolus x2.  [facility] hospitalist consulted for admission for worsening kidney function and possibly DKA.  Review of Systems: As per HPI otherwise negative.    Past Medical History:  Diagnosis Date  . Chronic systolic CHF (congestive heart failure) ([place])    a. 01/2015 Echo: EF 20-25%, antlat, inflat AK.  [name] CKD (chronic kidney disease), stage III   . Coronary artery disease    a. s/p MI x 4;  b. Reported h/o 17 stents with recurrent ISR;  c. 02/2014 Cath ([place]): PCI to unknown vessel;  d. 01/2015 MV: EF 18%, large scar, no ischemia; e. 03/2015 PCI: LM nl, LAD nl, D2 patent stent, RI patent stent, LCX patent stents p/m, RCA 95p ISR (PTCA), 39m, 100d into RPL CTO, RPDA 90 (2.5x16 Synergy DES).  . Hyperlipidemia   . Hypertensive heart disease   . Ischemic cardiomyopathy    a. s/p SJM ICD 2007 w/ gen change in 2012; b. 12/2014 CPX Test: mild to mod HF limitation w/ pVO2 20.3 and nl slope;  c. 01/2015 Echo: EF 20-25%.    Past Surgical History:  Procedure Laterality Date  . 17 stints placements Bilateral   . CARDIAC CATHETERIZATION     Most recent 02/2014   . CARDIAC CATHETERIZATION N/A 03/31/2015   Procedure: Left Heart Cath and Coronary Angiography;  Surgeon: Troy Sine, MD;  Location: [place] CV LAB;  Service: Cardiovascular;  Laterality: N/A;  . CARDIAC CATHETERIZATION N/A 03/31/2015   Procedure: Coronary Stent Intervention;  Surgeon: Troy Sine, MD;  Location: [place] CV LAB;  Service:  Cardiovascular;  Laterality: N/A;  . CARDIAC CATHETERIZATION N/A 05/27/2015   Procedure: Right/Left Heart Cath and Coronary Angiography;  Surgeon: Jolaine Artist, MD;  Location: [place] CV LAB;  Service: Cardiovascular;  Laterality: N/A;  . CARDIAC CATHETERIZATION N/A 06/23/2015   Procedure: Right/Left Heart Cath and Coronary Angiography;  Surgeon: Jolaine Artist, MD;  Location: [place] CV LAB;  Service: Cardiovascular;  Laterality: N/A;  . HEART TRANSPLANT    . INSERT / REPLACE / REMOVE PACEMAKER     St jude 2006  Generator Change 2012   . NEPHRECTOMY TRANSPLANTED ORGAN    . PACEMAKER GENERATOR CHANGE Bilateral      reports that he has never smoked. He has never used smokeless tobacco. He reports that he does not drink alcohol and does not use drugs.  No Known Allergies  Family History  Problem Relation Age of Onset  . Cancer Mother        died when pt was 3.  She had a h/o heart dzs as well.  . Hypertension Mother   . Hyperlipidemia Father        Father died when pt was age 40 - he believes that he had a cardiac hx.  [name] CAD Sister     Prior to Admission medications   Medication Sig Start Date End Date Taking? Authorizing Provider  aspirin 81 MG tablet Take 81 mg by mouth daily.   Yes [provider]  carvedilol (COREG) 12.5 MG tablet Take 12.5 mg by mouth 2 (two) times daily. 03/23/19  Yes [provider]  doxazosin (CARDURA) 2 MG tablet Take 2 mg by mouth at bedtime. 03/11/19  Yes [provider]  ezetimibe (ZETIA) 10 MG tablet Take 1 tablet (10 mg total) by mouth daily. Needs appt for further refills Patient taking differently: Take 10 mg by mouth daily.  05/27/19  Yes Bensimhon, Shaune Pascal, MD  insulin aspart (NOVOLOG FLEXPEN) 100 UNIT/ML FlexPen Inject 3 Units into the skin 3 (three) times daily with meals. And per sliding scale QID Patient taking differently: Inject 3 Units into the skin See admin instructions. Inject 3 units into the skin 3-4 times a day after meals, per sliding scale 04/07/19  Yes Kamineni, Lamount Cranker, MD  insulin glargine (LANTUS SOLOSTAR) 100 UNIT/ML Solostar Pen Inject 15 Units into the skin daily. Patient taking differently: Inject 15 Units into the skin at bedtime.  04/07/19  Yes Guilford Shi, MD  isosorbide mononitrate (IMDUR) 30 MG 24 hr tablet Take 30 mg by mouth daily.   Yes [provider]    magnesium oxide (MAG-OX) 400 (241.3 Mg) MG tablet Take 400 mg by mouth 2 (two) times daily.  03/11/19  Yes [provider]  nystatin (MYCOSTATIN) 100000 UNIT/ML suspension Take 5 mLs by mouth See admin instructions. Swish and swallow 5 ml's by mouth four times a day 02/21/19  Yes [provider]  pantoprazole (PROTONIX) 40 MG tablet Take 1 tablet by mouth at bedtime.  01/07/19 01/07/20 Yes [provider]  PRALUENT 75 MG/ML SOAJ INJECT 75 MG INTO THE SKIN EVERY 14 (FOURTEEN) DAYS. 05/01/19  Yes Bensimhon, Shaune Pascal, MD  amLODipine (NORVASC) 10 MG tablet TAKE 1 TABLET BY MOUTH EVERY DAY 11/15/18   Bensimhon, Shaune Pascal, MD  atovaquone [facility]) 750 MG/5ML suspension Take 10 mLs by mouth daily. Patient not taking: Reported on 07/29/2019 01/23/19   [provider]  Calcium Carbonate-Vitamin D 600-400 MG-UNIT tablet Take 1 tablet by mouth daily. 01/06/19 01/06/20  [provider]  cholecalciferol (VITAMIN D) 1000 UNITS tablet Take 1,000 Units by mouth daily.    [provider]  HYDROcodone-acetaminophen (NORCO/VICODIN) 5-325 MG tablet Take 1 tablet by mouth every 4 (four) hours as needed. Patient not taking: Reported on 07/29/2019 12/16/17   Recardo Evangelist, PA-C  Insulin Pen Needle 32G X 4 MM MISC 1 application by Does not apply route 4 (four) times daily. 04/07/19   Guilford Shi, MD  magnesium oxide (MAG-OX) 400 MG tablet Take 400 mg by mouth 2 (two) times daily. 07/02/19   [provider]  mycophenolate (CELLCEPT) 500 MG tablet Take 1,500 mg by mouth in the morning and at bedtime. 01/06/19 01/06/20  [provider]  nitroGLYCERIN (NITROSTAT) 0.4 MG SL tablet Place 1 tablet (0.4 mg total) under the tongue every 5 (five) minutes as needed for chest pain. Reported on 04/12/2015 06/03/15   Charlott Rakes, MD  omega-3 acid ethyl esters (LOVAZA) 1 G capsule Take 1 g by mouth daily.     [provider]  ondansetron (ZOFRAN) 4 MG  tablet Take 1 tablet (4 mg total) every 8 (eight) hours as needed by mouth for nausea or vomiting. Reported on 05/24/2015 11/20/16   Darrick Grinder D, NP  oxyCODONE (OXY IR/ROXICODONE) 5 MG immediate release tablet Take 5 mg by mouth 4 (four) times daily as needed. Patient not taking: Reported on 07/29/2019 01/07/19   [provider]  rosuvastatin (CRESTOR) 40 MG tablet TAKE 1 TABLET BY MOUTH EVERY DAY Patient not taking: Reported on 07/29/2019 09/24/18   Bensimhon, Shaune Pascal, MD  senna-docusate (SENOKOT-S) 8.6-50 MG tablet Take 2 tablets by mouth daily as needed. 01/08/19 01/08/20  [provider]  tacrolimus (PROGRAF) 1 MG capsule Take 7 mg by mouth in the morning and at bedtime.  03/11/19 03/10/20  [provider]  torsemide (DEMADEX) 10 MG tablet Take 10 mg by mouth daily as needed.  01/08/19   [provider]  valGANciclovir (VALCYTE) 450 MG tablet Take 450 mg by mouth daily. 01/06/19   [provider]    Physical Exam: Vitals:   07/29/19 1430 07/29/19 1452 07/29/19 1530 07/29/19 1716  BP: (!) 85/67  92/81 (!) 111/94  Pulse: 82 81 78 77  Resp: 18  (!) 29 18  Temp: 98.3 F (36.8 C)     TempSrc: Oral     SpO2: 99% 99% 90% 98%    Constitutional: NAD, calm, comfortable, on room air, communicating well Eyes: PERRL, lids and conjunctivae normal ENMT: Mucous membranes are moist. Posterior pharynx clear of any exudate or lesions.Normal dentition.  Neck: normal, supple, no masses, no thyromegaly Respiratory: clear to auscultation bilaterally, no wheezing, no crackles. Normal respiratory effort. No accessory muscle use.  Cardiovascular: Regular rate and rhythm, no murmurs / rubs / gallops. No extremity edema. 2+ pedal pulses. No carotid bruits.  Abdomen: no tenderness, no masses palpated. No hepatosplenomegaly. Bowel sounds positive.  Musculoskeletal: no clubbing / cyanosis. No joint deformity upper and lower extremities. Good ROM, no contractures. Normal  muscle tone.  Skin: no rashes, lesions, ulcers. No induration Neurologic: CN 2-12 grossly intact. Sensation intact, DTR normal. Strength 5/5 in all 4.  Psychiatric: Normal judgment and insight. Alert and oriented x 3. Normal mood.    Labs on Admission: I have personally reviewed following labs and imaging studies  CBC: Recent Labs  Lab 07/29/19 1450 07/29/19 1643  WBC 7.8  --   NEUTROABS 6.6  --   HGB 16.3 17.0  HCT 47.0 50.0  MCV 92.2  --   PLT 242  --    Basic Metabolic Panel: Recent Labs  Lab 07/29/19 1450 07/29/19 1643  NA 130* 133*  K 3.6 4.0  CL 92*  --   CO2 20*  --   GLUCOSE 320*  --   BUN 44*  --   CREATININE 2.23*  --   CALCIUM 10.0  --    GFR: Estimated Creatinine Clearance: 32.9 mL/min (A) (by C-G formula based on SCr of 2.23 mg/dL (H)). Liver Function Tests: Recent Labs  Lab 07/29/19 1450  AST 28  ALT 23  ALKPHOS 98  BILITOT 1.1  PROT 7.3  ALBUMIN 3.7   No results for input(s): LIPASE, AMYLASE in the last 168 hours. No results for input(s): AMMONIA in the last 168 hours. Coagulation Profile: Recent Labs  Lab 07/29/19 1450  INR 1.1   Cardiac Enzymes: No results for input(s): CKTOTAL, CKMB, CKMBINDEX, TROPONINI in the last 168 hours. BNP (last 3 results) No results for input(s): PROBNP in the last 8760 hours. HbA1C: Recent Labs    07/29/19 1438  HGBA1C 14.3*   CBG: Recent Labs  Lab 07/29/19 1528 07/29/19 1545  GLUCAP 338* 340*   Lipid Profile: No results for input(s): CHOL, HDL, LDLCALC, TRIG, CHOLHDL, LDLDIRECT in the last 72 hours. Thyroid Function Tests: No results for input(s): TSH, T4TOTAL, FREET4, T3FREE, THYROIDAB in the last 72 hours. Anemia Panel: No results for input(s): VITAMINB12, FOLATE, FERRITIN, TIBC, IRON, RETICCTPCT in the last 72 hours. Urine analysis:    Component Value Date/Time   COLORURINE STRAW (A) 04/05/2019 1136   APPEARANCEUR CLEAR 04/05/2019 1136   LABSPEC 1.025 04/05/2019 1136   PHURINE 5.0  04/05/2019 1136   GLUCOSEU >=500 (A) 04/05/2019 1136   HGBUR NEGATIVE 04/05/2019 1136   BILIRUBINUR NEGATIVE 04/05/2019 1136   KETONESUR 5 (A) 04/05/2019 1136   PROTEINUR NEGATIVE 04/05/2019 1136   NITRITE NEGATIVE 04/05/2019 1136   LEUKOCYTESUR NEGATIVE 04/05/2019 1136    Radiological Exams on Admission: DG Chest Portable 1 View  Result Date: 07/29/2019 CLINICAL DATA:  Syncope, weakness EXAM: PORTABLE CHEST 1 VIEW COMPARISON:  04/05/2019 FINDINGS: Single frontal view of the chest demonstrates stable postsurgical changes from median sternotomy. Abandoned lead from previous AICD unchanged in position. Cardiac silhouette is unremarkable. No airspace disease, effusion, or pneumothorax. IMPRESSION: 1. Stable exam, no acute process. Electronically Signed   By: Randa Ngo M.D.   On: 07/29/2019 16:18    EKG: Independently reviewed.  Sinus rhythm, no ST elevation or depression noted.  Assessment/Plan Principal Problem:   DKA (diabetic ketoacidoses) (HCC) Active Problems:   Benign essential HTN   Cardiomyopathy, ischemic   Hyperlipidemia   Acute on chronic kidney failure (HCC)   H/O heart transplant ([place])   DM (diabetes mellitus), secondary uncontrolled ([place])   Hyponatremia   Hypotension   Uncontrolled insulin-dependent diabetes mellitus/DKA: -Blood glucose upon arrival: 345, A1c: 14.3, CO2 of 20, anion gap of 18, lactic acid: 2.5.  Beta hydroxybutyric acid: Pending, pH: 7.4 -Patient received IV fluid bolus x2 in ED.  Patient is afebrile with no leukocytosis.  Chest x-ray is negative for acute findings.  COVID-19: Pending -Admit patient at stepdown unit for close monitoring. -Start on DKA protocol.  Insulin drip.  Monitor blood glucose every 4 hour, BMP: Every 4 hour -Check electrolytes, check UA. -Needs insulin dose adjustment.  Encouraged carb consistent diet, exercise. -We will consult diabetic counselor -Monitor vitals closely.  Hypotensive: Blood pressure 85/67 -Patient  received IV fluid bolus in ED.  Blood pressure is currently stable -Continue to hold home BP meds for now. -Resume home BP meds when blood pressure is back to baseline  AKI on CKD stage II/III: S/p renal transplant -Creatinine: 2.23, GFR: 37  -Continue IV fluids.  Hold nephrotoxic medication.  Repeat BMP tomorrow a.m.  Near syncope: -Likely secondary to hyperglycemia.  Patient is alert and oriented x4. -Consult PT/OT -On fall precautions.  Hyponatremia: Sodium of 130 -Likely pseudohyponatremia due to hyperglycemia -Corrected sodium level: 134 -Continue to monitor  Hyperlipidemia: Continue Crestor, Pravachol, Zetia, Lovaza  GERD: Continue PPI  Gout: Continue allopurinol (renal dose)  Status post heart and renal transplant: Stable -We will continue home immunosuppressant  Unable to safely start patient's home meds as med reconciliation is pending by pharmacy.  DVT prophylaxis: Lovenox/SCD Code Status: Full code Family Communication: Patient's wife present at bedside.  Plan of care discussed with patient in length and he verbalized understanding and agreed with it. Disposition Plan: Home in 2 days Consults called: None Admission status: Inpatient   Mckinley Jewel MD [facility]  If 7PM-7AM, please contact night-coverage [email or web address] Password [facility]  07/29/2019, 5:47 PM

## 2019-07-29 NOTE — ED Provider Notes (Signed)
[facility] EMERGENCY DEPARTMENT Provider Note   CSN: [id number] Arrival date & time: 07/29/19  1416     History Chief Complaint  Patient presents with  . Weakness    Marvin Martin is a 54 y.o. male.  HPI 54 year old male presents with syncope.  Patient started feeling bad yesterday which she describes as generalized weakness/fatigue and lightheadedness.  Yesterday evening he got up off the couch and then thinks he passed out because he ended up on the ground about 2 or 3 feet later.  He has been persistently lightheaded.  Went to PCP and his blood pressure was in the 80s and he was sent here.  His glucose has also been a little high in the 200s and 300s.  In December 2020 he had a dual cardiac and renal transplant.  He states he has been doing well with this.  Appears his baseline creatinine is now 1.5.  He is followed at [facility].  He denies feeling ill prior to this including no fever, vomiting, shortness of breath, cough, palpitations or chest pain. No decreased PO intake.   Past Medical History:  Diagnosis Date  . Chronic systolic CHF (congestive heart failure) ([place])    a. 01/2015 Echo: EF 20-25%, antlat, inflat AK.  [name] CKD (chronic kidney disease), stage III   . Coronary artery disease    a. s/p MI x 4;  b. Reported h/o 17 stents with recurrent ISR;  c. 02/2014 Cath ([place]): PCI to unknown vessel;  d. 01/2015 MV: EF 18%, large scar, no ischemia; e. 03/2015 PCI: LM nl, LAD nl, D2 patent stent, RI patent stent, LCX patent stents p/m, RCA 95p ISR (PTCA), 55m, 100d into RPL CTO, RPDA 90 (2.5x16 Synergy DES).  . Hyperlipidemia   . Hypertensive heart disease   . Ischemic cardiomyopathy    a. s/p SJM ICD 2007 w/ gen change in 2012; b. 12/2014 CPX Test: mild to mod HF limitation w/ pVO2 20.3 and nl slope;  c. 01/2015 Echo: EF 20-25%.    Patient Active Problem List   Diagnosis Date Noted  . DM (diabetes mellitus), secondary uncontrolled ([place]) 07/29/2019  . Hyponatremia  07/29/2019  . Hypotension 07/29/2019  . Drug-induced hyperglycemia 04/05/2019  . Renal transplant, status post 04/05/2019  . H/O heart transplant ([place]) 04/05/2019  . Gout 01/07/2018  . Unstable angina ([place]) 06/22/2015  . Recurrent angina status post coronary stent placement ([place])   . Acute on chronic kidney failure ([place])   . Hypertensive heart disease 04/03/2015  . Hyperlipidemia 04/03/2015  . ST elevation (STEMI) myocardial infarction involving right coronary artery ([place]) 03/31/2015  . Ischemic chest pain ([place])   . Cardiomyopathy, ischemic   . CKD (chronic kidney disease) stage 3, GFR 30-59 ml/min 02/09/2015  . Chronic systolic heart failure ([place]) 01/07/2015  . CAD in native artery 01/07/2015  . Benign essential HTN 01/07/2015  . CHF (congestive heart failure) ([place]) 12/11/2014    Past Surgical History:  Procedure Laterality Date  . 17 stints placements Bilateral   . CARDIAC CATHETERIZATION     Most recent 02/2014   . CARDIAC CATHETERIZATION N/A 03/31/2015   Procedure: Left Heart Cath and Coronary Angiography;  Surgeon: Troy Sine, MD;  Location: [place] [facility];  Service: Cardiovascular;  Laterality: N/A;  . CARDIAC CATHETERIZATION N/A 03/31/2015   Procedure: Coronary Stent Intervention;  Surgeon: Troy Sine, MD;  Location: [place] [facility];  Service: Cardiovascular;  Laterality: N/A;  . CARDIAC CATHETERIZATION N/A 05/27/2015  Procedure: Right/Left Heart Cath and Coronary Angiography;  Surgeon: Jolaine Artist, MD;  Location: [place];  Service: Cardiovascular;  Laterality: N/A;  . CARDIAC CATHETERIZATION N/A 06/23/2015   Procedure: Right/Left Heart Cath and Coronary Angiography;  Surgeon: Jolaine Artist, MD;  Location: [facility];  Service: Cardiovascular;  Laterality: N/A;  . HEART TRANSPLANT    . INSERT / REPLACE / REMOVE PACEMAKER     St jude 2006  Generator Change 2012   . NEPHRECTOMY TRANSPLANTED ORGAN    . PACEMAKER GENERATOR CHANGE  Bilateral        Family History  Problem Relation Age of Onset  . Cancer Mother        died when pt was 18.  She had a h/o heart dzs as well.  . Hypertension Mother   . Hyperlipidemia Father        Father died when pt was age 68 - he believes that he had a cardiac hx.  [name] CAD Sister     Social History   Tobacco Use  . Smoking status: Never Smoker  . Smokeless tobacco: Never Used  Vaping Use  . Vaping Use: Never used  Substance Use Topics  . Alcohol use: No    Alcohol/week: 0.0 standard drinks  . Drug use: No    Home Medications Prior to Admission medications   Medication Sig Start Date End Date Taking? Authorizing Provider  aspirin 81 MG tablet Take 81 mg by mouth daily.   Yes [provider]  carvedilol (COREG) 12.5 MG tablet Take 12.5 mg by mouth 2 (two) times daily. 03/23/19  Yes [provider]  doxazosin (CARDURA) 2 MG tablet Take 2 mg by mouth at bedtime. 03/11/19  Yes [provider]  ezetimibe (ZETIA) 10 MG tablet Take 1 tablet (10 mg total) by mouth daily. Needs appt for further refills Patient taking differently: Take 10 mg by mouth daily.  05/27/19  Yes Bensimhon, Shaune Pascal, MD  insulin aspart (NOVOLOG FLEXPEN) 100 UNIT/ML FlexPen Inject 3 Units into the skin 3 (three) times daily with meals. And per sliding scale QID Patient taking differently: Inject 3 Units into the skin See admin instructions. Inject 3 units into the skin 3-4 times a day after meals, per sliding scale 04/07/19  Yes Kamineni, Lamount Cranker, MD  insulin glargine (LANTUS SOLOSTAR) 100 UNIT/ML Solostar Pen Inject 15 Units into the skin daily. Patient taking differently: Inject 15 Units into the skin at bedtime.  04/07/19  Yes Guilford Shi, MD  isosorbide mononitrate (IMDUR) 30 MG 24 hr tablet Take 30 mg by mouth daily.   Yes [provider]  magnesium oxide (MAG-OX) 400 (241.3 Mg) MG tablet Take 400 mg by mouth 2 (two) times daily.  03/11/19  Yes [provider]    nystatin (MYCOSTATIN) 100000 UNIT/ML suspension Take 5 mLs by mouth See admin instructions. Swish and swallow 5 ml's by mouth four times a day 02/21/19  Yes [provider]  pantoprazole (PROTONIX) 40 MG tablet Take 1 tablet by mouth at bedtime.  01/07/19 01/07/20 Yes [provider]  PRALUENT 75 MG/ML SOAJ INJECT 75 MG INTO THE SKIN EVERY 14 (FOURTEEN) DAYS. 05/01/19  Yes Bensimhon, Shaune Pascal, MD  amLODipine (NORVASC) 10 MG tablet TAKE 1 TABLET BY MOUTH EVERY DAY 11/15/18   Bensimhon, Shaune Pascal, MD  atovaquone [facility]) 750 MG/5ML suspension Take 10 mLs by mouth daily. Patient not taking: Reported on 07/29/2019 01/23/19   [provider]  Calcium Carbonate-Vitamin  D 600-400 MG-UNIT tablet Take 1 tablet by mouth daily. 01/06/19 01/06/20  [provider]  cholecalciferol (VITAMIN D) 1000 UNITS tablet Take 1,000 Units by mouth daily.    [provider]  HYDROcodone-acetaminophen (NORCO/VICODIN) 5-325 MG tablet Take 1 tablet by mouth every 4 (four) hours as needed. Patient not taking: Reported on 07/29/2019 12/16/17   Recardo Evangelist, PA-C  Insulin Pen Needle 32G X 4 MM MISC 1 application by Does not apply route 4 (four) times daily. 04/07/19   Guilford Shi, MD  magnesium oxide (MAG-OX) 400 MG tablet Take 400 mg by mouth 2 (two) times daily. 07/02/19   [provider]  mycophenolate (CELLCEPT) 500 MG tablet Take 1,500 mg by mouth in the morning and at bedtime. 01/06/19 01/06/20  [provider]  nitroGLYCERIN (NITROSTAT) 0.4 MG SL tablet Place 1 tablet (0.4 mg total) under the tongue every 5 (five) minutes as needed for chest pain. Reported on 04/12/2015 06/03/15   Charlott Rakes, MD  omega-3 acid ethyl esters (LOVAZA) 1 G capsule Take 1 g by mouth daily.     [provider]  ondansetron (ZOFRAN) 4 MG tablet Take 1 tablet (4 mg total) every 8 (eight) hours as needed by mouth for nausea or vomiting. Reported on 05/24/2015 11/20/16   Darrick Grinder D, NP  oxyCODONE (OXY IR/ROXICODONE) 5 MG immediate release tablet Take 5 mg by mouth 4 (four) times daily as needed. Patient not taking: Reported on 07/29/2019 01/07/19   [provider]  rosuvastatin (CRESTOR) 40 MG tablet TAKE 1 TABLET BY MOUTH EVERY DAY Patient not taking: Reported on 07/29/2019 09/24/18   Bensimhon, Shaune Pascal, MD  senna-docusate (SENOKOT-S) 8.6-50 MG tablet Take 2 tablets by mouth daily as needed. 01/08/19 01/08/20  [provider]  tacrolimus (PROGRAF) 1 MG capsule Take 7 mg by mouth in the morning and at bedtime.  03/11/19 03/10/20  [provider]  torsemide (DEMADEX) 10 MG tablet Take 10 mg by mouth daily as needed.  01/08/19   [provider]  valGANciclovir (VALCYTE) 450 MG tablet Take 450 mg by mouth daily. 01/06/19   [provider]    Allergies    Patient has no known allergies.  Review of Systems   Review of Systems  Constitutional: Negative for fever.  Respiratory: Negative for cough and shortness of breath.   Cardiovascular: Negative for chest pain and leg swelling.  Gastrointestinal: Negative for abdominal pain, diarrhea and vomiting.  Genitourinary:       No change in urination  Neurological: Positive for syncope and light-headedness. Negative for headaches.  All other systems reviewed and are negative.   Physical Exam Updated Vital Signs BP 92/81   Pulse 78   Temp 98.3 F (36.8 C) (Oral)   Resp (!) 29   SpO2 90%   Physical Exam Vitals and nursing note reviewed.  Constitutional:      General: He is not in acute distress.    Appearance: He is well-developed. He is not ill-appearing or diaphoretic.  HENT:     Head: Normocephalic and atraumatic.     Right Ear: External ear normal.     Left Ear: External ear normal.     Nose: Nose normal.  Eyes:     General:        Right eye: No discharge.        Left eye: No discharge.     Extraocular Movements: Extraocular movements intact.     Pupils: Pupils  are equal,  round, and reactive to light.  Neck:     Vascular: No JVD.  Cardiovascular:     Rate and Rhythm: Normal rate and regular rhythm.     Heart sounds: Normal heart sounds.  Pulmonary:     Effort: Pulmonary effort is normal.     Breath sounds: Normal breath sounds.  Abdominal:     General: There is no distension.     Palpations: Abdomen is soft.     Tenderness: There is no abdominal tenderness.  Musculoskeletal:     Cervical back: Neck supple.  Skin:    General: Skin is warm and dry.  Neurological:     Mental Status: He is alert.     Comments: CN 3-12 grossly intact. 5/5 strength in all 4 extremities. Grossly normal sensation. Normal finger to nose.   Psychiatric:        Mood and Affect: Mood is not anxious.     ED Results / Procedures / Treatments   Labs (all labs ordered are listed, but only abnormal results are displayed) Labs Reviewed  COMPREHENSIVE METABOLIC PANEL - Abnormal; Notable for the following components:      Result Value   Sodium 130 (*)    Chloride 92 (*)    CO2 20 (*)    Glucose, Bld 320 (*)    BUN 44 (*)    Creatinine, Ser 2.23 (*)    GFR calc non Af Amer 32 (*)    GFR calc Af Amer 37 (*)    Anion gap 18 (*)    All other components within normal limits  LACTIC ACID, PLASMA - Abnormal; Notable for the following components:   Lactic Acid, Venous 2.5 (*)    All other components within normal limits  CBC WITH DIFFERENTIAL/PLATELET - Abnormal; Notable for the following components:   Lymphs Abs 0.6 (*)    Abs Immature Granulocytes 0.09 (*)    All other components within normal limits  CBG MONITORING, ED - Abnormal; Notable for the following components:   Glucose-Capillary 338 (*)    All other components within normal limits  CBG MONITORING, ED - Abnormal; Notable for the following components:   Glucose-Capillary 340 (*)    All other components within normal limits  I-STAT VENOUS BLOOD GAS, ED - Abnormal; Notable for the following components:    pH, Ven 7.461 (*)    pCO2, Ven 36.0 (*)    pO2, Ven 136.0 (*)    Sodium 133 (*)    Calcium, Ion 1.10 (*)    All other components within normal limits  CULTURE, BLOOD (ROUTINE X 2)  CULTURE, BLOOD (ROUTINE X 2)  SARS CORONAVIRUS 2 BY RT PCR (HOSPITAL ORDER, [facility])  PROTIME-INR  LACTIC ACID, PLASMA  URINALYSIS, ROUTINE W REFLEX MICROSCOPIC  BETA-HYDROXYBUTYRIC ACID  BASIC METABOLIC PANEL    EKG EKG Interpretation  Date/Time:  Tuesday July 29 2019 15:34:43 EDT Ventricular Rate:  78 PR Interval:    QRS Duration: 102 QT Interval:  404 QTC Calculation: 461 R Axis:   130 Text Interpretation: Sinus rhythm Probable right ventricular hypertrophy similar to earlier in the day and Mar 2021 Confirmed by Sherwood Gambler [phone]) on 07/29/2019 3:37:19 PM   Radiology DG Chest Portable 1 View  Result Date: 07/29/2019 CLINICAL DATA:  Syncope, weakness EXAM: PORTABLE CHEST 1 VIEW COMPARISON:  04/05/2019 FINDINGS: Single frontal view of the chest demonstrates stable postsurgical changes from median sternotomy. Abandoned lead from previous AICD unchanged in position. Cardiac silhouette  is unremarkable. No airspace disease, effusion, or pneumothorax. IMPRESSION: 1. Stable exam, no acute process. Electronically Signed   By: Randa Ngo M.D.   On: 07/29/2019 16:18    Procedures Procedures (including critical care time)  Medications Ordered in ED Medications  lactated ringers bolus 1,000 mL (0 mLs Intravenous Stopped 07/29/19 1655)  lactated ringers bolus 1,000 mL (1,000 mLs Intravenous New Bag/Given 07/29/19 1704)    ED Course  I have reviewed the triage vital signs and the nursing notes.  Pertinent labs & imaging results that were available during my care of the patient were reviewed by me and considered in my medical decision making (see chart for details).    MDM Rules/Calculators/A&P                          Patient has soft BPs, though this is  improving with IVF. He otherwise has no infectious symptoms. He does have mild lactic elevation, which likely is dehydration rather than sepsis. UA and Beta-hydroxybutyrate is currently pending.  pH is normal on blood gas and so I think DKA is less likely but he could still have a mild version of this.  He will need continued fluids and glucose management.  Discussed with Dr. Deretha Emory, who will admit.  Final Clinical Impression(s) / ED Diagnoses Final diagnoses:  Acute kidney injury [facility])    Rx / DC Orders ED Discharge Orders    None       Sherwood Gambler, MD 07/29/19 1725

## 2019-07-29 NOTE — ED Notes (Signed)
Patient transported to Ultrasound 

## 2019-07-29 NOTE — Progress Notes (Signed)
Presents today for an office visit found to be hypotensive with blood pressure of 85/62, blood sugar of 345, A1c of 14.3.  States he does not feel well and has felt this way for the last 2 days.  On one occasion he stood up and subsequently fell to the ground.  He was driven here by a friend of his.  Gen: He is awake, alert, oriented x3 Does not appear diaphoretic, communicating with me normally. In the light of his history of cardiac and renal transplant I am referring him to the ED stat for further evaluation.  I will see him back for management of his chronic medical conditions once he gets discharged in 2 weeks.

## 2019-07-29 NOTE — Progress Notes (Signed)
CBG-345  A1C-14.3

## 2019-07-29 NOTE — ED Triage Notes (Addendum)
Pt reports generalized weakness for the past day or so. Denies any pain, dizziness, nausea or sob. Hx of heart and kidney transplant in Dec 2020. Hypotensive in triage

## 2019-07-30 ENCOUNTER — Encounter (HOSPITAL_COMMUNITY): Payer: Self-pay | Admitting: Internal Medicine

## 2019-07-30 LAB — GLUCOSE, CAPILLARY
Glucose-Capillary: 287 mg/dL — ABNORMAL HIGH (ref 70–99)
Glucose-Capillary: 62 mg/dL — ABNORMAL LOW (ref 70–99)
Glucose-Capillary: 79 mg/dL (ref 70–99)

## 2019-07-30 LAB — BASIC METABOLIC PANEL
Anion gap: 11 (ref 5–15)
Anion gap: 12 (ref 5–15)
Anion gap: 14 (ref 5–15)
BUN: 25 mg/dL — ABNORMAL HIGH (ref 6–20)
BUN: 30 mg/dL — ABNORMAL HIGH (ref 6–20)
BUN: 31 mg/dL — ABNORMAL HIGH (ref 6–20)
CO2: 21 mmol/L — ABNORMAL LOW (ref 22–32)
CO2: 22 mmol/L (ref 22–32)
CO2: 25 mmol/L (ref 22–32)
Calcium: 9 mg/dL (ref 8.9–10.3)
Calcium: 9.1 mg/dL (ref 8.9–10.3)
Calcium: 9.2 mg/dL (ref 8.9–10.3)
Chloride: 100 mmol/L (ref 98–111)
Chloride: 100 mmol/L (ref 98–111)
Chloride: 101 mmol/L (ref 98–111)
Creatinine, Ser: 1.29 mg/dL — ABNORMAL HIGH (ref 0.61–1.24)
Creatinine, Ser: 1.43 mg/dL — ABNORMAL HIGH (ref 0.61–1.24)
Creatinine, Ser: 1.57 mg/dL — ABNORMAL HIGH (ref 0.61–1.24)
GFR calc Af Amer: 57 mL/min — ABNORMAL LOW (ref >60)
GFR calc Af Amer: >60 mL/min (ref >60)
GFR calc Af Amer: >60 mL/min (ref >60)
GFR calc non Af Amer: 49 mL/min — ABNORMAL LOW (ref >60)
GFR calc non Af Amer: 55 mL/min — ABNORMAL LOW (ref >60)
GFR calc non Af Amer: >60 mL/min (ref >60)
Glucose, Bld: 102 mg/dL — ABNORMAL HIGH (ref 70–99)
Glucose, Bld: 294 mg/dL — ABNORMAL HIGH (ref 70–99)
Glucose, Bld: 98 mg/dL (ref 70–99)
Potassium: 3.3 mmol/L — ABNORMAL LOW (ref 3.5–5.1)
Potassium: 3.3 mmol/L — ABNORMAL LOW (ref 3.5–5.1)
Potassium: 4.1 mmol/L (ref 3.5–5.1)
Sodium: 133 mmol/L — ABNORMAL LOW (ref 135–145)
Sodium: 136 mmol/L (ref 135–145)
Sodium: 137 mmol/L (ref 135–145)

## 2019-07-30 LAB — CBG MONITORING, ED
Glucose-Capillary: 122 mg/dL — ABNORMAL HIGH (ref 70–99)
Glucose-Capillary: 128 mg/dL — ABNORMAL HIGH (ref 70–99)
Glucose-Capillary: 162 mg/dL — ABNORMAL HIGH (ref 70–99)
Glucose-Capillary: 224 mg/dL — ABNORMAL HIGH (ref 70–99)
Glucose-Capillary: 296 mg/dL — ABNORMAL HIGH (ref 70–99)
Glucose-Capillary: 77 mg/dL (ref 70–99)
Glucose-Capillary: 95 mg/dL (ref 70–99)

## 2019-07-30 LAB — BETA-HYDROXYBUTYRIC ACID: Beta-Hydroxybutyric Acid: 0.99 mmol/L — ABNORMAL HIGH (ref 0.05–0.27)

## 2019-07-30 LAB — HEMOGLOBIN A1C
Hgb A1c MFr Bld: 13.7 % — ABNORMAL HIGH (ref 4.8–5.6)
Mean Plasma Glucose: 346.49 mg/dL

## 2019-07-30 LAB — MRSA PCR SCREENING: MRSA by PCR: NEGATIVE

## 2019-07-30 MED ORDER — SODIUM CHLORIDE 0.9 % IV SOLN: INTRAVENOUS | Status: DC

## 2019-07-30 MED ORDER — INSULIN ASPART 100 UNIT/ML ~~LOC~~ SOLN: 0.0000 [IU] | Freq: Every day | SUBCUTANEOUS | Status: DC

## 2019-07-30 MED ORDER — INSULIN ASPART 100 UNIT/ML ~~LOC~~ SOLN
3.0000 [IU] | Freq: Three times a day (TID) | SUBCUTANEOUS | Status: DC
  Administered 2019-07-30 – 2019-07-31 (×2): 3 [IU] via SUBCUTANEOUS

## 2019-07-30 MED ORDER — POTASSIUM CHLORIDE CRYS ER 20 MEQ PO TBCR
20.0000 meq | EXTENDED_RELEASE_TABLET | Freq: Once | ORAL | Status: AC
  Administered 2019-07-30: 20 meq via ORAL
  Filled 2019-07-30: qty 1

## 2019-07-30 MED ORDER — INSULIN GLARGINE 100 UNIT/ML ~~LOC~~ SOLN
10.0000 [IU] | Freq: Every day | SUBCUTANEOUS | Status: DC
  Administered 2019-07-30: 10 [IU] via SUBCUTANEOUS
  Filled 2019-07-30 (×4): qty 0.1

## 2019-07-30 MED ORDER — INSULIN ASPART 100 UNIT/ML ~~LOC~~ SOLN
0.0000 [IU] | Freq: Three times a day (TID) | SUBCUTANEOUS | Status: DC
  Administered 2019-07-30: 11 [IU] via SUBCUTANEOUS
  Administered 2019-07-31: 7 [IU] via SUBCUTANEOUS

## 2019-07-30 MED ORDER — INSULIN ASPART 100 UNIT/ML ~~LOC~~ SOLN
0.0000 [IU] | Freq: Three times a day (TID) | SUBCUTANEOUS | Status: DC
  Administered 2019-07-30: 8 [IU] via SUBCUTANEOUS

## 2019-07-30 NOTE — ED Notes (Signed)
Breakfast Ordered--Marvin Martin  

## 2019-07-30 NOTE — ED Notes (Signed)
Report given to floor RN

## 2019-07-30 NOTE — Progress Notes (Signed)
MD notified of pts CBG result and verbal order given to RN to hold scheduled dose of 10 units insulin glargine tonight. Will continue to monitor.

## 2019-07-30 NOTE — ED Notes (Signed)
This RN paged the On-Call Provider regarding this pts most recent CBG and blood results. Awaiting further orders.

## 2019-07-30 NOTE — Progress Notes (Signed)
Inpatient Diabetes Program Recommendations  AACE/ADA: New Consensus Statement on Inpatient Glycemic Control  Target Ranges:  Prepandial:   less than 140 mg/dL      Peak postprandial:   less than 180 mg/dL (1-2 hours)      Critically ill patients:  140 - 180 mg/dL  Results for Marvin Martin, Marvin Martin (MRN [id number]) as of 07/30/2019 14:53  Ref. Range 07/30/2019 00:34 07/30/2019 02:15 07/30/2019 05:27 07/30/2019 06:50 07/30/2019 08:04 07/30/2019 09:15 07/30/2019 12:38  Glucose-Capillary Latest Ref Range: 70 - 99 mg/dL 128 (H) 162 (H) 122 (H) 95 77 224 (H) 296 (H)   Results for Marvin Martin, Marvin Martin (MRN [id number]) as of 07/30/2019 14:53  Ref. Range 07/29/2019 15:28 07/29/2019 15:45 07/29/2019 18:06 07/29/2019 19:28 07/29/2019 21:04 07/29/2019 21:53 07/29/2019 23:12  Glucose-Capillary Latest Ref Range: 70 - 99 mg/dL 338 (H) 340 (H) 242 (H) 222 (H) >600 (HH) 102 (H) 87   Review of Glycemic Control  Diabetes history: DM2 Outpatient Diabetes medications: Lantus 15 units QHS, Novolog 3 units TID with meals plus takes additional units for hyperglycemia Current orders for Inpatient glycemic control: Lantus 10 units QHS, Novolog 0-15 units TID with meals  Inpatient Diabetes Program Recommendations:    Insulin-Correction: Please consider ordering Novolog 0-5 units QHS.  Insulin-Meal Coverage: Please consider ordering Novolog 3 units TID with meals for meal coverage if patient eats at least 50% of meals.  NOTE: Spoke with patient regarding DM control and outpatient DM medications. Patient confirms that he was dx with DM in March 2021. Patient states that he had a heart and kidney transplant in December 2020 and he has been on steroids since then. Patient is currently taking Prednisone 7.5 mg daily. Patient reports that he has been taking Lantus 15 units QHS and Novolog 3 units TID with meals plus additional units for hyperglycemia. Patient states that glucose has ranged from 100's-300's mg/dl usually but notes that his glucose  was "HI" on his glucometer before he came to the hospital. Patient is concerned about hyperglycemia as he does not want to damage his transplanted heart or kidney. Patient states that he has not experienced hypoglycemia very much since started insulin. Patient notes that he was symptomatic when he did experience hypoglycemia. Discussed current insulin orders and informed patient it would be requested that MD order meal coverage insulin but it would be up to the provider to order if agreeable. Informed patient that diabetes coordinator will follow while inpatient. Will continue to follow along while inpatient.  Thanks, Barnie Alderman, RN, MSN, CDE Diabetes Coordinator Inpatient Diabetes Program [phone] (Team Pager from 8am to 5pm)

## 2019-07-30 NOTE — Progress Notes (Signed)
PROGRESS NOTE    Marvin Martin  FUX:[id number] DOB: [birth year] DOA: 07/29/2019 PCP: Charlott Rakes, MD  Brief Narrative: HPI per Dr. Doristine Bosworth  07/29/2019   Marvin Martin is a 54 y.o. male with medical history significant of ischemic cardiomyopathy status post heart transplant and kidney transplant performed at [facility] in 01/05/2019-on chronic immunosuppressive therapy (prednisone/tacrolimus/mycophenolate), insulin-dependent diabetes mellitus, hypertension, hyperlipidemia, CKD stage III, GERD, chronic constipation presents to emergency department for evaluation of near syncope and elevated blood sugar.  Patient was seen by his PCP and was found to be hypotensive with blood pressure of 85/62, blood sugar of 345 and A1c of 14.3 therefore patient sent to emergency department for further evaluation and management.  Patient tells me that he was lightheaded last night fell to the ground however denies loss of consciousness, seizure, head trauma, nausea, vomiting, chest pain, shortness of breath, cough, congestion, fever, chills, abdominal pain, urinary or bowel changes.  He tells me that he uses regular insulin 4 units 3 times daily and Lantus of 15 units at bedtime.  He reports that he missed couple of doses however he is trying to be compliant with his insulin regime.  He tells me that he is very compliant with diet and drink a lot of water, however does not exercise regularly.  Of note: This is patient's second admission in last 3 months with similar symptoms.  He lives with his wife at home.  No history of smoking, alcohol, listed drug use.  ED Course: Upon arrival to ED: Patient's blood pressure was noted to be 85/67, afebrile with no leukocytosis, blood glucose, 345, A1c: 14.3, sodium of 130, CO2: 20, CMP shows worsening kidney function, anion gap of 18, lactic acid: 2.5, pH: 7.4, CBC, INR, blood culture, COVID-19, beta hydroxybutyric acid: Pending.  Patient received IV fluid bolus x2.   [facility] hospitalist consulted for admission for worsening kidney function and possibly DKA.  Assessment & Plan:   Principal Problem:   DKA (diabetic ketoacidoses) ([place]) Active Problems:   Benign essential HTN   Cardiomyopathy, ischemic   Hyperlipidemia   Acute on chronic kidney failure (H[facility])   H/O heart transplant ([place])   DM (diabetes mellitus), secondary uncontrolled ([place])   Hyponatremia   Hypotension   #1 DKA likely secondary to missed doses of insulin.  He was treated with IV insulin and transition to Marvin-acting insulin. CBG (last 3)  Recent Labs    07/30/19 0804 07/30/19 0915 07/30/19 1238  GLUCAP 77 224* 296*   Continue Levemir 10 units nightly and sliding scale insulin.  Continue normal saline.  #2 AKI on CKD stage III status post renal transplant continue immunosuppressants.  #3 pseudohyponatremia improved  #4 hypotension patient lightheaded and orthostatic continue normal saline.  #5 status post near syncope due to orthostatic hypotension and DKA.  Continue normal saline.   #6 GERD on PPI  #7 gout on allopurinol  #8 status post heart and renal transplant on prednisone tacrolimus and mycophenolate    Estimated body mass index is 21.29 kg/m as calculated from the following:   Height as of this encounter: 5' 7.5" (1.715 m).   Weight as of this encounter: 62.6 kg.  DVT prophylaxis: Lovenox Code Status full code Family Communication: None at bedside  disposition Plan:  Status is: Inpatient patient admitted with DKA still on IV fluids orthostatic hypotension Dispo: The patient is from: Home              Anticipated d/c is to: Home  Anticipated d/c date is: 1 day              Patient currently is not medically stable to d/c.   Consultants:   None  Procedures: None Antimicrobials: None Subjective: Patient is resting in bed He is asking for food denies any nausea vomiting  Objective: Vitals:   07/30/19 0245 07/30/19 0300 07/30/19 0600  07/30/19 1109  BP: 105/60 103/61  108/83  Pulse: 79 78  80  Resp: 18 18  (!) 22  Temp:      TempSrc:      SpO2: 100% 100%  100%  Weight:   62.6 kg   Height:   5' 7.5" (1.715 m)    No intake or output data in the 24 hours ending 07/30/19 1310 Filed Weights   07/30/19 0600  Weight: 62.6 kg    Examination:  General exam: Appears calm and comfortable  Respiratory system: Clear to auscultation. Respiratory effort normal. Cardiovascular system: S1 & S2 heard, RRR. No JVD, murmurs, rubs, gallops or clicks. No pedal edema. Gastrointestinal system: Abdomen is nondistended, soft and nontender. No organomegaly or masses felt. Normal bowel sounds heard. Central nervous system: Alert and oriented. No focal neurological deficits. Extremities: Symmetric 5 x 5 power. Skin: No rashes, lesions or ulcers Psychiatry: Judgement and insight appear normal. Mood & affect appropriate.     Data Reviewed: I have personally reviewed following labs and imaging studies  CBC: Recent Labs  Lab 07/29/19 1450 07/29/19 1643  WBC 7.8  --   NEUTROABS 6.6  --   HGB 16.3 17.0  HCT 47.0 50.0  MCV 92.2  --   PLT 242  --    Basic Metabolic Panel: Recent Labs  Lab 07/29/19 1450 07/29/19 1643 07/29/19 2318 07/29/19 2340  NA 130* 133* 136 137  K 3.6 4.0 3.3* 3.3*  CL 92*  --  100 101  CO2 20*  --  25 22  GLUCOSE 320*  --  98 102*  BUN 44*  --  30* 31*  CREATININE 2.23*  --  1.43* 1.29*  CALCIUM 10.0  --  9.0 9.1   GFR: Estimated Creatinine Clearance: 58 mL/min (A) (by C-G formula based on SCr of 1.29 mg/dL (H)). Liver Function Tests: Recent Labs  Lab 07/29/19 1450  AST 28  ALT 23  ALKPHOS 98  BILITOT 1.1  PROT 7.3  ALBUMIN 3.7   No results for input(s): LIPASE, AMYLASE in the last 168 hours. No results for input(s): AMMONIA in the last 168 hours. Coagulation Profile: Recent Labs  Lab 07/29/19 1450  INR 1.1   Cardiac Enzymes: No results for input(s): CKTOTAL, CKMB, CKMBINDEX,  TROPONINI in the last 168 hours. BNP (last 3 results) No results for input(s): PROBNP in the last 8760 hours. HbA1C: Recent Labs    07/29/19 1438  HGBA1C 14.3*   CBG: Recent Labs  Lab 07/30/19 0527 07/30/19 0650 07/30/19 0804 07/30/19 0915 07/30/19 1238  GLUCAP 122* 95 77 224* 296*   Lipid Profile: No results for input(s): CHOL, HDL, LDLCALC, TRIG, CHOLHDL, LDLDIRECT in the last 72 hours. Thyroid Function Tests: No results for input(s): TSH, T4TOTAL, FREET4, T3FREE, THYROIDAB in the last 72 hours. Anemia Panel: No results for input(s): VITAMINB12, FOLATE, FERRITIN, TIBC, IRON, RETICCTPCT in the last 72 hours. Sepsis Labs: Recent Labs  Lab 07/29/19 1451 07/29/19 1714  LATICACIDVEN 2.5* 2.3*    Recent Results (from the past 240 hour(s))  Culture, blood (Routine x 2)     Status: None (  Preliminary result)   Collection Time: 07/29/19  2:51 PM   Specimen: BLOOD  Result Value Ref Range Status   Specimen Description BLOOD RIGHT ANTECUBITAL  Final   Special Requests   Final    BOTTLES DRAWN AEROBIC AND ANAEROBIC Blood Culture results may not be optimal due to an inadequate volume of blood received in culture bottles   Culture   Final    NO GROWTH < 24 HOURS Performed at [facility], [address] [place], [place] [address]    Report Status PENDING  Incomplete  Culture, blood (Routine x 2)     Status: None (Preliminary result)   Collection Time: 07/29/19  3:35 PM   Specimen: BLOOD LEFT HAND  Result Value Ref Range Status   Specimen Description BLOOD LEFT HAND  Final   Special Requests   Final    BOTTLES DRAWN AEROBIC AND ANAEROBIC Blood Culture results may not be optimal due to an inadequate volume of blood received in culture bottles   Culture   Final    NO GROWTH < 24 HOURS Performed at [facility], [place] [address], [place], [place] [address]    Report Status PENDING  Incomplete  SARS Coronavirus 2 by RT PCR (hospital order, performed in [facility]  hospital lab) Nasopharyngeal Nasopharyngeal Swab     Status: None   Collection Time: 07/29/19  5:04 PM   Specimen: Nasopharyngeal Swab  Result Value Ref Range Status   SARS Coronavirus 2 NEGATIVE NEGATIVE Final    Comment: (NOTE) SARS-CoV-2 target nucleic acids are NOT DETECTED.  The SARS-CoV-2 RNA is generally detectable in upper and lower respiratory specimens during the acute phase of infection. The lowest concentration of SARS-CoV-2 viral copies this assay can detect is 250 copies / mL. A negative result does not preclude SARS-CoV-2 infection and should not be used as the sole basis for treatment or other patient management decisions.  A negative result may occur with improper specimen collection / handling, submission of specimen other than nasopharyngeal swab, presence of viral mutation(s) within the areas targeted by this assay, and inadequate number of viral copies (<250 copies / mL). A negative result must be combined with clinical observations, patient history, and epidemiological information.  Fact Sheet for Patients:   StrictlyIdeas.no  Fact Sheet for Healthcare Providers: BankingDealers.co.za  This test is not yet approved or  cleared by the [place] FDA and has been authorized for detection and/or diagnosis of SARS-CoV-2 by FDA under an Emergency Use Authorization (EUA).  This EUA will remain in effect (meaning this test can be used) for the duration of the COVID-19 declaration under Section 564(b)(1) of the Act, 21 U.S.C. section 360bbb-3(b)(1), unless the authorization is terminated or revoked sooner.  Performed at [facility], [place] [address], [place], [place] [address]          Radiology Studies: DG Chest Portable 1 View  Result Date: 07/29/2019 CLINICAL DATA:  Syncope, weakness EXAM: PORTABLE CHEST 1 VIEW COMPARISON:  04/05/2019 FINDINGS: Single frontal view of the chest demonstrates stable  postsurgical changes from median sternotomy. Abandoned lead from previous AICD unchanged in position. Cardiac silhouette is unremarkable. No airspace disease, effusion, or pneumothorax. IMPRESSION: 1. Stable exam, no acute process. Electronically Signed   By: Randa Ngo M.D.   On: 07/29/2019 16:18        Scheduled Meds: . enoxaparin (LOVENOX) injection  40 mg Subcutaneous Q24H  . insulin aspart  0-15 Units Subcutaneous TID WC  .  insulin glargine  10 Units Subcutaneous QHS   Continuous Infusions: . sodium chloride Stopped (07/29/19 1936)  . dextrose 5 % and 0.45% NaCl 75 mL/hr at 07/29/19 1936  . insulin Stopped (07/30/19 0706)     LOS: 1 day     Georgette Shell, MD  07/30/2019, 1:10 PM

## 2019-07-30 NOTE — Progress Notes (Signed)
Pts CBG was 69 upon assessment. Pt given juice and graham crackers. CBG now 79. Will continue to monitor.

## 2019-07-30 NOTE — Progress Notes (Signed)
Responded to patient request for a chaplain. Visited with patient and provided emotional and spiritual support after having conversation with patient.  Will follow as needed.  Jaclynn Major, [place], [facility], Pager [phone]

## 2019-07-31 DIAGNOSIS — N179 Acute kidney failure, unspecified: Secondary | ICD-10-CM

## 2019-07-31 LAB — BASIC METABOLIC PANEL
Anion gap: 12 (ref 5–15)
BUN: 22 mg/dL — ABNORMAL HIGH (ref 6–20)
CO2: 23 mmol/L (ref 22–32)
Calcium: 8.8 mg/dL — ABNORMAL LOW (ref 8.9–10.3)
Chloride: 102 mmol/L (ref 98–111)
Creatinine, Ser: 1.24 mg/dL (ref 0.61–1.24)
GFR calc Af Amer: >60 mL/min (ref >60)
GFR calc non Af Amer: >60 mL/min (ref >60)
Glucose, Bld: 209 mg/dL — ABNORMAL HIGH (ref 70–99)
Potassium: 4.2 mmol/L (ref 3.5–5.1)
Sodium: 137 mmol/L (ref 135–145)

## 2019-07-31 LAB — GLUCOSE, CAPILLARY: Glucose-Capillary: 218 mg/dL — ABNORMAL HIGH (ref 70–99)

## 2019-07-31 MED ORDER — FREESTYLE LIBRE 2 READER DEVI: 3.0000 [IU] | Freq: Three times a day (TID) | 4 refills | Status: DC

## 2019-07-31 MED ORDER — FREESTYLE LIBRE 2 SENSOR MISC: 3.0000 [IU] | Freq: Three times a day (TID) | 4 refills | Status: DC

## 2019-07-31 NOTE — Plan of Care (Signed)
  Problem: Education: Goal: Ability to describe self-care measures that may prevent or decrease complications (Diabetes Survival Skills Education) will improve Outcome: Progressing Goal: Individualized Educational Video(s) Outcome: Progressing   Problem: Cardiac: Goal: Ability to maintain an adequate cardiac output will improve Outcome: Progressing   Problem: Health Behavior/Discharge Planning: Goal: Ability to identify and utilize available resources and services will improve Outcome: Progressing Goal: Ability to manage health-related needs will improve Outcome: Progressing   Problem: Fluid Volume: Goal: Ability to achieve a balanced intake and output will improve Outcome: Progressing   Problem: Metabolic: Goal: Ability to maintain appropriate glucose levels will improve Outcome: Progressing   Problem: Nutritional: Goal: Maintenance of adequate nutrition will improve Outcome: Progressing Goal: Maintenance of adequate weight for body size and type will improve Outcome: Progressing   Problem: Respiratory: Goal: Will regain and/or maintain adequate ventilation Outcome: Progressing   Problem: Urinary Elimination: Goal: Ability to achieve and maintain adequate renal perfusion and functioning will improve Outcome: Progressing   Problem: Education: Goal: Knowledge of General Education information will improve Description: Including pain rating scale, medication(s)/side effects and non-pharmacologic comfort measures Outcome: Progressing   Problem: Clinical Measurements: Goal: Ability to maintain clinical measurements within normal limits will improve Outcome: Progressing Goal: Will remain free from infection Outcome: Progressing Goal: Diagnostic test results will improve Outcome: Progressing Goal: Respiratory complications will improve Outcome: Progressing Goal: Cardiovascular complication will be avoided Outcome: Progressing   Problem: Activity: Goal: Risk for  activity intolerance will decrease Outcome: Progressing   Problem: Coping: Goal: Level of anxiety will decrease Outcome: Progressing   Problem: Pain Managment: Goal: General experience of comfort will improve Outcome: Progressing   Problem: Safety: Goal: Ability to remain free from injury will improve Outcome: Progressing   Problem: Skin Integrity: Goal: Risk for impaired skin integrity will decrease Outcome: Progressing

## 2019-07-31 NOTE — Discharge Summary (Signed)
Physician Discharge Summary  Marvin Martin OXB:[id number] DOB: 02-25-1965 DOA: 07/29/2019  PCP: Charlott Rakes, MD  Admit date: 07/29/2019 Discharge date: 07/31/2019  Admitted From: Home Disposition: Home Recommendations for Outpatient Follow-up:  1. Follow up with PCP in 1-2 weeks 2. Please obtain BMP/CBC in one week 3. Please follow up on the following pending results:  Home Health none Equipment/Devices none  Discharge Condition: Stable CODE STATUS: Full code Diet recommendation: Carb modified cardiac diet Brief/Interim Summary:Marvin Covingtonis a 54 y.o.malewith medical history significant ofischemic cardiomyopathy status post heart transplant and kidney transplant performed at [facility] in 01/05/2019-on chronic immunosuppressive therapy(prednisone/tacrolimus/mycophenolate), insulin-dependent diabetes mellitus, hypertension, hyperlipidemia, CKD stage III, GERD, chronic constipation presents to emergency department for evaluation of near syncope and elevated blood sugar.  Patient was seen by his PCP and was found to be hypotensive with blood pressure of 85/62, blood sugar of 345 and A1c of 14.3 therefore patient sent to emergency department for further evaluation and management.  Patient tells me that he was lightheaded last night fell to the ground however denies loss of consciousness, seizure, head trauma, nausea, vomiting, chest pain, shortness of breath, cough, congestion, fever, chills, abdominal pain, urinary or bowel changes.  He tells me that he uses regular insulin 4 units 3 times daily and Lantus of 15 units at bedtime. He reports that he missed couple of doses however he is trying to be compliant with his insulin regime. He tells me that he is very compliant with diet and drink a lot of water, however does not exercise regularly.  Of note: This is patient's second admission in last 3 months with similar symptoms.  He lives with his wife at home. No history of  smoking, alcohol, listed drug use.  ED Course:Upon arrival to ED: Patient's blood pressure was noted to be 85/67, afebrile with no leukocytosis, blood glucose, 345, A1c: 14.3, sodium of 130, CO2: 20, CMP shows worsening kidney function, anion gap of 18, lactic acid: 2.5, pH: 7.4, CBC, INR, blood culture, COVID-19, beta hydroxybutyric acid: Pending. Patient received IV fluid bolus x2. [facility] consulted for admission for worsening kidney function and possibly DKA.   Discharge Diagnoses:  Principal Problem:   DKA (diabetic ketoacidoses) ([place]) Active Problems:   Benign essential HTN   Cardiomyopathy, ischemic   Hyperlipidemia   Acute on chronic kidney failure (HCC)   H/O heart transplant ([place])   DM (diabetes mellitus), secondary uncontrolled ([place])   Hyponatremia   Hypotension   #1 DKA likely secondary to missed doses of insulin.  He was treated with IV insulin and transition to long-acting insulin.continue same dose of insulin as prior to admit.   #2 AKI on CKD stage III status post renal transplant continue immunosuppressants.  #3 pseudohyponatremia resolved na 137.  #4  Orthostatic hypotension treated with Normal saline.holding norvasc and imdur on dc please follow up with pcp   #5 status post near syncope due to orthostatic hypotension and DKA  #6 GERD on PPI  #7 gout on allopurinol  #8 status post heart and renal transplant on prednisone tacrolimus and mycophenolate   Estimated body mass index is 21.16 kg/m as calculated from the following:   Height as of this encounter: 5' 7.5" (1.715 m).   Weight as of this encounter: 62.2 kg.  Discharge Instructions  Discharge Instructions    Diet - low sodium heart healthy   Complete by: As directed    Increase activity slowly   Complete by: As directed      Allergies  as of 07/31/2019   No Known Allergies     Medication List    STOP taking these medications   amLODipine 10 MG tablet Commonly known as:  NORVASC   atovaquone 750 MG/5ML suspension Commonly known as: MEPRON   isosorbide mononitrate 30 MG 24 hr tablet Commonly known as: IMDUR   oxyCODONE 5 MG immediate release tablet Commonly known as: Oxy IR/ROXICODONE   rosuvastatin 40 MG tablet Commonly known as: CRESTOR   senna-docusate 8.6-50 MG tablet Commonly known as: Senokot-S   torsemide 10 MG tablet Commonly known as: DEMADEX     TAKE these medications   aspirin 81 MG tablet Take 81 mg by mouth daily.   Calcium Carbonate-Vitamin D 600-400 MG-UNIT tablet Take 1 tablet by mouth daily.   carvedilol 12.5 MG tablet Commonly known as: COREG Take 12.5 mg by mouth 2 (two) times daily.   cholecalciferol 1000 units tablet Commonly known as: VITAMIN D Take 1,000 Units by mouth daily.   doxazosin 2 MG tablet Commonly known as: CARDURA Take 2 mg by mouth at bedtime.   ezetimibe 10 MG tablet Commonly known as: ZETIA Take 1 tablet (10 mg total) by mouth daily. Needs appt for further refills What changed: additional instructions   HYDROcodone-acetaminophen 5-325 MG tablet Commonly known as: NORCO/VICODIN Take 1 tablet by mouth every 4 (four) hours as needed.   Insulin Pen Needle 32G X 4 MM Misc 1 application by Does not apply route 4 (four) times daily.   Lantus SoloStar 100 UNIT/ML Solostar Pen Generic drug: insulin glargine Inject 15 Units into the skin daily. What changed: when to take this   magnesium oxide 400 (241.3 Mg) MG tablet Commonly known as: MAG-OX Take 400 mg by mouth 2 (two) times daily.   mycophenolate 500 MG tablet Commonly known as: CELLCEPT Take 1,500 mg by mouth in the morning and at bedtime.   nitroGLYCERIN 0.4 MG SL tablet Commonly known as: NITROSTAT Place 1 tablet (0.4 mg total) under the tongue every 5 (five) minutes as needed for chest pain. Reported on 04/12/2015   NovoLOG FlexPen 100 UNIT/ML FlexPen Generic drug: insulin aspart Inject 3 Units into the skin 3 (three) times daily  with meals. And per sliding scale QID What changed:   when to take this  additional instructions   nystatin 100000 UNIT/ML suspension Commonly known as: MYCOSTATIN Take 5 mLs by mouth See admin instructions. Swish and swallow 5 ml's by mouth four times a day   omega-3 acid ethyl esters 1 g capsule Commonly known as: LOVAZA Take 1 g by mouth daily.   ondansetron 4 MG tablet Commonly known as: ZOFRAN Take 1 tablet (4 mg total) every 8 (eight) hours as needed by mouth for nausea or vomiting. Reported on 05/24/2015   pantoprazole 40 MG tablet Commonly known as: PROTONIX Take 1 tablet by mouth at bedtime.   Praluent 75 MG/ML Soaj Generic drug: Alirocumab INJECT 75 MG INTO THE SKIN EVERY 14 (FOURTEEN) DAYS.   predniSONE 5 MG tablet Commonly known as: DELTASONE Take 1.5 tablets by mouth in the morning.   tacrolimus 1 MG capsule Commonly known as: PROGRAF Take 3-4 mg by mouth See admin instructions. Take 4 mg by mouth in the morning and 3 mg in the evening       Follow-up Information    Charlott Rakes, MD Follow up.   Specialty: Family Medicine Contact information: [address] [place] [address] [phone]  No Known Allergies  Consultations: none  Procedures/Studies: DG Chest Portable 1 View  Result Date: 07/29/2019 CLINICAL DATA:  Syncope, weakness EXAM: PORTABLE CHEST 1 VIEW COMPARISON:  04/05/2019 FINDINGS: Single frontal view of the chest demonstrates stable postsurgical changes from median sternotomy. Abandoned lead from previous AICD unchanged in position. Cardiac silhouette is unremarkable. No airspace disease, effusion, or pneumothorax. IMPRESSION: 1. Stable exam, no acute process. Electronically Signed   By: Randa Ngo M.D.   On: 07/29/2019 16:18    (Echo, Carotid, EGD, Colonoscopy, ERCP)    Subjective: No complaints anxious to go home  Discharge Exam: Vitals:   07/31/19 0439 07/31/19 0747  BP: 108/80 (!) 117/91   Pulse: 83 86  Resp: 15 20  Temp: 97.6 F (36.4 C) 97.7 F (36.5 C)  SpO2: 100% 100%   Vitals:   07/30/19 2346 07/31/19 0300 07/31/19 0439 07/31/19 0747  BP: 103/77  108/80 (!) 117/91  Pulse: 85 84 83 86  Resp: 18 20 15 20   Temp: 98.2 F (36.8 C)  97.6 F (36.4 C) 97.7 F (36.5 C)  TempSrc: Oral  Oral Oral  SpO2: 100% 99% 100% 100%  Weight:      Height:        General: Pt is alert, awake, not in acute distress Cardiovascular: RRR, S1/S2 +, no rubs, no gallops Respiratory: CTA bilaterally, no wheezing, no rhonchi Abdominal: Soft, NT, ND, bowel sounds + Extremities: no edema, no cyanosis    The results of significant diagnostics from this hospitalization (including imaging, microbiology, ancillary and laboratory) are listed below for reference.     Microbiology: Recent Results (from the past 240 hour(s))  Culture, blood (Routine x 2)     Status: None (Preliminary result)   Collection Time: 07/29/19  2:51 PM   Specimen: BLOOD  Result Value Ref Range Status   Specimen Description BLOOD RIGHT ANTECUBITAL  Final   Special Requests   Final    BOTTLES DRAWN AEROBIC AND ANAEROBIC Blood Culture results may not be optimal due to an inadequate volume of blood received in culture bottles   Culture   Final    NO GROWTH < 24 HOURS Performed at [facility], [address] [place], [place] [address]    Report Status PENDING  Incomplete  Culture, blood (Routine x 2)     Status: None (Preliminary result)   Collection Time: 07/29/19  3:35 PM   Specimen: BLOOD LEFT HAND  Result Value Ref Range Status   Specimen Description BLOOD LEFT HAND  Final   Special Requests   Final    BOTTLES DRAWN AEROBIC AND ANAEROBIC Blood Culture results may not be optimal due to an inadequate volume of blood received in culture bottles   Culture   Final    NO GROWTH < 24 HOURS Performed at [facility], [place] [address] [place], [place] [address]    Report Status PENDING  Incomplete   SARS Coronavirus 2 by RT PCR (hospital order, performed in [facility] hospital lab) Nasopharyngeal Nasopharyngeal Swab     Status: None   Collection Time: 07/29/19  5:04 PM   Specimen: Nasopharyngeal Swab  Result Value Ref Range Status   SARS Coronavirus 2 NEGATIVE NEGATIVE Final    Comment: (NOTE) SARS-CoV-2 target nucleic acids are NOT DETECTED.  The SARS-CoV-2 RNA is generally detectable in upper and lower respiratory specimens during the acute phase of infection. The lowest concentration of SARS-CoV-2 viral copies this assay can detect is 250 copies /  mL. A negative result does not preclude SARS-CoV-2 infection and should not be used as the sole basis for treatment or other patient management decisions.  A negative result may occur with improper specimen collection / handling, submission of specimen other than nasopharyngeal swab, presence of viral mutation(s) within the areas targeted by this assay, and inadequate number of viral copies (<250 copies / mL). A negative result must be combined with clinical observations, patient history, and epidemiological information.  Fact Sheet for Patients:   StrictlyIdeas.no  Fact Sheet for Healthcare Providers: BankingDealers.co.za  This test is not yet approved or  cleared by the [place] FDA and has been authorized for detection and/or diagnosis of SARS-CoV-2 by FDA under an Emergency Use Authorization (EUA).  This EUA will remain in effect (meaning this test can be used) for the duration of the COVID-19 declaration under Section 564(b)(1) of the Act, 21 U.S.C. section 360bbb-3(b)(1), unless the authorization is terminated or revoked sooner.  Performed at [facility], [place] [address], [place], [place] [address]   MRSA PCR Screening     Status: None   Collection Time: 07/30/19  2:39 PM   Specimen: Nasal Mucosa; Nasopharyngeal  Result Value Ref Range Status   MRSA by PCR  NEGATIVE NEGATIVE Final    Comment:        The GeneXpert MRSA Assay (FDA approved for NASAL specimens only), is one component of a comprehensive MRSA colonization surveillance program. It is not intended to diagnose MRSA infection nor to guide or monitor treatment for MRSA infections. Performed at [facility], [place] [address] [place], [place] [address]      Labs: BNP (last 3 results) No results for input(s): BNP in the last 8760 hours. Basic Metabolic Panel: Recent Labs  Lab 07/29/19 1450 07/29/19 1450 07/29/19 1643 07/29/19 2318 07/29/19 2340 07/30/19 1458 07/31/19 0223  NA 130*   < > 133* 136 137 133* 137  K 3.6   < > 4.0 3.3* 3.3* 4.1 4.2  CL 92*  --   --  100 101 100 102  CO2 20*  --   --  25 22 21* 23  GLUCOSE 320*  --   --  98 102* 294* 209*  BUN 44*  --   --  30* 31* 25* 22*  CREATININE 2.23*  --   --  1.43* 1.29* 1.57* 1.24  CALCIUM 10.0  --   --  9.0 9.1 9.2 8.8*   < > = values in this interval not displayed.   Liver Function Tests: Recent Labs  Lab 07/29/19 1450  AST 28  ALT 23  ALKPHOS 98  BILITOT 1.1  PROT 7.3  ALBUMIN 3.7   No results for input(s): LIPASE, AMYLASE in the last 168 hours. No results for input(s): AMMONIA in the last 168 hours. CBC: Recent Labs  Lab 07/29/19 1450 07/29/19 1643  WBC 7.8  --   NEUTROABS 6.6  --   HGB 16.3 17.0  HCT 47.0 50.0  MCV 92.2  --   PLT 242  --    Cardiac Enzymes: No results for input(s): CKTOTAL, CKMB, CKMBINDEX, TROPONINI in the last 168 hours. BNP: Invalid input(s): POCBNP CBG: Recent Labs  Lab 07/30/19 1238 07/30/19 1608 07/30/19 2106 07/30/19 2133 07/31/19 0611  GLUCAP 296* 287* 62* 79 218*   D-Dimer No results for input(s): DDIMER in the last 72 hours. Hgb A1c Recent Labs    07/29/19 1438 07/29/19 1450  HGBA1C 14.3* 13.7*  Lipid Profile No results for input(s): CHOL, HDL, LDLCALC, TRIG, CHOLHDL, LDLDIRECT in the last 72 hours. Thyroid function studies No results for  input(s): TSH, T4TOTAL, T3FREE, THYROIDAB in the last 72 hours.  Invalid input(s): FREET3 Anemia work up No results for input(s): VITAMINB12, FOLATE, FERRITIN, TIBC, IRON, RETICCTPCT in the last 72 hours. Urinalysis    Component Value Date/Time   COLORURINE YELLOW 07/29/2019 1933   APPEARANCEUR CLEAR 07/29/2019 1933   LABSPEC 1.017 07/29/2019 1933   PHURINE 5.0 07/29/2019 1933   GLUCOSEU >=500 (A) 07/29/2019 1933   HGBUR NEGATIVE 07/29/2019 [place] NEGATIVE 07/29/2019 1933   KETONESUR 20 (A) 07/29/2019 1933   PROTEINUR NEGATIVE 07/29/2019 1933   NITRITE NEGATIVE 07/29/2019 1933   LEUKOCYTESUR NEGATIVE 07/29/2019 1933   Sepsis Labs Invalid input(s): PROCALCITONIN,  WBC,  LACTICIDVEN Microbiology Recent Results (from the past 240 hour(s))  Culture, blood (Routine x 2)     Status: None (Preliminary result)   Collection Time: 07/29/19  2:51 PM   Specimen: BLOOD  Result Value Ref Range Status   Specimen Description BLOOD RIGHT ANTECUBITAL  Final   Special Requests   Final    BOTTLES DRAWN AEROBIC AND ANAEROBIC Blood Culture results may not be optimal due to an inadequate volume of blood received in culture bottles   Culture   Final    NO GROWTH < 24 HOURS Performed at [facility], [address] [place], [place] [address]    Report Status PENDING  Incomplete  Culture, blood (Routine x 2)     Status: None (Preliminary result)   Collection Time: 07/29/19  3:35 PM   Specimen: BLOOD LEFT HAND  Result Value Ref Range Status   Specimen Description BLOOD LEFT HAND  Final   Special Requests   Final    BOTTLES DRAWN AEROBIC AND ANAEROBIC Blood Culture results may not be optimal due to an inadequate volume of blood received in culture bottles   Culture   Final    NO GROWTH < 24 HOURS Performed at [facility], [place] [address] [place], [place] [address]    Report Status PENDING  Incomplete  SARS Coronavirus 2 by RT PCR (hospital order, performed in [facility]  hospital lab) Nasopharyngeal Nasopharyngeal Swab     Status: None   Collection Time: 07/29/19  5:04 PM   Specimen: Nasopharyngeal Swab  Result Value Ref Range Status   SARS Coronavirus 2 NEGATIVE NEGATIVE Final    Comment: (NOTE) SARS-CoV-2 target nucleic acids are NOT DETECTED.  The SARS-CoV-2 RNA is generally detectable in upper and lower respiratory specimens during the acute phase of infection. The lowest concentration of SARS-CoV-2 viral copies this assay can detect is 250 copies / mL. A negative result does not preclude SARS-CoV-2 infection and should not be used as the sole basis for treatment or other patient management decisions.  A negative result may occur with improper specimen collection / handling, submission of specimen other than nasopharyngeal swab, presence of viral mutation(s) within the areas targeted by this assay, and inadequate number of viral copies (<250 copies / mL). A negative result must be combined with clinical observations, patient history, and epidemiological information.  Fact Sheet for Patients:   StrictlyIdeas.no  Fact Sheet for Healthcare Providers: BankingDealers.co.za  This test is not yet approved or  cleared by the [place] FDA and has been authorized for detection and/or diagnosis of SARS-CoV-2 by FDA under an Emergency Use Authorization (EUA).  This EUA will remain  in effect (meaning this test can be used) for the duration of the COVID-19 declaration under Section 564(b)(1) of the Act, 21 U.S.C. section 360bbb-3(b)(1), unless the authorization is terminated or revoked sooner.  Performed at [facility], [place] [address], [place], [place] [address]   MRSA PCR Screening     Status: None   Collection Time: 07/30/19  2:39 PM   Specimen: Nasal Mucosa; Nasopharyngeal  Result Value Ref Range Status   MRSA by PCR NEGATIVE NEGATIVE Final    Comment:        The GeneXpert MRSA Assay  (FDA approved for NASAL specimens only), is one component of a comprehensive MRSA colonization surveillance program. It is not intended to diagnose MRSA infection nor to guide or monitor treatment for MRSA infections. Performed at [facility], [place] [address] [place],  [address]      Time coordinating discharge: 39 minutes  SIGNED:   Georgette Shell, MD  [facility] 07/31/2019, 8:47 AM Pager   If 7PM-7AM, please contact night-coverage [email or web address] Password [email or web address]

## 2019-07-31 NOTE — Discharge Instructions (Signed)
Please note that I have stopped Norvasc and Imdur since your blood pressure was  low normal and you were orthostatic.  Please follow-up with your primary care physician.  Please check your blood blood pressure and heart rate at home and write it down and take it to your PCP to see if those medicines need to be restarted.  Continue taking Coreg.  Take care.

## 2019-08-01 ENCOUNTER — Telehealth: Payer: Self-pay

## 2019-08-01 NOTE — Telephone Encounter (Signed)
Transition Care Management Follow-up Telephone Call Date of discharge and from where: 07/30/2019, [place] pt unable to reach/ Left voice message to call back. Name and phone nr provided to call back.

## 2019-08-03 LAB — CULTURE, BLOOD (ROUTINE X 2)
Culture: NO GROWTH
Culture: NO GROWTH

## 2019-08-04 ENCOUNTER — Telehealth: Payer: Self-pay

## 2019-08-04 NOTE — Telephone Encounter (Signed)
Transition Care Management Follow-up Telephone Call  Date of discharge and from where: 07/31/2019, [facility]   How have you been since you were released from the hospital? He said he feels a little sluggish but is generally okay.   Any questions or concerns?  none at this time  Items Reviewed:  Did the pt receive and understand the discharge instructions provided?  Yes he said that he understood them but he did not have them with him at the time of this call.   Medications obtained and verified?  he said that he has all of his medications. He did not have the list with him. Instructed him to call the clinic with any questions after he is able to review the list again.  He said he understood that there are a number of medications on the list that he is to stop taking.   Any new allergies since your discharge?  none reported   Do you have support at home?  lives alone, no family in the area,  No home health ordered  Has home BP monitor and keeps a log of BP readings. This morning he said it was about 120/87.  Has glucometer and checks blood sugars three times daily and also keeps a log of his blood sugars. This morning blood sugar : 205.  Reminded him to bring his BP and blood sugar logs with him to his appt with Dr Margarita Rana next week.   Functional Questionnaire: (I = Independent and D = Dependent) ADLs: independent, has cane to use when needed  Follow up appointments reviewed:   PCP Hospital f/u appt confirmed?Dr Margarita Rana 08/11/2019   [facility] f/u appt confirmed?None scheduled at this time  Are transportation arrangements needed?  no, he has transportation  If their condition worsens, is the pt aware to call PCP or go to the Emergency Dept.?  Yes   Was the patient provided with contact information for the PCP's office or ED?  he has the clinic phone number  Was to pt encouraged to call back with questions or concerns? yes

## 2019-08-04 NOTE — Telephone Encounter (Signed)
From the discharge call.  He has an appt with Dr Margarita Rana 08/11/2019    He said he feels a little sluggish but is generally okay.    he said that he has all of his medications. He did not have the list with him. Instructed him to call the clinic with any questions after he is able to review the list again.  He said he understood that there are a number of medications on the list that he is to stop taking.    Has home BP monitor and keeps a log of BP readings. This morning he said it was about 120/87.   Has glucometer and checks blood sugars three times daily and also keeps a log of his blood sugars. This morning blood sugar : 205.   Reminded him to bring his BP and blood sugar logs with him to his appt with Dr Margarita Rana next week.

## 2019-08-11 ENCOUNTER — Encounter: Payer: Self-pay | Admitting: Family Medicine

## 2019-08-11 ENCOUNTER — Ambulatory Visit: Payer: Medicare Other | Attending: Family Medicine | Admitting: Family Medicine

## 2019-08-11 ENCOUNTER — Other Ambulatory Visit: Payer: Self-pay

## 2019-08-11 VITALS — BP 124/88 | HR 98 | Temp 97.7°F | Ht 67.5 in | Wt 140.0 lb

## 2019-08-11 DIAGNOSIS — E1165 Type 2 diabetes mellitus with hyperglycemia: Secondary | ICD-10-CM

## 2019-08-11 DIAGNOSIS — E1365 Other specified diabetes mellitus with hyperglycemia: Secondary | ICD-10-CM

## 2019-08-11 DIAGNOSIS — Z8249 Family history of ischemic heart disease and other diseases of the circulatory system: Secondary | ICD-10-CM | POA: Diagnosis not present

## 2019-08-11 DIAGNOSIS — E785 Hyperlipidemia, unspecified: Secondary | ICD-10-CM | POA: Insufficient documentation

## 2019-08-11 DIAGNOSIS — I110 Hypertensive heart disease with heart failure: Secondary | ICD-10-CM | POA: Insufficient documentation

## 2019-08-11 DIAGNOSIS — I5022 Chronic systolic (congestive) heart failure: Secondary | ICD-10-CM | POA: Insufficient documentation

## 2019-08-11 DIAGNOSIS — E119 Type 2 diabetes mellitus without complications: Secondary | ICD-10-CM | POA: Diagnosis not present

## 2019-08-11 DIAGNOSIS — Z1211 Encounter for screening for malignant neoplasm of colon: Secondary | ICD-10-CM | POA: Diagnosis not present

## 2019-08-11 DIAGNOSIS — Z941 Heart transplant status: Secondary | ICD-10-CM | POA: Diagnosis not present

## 2019-08-11 DIAGNOSIS — Z7952 Long term (current) use of systemic steroids: Secondary | ICD-10-CM | POA: Diagnosis not present

## 2019-08-11 DIAGNOSIS — N183 Chronic kidney disease, stage 3 unspecified: Secondary | ICD-10-CM

## 2019-08-11 DIAGNOSIS — I252 Old myocardial infarction: Secondary | ICD-10-CM | POA: Insufficient documentation

## 2019-08-11 DIAGNOSIS — Z794 Long term (current) use of insulin: Secondary | ICD-10-CM | POA: Insufficient documentation

## 2019-08-11 DIAGNOSIS — Z09 Encounter for follow-up examination after completed treatment for conditions other than malignant neoplasm: Secondary | ICD-10-CM | POA: Diagnosis present

## 2019-08-11 DIAGNOSIS — Z7982 Long term (current) use of aspirin: Secondary | ICD-10-CM | POA: Insufficient documentation

## 2019-08-11 DIAGNOSIS — I10 Essential (primary) hypertension: Secondary | ICD-10-CM | POA: Diagnosis not present

## 2019-08-11 DIAGNOSIS — Z940 Kidney transplant status: Secondary | ICD-10-CM | POA: Diagnosis not present

## 2019-08-11 DIAGNOSIS — Z79899 Other long term (current) drug therapy: Secondary | ICD-10-CM | POA: Diagnosis not present

## 2019-08-11 DIAGNOSIS — Z91138 Patient's unintentional underdosing of medication regimen for other reason: Secondary | ICD-10-CM | POA: Insufficient documentation

## 2019-08-11 LAB — GLUCOSE, POCT (MANUAL RESULT ENTRY): POC Glucose: 326 mg/dl — AB (ref 70–99)

## 2019-08-11 MED ORDER — LANTUS SOLOSTAR 100 UNIT/ML ~~LOC~~ SOPN: 20.0000 [IU] | PEN_INJECTOR | Freq: Every day | SUBCUTANEOUS | 6 refills | Status: DC

## 2019-08-11 MED ORDER — NOVOLOG FLEXPEN 100 UNIT/ML ~~LOC~~ SOPN: 0.0000 [IU] | PEN_INJECTOR | Freq: Three times a day (TID) | SUBCUTANEOUS | 6 refills | Status: DC

## 2019-08-11 NOTE — Patient Instructions (Signed)

## 2019-08-11 NOTE — Progress Notes (Signed)
Subjective:  Patient ID: [facility], male    DOB: 06/28/1965  Age: 54 y.o. MRN: [id number]  CC: Hospitalization Follow-up (Pt. is here for HFU. )   HPI [facility] is a 54 year old male with a history of type 2 diabetes mellitus (A1c 14.3), ischemic cardiomyopathy (status post heart transplant at [facility] in 12/2018 currently on immunosuppressive therapy), chronic kidney disease stage III (status post renal transplant in 06/2019) At his last visit he was referred to the ED due to hypotension and hyperglycemia and he was admitted for diabetic ketoacidosis from 07/28/19 through 07/31/2019.  Found to have acute on chronic kidney disease. He was treated with IV fluids, his Norvasc and Imdur were held. Discharge creatinine was 1.24.    He is currently on immunosuppressive therapy with Prograft, mycophenolate, prednisone. He has been out of his medications because [facility] does not carry [facility] immunosuppresants.  He has reached out to his specialist at [facility] but is yet to hear from them.  His blood sugar was 189 prior to breakfast today and he endorses compliance with his insulin regimen.  Denies presence of hypoglycemia, blurry vision, numbness in extremities. He has no additional concerns today. Past Medical History:  Diagnosis Date  . Chronic systolic CHF (congestive heart failure) ([facility])    a. 01/2015 Echo: EF 20-25%, antlat, inflat AK.  [facility] CKD (chronic kidney disease), stage III   . Coronary artery disease    a. s/p MI x 4;  b. Reported h/o 17 stents with recurrent ISR;  c. 02/2014 Cath ([facility]): PCI to unknown vessel;  d. 01/2015 MV: EF 18%, large scar, no ischemia; e. 03/2015 PCI: LM nl, LAD nl, D2 patent stent, RI patent stent, LCX patent stents p/m, RCA 95p ISR (PTCA), 72m 100d into RPL CTO, RPDA 90 (2.5x16 Synergy DES).  . Hyperlipidemia   . Hypertensive heart disease   . Ischemic cardiomyopathy    a. s/p SJM ICD 2007 w/ gen change in 2012; b. 12/2014 CPX Test: mild to mod HF  limitation w/ pVO2 20.3 and nl slope;  c. 01/2015 Echo: EF 20-25%.    Past Surgical History:  Procedure Laterality Date  . 17 stints placements Bilateral   . CARDIAC CATHETERIZATION     Most recent 02/2014   . CARDIAC CATHETERIZATION N/A 03/31/2015   Procedure: Left Heart Cath and Coronary Angiography;  Surgeon: TTroy Sine MD;  Location: [facility];  Service: Cardiovascular;  Laterality: N/A;  . CARDIAC CATHETERIZATION N/A 03/31/2015   Procedure: Coronary Stent Intervention;  Surgeon: TTroy Sine MD;  [facility]: [facility];  Service: Cardiovascular;  Laterality: N/A;  . CARDIAC CATHETERIZATION N/A 05/27/2015   Procedure: Right/Left Heart Cath and Coronary Angiography;  Surgeon: DJolaine Artist MD;  [facility]: [facility];  Service: Cardiovascular;  Laterality: N/A;  . CARDIAC CATHETERIZATION N/A 06/23/2015   Procedure: Right/Left Heart Cath and Coronary Angiography;  Surgeon: DJolaine Artist MD;  [facility]: [facility];  Service: Cardiovascular;  Laterality: N/A;  . HEART TRANSPLANT    . INSERT / REPLACE / REMOVE PACEMAKER     [facility] 2006  Generator Change 2012   . NEPHRECTOMY TRANSPLANTED ORGAN    . PACEMAKER GENERATOR CHANGE Bilateral     Family History  Problem Relation Age of Onset  . Cancer Mother        died when pt was 89  She had a h/o heart dzs as well.  . Hypertension Mother   .  Hyperlipidemia Father        Father died when pt was age 79 - he believes that he had a cardiac hx.  [facility] CAD Sister     No Known Allergies  Outpatient Medications Prior to Visit  Medication Sig Dispense Refill  . aspirin 81 MG tablet Take 81 mg by mouth daily.    . Calcium Carbonate-Vitamin D 600-400 MG-UNIT tablet Take 1 tablet by mouth daily.    . carvedilol (COREG) 12.5 MG tablet Take 12.5 mg by mouth 2 (two) times daily.    . cholecalciferol (VITAMIN D) 1000 UNITS tablet Take 1,000 Units by mouth daily.    . Continuous Blood Gluc Receiver (FREESTYLE  LIBRE 2 READER) DEVI 3 Units by Does not apply route in the morning, at noon, and at bedtime. 1 each 4  . Continuous Blood Gluc Sensor (FREESTYLE LIBRE 2 SENSOR) MISC 3 Units by Does not apply route in the morning, at noon, and at bedtime. 1 each 4  . doxazosin (CARDURA) 2 MG tablet Take 2 mg by mouth at bedtime.    [facility] HYDROcodone-acetaminophen (NORCO/VICODIN) 5-325 MG tablet Take 1 tablet by mouth every 4 (four) hours as needed. 10 tablet 0  . Insulin Pen Needle 32G X 4 MM MISC 1 application by Does not apply route 4 (four) times daily. 200 each 5  . magnesium oxide (MAG-OX) 400 (241.3 Mg) MG tablet Take 400 mg by mouth 2 (two) times daily.     . mycophenolate (CELLCEPT) 500 MG tablet Take 1,500 mg by mouth in the morning and at bedtime.    . nitroGLYCERIN (NITROSTAT) 0.4 MG SL tablet Place 1 tablet (0.4 mg total) under the tongue every 5 (five) minutes as needed for chest pain. Reported on 04/12/2015 60 tablet 1  . nystatin (MYCOSTATIN) 100000 UNIT/ML suspension Take 5 mLs by mouth See admin instructions. Swish and swallow 5 ml's by mouth four times a day    . omega-3 acid ethyl esters (LOVAZA) 1 G capsule Take 1 g by mouth daily.     . ondansetron (ZOFRAN) 4 MG tablet Take 1 tablet (4 mg total) every 8 (eight) hours as needed by mouth for nausea or vomiting. Reported on 05/24/2015 30 tablet 1  . pantoprazole (PROTONIX) 40 MG tablet Take 1 tablet by mouth at bedtime.     [facility] PRALUENT 75 MG/ML SOAJ INJECT 75 MG INTO THE SKIN EVERY 14 (FOURTEEN) DAYS. 6 pen 1  . predniSONE (DELTASONE) 5 MG tablet Take 1.5 tablets by mouth in the morning.    . tacrolimus (PROGRAF) 1 MG capsule Take 3-4 mg by mouth See admin instructions. Take 4 mg by mouth in the morning and 3 mg in the evening    . insulin aspart (NOVOLOG FLEXPEN) 100 UNIT/ML FlexPen Inject 3 Units into the skin 3 (three) times daily with meals. And per sliding scale QID (Patient taking differently: Inject 3 Units into the skin See admin instructions.  Inject 3 units into the skin 3-4 times a day after meals, per sliding scale) 15 mL 11  . insulin glargine (LANTUS SOLOSTAR) 100 UNIT/ML Solostar Pen Inject 15 Units into the skin daily. (Patient taking differently: Inject 15 Units into the skin at bedtime. ) 15 mL 11  . ezetimibe (ZETIA) 10 MG tablet Take 1 tablet (10 mg total) by mouth daily. Needs appt for further refills (Patient not taking: Reported on 08/11/2019) 90 tablet 0   No facility-administered medications prior to visit.     ROS Review  of Systems  Constitutional: Negative for activity change and appetite change.  HENT: Negative for sinus pressure and sore throat.   Eyes: Negative for visual disturbance.  Respiratory: Negative for cough, chest tightness and shortness of breath.   Cardiovascular: Negative for chest pain and leg swelling.  Gastrointestinal: Negative for abdominal distention, abdominal pain, constipation and diarrhea.  Endocrine: Negative.   Genitourinary: Negative for dysuria.  Musculoskeletal: Negative for joint swelling and myalgias.  Skin: Negative for rash.  Allergic/Immunologic: Negative.   Neurological: Negative for weakness, light-headedness and numbness.  Psychiatric/Behavioral: Negative for dysphoric mood and suicidal ideas.    Objective:  BP (!) 124/88 (BP Location: Left Arm, Patient Position: Sitting, Cuff Size: Normal)   Pulse 98   Temp 97.7 F (36.5 C) (Temporal)   Ht 5' 7.5" (1.715 m)   Wt 140 lb (63.5 kg)   SpO2 99%   BMI 21.60 kg/m   BP/Weight 08/11/2019 07/31/2019 9/62/9528  Systolic BP 413 244 -  Diastolic BP 88 91 -  Wt. (Lbs) 140 - 137.13  BMI 21.6 - -      Physical Exam Constitutional:      Appearance: He is well-developed.  Neck:     Vascular: No JVD.  Cardiovascular:     Rate and Rhythm: Normal rate.     Heart sounds: Normal heart sounds. No murmur heard.   Pulmonary:     Effort: Pulmonary effort is normal.     Breath sounds: Normal breath sounds. No wheezing or  rales.  Chest:     Chest wall: No tenderness.  Abdominal:     General: Bowel sounds are normal. There is no distension.     Palpations: Abdomen is soft. There is no mass.     Tenderness: There is no abdominal tenderness.  Musculoskeletal:        General: Normal range of motion.     Right lower leg: No edema.     Left lower leg: No edema.  Neurological:     Mental Status: He is alert and oriented to person, place, and time.  Psychiatric:        Mood and Affect: Mood normal.     CMP Latest Ref Rng & Units 07/31/2019 07/30/2019 07/29/2019  Glucose 70 - 99 mg/dL 209(H) 294(H) 102(H)  BUN 6 - 20 mg/dL 22(H) 25(H) 31(H)  Creatinine 0.61 - 1.24 mg/dL 1.24 1.57(H) 1.29(H)  Sodium 135 - 145 mmol/L 137 133(L) 137  Potassium 3.5 - 5.1 mmol/L 4.2 4.1 3.3(L)  Chloride 98 - 111 mmol/L 102 100 101  CO2 22 - 32 mmol/L 23 21(L) 22  Calcium 8.9 - 10.3 mg/dL 8.8(L) 9.2 9.1  Total Protein 6.5 - 8.1 g/dL - - -  Total Bilirubin 0.3 - 1.2 mg/dL - - -  Alkaline Phos 38 - 126 U/L - - -  AST 15 - 41 U/L - - -  ALT 0 - 44 U/L - - -    Lipid Panel     Component Value Date/Time   CHOL 88 (L) 08/21/2016 1037   TRIG 92 08/21/2016 1037   HDL 47 08/21/2016 1037   CHOLHDL 1.9 08/21/2016 1037   CHOLHDL 2.3 06/22/2015 1609   VLDL 42 (H) 06/22/2015 1609   LDLCALC 23 08/21/2016 1037    CBC    Component Value Date/Time   WBC 7.8 07/29/2019 1450   RBC 5.10 07/29/2019 1450   HGB 17.0 07/29/2019 1643   HGB 15.0 05/08/2018 0800   HCT 50.0 07/29/2019 1643  HCT 43.2 05/08/2018 0800   PLT 242 07/29/2019 1450   PLT 203 05/08/2018 0800   MCV 92.2 07/29/2019 1450   MCV 98 (H) 05/08/2018 0800   MCH 32.0 07/29/2019 1450   MCHC 34.7 07/29/2019 1450   RDW 12.2 07/29/2019 1450   RDW 12.3 05/08/2018 0800   LYMPHSABS 0.6 (L) 07/29/2019 1450   LYMPHSABS 1.3 05/08/2018 0800   MONOABS 0.4 07/29/2019 1450   EOSABS 0.0 07/29/2019 1450   EOSABS 0.1 05/08/2018 0800   BASOSABS 0.0 07/29/2019 1450   BASOSABS 0.0  05/08/2018 0800    [facility] Results  Component Value Date   HGBA1C 13.7 (H) 07/29/2019    Assessment & Plan:   1. DM (diabetes mellitus), secondary uncontrolled ([place]) Uncontrolled with A1c of 13.7; goal is less than 7.0 Current prednisone use also contributing to poor glycemic control. Increased Lantus to 20 units daily and switch from fixed regimen of NovoLog to sliding scale which she has been provided with For blood sugars 0-150 give 0 units of insulin, 151-200 give 2 units of insulin, 201-250 give 4 units, 251-300 give 6 units, 301-350 give 8 units, 351-400 give 10 units,> 400 give 12 units and call M.D. Discussed hypoglycemia protocol. I will see him back in 1 month to review his blood sugar log Counseled on Diabetic diet, my plate method, 814 minutes of moderate intensity exercise/week Blood sugar logs with fasting goals of 80-120 mg/dl, random of less than 180 and in the event of sugars less than 60 mg/dl or greater than 400 mg/dl encouraged to notify the clinic. Advised on the need for annual eye exams, annual foot exams, Pneumonia vaccine. - Glucose (CBG) - CMP14+EGFR - insulin glargine (LANTUS SOLOSTAR) 100 UNIT/ML Solostar Pen; Inject 20 Units into the skin daily.  Dispense: 30 mL; Refill: 6 - insulin aspart (NOVOLOG FLEXPEN) 100 UNIT/ML FlexPen; Inject 0-12 Units into the skin 3 (three) times daily with meals. Per sliding scale  Dispense: 30 mL; Refill: 6  2. Colon cancer screening - Fecal occult blood, imunochemical([facility])  3. H/O heart transplant ([place]) Secondary to ischemic cardiomyopathy Currently on immunosuppressants Followed by [facility] transplant team  4. Renal transplant, status post Underlying history of stage III chronic kidney disease Discharge creatinine was 1.24 We will check renal function again  5. Benign essential HTN Controlled Continue current antihypertensives Counseled on blood pressure goal of less than 130/80, low-sodium, DASH diet,  medication compliance, 150 minutes of moderate intensity exercise per week. Discussed medication compliance, adverse effects.   6. Stage 3 chronic kidney disease, unspecified whether stage 3a or 3b CKD Status post renal transplant  7. Chronic systolic heart failure (HCC) Status post cardiac transplant Euvolemic  8. High risk medication use High risk medications including Prograf, mycophenolate, prednisone We will need to keep an eye on his blood sugars especially with this and will also check his renal function. Advised to reach out to his [facility] specialist as he cannot afford to be off his medications.  He might have to drive over to the [place] to obtain these Follow-up with his specialist  Meds ordered this encounter  Medications  . insulin glargine (LANTUS SOLOSTAR) 100 UNIT/ML Solostar Pen    Sig: Inject 20 Units into the skin daily.    Dispense:  30 mL    Refill:  6    Dose increase  . insulin aspart (NOVOLOG FLEXPEN) 100 UNIT/ML FlexPen    Sig: Inject 0-12 Units into the skin 3 (three) times daily with meals.  Per sliding scale    Dispense:  30 mL    Refill:  6    Follow-up: Return in about 1 month (around 09/11/2019) for Diabetes mellitus.       Charlott Rakes, MD, FAAFP. [facility] [place], [place]   08/11/2019, 2:19 PM

## 2019-08-12 ENCOUNTER — Encounter: Payer: Self-pay | Admitting: Family Medicine

## 2019-08-12 ENCOUNTER — Other Ambulatory Visit (HOSPITAL_COMMUNITY): Payer: Self-pay | Admitting: Adult Health

## 2019-08-12 LAB — CMP14+EGFR
ALT: 14 IU/L (ref 0–44)
AST: 23 IU/L (ref 0–40)
Albumin/Globulin Ratio: 1.7 (ref 1.2–2.2)
Albumin: 4.5 g/dL (ref 3.8–4.9)
Alkaline Phosphatase: 111 IU/L (ref 48–121)
BUN/Creatinine Ratio: 10 (ref 9–20)
BUN: 15 mg/dL (ref 6–24)
Bilirubin Total: 0.5 mg/dL (ref 0.0–1.2)
CO2: 19 mmol/L — ABNORMAL LOW (ref 20–29)
Calcium: 10.1 mg/dL (ref 8.7–10.2)
Chloride: 100 mmol/L (ref 96–106)
Creatinine, Ser: 1.48 mg/dL — ABNORMAL HIGH (ref 0.76–1.27)
GFR calc Af Amer: 61 mL/min/{1.73_m2} (ref >59)
GFR calc non Af Amer: 53 mL/min/{1.73_m2} — ABNORMAL LOW (ref >59)
Globulin, Total: 2.7 g/dL (ref 1.5–4.5)
Glucose: 326 mg/dL — ABNORMAL HIGH (ref 65–99)
Potassium: 4.5 mmol/L (ref 3.5–5.2)
Sodium: 138 mmol/L (ref 134–144)
Total Protein: 7.2 g/dL (ref 6.0–8.5)

## 2019-08-14 ENCOUNTER — Telehealth: Payer: Self-pay

## 2019-08-14 NOTE — Telephone Encounter (Signed)
Patient name and DOB has been verified Patient was informed of lab results. Patient had no questions.  

## 2019-08-14 NOTE — Telephone Encounter (Signed)
-----   Message from Charlott Rakes, MD sent at 08/12/2019  9:07 AM EDT ----- Kidney function is slightly abnormal but stable.  Please advise him to keep his follow-up appointment with his transplant specialist.  Glucose was elevated and I will need him to continue with the new regimen of insulin we discussed at his last visit.

## 2019-09-02 DIAGNOSIS — Z48298 Encounter for aftercare following other organ transplant: Secondary | ICD-10-CM | POA: Diagnosis not present

## 2019-09-02 DIAGNOSIS — Z7982 Long term (current) use of aspirin: Secondary | ICD-10-CM | POA: Diagnosis not present

## 2019-09-02 DIAGNOSIS — N183 Chronic kidney disease, stage 3 unspecified: Secondary | ICD-10-CM | POA: Diagnosis not present

## 2019-09-02 DIAGNOSIS — I1310 Hypertensive heart and chronic kidney disease without heart failure, with stage 1 through stage 4 chronic kidney disease, or unspecified chronic kidney disease: Secondary | ICD-10-CM | POA: Diagnosis not present

## 2019-09-02 DIAGNOSIS — H538 Other visual disturbances: Secondary | ICD-10-CM | POA: Diagnosis not present

## 2019-09-02 DIAGNOSIS — I10 Essential (primary) hypertension: Secondary | ICD-10-CM | POA: Diagnosis not present

## 2019-09-02 DIAGNOSIS — E1122 Type 2 diabetes mellitus with diabetic chronic kidney disease: Secondary | ICD-10-CM | POA: Diagnosis not present

## 2019-09-02 DIAGNOSIS — Z79899 Other long term (current) drug therapy: Secondary | ICD-10-CM | POA: Diagnosis not present

## 2019-09-02 DIAGNOSIS — Z941 Heart transplant status: Secondary | ICD-10-CM | POA: Diagnosis not present

## 2019-09-02 DIAGNOSIS — E785 Hyperlipidemia, unspecified: Secondary | ICD-10-CM | POA: Diagnosis not present

## 2019-09-02 DIAGNOSIS — Z23 Encounter for immunization: Secondary | ICD-10-CM | POA: Diagnosis not present

## 2019-09-02 DIAGNOSIS — Z7952 Long term (current) use of systemic steroids: Secondary | ICD-10-CM | POA: Diagnosis not present

## 2019-09-02 DIAGNOSIS — I371 Nonrheumatic pulmonary valve insufficiency: Secondary | ICD-10-CM | POA: Diagnosis not present

## 2019-09-02 DIAGNOSIS — E139 Other specified diabetes mellitus without complications: Secondary | ICD-10-CM | POA: Diagnosis not present

## 2019-09-02 DIAGNOSIS — T8621 Heart transplant rejection: Secondary | ICD-10-CM | POA: Diagnosis not present

## 2019-09-02 DIAGNOSIS — I514 Myocarditis, unspecified: Secondary | ICD-10-CM | POA: Diagnosis not present

## 2019-09-19 ENCOUNTER — Encounter (HOSPITAL_COMMUNITY): Payer: Self-pay

## 2019-09-19 DIAGNOSIS — D849 Immunodeficiency, unspecified: Secondary | ICD-10-CM | POA: Diagnosis not present

## 2019-09-19 DIAGNOSIS — Z940 Kidney transplant status: Secondary | ICD-10-CM | POA: Diagnosis not present

## 2019-09-19 DIAGNOSIS — H538 Other visual disturbances: Secondary | ICD-10-CM | POA: Diagnosis not present

## 2019-09-19 DIAGNOSIS — E139 Other specified diabetes mellitus without complications: Secondary | ICD-10-CM | POA: Diagnosis not present

## 2019-09-23 DIAGNOSIS — Z48298 Encounter for aftercare following other organ transplant: Secondary | ICD-10-CM | POA: Diagnosis not present

## 2019-09-23 DIAGNOSIS — Z941 Heart transplant status: Secondary | ICD-10-CM | POA: Diagnosis not present

## 2019-09-23 DIAGNOSIS — Z23 Encounter for immunization: Secondary | ICD-10-CM | POA: Diagnosis not present

## 2019-09-25 ENCOUNTER — Ambulatory Visit: Payer: Medicare Other | Attending: Family Medicine | Admitting: Family Medicine

## 2019-09-29 ENCOUNTER — Telehealth (HOSPITAL_COMMUNITY): Payer: Self-pay | Admitting: Student-PharmD

## 2019-09-30 ENCOUNTER — Other Ambulatory Visit: Payer: Self-pay

## 2019-09-30 ENCOUNTER — Encounter (HOSPITAL_COMMUNITY)
Admission: RE | Admit: 2019-09-30 | Discharge: 2019-09-30 | Disposition: A | Discharge status: Home / Self Care | Payer: Medicare Other | Source: Ambulatory Visit | Attending: Cardiology | Admitting: Cardiology

## 2019-09-30 DIAGNOSIS — Z941 Heart transplant status: Secondary | ICD-10-CM | POA: Insufficient documentation

## 2019-09-30 NOTE — Progress Notes (Signed)
Cardiac Rehab Telephone Note:  Successful telephone encounter to [facility] to confirm Cardiac Rehab orientation appointment for 10/02/19 at 8:30 am. Nursing assessment completed. Patient questions answered. Instructions for appointment provided. Patient screening for Covid-19 negative.  Bennett Ram E. Rollene Rotunda RN, BSN [place]. [facility]  Cardiac and Pulmonary Rehabilitation Phone: [phone] Fax: [phone]

## 2019-09-30 NOTE — Telephone Encounter (Signed)
Cardiac Rehab Medication Review by a Pharmacist  Does the patient feel that his/her medications are working for him/her?  Yes  Has the patient been experiencing any side effects to the medications prescribed?  No   Does the patient measure his/her own blood pressure or blood glucose at home?  Yes - checks blood pressures twice daily. This morning it was 120/86, last night it was 128/92. Uses Freestyle Libre to check blood sugars. It was 197 last night, 206 this morning. He reports it usually below 200.   Does the patient have any problems obtaining medications due to transportation or finances?   No  Understanding of regimen: good Understanding of indications: good Potential of compliance: good    Pharmacist Intervention: No medication interventions recommended at this time.    [name], PharmD PGY1 Pharmacy Resident 09/30/2019 1:19 PM

## 2019-10-02 ENCOUNTER — Other Ambulatory Visit: Payer: Self-pay

## 2019-10-02 ENCOUNTER — Encounter (HOSPITAL_COMMUNITY)
Admission: RE | Admit: 2019-10-02 | Discharge: 2019-10-02 | Disposition: A | Discharge status: Home / Self Care | Payer: Medicare Other | Source: Ambulatory Visit | Attending: Cardiology | Admitting: Cardiology

## 2019-10-02 ENCOUNTER — Encounter (HOSPITAL_COMMUNITY): Payer: Self-pay

## 2019-10-02 VITALS — BP 122/80 | HR 89 | Ht 67.5 in | Wt 145.5 lb

## 2019-10-02 DIAGNOSIS — Z941 Heart transplant status: Secondary | ICD-10-CM | POA: Diagnosis not present

## 2019-10-02 LAB — GLUCOSE, CAPILLARY: Glucose-Capillary: 207 mg/dL — ABNORMAL HIGH (ref 70–99)

## 2019-10-02 NOTE — Progress Notes (Signed)
Cardiac Individual Treatment Plan  Patient Details  Name: Marvin Martin MRN: [id number] Date of Birth: 02/01/1965 Referring Provider:     [facility] from 10/02/2019 in [place]  Referring Provider Lattie Haw MD, Dr Fransico Him MD Covering      Initial Encounter Date:    CARDIAC REHAB [place] from 10/02/2019 in [place]  Date 10/02/19      Visit Diagnosis: S/P Heart Transplant 12/29/18 - Kidney Transplant 12/30/18  Patient's Home Medications on Admission:  Current Outpatient Medications:  .  amLODipine (NORVASC) 10 MG tablet, Take 10 mg by mouth daily., Disp: , Rfl:  .  aspirin 81 MG tablet, Take 81 mg by mouth daily., Disp: , Rfl:  .  Calcium Carbonate-Vitamin D 600-400 MG-UNIT tablet, Take 1 tablet by mouth daily., Disp: , Rfl:  .  carvedilol (COREG) 12.5 MG tablet, Take 12.5 mg by mouth 2 (two) times daily., Disp: , Rfl:  .  cholecalciferol (VITAMIN D) 1000 UNITS tablet, Take 1,000 Units by mouth daily., Disp: , Rfl:  .  Continuous Blood Gluc Receiver (FREESTYLE LIBRE 2 READER) DEVI, 3 Units by Does not apply route in the morning, at noon, and at bedtime., Disp: 1 each, Rfl: 4 .  Continuous Blood Gluc Sensor (FREESTYLE LIBRE 2 SENSOR) MISC, 3 Units by Does not apply route in the morning, at noon, and at bedtime., Disp: 1 each, Rfl: 4 .  doxazosin (CARDURA) 2 MG tablet, Take 2 mg by mouth at bedtime., Disp: , Rfl:  .  ezetimibe (ZETIA) 10 MG tablet, Take 1 tablet (10 mg total) by mouth daily. Needs appt for further refills, Disp: 90 tablet, Rfl: 0 .  HYDROcodone-acetaminophen (NORCO/VICODIN) 5-325 MG tablet, Take 1 tablet by mouth every 4 (four) hours as needed., Disp: 10 tablet, Rfl: 0 .  insulin aspart (NOVOLOG FLEXPEN) 100 UNIT/ML FlexPen, Inject 0-12 Units into the skin 3 (three) times daily with meals. Per sliding scale, Disp: 30 mL, Rfl: 6 .  insulin glargine (LANTUS  SOLOSTAR) 100 UNIT/ML Solostar Pen, Inject 20 Units into the skin daily., Disp: 30 mL, Rfl: 6 .  Insulin Pen Needle 32G X 4 MM MISC, 1 application by Does not apply route 4 (four) times daily., Disp: 200 each, Rfl: 5 .  isosorbide mononitrate (IMDUR) 30 MG 24 hr tablet, TAKE 1 TABLET BY MOUTH EVERY DAY, Disp: 90 tablet, Rfl: 3 .  magnesium oxide (MAG-OX) 400 (241.3 Mg) MG tablet, Take 400 mg by mouth 2 (two) times daily. , Disp: , Rfl:  .  mycophenolate (CELLCEPT) 500 MG tablet, Take 1,500 mg by mouth in the morning and at bedtime., Disp: , Rfl:  .  nitroGLYCERIN (NITROSTAT) 0.4 MG SL tablet, Place 1 tablet (0.4 mg total) under the tongue every 5 (five) minutes as needed for chest pain. Reported on 04/12/2015, Disp: 60 tablet, Rfl: 1 .  nystatin (MYCOSTATIN) 100000 UNIT/ML suspension, Take 5 mLs by mouth See admin instructions. Swish and swallow 5 ml's by mouth four times a day, Disp: , Rfl:  .  omega-3 acid ethyl esters (LOVAZA) 1 G capsule, Take 1 g by mouth daily. , Disp: , Rfl:  .  ondansetron (ZOFRAN) 4 MG tablet, Take 1 tablet (4 mg total) every 8 (eight) hours as needed by mouth for nausea or vomiting. Reported on 05/24/2015, Disp: 30 tablet, Rfl: 1 .  pantoprazole (PROTONIX) 40 MG tablet, Take 1 tablet by mouth at bedtime. , Disp: ,  Rfl:  .  PRALUENT 75 MG/ML SOAJ, INJECT 75 MG INTO THE SKIN EVERY 14 (FOURTEEN) DAYS., Disp: 6 pen, Rfl: 1 .  predniSONE (DELTASONE) 5 MG tablet, Take 1 tablet by mouth in the morning. , Disp: , Rfl:  .  tacrolimus (PROGRAF) 1 MG capsule, Take 3-4 mg by mouth See admin instructions. Take 4 mg by mouth in the morning and 3 mg in the evening, Disp: , Rfl:   Past Medical History: Past Medical History:  Diagnosis Date  . Chronic systolic CHF (congestive heart failure) ([place])    a. 01/2015 Echo: EF 20-25%, antlat, inflat AK.  [name] CKD (chronic kidney disease), stage III   . Coronary artery disease    a. s/p MI x 4;  b. Reported h/o 17 stents with recurrent ISR;  c. 02/2014  Cath ([place]): PCI to unknown vessel;  d. 01/2015 MV: EF 18%, large scar, no ischemia; e. 03/2015 PCI: LM nl, LAD nl, D2 patent stent, RI patent stent, LCX patent stents p/m, RCA 95p ISR (PTCA), 16m, 100d into RPL CTO, RPDA 90 (2.5x16 Synergy DES).  . Hyperlipidemia   . Hypertensive heart disease   . Ischemic cardiomyopathy    a. s/p SJM ICD 2007 w/ gen change in 2012; b. 12/2014 CPX Test: mild to mod HF limitation w/ pVO2 20.3 and nl slope;  c. 01/2015 Echo: EF 20-25%.    Tobacco Use: Social History   Tobacco Use  Smoking Status Never Smoker  Smokeless Tobacco Never Used    Labs: Recent Review Flowsheet Data    Labs for ITP Cardiac and Pulmonary Rehab Latest Ref Rng & Units 09/16/2015 08/21/2016 04/05/2019 07/29/2019 07/29/2019   Cholestrol 100 - 199 mg/dL - 88(L) - - -   LDLCALC 0 - 99 mg/dL - 23 - - -   HDL >39 mg/dL - 47 - - -   Trlycerides 0 - 149 mg/dL - 92 - - -   Hemoglobin A1c 4.8 - 5.6 % - - 14.0(H) 14.3(A) 13.7(H)   PHART 7.35 - 7.45 - - - - -   PCO2ART 35 - 45 mmHg - - - - -   HCO3 20.0 - 28.0 mmol/L - - 25.4 - 25.7   TCO2 22 - 32 mmol/L - - 26 - 27   ACIDBASEDEF 0.0 - 2.0 mmol/L - - - - -   O2SAT % 63.1 - 97.0 - 99.0      Capillary Blood Glucose: Lab Results  Component Value Date   GLUCAP 207 (H) 10/02/2019   GLUCAP 218 (H) 07/31/2019   GLUCAP 79 07/30/2019   GLUCAP 62 (L) 07/30/2019   GLUCAP 287 (H) 07/30/2019     Exercise Target Goals: Exercise Program Goal: Individual exercise prescription set using results from initial 6 min walk test and THRR while considering  patient's activity barriers and safety.   Exercise Prescription Goal: Starting with aerobic activity 30 plus minutes a day, 3 days per week for initial exercise prescription. Provide home exercise prescription and guidelines that participant acknowledges understanding prior to discharge.  Activity Barriers & Risk Stratification:  Activity Barriers & Cardiac Risk Stratification - 10/02/19 1033       Activity Barriers & Cardiac Risk Stratification   Activity Barriers None    Cardiac Risk Stratification High           6 Minute Walk:  6 Minute Walk    Row Name 10/02/19 1031         6 Minute Walk   Phase  Initial     Distance 1406 feet     Walk Time 6 minutes     # of Rest Breaks 0     MPH 2.7     METS 4.2     RPE 11     Perceived Dyspnea  0     VO2 Peak 14.6     Symptoms No     Resting HR 89 bpm     Resting BP 122/80     Resting Oxygen Saturation  99 %     Exercise Oxygen Saturation  during 6 min walk 100 %     Max Ex. HR 92 bpm     Max Ex. BP 130/82     2 Minute Post BP 114/76            Oxygen Initial Assessment:   Oxygen Re-Evaluation:   Oxygen Discharge (Final Oxygen Re-Evaluation):   Initial Exercise Prescription:  Initial Exercise Prescription - 10/02/19 1000      Date of Initial Exercise RX and Referring Provider   Date 10/02/19    Referring Provider Lattie Haw MD, Dr Fransico Him MD Covering    Expected Discharge Date 11/28/19      Treadmill   MPH 2.4    Grade 1    Minutes 15    METs 3.17      NuStep   Level 2    SPM 80    Minutes 15    METs 2.5      Prescription Details   Frequency (times per week) 3x    Duration Progress to 30 minutes of continuous aerobic without signs/symptoms of physical distress      Intensity   THRR 40-80% of Max Heartrate 66-133    Ratings of Perceived Exertion 11-13    Perceived Dyspnea 0-4      Progression   Progression Continue progressive overload as per policy without signs/symptoms or physical distress.      Resistance Training   Training Prescription Yes    Weight 3lbs    Reps 10-15           Perform Capillary Blood Glucose checks as needed.  Exercise Prescription Changes:   Exercise Comments:   Exercise Goals and Review:  Exercise Goals    Row Name 10/02/19 1033             Exercise Goals   Increase Physical Activity Yes       Intervention Provide advice, education,  support and counseling about physical activity/exercise needs.;Develop an individualized exercise prescription for aerobic and resistive training based on initial evaluation findings, risk stratification, comorbidities and participant's personal goals.       Expected Outcomes Short Term: Attend rehab on a regular basis to increase amount of physical activity.;Long Term: Add in home exercise to make exercise part of routine and to increase amount of physical activity.;Long Term: Exercising regularly at least 3-5 days a week.       Increase Strength and Stamina Yes       Intervention Provide advice, education, support and counseling about physical activity/exercise needs.;Develop an individualized exercise prescription for aerobic and resistive training based on initial evaluation findings, risk stratification, comorbidities and participant's personal goals.       Expected Outcomes Short Term: Increase workloads from initial exercise prescription for resistance, speed, and METs.;Short Term: Perform resistance training exercises routinely during rehab and add in resistance training at home;Long Term: Improve cardiorespiratory fitness, muscular endurance and strength as measured by increased METs  and functional capacity (6MWT)       Able to understand and use rate of perceived exertion (RPE) scale Yes       Intervention Provide education and explanation on how to use RPE scale       Expected Outcomes Short Term: Able to use RPE daily in rehab to express subjective intensity level;Long Term:  Able to use RPE to guide intensity level when exercising independently       Knowledge and understanding of Target Heart Rate Range (THRR) Yes       Intervention Provide education and explanation of THRR including how the numbers were predicted and where they are located for reference       Expected Outcomes Short Term: Able to state/look up THRR;Long Term: Able to use THRR to govern intensity when exercising  independently;Short Term: Able to use daily as guideline for intensity in rehab       Able to check pulse independently Yes       Intervention Provide education and demonstration on how to check pulse in carotid and radial arteries.;Review the importance of being able to check your own pulse for safety during independent exercise       Expected Outcomes Short Term: Able to explain why pulse checking is important during independent exercise;Long Term: Able to check pulse independently and accurately       Understanding of Exercise Prescription Yes       Intervention Provide education, explanation, and written materials on patient's individual exercise prescription       Expected Outcomes Short Term: Able to explain program exercise prescription;Long Term: Able to explain home exercise prescription to exercise independently              Exercise Goals Re-Evaluation :    Discharge Exercise Prescription (Final Exercise Prescription Changes):   Nutrition:  Target Goals: Understanding of nutrition guidelines, daily intake of sodium 1500mg , cholesterol 200mg , calories 30% from fat and 7% or less from saturated fats, daily to have 5 or more servings of fruits and vegetables.  Biometrics:  Pre Biometrics - 10/02/19 1032      Pre Biometrics   Height 5' 7.5" (1.715 m)    Weight 66 kg    Waist Circumference 33.5 inches    Hip Circumference 36 inches    Waist to Hip Ratio 0.93 %    BMI (Calculated) 22.44    Triceps Skinfold 7 mm    % Body Fat 19.1 %    Grip Strength 43 kg    Flexibility 13.5 in    Single Leg Stand 18.37 seconds            Nutrition Therapy Plan and Nutrition Goals:   Nutrition Assessments:   Nutrition Goals Re-Evaluation:   Nutrition Goals Discharge (Final Nutrition Goals Re-Evaluation):   Psychosocial: Target Goals: Acknowledge presence or absence of significant depression and/or stress, maximize coping skills, provide positive support system.  Participant is able to verbalize types and ability to use techniques and skills needed for reducing stress and depression.  Initial Review & Psychosocial Screening:  Initial Psych Review & Screening - 10/02/19 1221      Initial Review   Current issues with None Identified      Family Dynamics   Good Support System? Yes   Wyndham lives alone. Jaylan has his brother and sister who live near by for support     Barriers   Psychosocial barriers to participate in program There are no identifiable barriers or psychosocial  needs.      Screening Interventions   Interventions Encouraged to exercise           Quality of Life Scores:  Quality of Life - 10/02/19 1029      Quality of Life   Select Quality of Life      Quality of Life Scores   Health/Function Pre 28.47 %    Socioeconomic Pre 26.69 %    Psych/Spiritual Pre 30 %    Family Pre 27.4 %    GLOBAL Pre 28.21 %          Scores of 19 and below usually indicate a poorer quality of life in these areas.  A difference of  2-3 points is a clinically meaningful difference.  A difference of 2-3 points in the total score of the Quality of Life Index has been associated with significant improvement in overall quality of life, self-image, physical symptoms, and general health in studies assessing change in quality of life.  PHQ-9: Recent Review Flowsheet Data    Depression screen [facility] 2/9 10/02/2019 08/11/2019 07/29/2019 01/07/2018 08/03/2015   Decreased Interest 0 - 0 0 0   Down, Depressed, Hopeless 0 0 0 0 0   PHQ - 2 Score 0 0 0 0 0   Altered sleeping - 0 0 0 -   Tired, decreased energy - 1 2 0 -   Change in appetite - 0 0 0 -   Feeling bad or failure about yourself  - 0 0 0 -   Trouble concentrating - 0 0 0 -   Moving slowly or fidgety/restless - 0 0 0 -   Suicidal thoughts - 0 0 0 -   PHQ-9 Score - 1 2 0 -     Interpretation of Total Score  Total Score Depression Severity:  1-4 = Minimal depression, 5-9 = Mild depression, 10-14  = Moderate depression, 15-19 = Moderately severe depression, 20-27 = Severe depression   Psychosocial Evaluation and Intervention:   Psychosocial Re-Evaluation:   Psychosocial Discharge (Final Psychosocial Re-Evaluation):   Vocational Rehabilitation: Provide vocational rehab assistance to qualifying candidates.   Vocational Rehab Evaluation & Intervention:  Vocational Rehab - 10/02/19 1222      Initial Vocational Rehab Evaluation & Intervention   Assessment shows need for Vocational Rehabilitation No           Education: Education Goals: Education classes will be provided on a weekly basis, covering required topics. Participant will state understanding/return demonstration of topics presented.  Learning Barriers/Preferences:  Learning Barriers/Preferences - 10/02/19 1029      Learning Barriers/Preferences   Learning Barriers None    Learning Preferences Written Material;Group Instruction;Individual Instruction           Education Topics: Hypertension, Hypertension Reduction -Define heart disease and high blood pressure. Discus how high blood pressure affects the body and ways to reduce high blood pressure.   Exercise and Your Heart -Discuss why it is important to exercise, the FITT principles of exercise, normal and abnormal responses to exercise, and how to exercise safely.   Angina -Discuss definition of angina, causes of angina, treatment of angina, and how to decrease risk of having angina.   Cardiac Medications -Review what the following cardiac medications are used for, how they affect the body, and side effects that may occur when taking the medications.  Medications include Aspirin, Beta blockers, calcium channel blockers, ACE Inhibitors, angiotensin receptor blockers, diuretics, digoxin, and antihyperlipidemics.   Congestive Heart Failure -Discuss the definition of  CHF, how to live with CHF, the signs and symptoms of CHF, and how keep track of weight  and sodium intake.   Heart Disease and Intimacy -Discus the effect sexual activity has on the heart, how changes occur during intimacy as we age, and safety during sexual activity.   Smoking Cessation / COPD -Discuss different methods to quit smoking, the health benefits of quitting smoking, and the definition of COPD.   Nutrition I: Fats -Discuss the types of cholesterol, what cholesterol does to the heart, and how cholesterol levels can be controlled.   Nutrition II: Labels -Discuss the different components of food labels and how to read food label   Heart Parts/Heart Disease and PAD -Discuss the anatomy of the heart, the pathway of blood circulation through the heart, and these are affected by heart disease.   Stress I: Signs and Symptoms -Discuss the causes of stress, how stress may lead to anxiety and depression, and ways to limit stress.   Stress II: Relaxation -Discuss different types of relaxation techniques to limit stress.   Warning Signs of Stroke / TIA -Discuss definition of a stroke, what the signs and symptoms are of a stroke, and how to identify when someone is having stroke.   Knowledge Questionnaire Score:  Knowledge Questionnaire Score - 10/02/19 1029      Knowledge Questionnaire Score   Pre Score 17/24           Core Components/Risk Factors/Patient Goals at Admission:  Personal Goals and Risk Factors at Admission - 10/02/19 1222      Core Components/Risk Factors/Patient Goals on Admission    Weight Management Yes;Weight Gain;Weight Maintenance    Intervention Weight Management: Develop a combined nutrition and exercise program designed to reach desired caloric intake, while maintaining appropriate intake of nutrient and fiber, sodium and fats, and appropriate energy expenditure required for the weight goal.;Weight Management/Obesity: Establish reasonable short term and long term weight goals.;Weight Management: Provide education and appropriate  resources to help participant work on and attain dietary goals.    Admit Weight 145 lb 8.1 oz (66 kg)    Goal Weight: Long Term 155 lb (70.3 kg)    Expected Outcomes Weight Gain: Understanding of general recommendations for a high calorie, high protein meal plan that promotes weight gain by distributing calorie intake throughout the day with the consumption for 4-5 meals, snacks, and/or supplements;Understanding of distribution of calorie intake throughout the day with the consumption of 4-5 meals/snacks;Understanding recommendations for meals to include 15-35% energy as protein, 25-35% energy from fat, 35-60% energy from carbohydrates, less than 200mg  of dietary cholesterol, 20-35 gm of total fiber daily;Short Term: Continue to assess and modify interventions until short term weight is achieved;Long Term: Adherence to nutrition and physical activity/exercise program aimed toward attainment of established weight goal    Diabetes Yes    Intervention Provide education about signs/symptoms and action to take for hypo/hyperglycemia.;Provide education about proper nutrition, including hydration, and aerobic/resistive exercise prescription along with prescribed medications to achieve blood glucose in normal ranges: Fasting glucose 65-99 mg/dL    Expected Outcomes Short Term: Participant verbalizes understanding of the signs/symptoms and immediate care of hyper/hypoglycemia, proper foot care and importance of medication, aerobic/resistive exercise and nutrition plan for blood glucose control.;Long Term: Attainment of HbA1C < 7%.    Heart Failure Yes    Intervention Provide a combined exercise and nutrition program that is supplemented with education, support and counseling about heart failure. Directed toward relieving symptoms such as shortness of  breath, decreased exercise tolerance, and extremity edema.    Expected Outcomes Improve functional capacity of life;Short term: Attendance in program 2-3 days a week  with increased exercise capacity. Reported lower sodium intake. Reported increased fruit and vegetable intake. Reports medication compliance.;Short term: Daily weights obtained and reported for increase. Utilizing diuretic protocols set by physician.;Long term: Adoption of self-care skills and reduction of barriers for early signs and symptoms recognition and intervention leading to self-care maintenance.    Hypertension Yes    Intervention Provide education on lifestyle modifcations including regular physical activity/exercise, weight management, moderate sodium restriction and increased consumption of fresh fruit, vegetables, and low fat dairy, alcohol moderation, and smoking cessation.;Monitor prescription use compliance.    Expected Outcomes Short Term: Continued assessment and intervention until BP is < 140/77mm HG in hypertensive participants. < 130/65mm HG in hypertensive participants with diabetes, heart failure or chronic kidney disease.;Long Term: Maintenance of blood pressure at goal levels.    Lipids Yes    Intervention Provide education and support for participant on nutrition & aerobic/resistive exercise along with prescribed medications to achieve LDL 70mg , HDL >40mg .    Expected Outcomes Short Term: Participant states understanding of desired cholesterol values and is compliant with medications prescribed. Participant is following exercise prescription and nutrition guidelines.;Long Term: Cholesterol controlled with medications as prescribed, with individualized exercise RX and with personalized nutrition plan. Value goals: LDL < 70mg , HDL > 40 mg.           Core Components/Risk Factors/Patient Goals Review:    Core Components/Risk Factors/Patient Goals at Discharge (Final Review):    ITP Comments:  ITP Comments    Row Name 10/02/19 1021           ITP Comments Dr. Fransico Him, Medical Director              Comments: Gillian attended orientation on 10/02/2019 to review  rules and guidelines for program.  Completed 6 minute walk test, Intitial ITP, and exercise prescription.  VSS. Telemetry-Sinus Rhythm.  CBG 207 Asymptomatic. Safety measures and social distancing in place per CDC guidelines.Barnet Pall, RN,BSN 10/02/2019 12:26 PM

## 2019-10-06 ENCOUNTER — Encounter (HOSPITAL_COMMUNITY)
Admission: RE | Admit: 2019-10-06 | Discharge: 2019-10-06 | Disposition: A | Discharge status: Home / Self Care | Payer: Medicare Other | Source: Ambulatory Visit | Attending: Cardiology | Admitting: Cardiology

## 2019-10-06 ENCOUNTER — Other Ambulatory Visit: Payer: Self-pay

## 2019-10-06 DIAGNOSIS — Z941 Heart transplant status: Secondary | ICD-10-CM | POA: Diagnosis not present

## 2019-10-06 LAB — GLUCOSE, CAPILLARY
Glucose-Capillary: 177 mg/dL — ABNORMAL HIGH (ref 70–99)
Glucose-Capillary: 146 mg/dL — ABNORMAL HIGH (ref 70–99)

## 2019-10-06 NOTE — Progress Notes (Signed)
Daily Session Note  Patient Details  Name: Marvin Martin MRN: [id number] Date of Birth: Mar 21, 1965 Referring Provider:     [facility] from 10/02/2019 in [place]  Referring Provider Lattie Haw MD, Dr Fransico Him MD Covering      Encounter Date: 10/06/2019  Check In:  Session Check In - 10/06/19 0710      Check-In   Supervising physician immediately available to respond to emergencies [facility] immediately available    Physician(s) Dr. Lonny Prude    Location [facility]    Staff Present Dorma Russell, MS,ACSM CEP, Exercise Physiologist;Jessica Hassell Done, MS, ACSM-CEP, Exercise Physiologist;Ceil Roderick Rollene Rotunda, RN, Deland Pretty, MS, ACSM CEP, Exercise Physiologist    Virtual Visit No    Medication changes reported     No    Fall or balance concerns reported    No    Tobacco Cessation No Change    Warm-up and Cool-down Performed on first and last piece of equipment    Resistance Training Performed Yes    VAD Patient? No    PAD/SET Patient? No      Pain Assessment   Currently in Pain? No/denies    Pain Score 0-No pain    Multiple Pain Sites No           Capillary Blood Glucose: Results for orders placed or performed during the hospital encounter of 10/06/19 (from the past 24 hour(s))  Glucose, capillary     Status: Abnormal   Collection Time: 10/06/19  7:08 AM  Result Value Ref Range   Glucose-Capillary 177 (H) 70 - 99 mg/dL  Glucose, capillary     Status: Abnormal   Collection Time: 10/06/19  8:05 AM  Result Value Ref Range   Glucose-Capillary 146 (H) 70 - 99 mg/dL      Social History   Tobacco Use  Smoking Status Never Smoker  Smokeless Tobacco Never Used    Goals Met:  Proper associated with RPD/PD & O2 Sat Exercise tolerated well Personal goals reviewed No report of cardiac concerns or symptoms Strength training completed today  Goals Unmet:  Not Applicable  Comments: Pt  started cardiac rehab today.  Pt tolerated light exercise without difficulty. VSS, telemetry-NSR, asymptomatic.  Medication list reconciled. Pt denies barriers to medicaiton compliance.  PSYCHOSOCIAL ASSESSMENT:  PHQ-0. Pt exhibits positive coping skills, hopeful outlook with supportive family. No psychosocial needs identified at this time, no psychosocial interventions necessary.  Pt oriented to exercise equipment and routine. Understanding verbalized.   Dr. Fransico Him is Medical Director for Cardiac Rehab at [facility].

## 2019-10-08 ENCOUNTER — Encounter (HOSPITAL_COMMUNITY)
Admission: RE | Admit: 2019-10-08 | Discharge: 2019-10-08 | Disposition: A | Discharge status: Home / Self Care | Payer: Medicare Other | Source: Ambulatory Visit | Attending: Cardiology | Admitting: Cardiology

## 2019-10-08 ENCOUNTER — Other Ambulatory Visit: Payer: Self-pay

## 2019-10-08 DIAGNOSIS — Z941 Heart transplant status: Secondary | ICD-10-CM | POA: Diagnosis not present

## 2019-10-08 LAB — GLUCOSE, CAPILLARY
Glucose-Capillary: 206 mg/dL — ABNORMAL HIGH (ref 70–99)
Glucose-Capillary: 164 mg/dL — ABNORMAL HIGH (ref 70–99)

## 2019-10-10 ENCOUNTER — Other Ambulatory Visit: Payer: Self-pay

## 2019-10-10 ENCOUNTER — Encounter (HOSPITAL_COMMUNITY)
Admission: RE | Admit: 2019-10-10 | Discharge: 2019-10-10 | Disposition: A | Discharge status: Home / Self Care | Payer: Medicare Other | Source: Ambulatory Visit | Attending: Cardiology | Admitting: Cardiology

## 2019-10-10 DIAGNOSIS — Z941 Heart transplant status: Secondary | ICD-10-CM

## 2019-10-10 LAB — GLUCOSE, CAPILLARY: Glucose-Capillary: 176 mg/dL — ABNORMAL HIGH (ref 70–99)

## 2019-10-10 NOTE — Progress Notes (Signed)
Marvin Martin 54 y.o. male Nutrition Note  Visit Diagnosis: S/P Heart Transplant 12/29/18 - Kidney Transplant 12/30/18   Past Medical History:  Diagnosis Date  . Chronic systolic CHF (congestive heart failure) ([place])    a. 01/2015 Echo: EF 20-25%, antlat, inflat AK.  [name] CKD (chronic kidney disease), stage III   . Coronary artery disease    a. s/p MI x 4;  b. Reported h/o 17 stents with recurrent ISR;  c. 02/2014 Cath ([place]): PCI to unknown vessel;  d. 01/2015 MV: EF 18%, large scar, no ischemia; e. 03/2015 PCI: LM nl, LAD nl, D2 patent stent, RI patent stent, LCX patent stents p/m, RCA 95p ISR (PTCA), 79m, 100d into RPL CTO, RPDA 90 (2.5x16 Synergy DES).  . Hyperlipidemia   . Hypertensive heart disease   . Ischemic cardiomyopathy    a. s/p SJM ICD 2007 w/ gen change in 2012; b. 12/2014 CPX Test: mild to mod HF limitation w/ pVO2 20.3 and nl slope;  c. 01/2015 Echo: EF 20-25%.     Medications reviewed.   Current Outpatient Medications:  .  amLODipine (NORVASC) 10 MG tablet, Take 10 mg by mouth daily., Disp: , Rfl:  .  aspirin 81 MG tablet, Take 81 mg by mouth daily., Disp: , Rfl:  .  Calcium Carbonate-Vitamin D 600-400 MG-UNIT tablet, Take 1 tablet by mouth daily., Disp: , Rfl:  .  carvedilol (COREG) 12.5 MG tablet, Take 12.5 mg by mouth 2 (two) times daily., Disp: , Rfl:  .  cholecalciferol (VITAMIN D) 1000 UNITS tablet, Take 1,000 Units by mouth daily., Disp: , Rfl:  .  Continuous Blood Gluc Receiver (FREESTYLE LIBRE 2 READER) DEVI, 3 Units by Does not apply route in the morning, at noon, and at bedtime., Disp: 1 each, Rfl: 4 .  Continuous Blood Gluc Sensor (FREESTYLE LIBRE 2 SENSOR) MISC, 3 Units by Does not apply route in the morning, at noon, and at bedtime., Disp: 1 each, Rfl: 4 .  doxazosin (CARDURA) 2 MG tablet, Take 2 mg by mouth at bedtime., Disp: , Rfl:  .  ezetimibe (ZETIA) 10 MG tablet, Take 1 tablet (10 mg total) by mouth daily. Needs appt for further refills, Disp: 90 tablet,  Rfl: 0 .  HYDROcodone-acetaminophen (NORCO/VICODIN) 5-325 MG tablet, Take 1 tablet by mouth every 4 (four) hours as needed., Disp: 10 tablet, Rfl: 0 .  insulin aspart (NOVOLOG FLEXPEN) 100 UNIT/ML FlexPen, Inject 0-12 Units into the skin 3 (three) times daily with meals. Per sliding scale, Disp: 30 mL, Rfl: 6 .  insulin glargine (LANTUS SOLOSTAR) 100 UNIT/ML Solostar Pen, Inject 20 Units into the skin daily., Disp: 30 mL, Rfl: 6 .  Insulin Pen Needle 32G X 4 MM MISC, 1 application by Does not apply route 4 (four) times daily., Disp: 200 each, Rfl: 5 .  isosorbide mononitrate (IMDUR) 30 MG 24 hr tablet, TAKE 1 TABLET BY MOUTH EVERY DAY, Disp: 90 tablet, Rfl: 3 .  magnesium oxide (MAG-OX) 400 (241.3 Mg) MG tablet, Take 400 mg by mouth 2 (two) times daily. , Disp: , Rfl:  .  mycophenolate (CELLCEPT) 500 MG tablet, Take 1,500 mg by mouth in the morning and at bedtime., Disp: , Rfl:  .  nitroGLYCERIN (NITROSTAT) 0.4 MG SL tablet, Place 1 tablet (0.4 mg total) under the tongue every 5 (five) minutes as needed for chest pain. Reported on 04/12/2015, Disp: 60 tablet, Rfl: 1 .  nystatin (MYCOSTATIN) 100000 UNIT/ML suspension, Take 5 mLs by mouth See admin instructions.  Swish and swallow 5 ml's by mouth four times a day, Disp: , Rfl:  .  omega-3 acid ethyl esters (LOVAZA) 1 G capsule, Take 1 g by mouth daily. , Disp: , Rfl:  .  ondansetron (ZOFRAN) 4 MG tablet, Take 1 tablet (4 mg total) every 8 (eight) hours as needed by mouth for nausea or vomiting. Reported on 05/24/2015, Disp: 30 tablet, Rfl: 1 .  pantoprazole (PROTONIX) 40 MG tablet, Take 1 tablet by mouth at bedtime. , Disp: , Rfl:  .  PRALUENT 75 MG/ML SOAJ, INJECT 75 MG INTO THE SKIN EVERY 14 (FOURTEEN) DAYS., Disp: 6 pen, Rfl: 1 .  predniSONE (DELTASONE) 5 MG tablet, Take 1 tablet by mouth in the morning. , Disp: , Rfl:  .  tacrolimus (PROGRAF) 1 MG capsule, Take 3-4 mg by mouth See admin instructions. Take 4 mg by mouth in the morning and 3 mg in the  evening, Disp: , Rfl:    Ht Readings from Last 1 Encounters:  10/02/19 5' 7.5" (1.715 m)     Wt Readings from Last 3 Encounters:  10/02/19 145 lb 8.1 oz (66 kg)  08/11/19 140 lb (63.5 kg)  07/30/19 137 lb 2 oz (62.2 kg)     There is no height or weight on file to calculate BMI.   Social History   Tobacco Use  Smoking Status Never Smoker  Smokeless Tobacco Never Used     Lab Results  Component Value Date   CHOL 88 (L) 08/21/2016   Lab Results  Component Value Date   HDL 47 08/21/2016   Lab Results  Component Value Date   LDLCALC 23 08/21/2016   Lab Results  Component Value Date   TRIG 92 08/21/2016     Lab Results  Component Value Date   HGBA1C 13.7 (H) 07/29/2019     CBG (last 3)  Recent Labs    10/08/19 0709 10/08/19 0804  GLUCAP 206* 164*     Nutrition Note  Spoke with pt. Nutrition Plan and Nutrition Survey goals reviewed with pt. Pt is following a Heart Healthy diet.  He lost 20-25 lbs after his transplant surgery. He had a poor appetite, now resolved. He is eating well. He wants to gain weight. He is eating 3 meals per day. He tries to avoid fried foods and chooses whole grains and fruits often. No sugary beverages. He does not eat an excessive amount of carbs at meals or snacks based on diet recall.   Pt has Type 2 Diabetes. Per EMR and pt, he developed diabetes as complication of transplant surgery. Pt checks CBG's 3 times a day. Fasting CBG's reportedly 210-347 mg/dL. Afternoon is around 285 mg/dl and bedtime is 305-315 mg/dl.  He states taking 20 units lantus at night and 5 units of novolog for most meals. His vision has gotten blurry and he is unable to read labels.   Per discussion, pt does not use canned/convenience foods often. Pt does not add salt to food. Pt does not eat out frequently.   Pt expressed understanding of the information reviewed.   Nutrition Diagnosis ? Excessive carbohydrate intake related to food preferences and lack of  food related knowledge as evidenced by A1C 13.7 and CBGs >200 mg/dl all day.     Nutrition Intervention ? Pt's individual nutrition plan reviewed with pt. ? Benefits of adopting Heart Healthy diet discussed when Medficts reviewed.   ? Continue client-centered nutrition education by RD, as part of interdisciplinary care.  Goal(s) ?  CBG concentrations in the normal range or as close to normal as is safely possible. ? Improved blood glucose control as evidenced by pt's A1c trending from 13.4 toward less than 7.0.   Plan:   Will provide client-centered nutrition education as part of interdisciplinary care  Monitor and evaluate progress toward nutrition goal with team.   Michaele Offer, MS, RDN, LDN

## 2019-10-13 ENCOUNTER — Encounter (HOSPITAL_COMMUNITY)
Admission: RE | Admit: 2019-10-13 | Discharge: 2019-10-13 | Disposition: A | Discharge status: Home / Self Care | Payer: Medicare Other | Source: Ambulatory Visit | Attending: Cardiology | Admitting: Cardiology

## 2019-10-13 ENCOUNTER — Other Ambulatory Visit: Payer: Self-pay

## 2019-10-13 DIAGNOSIS — Z941 Heart transplant status: Secondary | ICD-10-CM

## 2019-10-13 NOTE — Progress Notes (Signed)
Reviewed home exercise guidelines with patient including endpoints, temperature precautions, target heart rate and rate of perceived exertion. Patient is currently walking 20-30 minutes, at least 3 days/week as his mode of home exercise. Discussed increasing duration to 30 minutes consistently, and patient is amenable to this. Reviewed how to count pulse. Patient voices understanding of instructions given.  Sol Passer, MS, ACSM CEP

## 2019-10-14 NOTE — Progress Notes (Signed)
Cardiac Individual Treatment Plan  Patient Details  Name: Marvin Martin MRN: [id number] Date of Birth: 07/20/1965 Referring Provider:     CARDIAC REHAB PHASE II ORIENTATION from 10/02/2019 in [place]  Referring Provider Lattie Haw MD, Dr Fransico Him MD Covering      Initial Encounter Date:    CARDIAC REHAB [facility] from 10/02/2019 in [place]  Date 10/02/19      Visit Diagnosis: S/P Heart Transplant 12/29/18  Patient's Home Medications on Admission:  Current Outpatient Medications:  .  amLODipine (NORVASC) 10 MG tablet, Take 10 mg by mouth daily., Disp: , Rfl:  .  aspirin 81 MG tablet, Take 81 mg by mouth daily., Disp: , Rfl:  .  Calcium Carbonate-Vitamin D 600-400 MG-UNIT tablet, Take 1 tablet by mouth daily., Disp: , Rfl:  .  carvedilol (COREG) 12.5 MG tablet, Take 12.5 mg by mouth 2 (two) times daily., Disp: , Rfl:  .  cholecalciferol (VITAMIN D) 1000 UNITS tablet, Take 1,000 Units by mouth daily., Disp: , Rfl:  .  Continuous Blood Gluc Receiver (FREESTYLE LIBRE 2 READER) DEVI, 3 Units by Does not apply route in the morning, at noon, and at bedtime., Disp: 1 each, Rfl: 4 .  Continuous Blood Gluc Sensor (FREESTYLE LIBRE 2 SENSOR) MISC, 3 Units by Does not apply route in the morning, at noon, and at bedtime., Disp: 1 each, Rfl: 4 .  doxazosin (CARDURA) 2 MG tablet, Take 2 mg by mouth at bedtime., Disp: , Rfl:  .  ezetimibe (ZETIA) 10 MG tablet, Take 1 tablet (10 mg total) by mouth daily. Needs appt for further refills, Disp: 90 tablet, Rfl: 0 .  HYDROcodone-acetaminophen (NORCO/VICODIN) 5-325 MG tablet, Take 1 tablet by mouth every 4 (four) hours as needed., Disp: 10 tablet, Rfl: 0 .  insulin aspart (NOVOLOG FLEXPEN) 100 UNIT/ML FlexPen, Inject 0-12 Units into the skin 3 (three) times daily with meals. Per sliding scale, Disp: 30 mL, Rfl: 6 .  insulin glargine (LANTUS SOLOSTAR) 100 UNIT/ML Solostar  Pen, Inject 20 Units into the skin daily., Disp: 30 mL, Rfl: 6 .  Insulin Pen Needle 32G X 4 MM MISC, 1 application by Does not apply route 4 (four) times daily., Disp: 200 each, Rfl: 5 .  isosorbide mononitrate (IMDUR) 30 MG 24 hr tablet, TAKE 1 TABLET BY MOUTH EVERY DAY, Disp: 90 tablet, Rfl: 3 .  magnesium oxide (MAG-OX) 400 (241.3 Mg) MG tablet, Take 400 mg by mouth 2 (two) times daily. , Disp: , Rfl:  .  mycophenolate (CELLCEPT) 500 MG tablet, Take 1,500 mg by mouth in the morning and at bedtime., Disp: , Rfl:  .  nitroGLYCERIN (NITROSTAT) 0.4 MG SL tablet, Place 1 tablet (0.4 mg total) under the tongue every 5 (five) minutes as needed for chest pain. Reported on 04/12/2015, Disp: 60 tablet, Rfl: 1 .  nystatin (MYCOSTATIN) 100000 UNIT/ML suspension, Take 5 mLs by mouth See admin instructions. Swish and swallow 5 ml's by mouth four times a day, Disp: , Rfl:  .  omega-3 acid ethyl esters (LOVAZA) 1 G capsule, Take 1 g by mouth daily. , Disp: , Rfl:  .  ondansetron (ZOFRAN) 4 MG tablet, Take 1 tablet (4 mg total) every 8 (eight) hours as needed by mouth for nausea or vomiting. Reported on 05/24/2015, Disp: 30 tablet, Rfl: 1 .  pantoprazole (PROTONIX) 40 MG tablet, Take 1 tablet by mouth at bedtime. , Disp: , Rfl:  .  PRALUENT 75 MG/ML SOAJ, INJECT 75 MG INTO THE SKIN EVERY 14 (FOURTEEN) DAYS., Disp: 6 pen, Rfl: 1 .  predniSONE (DELTASONE) 5 MG tablet, Take 1 tablet by mouth in the morning. , Disp: , Rfl:  .  tacrolimus (PROGRAF) 1 MG capsule, Take 3-4 mg by mouth See admin instructions. Take 4 mg by mouth in the morning and 3 mg in the evening, Disp: , Rfl:   Past Medical History: Past Medical History:  Diagnosis Date  . Chronic systolic CHF (congestive heart failure) ([place])    a. 01/2015 Echo: EF 20-25%, antlat, inflat AK.  [name] CKD (chronic kidney disease), stage III   . Coronary artery disease    a. s/p MI x 4;  b. Reported h/o 17 stents with recurrent ISR;  c. 02/2014 Cath ([facility]): PCI to unknown  vessel;  d. 01/2015 MV: EF 18%, large scar, no ischemia; e. 03/2015 PCI: LM nl, LAD nl, D2 patent stent, RI patent stent, LCX patent stents p/m, RCA 95p ISR (PTCA), 33m 100d into RPL CTO, RPDA 90 (2.5x16 Synergy DES).  . Hyperlipidemia   . Hypertensive heart disease   . Ischemic cardiomyopathy    a. s/p SJM ICD 2007 w/ gen change in 2012; b. 12/2014 CPX Test: mild to mod HF limitation w/ pVO2 20.3 and nl slope;  c. 01/2015 Echo: EF 20-25%.    Tobacco Use: Social History   Tobacco Use  Smoking Status Never Smoker  Smokeless Tobacco Never Used    Labs: Recent Review Flowsheet Data    Labs for ITP Cardiac and Pulmonary Rehab Latest Ref Rng & Units 09/16/2015 08/21/2016 04/05/2019 07/29/2019 07/29/2019   Cholestrol 100 - 199 mg/dL - 88(L) - - -   LDLCALC 0 - 99 mg/dL - 23 - - -   HDL >39 mg/dL - 47 - - -   Trlycerides 0 - 149 mg/dL - 92 - - -   Hemoglobin A1c 4.8 - 5.6 % - - 14.0(H) 14.3(A) 13.7(H)   PHART 7.35 - 7.45 - - - - -   PCO2ART 35 - 45 mmHg - - - - -   HCO3 20.0 - 28.0 mmol/L - - 25.4 - 25.7   TCO2 22 - 32 mmol/L - - 26 - 27   ACIDBASEDEF 0.0 - 2.0 mmol/L - - - - -   O2SAT % 63.1 - 97.0 - 99.0      Capillary Blood Glucose: Lab Results  Component Value Date   GLUCAP 176 (H) 10/10/2019   GLUCAP 164 (H) 10/08/2019   GLUCAP 206 (H) 10/08/2019   GLUCAP 146 (H) 10/06/2019   GLUCAP 177 (H) 10/06/2019     Exercise Target Goals: Exercise Program Goal: Individual exercise prescription set using results from initial 6 min walk test and THRR while considering  patient's activity barriers and safety.   Exercise Prescription Goal: Initial exercise prescription builds to 30-45 minutes a day of aerobic activity, 2-3 days per week.  Home exercise guidelines will be given to patient during program as part of exercise prescription that the participant will acknowledge.  Activity Barriers & Risk Stratification:  Activity Barriers & Cardiac Risk Stratification - 10/02/19 1033       Activity Barriers & Cardiac Risk Stratification   Activity Barriers None    Cardiac Risk Stratification High           6 Minute Walk:  6 Minute Walk    Row Name 10/02/19 1031         6 Minute  Walk   Phase Initial     Distance 1406 feet     Walk Time 6 minutes     # of Rest Breaks 0     MPH 2.7     METS 4.2     RPE 11     Perceived Dyspnea  0     VO2 Peak 14.6     Symptoms No     Resting HR 89 bpm     Resting BP 122/80     Resting Oxygen Saturation  99 %     Exercise Oxygen Saturation  during 6 min walk 100 %     Max Ex. HR 92 bpm     Max Ex. BP 130/82     2 Minute Post BP 114/76            Oxygen Initial Assessment:   Oxygen Re-Evaluation:   Oxygen Discharge (Final Oxygen Re-Evaluation):   Initial Exercise Prescription:  Initial Exercise Prescription - 10/02/19 1000      Date of Initial Exercise RX and Referring Provider   Date 10/02/19    Referring Provider Lattie Haw MD, Dr Fransico Him MD Covering    Expected Discharge Date 11/28/19      Treadmill   MPH 2.4    Grade 1    Minutes 15    METs 3.17      NuStep   Level 2    SPM 80    Minutes 15    METs 2.5      Prescription Details   Frequency (times per week) 3x    Duration Progress to 30 minutes of continuous aerobic without signs/symptoms of physical distress      Intensity   THRR 40-80% of Max Heartrate 66-133    Ratings of Perceived Exertion 11-13    Perceived Dyspnea 0-4      Progression   Progression Continue progressive overload as per policy without signs/symptoms or physical distress.      Resistance Training   Training Prescription Yes    Weight 3lbs    Reps 10-15           Perform Capillary Blood Glucose checks as needed.  Exercise Prescription Changes:   Exercise Prescription Changes    Row Name 10/06/19 0706             Response to Exercise   Blood Pressure (Admit) 130/84       Blood Pressure (Exercise) 124/76       Blood Pressure (Exit) 100/70        Heart Rate (Admit) 84 bpm       Heart Rate (Exercise) 105 bpm       Heart Rate (Exit) 94 bpm       Rating of Perceived Exertion (Exercise) 12       Symptoms none       Comments Off to a good start with exercise.       Duration Continue with 30 min of aerobic exercise without signs/symptoms of physical distress.       Intensity THRR unchanged         Progression   Progression Continue to progress workloads to maintain intensity without signs/symptoms of physical distress.       Average METs 3.1         Resistance Training   Training Prescription Yes       Weight 3lbs       Reps 10-15       Time 10 Minutes  Interval Training   Interval Training No         Treadmill   MPH 2.4       Grade 1       Minutes 15       METs 3.17         NuStep   Level 2       SPM 80       Minutes 15       METs 3              Exercise Comments:   Exercise Comments    Row Name 10/06/19 0807 10/08/19 0720 10/13/19 0713       Exercise Comments Patient tolerated low intensity exercise well without symptoms. Reviewed METs with patient. Reviewed home exercise guidelines with patient.            Exercise Goals and Review:   Exercise Goals    Row Name 10/02/19 1033             Exercise Goals   Increase Physical Activity Yes       Intervention Provide advice, education, support and counseling about physical activity/exercise needs.;Develop an individualized exercise prescription for aerobic and resistive training based on initial evaluation findings, risk stratification, comorbidities and participant's personal goals.       Expected Outcomes Short Term: Attend rehab on a regular basis to increase amount of physical activity.;Long Term: Add in home exercise to make exercise part of routine and to increase amount of physical activity.;Long Term: Exercising regularly at least 3-5 days a week.       Increase Strength and Stamina Yes       Intervention Provide advice, education, support  and counseling about physical activity/exercise needs.;Develop an individualized exercise prescription for aerobic and resistive training based on initial evaluation findings, risk stratification, comorbidities and participant's personal goals.       Expected Outcomes Short Term: Increase workloads from initial exercise prescription for resistance, speed, and METs.;Short Term: Perform resistance training exercises routinely during rehab and add in resistance training at home;Long Term: Improve cardiorespiratory fitness, muscular endurance and strength as measured by increased METs and functional capacity (6MWT)       Able to understand and use rate of perceived exertion (RPE) scale Yes       Intervention Provide education and explanation on how to use RPE scale       Expected Outcomes Short Term: Able to use RPE daily in rehab to express subjective intensity level;Long Term:  Able to use RPE to guide intensity level when exercising independently       Knowledge and understanding of Target Heart Rate Range (THRR) Yes       Intervention Provide education and explanation of THRR including how the numbers were predicted and where they are located for reference       Expected Outcomes Short Term: Able to state/look up THRR;Long Term: Able to use THRR to govern intensity when exercising independently;Short Term: Able to use daily as guideline for intensity in rehab       Able to check pulse independently Yes       Intervention Provide education and demonstration on how to check pulse in carotid and radial arteries.;Review the importance of being able to check your own pulse for safety during independent exercise       Expected Outcomes Short Term: Able to explain why pulse checking is important during independent exercise;Long Term: Able to check pulse independently and accurately  Understanding of Exercise Prescription Yes       Intervention Provide education, explanation, and written materials on  patient's individual exercise prescription       Expected Outcomes Short Term: Able to explain program exercise prescription;Long Term: Able to explain home exercise prescription to exercise independently              Exercise Goals Re-Evaluation :  Exercise Goals Re-Evaluation    Row Name 10/06/19 0807 10/13/19 0713           Exercise Goal Re-Evaluation   Exercise Goals Review Increase Physical Activity;Able to understand and use rate of perceived exertion (RPE) scale Increase Physical Activity;Able to understand and use rate of perceived exertion (RPE) scale;Understanding of Exercise Prescription;Increase Strength and Stamina;Knowledge and understanding of Target Heart Rate Range (THRR)      Comments Patient able to understand and use RPE scale appropriately. Reviewed home exercise guidelines with patient including endpoints, temperature precautions, target heart rate and rate of perceived exertion. Patient is currently walking 20-30 minutes, at least 3 days/week as his mode of home exercise. Discussed increasing duration to 30 minutes consistently, and patient is amenable to this. Reviewed how to count pulse.      Expected Outcomes Progress workloads as tolerated to help achieve personal health and fitness goals. Patient will continue walking at least 3 days/week and will increase duration to 30 minutes consistently.             Discharge Exercise Prescription (Final Exercise Prescription Changes):  Exercise Prescription Changes - 10/06/19 0706      Response to Exercise   Blood Pressure (Admit) 130/84    Blood Pressure (Exercise) 124/76    Blood Pressure (Exit) 100/70    Heart Rate (Admit) 84 bpm    Heart Rate (Exercise) 105 bpm    Heart Rate (Exit) 94 bpm    Rating of Perceived Exertion (Exercise) 12    Symptoms none    Comments Off to a good start with exercise.    Duration Continue with 30 min of aerobic exercise without signs/symptoms of physical distress.    Intensity  THRR unchanged      Progression   Progression Continue to progress workloads to maintain intensity without signs/symptoms of physical distress.    Average METs 3.1      Resistance Training   Training Prescription Yes    Weight 3lbs    Reps 10-15    Time 10 Minutes      Interval Training   Interval Training No      Treadmill   MPH 2.4    Grade 1    Minutes 15    METs 3.17      NuStep   Level 2    SPM 80    Minutes 15    METs 3           Nutrition:  Target Goals: Understanding of nutrition guidelines, daily intake of sodium <1563m, cholesterol <2069m calories 30% from fat and 7% or less from saturated fats, daily to have 5 or more servings of fruits and vegetables.  Biometrics:  Pre Biometrics - 10/02/19 1032      Pre Biometrics   Height 5' 7.5" (1.715 m)    Weight 66 kg    Waist Circumference 33.5 inches    Hip Circumference 36 inches    Waist to Hip Ratio 0.93 %    BMI (Calculated) 22.44    Triceps Skinfold 7 mm    % Body Fat  19.1 %    Grip Strength 43 kg    Flexibility 13.5 in    Single Leg Stand 18.37 seconds            Nutrition Therapy Plan and Nutrition Goals:  Nutrition Therapy & Goals - 10/10/19 1329      Nutrition Therapy   Diet Heart healthy/carb modified      Personal Nutrition Goals   Nutrition Goal Improved blood glucose control as evidenced by pt's A1c trending from 13.4 toward less than 7.0.    Personal Goal #2 CBG concentrations in the normal range or as close to normal as is safely possible.      Intervention Plan   Intervention Prescribe, educate and counsel regarding individualized specific dietary modifications aiming towards targeted core components such as weight, hypertension, lipid management, diabetes, heart failure and other comorbidities.    Expected Outcomes Short Term Goal: A plan has been developed with personal nutrition goals set during dietitian appointment.;Long Term Goal: Adherence to prescribed nutrition plan.            Nutrition Assessments:   Nutrition Goals Re-Evaluation:  Nutrition Goals Re-Evaluation    Marvin Martin Name 10/10/19 1330             Goals   Current Weight 145 lb (65.8 kg)       Nutrition Goal Improved blood glucose control as evidenced by pt's A1c trending from 13.4 toward less than 7.0.         Personal Goal #2 Re-Evaluation   Personal Goal #2 CBG concentrations in the normal range or as close to normal as is safely possible.              Nutrition Goals Re-Evaluation:  Nutrition Goals Re-Evaluation    Marvin Martin Name 10/10/19 1330             Goals   Current Weight 145 lb (65.8 kg)       Nutrition Goal Improved blood glucose control as evidenced by pt's A1c trending from 13.4 toward less than 7.0.         Personal Goal #2 Re-Evaluation   Personal Goal #2 CBG concentrations in the normal range or as close to normal as is safely possible.              Nutrition Goals Discharge (Final Nutrition Goals Re-Evaluation):  Nutrition Goals Re-Evaluation - 10/10/19 1330      Goals   Current Weight 145 lb (65.8 kg)    Nutrition Goal Improved blood glucose control as evidenced by pt's A1c trending from 13.4 toward less than 7.0.      Personal Goal #2 Re-Evaluation   Personal Goal #2 CBG concentrations in the normal range or as close to normal as is safely possible.           Psychosocial: Target Goals: Acknowledge presence or absence of significant depression and/or stress, maximize coping skills, provide positive support system. Participant is able to verbalize types and ability to use techniques and skills needed for reducing stress and depression.  Initial Review & Psychosocial Screening:  Initial Psych Review & Screening - 10/02/19 1221      Initial Review   Current issues with None Identified      Family Dynamics   Good Support System? Yes   Marvin Martin lives alone. Marvin Martin has his brother and sister who live near by for support     Barriers   Psychosocial  barriers to participate in program There are no identifiable  barriers or psychosocial needs.      Screening Interventions   Interventions Encouraged to exercise           Quality of Life Scores:  Quality of Life - 10/02/19 1029      Quality of Life   Select Quality of Life      Quality of Life Scores   Health/Function Pre 28.47 %    Socioeconomic Pre 26.69 %    Psych/Spiritual Pre 30 %    Family Pre 27.4 %    GLOBAL Pre 28.21 %          Scores of 19 and below usually indicate a poorer quality of life in these areas.  A difference of  2-3 points is a clinically meaningful difference.  A difference of 2-3 points in the total score of the Quality of Life Index has been associated with significant improvement in overall quality of life, self-image, physical symptoms, and general health in studies assessing change in quality of life.  PHQ-9: Recent Review Flowsheet Data    Depression screen [facility] 2/9 10/02/2019 08/11/2019 07/29/2019 01/07/2018 08/03/2015   Decreased Interest 0 - 0 0 0   Down, Depressed, Hopeless 0 0 0 0 0   PHQ - 2 Score 0 0 0 0 0   Altered sleeping - 0 0 0 -   Tired, decreased energy - 1 2 0 -   Change in appetite - 0 0 0 -   Feeling bad or failure about yourself  - 0 0 0 -   Trouble concentrating - 0 0 0 -   Moving slowly or fidgety/restless - 0 0 0 -   Suicidal thoughts - 0 0 0 -   PHQ-9 Score - 1 2 0 -     Interpretation of Total Score  Total Score Depression Severity:  1-4 = Minimal depression, 5-9 = Mild depression, 10-14 = Moderate depression, 15-19 = Moderately severe depression, 20-27 = Severe depression   Psychosocial Evaluation and Intervention:  Psychosocial Evaluation - 10/06/19 1102      Psychosocial Evaluation & Interventions   Interventions Encouraged to exercise with the program and follow exercise prescription    Comments Marvin Martin presents to cardiac rehab with a positive attitude and outlook. He is eager to participate in the program.  Admits to having a strong support system including his siblings, children, extended family and friends. He is separated from his wife however they have a good relationship. His "hobbies" are relaxing and watching TV. He denies stress at this time. No interventions needed.    Expected Outcomes Patient will continue to have a positive attitude and outlook.    Continue Psychosocial Services  No Follow up required           Psychosocial Re-Evaluation:  Psychosocial Re-Evaluation    [place] Name 10/14/19 [phone]             Psychosocial Re-Evaluation   Current issues with None Identified       Comments Marvin Martin continue to present to his cardiac rehab exercise sessions with a positive outlook and attitude. He enjoys conversating with staff and classmates. He willingly shares stories of his children. Admits to a strong support system including family and friends. He is separated from his wife but they have a good relationship and she remians supportive. No interventions needed at this time.       Expected Outcomes patient will continue to have a positive attitude and outlook. He will utilize his support  system as needed.       Interventions Encouraged to attend Cardiac Rehabilitation for the exercise       Continue Psychosocial Services  No Follow up required              Psychosocial Discharge (Final Psychosocial Re-Evaluation):  Psychosocial Re-Evaluation - 10/14/19 0724      Psychosocial Re-Evaluation   Current issues with None Identified    Comments Marvin Martin continue to present to his cardiac rehab exercise sessions with a positive outlook and attitude. He enjoys conversating with staff and classmates. He willingly shares stories of his children. Admits to a strong support system including family and friends. He is separated from his wife but they have a good relationship and she remians supportive. No interventions needed at this time.    Expected Outcomes patient will continue to  have a positive attitude and outlook. He will utilize his support system as needed.    Interventions Encouraged to attend Cardiac Rehabilitation for the exercise    Continue Psychosocial Services  No Follow up required           Vocational Rehabilitation: Provide vocational rehab assistance to qualifying candidates.   Vocational Rehab Evaluation & Intervention:  Vocational Rehab - 10/02/19 1222      Initial Vocational Rehab Evaluation & Intervention   Assessment shows need for Vocational Rehabilitation No           Education: Education Goals: Education classes will be provided on a weekly basis, covering required topics. Participant will state understanding/return demonstration of topics presented.  Learning Barriers/Preferences:  Learning Barriers/Preferences - 10/02/19 1029      Learning Barriers/Preferences   Learning Barriers None    Learning Preferences Written Material;Group Instruction;Individual Instruction           Education Topics: Count Your Pulse:  -Group instruction provided by verbal instruction, demonstration, patient participation and written materials to support subject.  Instructors address importance of being able to find your pulse and how to count your pulse when at home without a heart monitor.  Patients get hands on experience counting their pulse with staff help and individually.   Heart Attack, Angina, and Risk Factor Modification:  -Group instruction provided by verbal instruction, video, and written materials to support subject.  Instructors address signs and symptoms of angina and heart attacks.    Also discuss risk factors for heart disease and how to make changes to improve heart health risk factors.   Functional Fitness:  -Group instruction provided by verbal instruction, demonstration, patient participation, and written materials to support subject.  Instructors address safety measures for doing things around the house.  Discuss how to get  up and down off the floor, how to pick things up properly, how to safely get out of a chair without assistance, and balance training.   Meditation and Mindfulness:  -Group instruction provided by verbal instruction, patient participation, and written materials to support subject.  Instructor addresses importance of mindfulness and meditation practice to help reduce stress and improve awareness.  Instructor also leads participants through a meditation exercise.    Stretching for Flexibility and Mobility:  -Group instruction provided by verbal instruction, patient participation, and written materials to support subject.  Instructors lead participants through series of stretches that are designed to increase flexibility thus improving mobility.  These stretches are additional exercise for major muscle groups that are typically performed during regular warm up and cool down.   Hands Only CPR:  -Group verbal, video,  and participation provides a basic overview of AHA guidelines for community CPR. Role-play of emergencies allow participants the opportunity to practice calling for help and chest compression technique with discussion of AED use.   Hypertension: -Group verbal and written instruction that provides a basic overview of hypertension including the most recent diagnostic guidelines, risk factor reduction with self-care instructions and medication management.    Nutrition I class: Heart Healthy Eating:  -Group instruction provided by PowerPoint slides, verbal discussion, and written materials to support subject matter. The instructor gives an explanation and review of the Therapeutic Lifestyle Changes diet recommendations, which includes a discussion on lipid goals, dietary fat, sodium, fiber, plant stanol/sterol esters, sugar, and the components of a well-balanced, healthy diet.   Nutrition II class: Lifestyle Skills:  -Group instruction provided by PowerPoint slides, verbal discussion, and  written materials to support subject matter. The instructor gives an explanation and review of label reading, grocery shopping for heart health, heart healthy recipe modifications, and ways to make healthier choices when eating out.   Diabetes Question & Answer:  -Group instruction provided by PowerPoint slides, verbal discussion, and written materials to support subject matter. The instructor gives an explanation and review of diabetes co-morbidities, pre- and post-prandial blood glucose goals, pre-exercise blood glucose goals, signs, symptoms, and treatment of hypoglycemia and hyperglycemia, and foot care basics.   Diabetes Blitz:  -Group instruction provided by PowerPoint slides, verbal discussion, and written materials to support subject matter. The instructor gives an explanation and review of the physiology behind type 1 and type 2 diabetes, diabetes medications and rational behind using different medications, pre- and post-prandial blood glucose recommendations and Hemoglobin A1c goals, diabetes diet, and exercise including blood glucose guidelines for exercising safely.    Portion Distortion:  -Group instruction provided by PowerPoint slides, verbal discussion, written materials, and food models to support subject matter. The instructor gives an explanation of serving size versus portion size, changes in portions sizes over the last 20 years, and what consists of a serving from each food group.   Stress Management:  -Group instruction provided by verbal instruction, video, and written materials to support subject matter.  Instructors review role of stress in heart disease and how to cope with stress positively.     Exercising on Your Own:  -Group instruction provided by verbal instruction, power point, and written materials to support subject.  Instructors discuss benefits of exercise, components of exercise, frequency and intensity of exercise, and end points for exercise.  Also discuss  use of nitroglycerin and activating EMS.  Review options of places to exercise outside of rehab.  Review guidelines for sex with heart disease.   Cardiac Drugs I:  -Group instruction provided by verbal instruction and written materials to support subject.  Instructor reviews cardiac drug classes: antiplatelets, anticoagulants, beta blockers, and statins.  Instructor discusses reasons, side effects, and lifestyle considerations for each drug class.   Cardiac Drugs II:  -Group instruction provided by verbal instruction and written materials to support subject.  Instructor reviews cardiac drug classes: angiotensin converting enzyme inhibitors (ACE-I), angiotensin II receptor blockers (ARBs), nitrates, and calcium channel blockers.  Instructor discusses reasons, side effects, and lifestyle considerations for each drug class.   Anatomy and Physiology of the Circulatory System:  Group verbal and written instruction and models provide basic cardiac anatomy and physiology, with the coronary electrical and arterial systems. Review of: AMI, Angina, Valve disease, Heart Failure, Peripheral Artery Disease, Cardiac Arrhythmia, Pacemakers, and the ICD.   Other  Education:  -Group or individual verbal, written, or video instructions that support the educational goals of the cardiac rehab program.   Holiday Eating Survival Tips:  -Group instruction provided by PowerPoint slides, verbal discussion, and written materials to support subject matter. The instructor gives patients tips, tricks, and techniques to help them not only survive but enjoy the holidays despite the onslaught of food that accompanies the holidays.   Knowledge Questionnaire Score:  Knowledge Questionnaire Score - 10/02/19 1029      Knowledge Questionnaire Score   Pre Score 17/24           Core Components/Risk Factors/Patient Goals at Admission:  Personal Goals and Risk Factors at Admission - 10/02/19 1222      Core Components/Risk  Factors/Patient Goals on Admission    Weight Management Yes;Weight Gain;Weight Maintenance    Intervention Weight Management: Develop a combined nutrition and exercise program designed to reach desired caloric intake, while maintaining appropriate intake of nutrient and fiber, sodium and fats, and appropriate energy expenditure required for the weight goal.;Weight Management/Obesity: Establish reasonable short term and long term weight goals.;Weight Management: Provide education and appropriate resources to help participant work on and attain dietary goals.    Admit Weight 145 lb 8.1 oz (66 kg)    Goal Weight: Long Term 155 lb (70.3 kg)    Expected Outcomes Weight Gain: Understanding of general recommendations for a high calorie, high protein meal plan that promotes weight gain by distributing calorie intake throughout the day with the consumption for 4-5 meals, snacks, and/or supplements;Understanding of distribution of calorie intake throughout the day with the consumption of 4-5 meals/snacks;Understanding recommendations for meals to include 15-35% energy as protein, 25-35% energy from fat, 35-60% energy from carbohydrates, less than 25m of dietary cholesterol, 20-35 gm of total fiber daily;Short Term: Continue to assess and modify interventions until short term weight is achieved;Long Term: Adherence to nutrition and physical activity/exercise program aimed toward attainment of established weight goal    Diabetes Yes    Intervention Provide education about signs/symptoms and action to take for hypo/hyperglycemia.;Provide education about proper nutrition, including hydration, and aerobic/resistive exercise prescription along with prescribed medications to achieve blood glucose in normal ranges: Fasting glucose 65-99 mg/dL    Expected Outcomes Short Term: Participant verbalizes understanding of the signs/symptoms and immediate care of hyper/hypoglycemia, proper foot care and importance of medication,  aerobic/resistive exercise and nutrition plan for blood glucose control.;Long Term: Attainment of HbA1C < 7%.    Heart Failure Yes    Intervention Provide a combined exercise and nutrition program that is supplemented with education, support and counseling about heart failure. Directed toward relieving symptoms such as shortness of breath, decreased exercise tolerance, and extremity edema.    Expected Outcomes Improve functional capacity of life;Short term: Attendance in program 2-3 days a week with increased exercise capacity. Reported lower sodium intake. Reported increased fruit and vegetable intake. Reports medication compliance.;Short term: Daily weights obtained and reported for increase. Utilizing diuretic protocols set by physician.;Long term: Adoption of self-care skills and reduction of barriers for early signs and symptoms recognition and intervention leading to self-care maintenance.    Hypertension Yes    Intervention Provide education on lifestyle modifcations including regular physical activity/exercise, weight management, moderate sodium restriction and increased consumption of fresh fruit, vegetables, and low fat dairy, alcohol moderation, and smoking cessation.;Monitor prescription use compliance.    Expected Outcomes Short Term: Continued assessment and intervention until BP is < 140/918mHG in hypertensive participants. < 130/8093mG  in hypertensive participants with diabetes, heart failure or chronic kidney disease.;Long Term: Maintenance of blood pressure at goal levels.    Lipids Yes    Intervention Provide education and support for participant on nutrition & aerobic/resistive exercise along with prescribed medications to achieve LDL <58m, HDL >424m    Expected Outcomes Short Term: Participant states understanding of desired cholesterol values and is compliant with medications prescribed. Participant is following exercise prescription and nutrition guidelines.;Long Term: Cholesterol  controlled with medications as prescribed, with individualized exercise RX and with personalized nutrition plan. Value goals: LDL < 7020mHDL > 40 mg.           Core Components/Risk Factors/Patient Goals Review:   Goals and Risk Factor Review    Row Name 10/06/19 1105 10/14/19 0726           Core Components/Risk Factors/Patient Goals Review   Personal Goals Review Weight Management/Obesity;Lipids;Diabetes;Hypertension Weight Management/Obesity;Lipids;Diabetes;Hypertension      Review Marvin Martin multiple CAD risk factors however 12/2018 he recieved a heart transplant and renal transplant. His lipids and hypertension are managed by medications. His most recent post transplant A1C is 13.7. His goals are to increase his weight to 158-160 lbs and increase his stamina and strength Marvin Martin multiple CAD risk factors however 12/2018 he recieved a heart transplant and renal transplant. His lipids and hypertension are managed by medications. His most recent post transplant A1C is 13.7. His goals are to increase his weight to 158-160 lbs and increase his stamina and strength      Expected Outcomes Patient will continue to particpate in CR for education and risk factor reductions. Patient will continue to particpate in CR for education and risk factor reductions.             Core Components/Risk Factors/Patient Goals at Discharge (Final Review):   Goals and Risk Factor Review - 10/14/19 0726      Core Components/Risk Factors/Patient Goals Review   Personal Goals Review Weight Management/Obesity;Lipids;Diabetes;Hypertension    Review Mr. CovCordreys multiple CAD risk factors however 12/2018 he recieved a heart transplant and renal transplant. His lipids and hypertension are managed by medications. His most recent post transplant A1C is 13.7. His goals are to increase his weight to 158-160 lbs and increase his stamina and strength    Expected Outcomes Patient will continue to  particpate in CR for education and risk factor reductions.           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 Worked to an RPE of 11-13. VSS. Complaint of mild arm discomfort while using NuStep. Discomfort relieved with intermittant resting of the arms. No cardiac complaints. Personal goals reviewed. Patient hopes to increase his stamina and strength and increase weight to 158-160 lbs. 30 day ITP review: Marvin Martin completed 4 cardiac rehab exercise sessions since his orientation 10/02/19. VS remain stable. Marvin Martin to an RPE of 11-12. States he is "really enjoying" cardiac rehab. Admits to bilat arm soreness during first session but today states that soreness has resolved and he feels it is because he is increasing his muscle strength. Marvin Martin are to increase his stamina and strength and to gain weight he lost during transplant. He has met with RD and will continue to intermittant consultations  to assure patient is on track with healthy weight gain. Expect to see greater progression towards goals over the next 30 days.            Comments: see ITP comments

## 2019-10-15 ENCOUNTER — Other Ambulatory Visit: Payer: Self-pay

## 2019-10-15 ENCOUNTER — Encounter (HOSPITAL_COMMUNITY)
Admission: RE | Admit: 2019-10-15 | Discharge: 2019-10-15 | Disposition: A | Discharge status: Home / Self Care | Payer: Medicare Other | Source: Ambulatory Visit | Attending: Cardiology | Admitting: Cardiology

## 2019-10-15 DIAGNOSIS — Z941 Heart transplant status: Secondary | ICD-10-CM

## 2019-10-17 ENCOUNTER — Encounter (HOSPITAL_COMMUNITY)
Admission: RE | Admit: 2019-10-17 | Discharge: 2019-10-17 | Disposition: A | Discharge status: Home / Self Care | Payer: Medicare Other | Source: Ambulatory Visit | Attending: Cardiology | Admitting: Cardiology

## 2019-10-17 ENCOUNTER — Other Ambulatory Visit: Payer: Self-pay

## 2019-10-17 DIAGNOSIS — Z941 Heart transplant status: Secondary | ICD-10-CM | POA: Diagnosis not present

## 2019-10-20 ENCOUNTER — Encounter (HOSPITAL_COMMUNITY)
Admission: RE | Admit: 2019-10-20 | Discharge: 2019-10-20 | Disposition: A | Discharge status: Home / Self Care | Payer: Medicare Other | Source: Ambulatory Visit | Attending: Cardiology | Admitting: Cardiology

## 2019-10-20 ENCOUNTER — Other Ambulatory Visit: Payer: Self-pay

## 2019-10-20 DIAGNOSIS — Z941 Heart transplant status: Secondary | ICD-10-CM | POA: Diagnosis not present

## 2019-10-20 NOTE — Progress Notes (Signed)
Nutrition Note - Follow up  Spoke with pt today about carbohydrate counting, and eating a consistent amount of carbohydrates across the day. Reviewed glycemic targets. Reviewed the benefits of carbohydrate counting and eating a consistent amount of carbohydrates across the day. Showed pt how to calculate carbohydrate servings, and distributed handouts for patient to practice. Recommended pt eat 3-4 servings of carbohydrates at meals (60 grams or less). Discussed the importance of creating a balanced meal with the addition of protein and non-starchy vegetables. Distributed recipes and snack ideas to patient to try. Additionally discussed with patient that exercise may cause blood sugar to decrease and that we may need to add in a snack before or after workout, to manage any changes. Distributed handout of snack ideas that would provide a combination of carbohydrates and protein. Pt verbalized understanding of material discussed today. Distributed RD contact information.     Michaele Offer, MS, RDN, LDN

## 2019-10-22 ENCOUNTER — Other Ambulatory Visit: Payer: Self-pay

## 2019-10-22 ENCOUNTER — Encounter (HOSPITAL_COMMUNITY)
Admission: RE | Admit: 2019-10-22 | Discharge: 2019-10-22 | Disposition: A | Discharge status: Home / Self Care | Payer: Medicare Other | Source: Ambulatory Visit | Attending: Cardiology | Admitting: Cardiology

## 2019-10-22 DIAGNOSIS — Z941 Heart transplant status: Secondary | ICD-10-CM

## 2019-10-24 ENCOUNTER — Other Ambulatory Visit: Payer: Self-pay

## 2019-10-24 ENCOUNTER — Encounter (HOSPITAL_COMMUNITY)
Admission: RE | Admit: 2019-10-24 | Discharge: 2019-10-24 | Disposition: A | Discharge status: Home / Self Care | Payer: Medicare Other | Source: Ambulatory Visit | Attending: Cardiology | Admitting: Cardiology

## 2019-10-24 DIAGNOSIS — Z941 Heart transplant status: Secondary | ICD-10-CM

## 2019-10-27 ENCOUNTER — Other Ambulatory Visit: Payer: Self-pay

## 2019-10-27 ENCOUNTER — Encounter (HOSPITAL_COMMUNITY)
Admission: RE | Admit: 2019-10-27 | Discharge: 2019-10-27 | Disposition: A | Discharge status: Home / Self Care | Payer: Medicare Other | Source: Ambulatory Visit | Attending: Cardiology | Admitting: Cardiology

## 2019-10-27 DIAGNOSIS — Z941 Heart transplant status: Secondary | ICD-10-CM | POA: Diagnosis not present

## 2019-10-29 ENCOUNTER — Encounter (HOSPITAL_COMMUNITY)
Admission: RE | Admit: 2019-10-29 | Discharge: 2019-10-29 | Disposition: A | Discharge status: Home / Self Care | Payer: Medicare Other | Source: Ambulatory Visit | Attending: Cardiology | Admitting: Cardiology

## 2019-10-29 ENCOUNTER — Other Ambulatory Visit: Payer: Self-pay

## 2019-10-29 DIAGNOSIS — Z941 Heart transplant status: Secondary | ICD-10-CM

## 2019-10-31 ENCOUNTER — Encounter (HOSPITAL_COMMUNITY)
Admission: RE | Admit: 2019-10-31 | Discharge: 2019-10-31 | Disposition: A | Discharge status: Home / Self Care | Payer: Medicare Other | Source: Ambulatory Visit | Attending: Cardiology | Admitting: Cardiology

## 2019-10-31 ENCOUNTER — Other Ambulatory Visit: Payer: Self-pay

## 2019-10-31 DIAGNOSIS — Z941 Heart transplant status: Secondary | ICD-10-CM | POA: Diagnosis not present

## 2019-11-03 ENCOUNTER — Encounter (HOSPITAL_COMMUNITY)
Admission: RE | Admit: 2019-11-03 | Discharge: 2019-11-03 | Disposition: A | Discharge status: Home / Self Care | Payer: Medicare Other | Source: Ambulatory Visit | Attending: Cardiology | Admitting: Cardiology

## 2019-11-03 ENCOUNTER — Other Ambulatory Visit: Payer: Self-pay

## 2019-11-03 DIAGNOSIS — Z941 Heart transplant status: Secondary | ICD-10-CM | POA: Diagnosis not present

## 2019-11-04 DIAGNOSIS — E1122 Type 2 diabetes mellitus with diabetic chronic kidney disease: Secondary | ICD-10-CM | POA: Diagnosis not present

## 2019-11-04 DIAGNOSIS — Z7952 Long term (current) use of systemic steroids: Secondary | ICD-10-CM | POA: Diagnosis not present

## 2019-11-04 DIAGNOSIS — Z79899 Other long term (current) drug therapy: Secondary | ICD-10-CM | POA: Diagnosis not present

## 2019-11-04 DIAGNOSIS — T8621 Heart transplant rejection: Secondary | ICD-10-CM | POA: Diagnosis not present

## 2019-11-04 DIAGNOSIS — H538 Other visual disturbances: Secondary | ICD-10-CM | POA: Diagnosis not present

## 2019-11-04 DIAGNOSIS — I1310 Hypertensive heart and chronic kidney disease without heart failure, with stage 1 through stage 4 chronic kidney disease, or unspecified chronic kidney disease: Secondary | ICD-10-CM | POA: Diagnosis not present

## 2019-11-04 DIAGNOSIS — D84821 Immunodeficiency due to drugs: Secondary | ICD-10-CM | POA: Diagnosis not present

## 2019-11-04 DIAGNOSIS — Z7982 Long term (current) use of aspirin: Secondary | ICD-10-CM | POA: Diagnosis not present

## 2019-11-04 DIAGNOSIS — Z48298 Encounter for aftercare following other organ transplant: Secondary | ICD-10-CM | POA: Diagnosis not present

## 2019-11-04 DIAGNOSIS — I371 Nonrheumatic pulmonary valve insufficiency: Secondary | ICD-10-CM | POA: Diagnosis not present

## 2019-11-04 DIAGNOSIS — Z940 Kidney transplant status: Secondary | ICD-10-CM | POA: Diagnosis not present

## 2019-11-04 DIAGNOSIS — N183 Chronic kidney disease, stage 3 unspecified: Secondary | ICD-10-CM | POA: Diagnosis not present

## 2019-11-04 DIAGNOSIS — Z941 Heart transplant status: Secondary | ICD-10-CM | POA: Diagnosis not present

## 2019-11-04 DIAGNOSIS — E785 Hyperlipidemia, unspecified: Secondary | ICD-10-CM | POA: Diagnosis not present

## 2019-11-04 DIAGNOSIS — Z23 Encounter for immunization: Secondary | ICD-10-CM | POA: Diagnosis not present

## 2019-11-05 ENCOUNTER — Other Ambulatory Visit: Payer: Self-pay

## 2019-11-05 ENCOUNTER — Encounter (HOSPITAL_COMMUNITY)
Admission: RE | Admit: 2019-11-05 | Discharge: 2019-11-05 | Disposition: A | Discharge status: Home / Self Care | Payer: Medicare Other | Source: Ambulatory Visit | Attending: Cardiology | Admitting: Cardiology

## 2019-11-05 DIAGNOSIS — Z941 Heart transplant status: Secondary | ICD-10-CM

## 2019-11-05 NOTE — Progress Notes (Signed)
Incomplete Session Note  Patient Details  Name: Marvin Martin MRN: [id number] Date of Birth: 1965-06-10 Referring Provider:     CARDIAC REHAB PHASE II ORIENTATION from 10/02/2019 in [facility]  Referring Provider Lattie Haw MD, Dr Fransico Him MD Covering      [facility] did not complete his rehab session.  Jeno reports that he went to [facility] on Monday and had a biopsy. Patient reports feeling nauseated. Advised the patient not to exercise today. Patient states understanding.Barnet Pall, RN,BSN 11/05/2019 8:27 AM

## 2019-11-07 ENCOUNTER — Encounter (HOSPITAL_COMMUNITY): Payer: Medicare Other

## 2019-11-07 ENCOUNTER — Telehealth (HOSPITAL_COMMUNITY): Payer: Self-pay | Admitting: *Deleted

## 2019-11-07 NOTE — Telephone Encounter (Signed)
Called the patient. Spoke with the transplant coordinator. Marvin Martin has no restrictions and can resume exercise on Monday. Patient states understanding and plans to attend.Barnet Pall, RN,BSN 11/07/2019 10:25 AM

## 2019-11-10 ENCOUNTER — Other Ambulatory Visit: Payer: Self-pay

## 2019-11-10 ENCOUNTER — Encounter (HOSPITAL_COMMUNITY)
Admission: RE | Admit: 2019-11-10 | Discharge: 2019-11-10 | Disposition: A | Discharge status: Home / Self Care | Payer: Medicare Other | Source: Ambulatory Visit | Attending: Cardiology | Admitting: Cardiology

## 2019-11-10 DIAGNOSIS — Z941 Heart transplant status: Secondary | ICD-10-CM | POA: Diagnosis not present

## 2019-11-11 ENCOUNTER — Ambulatory Visit: Payer: Medicare Other | Admitting: Family Medicine

## 2019-11-12 ENCOUNTER — Encounter (HOSPITAL_COMMUNITY)
Admission: RE | Admit: 2019-11-12 | Discharge: 2019-11-12 | Disposition: A | Discharge status: Home / Self Care | Payer: Medicare Other | Source: Ambulatory Visit | Attending: Cardiology | Admitting: Cardiology

## 2019-11-12 ENCOUNTER — Other Ambulatory Visit: Payer: Self-pay

## 2019-11-12 DIAGNOSIS — Z941 Heart transplant status: Secondary | ICD-10-CM | POA: Diagnosis not present

## 2019-11-13 NOTE — Progress Notes (Signed)
Cardiac Individual Treatment Plan  Patient Details  Name: Marvin Martin MRN: [id number] Date of Birth: 07/20/1965 Referring Provider:     CARDIAC REHAB PHASE II ORIENTATION from 10/02/2019 in [place]  Referring Provider Lattie Haw MD, Dr Fransico Him MD Covering      Initial Encounter Date:    CARDIAC REHAB [facility] from 10/02/2019 in [place]  Date 10/02/19      Visit Diagnosis: S/P Heart Transplant 12/29/18  Patient's Home Medications on Admission:  Current Outpatient Medications:  .  amLODipine (NORVASC) 10 MG tablet, Take 10 mg by mouth daily., Disp: , Rfl:  .  aspirin 81 MG tablet, Take 81 mg by mouth daily., Disp: , Rfl:  .  Calcium Carbonate-Vitamin D 600-400 MG-UNIT tablet, Take 1 tablet by mouth daily., Disp: , Rfl:  .  carvedilol (COREG) 12.5 MG tablet, Take 12.5 mg by mouth 2 (two) times daily., Disp: , Rfl:  .  cholecalciferol (VITAMIN D) 1000 UNITS tablet, Take 1,000 Units by mouth daily., Disp: , Rfl:  .  Continuous Blood Gluc Receiver (FREESTYLE LIBRE 2 READER) DEVI, 3 Units by Does not apply route in the morning, at noon, and at bedtime., Disp: 1 each, Rfl: 4 .  Continuous Blood Gluc Sensor (FREESTYLE LIBRE 2 SENSOR) MISC, 3 Units by Does not apply route in the morning, at noon, and at bedtime., Disp: 1 each, Rfl: 4 .  doxazosin (CARDURA) 2 MG tablet, Take 2 mg by mouth at bedtime., Disp: , Rfl:  .  ezetimibe (ZETIA) 10 MG tablet, Take 1 tablet (10 mg total) by mouth daily. Needs appt for further refills, Disp: 90 tablet, Rfl: 0 .  HYDROcodone-acetaminophen (NORCO/VICODIN) 5-325 MG tablet, Take 1 tablet by mouth every 4 (four) hours as needed., Disp: 10 tablet, Rfl: 0 .  insulin aspart (NOVOLOG FLEXPEN) 100 UNIT/ML FlexPen, Inject 0-12 Units into the skin 3 (three) times daily with meals. Per sliding scale, Disp: 30 mL, Rfl: 6 .  insulin glargine (LANTUS SOLOSTAR) 100 UNIT/ML Solostar  Pen, Inject 20 Units into the skin daily., Disp: 30 mL, Rfl: 6 .  Insulin Pen Needle 32G X 4 MM MISC, 1 application by Does not apply route 4 (four) times daily., Disp: 200 each, Rfl: 5 .  isosorbide mononitrate (IMDUR) 30 MG 24 hr tablet, TAKE 1 TABLET BY MOUTH EVERY DAY, Disp: 90 tablet, Rfl: 3 .  magnesium oxide (MAG-OX) 400 (241.3 Mg) MG tablet, Take 400 mg by mouth 2 (two) times daily. , Disp: , Rfl:  .  mycophenolate (CELLCEPT) 500 MG tablet, Take 1,500 mg by mouth in the morning and at bedtime., Disp: , Rfl:  .  nitroGLYCERIN (NITROSTAT) 0.4 MG SL tablet, Place 1 tablet (0.4 mg total) under the tongue every 5 (five) minutes as needed for chest pain. Reported on 04/12/2015, Disp: 60 tablet, Rfl: 1 .  nystatin (MYCOSTATIN) 100000 UNIT/ML suspension, Take 5 mLs by mouth See admin instructions. Swish and swallow 5 ml's by mouth four times a day, Disp: , Rfl:  .  omega-3 acid ethyl esters (LOVAZA) 1 G capsule, Take 1 g by mouth daily. , Disp: , Rfl:  .  ondansetron (ZOFRAN) 4 MG tablet, Take 1 tablet (4 mg total) every 8 (eight) hours as needed by mouth for nausea or vomiting. Reported on 05/24/2015, Disp: 30 tablet, Rfl: 1 .  pantoprazole (PROTONIX) 40 MG tablet, Take 1 tablet by mouth at bedtime. , Disp: , Rfl:  .  PRALUENT 75 MG/ML SOAJ, INJECT 75 MG INTO THE SKIN EVERY 14 (FOURTEEN) DAYS., Disp: 6 pen, Rfl: 1 .  predniSONE (DELTASONE) 5 MG tablet, Take 1 tablet by mouth in the morning. , Disp: , Rfl:  .  tacrolimus (PROGRAF) 1 MG capsule, Take 3-4 mg by mouth See admin instructions. Take 4 mg by mouth in the morning and 3 mg in the evening, Disp: , Rfl:   Past Medical History: Past Medical History:  Diagnosis Date  . Chronic systolic CHF (congestive heart failure) ([place])    a. 01/2015 Echo: EF 20-25%, antlat, inflat AK.  [name] CKD (chronic kidney disease), stage III   . Coronary artery disease    a. s/p MI x 4;  b. Reported h/o 17 stents with recurrent ISR;  c. 02/2014 Cath ([facility]): PCI to unknown  vessel;  d. 01/2015 MV: EF 18%, large scar, no ischemia; e. 03/2015 PCI: LM nl, LAD nl, D2 patent stent, RI patent stent, LCX patent stents p/m, RCA 95p ISR (PTCA), 33m 100d into RPL CTO, RPDA 90 (2.5x16 Synergy DES).  . Hyperlipidemia   . Hypertensive heart disease   . Ischemic cardiomyopathy    a. s/p SJM ICD 2007 w/ gen change in 2012; b. 12/2014 CPX Test: mild to mod HF limitation w/ pVO2 20.3 and nl slope;  c. 01/2015 Echo: EF 20-25%.    Tobacco Use: Social History   Tobacco Use  Smoking Status Never Smoker  Smokeless Tobacco Never Used    Labs: Recent Review Flowsheet Data    Labs for ITP Cardiac and Pulmonary Rehab Latest Ref Rng & Units 09/16/2015 08/21/2016 04/05/2019 07/29/2019 07/29/2019   Cholestrol 100 - 199 mg/dL - 88(L) - - -   LDLCALC 0 - 99 mg/dL - 23 - - -   HDL >39 mg/dL - 47 - - -   Trlycerides 0 - 149 mg/dL - 92 - - -   Hemoglobin A1c 4.8 - 5.6 % - - 14.0(H) 14.3(A) 13.7(H)   PHART 7.35 - 7.45 - - - - -   PCO2ART 35 - 45 mmHg - - - - -   HCO3 20.0 - 28.0 mmol/L - - 25.4 - 25.7   TCO2 22 - 32 mmol/L - - 26 - 27   ACIDBASEDEF 0.0 - 2.0 mmol/L - - - - -   O2SAT % 63.1 - 97.0 - 99.0      Capillary Blood Glucose: Lab Results  Component Value Date   GLUCAP 176 (H) 10/10/2019   GLUCAP 164 (H) 10/08/2019   GLUCAP 206 (H) 10/08/2019   GLUCAP 146 (H) 10/06/2019   GLUCAP 177 (H) 10/06/2019     Exercise Target Goals: Exercise Program Goal: Individual exercise prescription set using results from initial 6 min walk test and THRR while considering  patient's activity barriers and safety.   Exercise Prescription Goal: Initial exercise prescription builds to 30-45 minutes a day of aerobic activity, 2-3 days per week.  Home exercise guidelines will be given to patient during program as part of exercise prescription that the participant will acknowledge.  Activity Barriers & Risk Stratification:  Activity Barriers & Cardiac Risk Stratification - 10/02/19 1033       Activity Barriers & Cardiac Risk Stratification   Activity Barriers None    Cardiac Risk Stratification High           6 Minute Walk:  6 Minute Walk    Row Name 10/02/19 1031         6 Minute  Walk   Phase Initial     Distance 1406 feet     Walk Time 6 minutes     # of Rest Breaks 0     MPH 2.7     METS 4.2     RPE 11     Perceived Dyspnea  0     VO2 Peak 14.6     Symptoms No     Resting HR 89 bpm     Resting BP 122/80     Resting Oxygen Saturation  99 %     Exercise Oxygen Saturation  during 6 min walk 100 %     Max Ex. HR 92 bpm     Max Ex. BP 130/82     2 Minute Post BP 114/76            Oxygen Initial Assessment:   Oxygen Re-Evaluation:   Oxygen Discharge (Final Oxygen Re-Evaluation):   Initial Exercise Prescription:  Initial Exercise Prescription - 10/02/19 1000      Date of Initial Exercise RX and Referring Provider   Date 10/02/19    Referring Provider Lattie Haw MD, Dr Fransico Him MD Covering    Expected Discharge Date 11/28/19      Treadmill   MPH 2.4    Grade 1    Minutes 15    METs 3.17      NuStep   Level 2    SPM 80    Minutes 15    METs 2.5      Prescription Details   Frequency (times per week) 3x    Duration Progress to 30 minutes of continuous aerobic without signs/symptoms of physical distress      Intensity   THRR 40-80% of Max Heartrate 66-133    Ratings of Perceived Exertion 11-13    Perceived Dyspnea 0-4      Progression   Progression Continue progressive overload as per policy without signs/symptoms or physical distress.      Resistance Training   Training Prescription Yes    Weight 3lbs    Reps 10-15           Perform Capillary Blood Glucose checks as needed.  Exercise Prescription Changes:  Exercise Prescription Changes    Row Name 10/06/19 0706 10/20/19 0710 11/03/19 0714         Response to Exercise   Blood Pressure (Admit) 130/84 100/70 106/80     Blood Pressure (Exercise) 124/76 120/78  100/72     Blood Pressure (Exit) 100/70 104/72 104/76     Heart Rate (Admit) 84 bpm 84 bpm 97 bpm     Heart Rate (Exercise) 105 bpm 97 bpm 112 bpm     Heart Rate (Exit) 94 bpm 92 bpm 98 bpm     Rating of Perceived Exertion (Exercise) _0 Symptoms none none none     Comments Off to a good start with exercise. -- --     Duration Continue with 30 min of aerobic exercise without signs/symptoms of physical distress. Continue with 30 min of aerobic exercise without signs/symptoms of physical distress. Continue with 30 min of aerobic exercise without signs/symptoms of physical distress.     Intensity THRR unchanged THRR unchanged THRR unchanged       Progression   Progression Continue to progress workloads to maintain intensity without signs/symptoms of physical distress. Continue to progress workloads to maintain intensity without signs/symptoms of physical distress. Continue to progress workloads to maintain intensity without  signs/symptoms of physical distress.     Average METs 3.1 3.1 3.6       Resistance Training   Training Prescription Yes Yes Yes     Weight 3lbs 3lbs 4lbs     Reps 10-15 10-15 10-15     Time 10 Minutes 10 Minutes 10 Minutes       Interval Training   Interval Training No No No       Treadmill   MPH 2.4 2.4 2.4     Grade _0 Minutes _1 METs 3.17 3.17 3.17       NuStep   Level _2 SPM 80 80 80     Minutes _3 METs 3 3.1 4.1       Home Exercise Plan   Plans to continue exercise at -- Home (comment)  Walking Home (comment)  Walking     Frequency -- Add 3 additional days to program exercise sessions. Add 3 additional days to program exercise sessions.     Initial Home Exercises Provided -- 10/13/19 10/13/19            Exercise Comments:  Exercise Comments    Row Name 10/06/19 9381 10/08/19 0720 10/13/19 0713 10/22/19 0805 11/12/19 0715   Exercise Comments Patient tolerated low intensity exercise well without symptoms.  Reviewed METs with patient. Reviewed home exercise guidelines with patient. Reviewed METs and goals with patient. Reviewed METs and goals with patient.          Exercise Goals and Review:  Exercise Goals    Row Name 10/02/19 1033             Exercise Goals   Increase Physical Activity Yes       Intervention Provide advice, education, support and counseling about physical activity/exercise needs.;Develop an individualized exercise prescription for aerobic and resistive training based on initial evaluation findings, risk stratification, comorbidities and participant's personal goals.       Expected Outcomes Short Term: Attend rehab on a regular basis to increase amount of physical activity.;Long Term: Add in home exercise to make exercise part of routine and to increase amount of physical activity.;Long Term: Exercising regularly at least 3-5 days a week.       Increase Strength and Stamina Yes       Intervention Provide advice, education, support and counseling about physical activity/exercise needs.;Develop an individualized exercise prescription for aerobic and resistive training based on initial evaluation findings, risk stratification, comorbidities and participant's personal goals.       Expected Outcomes Short Term: Increase workloads from initial exercise prescription for resistance, speed, and METs.;Short Term: Perform resistance training exercises routinely during rehab and add in resistance training at home;Long Term: Improve cardiorespiratory fitness, muscular endurance and strength as measured by increased METs and functional capacity (6MWT)       Able to understand and use rate of perceived exertion (RPE) scale Yes       Intervention Provide education and explanation on how to use RPE scale       Expected Outcomes Short Term: Able to use RPE daily in rehab to express subjective intensity level;Long Term:  Able to use RPE to guide intensity level when exercising independently        Knowledge and understanding of Target Heart Rate Range (THRR) Yes       Intervention Provide education and explanation of THRR  including how the numbers were predicted and where they are located for reference       Expected Outcomes Short Term: Able to state/look up THRR;Long Term: Able to use THRR to govern intensity when exercising independently;Short Term: Able to use daily as guideline for intensity in rehab       Able to check pulse independently Yes       Intervention Provide education and demonstration on how to check pulse in carotid and radial arteries.;Review the importance of being able to check your own pulse for safety during independent exercise       Expected Outcomes Short Term: Able to explain why pulse checking is important during independent exercise;Long Term: Able to check pulse independently and accurately       Understanding of Exercise Prescription Yes       Intervention Provide education, explanation, and written materials on patient's individual exercise prescription       Expected Outcomes Short Term: Able to explain program exercise prescription;Long Term: Able to explain home exercise prescription to exercise independently              Exercise Goals Re-Evaluation :  Exercise Goals Re-Evaluation    Row Name 10/06/19 0807 10/13/19 0713 10/22/19 0805 11/12/19 0715       Exercise Goal Re-Evaluation   Exercise Goals Review Increase Physical Activity;Able to understand and use rate of perceived exertion (RPE) scale Increase Physical Activity;Able to understand and use rate of perceived exertion (RPE) scale;Understanding of Exercise Prescription;Increase Strength and Stamina;Knowledge and understanding of Target Heart Rate Range (THRR) Increase Physical Activity;Able to understand and use rate of perceived exertion (RPE) scale;Understanding of Exercise Prescription;Increase Strength and Stamina;Knowledge and understanding of Target Heart Rate Range (THRR) Increase Physical  Activity;Able to understand and use rate of perceived exertion (RPE) scale;Understanding of Exercise Prescription;Increase Strength and Stamina;Knowledge and understanding of Target Heart Rate Range (THRR)    Comments Patient able to understand and use RPE scale appropriately. Reviewed home exercise guidelines with patient including endpoints, temperature precautions, target heart rate and rate of perceived exertion. Patient is currently walking 20-30 minutes, at least 3 days/week as his mode of home exercise. Discussed increasing duration to 30 minutes consistently, and patient is amenable to this. Reviewed how to count pulse. Patient is walking 20-30 minutes, 3 days/week at home in addition to exercise at cardiac rehab. Patient's goal is to increase his weight, increase strength, and be healthier. Discussed increasing hand weights at cardiac rehab, and patient is amenable to this. Patient is walking at least 30 minutes, at least 3 days/week. [facility] program and will walk and exercise at the gym. Patient doesn't have hand weights currently but plans to purchase some for home use. Patient feels his strength and stamina are improving. Patient's weight is 146.4 up 1.2 lbs since orientation. Patient's goal weight is 150-160lbs. Patient is working on [employer] gain with Ailene Ravel, the dietitan.    Expected Outcomes Progress workloads as tolerated to help achieve personal health and fitness goals. Patient will continue walking at least 3 days/week and will increase duration to 30 minutes consistently. Increase workloads and hand weights as tolerated to help achieve personal health and fitness goals. Patient will continue walking at least 30 minutes, 3 days/week in addition to exercise at cardiac rehab.           Discharge Exercise Prescription (Final Exercise Prescription Changes):  Exercise Prescription Changes - 11/03/19 4944  Response to Exercise    Blood Pressure (Admit) 106/80    Blood Pressure (Exercise) 100/72    Blood Pressure (Exit) 104/76    Heart Rate (Admit) 97 bpm    Heart Rate (Exercise) 112 bpm    Heart Rate (Exit) 98 bpm    Rating of Perceived Exertion (Exercise) 12    Symptoms none    Duration Continue with 30 min of aerobic exercise without signs/symptoms of physical distress.    Intensity THRR unchanged      Progression   Progression Continue to progress workloads to maintain intensity without signs/symptoms of physical distress.    Average METs 3.6      Resistance Training   Training Prescription Yes    Weight 4lbs    Reps 10-15    Time 10 Minutes      Interval Training   Interval Training No      Treadmill   MPH 2.4    Grade 1    Minutes 15    METs 3.17      NuStep   Level 4    SPM 80    Minutes 15    METs 4.1      Home Exercise Plan   Plans to continue exercise at Home (comment)   Walking   Frequency Add 3 additional days to program exercise sessions.    Initial Home Exercises Provided 10/13/19           Nutrition:  Target Goals: Understanding of nutrition guidelines, daily intake of sodium <1551m, cholesterol <2082m calories 30% from fat and 7% or less from saturated fats, daily to have 5 or more servings of fruits and vegetables.  Biometrics:  Pre Biometrics - 10/02/19 1032      Pre Biometrics   Height 5' 7.5" (1.715 m)    Weight 66 kg    Waist Circumference 33.5 inches    Hip Circumference 36 inches    Waist to Hip Ratio 0.93 %    BMI (Calculated) 22.44    Triceps Skinfold 7 mm    % Body Fat 19.1 %    Grip Strength 43 kg    Flexibility 13.5 in    Single Leg Stand 18.37 seconds            Nutrition Therapy Plan and Nutrition Goals:  Nutrition Therapy & Goals - 10/10/19 1329      Nutrition Therapy   Diet Heart healthy/carb modified      Personal Nutrition Goals   Nutrition Goal Improved blood glucose control as evidenced by pt's A1c trending from 13.4 toward less  than 7.0.    Personal Goal #2 CBG concentrations in the normal range or as close to normal as is safely possible.      Intervention Plan   Intervention Prescribe, educate and counsel regarding individualized specific dietary modifications aiming towards targeted core components such as weight, hypertension, lipid management, diabetes, heart failure and other comorbidities.    Expected Outcomes Short Term Goal: A plan has been developed with personal nutrition goals set during dietitian appointment.;Long Term Goal: Adherence to prescribed nutrition plan.           Nutrition Assessments:   Nutrition Goals Re-Evaluation:  Nutrition Goals Re-Evaluation    [place] 10/10/19 1330 11/11/19 1431           Goals   Current Weight 145 lb (65.8 kg) 147 lb 0.8 oz (66.7 kg)      Nutrition Goal Improved blood glucose control as evidenced  by pt's A1c trending from 13.4 toward less than 7.0. Improved blood glucose control as evidenced by pt's A1c trending from 13.4 toward less than 7.0.      Comment -- Reviewed carb counting and glycemic targets for blood sugars with pt.        Personal Goal #2 Re-Evaluation   Personal Goal #2 CBG concentrations in the normal range or as close to normal as is safely possible. CBG concentrations in the normal range or as close to normal as is safely possible.             Nutrition Goals Re-Evaluation:  Nutrition Goals Re-Evaluation    [place] Name 10/10/19 1330 11/11/19 1431           Goals   Current Weight 145 lb (65.8 kg) 147 lb 0.8 oz (66.7 kg)      Nutrition Goal Improved blood glucose control as evidenced by pt's A1c trending from 13.4 toward less than 7.0. Improved blood glucose control as evidenced by pt's A1c trending from 13.4 toward less than 7.0.      Comment -- Reviewed carb counting and glycemic targets for blood sugars with pt.        Personal Goal #2 Re-Evaluation   Personal Goal #2 CBG concentrations in the normal range or as close to normal  as is safely possible. CBG concentrations in the normal range or as close to normal as is safely possible.             Nutrition Goals Discharge (Final Nutrition Goals Re-Evaluation):  Nutrition Goals Re-Evaluation - 11/11/19 1431      Goals   Current Weight 147 lb 0.8 oz (66.7 kg)    Nutrition Goal Improved blood glucose control as evidenced by pt's A1c trending from 13.4 toward less than 7.0.    Comment Reviewed carb counting and glycemic targets for blood sugars with pt.      Personal Goal #2 Re-Evaluation   Personal Goal #2 CBG concentrations in the normal range or as close to normal as is safely possible.           Psychosocial: Target Goals: Acknowledge presence or absence of significant depression and/or stress, maximize coping skills, provide positive support system. Participant is able to verbalize types and ability to use techniques and skills needed for reducing stress and depression.  Initial Review & Psychosocial Screening:  Initial Psych Review & Screening - 10/02/19 1221      Initial Review   Current issues with None Identified      Family Dynamics   Good Support System? Yes   Subhan lives alone. Barnard has his brother and sister who live near by for support     Barriers   Psychosocial barriers to participate in program There are no identifiable barriers or psychosocial needs.      Screening Interventions   Interventions Encouraged to exercise           Quality of Life Scores:  Quality of Life - 10/02/19 1029      Quality of Life   Select Quality of Life      Quality of Life Scores   Health/Function Pre 28.47 %    Socioeconomic Pre 26.69 %    Psych/Spiritual Pre 30 %    Family Pre 27.4 %    GLOBAL Pre 28.21 %          Scores of 19 and below usually indicate a poorer quality of life in these areas.  A difference  of  2-3 points is a clinically meaningful difference.  A difference of 2-3 points in the total score of the Quality of Life Index has  been associated with significant improvement in overall quality of life, self-image, physical symptoms, and general health in studies assessing change in quality of life.  PHQ-9: Recent Review Flowsheet Data    Depression screen [facility] 2/9 10/02/2019 08/11/2019 07/29/2019 01/07/2018 08/03/2015   Decreased Interest 0 - 0 0 0   Down, Depressed, Hopeless 0 0 0 0 0   PHQ - 2 Score 0 0 0 0 0   Altered sleeping - 0 0 0 -   Tired, decreased energy - 1 2 0 -   Change in appetite - 0 0 0 -   Feeling bad or failure about yourself  - 0 0 0 -   Trouble concentrating - 0 0 0 -   Moving slowly or fidgety/restless - 0 0 0 -   Suicidal thoughts - 0 0 0 -   PHQ-9 Score - 1 2 0 -     Interpretation of Total Score  Total Score Depression Severity:  1-4 = Minimal depression, 5-9 = Mild depression, 10-14 = Moderate depression, 15-19 = Moderately severe depression, 20-27 = Severe depression   Psychosocial Evaluation and Intervention:  Psychosocial Evaluation - 10/06/19 1102      Psychosocial Evaluation & Interventions   Interventions Encouraged to exercise with the program and follow exercise prescription    Comments Mr. Mohrmann presents to cardiac rehab with a positive attitude and outlook. He is eager to participate in the program. Admits to having a strong support system including his siblings, children, extended family and friends. He is separated from his wife however they have a good relationship. His "hobbies" are relaxing and watching TV. He denies stress at this time. No interventions needed.    Expected Outcomes Patient will continue to have a positive attitude and outlook.    Continue Psychosocial Services  No Follow up required           Psychosocial Re-Evaluation:  Psychosocial Re-Evaluation    [place] Name 10/14/19 0724 10/27/19 1157           Psychosocial Re-Evaluation   Current issues with None Identified None Identified      Comments Mr. Rackley continue to present to his cardiac  rehab exercise sessions with a positive outlook and attitude. He enjoys conversating with staff and classmates. He willingly shares stories of his children. Admits to a strong support system including family and friends. He is separated from his wife but they have a good relationship and she remians supportive. No interventions needed at this time. Mr. Arnott continue to present to his cardiac rehab exercise sessions with a positive outlook and attitude. He enjoys conversating with staff and classmates. He willingly shares stories of his children and most recently states his young daughter is visiting for 2 weeks. Admits to a strong support system including family and friends. He is separated from his wife but they have a good relationship and she remians supportive. No interventions needed at this time.      Expected Outcomes patient will continue to have a positive attitude and outlook. He will utilize his support system as needed. Patient will continue to have a positive attitude and outlook. He will continue to utilize his support system as needed. He will deny psychosocial barriers to participation in CR or self health management. He will continue self assessment for barriers and notify staff  if identified.      Interventions Encouraged to attend Cardiac Rehabilitation for the exercise Encouraged to attend Cardiac Rehabilitation for the exercise      Continue Psychosocial Services  No Follow up required No Follow up required             Psychosocial Discharge (Final Psychosocial Re-Evaluation):  Psychosocial Re-Evaluation - 10/27/19 1157      Psychosocial Re-Evaluation   Current issues with None Identified    Comments Mr. Kretschmer continue to present to his cardiac rehab exercise sessions with a positive outlook and attitude. He enjoys conversating with staff and classmates. He willingly shares stories of his children and most recently states his young daughter is visiting for 2 weeks. Admits  to a strong support system including family and friends. He is separated from his wife but they have a good relationship and she remians supportive. No interventions needed at this time.    Expected Outcomes Patient will continue to have a positive attitude and outlook. He will continue to utilize his support system as needed. He will deny psychosocial barriers to participation in CR or self health management. He will continue self assessment for barriers and notify staff if identified.    Interventions Encouraged to attend Cardiac Rehabilitation for the exercise    Continue Psychosocial Services  No Follow up required           Vocational Rehabilitation: Provide vocational rehab assistance to qualifying candidates.   Vocational Rehab Evaluation & Intervention:  Vocational Rehab - 10/02/19 1222      Initial Vocational Rehab Evaluation & Intervention   Assessment shows need for Vocational Rehabilitation No           Education: Education Goals: Education classes will be provided on a weekly basis, covering required topics. Participant will state understanding/return demonstration of topics presented.  Learning Barriers/Preferences:  Learning Barriers/Preferences - 10/02/19 1029      Learning Barriers/Preferences   Learning Barriers None    Learning Preferences Written Material;Group Instruction;Individual Instruction           Education Topics: Count Your Pulse:  -Group instruction provided by verbal instruction, demonstration, patient participation and written materials to support subject.  Instructors address importance of being able to find your pulse and how to count your pulse when at home without a heart monitor.  Patients get hands on experience counting their pulse with staff help and individually.   Heart Attack, Angina, and Risk Factor Modification:  -Group instruction provided by verbal instruction, video, and written materials to support subject.  Instructors  address signs and symptoms of angina and heart attacks.    Also discuss risk factors for heart disease and how to make changes to improve heart health risk factors.   Functional Fitness:  -Group instruction provided by verbal instruction, demonstration, patient participation, and written materials to support subject.  Instructors address safety measures for doing things around the house.  Discuss how to get up and down off the floor, how to pick things up properly, how to safely get out of a chair without assistance, and balance training.   Meditation and Mindfulness:  -Group instruction provided by verbal instruction, patient participation, and written materials to support subject.  Instructor addresses importance of mindfulness and meditation practice to help reduce stress and improve awareness.  Instructor also leads participants through a meditation exercise.    Stretching for Flexibility and Mobility:  -Group instruction provided by verbal instruction, patient participation, and written materials to support subject.  Instructors lead participants through series of stretches that are designed to increase flexibility thus improving mobility.  These stretches are additional exercise for major muscle groups that are typically performed during regular warm up and cool down.   Hands Only CPR:  -Group verbal, video, and participation provides a basic overview of AHA guidelines for community CPR. Role-play of emergencies allow participants the opportunity to practice calling for help and chest compression technique with discussion of AED use.   Hypertension: -Group verbal and written instruction that provides a basic overview of hypertension including the most recent diagnostic guidelines, risk factor reduction with self-care instructions and medication management.    Nutrition I class: Heart Healthy Eating:  -Group instruction provided by PowerPoint slides, verbal discussion, and written  materials to support subject matter. The instructor gives an explanation and review of the Therapeutic Lifestyle Changes diet recommendations, which includes a discussion on lipid goals, dietary fat, sodium, fiber, plant stanol/sterol esters, sugar, and the components of a well-balanced, healthy diet.   Nutrition II class: Lifestyle Skills:  -Group instruction provided by PowerPoint slides, verbal discussion, and written materials to support subject matter. The instructor gives an explanation and review of label reading, grocery shopping for heart health, heart healthy recipe modifications, and ways to make healthier choices when eating out.   Diabetes Question & Answer:  -Group instruction provided by PowerPoint slides, verbal discussion, and written materials to support subject matter. The instructor gives an explanation and review of diabetes co-morbidities, pre- and post-prandial blood glucose goals, pre-exercise blood glucose goals, signs, symptoms, and treatment of hypoglycemia and hyperglycemia, and foot care basics.   Diabetes Blitz:  -Group instruction provided by PowerPoint slides, verbal discussion, and written materials to support subject matter. The instructor gives an explanation and review of the physiology behind type 1 and type 2 diabetes, diabetes medications and rational behind using different medications, pre- and post-prandial blood glucose recommendations and Hemoglobin A1c goals, diabetes diet, and exercise including blood glucose guidelines for exercising safely.    Portion Distortion:  -Group instruction provided by PowerPoint slides, verbal discussion, written materials, and food models to support subject matter. The instructor gives an explanation of serving size versus portion size, changes in portions sizes over the last 20 years, and what consists of a serving from each food group.   Stress Management:  -Group instruction provided by verbal instruction, video, and  written materials to support subject matter.  Instructors review role of stress in heart disease and how to cope with stress positively.     Exercising on Your Own:  -Group instruction provided by verbal instruction, power point, and written materials to support subject.  Instructors discuss benefits of exercise, components of exercise, frequency and intensity of exercise, and end points for exercise.  Also discuss use of nitroglycerin and activating EMS.  Review options of places to exercise outside of rehab.  Review guidelines for sex with heart disease.   Cardiac Drugs I:  -Group instruction provided by verbal instruction and written materials to support subject.  Instructor reviews cardiac drug classes: antiplatelets, anticoagulants, beta blockers, and statins.  Instructor discusses reasons, side effects, and lifestyle considerations for each drug class.   Cardiac Drugs II:  -Group instruction provided by verbal instruction and written materials to support subject.  Instructor reviews cardiac drug classes: angiotensin converting enzyme inhibitors (ACE-I), angiotensin II receptor blockers (ARBs), nitrates, and calcium channel blockers.  Instructor discusses reasons, side effects, and lifestyle considerations for each drug class.   Anatomy and  Physiology of the Circulatory System:  Group verbal and written instruction and models provide basic cardiac anatomy and physiology, with the coronary electrical and arterial systems. Review of: AMI, Angina, Valve disease, Heart Failure, Peripheral Artery Disease, Cardiac Arrhythmia, Pacemakers, and the ICD.   Other Education:  -Group or individual verbal, written, or video instructions that support the educational goals of the cardiac rehab program.   Holiday Eating Survival Tips:  -Group instruction provided by PowerPoint slides, verbal discussion, and written materials to support subject matter. The instructor gives patients tips, tricks, and  techniques to help them not only survive but enjoy the holidays despite the onslaught of food that accompanies the holidays.   Knowledge Questionnaire Score:  Knowledge Questionnaire Score - 10/02/19 1029      Knowledge Questionnaire Score   Pre Score 17/24           Core Components/Risk Factors/Patient Goals at Admission:  Personal Goals and Risk Factors at Admission - 10/02/19 1222      Core Components/Risk Factors/Patient Goals on Admission    Weight Management Yes;Weight Gain;Weight Maintenance    Intervention Weight Management: Develop a combined nutrition and exercise program designed to reach desired caloric intake, while maintaining appropriate intake of nutrient and fiber, sodium and fats, and appropriate energy expenditure required for the weight goal.;Weight Management/Obesity: Establish reasonable short term and long term weight goals.;Weight Management: Provide education and appropriate resources to help participant work on and attain dietary goals.    Admit Weight 145 lb 8.1 oz (66 kg)    Goal Weight: Long Term 155 lb (70.3 kg)    Expected Outcomes Weight Gain: Understanding of general recommendations for a high calorie, high protein meal plan that promotes weight gain by distributing calorie intake throughout the day with the consumption for 4-5 meals, snacks, and/or supplements;Understanding of distribution of calorie intake throughout the day with the consumption of 4-5 meals/snacks;Understanding recommendations for meals to include 15-35% energy as protein, 25-35% energy from fat, 35-60% energy from carbohydrates, less than 224m of dietary cholesterol, 20-35 gm of total fiber daily;Short Term: Continue to assess and modify interventions until short term weight is achieved;Long Term: Adherence to nutrition and physical activity/exercise program aimed toward attainment of established weight goal    Diabetes Yes    Intervention Provide education about signs/symptoms and action  to take for hypo/hyperglycemia.;Provide education about proper nutrition, including hydration, and aerobic/resistive exercise prescription along with prescribed medications to achieve blood glucose in normal ranges: Fasting glucose 65-99 mg/dL    Expected Outcomes Short Term: Participant verbalizes understanding of the signs/symptoms and immediate care of hyper/hypoglycemia, proper foot care and importance of medication, aerobic/resistive exercise and nutrition plan for blood glucose control.;Long Term: Attainment of HbA1C < 7%.    Heart Failure Yes    Intervention Provide a combined exercise and nutrition program that is supplemented with education, support and counseling about heart failure. Directed toward relieving symptoms such as shortness of breath, decreased exercise tolerance, and extremity edema.    Expected Outcomes Improve functional capacity of life;Short term: Attendance in program 2-3 days a week with increased exercise capacity. Reported lower sodium intake. Reported increased fruit and vegetable intake. Reports medication compliance.;Short term: Daily weights obtained and reported for increase. Utilizing diuretic protocols set by physician.;Long term: Adoption of self-care skills and reduction of barriers for early signs and symptoms recognition and intervention leading to self-care maintenance.    Hypertension Yes    Intervention Provide education on lifestyle modifcations including regular physical activity/exercise, weight  management, moderate sodium restriction and increased consumption of fresh fruit, vegetables, and low fat dairy, alcohol moderation, and smoking cessation.;Monitor prescription use compliance.    Expected Outcomes Short Term: Continued assessment and intervention until BP is < 140/37m HG in hypertensive participants. < 130/818mHG in hypertensive participants with diabetes, heart failure or chronic kidney disease.;Long Term: Maintenance of blood pressure at goal levels.     Lipids Yes    Intervention Provide education and support for participant on nutrition & aerobic/resistive exercise along with prescribed medications to achieve LDL <7043mHDL >18m65m  Expected Outcomes Short Term: Participant states understanding of desired cholesterol values and is compliant with medications prescribed. Participant is following exercise prescription and nutrition guidelines.;Long Term: Cholesterol controlled with medications as prescribed, with individualized exercise RX and with personalized nutrition plan. Value goals: LDL < 70mg8mL > 40 mg.           Core Components/Risk Factors/Patient Goals Review:   Goals and Risk Factor Review    Row Name 10/06/19 1105 10/14/19 0726 10/27/19 1159         Core Components/Risk Factors/Patient Goals Review   Personal Goals Review Weight Management/Obesity;Lipids;Diabetes;Hypertension Weight Management/Obesity;Lipids;Diabetes;Hypertension Weight Management/Obesity;Lipids;Diabetes;Hypertension     Review Mr. CovinDoucetmultiple CAD risk factors however 12/2018 he recieved a heart transplant and renal transplant. His lipids and hypertension are managed by medications. His most recent post transplant A1C is 13.7. His goals are to increase his weight to 158-160 lbs and increase his stamina and strength Mr. CovinCasanovamultiple CAD risk factors however 12/2018 he recieved a heart transplant and renal transplant. His lipids and hypertension are managed by medications. His most recent post transplant A1C is 13.7. His goals are to increase his weight to 158-160 lbs and increase his stamina and strength Mr. CovinBloominues to have multiple CAD risk factors s/p heart and renal transplant. His BP is WNL for post transplant and exercise. He admits to taking all medications including antihypertensive and antidiabetic medications as prescribed. He is checking his blood sugars independently at home and reporting to CR staff. He has met with RD  who continues to assess for questions regarding new onset DM and low carb diet. He would like to gain weight however currently his weight remain 66.0 which is also his admission weight.     Expected Outcomes Patient will continue to particpate in CR for education and risk factor reductions. Patient will continue to particpate in CR for education and risk factor reductions. Mr. CovinFleek continue to participate in CR for education, exercise, and risk factor reduction. He will continue to take his BP and CBG at home and notify medical provider and CR staff if numbers are not within established parameters. He will follow heart healthy increase calorie diet as prescribed by RD. He will attend all medical appointments as scheduled.            Core Components/Risk Factors/Patient Goals at Discharge (Final Review):   Goals and Risk Factor Review - 10/27/19 1159      Core Components/Risk Factors/Patient Goals Review   Personal Goals Review Weight Management/Obesity;Lipids;Diabetes;Hypertension    Review Mr. CovinHaininues to have multiple CAD risk factors s/p heart and renal transplant. His BP is WNL for post transplant and exercise. He admits to taking all medications including antihypertensive and antidiabetic medications as prescribed. He is checking his blood sugars independently at home and reporting to CR staff. He has met with RD who continues to assess for questions  regarding new onset DM and low carb diet. He would like to gain weight however currently his weight remain 66.0 which is also his admission weight.    Expected Outcomes Mr. Quezada will continue to participate in CR for education, exercise, and risk factor reduction. He will continue to take his BP and CBG at home and notify medical provider and CR staff if numbers are not within established parameters. He will follow heart healthy increase calorie diet as prescribed by RD. He will attend all medical appointments as scheduled.             ITP Comments:  ITP Comments    Row Name 10/02/19 1021 10/06/19 1058 10/14/19 0718 10/27/19 1154     ITP Comments Dr. Fransico Him, Medical Director Mr. Sanfilippo completed his first cardiac rehab exercise session today and tolerated well. Worked to an RPE of 11-13. VSS. Complaint of mild arm discomfort while using NuStep. Discomfort relieved with intermittant resting of the arms. No cardiac complaints. Personal goals reviewed. Patient hopes to increase his stamina and strength and increase weight to 158-160 lbs. 30 day ITP review: Mr. Pinder has completed 4 cardiac rehab exercise sessions since his orientation 10/02/19. VS remain stable. Mr. Soler works to an RPE of 11-12. States he is "really enjoying" cardiac rehab. Admits to bilat arm soreness during first session but today states that soreness has resolved and he feels it is because he is increasing his muscle strength. Mr. Christopher goals are to increase his stamina and strength and to gain weight he lost during transplant. He has met with RD and will continue to intermittant consultations to assure patient is on track with healthy weight gain. Expect to see greater progression towards goals over the next 30 days. Mr. Pamer continues to participate in cardiac rehab. Vitals remain stable. Works to an RPE of 11-12. Continues to voice enjoyment with the cardiac rehab program. He is tolerating workload increases with most recent increase occuring today. His workload on the NuStep increased from a 3 to a level 4. He is on track to meet his goals of weight gain and improvement in his stamina and strength.           Comments: Pt is making expected progress toward personal goals after completing 15 sessions. Recommend continued exercise and life style modification education including  stress management and relaxation techniques to decrease cardiac risk profile.  Cherre Huger, BSN Cardiac and Training and development officer

## 2019-11-14 ENCOUNTER — Other Ambulatory Visit: Payer: Self-pay

## 2019-11-14 ENCOUNTER — Encounter (HOSPITAL_COMMUNITY)
Admission: RE | Admit: 2019-11-14 | Discharge: 2019-11-14 | Disposition: A | Discharge status: Home / Self Care | Payer: Medicare Other | Source: Ambulatory Visit | Attending: Cardiology | Admitting: Cardiology

## 2019-11-14 DIAGNOSIS — Z941 Heart transplant status: Secondary | ICD-10-CM | POA: Diagnosis not present

## 2019-11-17 ENCOUNTER — Other Ambulatory Visit: Payer: Self-pay

## 2019-11-17 ENCOUNTER — Encounter (HOSPITAL_COMMUNITY)
Admission: RE | Admit: 2019-11-17 | Discharge: 2019-11-17 | Disposition: A | Discharge status: Home / Self Care | Payer: Medicare Other | Source: Ambulatory Visit | Attending: Cardiology | Admitting: Cardiology

## 2019-11-17 DIAGNOSIS — Z941 Heart transplant status: Secondary | ICD-10-CM | POA: Diagnosis not present

## 2019-11-19 ENCOUNTER — Encounter (HOSPITAL_COMMUNITY)
Admission: RE | Admit: 2019-11-19 | Discharge: 2019-11-19 | Disposition: A | Discharge status: Home / Self Care | Payer: Medicare Other | Source: Ambulatory Visit | Attending: Cardiology | Admitting: Cardiology

## 2019-11-19 ENCOUNTER — Other Ambulatory Visit: Payer: Self-pay

## 2019-11-19 DIAGNOSIS — Z941 Heart transplant status: Secondary | ICD-10-CM

## 2019-11-21 ENCOUNTER — Other Ambulatory Visit: Payer: Self-pay

## 2019-11-21 ENCOUNTER — Encounter (HOSPITAL_COMMUNITY)
Admission: RE | Admit: 2019-11-21 | Discharge: 2019-11-21 | Disposition: A | Discharge status: Home / Self Care | Payer: Medicare Other | Source: Ambulatory Visit | Attending: Cardiology | Admitting: Cardiology

## 2019-11-21 DIAGNOSIS — Z941 Heart transplant status: Secondary | ICD-10-CM

## 2019-11-24 ENCOUNTER — Encounter (HOSPITAL_COMMUNITY)
Admission: RE | Admit: 2019-11-24 | Discharge: 2019-11-24 | Disposition: A | Discharge status: Home / Self Care | Payer: Medicare Other | Source: Ambulatory Visit | Attending: Cardiology | Admitting: Cardiology

## 2019-11-24 ENCOUNTER — Other Ambulatory Visit: Payer: Self-pay

## 2019-11-24 DIAGNOSIS — Z941 Heart transplant status: Secondary | ICD-10-CM | POA: Diagnosis not present

## 2019-11-26 ENCOUNTER — Other Ambulatory Visit: Payer: Self-pay

## 2019-11-26 ENCOUNTER — Encounter (HOSPITAL_COMMUNITY)
Admission: RE | Admit: 2019-11-26 | Discharge: 2019-11-26 | Disposition: A | Discharge status: Home / Self Care | Payer: Medicare Other | Source: Ambulatory Visit | Attending: Cardiology | Admitting: Cardiology

## 2019-11-26 DIAGNOSIS — Z941 Heart transplant status: Secondary | ICD-10-CM

## 2019-11-28 ENCOUNTER — Encounter (HOSPITAL_COMMUNITY)
Admission: RE | Admit: 2019-11-28 | Discharge: 2019-11-28 | Disposition: A | Discharge status: Home / Self Care | Payer: Medicare Other | Source: Ambulatory Visit | Attending: Cardiology | Admitting: Cardiology

## 2019-11-28 ENCOUNTER — Other Ambulatory Visit: Payer: Self-pay

## 2019-11-28 VITALS — BP 114/76 | HR 92 | Ht 66.0 in | Wt 146.4 lb

## 2019-11-28 DIAGNOSIS — Z941 Heart transplant status: Secondary | ICD-10-CM | POA: Diagnosis not present

## 2019-11-28 NOTE — Progress Notes (Signed)
Discharge Progress Report  Patient Details  Name: Marvin Martin MRN: [id number] Date of Birth: 09-15-1965 Referring Provider:     CARDIAC REHAB PHASE II ORIENTATION from 10/02/2019 in [place]  Referring Provider Lattie Haw MD, Dr Fransico Him MD Covering       Number of Visits: 22 of 24 sessions  Reason for Discharge:  Patient reached a stable level of exercise. Patient independent in their exercise. Patient has met program and personal goals.  Smoking History:  Social History   Tobacco Use  Smoking Status Never Smoker  Smokeless Tobacco Never Used    Diagnosis:  S/P Heart Transplant 12/29/18  ADL [facility]SD:   Initial Exercise Prescription:  Initial Exercise Prescription - 10/02/19 1000      Date of Initial Exercise RX and Referring Provider   Date 10/02/19    Referring Provider Lattie Haw MD, Dr Fransico Him MD Covering    Expected Discharge Date 11/28/19      Treadmill   MPH 2.4    Grade 1    Minutes 15    METs 3.17      NuStep   Level 2    SPM 80    Minutes 15    METs 2.5      Prescription Details   Frequency (times per week) 3x    Duration Progress to 30 minutes of continuous aerobic without signs/symptoms of physical distress      Intensity   THRR 40-80% of Max Heartrate 66-133    Ratings of Perceived Exertion 11-13    Perceived Dyspnea 0-4      Progression   Progression Continue progressive overload as per policy without signs/symptoms or physical distress.      Resistance Training   Training Prescription Yes    Weight 3lbs    Reps 10-15           Discharge Exercise Prescription (Final Exercise Prescription Changes):  Exercise Prescription Changes - 11/28/19 0716      Response to Exercise   Blood Pressure (Admit) 114/76    Blood Pressure (Exercise) 118/68    Blood Pressure (Exit) 118/76    Heart Rate (Admit) 92 bpm    Heart Rate (Exercise) 103 bpm    Heart Rate (Exit) 95 bpm    Rating of  Perceived Exertion (Exercise) 12    Symptoms none    Duration Continue with 30 min of aerobic exercise without signs/symptoms of physical distress.    Intensity THRR unchanged      Progression   Progression Continue to progress workloads to maintain intensity without signs/symptoms of physical distress.    Average METs 4      Resistance Training   Training Prescription Yes    Weight 5 lbs    Reps 10-15    Time 10 Minutes      Interval Training   Interval Training No      Treadmill   MPH 3.4    Grade 1    Minutes 15    METs 4.07      NuStep   Level 4    SPM 107    Minutes 15    METs 3.9      Home Exercise Plan   Plans to continue exercise at Home (comment)   Walking   Frequency Add 3 additional days to program exercise sessions.    Initial Home Exercises Provided 10/13/19           Functional Capacity:  6 Minute  Walk    Row Name 10/02/19 1031 11/21/19 0716       6 Minute Walk   Phase Initial Discharge    Distance 1406 feet 1539 feet    Distance % Change -- 9.46 %    Distance Feet Change -- 133 ft    Walk Time 6 minutes 6 minutes    # of Rest Breaks 0 0    MPH 2.7 2.91    METS 4.2 4.25    RPE 11 11    Perceived Dyspnea  0 0    VO2 Peak 14.6 14.87    Symptoms No No    Resting HR 89 bpm 93 bpm    Resting BP 122/80 104/72    Resting Oxygen Saturation  99 % --    Exercise Oxygen Saturation  during 6 min walk 100 % --    Max Ex. HR 92 bpm 96 bpm    Max Ex. BP 130/82 104/78    2 Minute Post BP 114/76 96/57           Psychological, QOL, Others - Outcomes: PHQ 2/9: Depression screen [facility] 2/9 10/02/2019 08/11/2019 07/29/2019 01/07/2018 08/03/2015  Decreased Interest 0 - 0 0 0  Down, Depressed, Hopeless 0 0 0 0 0  PHQ - 2 Score 0 0 0 0 0  Altered sleeping - 0 0 0 -  Tired, decreased energy - 1 2 0 -  Change in appetite - 0 0 0 -  Feeling bad or failure about yourself  - 0 0 0 -  Trouble concentrating - 0 0 0 -  Moving slowly or fidgety/restless - 0 0 0 -   Suicidal thoughts - 0 0 0 -  PHQ-9 Score - 1 2 0 -  Some recent data might be hidden    Quality of Life:  Quality of Life - 11/28/19 0719      Quality of Life   Select Quality of Life      Quality of Life Scores   Health/Function Pre 28.47 %    Health/Function Post 28.6 %    Health/Function % Change 0.46 %    Socioeconomic Pre 26.69 %    Socioeconomic Post 29.06 %    Socioeconomic % Change  8.88 %    Psych/Spiritual Pre 30 %    Psych/Spiritual Post 30 %    Psych/Spiritual % Change 0 %    Family Pre 27.4 %    Family Post 28.5 %    Family % Change 4.01 %    GLOBAL Pre 28.21 %    GLOBAL Post 28.97 %    GLOBAL % Change 2.69 %           Personal Goals: Goals established at orientation with interventions provided to work toward goal.  Personal Goals and Risk Factors at Admission - 10/02/19 1222      Core Components/Risk Factors/Patient Goals on Admission    Weight Management Yes;Weight Gain;Weight Maintenance    Intervention Weight Management: Develop a combined nutrition and exercise program designed to reach desired caloric intake, while maintaining appropriate intake of nutrient and fiber, sodium and fats, and appropriate energy expenditure required for the weight goal.;Weight Management/Obesity: Establish reasonable short term and long term weight goals.;Weight Management: Provide education and appropriate resources to help participant work on and attain dietary goals.    Admit Weight 145 lb 8.1 oz (66 kg)    Goal Weight: Long Term 155 lb (70.3 kg)    Expected Outcomes Weight Gain:  Understanding of general recommendations for a high calorie, high protein meal plan that promotes weight gain by distributing calorie intake throughout the day with the consumption for 4-5 meals, snacks, and/or supplements;Understanding of distribution of calorie intake throughout the day with the consumption of 4-5 meals/snacks;Understanding recommendations for meals to include 15-35% energy as  protein, 25-35% energy from fat, 35-60% energy from carbohydrates, less than 2106m of dietary cholesterol, 20-35 gm of total fiber daily;Short Term: Continue to assess and modify interventions until short term weight is achieved;Long Term: Adherence to nutrition and physical activity/exercise program aimed toward attainment of established weight goal    Diabetes Yes    Intervention Provide education about signs/symptoms and action to take for hypo/hyperglycemia.;Provide education about proper nutrition, including hydration, and aerobic/resistive exercise prescription along with prescribed medications to achieve blood glucose in normal ranges: Fasting glucose 65-99 mg/dL    Expected Outcomes Short Term: Participant verbalizes understanding of the signs/symptoms and immediate care of hyper/hypoglycemia, proper foot care and importance of medication, aerobic/resistive exercise and nutrition plan for blood glucose control.;Long Term: Attainment of HbA1C < 7%.    Heart Failure Yes    Intervention Provide a combined exercise and nutrition program that is supplemented with education, support and counseling about heart failure. Directed toward relieving symptoms such as shortness of breath, decreased exercise tolerance, and extremity edema.    Expected Outcomes Improve functional capacity of life;Short term: Attendance in program 2-3 days a week with increased exercise capacity. Reported lower sodium intake. Reported increased fruit and vegetable intake. Reports medication compliance.;Short term: Daily weights obtained and reported for increase. Utilizing diuretic protocols set by physician.;Long term: Adoption of self-care skills and reduction of barriers for early signs and symptoms recognition and intervention leading to self-care maintenance.    Hypertension Yes    Intervention Provide education on lifestyle modifcations including regular physical activity/exercise, weight management, moderate sodium restriction  and increased consumption of fresh fruit, vegetables, and low fat dairy, alcohol moderation, and smoking cessation.;Monitor prescription use compliance.    Expected Outcomes Short Term: Continued assessment and intervention until BP is < 140/930mHG in hypertensive participants. < 130/8071mG in hypertensive participants with diabetes, heart failure or chronic kidney disease.;Long Term: Maintenance of blood pressure at goal levels.    Lipids Yes    Intervention Provide education and support for participant on nutrition & aerobic/resistive exercise along with prescribed medications to achieve LDL <89m12mDL >40mg33m Expected Outcomes Short Term: Participant states understanding of desired cholesterol values and is compliant with medications prescribed. Participant is following exercise prescription and nutrition guidelines.;Long Term: Cholesterol controlled with medications as prescribed, with individualized exercise RX and with personalized nutrition plan. Value goals: LDL < 89mg,73m > 40 mg.            Personal Goals Discharge:  Goals and Risk Factor Review    Row Name 10/06/19 1105 10/14/19 0726 10/27/19 1159 12/02/19 1128       Core Components/Risk Factors/Patient Goals Review   Personal Goals Review Weight Management/Obesity;Lipids;Diabetes;Hypertension Weight Management/Obesity;Lipids;Diabetes;Hypertension Weight Management/Obesity;Lipids;Diabetes;Hypertension Weight Management/Obesity;Lipids;Diabetes;Hypertension    Review Mr. CovingFiggsultiple CAD risk factors however 12/2018 he recieved a heart transplant and renal transplant. His lipids and hypertension are managed by medications. His most recent post transplant A1C is 13.7. His goals are to increase his weight to 158-160 lbs and increase his stamina and strength Mr. CovingMarzetteultiple CAD risk factors however 12/2018 he recieved a heart transplant and renal transplant. His lipids and hypertension  are managed by medications. His  most recent post transplant A1C is 13.7. His goals are to increase his weight to 158-160 lbs and increase his stamina and strength Marvin Martin continues to have multiple CAD risk factors s/p heart and renal transplant. His BP is WNL for post transplant and exercise. He admits to taking all medications including antihypertensive and antidiabetic medications as prescribed. He is checking his blood sugars independently at home and reporting to CR staff. He has met with RD who continues to assess for questions regarding new onset DM and low carb diet. He would like to gain weight however currently his weight remain 66.0 which is also his admission weight. Marvin Martin has multiple CAD risk factors. He is taking all of his medications including transplant meds as directed. His fasting bloodsugars are reported as WNL. VSS and respond appropriately to exertion. He has met with RD on several occasions for ongoing education on low NA, low lipid, DM diet and how to gain weight with healthy foods. He has joined the [employer] and will exercise daily at the gym.    Expected Outcomes Patient will continue to particpate in CR for education and risk factor reductions. Patient will continue to particpate in CR for education and risk factor reductions. Marvin Martin will continue to participate in CR for education, exercise, and risk factor reduction. He will continue to take his BP and CBG at home and notify medical provider and CR staff if numbers are not within established parameters. He will follow heart healthy increase calorie diet as prescribed by RD. He will attend all medical appointments as scheduled. Marvin Martin will continue to utilize education learned in CR and daily exercise to modify his CAD risk factors. He will continue to take all medications as prescribed, attend all medical appointments as scheduled, exercise daily, and adhere to a health diet post discharge from CR           Exercise Goals and Review:   Exercise Goals    Row Name 10/02/19 1033             Exercise Goals   Increase Physical Activity Yes       Intervention Provide advice, education, support and counseling about physical activity/exercise needs.;Develop an individualized exercise prescription for aerobic and resistive training based on initial evaluation findings, risk stratification, comorbidities and participant's personal goals.       Expected Outcomes Short Term: Attend rehab on a regular basis to increase amount of physical activity.;Long Term: Add in home exercise to make exercise part of routine and to increase amount of physical activity.;Long Term: Exercising regularly at least 3-5 days a week.       Increase Strength and Stamina Yes       Intervention Provide advice, education, support and counseling about physical activity/exercise needs.;Develop an individualized exercise prescription for aerobic and resistive training based on initial evaluation findings, risk stratification, comorbidities and participant's personal goals.       Expected Outcomes Short Term: Increase workloads from initial exercise prescription for resistance, speed, and METs.;Short Term: Perform resistance training exercises routinely during rehab and add in resistance training at home;Long Term: Improve cardiorespiratory fitness, muscular endurance and strength as measured by increased METs and functional capacity (6MWT)       Able to understand and use rate of perceived exertion (RPE) scale Yes       Intervention Provide education and explanation on how to use RPE scale       Expected Outcomes  Short Term: Able to use RPE daily in rehab to express subjective intensity level;Long Term:  Able to use RPE to guide intensity level when exercising independently       Knowledge and understanding of Target Heart Rate Range (THRR) Yes       Intervention Provide education and explanation of THRR including how the numbers were predicted and where they are located  for reference       Expected Outcomes Short Term: Able to state/look up THRR;Long Term: Able to use THRR to govern intensity when exercising independently;Short Term: Able to use daily as guideline for intensity in rehab       Able to check pulse independently Yes       Intervention Provide education and demonstration on how to check pulse in carotid and radial arteries.;Review the importance of being able to check your own pulse for safety during independent exercise       Expected Outcomes Short Term: Able to explain why pulse checking is important during independent exercise;Long Term: Able to check pulse independently and accurately       Understanding of Exercise Prescription Yes       Intervention Provide education, explanation, and written materials on patient's individual exercise prescription       Expected Outcomes Short Term: Able to explain program exercise prescription;Long Term: Able to explain home exercise prescription to exercise independently              Exercise Goals Re-Evaluation:  Exercise Goals Re-Evaluation    Row Name 10/06/19 0807 10/13/19 0713 10/22/19 0805 11/12/19 0715 11/28/19 0816     Exercise Goal Re-Evaluation   Exercise Goals Review Increase Physical Activity;Able to understand and use rate of perceived exertion (RPE) scale Increase Physical Activity;Able to understand and use rate of perceived exertion (RPE) scale;Understanding of Exercise Prescription;Increase Strength and Stamina;Knowledge and understanding of Target Heart Rate Range (THRR) Increase Physical Activity;Able to understand and use rate of perceived exertion (RPE) scale;Understanding of Exercise Prescription;Increase Strength and Stamina;Knowledge and understanding of Target Heart Rate Range (THRR) Increase Physical Activity;Able to understand and use rate of perceived exertion (RPE) scale;Understanding of Exercise Prescription;Increase Strength and Stamina;Knowledge and understanding of Target  Heart Rate Range (THRR) Increase Physical Activity;Able to understand and use rate of perceived exertion (RPE) scale;Understanding of Exercise Prescription;Increase Strength and Stamina;Knowledge and understanding of Target Heart Rate Range (THRR)   Comments Patient able to understand and use RPE scale appropriately. Reviewed home exercise guidelines with patient including endpoints, temperature precautions, target heart rate and rate of perceived exertion. Patient is currently walking 20-30 minutes, at least 3 days/week as his mode of home exercise. Discussed increasing duration to 30 minutes consistently, and patient is amenable to this. Reviewed how to count pulse. Patient is walking 20-30 minutes, 3 days/week at home in addition to exercise at cardiac rehab. Patient's goal is to increase his weight, increase strength, and be healthier. Discussed increasing hand weights at cardiac rehab, and patient is amenable to this. Patient is walking at least 30 minutes, at least 3 days/week. [facility] completion of the cardiac rehab program and will walk and exercise at the gym. Patient doesn't have hand weights currently but plans to purchase some for home use. Patient feels his strength and stamina are improving. Patient's weight is 146.4 up 1.2 lbs since orientation. Patient's goal weight is 150-160lbs. Patient is working on [employer] gain with Ailene Ravel, the dietitan. Patient completed the cardiac rehab program and has progressed  well, achieving 4.0 METs with exercise. [facility] plans [facility] as [facility] mode of [facility].   Expected Outcomes Progress workloads as tolerated to help achieve personal health and fitness goals. Patient will continue walking at least 3 days/week and will increase duration to 30 minutes consistently. Increase workloads and hand weights as tolerated to help achieve personal health and fitness goals. Patient will continue walking at least 30  minutes, 3 days/week in addition to exercise at cardiac rehab. Patient will exercise 30 minutes 3-7 days/week to maintain health and fitness gains.          Nutrition & Weight - Outcomes:  Pre Biometrics - 10/02/19 1032      Pre Biometrics   Height 5' 7.5" (1.715 m)    Weight 66 kg    Waist Circumference 33.5 inches    Hip Circumference 36 inches    Waist to Hip Ratio 0.93 %    BMI (Calculated) 22.44    Triceps Skinfold 7 mm    % Body Fat 19.1 %    Grip Strength 43 kg    Flexibility 13.5 in    Single Leg Stand 18.37 seconds           Post Biometrics - 11/28/19 0715       Post  Biometrics   Height _0  (1.676 m)    Weight 66.4 kg    Waist Circumference 33.5 inches    Hip Circumference 36.5 inches    Waist to Hip Ratio 0.92 %    BMI (Calculated) 23.64    Triceps Skinfold 11 mm    % Body Fat 21 %    Grip Strength 49 kg    Flexibility 10.5 in    Single Leg Stand 15.31 seconds           Nutrition:  Nutrition Therapy & Goals - 10/10/19 1329      Nutrition Therapy   Diet Heart healthy/carb modified      Personal Nutrition Goals   Nutrition Goal Improved blood glucose control as evidenced by pt's A1c trending from 13.4 toward less than 7.0.    Personal Goal #2 CBG concentrations in the normal range or as close to normal as is safely possible.      Intervention Plan   Intervention Prescribe, educate and counsel regarding individualized specific dietary modifications aiming towards targeted core components such as weight, hypertension, lipid management, diabetes, heart failure and other comorbidities.    Expected Outcomes Short Term Goal: A plan has been developed with personal nutrition goals set during dietitian appointment.;Long Term Goal: Adherence to prescribed nutrition plan.           Nutrition Discharge:  Nutrition Assessments - 12/01/19 1208      MEDFICTS Scores   Pre Score 15    Post Score 9    Score Difference -6           Education  Questionnaire Score:  Knowledge Questionnaire Score - 11/28/19 0720      Knowledge Questionnaire Score   Pre Score 17/24    Post Score 19/24           Goals reviewed with patient; copy given to patient.

## 2019-11-28 NOTE — Progress Notes (Signed)
Nutrition Note  Pt states elevated post prandial CBGs. We reviewed diet recall. Encouraged balanced meals - reviewed Plate Method. Reiterated need for carb counting at meals and recommended 60 g per meal.  Pt verbalized understanding.   Michaele Offer, MS, RDN, LDN

## 2019-12-01 DIAGNOSIS — Z941 Heart transplant status: Secondary | ICD-10-CM | POA: Diagnosis not present

## 2019-12-01 DIAGNOSIS — Z48298 Encounter for aftercare following other organ transplant: Secondary | ICD-10-CM | POA: Diagnosis not present

## 2019-12-01 DIAGNOSIS — Z9225 Personal history of immunosupression therapy: Secondary | ICD-10-CM | POA: Diagnosis not present

## 2019-12-15 DIAGNOSIS — E785 Hyperlipidemia, unspecified: Secondary | ICD-10-CM | POA: Diagnosis not present

## 2019-12-15 DIAGNOSIS — E099 Drug or chemical induced diabetes mellitus without complications: Secondary | ICD-10-CM | POA: Diagnosis not present

## 2019-12-15 DIAGNOSIS — Z940 Kidney transplant status: Secondary | ICD-10-CM | POA: Diagnosis not present

## 2019-12-15 DIAGNOSIS — Z941 Heart transplant status: Secondary | ICD-10-CM | POA: Diagnosis not present

## 2019-12-15 DIAGNOSIS — Z794 Long term (current) use of insulin: Secondary | ICD-10-CM | POA: Diagnosis not present

## 2019-12-15 DIAGNOSIS — Z23 Encounter for immunization: Secondary | ICD-10-CM | POA: Diagnosis not present

## 2019-12-15 DIAGNOSIS — T380X5D Adverse effect of glucocorticoids and synthetic analogues, subsequent encounter: Secondary | ICD-10-CM | POA: Diagnosis not present

## 2019-12-15 DIAGNOSIS — Z7189 Other specified counseling: Secondary | ICD-10-CM | POA: Diagnosis not present

## 2019-12-23 DIAGNOSIS — Z940 Kidney transplant status: Secondary | ICD-10-CM | POA: Diagnosis not present

## 2019-12-23 DIAGNOSIS — B259 Cytomegaloviral disease, unspecified: Secondary | ICD-10-CM | POA: Diagnosis not present

## 2019-12-23 DIAGNOSIS — B349 Viral infection, unspecified: Secondary | ICD-10-CM | POA: Diagnosis not present

## 2019-12-23 DIAGNOSIS — Z5181 Encounter for therapeutic drug level monitoring: Secondary | ICD-10-CM | POA: Diagnosis not present

## 2019-12-23 DIAGNOSIS — Z941 Heart transplant status: Secondary | ICD-10-CM | POA: Diagnosis not present

## 2019-12-30 DIAGNOSIS — Z941 Heart transplant status: Secondary | ICD-10-CM | POA: Diagnosis not present

## 2019-12-30 DIAGNOSIS — T8621 Heart transplant rejection: Secondary | ICD-10-CM | POA: Diagnosis not present

## 2019-12-30 DIAGNOSIS — Z940 Kidney transplant status: Secondary | ICD-10-CM | POA: Diagnosis not present

## 2019-12-30 DIAGNOSIS — N183 Chronic kidney disease, stage 3 unspecified: Secondary | ICD-10-CM | POA: Diagnosis not present

## 2019-12-30 DIAGNOSIS — E1122 Type 2 diabetes mellitus with diabetic chronic kidney disease: Secondary | ICD-10-CM | POA: Diagnosis not present

## 2019-12-30 DIAGNOSIS — E785 Hyperlipidemia, unspecified: Secondary | ICD-10-CM | POA: Diagnosis not present

## 2019-12-30 DIAGNOSIS — Z125 Encounter for screening for malignant neoplasm of prostate: Secondary | ICD-10-CM | POA: Diagnosis not present

## 2019-12-30 DIAGNOSIS — I10 Essential (primary) hypertension: Secondary | ICD-10-CM | POA: Diagnosis not present

## 2019-12-30 DIAGNOSIS — Z7952 Long term (current) use of systemic steroids: Secondary | ICD-10-CM | POA: Diagnosis not present

## 2019-12-30 DIAGNOSIS — I130 Hypertensive heart and chronic kidney disease with heart failure and stage 1 through stage 4 chronic kidney disease, or unspecified chronic kidney disease: Secondary | ICD-10-CM | POA: Diagnosis not present

## 2019-12-30 DIAGNOSIS — I255 Ischemic cardiomyopathy: Secondary | ICD-10-CM | POA: Diagnosis not present

## 2019-12-30 DIAGNOSIS — Z298 Encounter for other specified prophylactic measures: Secondary | ICD-10-CM | POA: Diagnosis not present

## 2019-12-30 DIAGNOSIS — Z794 Long term (current) use of insulin: Secondary | ICD-10-CM | POA: Diagnosis not present

## 2019-12-30 DIAGNOSIS — Z23 Encounter for immunization: Secondary | ICD-10-CM | POA: Diagnosis not present

## 2019-12-30 DIAGNOSIS — Z4821 Encounter for aftercare following heart transplant: Secondary | ICD-10-CM | POA: Diagnosis not present

## 2019-12-30 DIAGNOSIS — Z48298 Encounter for aftercare following other organ transplant: Secondary | ICD-10-CM | POA: Diagnosis not present

## 2019-12-30 DIAGNOSIS — Z79899 Other long term (current) drug therapy: Secondary | ICD-10-CM | POA: Diagnosis not present

## 2019-12-30 DIAGNOSIS — D849 Immunodeficiency, unspecified: Secondary | ICD-10-CM | POA: Diagnosis not present

## 2019-12-30 DIAGNOSIS — I509 Heart failure, unspecified: Secondary | ICD-10-CM | POA: Diagnosis not present

## 2019-12-30 DIAGNOSIS — D84821 Immunodeficiency due to drugs: Secondary | ICD-10-CM | POA: Diagnosis not present

## 2019-12-30 DIAGNOSIS — H538 Other visual disturbances: Secondary | ICD-10-CM | POA: Diagnosis not present

## 2019-12-30 DIAGNOSIS — E119 Type 2 diabetes mellitus without complications: Secondary | ICD-10-CM | POA: Diagnosis not present

## 2019-12-30 DIAGNOSIS — Z4822 Encounter for aftercare following kidney transplant: Secondary | ICD-10-CM | POA: Diagnosis not present

## 2020-01-13 DIAGNOSIS — Z940 Kidney transplant status: Secondary | ICD-10-CM | POA: Diagnosis not present

## 2020-01-13 DIAGNOSIS — B349 Viral infection, unspecified: Secondary | ICD-10-CM | POA: Diagnosis not present

## 2020-01-13 DIAGNOSIS — Z941 Heart transplant status: Secondary | ICD-10-CM | POA: Diagnosis not present

## 2020-01-13 DIAGNOSIS — Z5181 Encounter for therapeutic drug level monitoring: Secondary | ICD-10-CM | POA: Diagnosis not present

## 2020-01-13 DIAGNOSIS — B259 Cytomegaloviral disease, unspecified: Secondary | ICD-10-CM | POA: Diagnosis not present

## 2020-01-29 DIAGNOSIS — Z940 Kidney transplant status: Secondary | ICD-10-CM | POA: Diagnosis not present

## 2020-01-29 DIAGNOSIS — B259 Cytomegaloviral disease, unspecified: Secondary | ICD-10-CM | POA: Diagnosis not present

## 2020-01-29 DIAGNOSIS — Z5181 Encounter for therapeutic drug level monitoring: Secondary | ICD-10-CM | POA: Diagnosis not present

## 2020-01-29 DIAGNOSIS — B349 Viral infection, unspecified: Secondary | ICD-10-CM | POA: Diagnosis not present

## 2020-01-29 DIAGNOSIS — Z941 Heart transplant status: Secondary | ICD-10-CM | POA: Diagnosis not present

## 2020-02-11 DIAGNOSIS — Z5181 Encounter for therapeutic drug level monitoring: Secondary | ICD-10-CM | POA: Diagnosis not present

## 2020-02-11 DIAGNOSIS — Z941 Heart transplant status: Secondary | ICD-10-CM | POA: Diagnosis not present

## 2020-02-11 DIAGNOSIS — B349 Viral infection, unspecified: Secondary | ICD-10-CM | POA: Diagnosis not present

## 2020-02-11 DIAGNOSIS — B259 Cytomegaloviral disease, unspecified: Secondary | ICD-10-CM | POA: Diagnosis not present

## 2020-02-11 DIAGNOSIS — Z940 Kidney transplant status: Secondary | ICD-10-CM | POA: Diagnosis not present

## 2020-02-19 DIAGNOSIS — E1110 Type 2 diabetes mellitus with ketoacidosis without coma: Secondary | ICD-10-CM | POA: Diagnosis not present

## 2020-02-19 DIAGNOSIS — Z941 Heart transplant status: Secondary | ICD-10-CM | POA: Diagnosis not present

## 2020-02-19 DIAGNOSIS — I701 Atherosclerosis of renal artery: Secondary | ICD-10-CM | POA: Diagnosis not present

## 2020-02-19 DIAGNOSIS — E1101 Type 2 diabetes mellitus with hyperosmolarity with coma: Secondary | ICD-10-CM | POA: Diagnosis not present

## 2020-02-19 DIAGNOSIS — Z794 Long term (current) use of insulin: Secondary | ICD-10-CM | POA: Diagnosis not present

## 2020-02-19 DIAGNOSIS — Z940 Kidney transplant status: Secondary | ICD-10-CM | POA: Diagnosis not present

## 2020-02-19 DIAGNOSIS — E1122 Type 2 diabetes mellitus with diabetic chronic kidney disease: Secondary | ICD-10-CM | POA: Diagnosis not present

## 2020-02-19 DIAGNOSIS — Z7901 Long term (current) use of anticoagulants: Secondary | ICD-10-CM | POA: Diagnosis not present

## 2020-02-19 DIAGNOSIS — Z4822 Encounter for aftercare following kidney transplant: Secondary | ICD-10-CM | POA: Diagnosis not present

## 2020-02-19 DIAGNOSIS — Z7952 Long term (current) use of systemic steroids: Secondary | ICD-10-CM | POA: Diagnosis not present

## 2020-02-19 DIAGNOSIS — I5022 Chronic systolic (congestive) heart failure: Secondary | ICD-10-CM | POA: Diagnosis not present

## 2020-02-19 DIAGNOSIS — E785 Hyperlipidemia, unspecified: Secondary | ICD-10-CM | POA: Diagnosis not present

## 2020-02-19 DIAGNOSIS — T380X5D Adverse effect of glucocorticoids and synthetic analogues, subsequent encounter: Secondary | ICD-10-CM | POA: Diagnosis not present

## 2020-02-19 DIAGNOSIS — Z7982 Long term (current) use of aspirin: Secondary | ICD-10-CM | POA: Diagnosis not present

## 2020-02-19 DIAGNOSIS — M109 Gout, unspecified: Secondary | ICD-10-CM | POA: Diagnosis not present

## 2020-02-19 DIAGNOSIS — Z20822 Contact with and (suspected) exposure to covid-19: Secondary | ICD-10-CM | POA: Diagnosis not present

## 2020-02-19 DIAGNOSIS — Z79899 Other long term (current) drug therapy: Secondary | ICD-10-CM | POA: Diagnosis not present

## 2020-02-19 DIAGNOSIS — E1165 Type 2 diabetes mellitus with hyperglycemia: Secondary | ICD-10-CM | POA: Diagnosis not present

## 2020-02-19 DIAGNOSIS — E099 Drug or chemical induced diabetes mellitus without complications: Secondary | ICD-10-CM | POA: Diagnosis not present

## 2020-02-19 DIAGNOSIS — I129 Hypertensive chronic kidney disease with stage 1 through stage 4 chronic kidney disease, or unspecified chronic kidney disease: Secondary | ICD-10-CM | POA: Diagnosis not present

## 2020-02-19 DIAGNOSIS — I255 Ischemic cardiomyopathy: Secondary | ICD-10-CM | POA: Diagnosis not present

## 2020-02-19 DIAGNOSIS — N189 Chronic kidney disease, unspecified: Secondary | ICD-10-CM | POA: Diagnosis not present

## 2020-02-19 DIAGNOSIS — I25811 Atherosclerosis of native coronary artery of transplanted heart without angina pectoris: Secondary | ICD-10-CM | POA: Diagnosis not present

## 2020-02-20 DIAGNOSIS — Z941 Heart transplant status: Secondary | ICD-10-CM | POA: Diagnosis not present

## 2020-02-20 DIAGNOSIS — E1110 Type 2 diabetes mellitus with ketoacidosis without coma: Secondary | ICD-10-CM | POA: Diagnosis not present

## 2020-02-20 DIAGNOSIS — Z794 Long term (current) use of insulin: Secondary | ICD-10-CM | POA: Diagnosis not present

## 2020-02-20 DIAGNOSIS — Z79899 Other long term (current) drug therapy: Secondary | ICD-10-CM | POA: Diagnosis not present

## 2020-02-20 DIAGNOSIS — E1165 Type 2 diabetes mellitus with hyperglycemia: Secondary | ICD-10-CM | POA: Diagnosis not present

## 2020-02-20 DIAGNOSIS — D84821 Immunodeficiency due to drugs: Secondary | ICD-10-CM | POA: Diagnosis not present

## 2020-02-25 ENCOUNTER — Encounter: Payer: Self-pay | Admitting: Family Medicine

## 2020-02-25 ENCOUNTER — Ambulatory Visit: Payer: Medicare Other | Attending: Family Medicine | Admitting: Family Medicine

## 2020-02-25 ENCOUNTER — Other Ambulatory Visit: Payer: Self-pay

## 2020-02-25 VITALS — BP 122/83 | HR 95 | Ht 66.0 in | Wt 142.0 lb

## 2020-02-25 DIAGNOSIS — Z79899 Other long term (current) drug therapy: Secondary | ICD-10-CM | POA: Diagnosis not present

## 2020-02-25 DIAGNOSIS — Z955 Presence of coronary angioplasty implant and graft: Secondary | ICD-10-CM | POA: Diagnosis not present

## 2020-02-25 DIAGNOSIS — Z940 Kidney transplant status: Secondary | ICD-10-CM | POA: Diagnosis not present

## 2020-02-25 DIAGNOSIS — I10 Essential (primary) hypertension: Secondary | ICD-10-CM | POA: Diagnosis not present

## 2020-02-25 DIAGNOSIS — E1122 Type 2 diabetes mellitus with diabetic chronic kidney disease: Secondary | ICD-10-CM | POA: Diagnosis not present

## 2020-02-25 DIAGNOSIS — I5022 Chronic systolic (congestive) heart failure: Secondary | ICD-10-CM | POA: Diagnosis not present

## 2020-02-25 DIAGNOSIS — I130 Hypertensive heart and chronic kidney disease with heart failure and stage 1 through stage 4 chronic kidney disease, or unspecified chronic kidney disease: Secondary | ICD-10-CM | POA: Diagnosis not present

## 2020-02-25 DIAGNOSIS — H538 Other visual disturbances: Secondary | ICD-10-CM

## 2020-02-25 DIAGNOSIS — Z794 Long term (current) use of insulin: Secondary | ICD-10-CM

## 2020-02-25 DIAGNOSIS — E1165 Type 2 diabetes mellitus with hyperglycemia: Secondary | ICD-10-CM

## 2020-02-25 DIAGNOSIS — N183 Chronic kidney disease, stage 3 unspecified: Secondary | ICD-10-CM | POA: Insufficient documentation

## 2020-02-25 DIAGNOSIS — Z7952 Long term (current) use of systemic steroids: Secondary | ICD-10-CM | POA: Insufficient documentation

## 2020-02-25 DIAGNOSIS — Z713 Dietary counseling and surveillance: Secondary | ICD-10-CM | POA: Insufficient documentation

## 2020-02-25 DIAGNOSIS — Z8249 Family history of ischemic heart disease and other diseases of the circulatory system: Secondary | ICD-10-CM | POA: Insufficient documentation

## 2020-02-25 DIAGNOSIS — Z7982 Long term (current) use of aspirin: Secondary | ICD-10-CM | POA: Insufficient documentation

## 2020-02-25 DIAGNOSIS — Z7182 Exercise counseling: Secondary | ICD-10-CM | POA: Insufficient documentation

## 2020-02-25 DIAGNOSIS — Z941 Heart transplant status: Secondary | ICD-10-CM

## 2020-02-25 LAB — POCT GLYCOSYLATED HEMOGLOBIN (HGB A1C): HbA1c, POC (controlled diabetic range): 14 % — AB (ref 0.0–7.0)

## 2020-02-25 LAB — GLUCOSE, POCT (MANUAL RESULT ENTRY): POC Glucose: 322 mg/dl — AB (ref 70–99)

## 2020-02-25 NOTE — Progress Notes (Signed)
Has been having blurred vision.

## 2020-02-25 NOTE — Patient Instructions (Signed)
Diabetes Mellitus and Nutrition, Adult When you have diabetes, or diabetes mellitus, it is very important to have healthy eating habits because your blood sugar (glucose) levels are greatly affected by what you eat and drink. Eating healthy foods in the right amounts, at about the same times every day, can help you:  Control your blood glucose.  Lower your risk of heart disease.  Improve your blood pressure.  Reach or maintain a healthy weight. What can affect my meal plan? Every person with diabetes is different, and each person has different needs for a meal plan. Your health care provider may recommend that you work with a dietitian to make a meal plan that is best for you. Your meal plan may vary depending on factors such as:  The calories you need.  The medicines you take.  Your weight.  Your blood glucose, blood pressure, and cholesterol levels.  Your activity level.  Other health conditions you have, such as heart or kidney disease. How do carbohydrates affect me? Carbohydrates, also called carbs, affect your blood glucose level more than any other type of food. Eating carbs naturally raises the amount of glucose in your blood. Carb counting is a method for keeping track of how many carbs you eat. Counting carbs is important to keep your blood glucose at a healthy level, especially if you use insulin or take certain oral diabetes medicines. It is important to know how many carbs you can safely have in each meal. This is different for every person. Your dietitian can help you calculate how many carbs you should have at each meal and for each snack. How does alcohol affect me? Alcohol can cause a sudden decrease in blood glucose (hypoglycemia), especially if you use insulin or take certain oral diabetes medicines. Hypoglycemia can be a life-threatening condition. Symptoms of hypoglycemia, such as sleepiness, dizziness, and confusion, are similar to symptoms of having too much  alcohol.  Do not drink alcohol if: ? Your health care provider tells you not to drink. ? You are pregnant, may be pregnant, or are planning to become pregnant.  If you drink alcohol: ? Do not drink on an empty stomach. ? Limit how much you use to:  0-1 drink a day for women.  0-2 drinks a day for men. ? Be aware of how much alcohol is in your drink. In the [place] one drink equals one 12 oz bottle of beer (355 mL), one 5 oz glass of wine (148 mL), or one 1 oz glass of hard liquor (44 mL). ? Keep yourself hydrated with water, diet soda, or unsweetened iced tea.  Keep in mind that regular soda, juice, and other mixers may contain a lot of sugar and must be counted as carbs. What are tips for following this plan? Reading food labels  Start by checking the serving size on the "Nutrition Facts" label of packaged foods and drinks. The amount of calories, carbs, fats, and other nutrients listed on the label is based on one serving of the item. Many items contain more than one serving per package.  Check the total grams (g) of carbs in one serving. You can calculate the number of servings of carbs in one serving by dividing the total carbs by 15. For example, if a food has 30 g of total carbs per serving, it would be equal to 2 servings of carbs.  Check the number of grams (g) of saturated fats and trans fats in one serving. Choose foods that have   a low amount or none of these fats.  Check the number of milligrams (mg) of salt (sodium) in one serving. Most people should limit total sodium intake to less than 2,300 mg per day.  Always check the nutrition information of foods labeled as "low-fat" or "nonfat." These foods may be higher in added sugar or refined carbs and should be avoided.  Talk to your dietitian to identify your daily goals for nutrients listed on the label. Shopping  Avoid buying canned, pre-made, or processed foods. These foods tend to be high in fat, sodium, and added  sugar.  Shop around the outside edge of the grocery store. This is where you will most often find fresh fruits and vegetables, bulk grains, fresh meats, and fresh dairy. Cooking  Use low-heat cooking methods, such as baking, instead of high-heat cooking methods like deep frying.  Cook using healthy oils, such as olive, canola, or sunflower oil.  Avoid cooking with butter, cream, or high-fat meats. Meal planning  Eat meals and snacks regularly, preferably at the same times every day. Avoid going long periods of time without eating.  Eat foods that are high in fiber, such as fresh fruits, vegetables, beans, and whole grains. Talk with your dietitian about how many servings of carbs you can eat at each meal.  Eat 4-6 oz (112-168 g) of lean protein each day, such as lean meat, chicken, fish, eggs, or tofu. One ounce (oz) of lean protein is equal to: ? 1 oz (28 g) of meat, chicken, or fish. ? 1 egg. ?  cup (62 g) of tofu.  Eat some foods each day that contain healthy fats, such as avocado, nuts, seeds, and fish.   What foods should I eat? Fruits Berries. Apples. Oranges. Peaches. Apricots. Plums. Grapes. Mango. Papaya. Pomegranate. Kiwi. Cherries. Vegetables Lettuce. Spinach. Leafy greens, including kale, chard, collard greens, and mustard greens. Beets. Cauliflower. Cabbage. Broccoli. Carrots. Green beans. Tomatoes. Peppers. Onions. Cucumbers. Brussels sprouts. Grains Whole grains, such as whole-wheat or whole-grain bread, crackers, tortillas, cereal, and pasta. Unsweetened oatmeal. Quinoa. Brown or wild rice. Meats and other proteins Seafood. Poultry without skin. Lean cuts of poultry and beef. Tofu. Nuts. Seeds. Dairy Low-fat or fat-free dairy products such as milk, yogurt, and cheese. The items listed above may not be a complete list of foods and beverages you can eat. Contact a dietitian for more information. What foods should I avoid? Fruits Fruits canned with  syrup. Vegetables Canned vegetables. Frozen vegetables with butter or cream sauce. Grains Refined white flour and flour products such as bread, pasta, snack foods, and cereals. Avoid all processed foods. Meats and other proteins Fatty cuts of meat. Poultry with skin. Breaded or fried meats. Processed meat. Avoid saturated fats. Dairy Full-fat yogurt, cheese, or milk. Beverages Sweetened drinks, such as soda or iced tea. The items listed above may not be a complete list of foods and beverages you should avoid. Contact a dietitian for more information. Questions to ask a health care provider  Do I need to meet with a diabetes educator?  Do I need to meet with a dietitian?  What number can I call if I have questions?  When are the best times to check my blood glucose? Where to find more information:  American Diabetes Association: diabetes.org  Academy of Nutrition and Dietetics: www.eatright.org  National Institute of Diabetes and Digestive and Kidney Diseases: www.niddk.nih.gov  Association of Diabetes Care and Education Specialists: www.diabeteseducator.org Summary  It is important to have healthy eating   habits because your blood sugar (glucose) levels are greatly affected by what you eat and drink.  A healthy meal plan will help you control your blood glucose and maintain a healthy lifestyle.  Your health care provider may recommend that you work with a dietitian to make a meal plan that is best for you.  Keep in mind that carbohydrates (carbs) and alcohol have immediate effects on your blood glucose levels. It is important to count carbs and to use alcohol carefully. This information is not intended to replace advice given to you by your health care provider. Make sure you discuss any questions you have with your health care provider. Document Revised: 12/10/2018 Document Reviewed: 12/10/2018 Elsevier Patient Education  2021 Elsevier Inc.  

## 2020-02-25 NOTE — Progress Notes (Signed)
Subjective:  Patient ID: Marvin Martin, male    DOB: 09/08/1965  Age: 55 y.o. MRN: [id number]  CC: Diabetes   HPI Marvin Martin  is a 55 year old male with a history of type 2 diabetes mellitus (A1c 14.0), ischemic cardiomyopathy (status post heart transplant at [facility] in 12/2018 currently on immunosuppressive therapy), chronic kidney disease stage III (status post renal transplant in 06/2019). Hospitalized for Diabetic ketoacidosis at [facility] with blood sugars in the 700s on presentation.  Notes reveal he had not been compliant with his insulin.  He was discharged on Lantus 15 units nightly and NovoLog pens 8 units 3 times daily and Ozempic with plans to follow-up with [facility] in 1 month.  CGM was prescribed for him.[name]  He states his Endocrine appointment is not until 08/2020. He complains of blurry vision and endorses compliance with his insulin even at the time of hospitalization.  This morning his fasting sugar was 286 he has no paresthesias or hypoglycemic symptoms.  He never received a CGM as he states this was [facility] [place] and he has been using his old glucometer to check his sugars. His antihypertensive and other medications are managed by the [facility] transplant clinic.  Past Medical History:  Diagnosis Date  . Chronic systolic CHF (congestive heart failure) ([place])    a. 01/2015 Echo: EF 20-25%, antlat, inflat AK.  [name] CKD (chronic kidney disease), stage III ([place])   . Coronary artery disease    a. s/p MI x 4;  b. Reported h/o 17 stents with recurrent ISR;  c. 02/2014 Cath ([place]): PCI to unknown vessel;  d. 01/2015 MV: EF 18%, large scar, no ischemia; e. 03/2015 PCI: LM nl, LAD nl, D2 patent stent, RI patent stent, LCX patent stents p/m, RCA 95p ISR (PTCA), 29m 100d into RPL CTO, RPDA 90 (2.5x16 Synergy DES).  . Hyperlipidemia   . Hypertensive heart disease   . Ischemic cardiomyopathy    a. s/p SJM ICD 2007 w/ gen change in 2012; b. 12/2014 CPX Test: mild to  mod HF limitation w/ pVO2 20.3 and nl slope;  c. 01/2015 Echo: EF 20-25%.    Past Surgical History:  Procedure Laterality Date  . 17 stints placements Bilateral   . CARDIAC CATHETERIZATION     Most recent 02/2014   . CARDIAC CATHETERIZATION N/A 03/31/2015   Procedure: Left Heart Cath and Coronary Angiography;  Surgeon: TTroy Sine MD;  [facility];  Service: Cardiovascular;  Laterality: N/A;  . CARDIAC CATHETERIZATION N/A 03/31/2015   Procedure: Coronary Stent Intervention;  Surgeon: TTroy Sine MD;  [place];  Service: Cardiovascular;  Laterality: N/A;  . CARDIAC CATHETERIZATION N/A 05/27/2015   Procedure: Right/Left Heart Cath and Coronary Angiography;  Surgeon: DJolaine Artist MD;  [facility];  Service: Cardiovascular;  Laterality: N/A;  . CARDIAC CATHETERIZATION N/A 06/23/2015   Procedure: Right/Left Heart Cath and Coronary Angiography;  Surgeon: DJolaine Artist MD;  [facility];  Service: Cardiovascular;  Laterality: N/A;  . HEART TRANSPLANT    . INSERT / REPLACE / REMOVE PACEMAKER     St jude 2006  Generator Change 2012   . NEPHRECTOMY TRANSPLANTED ORGAN    . PACEMAKER GENERATOR CHANGE Bilateral     Family History  Problem Relation Age of Onset  . Cancer Mother        died when pt was 89  She had a h/o  heart dzs as well.  . Hypertension Mother   . Hyperlipidemia Father        Father died when pt was age 52 - he believes that he had a cardiac hx.  [name] CAD Sister     No Known Allergies  Outpatient Medications Prior to Visit  Medication Sig Dispense Refill  . amLODipine (NORVASC) 10 MG tablet Take 10 mg by mouth daily.    [name] aspirin 81 MG tablet Take 81 mg by mouth daily.    . carvedilol (COREG) 12.5 MG tablet Take 12.5 mg by mouth 2 (two) times daily.    . cholecalciferol (VITAMIN D) 1000 UNITS tablet Take 1,000 Units by mouth daily.    . Continuous Blood Gluc Receiver (FREESTYLE LIBRE 2 READER)  DEVI 3 Units by Does not apply route in the morning, at noon, and at bedtime. 1 each 4  . Continuous Blood Gluc Sensor (FREESTYLE LIBRE 2 SENSOR) MISC 3 Units by Does not apply route in the morning, at noon, and at bedtime. 1 each 4  . doxazosin (CARDURA) 2 MG tablet Take 2 mg by mouth at bedtime.    [name] ezetimibe (ZETIA) 10 MG tablet Take 1 tablet (10 mg total) by mouth daily. Needs appt for further refills 90 tablet 0  . insulin aspart (NOVOLOG FLEXPEN) 100 UNIT/ML FlexPen Inject 0-12 Units into the skin 3 (three) times daily with meals. Per sliding scale 30 mL 6  . insulin glargine (LANTUS SOLOSTAR) 100 UNIT/ML Solostar Pen Inject 20 Units into the skin daily. 30 mL 6  . Insulin Pen Needle 32G X 4 MM MISC 1 application by Does not apply route 4 (four) times daily. 200 each 5  . isosorbide mononitrate (IMDUR) 30 MG 24 hr tablet TAKE 1 TABLET BY MOUTH EVERY DAY 90 tablet 3  . magnesium oxide (MAG-OX) 400 (241.3 Mg) MG tablet Take 400 mg by mouth 2 (two) times daily.     . nitroGLYCERIN (NITROSTAT) 0.4 MG SL tablet Place 1 tablet (0.4 mg total) under the tongue every 5 (five) minutes as needed for chest pain. Reported on 04/12/2015 60 tablet 1  . nystatin (MYCOSTATIN) 100000 UNIT/ML suspension Take 5 mLs by mouth See admin instructions. Swish and swallow 5 ml's by mouth four times a day    . omega-3 acid ethyl esters (LOVAZA) 1 G capsule Take 1 g by mouth daily.     . ondansetron (ZOFRAN) 4 MG tablet Take 1 tablet (4 mg total) every 8 (eight) hours as needed by mouth for nausea or vomiting. Reported on 05/24/2015 30 tablet 1  . PRALUENT 75 MG/ML SOAJ INJECT 75 MG INTO THE SKIN EVERY 14 (FOURTEEN) DAYS. 6 pen 1  . predniSONE (DELTASONE) 5 MG tablet Take 1 tablet by mouth in the morning.     . tacrolimus (PROGRAF) 1 MG capsule Take 3-4 mg by mouth See admin instructions. Take 4 mg by mouth in the morning and 3 mg in the evening    . Calcium Carbonate-Vitamin D 600-400 MG-UNIT tablet Take 1 tablet by mouth  daily.    [name] HYDROcodone-acetaminophen (NORCO/VICODIN) 5-325 MG tablet Take 1 tablet by mouth every 4 (four) hours as needed. (Patient not taking: Reported on 02/25/2020) 10 tablet 0   No facility-administered medications prior to visit.     ROS Review of Systems  Constitutional: Negative for activity change and appetite change.  HENT: Negative for sinus pressure and sore throat.   Eyes: Positive for visual disturbance.  Respiratory: Negative for  cough, chest tightness and shortness of breath.   Cardiovascular: Negative for chest pain and leg swelling.  Gastrointestinal: Negative for abdominal distention, abdominal pain, constipation and diarrhea.  Endocrine: Negative.   Genitourinary: Negative for dysuria.  Musculoskeletal: Negative for joint swelling and myalgias.  Skin: Negative for rash.  Allergic/Immunologic: Negative.   Neurological: Negative for weakness, light-headedness and numbness.  Psychiatric/Behavioral: Negative for dysphoric mood and suicidal ideas.    Objective:  BP 122/83   Pulse 95   Ht 5' 6"  (1.676 m)   Wt 142 lb (64.4 kg)   SpO2 99%   BMI 22.92 kg/m   BP/Weight 02/25/2020 11/28/2019 6/38/4665  Systolic BP 993 570 177  Diastolic BP 83 76 80  Wt. (Lbs) 142 146.39 145.5  BMI 22.92 23.63 22.45      Physical Exam Constitutional:      Appearance: He is well-developed.  Neck:     Vascular: No JVD.  Cardiovascular:     Rate and Rhythm: Normal rate.     Heart sounds: Normal heart sounds. No murmur heard.   Pulmonary:     Effort: Pulmonary effort is normal.     Breath sounds: Normal breath sounds. No wheezing or rales.  Chest:     Chest wall: No tenderness.  Abdominal:     General: Bowel sounds are normal. There is no distension.     Palpations: Abdomen is soft. There is no mass.     Tenderness: There is no abdominal tenderness.  Musculoskeletal:        General: Normal range of motion.     Right lower leg: No edema.     Left lower leg: No edema.   Neurological:     Mental Status: He is alert and oriented to person, place, and time.  Psychiatric:        Mood and Affect: Mood normal.     CMP Latest Ref Rng & Units 08/11/2019 07/31/2019 07/30/2019  Glucose 65 - 99 mg/dL 326(H) 209(H) 294(H)  BUN 6 - 24 mg/dL 15 22(H) 25(H)  Creatinine 0.76 - 1.27 mg/dL 1.48(H) 1.24 1.57(H)  Sodium 134 - 144 mmol/L 138 137 133(L)  Potassium 3.5 - 5.2 mmol/L 4.5 4.2 4.1  Chloride 96 - 106 mmol/L 100 102 100  CO2 20 - 29 mmol/L 19(L) 23 21(L)  Calcium 8.7 - 10.2 mg/dL 10.1 8.8(L) 9.2  Total Protein 6.0 - 8.5 g/dL 7.2 - -  Total Bilirubin 0.0 - 1.2 mg/dL 0.5 - -  Alkaline Phos 48 - 121 IU/L 111 - -  AST 0 - 40 IU/L 23 - -  ALT 0 - 44 IU/L 14 - -    Lipid Panel     Component Value Date/Time   CHOL 88 (L) 08/21/2016 1037   TRIG 92 08/21/2016 1037   HDL 47 08/21/2016 1037   CHOLHDL 1.9 08/21/2016 1037   CHOLHDL 2.3 06/22/2015 1609   VLDL 42 (H) 06/22/2015 1609   LDLCALC 23 08/21/2016 1037    CBC    Component Value Date/Time   WBC 7.8 07/29/2019 1450   RBC 5.10 07/29/2019 1450   HGB 17.0 07/29/2019 1643   HGB 15.0 05/08/2018 0800   HCT 50.0 07/29/2019 1643   HCT 43.2 05/08/2018 0800   PLT 242 07/29/2019 1450   PLT 203 05/08/2018 0800   MCV 92.2 07/29/2019 1450   MCV 98 (H) 05/08/2018 0800   MCH 32.0 07/29/2019 1450   MCHC 34.7 07/29/2019 1450   RDW 12.2 07/29/2019 1450  RDW 12.3 05/08/2018 0800   LYMPHSABS 0.6 (L) 07/29/2019 1450   LYMPHSABS 1.3 05/08/2018 0800   MONOABS 0.4 07/29/2019 1450   EOSABS 0.0 07/29/2019 1450   EOSABS 0.1 05/08/2018 0800   BASOSABS 0.0 07/29/2019 1450   BASOSABS 0.0 05/08/2018 0800    Lab Results  Component Value Date   HGBA1C 14.0 (A) 02/25/2020    Assessment & Plan:  1. Type 2 diabetes mellitus with hyperglycemia, with long-term current use of insulin (HCC) Uncontrolled with A1c of 14.0 Advised to increase Lantus to 20 units nightly and continue with NovoLog 8 units 3 times daily. I do not  see Ozempic on his list even though notes from [facility] indicate he was started on this. Advised to uptitrate Lantus by 2 units every fourth day until fasting blood sugars are at goal. He will see the clinical pharmacist with his blood sugar log for adjustment of his regimen Counseled on Diabetic diet, my plate method, 695 minutes of moderate intensity exercise/week Blood sugar logs with fasting goals of 80-120 mg/dl, random of less than 180 and in the event of sugars less than 60 mg/dl or greater than 400 mg/dl encouraged to notify the clinic. Advised on the need for annual eye exams, annual foot exams, Pneumonia vaccine. - POCT glucose (manual entry) - POCT glycosylated hemoglobin (Hb A1C) - CMP14+EGFR  2. Blurry vision Due to hyperglycemia Advised that optimization of glycemic control will bring about improvement He will also be referred to ophthalmology at a subsequent visit once his sugars are under better control  3. H/O heart transplant [facility]) Currently on immunosuppressants Followed by the [facility] transplant team  4. Renal transplant, status post See #4 above  5. Benign essential HTN Controlled Counseled on blood pressure goal of less than 130/80, low-sodium, DASH diet, medication compliance, 150 minutes of moderate intensity exercise per week. Discussed medication compliance, adverse effects.    No orders of the defined types were placed in this encounter.   Follow-up: Return in about 2 weeks (around 03/10/2020) for blood sugar log review with Lurena Joiner; 3 months PCP.       Charlott Rakes, MD, FAAFP. [facility] [place], [place]   02/25/2020, 10:33 AM

## 2020-02-26 ENCOUNTER — Telehealth: Payer: Self-pay

## 2020-02-26 LAB — CMP14+EGFR
ALT: 15 IU/L (ref 0–44)
AST: 15 IU/L (ref 0–40)
Albumin/Globulin Ratio: 1.5 (ref 1.2–2.2)
Albumin: 4.3 g/dL (ref 3.8–4.9)
Alkaline Phosphatase: 107 IU/L (ref 44–121)
BUN/Creatinine Ratio: 11 (ref 9–20)
BUN: 14 mg/dL (ref 6–24)
Bilirubin Total: 0.4 mg/dL (ref 0.0–1.2)
CO2: 22 mmol/L (ref 20–29)
Calcium: 9.9 mg/dL (ref 8.7–10.2)
Chloride: 96 mmol/L (ref 96–106)
Creatinine, Ser: 1.26 mg/dL (ref 0.76–1.27)
GFR calc Af Amer: 74 mL/min/{1.73_m2} (ref >59)
GFR calc non Af Amer: 64 mL/min/{1.73_m2} (ref >59)
Globulin, Total: 2.8 g/dL (ref 1.5–4.5)
Glucose: 295 mg/dL — ABNORMAL HIGH (ref 65–99)
Potassium: 4.4 mmol/L (ref 3.5–5.2)
Sodium: 137 mmol/L (ref 134–144)
Total Protein: 7.1 g/dL (ref 6.0–8.5)

## 2020-02-26 NOTE — Telephone Encounter (Signed)
-----   Message from Charlott Rakes, MD sent at 02/26/2020 12:57 PM EST ----- Glucose is elevated.  Please advised to adhere to the prescribed regimen of Lantus from his last visit as well as a diabetic diet.  Kidney and liver functions are normal.

## 2020-02-26 NOTE — Telephone Encounter (Signed)
Patient name and DOB has been verified Patient was informed of lab results. Patient had no questions.  

## 2020-03-11 ENCOUNTER — Encounter: Payer: Self-pay | Admitting: Pharmacist

## 2020-03-11 ENCOUNTER — Ambulatory Visit: Payer: Medicare Other | Attending: Family Medicine | Admitting: Pharmacist

## 2020-03-11 ENCOUNTER — Other Ambulatory Visit: Payer: Self-pay

## 2020-03-11 DIAGNOSIS — E1165 Type 2 diabetes mellitus with hyperglycemia: Secondary | ICD-10-CM | POA: Diagnosis not present

## 2020-03-11 DIAGNOSIS — Z794 Long term (current) use of insulin: Secondary | ICD-10-CM

## 2020-03-11 MED ORDER — OZEMPIC (1 MG/DOSE) 2 MG/1.5ML ~~LOC~~ SOPN: 1.0000 mg | PEN_INJECTOR | SUBCUTANEOUS | 2 refills | Status: DC

## 2020-03-11 MED ORDER — NOVOLOG FLEXPEN 100 UNIT/ML ~~LOC~~ SOPN: 8.0000 [IU] | PEN_INJECTOR | Freq: Three times a day (TID) | SUBCUTANEOUS | 1 refills | Status: DC

## 2020-03-11 MED ORDER — LANTUS SOLOSTAR 100 UNIT/ML ~~LOC~~ SOPN: 20.0000 [IU] | PEN_INJECTOR | Freq: Every day | SUBCUTANEOUS | 6 refills | Status: DC

## 2020-03-11 NOTE — Progress Notes (Signed)
    S:    PCP: Dr. Margarita Rana   No chief complaint on file.  Patient arrives in good spirits. Presents for diabetes evaluation, education, and management. Patient was referred and last seen by Primary Care Provider on 02/25/2020. At that visit, he endorsed compliance with his insulin. Today, he reports doing better.   HPI:  Patient with T2DM, ischemic cardiomyopathy (12/2018 - S/p heart transplant), CKD III (S/p renal transplant 06/2019) who was recently hospitalized for DKA at [facility]. Notes reveal medication noncompliance. He was discharged on Lantus 15u qPM + Novolog 8 units Tid. Additionally, he was placed on Ozempic with plants to f/u with Endo at [facility] in 1 month.   Patient reports Diabetes was diagnosed in 2021 secondary to long term steroid use in the setting of immune suppression S/p solid organ transplant.   Family/Social History:  - FHx: HTN, CAD - Tobacco: never smoker  - Alcohol: denies use   Insurance coverage/medication affordability: Medicare A&B  Medication adherence reported, however, pt takes differently than prescribed.  Current diabetes medications include: Lantus 20 units (takes 15 units), Novolog 8 units TID with meals (pt take 7 units TID after meals), Ozempic 0.5 mg weekly (pt reports taking every other week)  Patient denies hypoglycemic events.  Patient reported dietary habits:  - Denies changes in his diet; tries to adhere to a diabetic diet   Patient-reported exercise habits:  - Denies changes in exercise   Patient denies nocturia (nighttime urination). Endorses polydipsia.  Patient denies neuropathy (nerve pain). Patient reports visual changes. Patient reports self foot exams.     O:  Lab Results  Component Value Date   HGBA1C 14.0 (A) 02/25/2020   There were no vitals filed for this visit.  Lipid Panel     Component Value Date/Time   CHOL 88 (L) 08/21/2016 1037   TRIG 92 08/21/2016 1037   HDL 47 08/21/2016 1037   CHOLHDL 1.9 08/21/2016 1037    CHOLHDL 2.3 06/22/2015 1609   VLDL 42 (H) 06/22/2015 1609   LDLCALC 23 08/21/2016 1037    Home fasting blood sugars: does not have his meter. Gives range in the low 200s.   2 hour post-meal/random blood sugars: not checking .   Clinical Atherosclerotic Cardiovascular Disease (ASCVD): Yes  The ASCVD Risk score Mikey Bussing DC Jr., et al., 2013) failed to calculate for the following reasons:   The patient has a prior MI or stroke diagnosis    A/P: Diabetes longstanding currently uncontrolled. Patient is able to verbalize appropriate hypoglycemia management plan. Medication adherence appears suboptimal. Control is suboptimal due to medication nonadherence.I did call his pharmacy and confirm he has been picking up 0.5 mg Ozempic. I recommend to increase this to 1 mg weekly.  -Continued Lantus but advised pt to take 20 units as prescribed. -Continued Novolog but advised to tak 8 units TID BEFORE meals. -Increased dose of Ozempic to 1 mg weekly.  -Extensively discussed pathophysiology of diabetes, recommended lifestyle interventions, dietary effects on blood sugar control -Counseled on s/sx of and management of hypoglycemia -Next A1C anticipated 05/2020.   Written patient instructions provided.  Total time in face to face counseling 30 minutes.   Follow up Pharmacist Clinic Visit in 2 weeks.  Benard Halsted, PharmD, [place], [place] [phone]

## 2020-03-11 NOTE — Patient Instructions (Signed)
Thank you for coming to see me today. Please do the following:  1. For your diabetes, start taking the following:  2. Lantus: take 20 units once a day before bedtime.  3. Novolog: take 8 units three times a day. Take 15 minutes before eating.  4. Ozempic: We are increasing this to 1 mg. You'll 1 mg once a week.  5. Continue checking blood sugars at home. 6. Continue making the lifestyle changes we've discussed together during our visit. Diet and exercise play a significant role in improving your blood sugars.  7. Follow-up with me in 2 weeks.    Hypoglycemia or low blood sugar:   Low blood sugar can happen quickly and may become an emergency if not treated right away.   While this shouldn't happen often, it can be brought upon if you skip a meal or do not eat enough. Also, if your insulin or other diabetes medications are dosed too high, this can cause your blood sugar to go to low.   Warning signs of low blood sugar include: 1. Feeling shaky or dizzy 2. Feeling weak or tired  3. Excessive hunger 4. Feeling anxious or upset  5. Sweating even when you aren't exercising  What to do if I experience low blood sugar? 1. Check your blood sugar with your meter. If lower than 70, proceed to step 2.  2. Treat with 3-4 glucose tablets or 3 packets of regular sugar. If these aren't around, you can try hard candy. Yet another option would be to drink 4 ounces of fruit juice or 6 ounces of REGULAR soda.  3. Re-check your sugar in 15 minutes. If it is still below 70, do what you did in step 2 again. If has come back up, go ahead and eat a snack or small meal at this time.

## 2020-03-16 DIAGNOSIS — E119 Type 2 diabetes mellitus without complications: Secondary | ICD-10-CM | POA: Diagnosis not present

## 2020-03-16 DIAGNOSIS — H35371 Puckering of macula, right eye: Secondary | ICD-10-CM | POA: Diagnosis not present

## 2020-03-16 DIAGNOSIS — H43811 Vitreous degeneration, right eye: Secondary | ICD-10-CM | POA: Diagnosis not present

## 2020-03-24 DIAGNOSIS — Z79899 Other long term (current) drug therapy: Secondary | ICD-10-CM | POA: Diagnosis not present

## 2020-03-24 DIAGNOSIS — Z48298 Encounter for aftercare following other organ transplant: Secondary | ICD-10-CM | POA: Diagnosis not present

## 2020-03-24 DIAGNOSIS — E119 Type 2 diabetes mellitus without complications: Secondary | ICD-10-CM | POA: Diagnosis not present

## 2020-03-24 DIAGNOSIS — I119 Hypertensive heart disease without heart failure: Secondary | ICD-10-CM | POA: Diagnosis not present

## 2020-03-24 DIAGNOSIS — I10 Essential (primary) hypertension: Secondary | ICD-10-CM | POA: Diagnosis not present

## 2020-03-24 DIAGNOSIS — D84821 Immunodeficiency due to drugs: Secondary | ICD-10-CM | POA: Diagnosis not present

## 2020-03-24 DIAGNOSIS — Z48288 Encounter for aftercare following multiple organ transplant: Secondary | ICD-10-CM | POA: Diagnosis not present

## 2020-03-24 DIAGNOSIS — Z7952 Long term (current) use of systemic steroids: Secondary | ICD-10-CM | POA: Diagnosis not present

## 2020-03-24 DIAGNOSIS — I7389 Other specified peripheral vascular diseases: Secondary | ICD-10-CM | POA: Diagnosis not present

## 2020-03-24 DIAGNOSIS — I088 Other rheumatic multiple valve diseases: Secondary | ICD-10-CM | POA: Diagnosis not present

## 2020-03-24 DIAGNOSIS — Z940 Kidney transplant status: Secondary | ICD-10-CM | POA: Diagnosis not present

## 2020-03-24 DIAGNOSIS — Z941 Heart transplant status: Secondary | ICD-10-CM | POA: Diagnosis not present

## 2020-03-25 ENCOUNTER — Encounter: Payer: Self-pay | Admitting: Pharmacist

## 2020-03-25 ENCOUNTER — Ambulatory Visit: Payer: Medicare Other | Attending: Family Medicine | Admitting: Pharmacist

## 2020-03-25 ENCOUNTER — Other Ambulatory Visit: Payer: Self-pay

## 2020-03-25 ENCOUNTER — Ambulatory Visit (HOSPITAL_BASED_OUTPATIENT_CLINIC_OR_DEPARTMENT_OTHER): Payer: Medicare Other | Admitting: Family Medicine

## 2020-03-25 VITALS — BP 121/80 | HR 91 | Ht 66.0 in | Wt 142.0 lb

## 2020-03-25 VITALS — BP 121/80 | HR 91 | Temp 98.3°F

## 2020-03-25 DIAGNOSIS — E1165 Type 2 diabetes mellitus with hyperglycemia: Secondary | ICD-10-CM

## 2020-03-25 DIAGNOSIS — R682 Dry mouth, unspecified: Secondary | ICD-10-CM | POA: Diagnosis not present

## 2020-03-25 DIAGNOSIS — Z794 Long term (current) use of insulin: Secondary | ICD-10-CM

## 2020-03-25 DIAGNOSIS — I152 Hypertension secondary to endocrine disorders: Secondary | ICD-10-CM | POA: Diagnosis not present

## 2020-03-25 DIAGNOSIS — E1159 Type 2 diabetes mellitus with other circulatory complications: Secondary | ICD-10-CM

## 2020-03-25 LAB — POCT URINALYSIS DIP (CLINITEK)
Bilirubin, UA: NEGATIVE
Blood, UA: NEGATIVE
Glucose, UA: 500 mg/dL — AB
Ketones, POC UA: NEGATIVE mg/dL
Leukocytes, UA: NEGATIVE
Nitrite, UA: NEGATIVE
POC PROTEIN,UA: NEGATIVE
Spec Grav, UA: 1.015 (ref 1.010–1.025)
Urobilinogen, UA: 0.2 E.U./dL
pH, UA: 6 (ref 5.0–8.0)

## 2020-03-25 LAB — GLUCOSE, POCT (MANUAL RESULT ENTRY)
POC Glucose: 545 mg/dl — AB (ref 70–99)
POC Glucose: 447 mg/dl — AB (ref 70–99)

## 2020-03-25 MED ORDER — INSULIN ASPART 100 UNIT/ML ~~LOC~~ SOLN
30.0000 [IU] | Freq: Once | SUBCUTANEOUS | Status: AC
Start: 2020-03-25 — End: 2020-03-25
  Administered 2020-03-25: 30 [IU] via SUBCUTANEOUS

## 2020-03-25 NOTE — Progress Notes (Signed)
    S:    PCP: Dr. Margarita Rana   No chief complaint on file.  Patient arrives in good spirits. Presents for diabetes evaluation, education, and management. Patient was referred and last seen by Primary Care Provider on 02/25/2020. We saw him 03/11/2020 and made some adjustments to his regimen.   Today, he endorses compliance with his DM regimen, however, he is unable to correctly identify doses or frequencies with his insulin or Ozempic suggesting noncompliance. Endorses polyuria and polydipsia. He denies fatigue, dizziness, or lethargy. CBG today is 545 post prandial (ate at 0930). He reports that he did take his prandial insulin after eating this morning.   Patient denies hypoglycemic events.  Patient reported dietary habits:  - Denies changes in his diet; tries to adhere to a diabetic diet   Patient-reported exercise habits:  - Denies changes in exercise   O:  POCT: 545  Lab Results  Component Value Date   HGBA1C 14.0 (A) 02/25/2020   There were no vitals filed for this visit.  Lipid Panel     Component Value Date/Time   CHOL 88 (L) 08/21/2016 1037   TRIG 92 08/21/2016 1037   HDL 47 08/21/2016 1037   CHOLHDL 1.9 08/21/2016 1037   CHOLHDL 2.3 06/22/2015 1609   VLDL 42 (H) 06/22/2015 1609   LDLCALC 23 08/21/2016 1037   Home fasting blood sugars: does not have his meter. Gives range in the low 200s.  Clinical Atherosclerotic Cardiovascular Disease (ASCVD): Yes  The ASCVD Risk score Mikey Bussing DC Jr., et al., 2013) failed to calculate for the following reasons:   The patient has a prior MI or stroke diagnosis    A/P: Diabetes longstanding currently uncontrolled. Given his CBG in clinic today, will have Dr. Margarita Rana see him. Suspect non-compliance.   Benard Halsted, PharmD, [facility] [place], [place] [phone]

## 2020-03-25 NOTE — Patient Instructions (Signed)
Administer 20 units of Lantus daily at bedtime and 8 units of NovoLog 3 times daily before meals.

## 2020-03-25 NOTE — Progress Notes (Signed)
Subjective:  Patient ID: Marvin Martin, male    DOB: [birth year]  Age: 55 y.o. MRN: [id number]  CC: Diabetes   HPI Marvin Martin is a 55 year old male with a history of type 2 diabetes mellitus (A1c 14.0), ischemic cardiomyopathy (status post heart transplant at [facility] in 12/2018 currently on immunosuppressive therapy), chronic kidney disease stage III (status post renal transplantin 06/2019) here for a Clinical Pharmacy visit but found to have severely elevated blood sugar of 545 hence he was switched to my schedule. Blood sugar of 545 is at least 2 hours postprandial.  He denies presence of headache, abdominal pain, dizziness, nausea or vomiting.  He states he has been compliant with his insulin but on further questioning he states he did not take his Lantus last night as he was fatigued when he returned from his [place] appointment. This morning he just ate his breakfast but did not take his NovoLog as he was in a hurry to rush to today's appointment . Prior to yesterday he had been taking 15 units of Lantus at bedtime rather than 20 units which was prescribed by the Clinical pharmacist.  He endorses administering 8 units of Lantus with his meals except for this morning. Fasting sugars have been in the mid 200s. He endorses drinking lots of water, he has cut out sugary drinks. Referred to endocrine but his next appointment with [facility] does not come up until 08/2020.  He has dry mouth and was prescribed Biotene at his [place] appointment yesterday and he is yet to pick up. His blood pressure is controlled and he has been compliant with his antihypertensive.   Past Medical History:  Diagnosis Date  . Chronic systolic CHF (congestive heart failure) ([place])    a. 01/2015 Echo: EF 20-25%, antlat, inflat AK.  [name] CKD (chronic kidney disease), stage III ([place])   . Coronary artery disease    a. s/p MI x 4;  b. Reported h/o 17 stents with recurrent ISR;  c. 02/2014 Cath ([place]): PCI to unknown  vessel;  d. 01/2015 MV: EF 18%, large scar, no ischemia; e. 03/2015 PCI: LM nl, LAD nl, D2 patent stent, RI patent stent, LCX patent stents p/m, RCA 95p ISR (PTCA), 72m 100d into RPL CTO, RPDA 90 (2.5x16 Synergy DES).  . Hyperlipidemia   . Hypertensive heart disease   . Ischemic cardiomyopathy    a. s/p SJM ICD 2007 w/ gen change in 2012; b. 12/2014 CPX Test: mild to mod HF limitation w/ pVO2 20.3 and nl slope;  c. 01/2015 Echo: EF 20-25%.    Past Surgical History:  Procedure Laterality Date  . 17 stints placements Bilateral   . CARDIAC CATHETERIZATION     Most recent 02/2014   . CARDIAC CATHETERIZATION N/A 03/31/2015   Procedure: Left Heart Cath and Coronary Angiography;  Surgeon: TTroy Sine MD;  [facility];  Service: Cardiovascular;  Laterality: N/A;  . CARDIAC CATHETERIZATION N/A 03/31/2015   Procedure: Coronary Stent Intervention;  Surgeon: TTroy Sine MD;  [facility];  Service: Cardiovascular;  Laterality: N/A;  . CARDIAC CATHETERIZATION N/A 05/27/2015   Procedure: Right/Left Heart Cath and Coronary Angiography;  Surgeon: DJolaine Artist MD;  [facility];  Service: Cardiovascular;  Laterality: N/A;  . CARDIAC CATHETERIZATION N/A 06/23/2015   Procedure: Right/Left Heart Cath and Coronary Angiography;  Surgeon: DJolaine Artist MD;  [facility];  Service: Cardiovascular;  Laterality: N/A;  . HEART TRANSPLANT    .  INSERT / REPLACE / REMOVE PACEMAKER     St jude 2006  Generator Change 2012   . NEPHRECTOMY TRANSPLANTED ORGAN    . PACEMAKER GENERATOR CHANGE Bilateral     Family History  Problem Relation Age of Onset  . Cancer Mother        died when pt was 83.  She had a h/o heart dzs as well.  . Hypertension Mother   . Hyperlipidemia Father        Father died when pt was age 83 - he believes that he had a cardiac hx.  [name] CAD Sister     No Known Allergies  Outpatient Medications Prior to Visit   Medication Sig Dispense Refill  . amLODipine (NORVASC) 10 MG tablet Take 10 mg by mouth daily.    [name] aspirin 81 MG tablet Take 81 mg by mouth daily.    . Calcium Carbonate-Vitamin D 600-400 MG-UNIT tablet Take 1 tablet by mouth daily.    . carvedilol (COREG) 12.5 MG tablet Take 12.5 mg by mouth 2 (two) times daily.    . cholecalciferol (VITAMIN D) 1000 UNITS tablet Take 1,000 Units by mouth daily.    . Continuous Blood Gluc Receiver (FREESTYLE LIBRE 2 READER) DEVI 3 Units by Does not apply route in the morning, at noon, and at bedtime. 1 each 4  . Continuous Blood Gluc Sensor (FREESTYLE LIBRE 2 SENSOR) MISC 3 Units by Does not apply route in the morning, at noon, and at bedtime. 1 each 4  . doxazosin (CARDURA) 2 MG tablet Take 2 mg by mouth at bedtime.    [name] ezetimibe (ZETIA) 10 MG tablet Take 1 tablet (10 mg total) by mouth daily. Needs appt for further refills 90 tablet 0  . HYDROcodone-acetaminophen (NORCO/VICODIN) 5-325 MG tablet Take 1 tablet by mouth every 4 (four) hours as needed. (Patient not taking: Reported on 02/25/2020) 10 tablet 0  . insulin aspart (NOVOLOG FLEXPEN) 100 UNIT/ML FlexPen Inject 8 Units into the skin 3 (three) times daily with meals. Per sliding scale 15 mL 1  . insulin glargine (LANTUS SOLOSTAR) 100 UNIT/ML Solostar Pen Inject 20 Units into the skin daily. 30 mL 6  . Insulin Pen Needle 32G X 4 MM MISC 1 application by Does not apply route 4 (four) times daily. 200 each 5  . isosorbide mononitrate (IMDUR) 30 MG 24 hr tablet TAKE 1 TABLET BY MOUTH EVERY DAY 90 tablet 3  . magnesium oxide (MAG-OX) 400 (241.3 Mg) MG tablet Take 400 mg by mouth 2 (two) times daily.     . nitroGLYCERIN (NITROSTAT) 0.4 MG SL tablet Place 1 tablet (0.4 mg total) under the tongue every 5 (five) minutes as needed for chest pain. Reported on 04/12/2015 60 tablet 1  . nystatin (MYCOSTATIN) 100000 UNIT/ML suspension Take 5 mLs by mouth See admin instructions. Swish and swallow 5 ml's by mouth four times  a day    . omega-3 acid ethyl esters (LOVAZA) 1 G capsule Take 1 g by mouth daily.     . ondansetron (ZOFRAN) 4 MG tablet Take 1 tablet (4 mg total) every 8 (eight) hours as needed by mouth for nausea or vomiting. Reported on 05/24/2015 30 tablet 1  . PRALUENT 75 MG/ML SOAJ INJECT 75 MG INTO THE SKIN EVERY 14 (FOURTEEN) DAYS. 6 pen 1  . predniSONE (DELTASONE) 5 MG tablet Take 1 tablet by mouth in the morning.     . Semaglutide, 1 MG/DOSE, (OZEMPIC, 1 MG/DOSE,) 2 MG/1.5ML SOPN Inject  1 mg into the skin once a week. 3 mL 2   No facility-administered medications prior to visit.     ROS Review of Systems  Constitutional: Negative for activity change and appetite change.  HENT: Negative for sinus pressure and sore throat.   Eyes: Negative for visual disturbance.  Respiratory: Negative for cough, chest tightness and shortness of breath.   Cardiovascular: Negative for chest pain and leg swelling.  Gastrointestinal: Negative for abdominal distention, abdominal pain, constipation and diarrhea.  Endocrine: Negative.   Genitourinary: Negative for dysuria.  Musculoskeletal: Negative for joint swelling and myalgias.  Skin: Negative for rash.  Allergic/Immunologic: Negative.   Neurological: Negative for weakness, light-headedness and numbness.  Psychiatric/Behavioral: Negative for dysphoric mood and suicidal ideas.    Objective:  BP 121/80   Pulse 91   Ht '5\' 6"'$  (1.676 m)   Wt 142 lb (64.4 kg)   SpO2 100%   BMI 22.92 kg/m   BP/Weight 03/25/2020 [id number]  Systolic BP 123XX123 [id number]  Diastolic BP 80 80 83  Wt. (Lbs) 142 - 142  BMI 22.92 - 22.92      Physical Exam Constitutional:      Appearance: He is well-developed.  Neck:     Vascular: No JVD.  Cardiovascular:     Rate and Rhythm: Normal rate.     Heart sounds: Normal heart sounds. No murmur heard.   Pulmonary:     Effort: Pulmonary effort is normal.     Breath sounds: Normal breath sounds. No wheezing or rales.   Chest:     Chest wall: No tenderness.  Abdominal:     General: Bowel sounds are normal. There is no distension.     Palpations: Abdomen is soft. There is no mass.     Tenderness: There is no abdominal tenderness.  Musculoskeletal:        General: Normal range of motion.     Right lower leg: No edema.     Left lower leg: No edema.  Neurological:     Mental Status: He is alert and oriented to person, place, and time.  Psychiatric:        Mood and Affect: Mood normal.     CMP Latest Ref Rng & Units 02/25/2020 08/11/2019 07/31/2019  Glucose 65 - 99 mg/dL 295(H) 326(H) 209(H)  BUN 6 - 24 mg/dL 14 15 22(H)  Creatinine 0.76 - 1.27 mg/dL 1.26 1.48(H) 1.24  Sodium 134 - 144 mmol/L 137 138 137  Potassium 3.5 - 5.2 mmol/L 4.4 4.5 4.2  Chloride 96 - 106 mmol/L 96 100 102  CO2 20 - 29 mmol/L 22 19(L) 23  Calcium 8.7 - 10.2 mg/dL 9.9 10.1 8.8(L)  Total Protein 6.0 - 8.5 g/dL 7.1 7.2 -  Total Bilirubin 0.0 - 1.2 mg/dL 0.4 0.5 -  Alkaline Phos 44 - 121 IU/L 107 111 -  AST 0 - 40 IU/L 15 23 -  ALT 0 - 44 IU/L 15 14 -    Lipid Panel     Component Value Date/Time   CHOL 88 (L) 08/21/2016 1037   TRIG 92 08/21/2016 1037   HDL 47 08/21/2016 1037   CHOLHDL 1.9 08/21/2016 1037   CHOLHDL 2.3 06/22/2015 1609   VLDL 42 (H) 06/22/2015 1609   LDLCALC 23 08/21/2016 1037    CBC    Component Value Date/Time   WBC 7.8 07/29/2019 1450   RBC 5.10 07/29/2019 1450   HGB 17.0 07/29/2019 1643   HGB 15.0 05/08/2018 0800  HCT 50.0 07/29/2019 1643   HCT 43.2 05/08/2018 0800   PLT 242 07/29/2019 1450   PLT 203 05/08/2018 0800   MCV 92.2 07/29/2019 1450   MCV 98 (H) 05/08/2018 0800   MCH 32.0 07/29/2019 1450   MCHC 34.7 07/29/2019 1450   RDW 12.2 07/29/2019 1450   RDW 12.3 05/08/2018 0800   LYMPHSABS 0.6 (L) 07/29/2019 1450   LYMPHSABS 1.3 05/08/2018 0800   MONOABS 0.4 07/29/2019 1450   EOSABS 0.0 07/29/2019 1450   EOSABS 0.1 05/08/2018 0800   BASOSABS 0.0 07/29/2019 1450   BASOSABS 0.0  05/08/2018 0800    Lab Results  Component Value Date   HGBA1C 14.0 (A) 02/25/2020    Assessment & Plan:   1. Type 2 diabetes mellitus with hyperglycemia, with long-term current use of insulin (HCC) Uncontrolled with A1c of 14.0; goal is less than 7.0 Noncompliance largely responsible Severe hyperglycemia 545 at risk of progression to DKA. Urinalysis negative for ketones NovoLog 30 units administered statin patient observed for 45 minutes after which blood sugar repeated Repeat CBG 441 He has been advised to skip his morning NovoLog dose but to administer NovoLog with his next meal. Encouraged to increase Lantus to 20 units as agreed upon at his last visit He will see the clinical pharmacist in 1 week for reevaluation - POCT glucose (manual entry) - insulin aspart (novoLOG) injection 30 Units  2. Hypertension associated with diabetes ([place]) Controlled Counseled on blood pressure goal of less than 130/80, low-sodium, DASH diet, medication compliance, 150 minutes of moderate intensity exercise per week. Discussed medication compliance, adverse effects.  3. Dry mouth He is yet to pick up prescription for Biotene Hyperglycemia could also be contributing.  Meds ordered this encounter  Medications  . insulin aspart (novoLOG) injection 30 Units    Follow-up: Return in about 1 week (around 04/01/2020) for Blood sugar log evaluation with Lurena Joiner; keep previously scheduled appointment with PCP.       Charlott Rakes, MD, FAAFP. [facility] and [place], [place]   03/25/2020, 12:41 PM

## 2020-04-01 DIAGNOSIS — B349 Viral infection, unspecified: Secondary | ICD-10-CM | POA: Diagnosis not present

## 2020-04-01 DIAGNOSIS — Z5181 Encounter for therapeutic drug level monitoring: Secondary | ICD-10-CM | POA: Diagnosis not present

## 2020-04-01 DIAGNOSIS — Z941 Heart transplant status: Secondary | ICD-10-CM | POA: Diagnosis not present

## 2020-04-01 DIAGNOSIS — B259 Cytomegaloviral disease, unspecified: Secondary | ICD-10-CM | POA: Diagnosis not present

## 2020-04-01 DIAGNOSIS — Z940 Kidney transplant status: Secondary | ICD-10-CM | POA: Diagnosis not present

## 2020-04-05 DIAGNOSIS — Z941 Heart transplant status: Secondary | ICD-10-CM | POA: Diagnosis not present

## 2020-04-05 DIAGNOSIS — E1165 Type 2 diabetes mellitus with hyperglycemia: Secondary | ICD-10-CM | POA: Diagnosis not present

## 2020-04-05 DIAGNOSIS — E139 Other specified diabetes mellitus without complications: Secondary | ICD-10-CM | POA: Diagnosis not present

## 2020-04-05 DIAGNOSIS — I10 Essential (primary) hypertension: Secondary | ICD-10-CM | POA: Diagnosis not present

## 2020-04-05 DIAGNOSIS — Z794 Long term (current) use of insulin: Secondary | ICD-10-CM | POA: Diagnosis not present

## 2020-04-05 DIAGNOSIS — Z940 Kidney transplant status: Secondary | ICD-10-CM | POA: Diagnosis not present

## 2020-04-05 DIAGNOSIS — Z7952 Long term (current) use of systemic steroids: Secondary | ICD-10-CM | POA: Diagnosis not present

## 2020-04-05 DIAGNOSIS — E785 Hyperlipidemia, unspecified: Secondary | ICD-10-CM | POA: Diagnosis not present

## 2020-04-08 ENCOUNTER — Ambulatory Visit: Payer: Medicare Other | Admitting: Pharmacist

## 2020-04-22 DIAGNOSIS — Z941 Heart transplant status: Secondary | ICD-10-CM | POA: Diagnosis not present

## 2020-04-22 DIAGNOSIS — Z5181 Encounter for therapeutic drug level monitoring: Secondary | ICD-10-CM | POA: Diagnosis not present

## 2020-04-22 DIAGNOSIS — B349 Viral infection, unspecified: Secondary | ICD-10-CM | POA: Diagnosis not present

## 2020-04-22 DIAGNOSIS — B259 Cytomegaloviral disease, unspecified: Secondary | ICD-10-CM | POA: Diagnosis not present

## 2020-04-22 DIAGNOSIS — Z940 Kidney transplant status: Secondary | ICD-10-CM | POA: Diagnosis not present

## 2020-04-23 ENCOUNTER — Ambulatory Visit: Payer: Medicare Other | Admitting: Pharmacist

## 2020-04-27 ENCOUNTER — Encounter (HOSPITAL_COMMUNITY): Payer: Self-pay

## 2020-04-27 ENCOUNTER — Ambulatory Visit: Payer: Medicare Other | Attending: Family Medicine | Admitting: Pharmacist

## 2020-04-27 ENCOUNTER — Other Ambulatory Visit: Payer: Self-pay

## 2020-04-27 ENCOUNTER — Emergency Department (HOSPITAL_COMMUNITY)
Admission: EM | Admit: 2020-04-27 | Discharge: 2020-04-27 | Disposition: A | Discharge status: Home / Self Care | Payer: Medicare Other | Attending: Emergency Medicine | Admitting: Emergency Medicine

## 2020-04-27 DIAGNOSIS — E1165 Type 2 diabetes mellitus with hyperglycemia: Secondary | ICD-10-CM | POA: Diagnosis not present

## 2020-04-27 DIAGNOSIS — N183 Chronic kidney disease, stage 3 unspecified: Secondary | ICD-10-CM | POA: Diagnosis not present

## 2020-04-27 DIAGNOSIS — I2510 Atherosclerotic heart disease of native coronary artery without angina pectoris: Secondary | ICD-10-CM | POA: Insufficient documentation

## 2020-04-27 DIAGNOSIS — I130 Hypertensive heart and chronic kidney disease with heart failure and stage 1 through stage 4 chronic kidney disease, or unspecified chronic kidney disease: Secondary | ICD-10-CM | POA: Diagnosis not present

## 2020-04-27 DIAGNOSIS — Z79899 Other long term (current) drug therapy: Secondary | ICD-10-CM | POA: Insufficient documentation

## 2020-04-27 DIAGNOSIS — Z794 Long term (current) use of insulin: Secondary | ICD-10-CM | POA: Diagnosis not present

## 2020-04-27 DIAGNOSIS — R739 Hyperglycemia, unspecified: Secondary | ICD-10-CM

## 2020-04-27 DIAGNOSIS — Z955 Presence of coronary angioplasty implant and graft: Secondary | ICD-10-CM | POA: Diagnosis not present

## 2020-04-27 DIAGNOSIS — I5022 Chronic systolic (congestive) heart failure: Secondary | ICD-10-CM | POA: Diagnosis not present

## 2020-04-27 DIAGNOSIS — Z7982 Long term (current) use of aspirin: Secondary | ICD-10-CM | POA: Insufficient documentation

## 2020-04-27 LAB — CBC
HCT: 42.5 % (ref 39.0–52.0)
Hemoglobin: 14.3 g/dL (ref 13.0–17.0)
MCH: 31.7 pg (ref 26.0–34.0)
MCHC: 33.6 g/dL (ref 30.0–36.0)
MCV: 94.2 fL (ref 80.0–100.0)
Platelets: 261 10*3/uL (ref 150–400)
RBC: 4.51 MIL/uL (ref 4.22–5.81)
RDW: 12.3 % (ref 11.5–15.5)
WBC: 7.9 10*3/uL (ref 4.0–10.5)
nRBC: 0 % (ref 0.0–0.2)

## 2020-04-27 LAB — GLUCOSE, POCT (MANUAL RESULT ENTRY): POC Glucose: >600 mg/dl (ref 70–99)

## 2020-04-27 LAB — COMPREHENSIVE METABOLIC PANEL
ALT: 16 U/L (ref 0–44)
AST: 15 U/L (ref 15–41)
Albumin: 3.9 g/dL (ref 3.5–5.0)
Alkaline Phosphatase: 100 U/L (ref 38–126)
Anion gap: 11 (ref 5–15)
BUN: 23 mg/dL — ABNORMAL HIGH (ref 6–20)
CO2: 25 mmol/L (ref 22–32)
Calcium: 9.8 mg/dL (ref 8.9–10.3)
Chloride: 93 mmol/L — ABNORMAL LOW (ref 98–111)
Creatinine, Ser: 1.45 mg/dL — ABNORMAL HIGH (ref 0.61–1.24)
GFR, Estimated: 57 mL/min — ABNORMAL LOW (ref >60)
Glucose, Bld: 656 mg/dL (ref 70–99)
Potassium: 5 mmol/L (ref 3.5–5.1)
Sodium: 129 mmol/L — ABNORMAL LOW (ref 135–145)
Total Bilirubin: 0.8 mg/dL (ref 0.3–1.2)
Total Protein: 7.4 g/dL (ref 6.5–8.1)

## 2020-04-27 LAB — CBG MONITORING, ED
Glucose-Capillary: >600 mg/dL (ref 70–99)
Glucose-Capillary: 406 mg/dL — ABNORMAL HIGH (ref 70–99)
Glucose-Capillary: 230 mg/dL — ABNORMAL HIGH (ref 70–99)

## 2020-04-27 LAB — BETA-HYDROXYBUTYRIC ACID: Beta-Hydroxybutyric Acid: 0.27 mmol/L (ref 0.05–0.27)

## 2020-04-27 MED ORDER — SODIUM CHLORIDE 0.9 % IV BOLUS
1000.0000 mL | Freq: Once | INTRAVENOUS | Status: AC
  Administered 2020-04-27: 1000 mL via INTRAVENOUS

## 2020-04-27 MED ORDER — INSULIN ASPART 100 UNIT/ML ~~LOC~~ SOLN
8.0000 [IU] | Freq: Once | SUBCUTANEOUS | Status: AC
  Administered 2020-04-27: 8 [IU] via SUBCUTANEOUS

## 2020-04-27 MED ORDER — LACTATED RINGERS IV BOLUS
1000.0000 mL | Freq: Once | INTRAVENOUS | Status: AC
  Administered 2020-04-27: 1000 mL via INTRAVENOUS

## 2020-04-27 NOTE — Discharge Instructions (Addendum)
Remember to take your diabetic medications as prescribed. Drink plenty of fluids.

## 2020-04-27 NOTE — ED Notes (Signed)
Assuming care. Pt resting in bed @ this time w/ NAD noted. VSS. Cardiac monitoring in place w/ bp cycling and spo2 being monnitored. Updated on plan of care. Deny needs or concerns @ this time. Bed low and locked. Aware awaiting dispo.

## 2020-04-27 NOTE — ED Provider Notes (Signed)
[facility] EMERGENCY DEPARTMENT Provider Note   CSN: [id number] Arrival date & time: 04/27/20  1525     History Chief Complaint  Patient presents with  . Hyperglycemia    Marvin Martin is a 55 y.o. male.  The history is provided by the patient and medical records.  Hyperglycemia  Marvin Martin is a 55 y.o. male who presents to the Emergency Department complaining of hyperglycemia. He presents the emergency department upon referral from his primary care provider's office for evaluation of elevated blood sugar. He has a history of heart and kidney transplant in 2020 and since transplant has had diabetes. He is compliant with all of his medications. He did not take his insulin this morning due to going to the office. All he has had to eat today is some fruit. He reports one or more weeks of excessive thirst. He denies any fevers, chest pain, abdominal pain, difficulty breathing, nausea, vomiting, diarrhea. Symptoms are mild in nature. His PCP appointment today was a routine follow-up. Tomorrow he is scheduled to follow-up with [facility] regarding his transplant.    Past Medical History:  Diagnosis Date  . Chronic systolic CHF (congestive heart failure) ([place])    a. 01/2015 Echo: EF 20-25%, antlat, inflat AK.  [name] CKD (chronic kidney disease), stage III ([place])   . Coronary artery disease    a. s/p MI x 4;  b. Reported h/o 17 stents with recurrent ISR;  c. 02/2014 Cath ([place]): PCI to unknown vessel;  d. 01/2015 MV: EF 18%, large scar, no ischemia; e. 03/2015 PCI: LM nl, LAD nl, D2 patent stent, RI patent stent, LCX patent stents p/m, RCA 95p ISR (PTCA), 72m 100d into RPL CTO, RPDA 90 (2.5x16 Synergy DES).  . Hyperlipidemia   . Hypertensive heart disease   . Ischemic cardiomyopathy    a. s/p SJM ICD 2007 w/ gen change in 2012; b. 12/2014 CPX Test: mild to mod HF limitation w/ pVO2 20.3 and nl slope;  c. 01/2015 Echo: EF 20-25%.    Patient Active Problem List   Diagnosis Date  Noted  . DM (diabetes mellitus), secondary uncontrolled ([place] 07/29/2019  . Hyponatremia 07/29/2019  . Hypotension 07/29/2019  . DKA (diabetic ketoacidoses) 07/29/2019  . Drug-induced hyperglycemia 04/05/2019  . Renal transplant, status post 04/05/2019  . H/O heart transplant ([facility] [place] [place] 04/05/2019  . Gout 01/07/2018  . Unstable angina ([place] 06/22/2015  . Recurrent angina status post coronary stent placement ([place]   . Acute kidney injury ([place]   . Hypertensive heart disease 04/03/2015  . Hyperlipidemia 04/03/2015  . ST elevation (STEMI) myocardial infarction involving right coronary artery ([facility] 03/31/2015  . Ischemic chest pain ([place]   . Cardiomyopathy, ischemic   . CKD (chronic kidney disease) stage 3, GFR 30-59 ml/min (HCC) 02/09/2015  . Chronic systolic heart failure ([facility] [facility] 01/07/2015  . CAD in native artery 01/07/2015  . Benign essential HTN 01/07/2015  . CHF (congestive heart failure) ([facility] 12/11/2014    Past Surgical History:  Procedure Laterality Date  . 17 stints placements Bilateral   . CARDIAC CATHETERIZATION     Most recent 02/2014   . CARDIAC CATHETERIZATION N/A 03/31/2015   Procedure: Left Heart Cath and Coronary Angiography;  Surgeon: TTroy Sine MD;  [facility];  Service: Cardiovascular;  Laterality: N/A;  . CARDIAC CATHETERIZATION N/A 03/31/2015   Procedure: Coronary Stent Intervention;  Surgeon: TTroy Sine MD;  [facility];  Service: Cardiovascular;  Laterality: N/A;  .  CARDIAC CATHETERIZATION N/A 05/27/2015   Procedure: Right/Left Heart Cath and Coronary Angiography;  Surgeon: Jolaine Artist, MD;  Location: [facility] LAB;  Service: Cardiovascular;  Laterality: N/A;  . CARDIAC CATHETERIZATION N/A 06/23/2015   Procedure: Right/Left Heart Cath and Coronary Angiography;  Surgeon: Jolaine Artist, MD;  Location: [facility] CV LAB;  Service: Cardiovascular;  Laterality: N/A;  . HEART TRANSPLANT    . INSERT / REPLACE /  REMOVE PACEMAKER     St jude 2006  Generator Change 2012   . NEPHRECTOMY TRANSPLANTED ORGAN    . PACEMAKER GENERATOR CHANGE Bilateral        Family History  Problem Relation Age of Onset  . Cancer Mother        died when pt was 89.  She had a h/o heart dzs as well.  . Hypertension Mother   . Hyperlipidemia Father        Father died when pt was age 20 - he believes that he had a cardiac hx.  [name] CAD Sister     Social History   Tobacco Use  . Smoking status: Never Smoker  . Smokeless tobacco: Never Used  Vaping Use  . Vaping Use: Never used  Substance Use Topics  . Alcohol use: No    Alcohol/week: 0.0 standard drinks  . Drug use: No    Home Medications Prior to Admission medications   Medication Sig Start Date End Date Taking? Authorizing Provider  amLODipine (NORVASC) 10 MG tablet Take 10 mg by mouth daily. 08/15/19 08/14/20  [provider]  aspirin 81 MG tablet Take 81 mg by mouth daily.    [provider]  Calcium Carbonate-Vitamin D 600-400 MG-UNIT tablet Take 1 tablet by mouth daily. 01/06/19 01/06/20  [provider]  carvedilol (COREG) 12.5 MG tablet Take 12.5 mg by mouth 2 (two) times daily. 03/23/19   [provider]  cholecalciferol (VITAMIN D) 1000 UNITS tablet Take 1,000 Units by mouth daily.    [provider]  Continuous Blood Gluc Receiver (FREESTYLE LIBRE 2 READER) DEVI 3 Units by Does not apply route in the morning, at noon, and at bedtime. 07/31/19   Georgette Shell, MD  Continuous Blood Gluc Sensor (FREESTYLE LIBRE 2 SENSOR) MISC 3 Units by Does not apply route in the morning, at noon, and at bedtime. 07/31/19   Georgette Shell, MD  doxazosin (CARDURA) 2 MG tablet Take 2 mg by mouth at bedtime. 03/11/19   [provider]  ezetimibe (ZETIA) 10 MG tablet Take 1 tablet (10 mg total) by mouth daily. Needs appt for further refills 05/27/19   Bensimhon, Shaune Pascal, MD  HYDROcodone-acetaminophen (NORCO/VICODIN)  5-325 MG tablet Take 1 tablet by mouth every 4 (four) hours as needed. Patient not taking: Reported on 02/25/2020 12/16/17   Recardo Evangelist, PA-C  insulin aspart (NOVOLOG FLEXPEN) 100 UNIT/ML FlexPen Inject 8 Units into the skin 3 (three) times daily with meals. Per sliding scale 03/11/20   Charlott Rakes, MD  insulin glargine (LANTUS SOLOSTAR) 100 UNIT/ML Solostar Pen Inject 20 Units into the skin daily. 03/11/20   Charlott Rakes, MD  Insulin Pen Needle 32G X 4 MM MISC 1 application by Does not apply route 4 (four) times daily. 04/07/19   Guilford Shi, MD  isosorbide mononitrate (IMDUR) 30 MG 24 hr tablet TAKE 1 TABLET BY MOUTH EVERY DAY 08/13/19   Bensimhon, Shaune Pascal, MD  magnesium oxide (MAG-OX) 400 (241.3 Mg) MG tablet Take 400 mg by  mouth 2 (two) times daily.  03/11/19   [provider]  nitroGLYCERIN (NITROSTAT) 0.4 MG SL tablet Place 1 tablet (0.4 mg total) under the tongue every 5 (five) minutes as needed for chest pain. Reported on 04/12/2015 06/03/15   Charlott Rakes, MD  nystatin (MYCOSTATIN) 100000 UNIT/ML suspension Take 5 mLs by mouth See admin instructions. Swish and swallow 5 ml's by mouth four times a day 02/21/19   [provider]  omega-3 acid ethyl esters (LOVAZA) 1 G capsule Take 1 g by mouth daily.     [provider]  ondansetron (ZOFRAN) 4 MG tablet Take 1 tablet (4 mg total) every 8 (eight) hours as needed by mouth for nausea or vomiting. Reported on 05/24/2015 11/20/16   Clegg, Amy D, NP  PRALUENT 75 MG/ML SOAJ INJECT 75 MG INTO THE SKIN EVERY 14 (FOURTEEN) DAYS. 05/01/19   Bensimhon, Shaune Pascal, MD  predniSONE (DELTASONE) 5 MG tablet Take 1 tablet by mouth in the morning.  06/18/19   [provider]  Semaglutide, 1 MG/DOSE, (OZEMPIC, 1 MG/DOSE,) 2 MG/1.5ML SOPN Inject 1 mg into the skin once a week. 03/11/20   Charlott Rakes, MD    Allergies    Patient has no known allergies.  Review of Systems   Review of Systems  All other systems reviewed  and are negative.   Physical Exam Updated Vital Signs BP (!) 140/98 (BP Location: Right Arm)   Pulse 88   Temp 98.2 F (36.8 C) (Oral)   Resp 20   Ht '5\' 7"'$  (1.702 m)   Wt 60.3 kg   SpO2 100%   BMI 20.83 kg/m   Physical Exam Vitals and nursing note reviewed.  Constitutional:      Appearance: He is well-developed.  HENT:     Head: Normocephalic and atraumatic.  Cardiovascular:     Rate and Rhythm: Normal rate and regular rhythm.     Heart sounds: No murmur heard.   Pulmonary:     Effort: Pulmonary effort is normal. No respiratory distress.     Breath sounds: Normal breath sounds.  Abdominal:     Palpations: Abdomen is soft.     Tenderness: There is no abdominal tenderness. There is no guarding or rebound.  Musculoskeletal:        General: No swelling or tenderness.  Skin:    General: Skin is warm and dry.  Neurological:     Mental Status: He is alert and oriented to person, place, and time.  Psychiatric:        Behavior: Behavior normal.     ED Results / Procedures / Treatments   Labs (all labs ordered are listed, but only abnormal results are displayed) Labs Reviewed  COMPREHENSIVE METABOLIC PANEL - Abnormal; Notable for the following components:      Result Value   Sodium 129 (*)    Chloride 93 (*)    Glucose, Bld 656 (*)    BUN 23 (*)    Creatinine, Ser 1.45 (*)    GFR, Estimated 57 (*)    All other components within normal limits  CBG MONITORING, ED - Abnormal; Notable for the following components:   Glucose-Capillary >600 (*)    All other components within normal limits  CBG MONITORING, ED - Abnormal; Notable for the following components:   Glucose-Capillary 406 (*)    All other components within normal limits  CBG MONITORING, ED - Abnormal; Notable for the following components:   Glucose-Capillary 230 (*)  All other components within normal limits  CBC  BETA-HYDROXYBUTYRIC ACID  URINALYSIS, ROUTINE W REFLEX MICROSCOPIC  I-STAT CHEM 8, ED   I-STAT VENOUS BLOOD GAS, ED    EKG None  Radiology No results found.  Procedures Procedures   Medications Ordered in ED Medications  sodium chloride 0.9 % bolus 1,000 mL (0 mLs Intravenous Stopped 04/27/20 1753)  lactated ringers bolus 1,000 mL (0 mLs Intravenous Stopped 04/27/20 1855)  insulin aspart (novoLOG) injection 8 Units (8 Units Subcutaneous Given 04/27/20 1752)    ED Course  I have reviewed the triage vital signs and the nursing notes.  Pertinent labs & imaging results that were available during my care of the patient were reviewed by me and considered in my medical decision making (see chart for details).    MDM Rules/Calculators/A&P                         patient with history of heart and renal transplant here for evaluation of hyperglycemia. Aside from thirst he has no additional symptoms. He has significant hyperglycemia, no significant electrolyte abnormalities. He was treated with IV fluids for dehydration as well as insulin for hyperglycemia. He did not take any of his home insulin today. Discussed proper use of his insulin. Discussed outpatient follow-up and return precautions.  No evidence of DKA.  Final Clinical Impression(s) / ED Diagnoses Final diagnoses:  Hyperglycemia    Rx / DC Orders ED Discharge Orders    None       Quintella Reichert, MD 04/27/20 2015

## 2020-04-27 NOTE — ED Triage Notes (Signed)
Pt coming from MD office, states his blood sugar read "high" there and was told to come to ED. Pt states his only symptom is dry mouth. Pt denies pain at this time. NAD in triage.

## 2020-04-27 NOTE — Progress Notes (Signed)
    S:    PCP: Dr. Margarita Rana   No chief complaint on file.  Marvin Covingtonis a 55 year old male with a history of type 2 diabetes mellitus (A1c 14.0), ischemic cardiomyopathy (status post heart transplant at [facility] in 12/2018 currently on immunosuppressive therapy), chronic kidney disease stage III (status post renal transplantin 06/2019) here for a Clinical Pharmacy visit. He is found to have severely elevated blood sugar. The reading on our POCT glucometer registered as "Hi". His PCP is not in clinic today. Blood sugar of >600 is at least 2 hours postprandial.  He denies presence of headache, abdominal pain, dizziness, nausea or vomiting.  Today, he endorses compliance with his DM regimen initially, however, he is unable to correctly identify doses or frequencies with his insulin or Ozempic suggesting noncompliance. Endorses polyuria and polydipsia.  Patient denies hypoglycemic events.  Patient reported dietary habits:  - Denies changes in his diet; tries to adhere to a diabetic diet   Patient-reported exercise habits:  - Denies changes in exercise   O:  POCT: "Hi"  Lab Results  Component Value Date   HGBA1C 14.0 (A) 02/25/2020   There were no vitals filed for this visit.  Lipid Panel     Component Value Date/Time   CHOL 88 (L) 08/21/2016 1037   TRIG 92 08/21/2016 1037   HDL 47 08/21/2016 1037   CHOLHDL 1.9 08/21/2016 1037   CHOLHDL 2.3 06/22/2015 1609   VLDL 42 (H) 06/22/2015 1609   LDLCALC 23 08/21/2016 1037   Home fasting blood sugars: does not have his meter. Admits to not checking at home.   Clinical Atherosclerotic Cardiovascular Disease (ASCVD): Yes  The ASCVD Risk score Mikey Bussing DC Jr., et al., 2013) failed to calculate for the following reasons:   The patient has a prior MI or stroke diagnosis    A/P: Diabetes longstanding currently uncontrolled. High risk DKA. His PCP is unavailable today. I called his Endocrinologist office at [facility] and spoke to triage about him.  They advised that I send the patient to our ED. Given his CBG in clinic today and provider non-availability, pt sent to the ED. Called charge nurse who is expecting the patient. Suspect non-compliance.    Benard Halsted, PharmD, [place], [place] [phone]

## 2020-04-27 NOTE — ED Notes (Signed)
Medications follow up appts reviewed w/ pt. Denies questions or concerns @ this time. Education on s/s of worsening and when to return. Evaluated for hyperglycemia. FSBS down to 230 have IV fluids and insulin.  Left w/ even and steady gait. NAD noted. PIV removed and VSS.

## 2020-04-28 DIAGNOSIS — R739 Hyperglycemia, unspecified: Secondary | ICD-10-CM | POA: Diagnosis not present

## 2020-04-28 DIAGNOSIS — E785 Hyperlipidemia, unspecified: Secondary | ICD-10-CM | POA: Diagnosis present

## 2020-04-28 DIAGNOSIS — Z7982 Long term (current) use of aspirin: Secondary | ICD-10-CM | POA: Diagnosis not present

## 2020-04-28 DIAGNOSIS — E1122 Type 2 diabetes mellitus with diabetic chronic kidney disease: Secondary | ICD-10-CM | POA: Diagnosis present

## 2020-04-28 DIAGNOSIS — Z9114 Patient's other noncompliance with medication regimen: Secondary | ICD-10-CM | POA: Diagnosis not present

## 2020-04-28 DIAGNOSIS — N189 Chronic kidney disease, unspecified: Secondary | ICD-10-CM | POA: Diagnosis present

## 2020-04-28 DIAGNOSIS — N400 Enlarged prostate without lower urinary tract symptoms: Secondary | ICD-10-CM | POA: Diagnosis present

## 2020-04-28 DIAGNOSIS — E1165 Type 2 diabetes mellitus with hyperglycemia: Secondary | ICD-10-CM | POA: Diagnosis present

## 2020-04-28 DIAGNOSIS — E139 Other specified diabetes mellitus without complications: Secondary | ICD-10-CM | POA: Diagnosis not present

## 2020-04-28 DIAGNOSIS — Z794 Long term (current) use of insulin: Secondary | ICD-10-CM | POA: Diagnosis not present

## 2020-04-28 DIAGNOSIS — I255 Ischemic cardiomyopathy: Secondary | ICD-10-CM | POA: Diagnosis present

## 2020-04-28 DIAGNOSIS — D84821 Immunodeficiency due to drugs: Secondary | ICD-10-CM | POA: Diagnosis present

## 2020-04-28 DIAGNOSIS — Z941 Heart transplant status: Secondary | ICD-10-CM | POA: Diagnosis not present

## 2020-04-28 DIAGNOSIS — T383X6A Underdosing of insulin and oral hypoglycemic [antidiabetic] drugs, initial encounter: Secondary | ICD-10-CM | POA: Diagnosis present

## 2020-04-28 DIAGNOSIS — Z79899 Other long term (current) drug therapy: Secondary | ICD-10-CM | POA: Diagnosis not present

## 2020-04-28 DIAGNOSIS — Z940 Kidney transplant status: Secondary | ICD-10-CM | POA: Diagnosis not present

## 2020-04-28 DIAGNOSIS — T380X5A Adverse effect of glucocorticoids and synthetic analogues, initial encounter: Secondary | ICD-10-CM | POA: Diagnosis not present

## 2020-04-28 DIAGNOSIS — I129 Hypertensive chronic kidney disease with stage 1 through stage 4 chronic kidney disease, or unspecified chronic kidney disease: Secondary | ICD-10-CM | POA: Diagnosis present

## 2020-04-28 DIAGNOSIS — Z20822 Contact with and (suspected) exposure to covid-19: Secondary | ICD-10-CM | POA: Diagnosis present

## 2020-04-29 DIAGNOSIS — Z794 Long term (current) use of insulin: Secondary | ICD-10-CM | POA: Diagnosis not present

## 2020-04-29 DIAGNOSIS — Z941 Heart transplant status: Secondary | ICD-10-CM | POA: Diagnosis not present

## 2020-04-29 DIAGNOSIS — T380X5A Adverse effect of glucocorticoids and synthetic analogues, initial encounter: Secondary | ICD-10-CM | POA: Diagnosis not present

## 2020-04-29 DIAGNOSIS — R739 Hyperglycemia, unspecified: Secondary | ICD-10-CM | POA: Diagnosis not present

## 2020-04-29 DIAGNOSIS — E1165 Type 2 diabetes mellitus with hyperglycemia: Secondary | ICD-10-CM | POA: Diagnosis not present

## 2020-04-29 LAB — I-STAT VENOUS BLOOD GAS, ED
Acid-Base Excess: 1 mmol/L (ref 0.0–2.0)
Bicarbonate: 26.6 mmol/L (ref 20.0–28.0)
Calcium, Ion: 1.18 mmol/L (ref 1.15–1.40)
HCT: 45 % (ref 39.0–52.0)
Hemoglobin: 15.3 g/dL (ref 13.0–17.0)
O2 Saturation: 94 %
Potassium: 4.7 mmol/L (ref 3.5–5.1)
Sodium: 129 mmol/L — ABNORMAL LOW (ref 135–145)
TCO2: 28 mmol/L (ref 22–32)
pCO2, Ven: 43 mmHg — ABNORMAL LOW (ref 44.0–60.0)
pH, Ven: 7.4 (ref 7.250–7.430)
pO2, Ven: 70 mmHg — ABNORMAL HIGH (ref 32.0–45.0)

## 2020-04-29 LAB — I-STAT CHEM 8, ED
BUN: 27 mg/dL — ABNORMAL HIGH (ref 6–20)
Calcium, Ion: 1.16 mmol/L (ref 1.15–1.40)
Chloride: 94 mmol/L — ABNORMAL LOW (ref 98–111)
Creatinine, Ser: 1.3 mg/dL — ABNORMAL HIGH (ref 0.61–1.24)
Glucose, Bld: 635 mg/dL (ref 70–99)
HCT: 45 % (ref 39.0–52.0)
Hemoglobin: 15.3 g/dL (ref 13.0–17.0)
Potassium: 4.7 mmol/L (ref 3.5–5.1)
Sodium: 130 mmol/L — ABNORMAL LOW (ref 135–145)
TCO2: 26 mmol/L (ref 22–32)

## 2020-05-17 DIAGNOSIS — Z7984 Long term (current) use of oral hypoglycemic drugs: Secondary | ICD-10-CM | POA: Diagnosis not present

## 2020-05-17 DIAGNOSIS — Z79899 Other long term (current) drug therapy: Secondary | ICD-10-CM | POA: Diagnosis not present

## 2020-05-17 DIAGNOSIS — E139 Other specified diabetes mellitus without complications: Secondary | ICD-10-CM | POA: Diagnosis not present

## 2020-05-17 DIAGNOSIS — Z940 Kidney transplant status: Secondary | ICD-10-CM | POA: Diagnosis not present

## 2020-05-17 DIAGNOSIS — Z941 Heart transplant status: Secondary | ICD-10-CM | POA: Diagnosis not present

## 2020-05-17 DIAGNOSIS — Z794 Long term (current) use of insulin: Secondary | ICD-10-CM | POA: Diagnosis not present

## 2020-05-17 DIAGNOSIS — I10 Essential (primary) hypertension: Secondary | ICD-10-CM | POA: Diagnosis not present

## 2020-05-24 ENCOUNTER — Ambulatory Visit: Payer: Medicare Other | Admitting: Family Medicine

## 2020-06-25 DIAGNOSIS — I10 Essential (primary) hypertension: Secondary | ICD-10-CM | POA: Diagnosis not present

## 2020-06-25 DIAGNOSIS — Z794 Long term (current) use of insulin: Secondary | ICD-10-CM | POA: Diagnosis not present

## 2020-06-25 DIAGNOSIS — R739 Hyperglycemia, unspecified: Secondary | ICD-10-CM | POA: Diagnosis not present

## 2020-06-25 DIAGNOSIS — T50905A Adverse effect of unspecified drugs, medicaments and biological substances, initial encounter: Secondary | ICD-10-CM | POA: Diagnosis not present

## 2020-06-25 DIAGNOSIS — I517 Cardiomegaly: Secondary | ICD-10-CM | POA: Diagnosis not present

## 2020-06-25 DIAGNOSIS — I371 Nonrheumatic pulmonary valve insufficiency: Secondary | ICD-10-CM | POA: Diagnosis not present

## 2020-06-25 DIAGNOSIS — Z48298 Encounter for aftercare following other organ transplant: Secondary | ICD-10-CM | POA: Diagnosis not present

## 2020-06-25 DIAGNOSIS — Z941 Heart transplant status: Secondary | ICD-10-CM | POA: Diagnosis not present

## 2020-06-25 DIAGNOSIS — E119 Type 2 diabetes mellitus without complications: Secondary | ICD-10-CM | POA: Diagnosis not present

## 2020-06-25 DIAGNOSIS — Z940 Kidney transplant status: Secondary | ICD-10-CM | POA: Diagnosis not present

## 2020-06-25 DIAGNOSIS — Z4822 Encounter for aftercare following kidney transplant: Secondary | ICD-10-CM | POA: Diagnosis not present

## 2020-06-25 DIAGNOSIS — D84821 Immunodeficiency due to drugs: Secondary | ICD-10-CM | POA: Diagnosis not present

## 2020-06-25 DIAGNOSIS — Z79899 Other long term (current) drug therapy: Secondary | ICD-10-CM | POA: Diagnosis not present

## 2020-08-11 DIAGNOSIS — E139 Other specified diabetes mellitus without complications: Secondary | ICD-10-CM | POA: Diagnosis not present

## 2020-09-07 ENCOUNTER — Emergency Department (HOSPITAL_COMMUNITY)
Admission: EM | Admit: 2020-09-07 | Discharge: 2020-09-07 | Disposition: A | Discharge status: Home / Self Care | Payer: Medicare Other | Attending: Emergency Medicine | Admitting: Emergency Medicine

## 2020-09-07 ENCOUNTER — Encounter (HOSPITAL_COMMUNITY): Payer: Self-pay | Admitting: Emergency Medicine

## 2020-09-07 ENCOUNTER — Emergency Department (HOSPITAL_COMMUNITY): Payer: Medicare Other

## 2020-09-07 DIAGNOSIS — N183 Chronic kidney disease, stage 3 unspecified: Secondary | ICD-10-CM | POA: Insufficient documentation

## 2020-09-07 DIAGNOSIS — I130 Hypertensive heart and chronic kidney disease with heart failure and stage 1 through stage 4 chronic kidney disease, or unspecified chronic kidney disease: Secondary | ICD-10-CM | POA: Insufficient documentation

## 2020-09-07 DIAGNOSIS — E1122 Type 2 diabetes mellitus with diabetic chronic kidney disease: Secondary | ICD-10-CM | POA: Insufficient documentation

## 2020-09-07 DIAGNOSIS — Z794 Long term (current) use of insulin: Secondary | ICD-10-CM | POA: Diagnosis not present

## 2020-09-07 DIAGNOSIS — Z79899 Other long term (current) drug therapy: Secondary | ICD-10-CM | POA: Diagnosis not present

## 2020-09-07 DIAGNOSIS — I2510 Atherosclerotic heart disease of native coronary artery without angina pectoris: Secondary | ICD-10-CM | POA: Diagnosis not present

## 2020-09-07 DIAGNOSIS — Z7982 Long term (current) use of aspirin: Secondary | ICD-10-CM | POA: Diagnosis not present

## 2020-09-07 DIAGNOSIS — M25562 Pain in left knee: Secondary | ICD-10-CM | POA: Insufficient documentation

## 2020-09-07 DIAGNOSIS — I5022 Chronic systolic (congestive) heart failure: Secondary | ICD-10-CM | POA: Insufficient documentation

## 2020-09-07 MED ORDER — OXYCODONE-ACETAMINOPHEN 5-325 MG PO TABS: 1.0000 | ORAL_TABLET | Freq: Four times a day (QID) | ORAL | 0 refills | Status: DC | PRN

## 2020-09-07 MED ORDER — OXYCODONE-ACETAMINOPHEN 5-325 MG PO TABS
1.0000 | ORAL_TABLET | Freq: Once | ORAL | Status: AC
  Administered 2020-09-07: 1 via ORAL
  Filled 2020-09-07: qty 1

## 2020-09-07 NOTE — Discharge Instructions (Addendum)
Please follow up with orthopedist Dr. Erlinda Hong for further evaluation of your knee pain  Use knee brace as indicated. While at home please rest, ice, and elevate your knee to help reduce pain/inflammation  Take the pain medication as needed for severe pain  Return to the ED for any new/worsening symptoms

## 2020-09-07 NOTE — ED Provider Notes (Signed)
[facility] EMERGENCY DEPARTMENT Provider Note   CSN: [id number] Arrival date & time: 09/07/20  [id number]     History No chief complaint on file.   Marvin Martin is a 55 y.o. male with complaint of gradual onset, constant, throbbing, L knee pain for the past 2 days. No trauma. Reports throbbing is worse at nighttime. Hx of gout however states this feels different as he is not tender to the touch like he usually is with gout. No swelling. No fevers or chills. Hx heart and renal transplant 2020. Denies pain to calf or popliteal area.   The history is provided by the patient and medical records.      Past Medical History:  Diagnosis Date   Chronic systolic CHF (congestive heart failure) ([place])    a. 01/2015 Echo: EF 20-25%, antlat, inflat AK.   CKD (chronic kidney disease), stage III (HCC)    Coronary artery disease    a. s/p MI x 4;  b. Reported h/o 17 stents with recurrent ISR;  c. 02/2014 Cath ([place]): PCI to unknown vessel;  d. 01/2015 MV: EF 18%, large scar, no ischemia; e. 03/2015 PCI: LM nl, LAD nl, D2 patent stent, RI patent stent, LCX patent stents p/m, RCA 95p ISR (PTCA), 26m 100d into RPL CTO, RPDA 90 (2.5x16 Synergy DES).   Hyperlipidemia    Hypertensive heart disease    Ischemic cardiomyopathy    a. s/p SJM ICD 2007 w/ gen change in 2012; b. 12/2014 CPX Test: mild to mod HF limitation w/ pVO2 20.3 and nl slope;  c. 01/2015 Echo: EF 20-25%.    Patient Active Problem List   Diagnosis Date Noted   DM (diabetes mellitus), secondary uncontrolled ([facility] 07/29/2019   Hyponatremia 07/29/2019   Hypotension 07/29/2019   DKA (diabetic ketoacidoses) 07/29/2019   Drug-induced hyperglycemia 04/05/2019   Renal transplant, status post 04/05/2019   H/O heart transplant ([facility] 04/05/2019   Gout 01/07/2018   Unstable angina ([facility] 06/22/2015   Recurrent angina status post coronary stent placement ([facility]    Acute kidney injury ([facility]    Hypertensive heart disease 04/03/2015    Hyperlipidemia 04/03/2015   ST elevation (STEMI) myocardial infarction involving right coronary artery ([facility] 03/31/2015   Ischemic chest pain (HCC)    Cardiomyopathy, ischemic    CKD (chronic kidney disease) stage 3, GFR 30-59 ml/min (HCC) [id number]  Chronic systolic heart failure ([facility] 01/07/2015   CAD in native artery 01/07/2015   Benign essential HTN 01/07/2015   CHF (congestive heart failure) ([facility] 12/11/2014    Past Surgical History:  Procedure Laterality Date   17 stints placements Bilateral    CARDIAC CATHETERIZATION     Most recent 02/2014    CARDIAC CATHETERIZATION N/A 03/31/2015   Procedure: Left Heart Cath and Coronary Angiography;  Surgeon: TTroy Sine MD;  [facility];  Service: Cardiovascular;  Laterality: N/A;   CARDIAC CATHETERIZATION N/A 03/31/2015   Procedure: Coronary Stent Intervention;  Surgeon: TTroy Sine MD;  [facility];  Service: Cardiovascular;  Laterality: N/A;   CARDIAC CATHETERIZATION N/A 05/27/2015   Procedure: Right/Left Heart Cath and Coronary Angiography;  Surgeon: DJolaine Artist MD;  [place] [facility];  Service: Cardiovascular;  Laterality: N/A;   CARDIAC CATHETERIZATION N/A 06/23/2015   Procedure: Right/Left Heart Cath and Coronary Angiography;  Surgeon: DJolaine Artist MD;  [facility];  Service: Cardiovascular;  Laterality: N/A;   HEART TRANSPLANT  INSERT / REPLACE / REMOVE PACEMAKER     St jude 2006  Generator Change 2012    NEPHRECTOMY TRANSPLANTED ORGAN     PACEMAKER GENERATOR CHANGE Bilateral        Family History  Problem Relation Age of Onset   Cancer Mother        died when pt was 68.  She had a h/o heart dzs as well.   Hypertension Mother    Hyperlipidemia Father        Father died when pt was age 89 - he believes that he had a cardiac hx.   CAD Sister     Social History   Tobacco Use   Smoking status: Never   Smokeless tobacco: Never  Vaping Use    Vaping Use: Never used  Substance Use Topics   Alcohol use: No    Alcohol/week: 0.0 standard drinks   Drug use: No    Home Medications Prior to Admission medications   Medication Sig Start Date End Date Taking? Authorizing Provider  oxyCODONE-acetaminophen (PERCOCET/ROXICET) 5-325 MG tablet Take 1 tablet by mouth every 6 (six) hours as needed for severe pain. 09/07/20  Yes Deserae Jennings, PA-C  aspirin 81 MG tablet Take 81 mg by mouth daily.    [provider]  Calcium Carbonate-Vitamin D 600-400 MG-UNIT tablet Take 1 tablet by mouth daily. 01/06/19 01/06/20  [provider]  carvedilol (COREG) 12.5 MG tablet Take 12.5 mg by mouth 2 (two) times daily. 03/23/19   [provider]  cholecalciferol (VITAMIN D) 1000 UNITS tablet Take 1,000 Units by mouth daily.    [provider]  Continuous Blood Gluc Receiver (FREESTYLE LIBRE 2 READER) DEVI 3 Units by Does not apply route in the morning, at noon, and at bedtime. 07/31/19   Georgette Shell, MD  Continuous Blood Gluc Sensor (FREESTYLE LIBRE 2 SENSOR) MISC 3 Units by Does not apply route in the morning, at noon, and at bedtime. 07/31/19   Georgette Shell, MD  doxazosin (CARDURA) 2 MG tablet Take 2 mg by mouth at bedtime. 03/11/19   [provider]  ezetimibe (ZETIA) 10 MG tablet Take 1 tablet (10 mg total) by mouth daily. Needs appt for further refills 05/27/19   Bensimhon, Shaune Pascal, MD  HYDROcodone-acetaminophen (NORCO/VICODIN) 5-325 MG tablet Take 1 tablet by mouth every 4 (four) hours as needed. Patient not taking: Reported on 02/25/2020 12/16/17   Recardo Evangelist, PA-C  insulin aspart (NOVOLOG FLEXPEN) 100 UNIT/ML FlexPen Inject 8 Units into the skin 3 (three) times daily with meals. Per sliding scale 03/11/20   Charlott Rakes, MD  insulin glargine (LANTUS SOLOSTAR) 100 UNIT/ML Solostar Pen Inject 20 Units into the skin daily. 03/11/20   Charlott Rakes, MD  Insulin Pen Needle 32G X 4 MM MISC 1  application by Does not apply route 4 (four) times daily. 04/07/19   Guilford Shi, MD  isosorbide mononitrate (IMDUR) 30 MG 24 hr tablet TAKE 1 TABLET BY MOUTH EVERY DAY 08/13/19   Bensimhon, Shaune Pascal, MD  magnesium oxide (MAG-OX) 400 (241.3 Mg) MG tablet Take 400 mg by mouth 2 (two) times daily.  03/11/19   [provider]  nitroGLYCERIN (NITROSTAT) 0.4 MG SL tablet Place 1 tablet (0.4 mg total) under the tongue every 5 (five) minutes as needed for chest pain. Reported on 04/12/2015 06/03/15   Charlott Rakes, MD  nystatin (MYCOSTATIN) 100000 UNIT/ML suspension Take 5 mLs by mouth See admin instructions. Swish and swallow 5 ml's by  mouth four times a day 02/21/19   [provider]  omega-3 acid ethyl esters (LOVAZA) 1 G capsule Take 1 g by mouth daily.     [provider]  ondansetron (ZOFRAN) 4 MG tablet Take 1 tablet (4 mg total) every 8 (eight) hours as needed by mouth for nausea or vomiting. Reported on 05/24/2015 11/20/16   Clegg, Amy D, NP  PRALUENT 75 MG/ML SOAJ INJECT 75 MG INTO THE SKIN EVERY 14 (FOURTEEN) DAYS. 05/01/19   Bensimhon, Shaune Pascal, MD  predniSONE (DELTASONE) 5 MG tablet Take 1 tablet by mouth in the morning.  06/18/19   [provider]  Semaglutide, 1 MG/DOSE, (OZEMPIC, 1 MG/DOSE,) 2 MG/1.5ML SOPN Inject 1 mg into the skin once a week. 03/11/20   Charlott Rakes, MD    Allergies    Patient has no known allergies.  Review of Systems   Review of Systems  Constitutional:  Negative for chills and fever.  Cardiovascular:  Negative for leg swelling.  Musculoskeletal:  Positive for arthralgias. Negative for joint swelling.  Skin:  Negative for color change.  All other systems reviewed and are negative.  Physical Exam Updated Vital Signs BP (!) 125/93   Pulse (!) 107   Temp 98.7 F (37.1 C) (Oral)   Resp 16   SpO2 100%   Physical Exam Vitals and nursing note reviewed.  Constitutional:      Appearance: He is not ill-appearing.  HENT:      Head: Normocephalic and atraumatic.  Eyes:     Conjunctiva/sclera: Conjunctivae normal.  Cardiovascular:     Rate and Rhythm: Normal rate and regular rhythm.  Pulmonary:     Effort: Pulmonary effort is normal.     Breath sounds: Normal breath sounds.  Musculoskeletal:     Comments: No obvious swelling to L knee compared to R. No erythema or increased warmth to the touch. Mild TTP to anterior aspect of knee. No TTP to popliteal area. ROM intact to knee with mild crepitus. Strength and sensation intact. 2+ PT pulses.   Skin:    General: Skin is warm and dry.     Coloration: Skin is not jaundiced.  Neurological:     Mental Status: He is alert.    ED Results / Procedures / Treatments   Labs (all labs ordered are listed, but only abnormal results are displayed) Labs Reviewed - No data to display  EKG None  Radiology DG Knee Complete 4 Views Left  Result Date: 09/07/2020 CLINICAL DATA:  Knee pain EXAM: LEFT KNEE - COMPLETE 4+ VIEW COMPARISON:  None. FINDINGS: No evidence of fracture, dislocation, or joint effusion. No evidence of arthropathy or other focal bone abnormality. Soft tissues are unremarkable. IMPRESSION: Negative. Electronically Signed   By: Yetta Glassman M.D.   On: 09/07/2020 10:01    Procedures Procedures   Medications Ordered in ED Medications  oxyCODONE-acetaminophen (PERCOCET/ROXICET) 5-325 MG per tablet 1 tablet (1 tablet Oral Given 09/07/20 1429)    ED Course  I have reviewed the triage vital signs and the nursing notes.  Pertinent labs & imaging results that were available during my care of the patient were reviewed by me and considered in my medical decision making (see chart for details).    MDM Rules/Calculators/A&P                           55 year old male who presents to the ED today with complaint of atraumatic left knee  pain for the past 2 days.  History of gout however states he states this does not feel similar whatsoever.  On arrival to the  ED and is initially tachycardic at 109, suspect s/2 pain. Remainder of vitals unremarkable.  He was medically screened by myself in triage and an x-ray was obtained which does not show any acute findings.  His exam does not seem consistent with gout as he has no erythema or overlying increased warmth.  Exam is not consistent with septic arthritis.  He has no calf tenderness palpation or popliteal tenderness palpation to suggest DVT at this time.  Will provide dose of Percocet with reevaluation.  He does have some crepitus on exam, question some arthritis causing pain.  Will need to follow-up with Ortho.  Pt able to ambulate after percocet; reports he was unable to do so prior to pain medication s/2 pain. Will plan to discharge with same at this time as well as knee brace and ortho follow up. RICE therapy discussed with pt. He is in agreement with plan and stable for discharge home.   This note was prepared using Dragon voice recognition software and may include unintentional dictation errors due to the inherent limitations of voice recognition software.   Final Clinical Impression(s) / ED Diagnoses Final diagnoses:  Acute pain of left knee    Rx / DC Orders ED Discharge Orders          Ordered    oxyCODONE-acetaminophen (PERCOCET/ROXICET) 5-325 MG tablet  Every 6 hours PRN        09/07/20 1505             Discharge Instructions      Please follow up with orthopedist Dr. Erlinda Hong for further evaluation of your knee pain  Use knee brace as indicated. While at home please rest, ice, and elevate your knee to help reduce pain/inflammation  Take the pain medication as needed for severe pain  Return to the ED for any new/worsening symptoms       Eustaquio Maize, PA-C 09/07/20 1506    Malvin Johns, MD 09/18/20 [phone]

## 2020-09-07 NOTE — ED Triage Notes (Signed)
Pt here with c/o left knee pain , no swelling or redness , no trauma noted

## 2020-09-07 NOTE — ED Provider Notes (Signed)
Emergency Medicine Provider Triage Evaluation Note  Marvin Martin , a 55 y.o. male  was evaluated in triage.  Pt complains of gradual onset, constant, throbbing, L knee pain for the past 2 days. No trauma. Reports throbbing is worse at nighttime. Hx of gout however states this feels different as he is not tender to the touch like he usually is with gout. No swelling. No fevers or chills. Hx heart and renal transplant 2020.  Review of Systems  Positive: + L knee pain Negative: - swelling, redness  Physical Exam  BP (!) 139/104 (BP Location: Right Arm)   Pulse (!) 109   Temp 98.7 F (37.1 C) (Oral)   Resp 14   SpO2 98%  Gen:   Awake, no distress   Resp:  Normal effort  MSK:   Moves extremities without difficulty  Other:  No swelling or erythema to knee joint. Mild TTP. ROM intact with crepitus. Strength and sensation intact. 2+ DP pulse.   Medical Decision Making  Medically screening exam initiated at 9:11 AM.  Appropriate orders placed.  Marvin Martin was informed that the remainder of the evaluation will be completed by another provider, this initial triage assessment does not replace that evaluation, and the importance of remaining in the ED until their evaluation is complete.     Eustaquio Maize, PA-C 09/07/20 [id number]    Malvin Johns, MD 09/07/20 1058

## 2020-09-10 ENCOUNTER — Encounter: Payer: Self-pay | Admitting: Orthopaedic Surgery

## 2020-09-10 ENCOUNTER — Ambulatory Visit (INDEPENDENT_AMBULATORY_CARE_PROVIDER_SITE_OTHER): Payer: Medicare Other | Admitting: Orthopaedic Surgery

## 2020-09-10 ENCOUNTER — Other Ambulatory Visit: Payer: Self-pay

## 2020-09-10 DIAGNOSIS — G8929 Other chronic pain: Secondary | ICD-10-CM

## 2020-09-10 DIAGNOSIS — M25562 Pain in left knee: Secondary | ICD-10-CM | POA: Diagnosis not present

## 2020-09-10 NOTE — Progress Notes (Signed)
Office Visit Note   Patient: Marvin Martin           Date of Birth: [birth year]           MRN: [id number] Visit Date: 09/10/2020              Requested by: Marvin Rakes, MD [place],  [place] [id number] PCP: Marvin Rakes, MD   Assessment & Plan: Visit Diagnoses:  1. Chronic pain of left knee     Plan: Impression is chronic left knee pain.  Although the patient's clinical exam and x-rays are fairly unremarkable, based on what he is saying I feel as though his pain is truly coming from his knee and not referred from somewhere else.  We discussed proceeding with a diagnostic and hopefully therapeutic cortisone injection today.  He will follow-up with [place] as needed.  Call with concerns or questions in the meantime.  Follow-Up Instructions: Return if symptoms worsen or fail to improve.   Orders:  Orders Placed This Encounter  Procedures   Large Joint Inj: L knee   No orders of the defined types were placed in this encounter.     Procedures: Large Joint Inj: L knee on 09/10/2020 9:11 AM Indications: pain Details: 22 G needle, anterolateral approach Medications: 2 mL lidocaine 1 %; 2 mL bupivacaine 0.25 %; 40 mg methylPREDNISolone acetate 40 MG/ML     Clinical Data: No additional findings.   Subjective: Chief Complaint  Patient presents with   Left Knee - Follow-up    HPI patient is a pleasant 55 year old gentleman who comes in today with left knee pain for the past month.  This began out of the blue without any specific injury or change in activity.  He was seen in the ED for this year.  He was provided Percocet which does help.  He comes in today for further evaluation treatment recommendation.  The pain he has is to the entire knee.  Worse going from a seated to standing position.  He denies any mechanical symptoms.  He is not taking anything for pain.  No previous cortisone injection.  He does have a history of gout to the right big toe but this  feels nothing like the pain he had with a gouty attack to this toe.  Review of Systems as detailed in HPI.  All others reviewed are negative.   Objective: Vital Signs: There were no vitals taken for this visit.  Physical Exam well-developed well-nourished gentleman in no acute distress.  Alert and oriented x3.  Ortho Exam left knee exam shows no effusion.  Range of motion 0 to 130 degrees.  No joint line tenderness.  He is stable valgus varus stress.  Calf is soft nontender.  Negative logroll negative FADIR negative straight leg raise.  He is neurovascular intact distally.  Specialty Comments:  No specialty comments available.  Imaging: X-rays reviewed by me in canopy show no acute or structural abnormalities   PMFS History: Patient Active Problem List   Diagnosis Date Noted   DM (diabetes mellitus), secondary uncontrolled ([place]) 07/29/2019   Hyponatremia 07/29/2019   Hypotension 07/29/2019   DKA (diabetic ketoacidoses) 07/29/2019   Drug-induced hyperglycemia 04/05/2019   Renal transplant, status post 04/05/2019   H/O heart transplant ([place]) 04/05/2019   Gout 01/07/2018   Unstable angina ([place]) 06/22/2015   Recurrent angina status post coronary stent placement ([place])    Acute kidney injury ([place])    Hypertensive heart disease 04/03/2015   Hyperlipidemia  04/03/2015   ST elevation (STEMI) myocardial infarction involving right coronary artery ([place]) 03/31/2015   Ischemic chest pain ([place])    Cardiomyopathy, ischemic    CKD (chronic kidney disease) stage 3, GFR 30-59 ml/min (HCC) [id number]   Chronic systolic heart failure ([place]) 01/07/2015   CAD in native artery 01/07/2015   Benign essential HTN 01/07/2015   CHF (congestive heart failure) ([place]) 12/11/2014   Past Medical History:  Diagnosis Date   Chronic systolic CHF (congestive heart failure) ([place])    a. 01/2015 Echo: EF 20-25%, antlat, inflat AK.   CKD (chronic kidney disease), stage III (HCC)    Coronary artery disease    a.  s/p MI x 4;  b. Reported h/o 17 stents with recurrent ISR;  c. 02/2014 Cath ([place]): PCI to unknown vessel;  d. 01/2015 MV: EF 18%, large scar, no ischemia; e. 03/2015 PCI: LM nl, LAD nl, D2 patent stent, RI patent stent, LCX patent stents p/m, RCA 95p ISR (PTCA), 78m 100d into RPL CTO, RPDA 90 (2.5x16 Synergy DES).   Hyperlipidemia    Hypertensive heart disease    Ischemic cardiomyopathy    a. s/p SJM ICD 2007 w/ gen change in 2012; b. 12/2014 CPX Test: mild to mod HF limitation w/ pVO2 20.3 and nl slope;  c. 01/2015 Echo: EF 20-25%.    Family History  Problem Relation Age of Onset   Cancer Mother        died when pt was 89  She had a h/o heart dzs as well.   Hypertension Mother    Hyperlipidemia Father        Father died when pt was age [id number]- he believes that he had a cardiac hx.   CAD Sister     Past Surgical History:  Procedure Laterality Date   168stints placements Bilateral    CARDIAC CATHETERIZATION     Most recent 02/2014    CARDIAC CATHETERIZATION N/A 03/31/2015   Procedure: Left Heart Cath and Coronary Angiography;  Surgeon: TTroy Sine MD;  [facility];  Service: Cardiovascular;  Laterality: N/A;   CARDIAC CATHETERIZATION N/A 03/31/2015   Procedure: Coronary Stent Intervention;  Surgeon: TTroy Sine MD;  [facility];  Service: Cardiovascular;  Laterality: N/A;   CARDIAC CATHETERIZATION N/A 05/27/2015   Procedure: Right/Left Heart Cath and Coronary Angiography;  Surgeon: DJolaine Artist MD;  [facility];  Service: Cardiovascular;  Laterality: N/A;   CARDIAC CATHETERIZATION N/A 06/23/2015   Procedure: Right/Left Heart Cath and Coronary Angiography;  Surgeon: DJolaine Artist MD;  [facility];  Service: Cardiovascular;  Laterality: N/A;   HEART TRANSPLANT     INSERT / REPLACE / REMOVE PACEMAKER     St jude 2006  Generator Change 2012    NEPHRECTOMY TRANSPLANTED ORGAN     PACEMAKER GENERATOR CHANGE Bilateral     Social History   Occupational History   Not on file  Tobacco Use   Smoking status: Never   Smokeless tobacco: Never  Vaping Use   Vaping Use: Never used  Substance and Sexual Activity   Alcohol use: No    Alcohol/week: 0.0 standard drinks   Drug use: No   Sexual activity: Not Currently

## 2020-09-11 MED ORDER — BUPIVACAINE HCL 0.25 % IJ SOLN
2.0000 mL | INTRAMUSCULAR | Status: AC | PRN
  Administered 2020-09-10: 2 mL via INTRA_ARTICULAR

## 2020-09-11 MED ORDER — METHYLPREDNISOLONE ACETATE 40 MG/ML IJ SUSP
40.0000 mg | INTRAMUSCULAR | Status: AC | PRN
  Administered 2020-09-10: 40 mg via INTRA_ARTICULAR

## 2020-09-11 MED ORDER — LIDOCAINE HCL 1 % IJ SOLN
2.0000 mL | INTRAMUSCULAR | Status: AC | PRN
  Administered 2020-09-10: 2 mL

## 2020-09-14 ENCOUNTER — Other Ambulatory Visit: Payer: Self-pay

## 2020-09-14 ENCOUNTER — Telehealth: Payer: Medicare Other | Admitting: Nurse Practitioner

## 2020-09-16 ENCOUNTER — Encounter (HOSPITAL_COMMUNITY): Payer: Self-pay | Admitting: Emergency Medicine

## 2020-09-16 ENCOUNTER — Emergency Department (HOSPITAL_COMMUNITY)
Admission: EM | Admit: 2020-09-16 | Discharge: 2020-09-16 | Disposition: A | Discharge status: Home / Self Care | Payer: Medicare Other | Attending: Emergency Medicine | Admitting: Emergency Medicine

## 2020-09-16 ENCOUNTER — Emergency Department (HOSPITAL_COMMUNITY): Payer: Medicare Other

## 2020-09-16 DIAGNOSIS — M25552 Pain in left hip: Secondary | ICD-10-CM | POA: Diagnosis not present

## 2020-09-16 DIAGNOSIS — N183 Chronic kidney disease, stage 3 unspecified: Secondary | ICD-10-CM | POA: Diagnosis not present

## 2020-09-16 DIAGNOSIS — I2510 Atherosclerotic heart disease of native coronary artery without angina pectoris: Secondary | ICD-10-CM | POA: Diagnosis not present

## 2020-09-16 DIAGNOSIS — M25562 Pain in left knee: Secondary | ICD-10-CM | POA: Diagnosis not present

## 2020-09-16 DIAGNOSIS — I5022 Chronic systolic (congestive) heart failure: Secondary | ICD-10-CM | POA: Diagnosis not present

## 2020-09-16 DIAGNOSIS — Z950 Presence of cardiac pacemaker: Secondary | ICD-10-CM | POA: Diagnosis not present

## 2020-09-16 DIAGNOSIS — E1110 Type 2 diabetes mellitus with ketoacidosis without coma: Secondary | ICD-10-CM | POA: Diagnosis not present

## 2020-09-16 DIAGNOSIS — R52 Pain, unspecified: Secondary | ICD-10-CM

## 2020-09-16 DIAGNOSIS — I130 Hypertensive heart and chronic kidney disease with heart failure and stage 1 through stage 4 chronic kidney disease, or unspecified chronic kidney disease: Secondary | ICD-10-CM | POA: Diagnosis not present

## 2020-09-16 DIAGNOSIS — E1122 Type 2 diabetes mellitus with diabetic chronic kidney disease: Secondary | ICD-10-CM | POA: Insufficient documentation

## 2020-09-16 DIAGNOSIS — Z794 Long term (current) use of insulin: Secondary | ICD-10-CM | POA: Diagnosis not present

## 2020-09-16 DIAGNOSIS — Z7982 Long term (current) use of aspirin: Secondary | ICD-10-CM | POA: Insufficient documentation

## 2020-09-16 MED ORDER — LIDOCAINE 5 % EX PTCH: 1.0000 | MEDICATED_PATCH | CUTANEOUS | 0 refills | Status: DC

## 2020-09-16 NOTE — ED Provider Notes (Signed)
Emergency Medicine Provider Triage Evaluation Note  Marvin Martin , a 55 y.o. male  was evaluated in triage.  Pt complains of left hip and left knee pain. Seen here previously for similar. Seen by Ortho outpatient, had steroid injection. Not helping. No recent injury or trauma. No IVDU, incontinence, saddle anesthesia, fever, abd pain, numbness, weakness  Review of Systems  Positive: Left hip, left knee pain Negative: Fever, emesis, abd pain, cp, numbness, weakness  Physical Exam  BP (!) 133/104 (BP Location: Right Arm)   Pulse (!) 105   Temp 97.8 F (36.6 C) (Oral)   Resp 20   SpO2 100%  Gen:   Awake, no distress   Resp:  Normal effort  MSK:   Moves extremities without difficulty, tenderness to left posterior hip Other:    Medical Decision Making  Medically screening exam initiated at 9:21 AM.  Appropriate orders placed.  Gib Aylsworth was informed that the remainder of the evaluation will be completed by another provider, this initial triage assessment does not replace that evaluation, and the importance of remaining in the ED until their evaluation is complete.  Left hip, knee pain     Neri Samek A, PA-C 09/16/20 [id number]    Carmin Muskrat, MD 09/16/20 1705

## 2020-09-16 NOTE — Discharge Instructions (Addendum)
Apply Lidoderm patch as prescribed.  Follow-up with your orthopedic or primary care providers.

## 2020-09-16 NOTE — ED Provider Notes (Signed)
[facility] EMERGENCY DEPARTMENT Provider Note   CSN: [id number] Arrival date & time: 09/16/20  0847     History No chief complaint on file.   Marvin Martin is a 55 y.o. male.  55 year old male presents with complaint of pain in his left hip x2 days without fall or injury.  Patient was in the ER last week with pain in his left knee, followed up with orthopedics who did a joint injection with improvement in his pain.  States he now has pain that radiates from his knee to his left buttock area which is worse with bearing weight.  Pain totally resolves with rest.  No other complaints or concerns.      Past Medical History:  Diagnosis Date   Chronic systolic CHF (congestive heart failure) ([place])    a. 01/2015 Echo: EF 20-25%, antlat, inflat AK.   CKD (chronic kidney disease), stage III (HCC)    Coronary artery disease    a. s/p MI x 4;  b. Reported h/o 17 stents with recurrent ISR;  c. 02/2014 Cath ([place]): PCI to unknown vessel;  d. 01/2015 MV: EF 18%, large scar, no ischemia; e. 03/2015 PCI: LM nl, LAD nl, D2 patent stent, RI patent stent, LCX patent stents p/m, RCA 95p ISR (PTCA), 75m 100d into RPL CTO, RPDA 90 (2.5x16 Synergy DES).   Hyperlipidemia    Hypertensive heart disease    Ischemic cardiomyopathy    a. s/p SJM ICD 2007 w/ gen change in 2012; b. 12/2014 CPX Test: mild to mod HF limitation w/ pVO2 20.3 and nl slope;  c. 01/2015 Echo: EF 20-25%.    Patient Active Problem List   Diagnosis Date Noted   DM (diabetes mellitus), secondary uncontrolled ([facility] 07/29/2019   Hyponatremia 07/29/2019   Hypotension 07/29/2019   DKA (diabetic ketoacidoses) 07/29/2019   Drug-induced hyperglycemia 04/05/2019   Renal transplant, status post 04/05/2019   H[place] heart transplant ([facility] 04/05/2019   Gout 01/07/2018   Unstable angina ([facility] 06/22/2015   Recurrent angina status post coronary stent placement ([facility]    Acute kidney injury ([facility]    Hypertensive heart disease 04/03/2015    Hyperlipidemia 04/03/2015   ST elevation (STEMI) myocardial infarction involving right coronary artery ([facility] 03/31/2015   Ischemic chest pain (HCC)    Cardiomyopathy, ischemic    CKD (chronic kidney disease) stage 3, GFR 30-59 ml/min (HCC) [id number]  Chronic systolic heart failure ([facility] 01/07/2015   CAD in native artery 01/07/2015   Benign essential HTN 01/07/2015   CHF (congestive heart failure) ([facility] 12/11/2014    Past Surgical History:  Procedure Laterality Date   17 stints placements Bilateral    CARDIAC CATHETERIZATION     Most recent 02/2014    CARDIAC CATHETERIZATION N/A 03/31/2015   Procedure: Left Heart Cath and Coronary Angiography;  Surgeon: TTroy Sine MD;  [facility];  Service: Cardiovascular;  Laterality: N/A;   CARDIAC CATHETERIZATION N/A 03/31/2015   Procedure: Coronary Stent Intervention;  Surgeon: TTroy Sine MD;  [facility];  Service: Cardiovascular;  Laterality: N/A;   CARDIAC CATHETERIZATION N/A 05/27/2015   Procedure: Right/Left Heart Cath and Coronary Angiography;  Surgeon: DJolaine Artist MD;  [facility];  Service: Cardiovascular;  Laterality: N/A;   CARDIAC CATHETERIZATION N/A 06/23/2015   Procedure: Right/Left Heart Cath and Coronary Angiography;  Surgeon: DJolaine Artist MD;  [facility];  Service: Cardiovascular;  Laterality: N/A;   HEART  TRANSPLANT     INSERT / REPLACE / REMOVE PACEMAKER     St jude 2006  Generator Change 2012    NEPHRECTOMY TRANSPLANTED ORGAN     PACEMAKER GENERATOR CHANGE Bilateral        Family History  Problem Relation Age of Onset   Cancer Mother        died when pt was 64.  She had a h/o heart dzs as well.   Hypertension Mother    Hyperlipidemia Father        Father died when pt was age 56 - he believes that he had a cardiac hx.   CAD Sister     Social History   Tobacco Use   Smoking status: Never   Smokeless tobacco: Never  Vaping  Use   Vaping Use: Never used  Substance Use Topics   Alcohol use: No    Alcohol/week: 0.0 standard drinks   Drug use: No    Home Medications Prior to Admission medications   Medication Sig Start Date End Date Taking? Authorizing Provider  lidocaine (LIDODERM) 5 % Place 1 patch onto the skin daily. Remove & Discard patch within 12 hours or as directed by MD 09/16/20  Yes Tacy Learn, PA-C  aspirin 81 MG tablet Take 81 mg by mouth daily.    [provider]  Calcium Carbonate-Vitamin D 600-400 MG-UNIT tablet Take 1 tablet by mouth daily. 01/06/19 01/06/20  [provider]  carvedilol (COREG) 12.5 MG tablet Take 12.5 mg by mouth 2 (two) times daily. 03/23/19   [provider]  cholecalciferol (VITAMIN D) 1000 UNITS tablet Take 1,000 Units by mouth daily.    [provider]  Continuous Blood Gluc Receiver (FREESTYLE LIBRE 2 READER) DEVI 3 Units by Does not apply route in the morning, at noon, and at bedtime. 07/31/19   Georgette Shell, MD  Continuous Blood Gluc Sensor (FREESTYLE LIBRE 2 SENSOR) MISC 3 Units by Does not apply route in the morning, at noon, and at bedtime. 07/31/19   Georgette Shell, MD  doxazosin (CARDURA) 2 MG tablet Take 2 mg by mouth at bedtime. 03/11/19   [provider]  ezetimibe (ZETIA) 10 MG tablet Take 1 tablet (10 mg total) by mouth daily. Needs appt for further refills 05/27/19   Bensimhon, Shaune Pascal, MD  HYDROcodone-acetaminophen (NORCO/VICODIN) 5-325 MG tablet Take 1 tablet by mouth every 4 (four) hours as needed. Patient not taking: Reported on 02/25/2020 12/16/17   Recardo Evangelist, PA-C  insulin aspart (NOVOLOG FLEXPEN) 100 UNIT/ML FlexPen Inject 8 Units into the skin 3 (three) times daily with meals. Per sliding scale 03/11/20   Charlott Rakes, MD  insulin glargine (LANTUS SOLOSTAR) 100 UNIT/ML Solostar Pen Inject 20 Units into the skin daily. 03/11/20   Charlott Rakes, MD  Insulin Pen Needle 32G X 4 MM MISC 1  application by Does not apply route 4 (four) times daily. 04/07/19   Guilford Shi, MD  isosorbide mononitrate (IMDUR) 30 MG 24 hr tablet TAKE 1 TABLET BY MOUTH EVERY DAY 08/13/19   Bensimhon, Shaune Pascal, MD  magnesium oxide (MAG-OX) 400 (241.3 Mg) MG tablet Take 400 mg by mouth 2 (two) times daily.  03/11/19   [provider]  nitroGLYCERIN (NITROSTAT) 0.4 MG SL tablet Place 1 tablet (0.4 mg total) under the tongue every 5 (five) minutes as needed for chest pain. Reported on 04/12/2015 06/03/15   Charlott Rakes, MD  nystatin (MYCOSTATIN) 100000 UNIT/ML suspension Take 5 mLs by  mouth See admin instructions. Swish and swallow 5 ml's by mouth four times a day 02/21/19   [provider]  omega-3 acid ethyl esters (LOVAZA) 1 G capsule Take 1 g by mouth daily.     [provider]  ondansetron (ZOFRAN) 4 MG tablet Take 1 tablet (4 mg total) every 8 (eight) hours as needed by mouth for nausea or vomiting. Reported on 05/24/2015 11/20/16   Darrick Grinder D, NP  oxyCODONE-acetaminophen (PERCOCET/ROXICET) 5-325 MG tablet Take 1 tablet by mouth every 6 (six) hours as needed for severe pain. 09/07/20   Venter, Margaux, PA-C  PRALUENT 75 MG/ML SOAJ INJECT 75 MG INTO THE SKIN EVERY 14 (FOURTEEN) DAYS. 05/01/19   Bensimhon, Shaune Pascal, MD  predniSONE (DELTASONE) 5 MG tablet Take 1 tablet by mouth in the morning.  06/18/19   [provider]  Semaglutide, 1 MG/DOSE, (OZEMPIC, 1 MG/DOSE,) 2 MG/1.5ML SOPN Inject 1 mg into the skin once a week. 03/11/20   Charlott Rakes, MD    Allergies    Patient has no known allergies.  Review of Systems   Review of Systems  Constitutional:  Negative for fever.  Gastrointestinal:  Negative for abdominal pain.  Musculoskeletal:  Positive for arthralgias. Negative for back pain and myalgias.  Skin:  Negative for rash and wound.  Neurological:  Negative for weakness and numbness.   Physical Exam Updated Vital Signs BP (!) 133/104 (BP Location: Right Arm)    Pulse (!) 105   Temp 97.8 F (36.6 C) (Oral)   Resp 20   SpO2 100%   Physical Exam Vitals and nursing note reviewed.  Constitutional:      General: He is not in acute distress.    Appearance: He is well-developed. He is not diaphoretic.  HENT:     Head: Normocephalic and atraumatic.  Cardiovascular:     Pulses: Normal pulses.  Pulmonary:     Effort: Pulmonary effort is normal.  Abdominal:     Palpations: Abdomen is soft.     Tenderness: There is no abdominal tenderness.  Musculoskeletal:        General: No swelling, tenderness, deformity or signs of injury. Normal range of motion.     Lumbar back: No tenderness or bony tenderness. Negative right straight leg raise test and negative left straight leg raise test.     Right lower leg: No edema.     Left lower leg: No edema.  Skin:    General: Skin is warm and dry.     Findings: No erythema, lesion or rash.  Neurological:     Mental Status: He is alert and oriented to person, place, and time.     Sensory: No sensory deficit.     Motor: No weakness.     Gait: Gait is intact.  Psychiatric:        Behavior: Behavior normal.    ED Results / Procedures / Treatments   Labs (all labs ordered are listed, but only abnormal results are displayed) Labs Reviewed - No data to display  EKG None  Radiology DG HIP UNILAT WITH PELVIS 2-3 VIEWS LEFT  Result Date: 09/16/2020 CLINICAL DATA:  Left hip pain.  No known trauma. EXAM: DG HIP (WITH OR WITHOUT PELVIS) 2-3V LEFT COMPARISON:  None. FINDINGS: There is no evidence of hip fracture or dislocation. There is no evidence of arthropathy or other focal bone abnormality. IMPRESSION: Negative. Electronically Signed   By: Kerby Moors M.D.   On: 09/16/2020 10:17  Procedures Procedures   Medications Ordered in ED Medications - No data to display  ED Course  I have reviewed the triage vital signs and the nursing notes.  Pertinent labs & imaging results that were available during my  care of the patient were reviewed by me and considered in my medical decision making (see chart for details).  Clinical Course as of 09/16/20 1046  Thu Sep 16, 2020  2861 Two 36-year-old male with complaint of pain in his left hip as above.  There is no tenderness at the side, straight leg raise negative.  Leg strength symmetric, sensation intact, DP pulses present. X-ray is unremarkable. Plan is to treat with Lidoderm patch.  Referred back to his orthopedic or PCP. [LM]    Clinical Course User Index [LM] Roque Lias   MDM Rules/Calculators/A&P                           Final Clinical Impression(s) / ED Diagnoses Final diagnoses:  Pain  Left hip pain    Rx / DC Orders ED Discharge Orders          Ordered    lidocaine (LIDODERM) 5 %  Every 24 hours        09/16/20 1044             Roque Lias 09/16/20 1047    Wyvonnia Dusky, MD 09/16/20 1118

## 2020-09-16 NOTE — ED Triage Notes (Signed)
Pt back to the ED today with c/o left hip pain , pt was seen for knee pain recently on same side and had a cortisone shot done to that knee

## 2020-09-24 DIAGNOSIS — Z7982 Long term (current) use of aspirin: Secondary | ICD-10-CM | POA: Diagnosis not present

## 2020-09-24 DIAGNOSIS — M25562 Pain in left knee: Secondary | ICD-10-CM | POA: Diagnosis not present

## 2020-09-24 DIAGNOSIS — Z940 Kidney transplant status: Secondary | ICD-10-CM | POA: Diagnosis not present

## 2020-09-24 DIAGNOSIS — I10 Essential (primary) hypertension: Secondary | ICD-10-CM | POA: Diagnosis not present

## 2020-09-24 DIAGNOSIS — Z79899 Other long term (current) drug therapy: Secondary | ICD-10-CM | POA: Diagnosis not present

## 2020-09-24 DIAGNOSIS — Z794 Long term (current) use of insulin: Secondary | ICD-10-CM | POA: Diagnosis not present

## 2020-09-24 DIAGNOSIS — Z4821 Encounter for aftercare following heart transplant: Secondary | ICD-10-CM | POA: Diagnosis not present

## 2020-09-24 DIAGNOSIS — E785 Hyperlipidemia, unspecified: Secondary | ICD-10-CM | POA: Diagnosis not present

## 2020-09-24 DIAGNOSIS — D84821 Immunodeficiency due to drugs: Secondary | ICD-10-CM | POA: Diagnosis not present

## 2020-09-24 DIAGNOSIS — Z48298 Encounter for aftercare following other organ transplant: Secondary | ICD-10-CM | POA: Diagnosis not present

## 2020-09-24 DIAGNOSIS — R269 Unspecified abnormalities of gait and mobility: Secondary | ICD-10-CM | POA: Diagnosis not present

## 2020-09-24 DIAGNOSIS — Z941 Heart transplant status: Secondary | ICD-10-CM | POA: Diagnosis not present

## 2020-09-24 DIAGNOSIS — E119 Type 2 diabetes mellitus without complications: Secondary | ICD-10-CM | POA: Diagnosis not present

## 2020-10-07 ENCOUNTER — Encounter: Payer: Self-pay | Admitting: Family Medicine

## 2020-10-07 ENCOUNTER — Ambulatory Visit: Payer: Medicare Other | Attending: Family Medicine | Admitting: Family Medicine

## 2020-10-07 ENCOUNTER — Other Ambulatory Visit: Payer: Self-pay

## 2020-10-07 VITALS — BP 140/96 | HR 103 | Resp 16 | Wt 140.6 lb

## 2020-10-07 DIAGNOSIS — E1165 Type 2 diabetes mellitus with hyperglycemia: Secondary | ICD-10-CM | POA: Diagnosis not present

## 2020-10-07 DIAGNOSIS — Z8249 Family history of ischemic heart disease and other diseases of the circulatory system: Secondary | ICD-10-CM | POA: Diagnosis not present

## 2020-10-07 DIAGNOSIS — Z713 Dietary counseling and surveillance: Secondary | ICD-10-CM | POA: Insufficient documentation

## 2020-10-07 DIAGNOSIS — Z955 Presence of coronary angioplasty implant and graft: Secondary | ICD-10-CM | POA: Insufficient documentation

## 2020-10-07 DIAGNOSIS — Z940 Kidney transplant status: Secondary | ICD-10-CM | POA: Insufficient documentation

## 2020-10-07 DIAGNOSIS — I252 Old myocardial infarction: Secondary | ICD-10-CM | POA: Diagnosis not present

## 2020-10-07 DIAGNOSIS — Z79899 Other long term (current) drug therapy: Secondary | ICD-10-CM | POA: Diagnosis not present

## 2020-10-07 DIAGNOSIS — E1169 Type 2 diabetes mellitus with other specified complication: Secondary | ICD-10-CM | POA: Diagnosis not present

## 2020-10-07 DIAGNOSIS — G8929 Other chronic pain: Secondary | ICD-10-CM | POA: Diagnosis not present

## 2020-10-07 DIAGNOSIS — I25811 Atherosclerosis of native coronary artery of transplanted heart without angina pectoris: Secondary | ICD-10-CM | POA: Diagnosis not present

## 2020-10-07 DIAGNOSIS — Z941 Heart transplant status: Secondary | ICD-10-CM | POA: Diagnosis not present

## 2020-10-07 DIAGNOSIS — E1122 Type 2 diabetes mellitus with diabetic chronic kidney disease: Secondary | ICD-10-CM | POA: Diagnosis not present

## 2020-10-07 DIAGNOSIS — I152 Hypertension secondary to endocrine disorders: Secondary | ICD-10-CM | POA: Diagnosis not present

## 2020-10-07 DIAGNOSIS — D84821 Immunodeficiency due to drugs: Secondary | ICD-10-CM | POA: Insufficient documentation

## 2020-10-07 DIAGNOSIS — Z950 Presence of cardiac pacemaker: Secondary | ICD-10-CM | POA: Insufficient documentation

## 2020-10-07 DIAGNOSIS — E1159 Type 2 diabetes mellitus with other circulatory complications: Secondary | ICD-10-CM

## 2020-10-07 DIAGNOSIS — Z7982 Long term (current) use of aspirin: Secondary | ICD-10-CM | POA: Insufficient documentation

## 2020-10-07 DIAGNOSIS — M25562 Pain in left knee: Secondary | ICD-10-CM | POA: Diagnosis not present

## 2020-10-07 DIAGNOSIS — Z794 Long term (current) use of insulin: Secondary | ICD-10-CM

## 2020-10-07 DIAGNOSIS — I130 Hypertensive heart and chronic kidney disease with heart failure and stage 1 through stage 4 chronic kidney disease, or unspecified chronic kidney disease: Secondary | ICD-10-CM | POA: Insufficient documentation

## 2020-10-07 LAB — POCT GLYCOSYLATED HEMOGLOBIN (HGB A1C): HbA1c, POC (controlled diabetic range): 6.3 % (ref 0.0–7.0)

## 2020-10-07 LAB — GLUCOSE, POCT (MANUAL RESULT ENTRY): POC Glucose: 192 mg/dl — AB (ref 70–99)

## 2020-10-07 MED ORDER — DICLOFENAC SODIUM 1 % EX GEL: 4.0000 g | Freq: Four times a day (QID) | CUTANEOUS | 1 refills | Status: DC

## 2020-10-07 NOTE — Patient Instructions (Signed)
Chronic Knee Pain, Adult °Chronic knee pain is pain in one or both knees that lasts longer than 3 months. Symptoms of chronic knee pain may include swelling, stiffness, and discomfort. Age-related wear and tear (osteoarthritis) of the knee joint is the most common cause of chronic knee pain. Other possible causes include: °A long-term immune-related disease that causes inflammation of the knee (rheumatoid arthritis). This usually affects both knees. °Inflammatory arthritis, such as gout or pseudogout. °An injury to the knee that causes arthritis. °An injury to the knee that damages the ligaments. Ligaments are strong tissues that connect bones to each other. °Runner's knee or pain behind the kneecap. °Treatment for chronic knee pain depends on the cause. The main treatments for chronic knee pain are physical therapy and weight loss. This condition may also be treated with medicines, injections, a knee sleeve or brace, and by using crutches. Rest, ice, pressure (compression), and elevation, also known as RICE therapy, may also be recommended. °Follow these instructions at home: °If you have a knee sleeve or brace: ° °Wear the knee sleeve or brace as told by your health care provider. Remove it only as told by your health care provider. °Loosen it if your toes tingle, become numb, or turn cold and blue. °Keep it clean. °If the sleeve or brace is not waterproof: °Do not let it get wet. °Remove it if allowed by your health care provider, or cover it with a watertight covering when you take a bath or a shower. °Managing pain, stiffness, and swelling °  °If directed, apply heat to the affected area as often as told by your health care provider. Use the heat source that your health care provider recommends, such as a moist heat pack or a heating pad. °If you have a removable knee sleeve or brace, remove it as told by your health care provider. °Place a towel between your skin and the heat source. °Leave the heat on for  20-30 minutes. °Remove the heat if your skin turns bright red. This is especially important if you are unable to feel pain, heat, or cold. You may have a greater risk of getting burned. °If directed, put ice on the affected area. To do this: °If you have a removable knee sleeve or brace, remove it as told by your health care provider. °Put ice in a plastic bag. °Place a towel between your skin and the bag. °Leave the ice on for 20 minutes, 2-3 times a day. °Remove the ice if your skin turns bright red. This is very important. If you cannot feel pain, heat, or cold, you have a greater risk of damage to the area. °Move your toes often to reduce stiffness and swelling. °Raise (elevate) the injured area above the level of your heart while you are sitting or lying down. °Activity °Avoid high-impact activities or exercises, such as running, jumping rope, or doing jumping jacks. °Follow the exercise plan that your health care provider designed for you. Your health care provider may suggest that you: °Avoid activities that make knee pain worse. This may require you to change your exercise routines, sport participation, or job duties. °Wear shoes with cushioned soles. °Avoid sports that require running and sudden changes in direction. °Do physical therapy. Physical therapy is planned to match your needs and abilities. It may include exercises for strength, flexibility, stability, and endurance. °Do exercises that increase balance and strength, such as tai chi and yoga. °Do not use the injured limb to support your body weight   until your health care provider says that you can. Use crutches as told by your health care provider. °Return to your normal activities as told by your health care provider. Ask your health care provider what activities are safe for you. °General instructions °Take over-the-counter and prescription medicines only as told by your health care provider. °Lose weight if you are overweight. Losing even a  little weight can reduce knee pain. Ask your health care provider what your ideal weight is, and how to safely lose extra weight. A dietitian may be able to help you plan your meals. °Do not use any products that contain nicotine or tobacco, such as cigarettes, e-cigarettes, and chewing tobacco. These can delay healing. If you need help quitting, ask your health care provider. °Keep all follow-up visits. This is important. °Contact a health care provider if: °You have knee pain that is not getting better or gets worse. °You are unable to do your physical therapy exercises due to knee pain. °Get help right away if: °Your knee swells and the swelling becomes worse. °You cannot move your knee. °You have severe knee pain. °Summary °Knee pain that lasts more than 3 months is considered chronic knee pain. °The main treatments for chronic knee pain are physical therapy and weight loss. You may also need to take medicines, wear a knee sleeve or brace, use crutches, and apply ice or heat. °Losing even a little weight can reduce knee pain. Ask your health care provider what your ideal weight is, and how to safely lose extra weight. A dietitian may be able to help you plan your meals. °Follow the exercise plan that your health care provider designed for you. °This information is not intended to replace advice given to you by your health care provider. Make sure you discuss any questions you have with your health care provider. °Document Revised: 06/18/2019 Document Reviewed: 06/18/2019 °Elsevier Patient Education © 2022 [employer] ° °

## 2020-10-07 NOTE — Progress Notes (Signed)
Subjective:  Patient ID: Marvin Martin, male    DOB: Jul 17, 1965  Age: 55 y.o. MRN: [id number]  CC: Hospitalization Follow-up (ED)   HPI Marvin Martin is a 55 y.o. year old male with a history of type 2 diabetes mellitus (A1c 6.3), ischemic cardiomyopathy (status post heart transplant at [facility] in 12/2018 currently on immunosuppressive therapy), chronic kidney disease stage III (status post renal transplant in 06/2019).  Interval History: He had an ED visit for left hip pain on 09/16/2020.  Notes reveal he had complained of both left knee and left hip pain; left hip x-ray was unremarkable. His L knee continues to hurt even after seen by Orthopedic -Dr Erlinda Hong and received a L Cortisone injection with slight improvement after which pain returned. At night pain radiates to his L hip. Pain has been present > 66monthand is insiduos no aggravating factors.  Pain causes him to limp. Notes from orthopedic reviewed.  Left knee x-ray was unremarkable last month.  A1c is 6.3 and he endorses compliance with his medications. He has no hypoglycemia, neuropathy symptoms, visual symptoms. [facility]. Denies additional concerns. Past Medical History:  Diagnosis Date   Chronic systolic CHF (congestive heart failure) [place]    a. 01/2015 Echo: EF 20-25%, antlat, inflat AK.   CKD (chronic kidney disease), stage III (HCC)    Coronary artery disease    a. s/p MI x 4;  b. Reported h/o 17 stents with recurrent ISR;  c. [place] to [place];  d. 01/2015 MV: EF 18%, large scar, no ischemia; e. 03/2015 PCI: LM nl, LAD nl, D2 patent stent, RI patent stent, LCX patent stents p/m, RCA 95p ISR (PTCA), 575m100d into RPL CTO, RPDA 90 (2.5x16 Synergy DES).   Hyperlipidemia    Hypertensive heart disease    Ischemic cardiomyopathy    a. s/p SJM ICD 2007 w/ gen change in 2012; b. 12/2014 CPX Test: mild to mod HF limitation w/  pVO2 20.3 and nl slope;  c. 01/2015 Echo: EF 20-25%.    Past Surgical History:  Procedure Laterality Date   1728tints placements Bilateral    CARDIAC CATHETERIZATION     Most recent 02/2014    CARDIAC CATHETERIZATION N/A 03/31/2015   Procedure: Left Heart Cath and Coronary Angiography;  Surgeon: ThTroy SineMD;  [place] [facility];  Service: Cardiovascular;  Laterality: N/A;   CARDIAC CATHETERIZATION N/A 03/31/2015   Procedure: Coronary Stent Intervention;  Surgeon: ThTroy SineMD;  [place] [facility];  Service: Cardiovascular;  Laterality: N/A;   CARDIAC CATHETERIZATION N/A 05/27/2015   Procedure: Right/Left Heart Cath and Coronary Angiography;  Surgeon: DaJolaine ArtistMD;  [place];  Service: Cardiovascular;  Laterality: N/A;   CARDIAC CATHETERIZATION N/A 06/23/2015   Procedure: Right/Left Heart Cath and Coronary Angiography;  Surgeon: DaJolaine ArtistMD;  [place] [facility];  Service: Cardiovascular;  Laterality: N/A;   HEART TRANSPLANT     INSERT / REPLACE / REMOVE PACEMAKER     St jude 2006  Generator Change 2012    NEPHRECTOMY TRANSPLANTED ORGAN     PACEMAKER GENERATOR CHANGE Bilateral     Family History  Problem Relation Age of Onset   Cancer Mother        died when pt was [age] She had a h/o heart dzs as well.   Hypertension Mother  Hyperlipidemia Father        Father died when pt was age 17 - he believes that he had a cardiac hx.   CAD Sister     No Known Allergies  Outpatient Medications Prior to Visit  Medication Sig Dispense Refill   aspirin 81 MG tablet Take 81 mg by mouth daily.     Calcium Carbonate-Vitamin D 600-400 MG-UNIT tablet Take 1 tablet by mouth daily.     carvedilol (COREG) 12.5 MG tablet Take 12.5 mg by mouth 2 (two) times daily.     cholecalciferol (VITAMIN D) 1000 UNITS tablet Take 1,000 Units by mouth daily.     Continuous Blood Gluc Receiver (FREESTYLE LIBRE 2 READER) DEVI 3 Units by  Does not apply route in the morning, at noon, and at bedtime. 1 each 4   Continuous Blood Gluc Sensor (FREESTYLE LIBRE 2 SENSOR) MISC 3 Units by Does not apply route in the morning, at noon, and at bedtime. 1 each 4   doxazosin (CARDURA) 2 MG tablet Take 2 mg by mouth at bedtime.     ezetimibe (ZETIA) 10 MG tablet Take 1 tablet (10 mg total) by mouth daily. Needs appt for further refills 90 tablet 0   HYDROcodone-acetaminophen (NORCO/VICODIN) 5-325 MG tablet Take 1 tablet by mouth every 4 (four) hours as needed. (Patient not taking: Reported on 02/25/2020) 10 tablet 0   insulin aspart (NOVOLOG FLEXPEN) 100 UNIT/ML FlexPen Inject 8 Units into the skin 3 (three) times daily with meals. Per sliding scale 15 mL 1   insulin glargine (LANTUS SOLOSTAR) 100 UNIT/ML Solostar Pen Inject 20 Units into the skin daily. 30 mL 6   Insulin Pen Needle 32G X 4 MM MISC 1 application by Does not apply route 4 (four) times daily. 200 each 5   isosorbide mononitrate (IMDUR) 30 MG 24 hr tablet TAKE 1 TABLET BY MOUTH EVERY DAY 90 tablet 3   lidocaine (LIDODERM) 5 % Place 1 patch onto the skin daily. Remove & Discard patch within 12 hours or as directed by MD 30 patch 0   magnesium oxide (MAG-OX) 400 (241.3 Mg) MG tablet Take 400 mg by mouth 2 (two) times daily.      nitroGLYCERIN (NITROSTAT) 0.4 MG SL tablet Place 1 tablet (0.4 mg total) under the tongue every 5 (five) minutes as needed for chest pain. Reported on 04/12/2015 60 tablet 1   nystatin (MYCOSTATIN) 100000 UNIT/ML suspension Take 5 mLs by mouth See admin instructions. Swish and swallow 5 ml's by mouth four times a day     omega-3 acid ethyl esters (LOVAZA) 1 G capsule Take 1 g by mouth daily.      ondansetron (ZOFRAN) 4 MG tablet Take 1 tablet (4 mg total) every 8 (eight) hours as needed by mouth for nausea or vomiting. Reported on 05/24/2015 30 tablet 1   oxyCODONE-acetaminophen (PERCOCET/ROXICET) 5-325 MG tablet Take 1 tablet by mouth every 6 (six) hours as needed  for severe pain. 10 tablet 0   PRALUENT 75 MG/ML SOAJ INJECT 75 MG INTO THE SKIN EVERY 14 (FOURTEEN) DAYS. 6 pen 1   predniSONE (DELTASONE) 5 MG tablet Take 1 tablet by mouth in the morning.      Semaglutide, 1 MG/DOSE, (OZEMPIC, 1 MG/DOSE,) 2 MG/1.5ML SOPN Inject 1 mg into the skin once a week. 3 mL 2   No facility-administered medications prior to visit.     ROS Review of Systems  Constitutional:  Negative for activity change and appetite change.  HENT:  Negative for sinus pressure and sore throat.   Eyes:  Negative for visual disturbance.  Respiratory:  Negative for cough, chest tightness and shortness of breath.   Cardiovascular:  Negative for chest pain and leg swelling.  Gastrointestinal:  Negative for abdominal distention, abdominal pain, constipation and diarrhea.  Endocrine: Negative.   Genitourinary:  Negative for dysuria.  Musculoskeletal:        See HPI  Skin:  Negative for rash.  Allergic/Immunologic: Negative.   Neurological:  Negative for weakness, light-headedness and numbness.  Psychiatric/Behavioral:  Negative for dysphoric mood and suicidal ideas.    Objective:  BP (!) 140/96   Pulse (!) 103   Resp 16   Wt 140 lb 9.6 oz (63.8 kg)   SpO2 99%   BMI 22.02 kg/m   BP/Weight 10/07/2020 09/16/2020 123XX123  Systolic BP [id number]  Diastolic BP 96 123456 98  Wt. (Lbs) 140.6 - -  BMI 22.02 - -      Physical Exam Constitutional:      Appearance: He is well-developed.  Cardiovascular:     Rate and Rhythm: Tachycardia present.     Heart sounds: Normal heart sounds. No murmur heard. Pulmonary:     Effort: Pulmonary effort is normal.     Breath sounds: Normal breath sounds. No wheezing or rales.  Chest:     Chest wall: No tenderness.  Abdominal:     General: Bowel sounds are normal. There is no distension.     Palpations: Abdomen is soft. There is no mass.     Tenderness: There is no abdominal tenderness.  Musculoskeletal:     Right lower leg: No edema.      Left lower leg: No edema.     Comments: No knee edema.   Crepitus on range of motion of both knees Tenderness on flexion and extension of left knee.   Neurological:     Mental Status: He is alert and oriented to person, place, and time.  Psychiatric:        Mood and Affect: Mood normal.   Diabetic Foot Exam - Simple   Simple Foot Form Visual Inspection No deformities, no ulcerations, no other skin breakdown bilaterally: Yes Sensation Testing Intact to touch and monofilament testing bilaterally: Yes Pulse Check Posterior Tibialis and Dorsalis pulse intact bilaterally: Yes Comments     CMP Latest Ref Rng & Units 04/27/2020 04/27/2020 04/27/2020  Glucose 70 - 99 mg/dL - 635(HH) 656(HH)  BUN 6 - 20 mg/dL - 27(H) 23(H)  Creatinine 0.61 - 1.24 mg/dL - 1.30(H) 1.45(H)  Sodium 135 - 145 mmol/L 129(L) 130(L) 129(L)  Potassium 3.5 - 5.1 mmol/L 4.7 4.7 5.0  Chloride 98 - 111 mmol/L - 94(L) 93(L)  CO2 22 - 32 mmol/L - - 25  Calcium 8.9 - 10.3 mg/dL - - 9.8  Total Protein 6.5 - 8.1 g/dL - - 7.4  Total Bilirubin 0.3 - 1.2 mg/dL - - 0.8  Alkaline Phos 38 - 126 U/L - - 100  AST 15 - 41 U/L - - 15  ALT 0 - 44 U/L - - 16    Lipid Panel     Component Value Date/Time   CHOL 88 (L) 08/21/2016 1037   TRIG 92 08/21/2016 1037   HDL 47 08/21/2016 1037   CHOLHDL 1.9 08/21/2016 1037   CHOLHDL 2.3 06/22/2015 1609   VLDL 42 (H) 06/22/2015 1609   LDLCALC 23 08/21/2016 1037    CBC    Component Value Date/Time  WBC 7.9 04/27/2020 1537   RBC 4.51 04/27/2020 1537   HGB 15.3 04/27/2020 1603   HGB 15.3 04/27/2020 1603   HGB 15.0 05/08/2018 0800   HCT 45.0 04/27/2020 1603   HCT 45.0 04/27/2020 1603   HCT 43.2 05/08/2018 0800   PLT 261 04/27/2020 1537   PLT 203 05/08/2018 0800   MCV 94.2 04/27/2020 1537   MCV 98 (H) 05/08/2018 0800   MCH 31.7 04/27/2020 1537   MCHC 33.6 04/27/2020 1537   RDW 12.3 04/27/2020 1537   RDW 12.3 05/08/2018 0800   LYMPHSABS 0.6 (L) 07/29/2019 1450    LYMPHSABS 1.3 05/08/2018 0800   MONOABS 0.4 07/29/2019 1450   EOSABS 0.0 07/29/2019 1450   EOSABS 0.1 05/08/2018 0800   BASOSABS 0.0 07/29/2019 1450   BASOSABS 0.0 05/08/2018 0800    Lab Results  Component Value Date   HGBA1C 6.3 10/07/2020    Assessment & Plan:  1. Type 2 diabetes mellitus with hyperglycemia, with long-term current use of insulin (HCC) Controlled Commended on improvement Discussed management of hypoglycemia Continue current management - POCT glucose (manual entry) - POCT glycosylated hemoglobin (Hb A1C) - Microalbumin / creatinine urine ratio; Future - Ambulatory referral to Ophthalmology - Lipid panel; Future - Lipid panel - Microalbumin / creatinine urine ratio  2. Chronic pain of left knee Uncontrolled S/p Cortisone injection Advised to use knee brace, apply ice Placed on topical Voltaren - diclofenac Sodium (VOLTAREN) 1 % GEL; Apply 4 g topically 4 (four) times daily.  Dispense: 100 g; Refill: 1 - CBC with Differential/Platelet; Future - Basic Metabolic Panel; Future - Basic Metabolic Panel - CBC with Differential/Platelet  3. Hypertension associated with diabetes ([place]) Slightly above goal No regimen change today as pain could be contributing Continue antihypertensives as per transplant Clinic Counseled on blood pressure goal of less than 130/80, low-sodium, DASH diet, medication compliance, 150 minutes of moderate intensity exercise per week. Discussed medication compliance, adverse effects.   4. H/O heart transplant ([place]) Stable Currently on immunosuppressants Follow up with [facility] transplant team  5. Renal transplant, status post See #4 above    No orders of the defined types were placed in this encounter.   Return in about 6 months (around 04/06/2021) for Medical conditions.        Charlott Rakes, MD, FAAFP. [facility] [place], [place]   10/07/2020, 10:25 AM

## 2020-10-08 ENCOUNTER — Encounter: Payer: Self-pay | Admitting: Family Medicine

## 2020-10-08 DIAGNOSIS — Z794 Long term (current) use of insulin: Secondary | ICD-10-CM | POA: Diagnosis not present

## 2020-10-08 DIAGNOSIS — G8929 Other chronic pain: Secondary | ICD-10-CM | POA: Diagnosis not present

## 2020-10-08 DIAGNOSIS — M25561 Pain in right knee: Secondary | ICD-10-CM | POA: Diagnosis not present

## 2020-10-08 DIAGNOSIS — E1165 Type 2 diabetes mellitus with hyperglycemia: Secondary | ICD-10-CM | POA: Diagnosis not present

## 2020-10-09 LAB — CBC WITH DIFFERENTIAL/PLATELET
Basophils Absolute: 0 10*3/uL (ref 0.0–0.2)
Basos: 1 %
EOS (ABSOLUTE): 0.1 10*3/uL (ref 0.0–0.4)
Eos: 1 %
Hematocrit: 46.9 % (ref 37.5–51.0)
Hemoglobin: 15.7 g/dL (ref 13.0–17.7)
Immature Grans (Abs): 0 10*3/uL (ref 0.0–0.1)
Immature Granulocytes: 0 %
Lymphocytes Absolute: 1.6 10*3/uL (ref 0.7–3.1)
Lymphs: 26 %
MCH: 32.1 pg (ref 26.6–33.0)
MCHC: 33.5 g/dL (ref 31.5–35.7)
MCV: 96 fL (ref 79–97)
Monocytes Absolute: 0.5 10*3/uL (ref 0.1–0.9)
Monocytes: 7 %
Neutrophils Absolute: 4.1 10*3/uL (ref 1.4–7.0)
Neutrophils: 65 %
Platelets: 240 10*3/uL (ref 150–450)
RBC: 4.89 x10E6/uL (ref 4.14–5.80)
RDW: 12.3 % (ref 11.6–15.4)
WBC: 6.3 10*3/uL (ref 3.4–10.8)

## 2020-10-09 LAB — LIPID PANEL
Chol/HDL Ratio: 5.5 ratio — ABNORMAL HIGH (ref 0.0–5.0)
Cholesterol, Total: 254 mg/dL — ABNORMAL HIGH (ref 100–199)
HDL: 46 mg/dL (ref >39)
LDL Chol Calc (NIH): 145 mg/dL — ABNORMAL HIGH (ref 0–99)
Triglycerides: 345 mg/dL — ABNORMAL HIGH (ref 0–149)
VLDL Cholesterol Cal: 63 mg/dL — ABNORMAL HIGH (ref 5–40)

## 2020-10-09 LAB — BASIC METABOLIC PANEL
BUN/Creatinine Ratio: 16 (ref 9–20)
BUN: 18 mg/dL (ref 6–24)
CO2: 21 mmol/L (ref 20–29)
Calcium: 10.1 mg/dL (ref 8.7–10.2)
Chloride: 96 mmol/L (ref 96–106)
Creatinine, Ser: 1.12 mg/dL (ref 0.76–1.27)
Glucose: 196 mg/dL — ABNORMAL HIGH (ref 65–99)
Potassium: 5.1 mmol/L (ref 3.5–5.2)
Sodium: 138 mmol/L (ref 134–144)
eGFR: 78 mL/min/{1.73_m2} (ref >59)

## 2020-10-09 LAB — MICROALBUMIN / CREATININE URINE RATIO
Creatinine, Urine: 173.3 mg/dL
Microalb/Creat Ratio: 11 mg/g creat (ref 0–29)
Microalbumin, Urine: 18.8 ug/mL

## 2020-10-12 DIAGNOSIS — Z48298 Encounter for aftercare following other organ transplant: Secondary | ICD-10-CM | POA: Diagnosis not present

## 2020-10-15 ENCOUNTER — Telehealth: Payer: Self-pay

## 2020-10-15 NOTE — Telephone Encounter (Signed)
Patient name and DOB has been verified Patient was informed of lab results. Patient had no questions.  

## 2020-10-15 NOTE — Telephone Encounter (Signed)
-----   Message from Charlott Rakes, MD sent at 10/11/2020  5:53 PM EDT ----- Please inform him that his cholesterol is significantly elevated and it appears he was on an injection last year (Praluent) prescribed by his cardiologist for his cholesterol.  He needs to get back on this and needs to get in touch with his cardiologist at [facility] or Dr. Haroldine Laws to restart this.  Advised to adhere to low-cholesterol diet.  Other labs are stable.

## 2020-11-01 DIAGNOSIS — Z941 Heart transplant status: Secondary | ICD-10-CM | POA: Diagnosis not present

## 2020-11-01 DIAGNOSIS — Z48298 Encounter for aftercare following other organ transplant: Secondary | ICD-10-CM | POA: Diagnosis not present

## 2020-11-08 DIAGNOSIS — Z941 Heart transplant status: Secondary | ICD-10-CM | POA: Diagnosis not present

## 2020-11-08 DIAGNOSIS — Z48298 Encounter for aftercare following other organ transplant: Secondary | ICD-10-CM | POA: Diagnosis not present

## 2020-11-10 DIAGNOSIS — E119 Type 2 diabetes mellitus without complications: Secondary | ICD-10-CM | POA: Diagnosis not present

## 2020-11-10 DIAGNOSIS — Z23 Encounter for immunization: Secondary | ICD-10-CM | POA: Diagnosis not present

## 2020-11-10 DIAGNOSIS — Z79899 Other long term (current) drug therapy: Secondary | ICD-10-CM | POA: Diagnosis not present

## 2020-11-10 DIAGNOSIS — Z794 Long term (current) use of insulin: Secondary | ICD-10-CM | POA: Diagnosis not present

## 2020-11-15 DIAGNOSIS — Z48298 Encounter for aftercare following other organ transplant: Secondary | ICD-10-CM | POA: Diagnosis not present

## 2020-12-06 DIAGNOSIS — Z95810 Presence of automatic (implantable) cardiac defibrillator: Secondary | ICD-10-CM | POA: Insufficient documentation

## 2020-12-12 ENCOUNTER — Emergency Department (HOSPITAL_COMMUNITY): Payer: Medicare Other

## 2020-12-12 ENCOUNTER — Emergency Department (HOSPITAL_COMMUNITY)
Admission: EM | Admit: 2020-12-12 | Discharge: 2020-12-13 | Payer: Medicare Other | Attending: Emergency Medicine | Admitting: Emergency Medicine

## 2020-12-12 ENCOUNTER — Encounter (HOSPITAL_COMMUNITY): Payer: Self-pay | Admitting: *Deleted

## 2020-12-12 ENCOUNTER — Other Ambulatory Visit: Payer: Self-pay

## 2020-12-12 DIAGNOSIS — R7401 Elevation of levels of liver transaminase levels: Secondary | ICD-10-CM | POA: Diagnosis not present

## 2020-12-12 DIAGNOSIS — Z7982 Long term (current) use of aspirin: Secondary | ICD-10-CM | POA: Insufficient documentation

## 2020-12-12 DIAGNOSIS — R059 Cough, unspecified: Secondary | ICD-10-CM | POA: Diagnosis not present

## 2020-12-12 DIAGNOSIS — R319 Hematuria, unspecified: Secondary | ICD-10-CM | POA: Insufficient documentation

## 2020-12-12 DIAGNOSIS — Z20822 Contact with and (suspected) exposure to covid-19: Secondary | ICD-10-CM | POA: Diagnosis not present

## 2020-12-12 DIAGNOSIS — E1122 Type 2 diabetes mellitus with diabetic chronic kidney disease: Secondary | ICD-10-CM | POA: Diagnosis not present

## 2020-12-12 DIAGNOSIS — N183 Chronic kidney disease, stage 3 unspecified: Secondary | ICD-10-CM | POA: Insufficient documentation

## 2020-12-12 DIAGNOSIS — R0602 Shortness of breath: Secondary | ICD-10-CM | POA: Diagnosis not present

## 2020-12-12 DIAGNOSIS — I130 Hypertensive heart and chronic kidney disease with heart failure and stage 1 through stage 4 chronic kidney disease, or unspecified chronic kidney disease: Secondary | ICD-10-CM | POA: Insufficient documentation

## 2020-12-12 DIAGNOSIS — N179 Acute kidney failure, unspecified: Secondary | ICD-10-CM | POA: Insufficient documentation

## 2020-12-12 DIAGNOSIS — N261 Atrophy of kidney (terminal): Secondary | ICD-10-CM | POA: Diagnosis not present

## 2020-12-12 DIAGNOSIS — I700 Atherosclerosis of aorta: Secondary | ICD-10-CM | POA: Diagnosis not present

## 2020-12-12 DIAGNOSIS — Z79899 Other long term (current) drug therapy: Secondary | ICD-10-CM | POA: Insufficient documentation

## 2020-12-12 DIAGNOSIS — I2510 Atherosclerotic heart disease of native coronary artery without angina pectoris: Secondary | ICD-10-CM | POA: Diagnosis not present

## 2020-12-12 DIAGNOSIS — Z794 Long term (current) use of insulin: Secondary | ICD-10-CM | POA: Diagnosis not present

## 2020-12-12 DIAGNOSIS — I5022 Chronic systolic (congestive) heart failure: Secondary | ICD-10-CM | POA: Insufficient documentation

## 2020-12-12 DIAGNOSIS — N400 Enlarged prostate without lower urinary tract symptoms: Secondary | ICD-10-CM | POA: Diagnosis not present

## 2020-12-12 LAB — BRAIN NATRIURETIC PEPTIDE: B Natriuretic Peptide: >4500 pg/mL — ABNORMAL HIGH (ref 0.0–100.0)

## 2020-12-12 LAB — CBC WITH DIFFERENTIAL/PLATELET
Abs Immature Granulocytes: 0.05 10*3/uL (ref 0.00–0.07)
Basophils Absolute: 0 10*3/uL (ref 0.0–0.1)
Basophils Relative: 1 %
Eosinophils Absolute: 0.1 10*3/uL (ref 0.0–0.5)
Eosinophils Relative: 1 %
HCT: 39.2 % (ref 39.0–52.0)
Hemoglobin: 12.8 g/dL — ABNORMAL LOW (ref 13.0–17.0)
Immature Granulocytes: 1 %
Lymphocytes Relative: 11 %
Lymphs Abs: 0.9 10*3/uL (ref 0.7–4.0)
MCH: 33 pg (ref 26.0–34.0)
MCHC: 32.7 g/dL (ref 30.0–36.0)
MCV: 101 fL — ABNORMAL HIGH (ref 80.0–100.0)
Monocytes Absolute: 0.9 10*3/uL (ref 0.1–1.0)
Monocytes Relative: 12 %
Neutro Abs: 5.8 10*3/uL (ref 1.7–7.7)
Neutrophils Relative %: 74 %
Platelets: 316 10*3/uL (ref 150–400)
RBC: 3.88 MIL/uL — ABNORMAL LOW (ref 4.22–5.81)
RDW: 14.8 % (ref 11.5–15.5)
WBC: 7.8 10*3/uL (ref 4.0–10.5)
nRBC: 0.4 % — ABNORMAL HIGH (ref 0.0–0.2)

## 2020-12-12 LAB — ACETAMINOPHEN LEVEL: Acetaminophen (Tylenol), Serum: <10 ug/mL — ABNORMAL LOW (ref 10–30)

## 2020-12-12 LAB — LACTIC ACID, PLASMA: Lactic Acid, Venous: 4.7 mmol/L (ref 0.5–1.9)

## 2020-12-12 LAB — AMMONIA: Ammonia: 29 umol/L (ref 9–35)

## 2020-12-12 LAB — TROPONIN I (HIGH SENSITIVITY): Troponin I (High Sensitivity): 49 ng/L — ABNORMAL HIGH (ref <18)

## 2020-12-12 LAB — RESP PANEL BY RT-PCR (FLU A&B, COVID) ARPGX2
Influenza A by PCR: NEGATIVE
Influenza B by PCR: NEGATIVE
SARS Coronavirus 2 by RT PCR: NEGATIVE

## 2020-12-12 MED ORDER — SODIUM CHLORIDE 0.9 % IV SOLN: Freq: Once | INTRAVENOUS | Status: DC

## 2020-12-12 NOTE — ED Triage Notes (Signed)
Sob and coughing up some yellow mucous  for 2 days  no temp

## 2020-12-12 NOTE — ED Notes (Signed)
IV team at bedside 

## 2020-12-12 NOTE — ED Provider Notes (Signed)
Emergency Medicine Provider Triage Evaluation Note  Roark Rufo , a 55 y.o. male  was evaluated in triage.  Pt complains of shortness of breath, cough.  Heart/renal transplant 2022.  States he is compliant with his antirejection medications however does not member the name of this.  In epic, followed by [facility] on tacrolimus.  Worsening shortness of breath over last month however now has productive cough worse over last 2 days.  Occasional palpitations.  Heart rate here in 120s.  No new sick contacts.  No lower extremity swelling.  Review of Systems  Positive: Cough, shortness of breath, history of heart transplant Negative: Fever, emesis, urinary complaints, le edema  Physical Exam  BP 129/89 (BP Location: Left Arm)   Pulse (!) 122   Temp 97.6 F (36.4 C)   Resp 17   Ht 5\' 7"  (1.702 m)   Wt 63.8 kg   SpO2 95%   BMI 22.03 kg/m  Gen:   Awake, no distress   Cardiac: tachy Resp:  Normal effort  MSK:   Moves extremities without difficulty, no le edema Other:    Medical Decision Making  Medically screening exam initiated at 6:09 PM.  Appropriate orders placed.  Bradan Congrove was informed that the remainder of the evaluation will be completed by another provider, this initial triage assessment does not replace that evaluation, and the importance of remaining in the ED until their evaluation is complete.  Sob, cough, heart transplant  Needs room in back given hx and tachcyardia   Bowden Boody A, PA-C 12/12/20 1810    Wyvonnia Dusky, MD 12/12/20 2108

## 2020-12-12 NOTE — ED Notes (Signed)
Unable to get labs ?

## 2020-12-12 NOTE — ED Provider Notes (Signed)
[facility] EMERGENCY DEPARTMENT Provider Note   CSN: [id number] Arrival date & time: 12/12/20  1725     History Chief Complaint  Patient presents with   Shortness of Breath    Marvin Martin is a 55 y.o. male.  The history is provided by the patient and medical records.  Shortness of Breath Associated symptoms: cough    55 year old male with history of hypertension, diabetes, hyperlipidemia, CHF, presenting to the ED with several concerns.  Notably, complaining of shortness of breath.  States this started just before Thanksgiving and has been worsening over the past few days.  States he feels short of breath all the time, does seem a little worse with lying flat.  He has had cough intermittently productive of yellow mucus.  He denies any hemoptysis.  He does report poor appetite but has not had any vomiting or diarrhea.  States of the past 2 days he started noticing some hematuria.  Seems like bright red in color without any clots.  He has not had any difficulty urinating or discomfort with urination.  Also reports whites of his eyes started turning yellow 3 days ago.  He has never been told he had liver issues in the past.  No hx of hepatitis.  No heavy tylenol use.    Past Medical History:  Diagnosis Date   Chronic systolic CHF (congestive heart failure) ([place])    a. 01/2015 Echo: EF 20-25%, antlat, inflat AK.   CKD (chronic kidney disease), stage III (HCC)    Coronary artery disease    a. s/p MI x 4;  b. Reported h/o 17 stents with recurrent ISR;  c. 02/2014 Cath ([place]): PCI to unknown vessel;  d. 01/2015 MV: EF 18%, large scar, no ischemia; e. 03/2015 PCI: LM nl, LAD nl, D2 patent stent, RI patent stent, LCX patent stents p/m, RCA 95p ISR (PTCA), 56m, 100d into RPL CTO, RPDA 90 (2.5x16 Synergy DES).   Hyperlipidemia    Hypertensive heart disease    Ischemic cardiomyopathy    a. s/p SJM ICD 2007 w/ gen change in 2012; b. 12/2014 CPX Test: mild to mod HF limitation w/  pVO2 20.3 and nl slope;  c. 01/2015 Echo: EF 20-25%.    Patient Active Problem List   Diagnosis Date Noted   ICD (implantable cardioverter-defibrillator) in place - STJ 12/06/2020   DM (diabetes mellitus), secondary uncontrolled 07/29/2019   Hyponatremia 07/29/2019   Hypotension 07/29/2019   DKA (diabetic ketoacidoses) 07/29/2019   Drug-induced hyperglycemia 04/05/2019   Renal transplant, status post 04/05/2019   H/O heart transplant ([place]) 04/05/2019   Gout 01/07/2018   Unstable angina (HCC) 06/22/2015   Recurrent angina status post coronary stent placement ([place])    Acute kidney injury ([place])    Hypertensive heart disease 04/03/2015   Hyperlipidemia 04/03/2015   ST elevation (STEMI) myocardial infarction involving right coronary artery ([place]) 03/31/2015   Ischemic chest pain (HCC)    Cardiomyopathy, ischemic    CKD (chronic kidney disease) stage 3, GFR 30-59 ml/min (HCC) 29/51/8841   Chronic systolic heart failure ([place]) 01/07/2015   CAD in native artery 01/07/2015   Benign essential HTN 01/07/2015   CHF (congestive heart failure) ([place]) 12/11/2014    Past Surgical History:  Procedure Laterality Date   17 stints placements Bilateral    CARDIAC CATHETERIZATION     Most recent 02/2014    CARDIAC CATHETERIZATION N/A 03/31/2015   Procedure: Left Heart Cath and Coronary Angiography;  Surgeon: Troy Sine, MD;  Location: [facility];  Service: Cardiovascular;  Laterality: N/A;   CARDIAC CATHETERIZATION N/A 03/31/2015   Procedure: Coronary Stent Intervention;  Surgeon: Troy Sine, MD;  Location: [facility];  Service: Cardiovascular;  Laterality: N/A;   CARDIAC CATHETERIZATION N/A 05/27/2015   Procedure: Right/Left Heart Cath and Coronary Angiography;  Surgeon: Jolaine Artist, MD;  Location: [facility];  Service: Cardiovascular;  Laterality: N/A;   CARDIAC CATHETERIZATION N/A 06/23/2015   Procedure: Right/Left Heart Cath and Coronary Angiography;  Surgeon:  Jolaine Artist, MD;  Location: [facility];  Service: Cardiovascular;  Laterality: N/A;   HEART TRANSPLANT     INSERT / REPLACE / REMOVE PACEMAKER     St jude 2006  Generator Change 2012    NEPHRECTOMY TRANSPLANTED ORGAN     PACEMAKER GENERATOR CHANGE Bilateral        Family History  Problem Relation Age of Onset   Cancer Mother        died when pt was 53.  She had a h/o heart dzs as well.   Hypertension Mother    Hyperlipidemia Father        Father died when pt was age 78 - he believes that he had a cardiac hx.   CAD Sister     Social History   Tobacco Use   Smoking status: Never   Smokeless tobacco: Never  Vaping Use   Vaping Use: Never used  Substance Use Topics   Alcohol use: No    Alcohol/week: 0.0 standard drinks   Drug use: No    Home Medications Prior to Admission medications   Medication Sig Start Date End Date Taking? Authorizing Provider  aspirin 81 MG tablet Take 81 mg by mouth daily.    [provider]  Calcium Carbonate-Vitamin D 600-400 MG-UNIT tablet Take 1 tablet by mouth daily. 01/06/19 01/06/20  [provider]  carvedilol (COREG) 12.5 MG tablet Take 12.5 mg by mouth 2 (two) times daily. 03/23/19   [provider]  cholecalciferol (VITAMIN D) 1000 UNITS tablet Take 1,000 Units by mouth daily.    [provider]  Continuous Blood Gluc Receiver (FREESTYLE LIBRE 2 READER) DEVI 3 Units by Does not apply route in the morning, at noon, and at bedtime. 07/31/19   Georgette Shell, MD  Continuous Blood Gluc Sensor (FREESTYLE LIBRE 2 SENSOR) MISC 3 Units by Does not apply route in the morning, at noon, and at bedtime. 07/31/19   Georgette Shell, MD  diclofenac Sodium (VOLTAREN) 1 % GEL Apply 4 g topically 4 (four) times daily. 10/07/20   Charlott Rakes, MD  doxazosin (CARDURA) 2 MG tablet Take 2 mg by mouth at bedtime. 03/11/19   [provider]  ezetimibe (ZETIA) 10 MG tablet Take 1 tablet (10 mg total)  by mouth daily. Needs appt for further refills 05/27/19   Bensimhon, Shaune Pascal, MD  HYDROcodone-acetaminophen (NORCO/VICODIN) 5-325 MG tablet Take 1 tablet by mouth every 4 (four) hours as needed. Patient not taking: Reported on 02/25/2020 12/16/17   Recardo Evangelist, PA-C  insulin aspart (NOVOLOG FLEXPEN) 100 UNIT/ML FlexPen Inject 8 Units into the skin 3 (three) times daily with meals. Per sliding scale 03/11/20   Charlott Rakes, MD  insulin glargine (LANTUS SOLOSTAR) 100 UNIT/ML Solostar Pen Inject 20 Units into the skin daily. 03/11/20   Charlott Rakes, MD  Insulin Pen Needle 32G X 4 MM MISC 1 application by Does not apply route 4 (four)  times daily. 04/07/19   Guilford Shi, MD  isosorbide mononitrate (IMDUR) 30 MG 24 hr tablet TAKE 1 TABLET BY MOUTH EVERY DAY 08/13/19   Bensimhon, Shaune Pascal, MD  lidocaine (LIDODERM) 5 % Place 1 patch onto the skin daily. Remove & Discard patch within 12 hours or as directed by MD 09/16/20   Suella Broad A, PA-C  magnesium oxide (MAG-OX) 400 (241.3 Mg) MG tablet Take 400 mg by mouth 2 (two) times daily.  03/11/19   [provider]  nitroGLYCERIN (NITROSTAT) 0.4 MG SL tablet Place 1 tablet (0.4 mg total) under the tongue every 5 (five) minutes as needed for chest pain. Reported on 04/12/2015 06/03/15   Charlott Rakes, MD  nystatin (MYCOSTATIN) 100000 UNIT/ML suspension Take 5 mLs by mouth See admin instructions. Swish and swallow 5 ml's by mouth four times a day 02/21/19   [provider]  omega-3 acid ethyl esters (LOVAZA) 1 G capsule Take 1 g by mouth daily.     [provider]  ondansetron (ZOFRAN) 4 MG tablet Take 1 tablet (4 mg total) every 8 (eight) hours as needed by mouth for nausea or vomiting. Reported on 05/24/2015 11/20/16   Marvin Grinder D, NP  oxyCODONE-acetaminophen (PERCOCET/ROXICET) 5-325 MG tablet Take 1 tablet by mouth every 6 (six) hours as needed for severe pain. 09/07/20   Venter, Margaux, PA-C  PRALUENT 75 MG/ML SOAJ INJECT 75 MG  INTO THE SKIN EVERY 14 (FOURTEEN) DAYS. 05/01/19   Bensimhon, Shaune Pascal, MD  predniSONE (DELTASONE) 5 MG tablet Take 1 tablet by mouth in the morning.  06/18/19   [provider]  Semaglutide, 1 MG/DOSE, (OZEMPIC, 1 MG/DOSE,) 2 MG/1.5ML SOPN Inject 1 mg into the skin once a week. 03/11/20   Charlott Rakes, MD    Allergies    Patient has no known allergies.  Review of Systems   Review of Systems  Respiratory:  Positive for cough and shortness of breath.   Genitourinary:  Positive for hematuria.  All other systems reviewed and are negative.  Physical Exam Updated Vital Signs BP 117/90   Pulse (!) 119   Temp 97.9 F (36.6 C) (Oral)   Resp 15   Ht 5\' 7"  (1.702 m)   Wt 63.8 kg   SpO2 94%   BMI 22.03 kg/m   Physical Exam Vitals and nursing note reviewed.  Constitutional:      Appearance: He is well-developed.  HENT:     Head: Normocephalic and atraumatic.  Eyes:     Conjunctiva/sclera: Conjunctivae normal.     Pupils: Pupils are equal, round, and reactive to light.     Comments: Scleral icterus  Cardiovascular:     Rate and Rhythm: Normal rate and regular rhythm.     Heart sounds: Normal heart sounds.  Pulmonary:     Effort: Pulmonary effort is normal. No respiratory distress.     Breath sounds: Normal breath sounds. No rhonchi.  Abdominal:     General: Bowel sounds are normal.     Palpations: Abdomen is soft.     Tenderness: There is no abdominal tenderness. There is no rebound.  Musculoskeletal:        General: Normal range of motion.     Cervical back: Normal range of motion.     Comments: No peripheral edema  Skin:    General: Skin is warm and dry.  Neurological:     Mental Status: He is alert and oriented to person, place, and time.    ED  Results / Procedures / Treatments   Labs (all labs ordered are listed, but only abnormal results are displayed) Labs Reviewed  CBC WITH DIFFERENTIAL/PLATELET - Abnormal; Notable for the following components:       Result Value   RBC 3.88 (*)    Hemoglobin 12.8 (*)    MCV 101.0 (*)    nRBC 0.4 (*)    All other components within normal limits  COMPREHENSIVE METABOLIC PANEL - Abnormal; Notable for the following components:   Potassium 5.2 (*)    CO2 11 (*)    Glucose, Bld 209 (*)    BUN 143 (*)    Creatinine, Ser 6.15 (*)    AST 358 (*)    ALT 527 (*)    Alkaline Phosphatase 408 (*)    Total Bilirubin 2.2 (*)    GFR, Estimated 10 (*)    Anion gap 21 (*)    All other components within normal limits  BRAIN NATRIURETIC PEPTIDE - Abnormal; Notable for the following components:   B Natriuretic Peptide >4,500.0 (*)    All other components within normal limits  LACTIC ACID, PLASMA - Abnormal; Notable for the following components:   Lactic Acid, Venous 4.7 (*)    All other components within normal limits  ACETAMINOPHEN LEVEL - Abnormal; Notable for the following components:   Acetaminophen (Tylenol), Serum <10 (*)    All other components within normal limits  TROPONIN I (HIGH SENSITIVITY) - Abnormal; Notable for the following components:   Troponin I (High Sensitivity) 49 (*)    All other components within normal limits  TROPONIN I (HIGH SENSITIVITY) - Abnormal; Notable for the following components:   Troponin I (High Sensitivity) 55 (*)    All other components within normal limits  RESP PANEL BY RT-PCR (FLU A&B, COVID) ARPGX2  CULTURE, BLOOD (SINGLE)  URINE CULTURE  AMMONIA  LACTIC ACID, PLASMA  TACROLIMUS LEVEL  URINALYSIS, ROUTINE W REFLEX MICROSCOPIC    EKG None  Radiology DG Chest 2 View  Result Date: 12/12/2020 CLINICAL DATA:  Shortness of breath, cough, prior heart and renal transplants in 2022 compliant on anti-rejection medications, worsening shortness of breath over past month now with productive cough EXAM: CHEST - 2 VIEW COMPARISON:  07/29/2019 FINDINGS: Enlargement of cardiac silhouette post median sternotomy and orthotopic cardiac transplantation. Mediastinal contours and  pulmonary vascularity normal. Small RIGHT pleural effusion with atelectasis versus consolidation in medial RIGHT lower lobe, new. Remaining lungs clear. No pneumothorax or acute osseous findings. IMPRESSION: Enlargement of cardiac silhouette post cardiac transplant. New RIGHT pleural effusion and atelectasis versus consolidation of RIGHT lower lobe. Electronically Signed   By: Lavonia Dana M.D.   On: 12/12/2020 18:51   CT Renal Stone Study  Result Date: 12/13/2020 CLINICAL DATA:  Hematuria. Status post renal transplant with new ARF today. EXAM: CT ABDOMEN AND PELVIS WITHOUT CONTRAST TECHNIQUE: Multidetector CT imaging of the abdomen and pelvis was performed following the standard protocol without IV contrast. COMPARISON:  07/25/2015 FINDINGS: Lower chest: The heart is enlarged. There is a small to moderate right pleural effusion with atelectasis at the right lung base. Hepatobiliary: No focal liver abnormality is seen. No gallstones, gallbladder wall thickening, or biliary dilatation. Pancreas: Unremarkable. No pancreatic ductal dilatation or surrounding inflammatory changes. Spleen: Normal in size without focal abnormality. Adrenals/Urinary Tract: No adrenal nodule or mass. There is atrophy of the native kidneys bilaterally. No renal calculus or hydronephrosis. A renal transplant is noted in the right lower quadrant. There is mild fullness of  the right renal pelvis of the transplant kidney. Mild fat stranding is present around the transplanted kidney. The bladder is unremarkable. Stomach/Bowel: Stomach is within normal limits. The appendix is not visualized on exam. No evidence of bowel wall thickening, distention, or inflammatory changes. Vascular/Lymphatic: Aortic atherosclerosis. No enlarged abdominal or pelvic lymph nodes. Reproductive: The prostate gland is enlarged. Other: A trace amount of free fluid is noted in the pelvis. Musculoskeletal: Sternotomy wires are present over the midline. There are  degenerative changes in the thoracolumbar spine. No acute or suspicious osseous abnormality. IMPRESSION: 1. Atrophy of the native kidneys. A renal transplant is noted in the right lower quadrant. There is mild fullness of the right renal pelvis and mild perinephric fat stranding is noted about the transplant in the right lower quadrant. Ultrasound is recommended for further evaluation. 2. Small to moderate right pleural effusion with atelectasis at the right lung base. 3. Cardiomegaly. 4. Trace amount of free fluid in the pelvis. 5. Enlarged prostate gland. Electronically Signed   By: Brett Fairy M.D.   On: 12/13/2020 00:25    Procedures Procedures   CRITICAL CARE Performed by: Larene Pickett   Total critical care time: 45 minutes  Critical care time was exclusive of separately billable procedures and treating other patients.  Critical care was necessary to treat or prevent imminent or life-threatening deterioration.  Critical care was time spent personally by me on the following activities: development of treatment plan with patient and/or surrogate as well as nursing, discussions with consultants, evaluation of patient's response to treatment, examination of patient, obtaining history from patient or surrogate, ordering and performing treatments and interventions, ordering and review of laboratory studies, ordering and review of radiographic studies, pulse oximetry and re-evaluation of patient's condition.   Medications Ordered in ED Medications  furosemide (LASIX) injection 40 mg (40 mg Intravenous Given 12/13/20 0141)    ED Course  I have reviewed the triage vital signs and the nursing notes.  Pertinent labs & imaging results that were available during my care of the patient were reviewed by me and considered in my medical decision making (see chart for details).    MDM Rules/Calculators/A&P                           55 year old male presenting to the ED with shortness of  breath.  States he is felt this way since before Thanksgiving but worsening.  Also reports new hematuria and yellowing of the eyes.  He is status post heart and renal transplant at [facility] in 2020.  States he had recent follow-up and all of his tests were reassuring.  He is afebrile and nontoxic but is tachycardic and somewhat tachypneic.  His oxygen saturations are normal on room air and states he feels comfortable at rest.  He does not have any peripheral edema or other signs of fluid overload currently.  He does have scleral icterus on exam.  He has no history of hepatitis or other liver pathology.  Labs as above-- now has findings concerning for hepatorenal failure with elevated LFT's, SrCr of 6.15 (baseline s/p transplant is around 1.2-1.5).  trop 49 but suspect this is due to renal impairment.  Lactate 4.7-- clinically no signs of infection at present with no fever and normal WBC count.  Suspect this is related to his renal failure.  Given his new hematuria, will check CT renal stone study to ensure no obstructing pathology causing renal failure.  Likely will need transfer to [place].  CT negative for acute obstructive process-- does show some inflammation around transplanted kidney on right.  No findings about liver or gallbladder, no stones.  UA pending.  With findings, concerning for hepatorenal failure.  Discussed with transfer line at [facility], awaiting callback for transplant team.  3:25 AM Spoke with Dr. Augustin Coupe at [facility]-- will plan for admission to transplant team service under care of Dr. Dorien Chihuahua.    3:25 AM [facility] transport here for patient. EMTALA completed.  VSS at time of transfer.  Final Clinical Impression(s) / ED Diagnoses Final diagnoses:  Acute renal failure, unspecified acute renal failure type [facility])  Transaminitis    Rx / DC Orders ED Discharge Orders     None        Larene Pickett, PA-C 12/13/20 0326    Luna Fuse, MD 12/15/20 1440

## 2020-12-13 ENCOUNTER — Emergency Department (HOSPITAL_COMMUNITY): Payer: Medicare Other

## 2020-12-13 ENCOUNTER — Other Ambulatory Visit (HOSPITAL_COMMUNITY): Payer: Medicare Other

## 2020-12-13 ENCOUNTER — Encounter: Payer: Medicare Other | Admitting: Internal Medicine

## 2020-12-13 DIAGNOSIS — I5022 Chronic systolic (congestive) heart failure: Secondary | ICD-10-CM | POA: Diagnosis not present

## 2020-12-13 DIAGNOSIS — Z79899 Other long term (current) drug therapy: Secondary | ICD-10-CM | POA: Diagnosis not present

## 2020-12-13 DIAGNOSIS — T8621 Heart transplant rejection: Secondary | ICD-10-CM | POA: Diagnosis present

## 2020-12-13 DIAGNOSIS — R0602 Shortness of breath: Secondary | ICD-10-CM | POA: Diagnosis not present

## 2020-12-13 DIAGNOSIS — E1110 Type 2 diabetes mellitus with ketoacidosis without coma: Secondary | ICD-10-CM | POA: Diagnosis not present

## 2020-12-13 DIAGNOSIS — Z95810 Presence of automatic (implantable) cardiac defibrillator: Secondary | ICD-10-CM

## 2020-12-13 DIAGNOSIS — N189 Chronic kidney disease, unspecified: Secondary | ICD-10-CM | POA: Diagnosis not present

## 2020-12-13 DIAGNOSIS — I2510 Atherosclerotic heart disease of native coronary artery without angina pectoris: Secondary | ICD-10-CM | POA: Diagnosis present

## 2020-12-13 DIAGNOSIS — Z941 Heart transplant status: Secondary | ICD-10-CM | POA: Diagnosis not present

## 2020-12-13 DIAGNOSIS — Z48298 Encounter for aftercare following other organ transplant: Secondary | ICD-10-CM | POA: Diagnosis not present

## 2020-12-13 DIAGNOSIS — I517 Cardiomegaly: Secondary | ICD-10-CM | POA: Diagnosis not present

## 2020-12-13 DIAGNOSIS — E119 Type 2 diabetes mellitus without complications: Secondary | ICD-10-CM | POA: Diagnosis not present

## 2020-12-13 DIAGNOSIS — D72819 Decreased white blood cell count, unspecified: Secondary | ICD-10-CM | POA: Diagnosis not present

## 2020-12-13 DIAGNOSIS — J90 Pleural effusion, not elsewhere classified: Secondary | ICD-10-CM | POA: Diagnosis not present

## 2020-12-13 DIAGNOSIS — Z9889 Other specified postprocedural states: Secondary | ICD-10-CM | POA: Diagnosis not present

## 2020-12-13 DIAGNOSIS — I255 Ischemic cardiomyopathy: Secondary | ICD-10-CM | POA: Diagnosis not present

## 2020-12-13 DIAGNOSIS — Z20822 Contact with and (suspected) exposure to covid-19: Secondary | ICD-10-CM | POA: Diagnosis present

## 2020-12-13 DIAGNOSIS — D649 Anemia, unspecified: Secondary | ICD-10-CM | POA: Diagnosis not present

## 2020-12-13 DIAGNOSIS — E872 Acidosis, unspecified: Secondary | ICD-10-CM | POA: Diagnosis not present

## 2020-12-13 DIAGNOSIS — T8619 Other complication of kidney transplant: Secondary | ICD-10-CM | POA: Diagnosis present

## 2020-12-13 DIAGNOSIS — R570 Cardiogenic shock: Secondary | ICD-10-CM | POA: Diagnosis present

## 2020-12-13 DIAGNOSIS — R1312 Dysphagia, oropharyngeal phase: Secondary | ICD-10-CM | POA: Diagnosis not present

## 2020-12-13 DIAGNOSIS — E1159 Type 2 diabetes mellitus with other circulatory complications: Secondary | ICD-10-CM | POA: Diagnosis present

## 2020-12-13 DIAGNOSIS — Z940 Kidney transplant status: Secondary | ICD-10-CM | POA: Diagnosis not present

## 2020-12-13 DIAGNOSIS — Z452 Encounter for adjustment and management of vascular access device: Secondary | ICD-10-CM | POA: Diagnosis not present

## 2020-12-13 DIAGNOSIS — I10 Essential (primary) hypertension: Secondary | ICD-10-CM | POA: Diagnosis not present

## 2020-12-13 DIAGNOSIS — I700 Atherosclerosis of aorta: Secondary | ICD-10-CM | POA: Diagnosis not present

## 2020-12-13 DIAGNOSIS — Z4682 Encounter for fitting and adjustment of non-vascular catheter: Secondary | ICD-10-CM | POA: Diagnosis not present

## 2020-12-13 DIAGNOSIS — B3781 Candidal esophagitis: Secondary | ICD-10-CM | POA: Diagnosis not present

## 2020-12-13 DIAGNOSIS — J811 Chronic pulmonary edema: Secondary | ICD-10-CM | POA: Diagnosis not present

## 2020-12-13 DIAGNOSIS — E1101 Type 2 diabetes mellitus with hyperosmolarity with coma: Secondary | ICD-10-CM | POA: Diagnosis present

## 2020-12-13 DIAGNOSIS — E1122 Type 2 diabetes mellitus with diabetic chronic kidney disease: Secondary | ICD-10-CM | POA: Diagnosis present

## 2020-12-13 DIAGNOSIS — I82411 Acute embolism and thrombosis of right femoral vein: Secondary | ICD-10-CM | POA: Diagnosis not present

## 2020-12-13 DIAGNOSIS — E875 Hyperkalemia: Secondary | ICD-10-CM | POA: Diagnosis present

## 2020-12-13 DIAGNOSIS — Z9114 Patient's other noncompliance with medication regimen: Secondary | ICD-10-CM | POA: Diagnosis not present

## 2020-12-13 DIAGNOSIS — D899 Disorder involving the immune mechanism, unspecified: Secondary | ICD-10-CM | POA: Diagnosis not present

## 2020-12-13 DIAGNOSIS — I130 Hypertensive heart and chronic kidney disease with heart failure and stage 1 through stage 4 chronic kidney disease, or unspecified chronic kidney disease: Secondary | ICD-10-CM | POA: Diagnosis present

## 2020-12-13 DIAGNOSIS — I5082 Biventricular heart failure: Secondary | ICD-10-CM | POA: Diagnosis present

## 2020-12-13 DIAGNOSIS — K761 Chronic passive congestion of liver: Secondary | ICD-10-CM | POA: Diagnosis present

## 2020-12-13 DIAGNOSIS — R319 Hematuria, unspecified: Secondary | ICD-10-CM | POA: Diagnosis not present

## 2020-12-13 DIAGNOSIS — J9811 Atelectasis: Secondary | ICD-10-CM | POA: Diagnosis not present

## 2020-12-13 DIAGNOSIS — T8611 Kidney transplant rejection: Secondary | ICD-10-CM | POA: Diagnosis present

## 2020-12-13 DIAGNOSIS — N400 Enlarged prostate without lower urinary tract symptoms: Secondary | ICD-10-CM | POA: Diagnosis not present

## 2020-12-13 DIAGNOSIS — Z794 Long term (current) use of insulin: Secondary | ICD-10-CM | POA: Diagnosis not present

## 2020-12-13 DIAGNOSIS — E1165 Type 2 diabetes mellitus with hyperglycemia: Secondary | ICD-10-CM | POA: Diagnosis not present

## 2020-12-13 DIAGNOSIS — E785 Hyperlipidemia, unspecified: Secondary | ICD-10-CM | POA: Diagnosis present

## 2020-12-13 DIAGNOSIS — N261 Atrophy of kidney (terminal): Secondary | ICD-10-CM | POA: Diagnosis not present

## 2020-12-13 DIAGNOSIS — R7401 Elevation of levels of liver transaminase levels: Secondary | ICD-10-CM | POA: Diagnosis not present

## 2020-12-13 DIAGNOSIS — N179 Acute kidney failure, unspecified: Secondary | ICD-10-CM | POA: Diagnosis present

## 2020-12-13 DIAGNOSIS — I5023 Acute on chronic systolic (congestive) heart failure: Secondary | ICD-10-CM | POA: Diagnosis present

## 2020-12-13 DIAGNOSIS — D84821 Immunodeficiency due to drugs: Secondary | ICD-10-CM | POA: Diagnosis present

## 2020-12-13 DIAGNOSIS — N183 Chronic kidney disease, stage 3 unspecified: Secondary | ICD-10-CM | POA: Diagnosis not present

## 2020-12-13 LAB — COMPREHENSIVE METABOLIC PANEL
ALT: 527 U/L — ABNORMAL HIGH (ref 0–44)
AST: 358 U/L — ABNORMAL HIGH (ref 15–41)
Albumin: 3.7 g/dL (ref 3.5–5.0)
Alkaline Phosphatase: 408 U/L — ABNORMAL HIGH (ref 38–126)
Anion gap: 21 — ABNORMAL HIGH (ref 5–15)
BUN: 143 mg/dL — ABNORMAL HIGH (ref 6–20)
CO2: 11 mmol/L — ABNORMAL LOW (ref 22–32)
Calcium: 9.6 mg/dL (ref 8.9–10.3)
Chloride: 108 mmol/L (ref 98–111)
Creatinine, Ser: 6.15 mg/dL — ABNORMAL HIGH (ref 0.61–1.24)
GFR, Estimated: 10 mL/min — ABNORMAL LOW (ref >60)
Glucose, Bld: 209 mg/dL — ABNORMAL HIGH (ref 70–99)
Potassium: 5.2 mmol/L — ABNORMAL HIGH (ref 3.5–5.1)
Sodium: 140 mmol/L (ref 135–145)
Total Bilirubin: 2.2 mg/dL — ABNORMAL HIGH (ref 0.3–1.2)
Total Protein: 7.9 g/dL (ref 6.5–8.1)

## 2020-12-13 LAB — TROPONIN I (HIGH SENSITIVITY): Troponin I (High Sensitivity): 55 ng/L — ABNORMAL HIGH (ref <18)

## 2020-12-13 MED ORDER — FUROSEMIDE 10 MG/ML IJ SOLN
40.0000 mg | INTRAMUSCULAR | Status: AC
  Administered 2020-12-13: 02:00:00 40 mg via INTRAVENOUS
  Filled 2020-12-13: qty 4

## 2020-12-13 NOTE — ED Notes (Signed)
[facility] at bedside to transport patient. Report given. Receiving nurse called to notify that patient is en route.

## 2020-12-14 LAB — TACROLIMUS LEVEL: Tacrolimus (FK506) - LabCorp: 1.1 ng/mL — ABNORMAL LOW (ref 2.0–20.0)

## 2020-12-17 LAB — CULTURE, BLOOD (SINGLE): Culture: NO GROWTH

## 2020-12-29 ENCOUNTER — Encounter: Payer: Self-pay | Admitting: Internal Medicine

## 2020-12-31 DIAGNOSIS — Z792 Long term (current) use of antibiotics: Secondary | ICD-10-CM | POA: Diagnosis not present

## 2020-12-31 DIAGNOSIS — Z4821 Encounter for aftercare following heart transplant: Secondary | ICD-10-CM | POA: Diagnosis not present

## 2020-12-31 DIAGNOSIS — Z941 Heart transplant status: Secondary | ICD-10-CM | POA: Diagnosis not present

## 2020-12-31 DIAGNOSIS — T8611 Kidney transplant rejection: Secondary | ICD-10-CM | POA: Diagnosis not present

## 2020-12-31 DIAGNOSIS — I130 Hypertensive heart and chronic kidney disease with heart failure and stage 1 through stage 4 chronic kidney disease, or unspecified chronic kidney disease: Secondary | ICD-10-CM | POA: Diagnosis not present

## 2020-12-31 DIAGNOSIS — E1122 Type 2 diabetes mellitus with diabetic chronic kidney disease: Secondary | ICD-10-CM | POA: Diagnosis not present

## 2020-12-31 DIAGNOSIS — Z48298 Encounter for aftercare following other organ transplant: Secondary | ICD-10-CM | POA: Diagnosis not present

## 2020-12-31 DIAGNOSIS — N189 Chronic kidney disease, unspecified: Secondary | ICD-10-CM | POA: Diagnosis not present

## 2020-12-31 DIAGNOSIS — Z298 Encounter for other specified prophylactic measures: Secondary | ICD-10-CM | POA: Diagnosis not present

## 2020-12-31 DIAGNOSIS — Z940 Kidney transplant status: Secondary | ICD-10-CM | POA: Diagnosis not present

## 2020-12-31 DIAGNOSIS — Z125 Encounter for screening for malignant neoplasm of prostate: Secondary | ICD-10-CM | POA: Diagnosis not present

## 2020-12-31 DIAGNOSIS — B3781 Candidal esophagitis: Secondary | ICD-10-CM | POA: Diagnosis not present

## 2020-12-31 DIAGNOSIS — I5020 Unspecified systolic (congestive) heart failure: Secondary | ICD-10-CM | POA: Diagnosis not present

## 2020-12-31 DIAGNOSIS — Z79899 Other long term (current) drug therapy: Secondary | ICD-10-CM | POA: Diagnosis not present

## 2020-12-31 DIAGNOSIS — D84821 Immunodeficiency due to drugs: Secondary | ICD-10-CM | POA: Diagnosis not present

## 2021-01-05 DIAGNOSIS — R112 Nausea with vomiting, unspecified: Secondary | ICD-10-CM | POA: Diagnosis not present

## 2021-01-05 DIAGNOSIS — B370 Candidal stomatitis: Secondary | ICD-10-CM | POA: Diagnosis present

## 2021-01-05 DIAGNOSIS — R197 Diarrhea, unspecified: Secondary | ICD-10-CM | POA: Diagnosis present

## 2021-01-05 DIAGNOSIS — Z45018 Encounter for adjustment and management of other part of cardiac pacemaker: Secondary | ICD-10-CM | POA: Diagnosis not present

## 2021-01-05 DIAGNOSIS — D84821 Immunodeficiency due to drugs: Secondary | ICD-10-CM | POA: Diagnosis present

## 2021-01-05 DIAGNOSIS — E1122 Type 2 diabetes mellitus with diabetic chronic kidney disease: Secondary | ICD-10-CM | POA: Diagnosis present

## 2021-01-05 DIAGNOSIS — E1159 Type 2 diabetes mellitus with other circulatory complications: Secondary | ICD-10-CM | POA: Diagnosis present

## 2021-01-05 DIAGNOSIS — Z794 Long term (current) use of insulin: Secondary | ICD-10-CM | POA: Diagnosis not present

## 2021-01-05 DIAGNOSIS — N189 Chronic kidney disease, unspecified: Secondary | ICD-10-CM | POA: Diagnosis present

## 2021-01-05 DIAGNOSIS — Z941 Heart transplant status: Secondary | ICD-10-CM | POA: Diagnosis not present

## 2021-01-05 DIAGNOSIS — T82858A Stenosis of vascular prosthetic devices, implants and grafts, initial encounter: Secondary | ICD-10-CM | POA: Diagnosis present

## 2021-01-05 DIAGNOSIS — E872 Acidosis, unspecified: Secondary | ICD-10-CM | POA: Diagnosis not present

## 2021-01-05 DIAGNOSIS — N170 Acute kidney failure with tubular necrosis: Secondary | ICD-10-CM | POA: Diagnosis present

## 2021-01-05 DIAGNOSIS — I5023 Acute on chronic systolic (congestive) heart failure: Secondary | ICD-10-CM | POA: Diagnosis present

## 2021-01-05 DIAGNOSIS — Z298 Encounter for other specified prophylactic measures: Secondary | ICD-10-CM | POA: Diagnosis not present

## 2021-01-05 DIAGNOSIS — Z4821 Encounter for aftercare following heart transplant: Secondary | ICD-10-CM | POA: Diagnosis not present

## 2021-01-05 DIAGNOSIS — Z5309 Procedure and treatment not carried out because of other contraindication: Secondary | ICD-10-CM | POA: Diagnosis present

## 2021-01-05 DIAGNOSIS — B3781 Candidal esophagitis: Secondary | ICD-10-CM | POA: Diagnosis present

## 2021-01-05 DIAGNOSIS — R000 Tachycardia, unspecified: Secondary | ICD-10-CM | POA: Diagnosis present

## 2021-01-05 DIAGNOSIS — I5A Non-ischemic myocardial injury (non-traumatic): Secondary | ICD-10-CM | POA: Diagnosis present

## 2021-01-05 DIAGNOSIS — I130 Hypertensive heart and chronic kidney disease with heart failure and stage 1 through stage 4 chronic kidney disease, or unspecified chronic kidney disease: Secondary | ICD-10-CM | POA: Diagnosis present

## 2021-01-05 DIAGNOSIS — Z48298 Encounter for aftercare following other organ transplant: Secondary | ICD-10-CM | POA: Diagnosis not present

## 2021-01-05 DIAGNOSIS — E119 Type 2 diabetes mellitus without complications: Secondary | ICD-10-CM | POA: Diagnosis not present

## 2021-01-05 DIAGNOSIS — T8611 Kidney transplant rejection: Secondary | ICD-10-CM | POA: Diagnosis present

## 2021-01-05 DIAGNOSIS — Z940 Kidney transplant status: Secondary | ICD-10-CM | POA: Diagnosis not present

## 2021-01-05 DIAGNOSIS — R34 Anuria and oliguria: Secondary | ICD-10-CM | POA: Diagnosis not present

## 2021-01-05 DIAGNOSIS — I5020 Unspecified systolic (congestive) heart failure: Secondary | ICD-10-CM | POA: Diagnosis not present

## 2021-01-05 DIAGNOSIS — N179 Acute kidney failure, unspecified: Secondary | ICD-10-CM | POA: Diagnosis not present

## 2021-01-05 DIAGNOSIS — Z9889 Other specified postprocedural states: Secondary | ICD-10-CM | POA: Diagnosis not present

## 2021-01-05 DIAGNOSIS — Z79899 Other long term (current) drug therapy: Secondary | ICD-10-CM | POA: Diagnosis not present

## 2021-01-05 DIAGNOSIS — E875 Hyperkalemia: Secondary | ICD-10-CM | POA: Diagnosis present

## 2021-01-05 DIAGNOSIS — D849 Immunodeficiency, unspecified: Secondary | ICD-10-CM | POA: Diagnosis not present

## 2021-01-05 DIAGNOSIS — T8621 Heart transplant rejection: Secondary | ICD-10-CM | POA: Diagnosis present

## 2021-01-05 DIAGNOSIS — Z452 Encounter for adjustment and management of vascular access device: Secondary | ICD-10-CM | POA: Diagnosis not present

## 2021-01-05 DIAGNOSIS — E785 Hyperlipidemia, unspecified: Secondary | ICD-10-CM | POA: Diagnosis present

## 2021-01-05 DIAGNOSIS — D649 Anemia, unspecified: Secondary | ICD-10-CM | POA: Diagnosis present

## 2021-01-05 DIAGNOSIS — Z20822 Contact with and (suspected) exposure to covid-19: Secondary | ICD-10-CM | POA: Diagnosis present

## 2021-01-05 DIAGNOSIS — R570 Cardiogenic shock: Secondary | ICD-10-CM | POA: Diagnosis present

## 2021-01-05 DIAGNOSIS — J90 Pleural effusion, not elsewhere classified: Secondary | ICD-10-CM | POA: Diagnosis not present

## 2021-03-21 ENCOUNTER — Telehealth: Payer: Self-pay | Admitting: Family Medicine

## 2021-03-21 NOTE — Telephone Encounter (Signed)
Patient has been called and set an appointment for 3/0/23 AWV. ?

## 2021-03-21 NOTE — Telephone Encounter (Signed)
Copied from [place] [id number]. Topic: General - Other >> Mar 21, 2021 12:59 PM Alanda Slim E wrote: Reason for CRM: Pt stated he received a call and stated it was about medicare / please advise

## 2021-03-25 ENCOUNTER — Ambulatory Visit: Payer: Medicare Other | Attending: Family Medicine | Admitting: *Deleted

## 2021-03-25 ENCOUNTER — Other Ambulatory Visit: Payer: Self-pay

## 2021-03-25 DIAGNOSIS — Z0000 Encounter for general adult medical examination without abnormal findings: Secondary | ICD-10-CM

## 2021-03-25 NOTE — Patient Instructions (Signed)
Diabetes Mellitus and Foot Care Foot care is an important part of your health, especially when you have diabetes. Diabetes may cause you to have problems because of poor blood flow (circulation) to your feet and legs, which can cause your skin to: Become thinner and drier. Break more easily. Heal more slowly. Peel and crack. You may also have nerve damage (neuropathy) in your legs and feet, causing decreased feeling in them. This means that you may not notice minor injuries to your feet that could lead to more serious problems. Noticing and addressing any potential problems early is the best way to prevent future foot problems. How to care for your feet Foot hygiene  Wash your feet daily with warm water and mild soap. Do not use hot water. Then, pat your feet and the areas between your toes until they are completely dry. Do not soak your feet as this can dry your skin. Trim your toenails straight across. Do not dig under them or around the cuticle. File the edges of your nails with an emery board or nail file. Apply a moisturizing lotion or petroleum jelly to the skin on your feet and to dry, brittle toenails. Use lotion that does not contain alcohol and is unscented. Do not apply lotion between your toes. Shoes and socks Wear clean socks or stockings every day. Make sure they are not too tight. Do not wear knee-high stockings since they may decrease blood flow to your legs. Wear shoes that fit properly and have enough cushioning. Always look in your shoes before you put them on to be sure there are no objects inside. To break in new shoes, wear them for just a few hours a day. This prevents injuries on your feet. Wounds, scrapes, corns, and calluses  Check your feet daily for blisters, cuts, bruises, sores, and redness. If you cannot see the bottom of your feet, use a mirror or ask someone for help. Do not cut corns or calluses or try to remove them with medicine. If you find a minor scrape,  cut, or break in the skin on your feet, keep it and the skin around it clean and dry. You may clean these areas with mild soap and water. Do not clean the area with peroxide, alcohol, or iodine. If you have a wound, scrape, corn, or callus on your foot, look at it several times a day to make sure it is healing and not infected. Check for: Redness, swelling, or pain. Fluid or blood. Warmth. Pus or a bad smell. General tips Do not cross your legs. This may decrease blood flow to your feet. Do not use heating pads or hot water bottles on your feet. They may burn your skin. If you have lost feeling in your feet or legs, you may not know this is happening until it is too late. Protect your feet from hot and cold by wearing shoes, such as at the beach or on hot pavement. Schedule a complete foot exam at least once a year (annually) or more often if you have foot problems. Report any cuts, sores, or bruises to your health care provider immediately. Where to find more information American Diabetes Association: www.diabetes.org Association of [employer] Education Specialists: www.diabeteseducator.org Contact a health care provider if: You have a medical condition that increases your risk of infection and you have any cuts, sores, or bruises on your feet. You have an injury that is not healing. You have redness on your legs or feet. You  feel burning or tingling in your legs or feet. You have pain or cramps in your legs and feet. Your legs or feet are numb. Your feet always feel cold. You have pain around any toenails. Get help right away if: You have a wound, scrape, corn, or callus on your foot and: You have pain, swelling, or redness that gets worse. You have fluid or blood coming from the wound, scrape, corn, or callus. Your wound, scrape, corn, or callus feels warm to the touch. You have pus or a bad smell coming from the wound, scrape, corn, or callus. You have a fever. You have a red  line going up your leg. Summary Check your feet every day for blisters, cuts, bruises, sores, and redness. Apply a moisturizing lotion or petroleum jelly to the skin on your feet and to dry, brittle toenails. Wear shoes that fit properly and have enough cushioning. If you have foot problems, report any cuts, sores, or bruises to your health care provider immediately. Schedule a complete foot exam at least once a year (annually) or more often if you have foot problems. This information is not intended to replace advice given to you by your health care provider. Make sure you discuss any questions you have with your health care provider. Document Revised: 07/24/2019 Document Reviewed: 07/24/2019 Elsevier Patient Education  [place] Maintenance, Male Adopting a healthy lifestyle and getting preventive care are important in promoting health and wellness. Ask your health care provider about: The right schedule for you to have regular tests and exams. Things you can do on your own to prevent diseases and keep yourself healthy. What should I know about diet, weight, and exercise? Eat a healthy diet  Eat a diet that includes plenty of vegetables, fruits, low-fat dairy products, and lean protein. Do not eat a lot of foods that are high in solid fats, added sugars, or sodium. Maintain a healthy weight Body mass index (BMI) is a measurement that can be used to identify possible weight problems. It estimates body fat based on height and weight. Your health care provider can help determine your BMI and help you achieve or maintain a healthy weight. Get regular exercise Get regular exercise. This is one of the most important things you can do for your health. Most adults should: Exercise for at least 150 minutes each week. The exercise should increase your heart rate and make you sweat (moderate-intensity exercise). Do strengthening exercises at least twice a week. This is in addition  to the moderate-intensity exercise. Spend less time sitting. Even light physical activity can be beneficial. Watch cholesterol and blood lipids Have your blood tested for lipids and cholesterol at 56 years of age, then have this test every 5 years. You may need to have your cholesterol levels checked more often if: Your lipid or cholesterol levels are high. You are older than 56 years of age. You are at high risk for heart disease. What should I know about cancer screening? Many types of cancers can be detected early and may often be prevented. Depending on your health history and family history, you may need to have cancer screening at various ages. This may include screening for: Colorectal cancer. Prostate cancer. Skin cancer. Lung cancer. What should I know about heart disease, diabetes, and high blood pressure? Blood pressure and heart disease High blood pressure causes heart disease and increases the risk of stroke. This is more likely to develop in people who have high blood  pressure readings or are overweight. Talk with your health care provider about your target blood pressure readings. Have your blood pressure checked: Every 3-5 years if you are 88-51 years of age. Every year if you are 77 years old or older. If you are between the ages of 64 and 32 and are a current or former smoker, ask your health care provider if you should have a one-time screening for abdominal aortic aneurysm (AAA). Diabetes Have regular diabetes screenings. This checks your fasting blood sugar level. Have the screening done: Once every three years after age 62 if you are at a normal weight and have a low risk for diabetes. More often and at a younger age if you are overweight or have a high risk for diabetes. What should I know about preventing infection? Hepatitis B If you have a higher risk for hepatitis B, you should be screened for this virus. Talk with your health care provider to find out if you  are at risk for hepatitis B infection. Hepatitis C Blood testing is recommended for: Everyone born from [birth year] through 1965. Anyone with known risk factors for hepatitis C. Sexually transmitted infections (STIs) You should be screened each year for STIs, including gonorrhea and chlamydia, if: You are sexually active and are younger than 56 years of age. You are older than 56 years of age and your health care provider tells you that you are at risk for this type of infection. Your sexual activity has changed since you were last screened, and you are at increased risk for chlamydia or gonorrhea. Ask your health care provider if you are at risk. Ask your health care provider about whether you are at high risk for HIV. Your health care provider may recommend a prescription medicine to help prevent HIV infection. If you choose to take medicine to prevent HIV, you should first get tested for HIV. You should then be tested every 3 months for as long as you are taking the medicine. Follow these instructions at home: Alcohol use Do not drink alcohol if your health care provider tells you not to drink. If you drink alcohol: Limit how much you have to 0-2 drinks a day. Know how much alcohol is in your drink. In the [place] one drink equals one 12 oz bottle of beer (355 mL), one 5 oz glass of wine (148 mL), or one 1 oz glass of hard liquor (44 mL). Lifestyle Do not use any products that contain nicotine or tobacco. These products include cigarettes, chewing tobacco, and vaping devices, such as e-cigarettes. If you need help quitting, ask your health care provider. Do not use street drugs. Do not share needles. Ask your health care provider for help if you need support or information about quitting drugs. General instructions Schedule regular health, dental, and eye exams. Stay current with your vaccines. Tell your health care provider if: You often feel depressed. You have ever been abused or do not feel  safe at home. Summary Adopting a healthy lifestyle and getting preventive care are important in promoting health and wellness. Follow your health care provider's instructions about healthy diet, exercising, and getting tested or screened for diseases. Follow your health care provider's instructions on monitoring your cholesterol and blood pressure. This information is not intended to replace advice given to you by your health care provider. Make sure you discuss any questions you have with your health care provider. Document Revised: 05/24/2020 Document Reviewed: 05/24/2020 Elsevier Patient Education  [place].

## 2021-03-25 NOTE — Progress Notes (Signed)
Subjective:   Marvin Martin is a 56 y.o. male who presents for an Initial Medicare Annual Wellness Visit.  I connected with  Kinsey Cowsert on 03/25/21 by a audio enabled telemedicine application and verified that I am speaking with the correct person using two identifiers.  Patient Location: Home  Provider Location: Office/Clinic  I discussed the limitations of evaluation and management by telemedicine. The patient expressed understanding and agreed to proceed.   Review of Systems Not applicable       Objective:    Today's Vitals   03/25/21 0946 03/25/21 1014  PainSc: 0-No pain 0-No pain   There is no height or weight on file to calculate BMI.  Advanced Directives 12/12/2020 04/27/2020 07/30/2019 04/05/2019 12/16/2017 09/29/2016 07/31/2016  Does Patient Have a Medical Advance Directive? No Yes Yes No No No;Yes No  Type of Advance Directive - - [place]  Does patient want to make changes to medical advance directive? - - No - Patient declined - - - -  Copy of [place] in Chart? - - No - copy requested - - - -  Would patient like information on creating a medical advance directive? - - - - Yes (ED - Information included in AVS) - -    Current Medications (verified) Outpatient Encounter Medications as of 03/25/2021  Medication Sig   aspirin 81 MG tablet Take 81 mg by mouth daily.   carvedilol (COREG) 12.5 MG tablet Take 12.5 mg by mouth 2 (two) times daily.   cholecalciferol (VITAMIN D) 1000 UNITS tablet Take 1,000 Units by mouth daily.   Continuous Blood Gluc Receiver (FREESTYLE LIBRE 2 READER) DEVI 3 Units by Does not apply route in the morning, at noon, and at bedtime.   Continuous Blood Gluc Sensor (FREESTYLE LIBRE 2 SENSOR) MISC 3 Units by Does not apply route in the morning, at noon, and at bedtime.   diclofenac Sodium (VOLTAREN) 1 % GEL Apply 4 g topically 4 (four) times daily.   doxazosin (CARDURA) 2 MG tablet Take 2 mg  by mouth at bedtime.   ezetimibe (ZETIA) 10 MG tablet Take 1 tablet (10 mg total) by mouth daily. Needs appt for further refills   HYDROcodone-acetaminophen (NORCO/VICODIN) 5-325 MG tablet Take 1 tablet by mouth every 4 (four) hours as needed.   insulin aspart (NOVOLOG FLEXPEN) 100 UNIT/ML FlexPen Inject 8 Units into the skin 3 (three) times daily with meals. Per sliding scale   insulin glargine (LANTUS SOLOSTAR) 100 UNIT/ML Solostar Pen Inject 20 Units into the skin daily.   Insulin Pen Needle 32G X 4 MM MISC 1 application by Does not apply route 4 (four) times daily.   isosorbide mononitrate (IMDUR) 30 MG 24 hr tablet TAKE 1 TABLET BY MOUTH EVERY DAY   magnesium oxide (MAG-OX) 400 (241.3 Mg) MG tablet Take 400 mg by mouth 2 (two) times daily.    nitroGLYCERIN (NITROSTAT) 0.4 MG SL tablet Place 1 tablet (0.4 mg total) under the tongue every 5 (five) minutes as needed for chest pain. Reported on 04/12/2015   nystatin (MYCOSTATIN) 100000 UNIT/ML suspension Take 5 mLs by mouth See admin instructions. Swish and swallow 5 ml's by mouth four times a day   omega-3 acid ethyl esters (LOVAZA) 1 G capsule Take 1 g by mouth daily.    ondansetron (ZOFRAN) 4 MG tablet Take 1 tablet (4 mg total) every 8 (eight) hours as needed by mouth for nausea or vomiting. Reported on  05/24/2015   oxyCODONE-acetaminophen (PERCOCET/ROXICET) 5-325 MG tablet Take 1 tablet by mouth every 6 (six) hours as needed for severe pain.   PRALUENT 75 MG/ML SOAJ INJECT 75 MG INTO THE SKIN EVERY 14 (FOURTEEN) DAYS.   predniSONE (DELTASONE) 5 MG tablet Take 1 tablet by mouth in the morning.    Calcium Carbonate-Vitamin D 600-400 MG-UNIT tablet Take 1 tablet by mouth daily.   lidocaine (LIDODERM) 5 % Place 1 patch onto the skin daily. Remove & Discard patch within 12 hours or as directed by MD (Patient not taking: Reported on 03/25/2021)   Semaglutide, 1 MG/DOSE, (OZEMPIC, 1 MG/DOSE,) 2 MG/1.5ML SOPN Inject 1 mg into the skin once a week.   No  facility-administered encounter medications on file as of 03/25/2021.    Allergies (verified) Patient has no known allergies.   History: Past Medical History:  Diagnosis Date   Chronic systolic CHF (congestive heart failure) ([place])    a. 01/2015 Echo: EF 20-25%, antlat, inflat AK.   CKD (chronic kidney disease), stage III ([facility])    Coronary artery disease    a. s/p MI x 4;  b. Reported h/o 17 stents with recurrent ISR;  c. 02/2014 Cath ([facility]): PCI to unknown vessel;  d. 01/2015 MV: EF 18%, large scar, no ischemia; e. 03/2015 PCI: LM nl, LAD nl, D2 patent stent, RI patent stent, LCX patent stents p/m, RCA 95p ISR (PTCA), 33m, 100d into RPL CTO, RPDA 90 (2.5x16 Synergy DES).   Hyperlipidemia    Hypertensive heart disease    Ischemic cardiomyopathy    a. s/p SJM ICD 2007 w/ gen change in 2012; b. 12/2014 CPX Test: mild to mod HF limitation w/ pVO2 20.3 and nl slope;  c. 01/2015 Echo: EF 20-25%.   Past Surgical History:  Procedure Laterality Date   28 stints placements Bilateral    CARDIAC CATHETERIZATION     Most recent 02/2014    CARDIAC CATHETERIZATION N/A 03/31/2015   Procedure: Left Heart Cath and Coronary Angiography;  Surgeon: Troy Sine, MD;  Location: [facility];  Service: Cardiovascular;  Laterality: N/A;   CARDIAC CATHETERIZATION N/A 03/31/2015   Procedure: Coronary Stent Intervention;  Surgeon: Troy Sine, MD;  Location: [facility];  Service: Cardiovascular;  Laterality: N/A;   CARDIAC CATHETERIZATION N/A 05/27/2015   Procedure: Right/Left Heart Cath and Coronary Angiography;  Surgeon: Jolaine Artist, MD;  Location: [facility];  Service: Cardiovascular;  Laterality: N/A;   CARDIAC CATHETERIZATION N/A 06/23/2015   Procedure: Right/Left Heart Cath and Coronary Angiography;  Surgeon: Jolaine Artist, MD;  Location: [facility];  Service: Cardiovascular;  Laterality: N/A;   HEART TRANSPLANT     INSERT / REPLACE / REMOVE PACEMAKER     St jude 2006   Generator Change 2012    NEPHRECTOMY TRANSPLANTED ORGAN     PACEMAKER GENERATOR CHANGE Bilateral    Family History  Problem Relation Age of Onset   Cancer Mother        died when pt was 32.  She had a h/o heart dzs as well.   Hypertension Mother    Hyperlipidemia Father        Father died when pt was age 64 - he believes that he had a cardiac hx.   CAD Sister    Social History   Socioeconomic History   Marital status: Legally Separated    Spouse name: Not on file   Number of children: Not on file  Years of education: 28   Highest education level: High school graduate  Occupational History   Not on file  Tobacco Use   Smoking status: Never   Smokeless tobacco: Never  Vaping Use   Vaping Use: Never used  Substance and Sexual Activity   Alcohol use: No    Alcohol/week: 0.0 standard drinks   Drug use: No   Sexual activity: Not Currently  Other Topics Concern   Not on file  Social History Narrative   Moved to [place] from [place] in 2016.  Lives alone in [place].   Social Determinants of Health   Financial Resource Strain: Low Risk    Difficulty of Paying Living Expenses: Not hard at all  Food Insecurity: No Food Insecurity   Worried About Charity fundraiser in the Last Year: Never true   [place] in the Last Year: Never true  Transportation Needs: No Transportation Needs   Lack of Transportation (Medical): No   Lack of Transportation (Non-Medical): No  Physical Activity: Insufficiently Active   Days of Exercise per Week: 3 days   Minutes of Exercise per Session: 40 min  Stress: No Stress Concern Present   Feeling of Stress : Only a little  Social Connections: Moderately Integrated   Frequency of Communication with Friends and Family: More than three times a week   Frequency of Social Gatherings with Friends and Family: More than three times a week   Attends Religious Services: More than 4 times per year   Active Member of [employer] or Organizations: Yes   Attends Programme researcher, broadcasting/film/video: More than 4 times per year   Marital Status: Separated    Tobacco Counseling Counseling given: Not Answered   Clinical Intake:     Pain : No/denies pain Pain Score: 0-No pain     Nutritional Risks: None Diabetes: Yes CBG done?: No Did pt. bring in CBG monitor from home?: No  How often do you need to have someone help you when you read instructions, pamphlets, or other written materials from your doctor or pharmacy?: 2 - Rarely  Diabetic?Yes Nutrition Risk Assessment:  Has the patient had any N/V/D within the last 2 months?  No  Does the patient have any non-healing wounds?  No  Has the patient had any unintentional weight loss or weight gain?  No   Diabetes:  Is the patient diabetic?  Yes  If diabetic, was a CBG obtained today?  Yes  Did the patient bring in their glucometer from home?  No  How often do you monitor your CBG's? daily   Financial Strains and Diabetes Management:  Are you having any financial strains with the device, your supplies or your medication? No .  Does the patient want to be seen by Chronic Care Management for management of their diabetes?  No  Would the patient like to be referred to a Nutritionist or for Diabetic Management?  No   Diabetic Exams:  Diabetic Eye Exam: Overdue for diabetic eye exam. Pt has been advised about the importance in completing this exam. Patient advised to call and schedule an eye exam. Diabetic Foot Exam: Overdue, Pt has been advised about the importance in completing this exam. Pt is scheduled for diabetic foot exam on April 07, 2021 at 0910.   Interpreter Needed?: No      Activities of Daily Living No flowsheet data found.  Patient Care Team: Charlott Rakes, MD as PCP - General (Family Medicine)  Indicate any  recent Medical Services you may have received from other than [facility] providers in the past year (date may be approximate).     Assessment:   This is a routine wellness  examination for Marvin Martin.  Hearing/Vision screen No results found.  Dietary issues and exercise activities discussed:     Goals Addressed   None   Depression Screen PHQ 2/9 Scores 03/25/2021 10/07/2020 02/25/2020 10/02/2019 08/11/2019 07/29/2019 01/07/2018  PHQ - 2 Score 0 0 0 0 0 0 0  PHQ- 9 Score - - 1 - 1 2 0    Fall Risk Fall Risk  03/25/2021 10/07/2020 02/25/2020 07/29/2019 01/07/2018  Falls in the past year? 0 0 0 0 0  Number falls in past yr: 0 0 0 - -  Injury with Fall? 0 0 0 - -  Risk for fall due to : No Fall Risks No Fall Risks - No Fall Risks -  Follow up Falls evaluation completed - - - -    FALL RISK PREVENTION PERTAINING TO THE HOME:  Any stairs in or around the home? Yes  If so, are there any without handrails? Yes  Home free of loose throw rugs in walkways, pet beds, electrical cords, etc? Yes  Adequate lighting in your home to reduce risk of falls? Yes   ASSISTIVE DEVICES UTILIZED TO PREVENT FALLS:  Life alert? No  Use of a cane, walker or w/c? No  Grab bars in the bathroom? No  Shower chair or bench in shower? No  Elevated toilet seat or a handicapped toilet? No   TIMED UP AND GO:  Was the test performed? No .  Length of time to ambulate 10 feet: not performed    Cognitive Function:     6CIT Screen 03/25/2021  What Year? 0 points  What month? 0 points  What time? 0 points  Count back from 20 0 points  Months in reverse 0 points  Repeat phrase 0 points  Total Score 0    Immunizations Immunization History  Administered Date(s) Administered   Influenza,inj,Quad PF,6+ Mos 01/07/2018   PFIZER Comirnaty(Gray Top)Covid-19 Tri-Sucrose Vaccine 09/02/2019, 09/23/2019, 06/25/2020   Tdap 01/07/2018    TDAP status: Up to date  Flu Vaccine status: Up to date 11/10/2020  Pneumococcal vaccine status: Due, Education has been provided regarding the importance of this vaccine. Advised may receive this vaccine at local pharmacy or Health Dept. Aware to provide  a copy of the vaccination record if obtained from local pharmacy or Health Dept. Verbalized acceptance and understanding.  Covid-19 vaccine status: Completed vaccines  Qualifies for Shingles Vaccine? Yes   Zostavax completed No   Shingrix Completed?: No.    Education has been provided regarding the importance of this vaccine. Patient has been advised to call insurance company to determine out of pocket expense if they have not yet received this vaccine. Advised may also receive vaccine at local pharmacy or Health Dept. Verbalized acceptance and understanding.  Screening Tests Health Maintenance  Topic Date Due   FOOT EXAM  Never done   OPHTHALMOLOGY EXAM  Never done   Zoster Vaccines- Shingrix (1 of 2) Never done   COVID-19 Vaccine (4 - Booster for Pfizer series) 08/20/2020   HEMOGLOBIN A1C  04/06/2021   TETANUS/TDAP  01/08/2028   INFLUENZA VACCINE  Completed   Hepatitis C Screening  Completed   HIV Screening  Completed   HPV VACCINES  Aged Out   COLONOSCOPY (Pts 45-16yrs Insurance coverage will need to be confirmed)  Discontinued  Health Maintenance  Health Maintenance Due  Topic Date Due   FOOT EXAM  Never done   OPHTHALMOLOGY EXAM  Never done   Zoster Vaccines- Shingrix (1 of 2) Never done   COVID-19 Vaccine (4 - Booster for Pfizer series) 08/20/2020    Colorectal cancer screening: Referral to GI placed March 10,2023. Pt aware the office will call re: appt.  Lung Cancer Screening: (Low Dose CT Chest recommended if Age 7-80 years, 30 pack-year currently smoking OR have quit w/in 15years.) does not qualify.   Lung Cancer Screening Referral: not need  Additional Screening:  Hepatitis C Screening: does not qualify; Completed 06/24/2015  Vision Screening: Recommended annual ophthalmology exams for early detection of glaucoma and other disorders of the eye. Is the patient up to date with their annual eye exam?  No  Who is the provider or what is the name of the office in  which the patient attends annual eye exams? unknown If pt is not established with a provider, would they like to be referred to a provider to establish care? Yes .   Dental Screening: Recommended annual dental exams for proper oral hygiene  Community Resource Referral / Chronic Care Management: CRR required this visit?  No   CCM required this visit?  No      Plan:     I have personally reviewed and noted the following in the patient's chart:   Medical and social history Use of alcohol, tobacco or illicit drugs  Current medications and supplements including opioid prescriptions. Patient is not currently taking opioid prescriptions. Functional ability and status Nutritional status Physical activity Advanced directives List of other physicians Hospitalizations, surgeries, and ER visits in previous 12 months Screenings to include cognitive, depression, and falls Referrals and appointments  In addition, I have reviewed and discussed with patient certain preventive protocols, quality metrics, and best practice recommendations. A written personalized care plan for preventive services as well as general preventive health recommendations were provided to patient.     Carilyn Goodpasture, RN   03/25/2021   Nurse Notes:Non-Face-to-Face 40 minutes  Marvin Martin , Thank you for taking time to come for your Medicare Wellness Visit. I appreciate your ongoing commitment to your health goals. Please review the following plan we discussed and let me know if I can assist you in the future.   These are the goals we discussed:  Goals   None     This is a list of the screening recommended for you and due dates:  Health Maintenance  Topic Date Due   Complete foot exam   Never done   Eye exam for diabetics  Never done   Zoster (Shingles) Vaccine (1 of 2) Never done   COVID-19 Vaccine (4 - Booster for Pfizer series) 08/20/2020   Hemoglobin A1C  04/06/2021   Tetanus Vaccine  01/08/2028   Flu  Shot  Completed   Hepatitis C Screening: USPSTF Recommendation to screen - Ages 18-79 yo.  Completed   HIV Screening  Completed   HPV Vaccine  Aged Out   Colon Cancer Screening  Discontinued

## 2021-04-07 ENCOUNTER — Encounter: Payer: Self-pay | Admitting: Family Medicine

## 2021-04-07 ENCOUNTER — Other Ambulatory Visit: Payer: Self-pay

## 2021-04-07 ENCOUNTER — Ambulatory Visit: Payer: Medicare Other | Attending: Family Medicine | Admitting: Family Medicine

## 2021-04-07 VITALS — BP 144/94 | HR 118 | Ht 67.0 in | Wt 138.4 lb

## 2021-04-07 DIAGNOSIS — E785 Hyperlipidemia, unspecified: Secondary | ICD-10-CM | POA: Diagnosis not present

## 2021-04-07 DIAGNOSIS — Z940 Kidney transplant status: Secondary | ICD-10-CM | POA: Diagnosis not present

## 2021-04-07 DIAGNOSIS — I255 Ischemic cardiomyopathy: Secondary | ICD-10-CM | POA: Diagnosis not present

## 2021-04-07 DIAGNOSIS — E1169 Type 2 diabetes mellitus with other specified complication: Secondary | ICD-10-CM | POA: Insufficient documentation

## 2021-04-07 DIAGNOSIS — Z941 Heart transplant status: Secondary | ICD-10-CM | POA: Diagnosis not present

## 2021-04-07 DIAGNOSIS — I5022 Chronic systolic (congestive) heart failure: Secondary | ICD-10-CM | POA: Insufficient documentation

## 2021-04-07 DIAGNOSIS — E1159 Type 2 diabetes mellitus with other circulatory complications: Secondary | ICD-10-CM

## 2021-04-07 DIAGNOSIS — T8621 Heart transplant rejection: Secondary | ICD-10-CM | POA: Diagnosis not present

## 2021-04-07 DIAGNOSIS — N183 Chronic kidney disease, stage 3 unspecified: Secondary | ICD-10-CM | POA: Insufficient documentation

## 2021-04-07 DIAGNOSIS — I152 Hypertension secondary to endocrine disorders: Secondary | ICD-10-CM

## 2021-04-07 DIAGNOSIS — Z794 Long term (current) use of insulin: Secondary | ICD-10-CM

## 2021-04-07 DIAGNOSIS — I130 Hypertensive heart and chronic kidney disease with heart failure and stage 1 through stage 4 chronic kidney disease, or unspecified chronic kidney disease: Secondary | ICD-10-CM | POA: Diagnosis not present

## 2021-04-07 DIAGNOSIS — Z79899 Other long term (current) drug therapy: Secondary | ICD-10-CM | POA: Insufficient documentation

## 2021-04-07 LAB — POCT GLYCOSYLATED HEMOGLOBIN (HGB A1C): HbA1c, POC (controlled diabetic range): 8.2 % — AB (ref 0.0–7.0)

## 2021-04-07 LAB — GLUCOSE, POCT (MANUAL RESULT ENTRY): POC Glucose: 232 mg/dl — AB (ref 70–99)

## 2021-04-07 MED ORDER — SEMAGLUTIDE (2 MG/DOSE) 8 MG/3ML ~~LOC~~ SOPN: 2.0000 mg | PEN_INJECTOR | SUBCUTANEOUS | 6 refills | Status: DC

## 2021-04-07 NOTE — Patient Instructions (Signed)
Semaglutide Injection ?What is this medication? ?SEMAGLUTIDE (SEM a GLOO tide) treats type 2 diabetes. It works by increasing insulin levels in your body, which decreases your blood sugar (glucose). It also reduces the amount of sugar released into the blood and slows down your digestion. It can also be used to lower the risk of heart attack and stroke in people with type 2 diabetes. Changes to diet and exercise are often combined with this medication. ?This medicine may be used for other purposes; ask your health care provider or pharmacist if you have questions. ?COMMON BRAND NAME(S): OZEMPIC ?What should I tell my care team before I take this medication? ?They need to know if you have any of these conditions: ?Endocrine tumors (MEN 2) or if someone in your family had these tumors ?Eye disease, vision problems ?History of pancreatitis ?Kidney disease ?Stomach problems ?Thyroid cancer or if someone in your family had thyroid cancer ?An unusual or allergic reaction to semaglutide, other medications, foods, dyes, or preservatives ?Pregnant or trying to get pregnant ?Breast-feeding ?How should I use this medication? ?This medication is for injection under the skin of your upper leg (thigh), stomach area, or upper arm. It is given once every week (every 7 days). You will be taught how to prepare and give this medication. Use exactly as directed. Take your medication at regular intervals. Do not take it more often than directed. ?If you use this medication with insulin, you should inject this medication and the insulin separately. Do not mix them together. Do not give the injections right next to each other. Change (rotate) injection sites with each injection. ?It is important that you put your used needles and syringes in a special sharps container. Do not put them in a trash can. If you do not have a sharps container, call your pharmacist or care team to get one. ?A special MedGuide will be given to you by the  pharmacist with each prescription and refill. Be sure to read this information carefully each time. ?This medication comes with INSTRUCTIONS FOR USE. Ask your pharmacist for directions on how to use this medication. Read the information carefully. Talk to your pharmacist or care team if you have questions. ?Talk to your care team about the use of this medication in children. Special care may be needed. ?Overdosage: If you think you have taken too much of this medicine contact a poison control center or emergency room at once. ?NOTE: This medicine is only for you. Do not share this medicine with others. ?What if I miss a dose? ?If you miss a dose, take it as soon as you can within 5 days after the missed dose. Then take your next dose at your regular weekly time. If it has been longer than 5 days after the missed dose, do not take the missed dose. Take the next dose at your regular time. Do not take double or extra doses. If you have questions about a missed dose, contact your care team for advice. ?What may interact with this medication? ?Other medications for diabetes ?Many medications may cause changes in blood sugar, these include: ?Alcohol containing beverages ?Antiviral medications for HIV or AIDS ?Aspirin and aspirin-like medications ?Certain medications for blood pressure, heart disease, irregular heart beat ?Chromium ?Diuretics ?Male hormones, such as estrogens or progestins, birth control pills ?Fenofibrate ?Gemfibrozil ?Isoniazid ?Lanreotide ?Male hormones or anabolic steroids ?MAOIs like Carbex, Eldepryl, Marplan, Nardil, and Parnate ?Medications for weight loss ?Medications for allergies, asthma, cold, or cough ?Medications for depression,   anxiety, or psychotic disturbances ?Niacin ?Nicotine ?NSAIDs, medications for pain and inflammation, like ibuprofen or naproxen ?Octreotide ?Pasireotide ?Pentamidine ?Phenytoin ?Probenecid ?Quinolone antibiotics such as ciprofloxacin, levofloxacin, ofloxacin ?Some  herbal dietary supplements ?Steroid medications such as prednisone or cortisone ?Sulfamethoxazole; trimethoprim ?Thyroid hormones ?Some medications can hide the warning symptoms of low blood sugar (hypoglycemia). You may need to monitor your blood sugar more closely if you are taking one of these medications. These include: ?Beta-blockers, often used for high blood pressure or heart problems (examples include atenolol, metoprolol, propranolol) ?Clonidine ?Guanethidine ?Reserpine ?This list may not describe all possible interactions. Give your health care provider a list of all the medicines, herbs, non-prescription drugs, or dietary supplements you use. Also tell them if you smoke, drink alcohol, or use illegal drugs. Some items may interact with your medicine. ?What should I watch for while using this medication? ?Visit your care team for regular checks on your progress. ?Drink plenty of fluids while taking this medication. Check with your care team if you get an attack of severe diarrhea, nausea, and vomiting. The loss of too much body fluid can make it dangerous for you to take this medication. ?A test called the HbA1C (A1C) will be monitored. This is a simple blood test. It measures your blood sugar control over the last 2 to 3 months. You will receive this test every 3 to 6 months. ?Learn how to check your blood sugar. Learn the symptoms of low and high blood sugar and how to manage them. ?Always carry a quick-source of sugar with you in case you have symptoms of low blood sugar. Examples include hard sugar candy or glucose tablets. Make sure others know that you can choke if you eat or drink when you develop serious symptoms of low blood sugar, such as seizures or unconsciousness. They must get medical help at once. ?Tell your care team if you have high blood sugar. You might need to change the dose of your medication. If you are sick or exercising more than usual, you might need to change the dose of your  medication. ?Do not skip meals. Ask your care team if you should avoid alcohol. Many nonprescription cough and cold products contain sugar or alcohol. These can affect blood sugar. ?Pens should never be shared. Even if the needle is changed, sharing may result in passing of viruses like hepatitis or HIV. ?Wear a medical ID bracelet or chain, and carry a card that describes your disease and details of your medication and dosage times. ?Do not become pregnant while taking this medication. Women should inform their care team if they wish to become pregnant or think they might be pregnant. There is a potential for serious side effects to an unborn child. Talk to your care team for more information. ?What side effects may I notice from receiving this medication? ?Side effects that you should report to your care team as soon as possible: ?Allergic reactions--skin rash, itching, hives, swelling of the face, lips, tongue, or throat ?Change in vision ?Dehydration--increased thirst, dry mouth, feeling faint or lightheaded, headache, dark yellow or brown urine ?Gallbladder problems--severe stomach pain, nausea, vomiting, fever ?Heart palpitations--rapid, pounding, or irregular heartbeat ?Kidney injury--decrease in the amount of urine, swelling of the ankles, hands, or feet ?Pancreatitis--severe stomach pain that spreads to your back or gets worse after eating or when touched, fever, nausea, vomiting ?Thyroid cancer--new mass or lump in the neck, pain or trouble swallowing, trouble breathing, hoarseness ?Side effects that usually do not require medical   attention (report to your care team if they continue or are bothersome): ?Diarrhea ?Loss of appetite ?Nausea ?Stomach pain ?Vomiting ?This list may not describe all possible side effects. Call your doctor for medical advice about side effects. You may report side effects to FDA at 1-800-FDA-1088. ?Where should I keep my medication? ?Keep out of the reach of children. ?Store  unopened pens in a refrigerator between 2 and 8 degrees C (36 and 46 degrees F). Do not freeze. Protect from light and heat. After you first use the pen, it can be stored for 56 days at room temperature between 15 and

## 2021-04-07 NOTE — Progress Notes (Signed)
? ?Subjective:  ?Patient ID: Marvin Martin, male    DOB: [birth year]  Age: 56 y.o. MRN: [id number] ? ?CC: Diabetes ? ? ?HPI ?Marvin Martin is a 56 y.o. year old male with a history of type 2 diabetes mellitus (A1c 8.2), ischemic cardiomyopathy (status post heart transplant at [facility] in 12/2018 currently on immunosuppressive therapy), chronic kidney disease stage III (status post renal transplant in 06/2019). ?He did have a hospitalization late last year and earlier this year at [facility] for acute rejection of cardiac transplant and has been closely followed by the cardiac transplant and renal transplant teams. ?Underwent cardiac biopsy at last visit on 02/22/2021. ?Pathology report: ? ?DIAGNOSIS    ?A. Endomyocardial Biopsy, Transplant: ?  ?-No evidence of significant acute cellular allograft rejection (ISHLT Grade 0R; 2004). ?  ?-pAMR 0 - No significant evidence of antibody mediated rejection on routine and immunohistology. ?  ?-Focal replacement fibrosis. ?  ?-4 evaluable fragments of myocardium (including frozen fragment). ?   ? ? ?Interval History: ?Followed by Endocrine at [facility], A1c is 8.2 up from 6.3 previously. He states fasting sugars are around 149 and random in the 150. He is currently on prednisone from [facility]. ?He administers 4 units of Lantus qhs as well as Ozempic 2mg /dose 8mg /3mg  SOPN  (but then he states that he counts 8 clicks to administer).  I have reviewed his medication list from [facility] and he seems to be confused about his diabetes regimen and his list is not accurate.   ?Upon review of Endocrine notes from [place], he should be on Ozempic 2mg  weekly, Lantus 20units daily and Glipizide 2.5mg .  He is currently not taking glipizide. ?He has a [place] who comes in once/week. ? ?His appetite is good and he has a good exercise tolerance.  Currently retired and just stays at home. ?Denies acute concerns today. ?Past Medical History:  ?Diagnosis Date  ? Chronic  systolic CHF (congestive heart failure) ([place])   ? a. 01/2015 Echo: EF 20-25%, antlat, inflat AK.  ? CKD (chronic kidney disease), stage III ([place])   ? Coronary artery disease   ? a. s/p MI x 4;  b. Reported h/o 17 stents with recurrent ISR;  c. 02/2014 Cath ([place]): PCI to unknown vessel;  d. 01/2015 MV: EF 18%, large scar, no ischemia; e. 03/2015 PCI: LM nl, LAD nl, D2 patent stent, RI patent stent, LCX patent stents p/m, RCA 95p ISR (PTCA), 22m, 100d into RPL CTO, RPDA 90 (2.5x16 Synergy DES).  ? Hyperlipidemia   ? Hypertensive heart disease   ? Ischemic cardiomyopathy   ? a. s/p SJM ICD 2007 w/ gen change in 2012; b. 12/2014 CPX Test: mild to mod HF limitation w/ pVO2 20.3 and nl slope;  c. 01/2015 Echo: EF 20-25%.  ? ? ?Past Surgical History:  ?Procedure Laterality Date  ? 39 stints placements Bilateral   ? CARDIAC CATHETERIZATION    ? Most recent 02/2014   ? CARDIAC CATHETERIZATION N/A 03/31/2015  ? Procedure: Left Heart Cath and Coronary Angiography;  Surgeon: Troy Sine, MD;  Location: [facility] [facility];  Service: Cardiovascular;  Laterality: N/A;  ? CARDIAC CATHETERIZATION N/A 03/31/2015  ? Procedure: Coronary Stent Intervention;  Surgeon: Troy Sine, MD;  Location: [place] [facility];  Service: Cardiovascular;  Laterality: N/A;  ? CARDIAC CATHETERIZATION N/A 05/27/2015  ? Procedure: Right/Left Heart Cath and Coronary Angiography;  Surgeon: Jolaine Artist, MD;  Location: [place] [facility];  Service: Cardiovascular;  Laterality: N/A;  ? CARDIAC CATHETERIZATION N/A 06/23/2015  ? Procedure: Right/Left Heart Cath and Coronary Angiography;  Surgeon: Jolaine Artist, MD;  Location: [place] [facility];  Service: Cardiovascular;  Laterality: N/A;  ? HEART TRANSPLANT    ? INSERT / REPLACE / REMOVE PACEMAKER    ? [facility] jud[facility] 2006  Generator Change 2012   ? NEPHRECTOMY TRANSPLANTED ORGAN    ? PACEMAKER GENERATOR CHANGE Bilateral   ? ? ?Family History  ?Problem Relation Age of Onset  ? Cancer Mother   ?     died when pt  was 58.  She had a h/o heart dzs as well.  ? Hypertension Mother   ? Hyperlipidemia Father   ?     Father died when pt was age 50 - he believes that he had a cardiac hx.  ? CAD Sister   ? ? ?Social History  ? ?Socioeconomic History  ? Marital status: Legally Separated  ?  Spouse name: Not on file  ? Number of children: Not on file  ? Years of education: 20  ? Highest education level: High school graduate  ?Occupational History  ? Not on file  ?Tobacco Use  ? Smoking status: Never  ? Smokeless tobacco: Never  ?Vaping Use  ? Vaping Use: Never used  ?Substance and Sexual Activity  ? Alcohol use: No  ?  Alcohol/week: 0.0 standard drinks  ? Drug use: No  ? Sexual activity: Not Currently  ?Other Topics Concern  ? Not on file  ?Social History Narrative  ? Moved to [place] from [place] in 2016.  Lives alone in [place].  ? ?Social Determinants of Health  ? ?Financial Resource Strain: Low Risk   ? Difficulty of Paying Living Expenses: Not hard at all  ?Food Insecurity: No Food Insecurity  ? Worried About Charity fundraiser in the Last Year: Never true  ? Ran Out of Food in the Last Year: Never true  ?Transportation Needs: No Transportation Needs  ? Lack of Transportation (Medical): No  ? Lack of Transportation (Non-Medical): No  ?Physical Activity: Insufficiently Active  ? Days of Exercise per Week: 3 days  ? Minutes of Exercise per Session: 40 min  ?Stress: No Stress Concern Present  ? Feeling of Stress : Only a little  ?Social Connections: Moderately Integrated  ? Frequency of Communication with Friends and Family: More than three times a week  ? Frequency of Social Gatherings with Friends and Family: More than three times a week  ? Attends Religious Services: More than 4 times per year  ? Active Member of Clubs or Organizations: Yes  ? Attends Archivist Meetings: More than 4 times per year  ? Marital Status: Separated  ? ? ?No Known Allergies ? ?Outpatient Medications Prior to Visit  ?Medication Sig Dispense Refill  ? aspirin  81 MG tablet Take 81 mg by mouth daily.    ? carvedilol (COREG) 12.5 MG tablet Take 12.5 mg by mouth 2 (two) times daily.    ? cholecalciferol (VITAMIN D) 1000 UNITS tablet Take 1,000 Units by mouth daily.    ? Continuous Blood Gluc Receiver (FREESTYLE LIBRE 2 READER) DEVI 3 Units by Does not apply route in the morning, at noon, and at bedtime. 1 each 4  ? Continuous Blood Gluc Sensor (FREESTYLE LIBRE 2 SENSOR) MISC 3 Units by Does not apply route in the morning, at noon, and at bedtime. 1 each 4  ? diclofenac Sodium (VOLTAREN)  1 % GEL Apply 4 g topically 4 (four) times daily. 100 g 1  ? doxazosin (CARDURA) 2 MG tablet Take 2 mg by mouth at bedtime.    ? ezetimibe (ZETIA) 10 MG tablet Take 1 tablet (10 mg total) by mouth daily. Needs appt for further refills 90 tablet 0  ? HYDROcodone-acetaminophen (NORCO/VICODIN) 5-325 MG tablet Take 1 tablet by mouth every 4 (four) hours as needed. 10 tablet 0  ? insulin glargine (LANTUS SOLOSTAR) 100 UNIT/ML Solostar Pen Inject 20 Units into the skin daily. 30 mL 6  ? Insulin Pen Needle 32G X 4 MM MISC 1 application by Does not apply route 4 (four) times daily. 200 each 5  ? isosorbide mononitrate (IMDUR) 30 MG 24 hr tablet TAKE 1 TABLET BY MOUTH EVERY DAY 90 tablet 3  ? lidocaine (LIDODERM) 5 % Place 1 patch onto the skin daily. Remove & Discard patch within 12 hours or as directed by MD 30 patch 0  ? magnesium oxide (MAG-OX) 400 (241.3 Mg) MG tablet Take 400 mg by mouth 2 (two) times daily.     ? nitroGLYCERIN (NITROSTAT) 0.4 MG SL tablet Place 1 tablet (0.4 mg total) under the tongue every 5 (five) minutes as needed for chest pain. Reported on 04/12/2015 60 tablet 1  ? nystatin (MYCOSTATIN) 100000 UNIT/ML suspension Take 5 mLs by mouth See admin instructions. Swish and swallow 5 ml's by mouth four times a day    ? omega-3 acid ethyl esters (LOVAZA) 1 G capsule Take 1 g by mouth daily.     ? ondansetron (ZOFRAN) 4 MG tablet Take 1 tablet (4 mg total) every 8 (eight) hours as  needed by mouth for nausea or vomiting. Reported on 05/24/2015 30 tablet 1  ? oxyCODONE-acetaminophen (PERCOCET/ROXICET) 5-325 MG tablet Take 1 tablet by mouth every 6 (six) hours as needed for severe pai

## 2021-04-22 ENCOUNTER — Encounter (HOSPITAL_COMMUNITY): Payer: Self-pay | Admitting: Emergency Medicine

## 2021-04-22 ENCOUNTER — Observation Stay (HOSPITAL_COMMUNITY)
Admission: EM | Admit: 2021-04-22 | Discharge: 2021-04-24 | Disposition: A | Discharge status: Home Health | Payer: Medicare Other | Attending: Internal Medicine | Admitting: Internal Medicine

## 2021-04-22 DIAGNOSIS — Z79899 Other long term (current) drug therapy: Secondary | ICD-10-CM | POA: Diagnosis not present

## 2021-04-22 DIAGNOSIS — E860 Dehydration: Secondary | ICD-10-CM | POA: Diagnosis not present

## 2021-04-22 DIAGNOSIS — R739 Hyperglycemia, unspecified: Secondary | ICD-10-CM | POA: Diagnosis present

## 2021-04-22 DIAGNOSIS — E871 Hypo-osmolality and hyponatremia: Secondary | ICD-10-CM | POA: Insufficient documentation

## 2021-04-22 DIAGNOSIS — E1122 Type 2 diabetes mellitus with diabetic chronic kidney disease: Secondary | ICD-10-CM | POA: Diagnosis not present

## 2021-04-22 DIAGNOSIS — I5020 Unspecified systolic (congestive) heart failure: Secondary | ICD-10-CM | POA: Insufficient documentation

## 2021-04-22 DIAGNOSIS — N183 Chronic kidney disease, stage 3 unspecified: Secondary | ICD-10-CM | POA: Diagnosis present

## 2021-04-22 DIAGNOSIS — N179 Acute kidney failure, unspecified: Secondary | ICD-10-CM | POA: Diagnosis not present

## 2021-04-22 DIAGNOSIS — Z794 Long term (current) use of insulin: Secondary | ICD-10-CM | POA: Diagnosis not present

## 2021-04-22 DIAGNOSIS — Z940 Kidney transplant status: Secondary | ICD-10-CM | POA: Diagnosis not present

## 2021-04-22 DIAGNOSIS — I2510 Atherosclerotic heart disease of native coronary artery without angina pectoris: Secondary | ICD-10-CM | POA: Insufficient documentation

## 2021-04-22 DIAGNOSIS — Z20822 Contact with and (suspected) exposure to covid-19: Secondary | ICD-10-CM | POA: Insufficient documentation

## 2021-04-22 DIAGNOSIS — Z7982 Long term (current) use of aspirin: Secondary | ICD-10-CM | POA: Insufficient documentation

## 2021-04-22 DIAGNOSIS — E875 Hyperkalemia: Secondary | ICD-10-CM | POA: Diagnosis not present

## 2021-04-22 DIAGNOSIS — N1832 Chronic kidney disease, stage 3b: Secondary | ICD-10-CM | POA: Insufficient documentation

## 2021-04-22 DIAGNOSIS — I130 Hypertensive heart and chronic kidney disease with heart failure and stage 1 through stage 4 chronic kidney disease, or unspecified chronic kidney disease: Secondary | ICD-10-CM | POA: Insufficient documentation

## 2021-04-22 DIAGNOSIS — Z7984 Long term (current) use of oral hypoglycemic drugs: Secondary | ICD-10-CM | POA: Insufficient documentation

## 2021-04-22 DIAGNOSIS — E1110 Type 2 diabetes mellitus with ketoacidosis without coma: Secondary | ICD-10-CM | POA: Diagnosis present

## 2021-04-22 DIAGNOSIS — E1165 Type 2 diabetes mellitus with hyperglycemia: Secondary | ICD-10-CM | POA: Diagnosis not present

## 2021-04-22 DIAGNOSIS — Z941 Heart transplant status: Secondary | ICD-10-CM | POA: Insufficient documentation

## 2021-04-22 HISTORY — DX: Type 2 diabetes mellitus without complications: E11.9

## 2021-04-22 LAB — CBC
HCT: 42 % (ref 39.0–52.0)
Hemoglobin: 14.3 g/dL (ref 13.0–17.0)
MCH: 32.4 pg (ref 26.0–34.0)
MCHC: 34 g/dL (ref 30.0–36.0)
MCV: 95 fL (ref 80.0–100.0)
Platelets: 199 10*3/uL (ref 150–400)
RBC: 4.42 MIL/uL (ref 4.22–5.81)
RDW: 12.7 % (ref 11.5–15.5)
WBC: 8.6 10*3/uL (ref 4.0–10.5)
nRBC: 0 % (ref 0.0–0.2)

## 2021-04-22 LAB — BASIC METABOLIC PANEL
Anion gap: 19 — ABNORMAL HIGH (ref 5–15)
Anion gap: 20 — ABNORMAL HIGH (ref 5–15)
BUN: 58 mg/dL — ABNORMAL HIGH (ref 6–20)
BUN: 59 mg/dL — ABNORMAL HIGH (ref 6–20)
CO2: 16 mmol/L — ABNORMAL LOW (ref 22–32)
CO2: 18 mmol/L — ABNORMAL LOW (ref 22–32)
Calcium: 9.5 mg/dL (ref 8.9–10.3)
Calcium: 10.5 mg/dL — ABNORMAL HIGH (ref 8.9–10.3)
Chloride: 86 mmol/L — ABNORMAL LOW (ref 98–111)
Chloride: 86 mmol/L — ABNORMAL LOW (ref 98–111)
Creatinine, Ser: 2.98 mg/dL — ABNORMAL HIGH (ref 0.61–1.24)
Creatinine, Ser: 2.77 mg/dL — ABNORMAL HIGH (ref 0.61–1.24)
GFR, Estimated: 24 mL/min — ABNORMAL LOW (ref >60)
GFR, Estimated: 26 mL/min — ABNORMAL LOW (ref >60)
Glucose, Bld: 1125 mg/dL (ref 70–99)
Glucose, Bld: 910 mg/dL (ref 70–99)
Potassium: 5.4 mmol/L — ABNORMAL HIGH (ref 3.5–5.1)
Potassium: 6.7 mmol/L (ref 3.5–5.1)
Sodium: 121 mmol/L — ABNORMAL LOW (ref 135–145)
Sodium: 124 mmol/L — ABNORMAL LOW (ref 135–145)

## 2021-04-22 LAB — URINALYSIS, ROUTINE W REFLEX MICROSCOPIC
Bacteria, UA: NONE SEEN
Bilirubin Urine: NEGATIVE
Glucose, UA: >=500 mg/dL — AB
Hgb urine dipstick: NEGATIVE
Ketones, ur: 5 mg/dL — AB
Leukocytes,Ua: NEGATIVE
Nitrite: NEGATIVE
Protein, ur: NEGATIVE mg/dL
Specific Gravity, Urine: 1.025 (ref 1.005–1.030)
pH: 5 (ref 5.0–8.0)

## 2021-04-22 LAB — I-STAT VENOUS BLOOD GAS, ED
Acid-base deficit: 5 mmol/L — ABNORMAL HIGH (ref 0.0–2.0)
Bicarbonate: 19.3 mmol/L — ABNORMAL LOW (ref 20.0–28.0)
Calcium, Ion: 1.06 mmol/L — ABNORMAL LOW (ref 1.15–1.40)
HCT: 44 % (ref 39.0–52.0)
Hemoglobin: 15 g/dL (ref 13.0–17.0)
O2 Saturation: 99 %
Potassium: 5.4 mmol/L — ABNORMAL HIGH (ref 3.5–5.1)
Sodium: 120 mmol/L — ABNORMAL LOW (ref 135–145)
TCO2: 20 mmol/L — ABNORMAL LOW (ref 22–32)
pCO2, Ven: 34.6 mmHg — ABNORMAL LOW (ref 44–60)
pH, Ven: 7.355 (ref 7.25–7.43)
pO2, Ven: 129 mmHg — ABNORMAL HIGH (ref 32–45)

## 2021-04-22 LAB — CBG MONITORING, ED
Glucose-Capillary: >600 mg/dL (ref 70–99)
Glucose-Capillary: >600 mg/dL (ref 70–99)
Glucose-Capillary: >600 mg/dL (ref 70–99)
Glucose-Capillary: >600 mg/dL (ref 70–99)
Glucose-Capillary: >600 mg/dL (ref 70–99)
Glucose-Capillary: 558 mg/dL (ref 70–99)

## 2021-04-22 LAB — RESP PANEL BY RT-PCR (FLU A&B, COVID) ARPGX2
Influenza A by PCR: NEGATIVE
Influenza B by PCR: NEGATIVE
SARS Coronavirus 2 by RT PCR: NEGATIVE

## 2021-04-22 LAB — GLUCOSE, CAPILLARY
Glucose-Capillary: 396 mg/dL — ABNORMAL HIGH (ref 70–99)
Glucose-Capillary: 244 mg/dL — ABNORMAL HIGH (ref 70–99)

## 2021-04-22 LAB — BETA-HYDROXYBUTYRIC ACID: Beta-Hydroxybutyric Acid: 2.36 mmol/L — ABNORMAL HIGH (ref 0.05–0.27)

## 2021-04-22 LAB — HIV ANTIBODY (ROUTINE TESTING W REFLEX): HIV Screen 4th Generation wRfx: NONREACTIVE

## 2021-04-22 MED ORDER — EZETIMIBE 10 MG PO TABS
10.0000 mg | ORAL_TABLET | Freq: Every day | ORAL | Status: DC
  Administered 2021-04-23 – 2021-04-24 (×2): 10 mg via ORAL
  Filled 2021-04-22 (×3): qty 1

## 2021-04-22 MED ORDER — DOXAZOSIN MESYLATE 2 MG PO TABS
2.0000 mg | ORAL_TABLET | Freq: Every day | ORAL | Status: DC
  Administered 2021-04-22 – 2021-04-23 (×2): 2 mg via ORAL
  Filled 2021-04-22 (×4): qty 1

## 2021-04-22 MED ORDER — TACROLIMUS 1 MG PO CAPS
4.0000 mg | ORAL_CAPSULE | Freq: Two times a day (BID) | ORAL | Status: DC
Start: 2021-04-22 — End: 2021-04-24
  Administered 2021-04-22 – 2021-04-24 (×4): 4 mg via ORAL
  Filled 2021-04-22 (×6): qty 4

## 2021-04-22 MED ORDER — VITAMIN D 25 MCG (1000 UNIT) PO TABS
1000.0000 [IU] | ORAL_TABLET | Freq: Every day | ORAL | Status: DC
  Administered 2021-04-23 – 2021-04-24 (×2): 1000 [IU] via ORAL
  Filled 2021-04-22 (×3): qty 1

## 2021-04-22 MED ORDER — PREDNISONE 5 MG PO TABS
5.0000 mg | ORAL_TABLET | Freq: Every morning | ORAL | Status: DC
  Administered 2021-04-23 – 2021-04-24 (×2): 5 mg via ORAL
  Filled 2021-04-22 (×2): qty 1

## 2021-04-22 MED ORDER — INSULIN REGULAR(HUMAN) IN NACL 100-0.9 UT/100ML-% IV SOLN
INTRAVENOUS | Status: DC
  Administered 2021-04-22: 2.4 [IU]/h via INTRAVENOUS
  Administered 2021-04-22: 4.8 [IU]/h via INTRAVENOUS
  Administered 2021-04-22: 8 [IU]/h via INTRAVENOUS
  Filled 2021-04-22: qty 100

## 2021-04-22 MED ORDER — LACTATED RINGERS IV BOLUS
1000.0000 mL | Freq: Once | INTRAVENOUS | Status: AC
Start: 2021-04-22 — End: 2021-04-22
  Administered 2021-04-22: 1000 mL via INTRAVENOUS

## 2021-04-22 MED ORDER — LACTATED RINGERS IV SOLN: INTRAVENOUS | Status: DC

## 2021-04-22 MED ORDER — HEPARIN SODIUM (PORCINE) 5000 UNIT/ML IJ SOLN
5000.0000 [IU] | Freq: Three times a day (TID) | INTRAMUSCULAR | Status: DC
  Administered 2021-04-22 – 2021-04-24 (×5): 5000 [IU] via SUBCUTANEOUS
  Filled 2021-04-22 (×5): qty 1

## 2021-04-22 MED ORDER — CARVEDILOL 12.5 MG PO TABS: 12.5000 mg | ORAL_TABLET | Freq: Two times a day (BID) | ORAL | Status: DC

## 2021-04-22 MED ORDER — LACTATED RINGERS IV BOLUS
1000.0000 mL | Freq: Once | INTRAVENOUS | Status: AC
  Administered 2021-04-22: 1000 mL via INTRAVENOUS

## 2021-04-22 MED ORDER — DEXTROSE IN LACTATED RINGERS 5 % IV SOLN: INTRAVENOUS | Status: DC

## 2021-04-22 MED ORDER — IVABRADINE HCL 5 MG PO TABS
2.5000 mg | ORAL_TABLET | Freq: Two times a day (BID) | ORAL | Status: DC
  Administered 2021-04-23 – 2021-04-24 (×3): 2.5 mg via ORAL
  Filled 2021-04-22 (×4): qty 1

## 2021-04-22 MED ORDER — ASPIRIN 81 MG PO CHEW
81.0000 mg | CHEWABLE_TABLET | Freq: Every day | ORAL | Status: DC
  Administered 2021-04-22 – 2021-04-24 (×3): 81 mg via ORAL
  Filled 2021-04-22 (×3): qty 1

## 2021-04-22 MED ORDER — DEXTROSE 50 % IV SOLN: 0.0000 mL | INTRAVENOUS | Status: DC | PRN

## 2021-04-22 MED ORDER — ASPIRIN 81 MG PO CHEW: 81.0000 mg | CHEWABLE_TABLET | Freq: Every day | ORAL | Status: DC

## 2021-04-22 NOTE — ED Provider Notes (Signed)
?[facility] ?Provider Note ? ? ?CSN: [id number] ?Arrival date & time: 04/22/21  1234 ? ?  ? ?History ? ?Chief Complaint  ?Patient presents with  ? Hyperglycemia  ? ? ?Marvin Martin is a 56 y.o. male. ? ?Patient presents to ER chief complaint of glucose reading high at home for the past 4 days.  He states that he has been using Lantus and Ozempic for about 3 weeks now.  He does not know the exact dosages only that its "click to a certain number on the pen and then injected."  He reportedly had a appointment with his primary care doctors about a week or 2 ago and similarly at that appointment appears to have had some confusion about exact dosages for his medications.  Otherwise patient states he is in no acute distress has no pain or discomfort no headache no chest pain abdominal pain no fever no cough no vomiting or diarrhea. ? ?History is complicated with history of heart transplant at [facility] in 2020 as well as kidney transplant in 2021.  Continues on tacrolimus per patient.  Takes 5 mg prednisone daily at baseline. ? ? ?  ? ?Home Medications ?Prior to Admission medications   ?Medication Sig Start Date End Date Taking? Authorizing Provider  ?aspirin 81 MG tablet Take 81 mg by mouth daily.    [provider]  ?Calcium Carbonate-Vitamin D 600-400 MG-UNIT tablet Take 1 tablet by mouth daily. 01/06/19 01/06/20  [provider]  ?carvedilol (COREG) 12.5 MG tablet Take 12.5 mg by mouth 2 (two) times daily. 03/23/19   [provider]  ?cholecalciferol (VITAMIN D) 1000 UNITS tablet Take 1,000 Units by mouth daily.    [provider]  ?Continuous Blood Gluc Receiver (FREESTYLE LIBRE 2 READER) DEVI 3 Units by Does not apply route in the morning, at noon, and at bedtime. 07/31/19   Georgette Shell, MD  ?Continuous Blood Gluc Sensor (FREESTYLE LIBRE 2 SENSOR) MISC 3 Units by Does not apply route in the morning, at noon, and at bedtime. 07/31/19    Georgette Shell, MD  ?diclofenac Sodium (VOLTAREN) 1 % GEL Apply 4 g topically 4 (four) times daily. 10/07/20   Charlott Rakes, MD  ?doxazosin (CARDURA) 2 MG tablet Take 2 mg by mouth at bedtime. 03/11/19   [provider]  ?ezetimibe (ZETIA) 10 MG tablet Take 1 tablet (10 mg total) by mouth daily. Needs appt for further refills 05/27/19   Bensimhon, Shaune Pascal, MD  ?HYDROcodone-acetaminophen (NORCO/VICODIN) 5-325 MG tablet Take 1 tablet by mouth every 4 (four) hours as needed. 12/16/17   Recardo Evangelist, PA-C  ?insulin glargine (LANTUS SOLOSTAR) 100 UNIT/ML Solostar Pen Inject 20 Units into the skin daily. 03/11/20   Charlott Rakes, MD  ?Insulin Pen Needle 32G X 4 MM MISC 1 application by Does not apply route 4 (four) times daily. 04/07/19   Guilford Shi, MD  ?isosorbide mononitrate (IMDUR) 30 MG 24 hr tablet TAKE 1 TABLET BY MOUTH EVERY DAY ?Patient taking differently: Take 30 mg by mouth daily. 08/13/19   Bensimhon, Shaune Pascal, MD  ?lidocaine (LIDODERM) 5 % Place 1 patch onto the skin daily. Remove & Discard patch within 12 hours or as directed by MD 09/16/20   Suella Broad A, PA-C  ?magnesium oxide (MAG-OX) 400 (241.3 Mg) MG tablet Take 400 mg by mouth 2 (two) times daily.  03/11/19   [provider]  ?nitroGLYCERIN (NITROSTAT) 0.4 MG SL tablet Place 1 tablet (0.4 mg  total) under the tongue every 5 (five) minutes as needed for chest pain. Reported on 04/12/2015 06/03/15   Charlott Rakes, MD  ?nystatin (MYCOSTATIN) 100000 UNIT/ML suspension Take 5 mLs by mouth See admin instructions. Swish and swallow 5 ml's by mouth four times a day 02/21/19   [provider]  ?omega-3 acid ethyl esters (LOVAZA) 1 G capsule Take 1 g by mouth daily.     [provider]  ?ondansetron (ZOFRAN) 4 MG tablet Take 1 tablet (4 mg total) every 8 (eight) hours as needed by mouth for nausea or vomiting. Reported on 05/24/2015 11/20/16   Darrick Grinder D, NP  ?oxyCODONE-acetaminophen (PERCOCET/ROXICET) 5-325 MG  tablet Take 1 tablet by mouth every 6 (six) hours as needed for severe pain. 09/07/20   Venter, Margaux, PA-C  ?PRALUENT 75 MG/ML SOAJ INJECT 75 MG INTO THE SKIN EVERY 14 (FOURTEEN) DAYS. 05/01/19   Bensimhon, Shaune Pascal, MD  ?predniSONE (DELTASONE) 5 MG tablet Take 1 tablet by mouth in the morning.  06/18/19   [provider]  ?Semaglutide, 2 MG/DOSE, 8 MG/3ML SOPN Inject 2 mg as directed once a week. 04/07/21   Charlott Rakes, MD  ?   ? ?Allergies    ?Patient has no known allergies.   ? ?Review of Systems   ?Review of Systems  ?Constitutional:  Negative for fever.  ?HENT:  Negative for ear pain and sore throat.   ?Eyes:  Negative for pain.  ?Respiratory:  Negative for cough.   ?Cardiovascular:  Negative for chest pain.  ?Gastrointestinal:  Negative for abdominal pain.  ?Genitourinary:  Negative for flank pain.  ?Musculoskeletal:  Negative for back pain.  ?Skin:  Negative for color change and rash.  ?Neurological:  Negative for syncope.  ?All other systems reviewed and are negative. ? ?Physical Exam ?Updated Vital Signs ?BP (!) 122/95 (BP Location: Right Arm)   Pulse (!) 102   Temp (!) 97.5 ?F (36.4 ?C) (Oral)   Resp 16   SpO2 98%  ?Physical Exam ?Constitutional:   ?   Appearance: He is well-developed.  ?HENT:  ?   Head: Normocephalic.  ?   Nose: Nose normal.  ?Eyes:  ?   Extraocular Movements: Extraocular movements intact.  ?Cardiovascular:  ?   Rate and Rhythm: Normal rate.  ?Pulmonary:  ?   Effort: Pulmonary effort is normal.  ?Skin: ?   Coloration: Skin is not jaundiced.  ?Neurological:  ?   Mental Status: He is alert. Mental status is at baseline.  ? ? ?ED Results / Procedures / Treatments   ?Labs ?(all labs ordered are listed, but only abnormal results are displayed) ?Labs Reviewed  ?BASIC METABOLIC PANEL - Abnormal; Notable for the following components:  ?    Result Value  ? Sodium 121 (*)   ? Potassium 5.4 (*)   ? Chloride 86 (*)   ? CO2 16 (*)   ? Glucose, Bld 1,125 (*)   ? BUN 58 (*)   ?  Creatinine, Ser 2.98 (*)   ? GFR, Estimated 24 (*)   ? Anion gap 19 (*)   ? All other components within normal limits  ?URINALYSIS, ROUTINE W REFLEX MICROSCOPIC - Abnormal; Notable for the following components:  ? Color, Urine STRAW (*)   ? Glucose, UA >=500 (*)   ? Ketones, ur 5 (*)   ? All other components within normal limits  ?BETA-HYDROXYBUTYRIC ACID - Abnormal; Notable for the following components:  ? Beta-Hydroxybutyric Acid 2.36 (*)   ? All  other components within normal limits  ?CBG MONITORING, ED - Abnormal; Notable for the following components:  ? Glucose-Capillary >600 (*)   ? All other components within normal limits  ?I-STAT VENOUS BLOOD GAS, ED - Abnormal; Notable for the following components:  ? pCO2, Ven 34.6 (*)   ? pO2, Ven 129 (*)   ? Bicarbonate 19.3 (*)   ? TCO2 20 (*)   ? Acid-base deficit 5.0 (*)   ? Sodium 120 (*)   ? Potassium 5.4 (*)   ? Calcium, Ion 1.06 (*)   ? All other components within normal limits  ?CBC  ?CBG MONITORING, ED  ? ? ?EKG ?None ? ?Radiology ?No results found. ? ?Procedures ?Marland KitchenCritical Care ?Performed by: Luna Fuse, MD ?Authorized by: Luna Fuse, MD  ? ?Critical care provider statement:  ?  Critical care time (minutes):  40 ?  Critical care time was exclusive of:  Separately billable procedures and treating other patients and teaching time ?  Critical care was necessary to treat or prevent imminent or life-threatening deterioration of the following conditions:  Metabolic crisis  ? ? ?Medications Ordered in ED ?Medications  ?lactated ringers bolus 1,000 mL (has no administration in time range)  ?insulin regular, human (MYXREDLIN) 100 units/ 100 mL infusion (has no administration in time range)  ?lactated ringers infusion (has no administration in time range)  ?dextrose 5 % in lactated ringers infusion (has no administration in time range)  ?dextrose 50 % solution 0-50 mL (has no administration in time range)  ?lactated ringers bolus 1,000 mL (has no administration  in time range)  ? ? ?ED Course/ Medical Decision Making/ A&P ?  ?                        ?Medical Decision Making ?Amount and/or Complexity of Data Reviewed ?Labs: ordered. ? ?Risk ?Prescription drug management. ? ? ?[place]

## 2021-04-22 NOTE — H&P (Signed)
? ? ? ?Date: 04/22/2021     ?     ?     ?Patient Name:  Marvin Martin MRN: [id number]  ?DOB: 08/07/1965 Age / Sex: 56 y.o., male   ?PCP: Charlott Rakes, MD    ?     ?Medical Service: Internal Medicine Teaching Service    ?     ?Attending Physician: Dr. Charise Killian, MD    ?First Contact: Dr. Rosezetta Schlatter, MD Pager: [phone]  ?Second Contact: Dr. Iona Beard, MD Pager: [phone]  ?     ?After Hours (After 5p/  First Contact Pager: [phone]  ?weekends / holidays): Second Contact Pager: [phone]  ? ?Chief Complaint: CBG "high" since Wednesday ? ?History of Present Illness: Marvin Martin is a 56 year old male with PMHx uncontrolled T2DM, HFrEF, CKD Stage III, HLD, and CAD and ischemic cardiomyopathy s/p cardiac transplant who presents with uncontrolled hyperglycemia x 2 days. Seen with brother at bedside ? ?No pain with urination. Sees [facility] endocrinology for diabetes. Out of strips. Marvin Martin checked and was high. Left note to go to the ED.  ? ?BM normal. Drinking a lot of liquids. Denied headache, chest pain, abdominal pain, swelling, or back pain. ? ?Meds:  ?No outpatient medications have been marked as taking for the 04/22/21 encounter [facility] Encounter).  ?Glipizide ?Lantus 10 u since today; previously using as sliding scale taking 2.5 u BID. ?Ozempic weekly ? ? ?Allergies: ?Allergies as of 04/22/2021  ? (No Known Allergies)  ? ?Past Medical History:  ?Diagnosis Date  ? Chronic systolic CHF (congestive heart failure) ([place])   ? a. 01/2015 Echo: EF 20-25%, antlat, inflat AK.  ? CKD (chronic kidney disease), stage III ([place])   ? Coronary artery disease   ? a. s/p MI x 4;  b. Reported h/o 17 stents with recurrent ISR;  c. 02/2014 Cath ([place]): PCI to unknown vessel;  d. 01/2015 MV: EF 18%, large scar, no ischemia; e. 03/2015 PCI: LM nl, LAD nl, D2 patent stent, RI patent stent, LCX patent stents p/m, RCA 95p ISR (PTCA), 55m, 100d into RPL CTO, RPDA 90 (2.5x16 Synergy DES).  ? Diabetes mellitus without complication ([place])   ?  Hyperlipidemia   ? Hypertensive heart disease   ? Ischemic cardiomyopathy   ? a. s/p SJM ICD 2007 w/ gen change in 2012; b. 12/2014 CPX Test: mild to mod HF limitation w/ pVO2 20.3 and nl slope;  c. 01/2015 Echo: EF 20-25%.  ? ? ?Family History: High blood in siblings all 5 children, Sister with cardiac disease, Diabetes ?father, mother with stomach cancer ? ?Social History: Denies alcohol, tobacco, recreational drugs. Nurse comes in 1 time a week to help with medication ? ?Review of Systems: ?A complete ROS was negative except as per HPI.  ? ?Physical Exam: ?Blood pressure (!) 146/97, pulse 60, temperature 98.2 ?F (36.8 ?C), resp. rate (!) 33, SpO2 99 %. ?Physical Exam: ?General: well-appearing middle aged male in NAD ?HENT: normocephalic, atraumatic, external nares and ears appear normal ?EYES: conjunctiva non-erythematous, no scleral icterus ?CV: regular rate, abnormal rhythm; with PVCs on monitoring. ?Pulmonary: normal work of breathing on RA, lungs clear to auscultation bilaterally ?Abdominal: non-distended, soft, non-tender to palpation, normal BS all 4 quadrants ?Skin: Warm and dry; normal skin turgor ?Neurological: Awake, alert, and oriented x3. No focal deficits of CNII-XII. ?Psych: normal affect and behavior  ? ? ?Assessment & Plan by Problem: ?Marvin Martin is a 56 year old male with a PMHx of uncontrolled T2DM, deficits in  medical literacy, s/p cardiac and renal transplants, who presents after home RN informed him that his CBG was extremely elevated which is likely due to lack of patient understanding about insulin dosing.  ? ?Principal Problem: ?  DKA (diabetic ketoacidosis) ([place]) ? ?#T2DM, uncontrolled ?#DKA vs. HHS ?Patient with glucose on BMP 1125, AG 19, BHB 2.36, UA with >500 glucosuria, Ketones 5, VBG with bicarb 19.3. Cr 2.98, 2.1 on 3/29. Patient describes using home Lantus as sliding scale and has no basal dosing. He took 2.5 u BID for at least the past week, until home RN increased to 10u  today. He also takes Ozempic weekly on Wednesdays. He is compliant with medications but has difficulty understanding correct regimen.  ?-1 L bolus LR then continuous LR infusion when CBG <250. ?- Initiate Endotool ? ?#Hyperkalemia ?Expect to decrease with insulin gtt. ?-CTM on BMP ? ?#Hyponatremia on BMP ?Na 121, glucose 1125. Corrected Na 137 ?-CTM on BMP ? ?#s/p Cardiac transplant ?#s/p Renal transplant ?Both organs transplanted in 2021. Follows with [facility] for management. ?- Continue home medications: Prednisone 5 mg qAM, Tacrolimus 4 mg BID, Doxazosin 2 mg qHS, Zetia 10 mg, and Ivabradine 2.5 mg BID with meals. ? ?Other home meds to continue throughout admission: Cholecalciferol 1000 u daily, ASA 81 mg  ? ?Best Practices ?Diet: NPO while on Endotool ?DVT prophylaxis: SQ heparin ?Bowel: N/A at this time ?Code:Full ?Dispo: Admit patient to Observation with expected length of stay less than 2 midnights. ? ?Signed: ?Rosezetta Schlatter, MD ?04/22/2021, 7:15 PM  ?Pager: [phone] ?After 5pm on weekdays and 1pm on weekends: On Call pager: [phone]  ?

## 2021-04-22 NOTE — ED Notes (Signed)
New iv start  insulin and lr continued as prescribed ?

## 2021-04-22 NOTE — ED Notes (Signed)
I have called 6 e and have asked for the purple man to be placed ?

## 2021-04-22 NOTE — ED Notes (Signed)
Blood not drawn hard stick ?

## 2021-04-22 NOTE — ED Notes (Signed)
Don not do bladder scan per admitting doctor ?

## 2021-04-22 NOTE — ED Notes (Signed)
Lab has called with a pot 6.6 that was hemolyzed  we are redrawing  the  2300 bmp with the osmolity ?

## 2021-04-22 NOTE — ED Notes (Signed)
The pt is a  diabetic and his sugar gas been high for 2-3 days  he has difficulty controlling his blood sugars normally.  I dont smell any ketones on his breath  he reports that the iv team usually has to start his ivs    poor vein selection ?

## 2021-04-22 NOTE — ED Notes (Signed)
Iv lt forearm removed infiltrated ?

## 2021-04-22 NOTE — ED Provider Triage Note (Signed)
Emergency Medicine Provider Triage Evaluation Note ? ?Marvin Martin , a 56 y.o. male  was evaluated in triage.  Pt complains of hyperglycemia "high" reading since Wednesday. The patient is a heart and kidney transplant patient with [facility] 2 years ago, in ICU in December/January for rejection symptoms. The patient has a Home Health nurse that was taking care of him and measured >600. He reports compliancy with his medications. He reports fatigue, but no other problems. ? ?Review of Systems  ?Positive:  ?Negative:  ? ?Physical Exam  ?BP (!) 122/95 (BP Location: Right Arm)   Pulse (!) 102   Temp (!) 97.5 ?F (36.4 ?C) (Oral)   Resp 16   SpO2 98%  ?Gen:   Awake, no distress   ?Resp:  Normal effort  ?MSK:   Moves extremities without difficulty  ?Other:   ? ?Medical Decision Making  ?Medically screening exam initiated at 1:31 PM.  Appropriate orders placed.  Marvin Martin was informed that the remainder of the evaluation will be completed by another provider, this initial triage assessment does not replace that evaluation, and the importance of remaining in the ED until their evaluation is complete. ? ?DKA order set placed. CBG >600 here.  ?  ?Sherrell Puller, PA-C ?04/22/21 1334 ? ?

## 2021-04-22 NOTE — ED Notes (Signed)
Cbg read high  insulin drip stopped diue to an infiltratye ?

## 2021-04-22 NOTE — ED Triage Notes (Signed)
Patient here with of hyperglycemia, reports CBG at home reading "high" since Wednesday. Patient reports dizziness, is alert, oriented, and in no apparent distress at this time. ?

## 2021-04-22 NOTE — ED Notes (Signed)
Edp aware of iv team coming ?

## 2021-04-23 ENCOUNTER — Other Ambulatory Visit: Payer: Self-pay

## 2021-04-23 ENCOUNTER — Encounter (HOSPITAL_COMMUNITY): Payer: Self-pay | Admitting: Internal Medicine

## 2021-04-23 DIAGNOSIS — E860 Dehydration: Secondary | ICD-10-CM | POA: Diagnosis not present

## 2021-04-23 DIAGNOSIS — E1122 Type 2 diabetes mellitus with diabetic chronic kidney disease: Secondary | ICD-10-CM | POA: Diagnosis not present

## 2021-04-23 DIAGNOSIS — Z20822 Contact with and (suspected) exposure to covid-19: Secondary | ICD-10-CM | POA: Diagnosis not present

## 2021-04-23 DIAGNOSIS — E871 Hypo-osmolality and hyponatremia: Secondary | ICD-10-CM | POA: Diagnosis not present

## 2021-04-23 DIAGNOSIS — E1110 Type 2 diabetes mellitus with ketoacidosis without coma: Secondary | ICD-10-CM

## 2021-04-23 LAB — CBC
HCT: 36.7 % — ABNORMAL LOW (ref 39.0–52.0)
Hemoglobin: 13.6 g/dL (ref 13.0–17.0)
MCH: 32.2 pg (ref 26.0–34.0)
MCHC: 37.1 g/dL — ABNORMAL HIGH (ref 30.0–36.0)
MCV: 87 fL (ref 80.0–100.0)
Platelets: 182 10*3/uL (ref 150–400)
RBC: 4.22 MIL/uL (ref 4.22–5.81)
RDW: 12.2 % (ref 11.5–15.5)
WBC: 7 10*3/uL (ref 4.0–10.5)
nRBC: 0 % (ref 0.0–0.2)

## 2021-04-23 LAB — BETA-HYDROXYBUTYRIC ACID: Beta-Hydroxybutyric Acid: 0.19 mmol/L (ref 0.05–0.27)

## 2021-04-23 LAB — BASIC METABOLIC PANEL
Anion gap: 10 (ref 5–15)
Anion gap: 9 (ref 5–15)
Anion gap: 8 (ref 5–15)
BUN: 45 mg/dL — ABNORMAL HIGH (ref 6–20)
BUN: 44 mg/dL — ABNORMAL HIGH (ref 6–20)
BUN: 39 mg/dL — ABNORMAL HIGH (ref 6–20)
CO2: 24 mmol/L (ref 22–32)
CO2: 20 mmol/L — ABNORMAL LOW (ref 22–32)
CO2: 21 mmol/L — ABNORMAL LOW (ref 22–32)
Calcium: 9.8 mg/dL (ref 8.9–10.3)
Calcium: 9.3 mg/dL (ref 8.9–10.3)
Calcium: 9 mg/dL (ref 8.9–10.3)
Chloride: 103 mmol/L (ref 98–111)
Chloride: 106 mmol/L (ref 98–111)
Chloride: 105 mmol/L (ref 98–111)
Creatinine, Ser: 2.05 mg/dL — ABNORMAL HIGH (ref 0.61–1.24)
Creatinine, Ser: 2.16 mg/dL — ABNORMAL HIGH (ref 0.61–1.24)
Creatinine, Ser: 2.2 mg/dL — ABNORMAL HIGH (ref 0.61–1.24)
GFR, Estimated: 37 mL/min — ABNORMAL LOW (ref >60)
GFR, Estimated: 35 mL/min — ABNORMAL LOW (ref >60)
GFR, Estimated: 34 mL/min — ABNORMAL LOW (ref >60)
Glucose, Bld: 141 mg/dL — ABNORMAL HIGH (ref 70–99)
Glucose, Bld: 227 mg/dL — ABNORMAL HIGH (ref 70–99)
Glucose, Bld: 301 mg/dL — ABNORMAL HIGH (ref 70–99)
Potassium: 2.9 mmol/L — ABNORMAL LOW (ref 3.5–5.1)
Potassium: 5.6 mmol/L — ABNORMAL HIGH (ref 3.5–5.1)
Potassium: 4.9 mmol/L (ref 3.5–5.1)
Sodium: 137 mmol/L (ref 135–145)
Sodium: 135 mmol/L (ref 135–145)
Sodium: 134 mmol/L — ABNORMAL LOW (ref 135–145)

## 2021-04-23 LAB — OSMOLALITY: Osmolality: 298 mOsm/kg — ABNORMAL HIGH (ref 275–295)

## 2021-04-23 LAB — HEMOGLOBIN A1C
Hgb A1c MFr Bld: 11.6 % — ABNORMAL HIGH (ref 4.8–5.6)
Mean Plasma Glucose: 286.22 mg/dL

## 2021-04-23 LAB — GLUCOSE, CAPILLARY
Glucose-Capillary: 132 mg/dL — ABNORMAL HIGH (ref 70–99)
Glucose-Capillary: 138 mg/dL — ABNORMAL HIGH (ref 70–99)
Glucose-Capillary: 151 mg/dL — ABNORMAL HIGH (ref 70–99)
Glucose-Capillary: 160 mg/dL — ABNORMAL HIGH (ref 70–99)
Glucose-Capillary: 174 mg/dL — ABNORMAL HIGH (ref 70–99)
Glucose-Capillary: 195 mg/dL — ABNORMAL HIGH (ref 70–99)
Glucose-Capillary: 204 mg/dL — ABNORMAL HIGH (ref 70–99)
Glucose-Capillary: 214 mg/dL — ABNORMAL HIGH (ref 70–99)
Glucose-Capillary: 221 mg/dL — ABNORMAL HIGH (ref 70–99)
Glucose-Capillary: 284 mg/dL — ABNORMAL HIGH (ref 70–99)
Glucose-Capillary: 296 mg/dL — ABNORMAL HIGH (ref 70–99)

## 2021-04-23 MED ORDER — INSULIN GLARGINE-YFGN 100 UNIT/ML ~~LOC~~ SOLN
15.0000 [IU] | Freq: Once | SUBCUTANEOUS | Status: AC
  Administered 2021-04-23: 15 [IU] via SUBCUTANEOUS
  Filled 2021-04-23: qty 0.15

## 2021-04-23 MED ORDER — INSULIN GLARGINE-YFGN 100 UNIT/ML ~~LOC~~ SOLN
15.0000 [IU] | Freq: Every day | SUBCUTANEOUS | Status: DC
  Filled 2021-04-23: qty 0.15

## 2021-04-23 MED ORDER — DEXTROSE 50 % IV SOLN: 0.0000 mL | INTRAVENOUS | Status: DC | PRN

## 2021-04-23 MED ORDER — SODIUM CHLORIDE 0.9 % IV SOLN: INTRAVENOUS | Status: DC | PRN

## 2021-04-23 MED ORDER — INSULIN REGULAR(HUMAN) IN NACL 100-0.9 UT/100ML-% IV SOLN
INTRAVENOUS | Status: DC
  Administered 2021-04-23: 4.4 [IU]/h via INTRAVENOUS

## 2021-04-23 MED ORDER — INSULIN ASPART 100 UNIT/ML IJ SOLN: 0.0000 [IU] | Freq: Every day | INTRAMUSCULAR | Status: DC

## 2021-04-23 MED ORDER — LACTATED RINGERS IV SOLN: INTRAVENOUS | Status: DC

## 2021-04-23 MED ORDER — INSULIN ASPART 100 UNIT/ML IJ SOLN
0.0000 [IU] | Freq: Three times a day (TID) | INTRAMUSCULAR | Status: DC
  Administered 2021-04-23: 3 [IU] via SUBCUTANEOUS
  Administered 2021-04-23: 8 [IU] via SUBCUTANEOUS
  Administered 2021-04-24: 3 [IU] via SUBCUTANEOUS
  Administered 2021-04-24: 5 [IU] via SUBCUTANEOUS

## 2021-04-23 MED ORDER — INSULIN REGULAR(HUMAN) IN NACL 100-0.9 UT/100ML-% IV SOLN: INTRAVENOUS | Status: DC

## 2021-04-23 MED ORDER — POTASSIUM CHLORIDE 10 MEQ/100ML IV SOLN
10.0000 meq | INTRAVENOUS | Status: AC
  Administered 2021-04-23 (×2): 10 meq via INTRAVENOUS
  Filled 2021-04-23 (×2): qty 100

## 2021-04-23 MED ORDER — POTASSIUM CHLORIDE 20 MEQ PO PACK
40.0000 meq | PACK | ORAL | Status: AC
  Administered 2021-04-23 (×2): 40 meq via ORAL
  Filled 2021-04-23 (×2): qty 2

## 2021-04-23 MED ORDER — DEXTROSE IN LACTATED RINGERS 5 % IV SOLN: INTRAVENOUS | Status: DC

## 2021-04-23 NOTE — Care Management CC44 (Signed)
Condition Code 44 Documentation Completed ? ?Patient Details  ?Name: Marvin Martin ?MRN: [id number] ?Date of Birth: 30-Sep-1965 ? ? ?Condition Code 44 given:  Yes ?Patient signature on Condition Code 44 notice:  Yes ?Documentation of 2 MD's agreement:  Yes ?Code 44 added to claim:  Yes ? ? ? ?Konrad Penta, RN ?04/23/2021, 4:40 PM ? ?

## 2021-04-23 NOTE — Care Management Obs Status (Signed)
MEDICARE OBSERVATION STATUS NOTIFICATION ? ? ?Patient Details  ?Name: Marvin Martin ?MRN: [id number] ?Date of Birth: 06-Dec-1965 ? ? ?Medicare Observation Status Notification Given:  Yes ? ? ? ?Konrad Penta, RN ?04/23/2021, 4:40 PM ?

## 2021-04-23 NOTE — Progress Notes (Signed)
? ?  Subjective: NAEON. ? ?Patient reports feeling well this morning.  He denies nausea, vomiting, headache, lightheadedness, dizziness, chest pain, and shortness of breath.  He reports that he has not had an appetite for the past couple of days, and is unsure if he can eat today.  We advised he can advance his diet as tolerated, with which he was agreeable. ? ?No acute concerns or complaints today. ? ?Objective: ? ?Vital signs in last 24 hours: ?Vitals:  ? 04/22/21 1846 04/22/21 2120 04/23/21 0007 04/23/21 0522  ?BP:  (!) 145/107 (!) 119/95 (!) 125/95  ?Pulse:  (!) 106 96 98  ?Resp:  (!) 22 19 15   ?Temp: 98.2 ?F (36.8 ?C) 98.4 ?F (36.9 ?C) 97.7 ?F (36.5 ?C) (!) 97.5 ?F (36.4 ?C)  ?TempSrc:  Axillary Oral Oral  ?SpO2:  100% 98% 98%  ?Weight:  57.7 kg    ?Height:  5\' 7"  (1.702 m)    ? ?General: well-appearing middle aged male in NAD ?HENT: normocephalic, atraumatic, external nares and ears appear normal ?EYES: conjunctiva non-erythematous, no scleral icterus ?CV: regular rate, abnormal rhythm; with PVCs on monitoring. ?Pulmonary: normal work of breathing on RA, lungs clear to auscultation bilaterally ?Abdominal: non-distended, soft, non-tender to palpation, normal BS all 4 quadrants ?Skin: Warm and dry; normal skin turgor ?Neurological: Awake, alert, and oriented x3. No focal deficits of CNII-XII. ?Psych: normal affect and behavior  ? ?Assessment/Plan: ?Marvin Martin is a 56 year old male with a PMHx of uncontrolled T2DM, deficits in medical literacy, s/p cardiac and renal transplants, who presents after home RN informed him that his CBG was extremely elevated which is likely due to lack of patient understanding about insulin dosing.  ? ?Principal Problem: ?  DKA (diabetic ketoacidosis) ([place]) ? ?#T2DM, uncontrolled ?#DKA ?Patient with AM glucose on admission BMP 1125, AG 19, BHB 2.36.  Serum osmolality 298.  Placed on Endo tool, and CBG this a.m. 174. AG closed x 2, BHB 0.19. Patient describes using home Lantus as  sliding scale and has no basal dosing. He took 2.5 u BID for at least the past week. He also takes Ozempic weekly on Wednesdays. He is compliant with medications but has difficulty understanding correct regimen.  ?- fluid resuscitation with D5 and LR now that CBG is less than 250 ?- Endotool complete; transitioning off and initiating Lantus 15 units ?- Moderate SSI ?- Advance diet as tolerated ?  ?#Hyperkalemia ?Expect to decrease with insulin gtt. ?-CTM on BMP ?  ?#Hyponatremia on BMP, resolved ?Na 121, glucose 1125. Corrected Na 137 ?-CTM on BMP ?  ?#s/p Cardiac transplant ?#s/p Renal transplant ?Both organs transplanted in 2021. Follows with [facility] for management. ?- Continue home medications: Prednisone 5 mg qAM, Tacrolimus 4 mg BID, Doxazosin 2 mg qHS, Zetia 10 mg, and Ivabradine 2.5 mg BID with meals. ?  ?Other home meds to continue throughout admission: Cholecalciferol 1000 u daily, ASA 81 mg  ?  ? ?Prior to Admission Living Arrangement: ?Anticipated Discharge Location: ?Barriers to Discharge: ?Dispo: Anticipated discharge in approximately 1-2 day(s).  ? Rosezetta Schlatter, MD ?04/23/2021, 10:29 AM ?Pager: [phone] ?After 5pm on weekdays and 1pm on weekends: On Call pager [phone]  ?

## 2021-04-24 LAB — BASIC METABOLIC PANEL
Anion gap: 7 (ref 5–15)
BUN: 37 mg/dL — ABNORMAL HIGH (ref 6–20)
CO2: 23 mmol/L (ref 22–32)
Calcium: 8.9 mg/dL (ref 8.9–10.3)
Chloride: 105 mmol/L (ref 98–111)
Creatinine, Ser: 2.19 mg/dL — ABNORMAL HIGH (ref 0.61–1.24)
GFR, Estimated: 34 mL/min — ABNORMAL LOW (ref >60)
Glucose, Bld: 228 mg/dL — ABNORMAL HIGH (ref 70–99)
Potassium: 4.6 mmol/L (ref 3.5–5.1)
Sodium: 135 mmol/L (ref 135–145)

## 2021-04-24 LAB — GLUCOSE, CAPILLARY
Glucose-Capillary: 217 mg/dL — ABNORMAL HIGH (ref 70–99)
Glucose-Capillary: 198 mg/dL — ABNORMAL HIGH (ref 70–99)
Glucose-Capillary: 215 mg/dL — ABNORMAL HIGH (ref 70–99)

## 2021-04-24 MED ORDER — INSULIN GLARGINE-YFGN 100 UNIT/ML ~~LOC~~ SOLN
15.0000 [IU] | Freq: Once | SUBCUTANEOUS | Status: AC
  Administered 2021-04-24: 15 [IU] via SUBCUTANEOUS
  Filled 2021-04-24: qty 0.15

## 2021-04-24 MED ORDER — MYCOPHENOLATE MOFETIL 250 MG PO CAPS
1000.0000 mg | ORAL_CAPSULE | Freq: Two times a day (BID) | ORAL | Status: DC
  Administered 2021-04-24: 1000 mg via ORAL
  Filled 2021-04-24 (×2): qty 4

## 2021-04-24 MED ORDER — INSULIN GLARGINE-YFGN 100 UNIT/ML ~~LOC~~ SOLN
15.0000 [IU] | Freq: Every day | SUBCUTANEOUS | Status: DC
  Filled 2021-04-24: qty 0.15

## 2021-04-24 NOTE — TOC Transition Note (Signed)
Transition of Care (TOC) - CM/SW Discharge Note ?Marvetta Gibbons Therapist, sports, BSN ?Transitions of Care ?Unit 4E- RN Case Manager ?See Treatment Team for direct phone #  ?Weekend cross coverage ? ?Patient Details  ?Name: Marvin Martin ?MRN: [id number] ?Date of Birth: October 08, 1965 ? ?Transition of Care (TOC) CM/SW Contact:  ?Dahlia Client, Romeo Rabon, RN ?Phone Number: ?04/24/2021, 1:14 PM ? ? ?Clinical Narrative:    ?Pt stable for transition home today. HH orders placed. CM in to speak with pt- per pt he is active with [facility] services already. Per Patient Pearletha Forge review pt is active with [facility]- call made to [facility] to confirm services- RN.  ?Per conversation with pt is is happy with [facility] and would like to continue services with them- he reports his nurse usually comes on Wednesdays.  ? ?CM has notified [name] with [facility] for resumption of [name] needs- [facility] will resume [facility] services for continued needs.  ? ? ? ? ?Final next level of care: [place] ?Barriers to Discharge: No Barriers Identified ? ? ?Patient Goals and CMS Choice ?Patient states their goals for this hospitalization and ongoing recovery are:: return home ?CMS Medicare.gov Compare Post Acute Care list provided to:: Patient ?Choice offered to / list presented to : Patient ? ?Discharge Placement ?  ?           ? Home w/ HH ?  ?  ?  ? ?Discharge Plan and Services ?  ?Discharge Planning Services: CM Consult ?Post Acute Care Choice: Home Health, Resumption of [name]          ?DME Arranged: N/A ?DME Agency: NA ?  ?  ?  ?HH Arranged: PT, Disease Management, RN ?[place] Agency: [place] ?Date HH Agency Contacted: 04/24/21 ?Time [place]: [id number] ?Representative spoke with at [place]: [name] ? ?Social Determinants of Health (SDOH) Interventions ?  ? ? ?Readmission Risk Interventions ? ?  04/24/2021  ?  1:14 PM  ?Readmission Risk Prevention Plan  ?Transportation Screening Complete  ?PCP or Specialist Appt within 5-7 Days Complete  ?Home Care  Screening Complete  ?Medication Review (RN CM) Complete  ? ? ? ? ? ?

## 2021-04-24 NOTE — Discharge Summary (Addendum)
? ?Name: Marvin Martin ?MRN: [id number] ?DOB: 03/25/1965 56 y.o. ?PCP: Charlott Rakes, MD ? ?Date of Admission: 04/22/2021 12:47 PM ?Date of Discharge: No discharge date for patient encounter. ?Attending Physician: Charise Killian, MD ? ?Discharge Diagnosis: ?1.DKA ?2. Uncontrolled Diabetes mellitus Type 2 ?3. AKI on CKD stage IIIb ? ?Discharge Medications: ?Allergies as of 04/24/2021   ?No Known Allergies ?  ? ?  ?Medication List  ?  ? ?STOP taking these medications   ? ?carvedilol 12.5 MG tablet ?Commonly known as: COREG ?  ?doxazosin 2 MG tablet ?Commonly known as: CARDURA ?  ?glipiZIDE 2.5 MG 24 hr tablet ?Commonly known as: GLUCOTROL XL ?  ? ?  ? ?TAKE these medications   ? ?acetaminophen 325 MG tablet ?Commonly known as: TYLENOL ?Take 650 mg by mouth every 6 (six) hours as needed for mild pain or headache. ?  ?amLODipine 5 MG tablet ?Commonly known as: NORVASC ?Take 5 mg by mouth daily. ?  ?aspirin 81 MG tablet ?Take 81 mg by mouth daily. ?  ?Calcium Carbonate-Vitamin D 600-400 MG-UNIT tablet ?Take 1 tablet by mouth 2 (two) times daily. ?  ?cholecalciferol 1000 units tablet ?Commonly known as: VITAMIN D ?Take 1,000 Units by mouth daily. ?  ?Corlanor 5 MG Tabs tablet ?Generic drug: ivabradine ?Take 5 mg by mouth daily. ?  ?diclofenac Sodium 1 % Gel ?Commonly known as: Voltaren ?Apply 4 g topically 4 (four) times daily. ?What changed:  ?when to take this ?reasons to take this ?  ?ezetimibe 10 MG tablet ?Commonly known as: ZETIA ?Take 1 tablet (10 mg total) by mouth daily. Needs appt for further refills ?What changed: additional instructions ?  ?FreeStyle Libre 2 Reader Kerrin Mo ?3 Units by Does not apply route in the morning, at noon, and at bedtime. ?  ?FreeStyle Libre 2 Sensor Misc ?3 Units by Does not apply route in the morning, at noon, and at bedtime. ?  ?HYDROcodone-acetaminophen 5-325 MG tablet ?Commonly known as: NORCO/VICODIN ?Take 1 tablet by mouth every 4 (four) hours as needed. ?What changed: reasons to take  this ?  ?Insulin Pen Needle 32G X 4 MM Misc ?1 application by Does not apply route 4 (four) times daily. ?  ?isosorbide mononitrate 30 MG 24 hr tablet ?Commonly known as: IMDUR ?TAKE 1 TABLET BY MOUTH EVERY DAY ?  ?Lantus SoloStar 100 UNIT/ML Solostar Pen ?Generic drug: insulin glargine ?Inject 20 Units into the skin daily. ?  ?lidocaine 5 % ?Commonly known as: Lidoderm ?Place 1 patch onto the skin daily. Remove & Discard patch within 12 hours or as directed by MD ?What changed:  ?when to take this ?reasons to take this ?  ?mycophenolate 500 MG tablet ?Commonly known as: CELLCEPT ?Take 1,000 mg by mouth in the morning and at bedtime. ?  ?nitroGLYCERIN 0.4 MG SL tablet ?Commonly known as: NITROSTAT ?Place 1 tablet (0.4 mg total) under the tongue every 5 (five) minutes as needed for chest pain. Reported on 04/12/2015 ?  ?nystatin 100000 UNIT/ML suspension ?Commonly known as: MYCOSTATIN ?Take 5 mLs by mouth in the morning, at noon, in the evening, and at bedtime. ?  ?ondansetron 4 MG tablet ?Commonly known as: ZOFRAN ?Take 1 tablet (4 mg total) every 8 (eight) hours as needed by mouth for nausea or vomiting. Reported on 05/24/2015 ?  ?oxyCODONE-acetaminophen 5-325 MG tablet ?Commonly known as: PERCOCET/ROXICET ?Take 1 tablet by mouth every 6 (six) hours as needed for severe pain. ?  ?pantoprazole 40 MG tablet ?Commonly known as: PROTONIX ?Take  40 mg by mouth in the morning and at bedtime. ?  ?Praluent 75 MG/ML Soaj ?Generic drug: Alirocumab ?INJECT 75 MG INTO THE SKIN EVERY 14 (FOURTEEN) DAYS. ?  ?predniSONE 5 MG tablet ?Commonly known as: DELTASONE ?Take 5 mg by mouth in the morning. Continuous ?  ?rosuvastatin 10 MG tablet ?Commonly known as: CRESTOR ?Take 10 mg by mouth daily. ?  ?Semaglutide (2 MG/DOSE) 8 MG/3ML Sopn ?Inject 2 mg as directed once a week. ?What changed: when to take this ?  ?senna 8.6 MG tablet ?Commonly known as: SENOKOT ?Take 2 tablets by mouth daily as needed for constipation. ?  ?tacrolimus 1 MG  capsule ?Commonly known as: PROGRAF ?Take 4 mg by mouth every 12 (twelve) hours. ?  ? ?  ? ? ?Disposition and follow-up:   ?MarvinQuincey Martin was discharged from [facility] in Stable condition.  At the hospital follow up visit please address: ? ?1.  DKA-admitted with DKA reports he has been using Lantus 2.5 units daily rather than 20 units.  Sugars doing well after insulin infusion.  Diabetes education provided.  Titrate insulin dosage and other additional diabetic agents. ? ?2.  Labs / imaging needed at time of follow-up:CBG ? ?3.  Pending labs/ test needing follow-up: None ? ?Follow-up Appointments: ? Follow-up Information   ? ? Care, [facility] Follow up.   ?Specialty: Home Health Services ?Why: HHRN- services to resume on discharge ?Contact information: ?[address] ?[address] ?[place] [address] ?[phone] ? ? ?  ?  ? ? Charlott Rakes, MD. Schedule an appointment as soon as possible for a visit in 1 week(s).   ?Specialty: Family Medicine ?Contact information: ?[address] ?[address] ?[place] [address] ?[phone] ? ? ?  ?  ? ?  ?  ? ?  ? ? ?Hospital Course by problem list: ? ?Marvin Martin is a 56 year old male with a PMHx of uncontrolled T2DM, deficits in medical literacy, s/p cardiac and renal transplants, who presents after home for hyperglycemia and resulting DKA secondary to correct insulin dosing. ? ?#T2DM, uncontrolled ?#Diabetic ketoacidosis ?Patient with glucose on BMP 1125, AG 19, BHB 2.36, UA with >500 glucosuria, Ketones 5, VBG with bicarb 19.3. Cr 2.98, 2.1 on admission. Patient describes using home Lantus as sliding scale and has no basal dosing.  He reports taking Lantus 2.5 units twice daily per instructions with home health RN.  Appears he has had similar difficulties with insulin regimen at his recent visit with PCP at [facility] on 3/23.  Regimen includes Lantus 20 units daily, Ozempic 2 mg weekly.  Was prescribed  glipizide by endocrinologist at [facility] in February but has not started this because he never received from pharmacy.  His PCP held this for concern for hypoglycemia.  A1c this admission was 11.6.  Was placed on Endo tool with IV fluids and insulin infusion.  Anion gap closed and he was transitioned to subcutaneous Semglee 15 units.  His CBGs improved to 198 on day of discharge.  Patient eating and drinking well.  Bicarb improved to 23.  Potassium normalized to 4.7.  Sodium 135.  And anion gap of 7 on day of discharge.  Discharged home on home Ozempic and Lantus 20 units daily.  Diabetes coordinator evaluated patient and provided education on insulin administration and dosage to patient.  He also has home health RN set up that will be continued at discharge to help with medication management as well.  Will need close follow-up with primary care provider Dr. Margarita Rana at [facility] and wellness clinic.  Patient instructed to call clinic on Monday to make a follow-up in 1 week.  May need further adjustment in diabetes medications. ? ?AKI on CKD stage IIIb ?Status post renal transplant in 2021.  Follows with [facility] transplant center.  Baseline creatinine of 2.1.  On admission creatinine was 2.98 with BUN of 58.  Likely in the setting of acute DKA.  Improved with IV fluids and insulin to 2.19 on day of discharge. ? ?#s/p Cardiac transplant ?#s/p Renal transplant ?Both organs transplanted in 2021. Follows with [facility]. Continued on home medications: Prednisone 5 mg qAM, Cellcept 1000 mg BID Tacrolimus 4 mg BID.  Continues to to get treatment with IVIG and inhalers for transplant rejection at [facility].  Next treatment in mid April. ?  ?Subjective: ?Patient examined at bedside. States is is feeling well. Ate breakfast yesterday but not much appetite this morning and didn't have lunch or dinner yesterday. Denies nausea and vomiting.  ? ?Discharge Exam:   ?BP 114/88 (BP Location: Left Arm)   Pulse 93   Temp  97.6 ?F (36.4 ?C) (Oral)   Resp 20   Ht 5\' 7"  (1.702 m)   Wt 60.5 kg   SpO2 99%   BMI 20.88 kg/m?  ?Discharge exam:  ?Constitutional: Appears well-developed and well-nourished. No distress.  ?HENT: Normocephalic an

## 2021-04-24 NOTE — Discharge Instructions (Signed)
Dear Marvin Martin, ?It is a pleasure taking care of you in the hospital.  You were admitted to the hospital for elevated sugar levels leading to diabetic ketoacidosis.  Your blood sugars have improved with insulin infusion. ? ?Please ask use Lantus 20 units every night for diabetes in addition to Ozempic once a week on Wednesdays. ? ?Follow-up with your primary care doctor in 1 week.  Please call community health and wellness clinic on Monday to schedule an appointment.  If your blood sugars are elevated or low please call your primary care office or your endocrinologist office for further instructions. ? ? ?

## 2021-04-24 NOTE — Progress Notes (Signed)
Inpatient Diabetes Program Recommendations ? ?AACE/ADA: New Consensus Statement on Inpatient Glycemic Control (2015) ? ?Target Ranges:  Prepandial:   less than 140 mg/dL ?     Peak postprandial:   less than 180 mg/dL (1-2 hours) ?     Critically ill patients:  140 - 180 mg/dL  ? ?Lab Results  ?Component Value Date  ? GLUCAP 198 (H) 04/24/2021  ? HGBA1C 11.6 (H) 04/23/2021  ? ? ?Review of Glycemic Control ? Latest Reference Range & Units 04/23/21 16:54 04/23/21 21:35 04/24/21 02:49 04/24/21 08:15  ?Glucose-Capillary 70 - 99 mg/dL 284 (H) 296 (H) 217 (H) 198 (H)  ?(H): Data is abnormally high ? ?Diabetes history: DM2 ?Outpatient Diabetes medications: Lantus 2.5 QAM, Ozempic ?Followed by Endo at [facility] be taking lantus  20 units QD ?Current orders for Inpatient glycemic control: Semglee 15 units QHS, Novolog 0-15 units TID and prednisone 5 mg qd ? ?Spoke with patient over the phone.  This DM coordinator is working remotely from home over the weekend.   ? ?Admitted with DKA.  Current with [facility].  Sees endo at [facility]. Last visit was in February.  He states his [place] told him to inject 2.5 units of Lantus daily.  Asked him if he was referring to the 2 shot air shot and he said he does that as well.  He administers in his abdomen; he rotates sites appropriately.  He checks his blood sugar fasting and mid afternoon.  He states his blood glucose is 120-180's.   ? ?Reviewed patient's current A1c of 11.6% (average BG of 286 mg/dL).  Explained what a A1c is and what it measures. Also reviewed goal A1c with patient, importance of good glucose control @ home, and blood sugar goals. ? ?Explained he will be discharging on an increased dose of Lantus.  We reviewed hypoglycemia, signs, symptoms and treatments.   ? ?Will continue to follow while inpatient. ? ?Thank you, ?Reche Dixon, MSN, RN ?Diabetes Coordinator ?Inpatient Diabetes Program ?[phone] (team pager from 8a-5p) ? ? ? ? ? ? ? ? ?

## 2021-04-25 ENCOUNTER — Telehealth: Payer: Self-pay

## 2021-04-25 NOTE — Telephone Encounter (Signed)
From the discharge call: ? ? He said he feels sluggish but is trying to " bounce back."  ? ?he has a [employer] but is not sure how to use it.  Scheduled him to see Benard Halsted, [facility] - 05/16/2021 and instructed him to bring the machine and supplies with him to the appt. He also has a BP monitor and standard glucometer.  ? ?he said he has all medications and did not have any questions about the med regime.  ? ?Scheduled to see Dr Margarita Rana - 05/09/2021.  ? ?

## 2021-04-25 NOTE — Telephone Encounter (Signed)
Transition Care Management Follow-up Telephone Call ?Date of discharge and from where: 04/24/2021, [facility]  ?How have you been since you were released from the hospital? He said he feels sluggish but is trying to " bounce back."  ?Any questions or concerns? Yes- he has a [employer] but is not sure how to use it.  Scheduled him to see Benard Halsted, [facility] - 05/16/2021 and instructed him to bring the machine and supplies with him to the appt.  ? ?Items Reviewed: ?Did the pt receive and understand the discharge instructions provided? Yes  ?Medications obtained and verified? Yes  - he said he has all medications and did not have any questions about the med regime.  ?Other? No  ?Any new allergies since your discharge? No  ?Dietary orders reviewed? No ?Do you have support at home?  Lives alone  ? ?Home Care and Equipment/Supplies: ?Were home health services ordered? yes ?If so, what is the name of the agency? [facility]  ?Has the agency set up a time to come to the patient's home? Yes - they are coming tomorrow or the following day.  ?Were any new equipment or medical supplies ordered?  No ?What is the name of the medical supply agency? N/a ?Were you able to get the supplies/equipment? not applicable ?Do you have any questions related to the use of the equipment or supplies? No ? ?Functional Questionnaire: (I = Independent and D = Dependent) ?ADLs: independent but has a cane to use if needed.  ? ? ?Follow up appointments reviewed: ? ?PCP Hospital f/u appt confirmed? Yes  Scheduled to see Dr Margarita Rana - 05/09/2021.  ?[facility] f/u appt confirmed?  None scheduled at this time.    ?Are transportation arrangements needed? No  ?If their condition worsens, is the pt aware to call PCP or go to the Emergency Dept.? Yes ?Was the patient provided with contact information for the PCP's office or ED? Yes ?Was to pt encouraged to call back with questions or concerns? Yes ? ?

## 2021-05-09 ENCOUNTER — Encounter: Payer: Self-pay | Admitting: Family Medicine

## 2021-05-09 ENCOUNTER — Ambulatory Visit: Payer: Medicare Other | Attending: Family Medicine | Admitting: Family Medicine

## 2021-05-09 ENCOUNTER — Other Ambulatory Visit: Payer: Self-pay | Admitting: Family Medicine

## 2021-05-09 ENCOUNTER — Telehealth: Payer: Self-pay

## 2021-05-09 VITALS — BP 118/85 | HR 107 | Ht 67.0 in | Wt 131.6 lb

## 2021-05-09 DIAGNOSIS — R682 Dry mouth, unspecified: Secondary | ICD-10-CM

## 2021-05-09 DIAGNOSIS — Z23 Encounter for immunization: Secondary | ICD-10-CM

## 2021-05-09 DIAGNOSIS — E1165 Type 2 diabetes mellitus with hyperglycemia: Secondary | ICD-10-CM

## 2021-05-09 DIAGNOSIS — E1169 Type 2 diabetes mellitus with other specified complication: Secondary | ICD-10-CM | POA: Diagnosis not present

## 2021-05-09 DIAGNOSIS — Z794 Long term (current) use of insulin: Secondary | ICD-10-CM | POA: Diagnosis not present

## 2021-05-09 LAB — GLUCOSE, POCT (MANUAL RESULT ENTRY): POC Glucose: 304 mg/dl — AB (ref 70–99)

## 2021-05-09 MED ORDER — INSULIN ASPART 100 UNIT/ML IJ SOLN: 0.0000 [IU] | Freq: Three times a day (TID) | INTRAMUSCULAR | 3 refills | Status: DC

## 2021-05-09 MED ORDER — ZOSTER VAC RECOMB ADJUVANTED 50 MCG/0.5ML IM SUSR: 0.5000 mL | Freq: Once | INTRAMUSCULAR | 1 refills | Status: AC

## 2021-05-09 MED ORDER — NOVOLOG FLEXPEN 100 UNIT/ML ~~LOC~~ SOPN: PEN_INJECTOR | SUBCUTANEOUS | 3 refills | Status: DC

## 2021-05-09 MED ORDER — INSULIN ASPART 100 UNIT/ML IJ SOLN
4.0000 [IU] | Freq: Once | INTRAMUSCULAR | Status: AC
  Administered 2021-05-09: 4 [IU] via SUBCUTANEOUS

## 2021-05-09 NOTE — Patient Instructions (Signed)
Insulin Aspart Injection ?What is this medication? ?INSULIN ASPART (IN su lin AS part) treats diabetes. It works by increasing insulin levels in your body, which decreases your blood sugar (glucose). It belongs to a group of medications called rapid-acting insulins. Changes to diet and exercise are often combined with this medication. ?This medicine may be used for other purposes; ask your health care provider or pharmacist if you have questions. ?COMMON BRAND NAME(S): Fiasp, [employer], Medtronic, NovoLog, NovoLog Flexpen, NovoLog PenFill ?What should I tell my care team before I take this medication? ?They need to know if you have any of these conditions: ?Episodes of low blood sugar ?Eye disease, vision problems ?Kidney disease ?Liver disease ?An unusual or allergic reaction to insulin, metacresol, other medications, foods, dyes, or preservatives ?Pregnant or trying to get pregnant ?Breast-feeding ?How should I use this medication? ?This medication is for injection under the skin. Use exactly as directed. It is important to follow the directions given to you by your care team. If you are using Novolog, you should start your meal within 5 to 10 minutes after injection. If you are using Fiasp, you should start your meal at the time of injection or within 20 minutes after injection. Have food ready before injection. Do not delay eating. You will be taught how to use this medication and how to adjust doses for activities and illness. Do not use more insulin than prescribed. Do not use more or less often than prescribed. ?Always check the appearance of your insulin before using it. This medication should be clear and colorless like water. Do not use if it is cloudy, thickened, colored, or has solid particles in it. ?If you use a pen, be sure to take off the outer needle cover before using the dose. ?It is important that you put your used needles and syringes in a special sharps container. Do not put them in  a trash can. If you do not have a sharps container, call your pharmacist or care team to get one. ?This medication comes with INSTRUCTIONS FOR USE. Ask your pharmacist for directions on how to use this medication. Read the information carefully. Talk to your pharmacist or care team if you have questions. ?Talk to your care team regarding the use of this medication in children. While this medication may be prescribed for children as young as 2 years for selected conditions, precautions do apply. ?Overdosage: If you think you have taken too much of this medicine contact a poison control center or emergency room at once. ?NOTE: This medicine is only for you. Do not share this medicine with others. ?What if I miss a dose? ?It is important not to miss a dose. Your care team should discuss a plan for missed doses with you. If you do miss a dose, follow their plan. Do not take double doses. ?What may interact with this medication? ?Alcohol containing beverages ?Antiviral medications for HIV or AIDS ?Aspirin and aspirin-like medications ?Beta-blockers like atenolol, metoprolol, propranolol ?Certain medications for depression, anxiety, or psychotic disturbances ?Chromium ?Clonidine ?Diuretics ?Estrogens and progestins ?Guanethidine ?Heart medications ?Isoniazid ?MAOIs like Carbex, Eldepryl, Marplan, Nardil, and Parnate ?Male hormones or anabolic steroids ?Medications for weight loss ?Medications for allergies, asthma, cold, or cough ?Niacin ?NSAIDs, medications for pain and inflammation, like ibuprofen or naproxen ?Octreotide ?Other medications for diabetes, like glyburide, glipizide, or glimepiride ?Pentamidine ?Phenytoin ?Probenecid ?Quinolone antibiotics like ciprofloxacin, levofloxacin, ofloxacin ?Reserpine ?Some herbal dietary supplements ?Steroid medications like prednisone or cortisone ?Sulfamethoxazole; trimethoprim ?Thyroid  medication ?This list may not describe all possible interactions. Give your health care  provider a list of all the medicines, herbs, non-prescription drugs, or dietary supplements you use. Also tell them if you smoke, drink alcohol, or use illegal drugs. Some items may interact with your medicine. ?What should I watch for while using this medication? ?Visit your care team for regular checks on your progress. ?A test called the HbA1C (A1C) will be monitored. This is a simple blood test. It measures your blood sugar control over the last 2 to 3 months. You will receive this test every 3 to 6 months. ?Learn how to check your blood sugar. Learn the symptoms of low and high blood sugar and how to manage them. ?Always carry a quick-source of sugar with you in case you have symptoms of low blood sugar. Examples include hard sugar candy or glucose tablets. Make sure others know that you can choke if you eat or drink when you develop serious symptoms of low blood sugar, such as seizures or unconsciousness. They must get medical help at once. ?Tell your care team if you have high blood sugar. You might need to change the dose of your medication. If you are sick or exercising more than usual, you might need to change the dose of your medication. ?Do not skip meals. Ask your care team if you should avoid alcohol. Many nonprescription cough and cold products contain sugar or alcohol. These can affect blood sugar. ?Make sure that you have the right kind of syringe for the type of insulin you use. Try not to change the brand and type of insulin or syringe unless your care team tells you to. Switching insulin brand or type can cause dangerously high or low blood sugar. Always keep an extra supply of insulin, syringes, and needles on hand. Use a syringe one time only. Throw away syringe and needle in a closed container to prevent accidental needle sticks. ?Insulin pens and cartridges should never be shared. Even if the needle is changed, sharing may result in passing of viruses like hepatitis or HIV. ?Each time you get  a new box of pen needles, check to see if they are the same type as the ones you were trained to use. If not, ask your care team to show you how to use this new type properly. ?Wear a medical ID bracelet or chain, and carry a card that describes your disease and details of your medication and dosage times. ?What side effects may I notice from receiving this medication? ?Side effects that you should report to your care team as soon as possible: ?Allergic reactions--skin rash, itching, hives, swelling of the face, lips, tongue, or throat ?Low blood sugar (hypoglycemia)--tremors or shaking, anxiety, sweating, cold or clammy skin, confusion, dizziness, rapid heartbeat ?Low potassium level--muscle pain or cramps, unusual weakness or fatigue, fast or irregular heartbeat, constipation ?Side effects that usually do not require medical attention (report to your care team if they continue or are bothersome): ?Lipodystrophy--hardening or scarring of tissue at injection site ?Pain, redness, or irritation at injection site ?Weight gain ?This list may not describe all possible side effects. Call your doctor for medical advice about side effects. You may report side effects to FDA at 1-800-FDA-1088. ?Where should I keep my medication? ?Keep out of the reach of children and pets. ?Unopened Vials: ?Novolog Vials: Store in a refrigerator between 2 and 8 degrees C (36 and 46 degrees F) or at room temperature below 30 degrees C (86  degrees F). Do not freeze or use if the insulin has been frozen. Protect from light and excessive heat. If stored at room temperature, the vial must be discarded after 28 days. Throw away any unopened and unused medication that has been stored in the refrigerator after the expiration date. ?Fiasp Vials: Store in a refrigerator between 2 and 8 degrees C (36 and 46 degrees F) or at room temperature below 30 degrees C (86 degrees F). Do not freeze or use if the insulin has been frozen. Protect from light and  excessive heat. If stored at room temperature, the vial must be discarded after 28 days. Throw away any unopened and unused medication that has been stored in the refrigerator after the expiration date. ?Un

## 2021-05-09 NOTE — Progress Notes (Signed)
? ?Subjective:  ?Patient ID: Marvin Martin, male    DOB: May 14, 1965  Age: 56 y.o. MRN: [id number] ? ?CC: Hospitalization Follow-up ? ? ?HPI ?Marvin Martin is a 56 y.o. year old male with a history of type 2 diabetes mellitus (A1c 8.2), ischemic cardiomyopathy (status post heart transplant at [facility] in 12/2018 currently on immunosuppressive therapy), chronic kidney disease stage III (status post renal transplant in 06/2019), hospitalized for acute cardiac rejection and 12/2020. ?He presents today for hospital follow-up after hospitalization at [facility] for hyperglycemia 2 weeks ago after he was found to have a blood glucose of greater than 600 at the [facility] endocrinology clinic. ?His medication regimen was adjusted and Lantus decreased to 12 units nightly, metformin added, glipizide discontinued. ? ?Interval History: ? ?His blood sugar this morning was 239. Lowest blood sugar was 189, CBG in the clinic is 304 and he reports fasting as he has been unable to eat. ?Currently on 12 units of Lantus, Ozempic 2 mg weekly and metformin 500 mg twice daily.  Of note he is also on prednisone. ?His mouth has been dry and when he chews food he spits it out as he cannot swallow it.  States he has not eaten for 3 to 4 days.  Symptoms have been present since discharge.  He has used OTC Biotene and mouthwashes with no relief. ? ?Next appt at [facility] is on 05/19/21 but he is unsure of when he gets to see endocrinology. ?Past Medical History:  ?Diagnosis Date  ? Chronic systolic CHF (congestive heart failure) ([place])   ? a. 01/2015 Echo: EF 20-25%, antlat, inflat AK.  ? CKD (chronic kidney disease), stage III ([place])   ? Coronary artery disease   ? a. s/p MI x 4;  b. Reported h/o 17 stents with recurrent ISR;  c. 02/2014 Cath ([place]): PCI to unknown vessel;  d. 01/2015 MV: EF 18%, large scar, no ischemia; e. 03/2015 PCI: LM nl, LAD nl, D2 patent stent, RI patent stent, LCX patent stents p/m, RCA 95p ISR (PTCA), 49m, 100d into RPL  CTO, RPDA 90 (2.5x16 Synergy DES).  ? Diabetes mellitus without complication ([place])   ? Hyperlipidemia   ? Hypertensive heart disease   ? Ischemic cardiomyopathy   ? a. s/p SJM ICD 2007 w/ gen change in 2012; b. 12/2014 CPX Test: mild to mod HF limitation w/ pVO2 20.3 and nl slope;  c. 01/2015 Echo: EF 20-25%.  ? ? ?Past Surgical History:  ?Procedure Laterality Date  ? 62 stints placements Bilateral   ? CARDIAC CATHETERIZATION    ? Most recent 02/2014   ? CARDIAC CATHETERIZATION N/A 03/31/2015  ? Procedure: Left Heart Cath and Coronary Angiography;  Surgeon: Troy Sine, MD;  Location: [place] [facility];  Service: Cardiovascular;  Laterality: N/A;  ? CARDIAC CATHETERIZATION N/A 03/31/2015  ? Procedure: Coronary Stent Intervention;  Surgeon: Troy Sine, MD;  Location: [place] [facility];  Service: Cardiovascular;  Laterality: N/A;  ? CARDIAC CATHETERIZATION N/A 05/27/2015  ? Procedure: Right/Left Heart Cath and Coronary Angiography;  Surgeon: Jolaine Artist, MD;  Location: [place] [facility];  Service: Cardiovascular;  Laterality: N/A;  ? CARDIAC CATHETERIZATION N/A 06/23/2015  ? Procedure: Right/Left Heart Cath and Coronary Angiography;  Surgeon: Jolaine Artist, MD;  Location: [place] [facility];  Service: Cardiovascular;  Laterality: N/A;  ? HEART TRANSPLANT    ? INSERT / REPLACE / REMOVE PACEMAKER    ? St jude 2006  Generator Change  2012   ? NEPHRECTOMY TRANSPLANTED ORGAN    ? PACEMAKER GENERATOR CHANGE Bilateral   ? ? ?Family History  ?Problem Relation Age of Onset  ? Cancer Mother   ?     died when pt was 27.  She had a h/o heart dzs as well.  ? Hypertension Mother   ? Hyperlipidemia Father   ?     Father died when pt was age 67 - he believes that he had a cardiac hx.  ? CAD Sister   ? ? ?Social History  ? ?Socioeconomic History  ? Marital status: Legally Separated  ?  Spouse name: Not on file  ? Number of children: Not on file  ? Years of education: 79  ? Highest education level: High school graduate   ?Occupational History  ? Not on file  ?Tobacco Use  ? Smoking status: Never  ? Smokeless tobacco: Never  ?Vaping Use  ? Vaping Use: Never used  ?Substance and Sexual Activity  ? Alcohol use: No  ?  Alcohol/week: 0.0 standard drinks  ? Drug use: No  ? Sexual activity: Not Currently  ?Other Topics Concern  ? Not on file  ?Social History Narrative  ? Moved to [place] from [place] in 2016.  Lives alone in [place].  ? ?Social Determinants of Health  ? ?Financial Resource Strain: Low Risk   ? Difficulty of Paying Living Expenses: Not hard at all  ?Food Insecurity: No Food Insecurity  ? Worried About Charity fundraiser in the Last Year: Never true  ? Ran Out of Food in the Last Year: Never true  ?Transportation Needs: No Transportation Needs  ? Lack of Transportation (Medical): No  ? Lack of Transportation (Non-Medical): No  ?Physical Activity: Insufficiently Active  ? Days of Exercise per Week: 3 days  ? Minutes of Exercise per Session: 40 min  ?Stress: No Stress Concern Present  ? Feeling of Stress : Only a little  ?Social Connections: Moderately Integrated  ? Frequency of Communication with Friends and Family: More than three times a week  ? Frequency of Social Gatherings with Friends and Family: More than three times a week  ? Attends Religious Services: More than 4 times per year  ? Active Member of Clubs or Organizations: Yes  ? Attends Archivist Meetings: More than 4 times per year  ? Marital Status: Separated  ? ? ?No Known Allergies ? ?Outpatient Medications Prior to Visit  ?Medication Sig Dispense Refill  ? acetaminophen (TYLENOL) 325 MG tablet Take 650 mg by mouth every 6 (six) hours as needed for mild pain or headache.    ? amLODipine (NORVASC) 5 MG tablet Take 5 mg by mouth daily.    ? aspirin 81 MG tablet Take 81 mg by mouth daily.    ? Calcium Carbonate-Vitamin D 600-400 MG-UNIT tablet Take 1 tablet by mouth 2 (two) times daily.    ? cholecalciferol (VITAMIN D) 1000 UNITS tablet Take 1,000 Units by mouth  daily.    ? Continuous Blood Gluc Receiver (FREESTYLE LIBRE 2 READER) DEVI 3 Units by Does not apply route in the morning, at noon, and at bedtime. 1 each 4  ? Continuous Blood Gluc Sensor (FREESTYLE LIBRE 2 SENSOR) MISC 3 Units by Does not apply route in the morning, at noon, and at bedtime. 1 each 4  ? CORLANOR 5 MG TABS tablet Take 5 mg by mouth daily.    ? diclofenac Sodium (VOLTAREN) 1 % GEL Apply 4 g  topically 4 (four) times daily. (Patient taking differently: Apply 4 g topically 4 (four) times daily as needed (pain).) 100 g 1  ? ezetimibe (ZETIA) 10 MG tablet Take 1 tablet (10 mg total) by mouth daily. Needs appt for further refills (Patient taking differently: Take 10 mg by mouth daily.) 90 tablet 0  ? HYDROcodone-acetaminophen (NORCO/VICODIN) 5-325 MG tablet Take 1 tablet by mouth every 4 (four) hours as needed. (Patient taking differently: Take 1 tablet by mouth every 4 (four) hours as needed for moderate pain.) 10 tablet 0  ? insulin glargine (LANTUS SOLOSTAR) 100 UNIT/ML Solostar Pen Inject 20 Units into the skin daily. 30 mL 6  ? Insulin Pen Needle 32G X 4 MM MISC 1 application by Does not apply route 4 (four) times daily. 200 each 5  ? isosorbide mononitrate (IMDUR) 30 MG 24 hr tablet TAKE 1 TABLET BY MOUTH EVERY DAY (Patient taking differently: Take 30 mg by mouth daily.) 90 tablet 3  ? lidocaine (LIDODERM) 5 % Place 1 patch onto the skin daily. Remove & Discard patch within 12 hours or as directed by MD (Patient taking differently: Place 1 patch onto the skin daily as needed (pain). Remove & Discard patch within 12 hours or as directed by MD) 30 patch 0  ? mycophenolate (CELLCEPT) 500 MG tablet Take 1,000 mg by mouth in the morning and at bedtime.    ? nitroGLYCERIN (NITROSTAT) 0.4 MG SL tablet Place 1 tablet (0.4 mg total) under the tongue every 5 (five) minutes as needed for chest pain. Reported on 04/12/2015 60 tablet 1  ? nystatin (MYCOSTATIN) 100000 UNIT/ML suspension Take 5 mLs by mouth in the  morning, at noon, in the evening, and at bedtime.    ? oxyCODONE-acetaminophen (PERCOCET/ROXICET) 5-325 MG tablet Take 1 tablet by mouth every 6 (six) hours as needed for severe pain. 10 tablet 0  ? pantopra

## 2021-05-09 NOTE — Telephone Encounter (Signed)
Met with the patient when he was in the clinic this morning. He needed part B of [employer] [place] application completed for re-certification.   ?I completed part B and gave it to him to submit with Part A of the application that he had at home.  ?

## 2021-05-09 NOTE — Progress Notes (Signed)
Has been having dry mouth ?

## 2021-05-16 ENCOUNTER — Ambulatory Visit: Payer: Medicare Other | Admitting: Pharmacist

## 2021-06-07 ENCOUNTER — Other Ambulatory Visit: Payer: Self-pay | Admitting: Family Medicine

## 2021-06-08 NOTE — Telephone Encounter (Signed)
Requested medication (s) are due for refill today: No  Requested medication (s) are on the active medication list: Yes  Last refill:  05/09/21  Future visit scheduled: Yes  Notes to clinic:  Pharmacy states not covered by insurance. Asking for alternative.    Requested Prescriptions  Pending Prescriptions Disp Refills   HUMALOG KWIKPEN 100 UNIT/ML KwikPen [Pharmacy Med Name: HUMALOG 100 UNIT/ML KWIKPEN]  0     Endocrinology:  Diabetes - Insulins Failed - 06/07/2021 12:19 AM      Failed - HBA1C is between 0 and 7.9 and within 180 days    HbA1c, POC (controlled diabetic range)  Date Value Ref Range Status  04/07/2021 8.2 (A) 0.0 - 7.0 % Final   Hgb A1c MFr Bld  Date Value Ref Range Status  04/23/2021 11.6 (H) 4.8 - 5.6 % Final    Comment:    (NOTE) Pre diabetes:          5.7%-6.4%  Diabetes:              >6.4%  Glycemic control for   <7.0% adults with diabetes          Passed - Valid encounter within last 6 months    Recent Outpatient Visits           1 month ago Type 2 diabetes mellitus with other specified complication, with long-term current use of insulin ([place])   [place], [place], MD   2 months ago Type 2 diabetes mellitus with other specified complication, with long-term current use of insulin ([place])   [place], Moca, MD   8 months ago Type 2 diabetes mellitus with hyperglycemia, with long-term current use of insulin ([place])   [place], [place], MD   1 year ago Type 2 diabetes mellitus with hyperglycemia, with long-term current use of insulin [facility])   [place], Jarome Matin, RPH-CPP   1 year ago Type 2 diabetes mellitus with hyperglycemia, with long-term current use of insulin ([place])   [place], Enobong, MD       Future Appointments             In 4 months  [place] Rakes, MD [place]

## 2021-07-21 DIAGNOSIS — K219 Gastro-esophageal reflux disease without esophagitis: Secondary | ICD-10-CM | POA: Diagnosis not present

## 2021-07-21 DIAGNOSIS — Z7985 Long-term (current) use of injectable non-insulin antidiabetic drugs: Secondary | ICD-10-CM | POA: Diagnosis not present

## 2021-07-21 DIAGNOSIS — Z7982 Long term (current) use of aspirin: Secondary | ICD-10-CM | POA: Diagnosis not present

## 2021-07-21 DIAGNOSIS — Z7952 Long term (current) use of systemic steroids: Secondary | ICD-10-CM | POA: Diagnosis not present

## 2021-07-21 DIAGNOSIS — Z7984 Long term (current) use of oral hypoglycemic drugs: Secondary | ICD-10-CM | POA: Diagnosis not present

## 2021-07-21 DIAGNOSIS — K449 Diaphragmatic hernia without obstruction or gangrene: Secondary | ICD-10-CM | POA: Diagnosis not present

## 2021-07-21 DIAGNOSIS — R1314 Dysphagia, pharyngoesophageal phase: Secondary | ICD-10-CM | POA: Diagnosis not present

## 2021-07-21 DIAGNOSIS — E1122 Type 2 diabetes mellitus with diabetic chronic kidney disease: Secondary | ICD-10-CM | POA: Diagnosis not present

## 2021-07-21 DIAGNOSIS — Z941 Heart transplant status: Secondary | ICD-10-CM | POA: Diagnosis not present

## 2021-07-21 DIAGNOSIS — Z940 Kidney transplant status: Secondary | ICD-10-CM | POA: Diagnosis not present

## 2021-07-21 DIAGNOSIS — N1832 Chronic kidney disease, stage 3b: Secondary | ICD-10-CM | POA: Diagnosis not present

## 2021-07-21 DIAGNOSIS — E559 Vitamin D deficiency, unspecified: Secondary | ICD-10-CM | POA: Diagnosis not present

## 2021-07-21 DIAGNOSIS — K224 Dyskinesia of esophagus: Secondary | ICD-10-CM | POA: Diagnosis not present

## 2021-07-21 DIAGNOSIS — Z9181 History of falling: Secondary | ICD-10-CM | POA: Diagnosis not present

## 2021-07-21 DIAGNOSIS — I129 Hypertensive chronic kidney disease with stage 1 through stage 4 chronic kidney disease, or unspecified chronic kidney disease: Secondary | ICD-10-CM | POA: Diagnosis not present

## 2021-07-21 DIAGNOSIS — Z794 Long term (current) use of insulin: Secondary | ICD-10-CM | POA: Diagnosis not present

## 2021-07-21 DIAGNOSIS — I088 Other rheumatic multiple valve diseases: Secondary | ICD-10-CM | POA: Diagnosis not present

## 2021-07-21 DIAGNOSIS — E785 Hyperlipidemia, unspecified: Secondary | ICD-10-CM | POA: Diagnosis not present

## 2021-07-22 DIAGNOSIS — D849 Immunodeficiency, unspecified: Secondary | ICD-10-CM | POA: Diagnosis not present

## 2021-07-22 DIAGNOSIS — Z940 Kidney transplant status: Secondary | ICD-10-CM | POA: Diagnosis not present

## 2021-07-22 DIAGNOSIS — Z48298 Encounter for aftercare following other organ transplant: Secondary | ICD-10-CM | POA: Diagnosis not present

## 2021-07-27 DIAGNOSIS — I129 Hypertensive chronic kidney disease with stage 1 through stage 4 chronic kidney disease, or unspecified chronic kidney disease: Secondary | ICD-10-CM | POA: Diagnosis not present

## 2021-07-27 DIAGNOSIS — K224 Dyskinesia of esophagus: Secondary | ICD-10-CM | POA: Diagnosis not present

## 2021-07-27 DIAGNOSIS — Z940 Kidney transplant status: Secondary | ICD-10-CM | POA: Diagnosis not present

## 2021-07-27 DIAGNOSIS — Z941 Heart transplant status: Secondary | ICD-10-CM | POA: Diagnosis not present

## 2021-07-27 DIAGNOSIS — Z794 Long term (current) use of insulin: Secondary | ICD-10-CM | POA: Diagnosis not present

## 2021-07-27 DIAGNOSIS — E559 Vitamin D deficiency, unspecified: Secondary | ICD-10-CM | POA: Diagnosis not present

## 2021-07-27 DIAGNOSIS — E785 Hyperlipidemia, unspecified: Secondary | ICD-10-CM | POA: Diagnosis not present

## 2021-07-27 DIAGNOSIS — Z7985 Long-term (current) use of injectable non-insulin antidiabetic drugs: Secondary | ICD-10-CM | POA: Diagnosis not present

## 2021-07-27 DIAGNOSIS — N1832 Chronic kidney disease, stage 3b: Secondary | ICD-10-CM | POA: Diagnosis not present

## 2021-07-27 DIAGNOSIS — R1314 Dysphagia, pharyngoesophageal phase: Secondary | ICD-10-CM | POA: Diagnosis not present

## 2021-07-27 DIAGNOSIS — I088 Other rheumatic multiple valve diseases: Secondary | ICD-10-CM | POA: Diagnosis not present

## 2021-07-27 DIAGNOSIS — Z7982 Long term (current) use of aspirin: Secondary | ICD-10-CM | POA: Diagnosis not present

## 2021-07-27 DIAGNOSIS — Z7952 Long term (current) use of systemic steroids: Secondary | ICD-10-CM | POA: Diagnosis not present

## 2021-07-27 DIAGNOSIS — K449 Diaphragmatic hernia without obstruction or gangrene: Secondary | ICD-10-CM | POA: Diagnosis not present

## 2021-07-27 DIAGNOSIS — Z7984 Long term (current) use of oral hypoglycemic drugs: Secondary | ICD-10-CM | POA: Diagnosis not present

## 2021-07-27 DIAGNOSIS — Z9181 History of falling: Secondary | ICD-10-CM | POA: Diagnosis not present

## 2021-07-27 DIAGNOSIS — E1122 Type 2 diabetes mellitus with diabetic chronic kidney disease: Secondary | ICD-10-CM | POA: Diagnosis not present

## 2021-07-27 DIAGNOSIS — K219 Gastro-esophageal reflux disease without esophagitis: Secondary | ICD-10-CM | POA: Diagnosis not present

## 2021-07-28 ENCOUNTER — Telehealth: Payer: Self-pay

## 2021-07-28 NOTE — Telephone Encounter (Signed)
I spoke to Courtney Johnson/[employer] and she confirmed that the patient has been approved for transportation services.  

## 2021-08-03 DIAGNOSIS — N1832 Chronic kidney disease, stage 3b: Secondary | ICD-10-CM | POA: Diagnosis not present

## 2021-08-03 DIAGNOSIS — E559 Vitamin D deficiency, unspecified: Secondary | ICD-10-CM | POA: Diagnosis not present

## 2021-08-03 DIAGNOSIS — I088 Other rheumatic multiple valve diseases: Secondary | ICD-10-CM | POA: Diagnosis not present

## 2021-08-03 DIAGNOSIS — R1314 Dysphagia, pharyngoesophageal phase: Secondary | ICD-10-CM | POA: Diagnosis not present

## 2021-08-03 DIAGNOSIS — K219 Gastro-esophageal reflux disease without esophagitis: Secondary | ICD-10-CM | POA: Diagnosis not present

## 2021-08-03 DIAGNOSIS — K449 Diaphragmatic hernia without obstruction or gangrene: Secondary | ICD-10-CM | POA: Diagnosis not present

## 2021-08-03 DIAGNOSIS — Z7984 Long term (current) use of oral hypoglycemic drugs: Secondary | ICD-10-CM | POA: Diagnosis not present

## 2021-08-03 DIAGNOSIS — Z940 Kidney transplant status: Secondary | ICD-10-CM | POA: Diagnosis not present

## 2021-08-03 DIAGNOSIS — Z7982 Long term (current) use of aspirin: Secondary | ICD-10-CM | POA: Diagnosis not present

## 2021-08-03 DIAGNOSIS — Z7985 Long-term (current) use of injectable non-insulin antidiabetic drugs: Secondary | ICD-10-CM | POA: Diagnosis not present

## 2021-08-03 DIAGNOSIS — K224 Dyskinesia of esophagus: Secondary | ICD-10-CM | POA: Diagnosis not present

## 2021-08-03 DIAGNOSIS — Z794 Long term (current) use of insulin: Secondary | ICD-10-CM | POA: Diagnosis not present

## 2021-08-03 DIAGNOSIS — Z941 Heart transplant status: Secondary | ICD-10-CM | POA: Diagnosis not present

## 2021-08-03 DIAGNOSIS — Z7952 Long term (current) use of systemic steroids: Secondary | ICD-10-CM | POA: Diagnosis not present

## 2021-08-03 DIAGNOSIS — E1122 Type 2 diabetes mellitus with diabetic chronic kidney disease: Secondary | ICD-10-CM | POA: Diagnosis not present

## 2021-08-03 DIAGNOSIS — I129 Hypertensive chronic kidney disease with stage 1 through stage 4 chronic kidney disease, or unspecified chronic kidney disease: Secondary | ICD-10-CM | POA: Diagnosis not present

## 2021-08-03 DIAGNOSIS — Z9181 History of falling: Secondary | ICD-10-CM | POA: Diagnosis not present

## 2021-08-03 DIAGNOSIS — E785 Hyperlipidemia, unspecified: Secondary | ICD-10-CM | POA: Diagnosis not present

## 2021-08-10 DIAGNOSIS — K224 Dyskinesia of esophagus: Secondary | ICD-10-CM | POA: Diagnosis not present

## 2021-08-10 DIAGNOSIS — Z794 Long term (current) use of insulin: Secondary | ICD-10-CM | POA: Diagnosis not present

## 2021-08-10 DIAGNOSIS — E785 Hyperlipidemia, unspecified: Secondary | ICD-10-CM | POA: Diagnosis not present

## 2021-08-10 DIAGNOSIS — Z7952 Long term (current) use of systemic steroids: Secondary | ICD-10-CM | POA: Diagnosis not present

## 2021-08-10 DIAGNOSIS — Z7982 Long term (current) use of aspirin: Secondary | ICD-10-CM | POA: Diagnosis not present

## 2021-08-10 DIAGNOSIS — K449 Diaphragmatic hernia without obstruction or gangrene: Secondary | ICD-10-CM | POA: Diagnosis not present

## 2021-08-10 DIAGNOSIS — Z9181 History of falling: Secondary | ICD-10-CM | POA: Diagnosis not present

## 2021-08-10 DIAGNOSIS — R1314 Dysphagia, pharyngoesophageal phase: Secondary | ICD-10-CM | POA: Diagnosis not present

## 2021-08-10 DIAGNOSIS — Z7985 Long-term (current) use of injectable non-insulin antidiabetic drugs: Secondary | ICD-10-CM | POA: Diagnosis not present

## 2021-08-10 DIAGNOSIS — E559 Vitamin D deficiency, unspecified: Secondary | ICD-10-CM | POA: Diagnosis not present

## 2021-08-10 DIAGNOSIS — Z940 Kidney transplant status: Secondary | ICD-10-CM | POA: Diagnosis not present

## 2021-08-10 DIAGNOSIS — E1122 Type 2 diabetes mellitus with diabetic chronic kidney disease: Secondary | ICD-10-CM | POA: Diagnosis not present

## 2021-08-10 DIAGNOSIS — Z7984 Long term (current) use of oral hypoglycemic drugs: Secondary | ICD-10-CM | POA: Diagnosis not present

## 2021-08-10 DIAGNOSIS — K219 Gastro-esophageal reflux disease without esophagitis: Secondary | ICD-10-CM | POA: Diagnosis not present

## 2021-08-10 DIAGNOSIS — N1832 Chronic kidney disease, stage 3b: Secondary | ICD-10-CM | POA: Diagnosis not present

## 2021-08-10 DIAGNOSIS — Z941 Heart transplant status: Secondary | ICD-10-CM | POA: Diagnosis not present

## 2021-08-10 DIAGNOSIS — I129 Hypertensive chronic kidney disease with stage 1 through stage 4 chronic kidney disease, or unspecified chronic kidney disease: Secondary | ICD-10-CM | POA: Diagnosis not present

## 2021-08-10 DIAGNOSIS — I088 Other rheumatic multiple valve diseases: Secondary | ICD-10-CM | POA: Diagnosis not present

## 2021-08-16 LAB — HEMOGLOBIN A1C: Hemoglobin A1C: 6.2

## 2021-08-28 ENCOUNTER — Other Ambulatory Visit: Payer: Self-pay | Admitting: Family Medicine

## 2021-08-29 DIAGNOSIS — E162 Hypoglycemia, unspecified: Secondary | ICD-10-CM | POA: Diagnosis not present

## 2021-08-29 DIAGNOSIS — E1165 Type 2 diabetes mellitus with hyperglycemia: Secondary | ICD-10-CM | POA: Diagnosis not present

## 2021-08-29 DIAGNOSIS — Z794 Long term (current) use of insulin: Secondary | ICD-10-CM | POA: Diagnosis not present

## 2021-10-10 ENCOUNTER — Ambulatory Visit: Payer: Medicare Other | Admitting: Family Medicine

## 2021-10-10 DIAGNOSIS — I088 Other rheumatic multiple valve diseases: Secondary | ICD-10-CM | POA: Diagnosis not present

## 2021-10-10 DIAGNOSIS — Z940 Kidney transplant status: Secondary | ICD-10-CM | POA: Diagnosis not present

## 2021-10-10 DIAGNOSIS — Z941 Heart transplant status: Secondary | ICD-10-CM | POA: Diagnosis not present

## 2021-10-10 DIAGNOSIS — T8621 Heart transplant rejection: Secondary | ICD-10-CM | POA: Diagnosis not present

## 2021-10-10 DIAGNOSIS — E1122 Type 2 diabetes mellitus with diabetic chronic kidney disease: Secondary | ICD-10-CM | POA: Diagnosis not present

## 2021-10-10 DIAGNOSIS — I10 Essential (primary) hypertension: Secondary | ICD-10-CM | POA: Diagnosis not present

## 2021-10-10 DIAGNOSIS — Z79899 Other long term (current) drug therapy: Secondary | ICD-10-CM | POA: Diagnosis not present

## 2021-10-10 DIAGNOSIS — Z794 Long term (current) use of insulin: Secondary | ICD-10-CM | POA: Diagnosis not present

## 2021-10-10 DIAGNOSIS — Z48298 Encounter for aftercare following other organ transplant: Secondary | ICD-10-CM | POA: Diagnosis not present

## 2021-10-10 DIAGNOSIS — N183 Chronic kidney disease, stage 3 unspecified: Secondary | ICD-10-CM | POA: Diagnosis not present

## 2021-10-10 DIAGNOSIS — I129 Hypertensive chronic kidney disease with stage 1 through stage 4 chronic kidney disease, or unspecified chronic kidney disease: Secondary | ICD-10-CM | POA: Diagnosis not present

## 2021-10-10 DIAGNOSIS — Z298 Encounter for other specified prophylactic measures: Secondary | ICD-10-CM | POA: Diagnosis not present

## 2021-10-10 DIAGNOSIS — R1310 Dysphagia, unspecified: Secondary | ICD-10-CM | POA: Diagnosis not present

## 2021-10-10 DIAGNOSIS — Z23 Encounter for immunization: Secondary | ICD-10-CM | POA: Diagnosis not present

## 2021-10-10 DIAGNOSIS — D84821 Immunodeficiency due to drugs: Secondary | ICD-10-CM | POA: Diagnosis not present

## 2021-10-11 ENCOUNTER — Ambulatory Visit: Payer: Medicare Other

## 2021-10-12 ENCOUNTER — Ambulatory Visit: Payer: Medicare Other | Attending: Family Medicine | Admitting: Family Medicine

## 2021-10-12 ENCOUNTER — Encounter: Payer: Self-pay | Admitting: Family Medicine

## 2021-10-12 VITALS — BP 132/94 | HR 112 | Temp 98.8°F | Ht 67.0 in | Wt 134.4 lb

## 2021-10-12 DIAGNOSIS — E785 Hyperlipidemia, unspecified: Secondary | ICD-10-CM

## 2021-10-12 DIAGNOSIS — E1169 Type 2 diabetes mellitus with other specified complication: Secondary | ICD-10-CM | POA: Diagnosis not present

## 2021-10-12 DIAGNOSIS — I152 Hypertension secondary to endocrine disorders: Secondary | ICD-10-CM

## 2021-10-12 DIAGNOSIS — Z794 Long term (current) use of insulin: Secondary | ICD-10-CM | POA: Diagnosis not present

## 2021-10-12 DIAGNOSIS — Z940 Kidney transplant status: Secondary | ICD-10-CM

## 2021-10-12 DIAGNOSIS — Z941 Heart transplant status: Secondary | ICD-10-CM | POA: Diagnosis not present

## 2021-10-12 DIAGNOSIS — E1159 Type 2 diabetes mellitus with other circulatory complications: Secondary | ICD-10-CM

## 2021-10-12 MED ORDER — CORLANOR 5 MG PO TABS: 5.0000 mg | ORAL_TABLET | Freq: Every day | ORAL | 0 refills | Status: DC

## 2021-10-12 NOTE — Progress Notes (Signed)
Subjective:  Patient ID: Marvin Martin, male    DOB: 1965-09-23  Age: 56 y.o. MRN: [id number]  CC: Hospitalization Follow-up   HPI Marvin Martin is a 56 y.o. year old male with a history of type 2 diabetes mellitus (A1c 6.2), ischemic cardiomyopathy (status post heart transplant at [facility] in 12/2018 currently on immunosuppressive therapy), chronic kidney disease stage III (status post renal transplant in 06/2019), hospitalized for acute cardiac rejection in 12/2020.  Interval History: His diabetes is currently managed by [facility] endocrine team with his last visit 6 weeks ago. Last A1c was 6.2.  Denies presence of hypoglycemia, neuropathy, visual concerns. He is not up to date on annual eye exams.  Also closely followed by the renal and cardiac transplant teams at [facility].  With renal transplant team appointment on 07/2021 and cardiac transplant team follow-up in 06/2021.  Remains on his immunosuppressants and prednisone. He will need a refill of his Corlanor which he forgot to receive at his cardiac transplant appointment. His BP is slightly above goal and he is tachycardic which he attributes to being nervous whenever he goes to the doctor's office.  He feels great, denies presence of dyspnea, chest pain, pedal edema and has no concerns today.  Received his flu shot at [facility]. Past Medical History:  Diagnosis Date   Chronic systolic CHF (congestive heart failure) ([place])    a. 01/2015 Echo: EF 20-25%, antlat, inflat AK.   CKD (chronic kidney disease), stage III (HCC)    Coronary artery disease    a. s/p MI x 4;  b. Reported h/o 17 stents with recurrent ISR;  c. 02/2014 Cath ([place]): PCI to unknown vessel;  d. 01/2015 MV: EF 18%, large scar, no ischemia; e. 03/2015 PCI: LM nl, LAD nl, D2 patent stent, RI patent stent, LCX patent stents p/m, RCA 95p ISR (PTCA), 75m, 100d into RPL CTO, RPDA 90 (2.5x16 Synergy DES).   Diabetes mellitus without complication  ([place])    Hyperlipidemia    Hypertensive heart disease    Ischemic cardiomyopathy    a. s/p SJM ICD 2007 w/ gen change in 2012; b. 12/2014 CPX Test: mild to mod HF limitation w/ pVO2 20.3 and nl slope;  c. 01/2015 Echo: EF 20-25%.    Past Surgical History:  Procedure Laterality Date   40 stints placements Bilateral    CARDIAC CATHETERIZATION     Most recent 02/2014    CARDIAC CATHETERIZATION N/A 03/31/2015   Procedure: Left Heart Cath and Coronary Angiography;  Surgeon: Troy Sine, MD;  Location: [place] [facility];  Service: Cardiovascular;  Laterality: N/A;   CARDIAC CATHETERIZATION N/A 03/31/2015   Procedure: Coronary Stent Intervention;  Surgeon: Troy Sine, MD;  Location: [place] [facility];  Service: Cardiovascular;  Laterality: N/A;   CARDIAC CATHETERIZATION N/A 05/27/2015   Procedure: Right/Left Heart Cath and Coronary Angiography;  Surgeon: Jolaine Artist, MD;  Location: [place] [facility];  Service: Cardiovascular;  Laterality: N/A;   CARDIAC CATHETERIZATION N/A 06/23/2015   Procedure: Right/Left Heart Cath and Coronary Angiography;  Surgeon: Jolaine Artist, MD;  Location: [place] [facility];  Service: Cardiovascular;  Laterality: N/A;   HEART TRANSPLANT     INSERT / REPLACE / REMOVE PACEMAKER     St jude 2006  Generator Change 2012    NEPHRECTOMY TRANSPLANTED ORGAN     PACEMAKER GENERATOR CHANGE Bilateral     Family History  Problem Relation Age of Onset  Cancer Mother        died when pt was 26.  She had a h/o heart dzs as well.   Hypertension Mother    Hyperlipidemia Father        Father died when pt was age 58 - he believes that he had a cardiac hx.   CAD Sister     Social History   Socioeconomic History   Marital status: Legally Separated    Spouse name: Not on file   Number of children: Not on file   Years of education: 12   Highest education level: High school graduate  Occupational History   Not on file  Tobacco Use   Smoking status: Never    Smokeless tobacco: Never  Vaping Use   Vaping Use: Never used  Substance and Sexual Activity   Alcohol use: No    Alcohol/week: 0.0 standard drinks of alcohol   Drug use: No   Sexual activity: Not Currently  Other Topics Concern   Not on file  Social History Narrative   Moved to [place] from [place] in 2016.  Lives alone in [place].   Social Determinants of Health   Financial Resource Strain: Low Risk  (03/25/2021)   Overall Financial Resource Strain (CARDIA)    Difficulty of Paying Living Expenses: Not hard at all  Food Insecurity: No Food Insecurity (03/25/2021)   Hunger Vital Sign    Worried About Running Out of Food in the Last Year: Never true    Ran Out of Food in the Last Year: Never true  Transportation Needs: No Transportation Needs (03/25/2021)   PRAPARE - Hydrologist (Medical): No    Lack of Transportation (Non-Medical): No  Physical Activity: Insufficiently Active (03/25/2021)   Exercise Vital Sign    Days of Exercise per Week: 3 days    Minutes of Exercise per Session: 40 min  Stress: No Stress Concern Present (03/25/2021)   [place]    Feeling of Stress : Only a little  Social Connections: Moderately Integrated (03/25/2021)   Social Connection and Isolation Panel [NHANES]    Frequency of Communication with Friends and Family: More than three times a week    Frequency of Social Gatherings with Friends and Family: More than three times a week    Attends Religious Services: More than 4 times per year    Active Member of [employer] or Organizations: Yes    Attends Music therapist: More than 4 times per year    Marital Status: Separated    No Known Allergies  Outpatient Medications Prior to Visit  Medication Sig Dispense Refill   acetaminophen (TYLENOL) 325 MG tablet Take 650 mg by mouth every 6 (six) hours as needed for mild pain or headache.     amLODipine (NORVASC) 5 MG  tablet Take 5 mg by mouth daily.     aspirin 81 MG tablet Take 81 mg by mouth daily.     cholecalciferol (VITAMIN D) 1000 UNITS tablet Take 1,000 Units by mouth daily.     Continuous Blood Gluc Receiver (FREESTYLE LIBRE 2 READER) DEVI 3 Units by Does not apply route in the morning, at noon, and at bedtime. 1 each 4   Continuous Blood Gluc Sensor (FREESTYLE LIBRE 2 SENSOR) MISC 3 Units by Does not apply route in the morning, at noon, and at bedtime. 1 each 4   diclofenac Sodium (VOLTAREN) 1 % GEL Apply 4 g  topically 4 (four) times daily. (Patient taking differently: Apply 4 g topically 4 (four) times daily as needed (pain).) 100 g 1   ezetimibe (ZETIA) 10 MG tablet Take 1 tablet (10 mg total) by mouth daily. Needs appt for further refills (Patient taking differently: Take 10 mg by mouth daily.) 90 tablet 0   HYDROcodone-acetaminophen (NORCO/VICODIN) 5-325 MG tablet Take 1 tablet by mouth every 4 (four) hours as needed. (Patient taking differently: Take 1 tablet by mouth every 4 (four) hours as needed for moderate pain.) 10 tablet 0   insulin glargine (LANTUS SOLOSTAR) 100 UNIT/ML Solostar Pen Inject 20 Units into the skin daily. 30 mL 6   insulin lispro (HUMALOG KWIKPEN) 100 UNIT/ML KwikPen For blood sugars 0-150 give 0 units of insulin, 151-200 give 2 units of insulin, 201-250 give 4 units, 251-300 give 6 units, 301-350 give 8 units, 351-400 give 10 units,> 400 give 12 units. 15 mL 1   Insulin Pen Needle 32G X 4 MM MISC 1 application by Does not apply route 4 (four) times daily. 200 each 5   isosorbide mononitrate (IMDUR) 30 MG 24 hr tablet TAKE 1 TABLET BY MOUTH EVERY DAY (Patient taking differently: Take 30 mg by mouth daily.) 90 tablet 3   lidocaine (LIDODERM) 5 % Place 1 patch onto the skin daily. Remove & Discard patch within 12 hours or as directed by MD (Patient taking differently: Place 1 patch onto the skin daily as needed (pain). Remove & Discard patch within 12 hours or as directed by MD) 30  patch 0   mycophenolate (CELLCEPT) 500 MG tablet Take 1,000 mg by mouth in the morning and at bedtime.     nitroGLYCERIN (NITROSTAT) 0.4 MG SL tablet Place 1 tablet (0.4 mg total) under the tongue every 5 (five) minutes as needed for chest pain. Reported on 04/12/2015 60 tablet 1   nystatin (MYCOSTATIN) 100000 UNIT/ML suspension Take 5 mLs by mouth in the morning, at noon, in the evening, and at bedtime.     oxyCODONE-acetaminophen (PERCOCET/ROXICET) 5-325 MG tablet Take 1 tablet by mouth every 6 (six) hours as needed for severe pain. 10 tablet 0   pantoprazole (PROTONIX) 40 MG tablet Take 40 mg by mouth in the morning and at bedtime.     PRALUENT 75 MG/ML SOAJ INJECT 75 MG INTO THE SKIN EVERY 14 (FOURTEEN) DAYS. 6 pen 1   predniSONE (DELTASONE) 5 MG tablet Take 5 mg by mouth in the morning. Continuous     rosuvastatin (CRESTOR) 10 MG tablet Take 10 mg by mouth daily.     Semaglutide, 2 MG/DOSE, 8 MG/3ML SOPN Inject 2 mg as directed once a week. (Patient taking differently: Inject 2 mg as directed every Wednesday.) 3 mL 6   senna (SENOKOT) 8.6 MG tablet Take 2 tablets by mouth daily as needed for constipation.     tacrolimus (PROGRAF) 1 MG capsule Take 4 mg by mouth every 12 (twelve) hours.     CORLANOR 5 MG TABS tablet Take 5 mg by mouth daily.     Calcium Carbonate-Vitamin D 600-400 MG-UNIT tablet Take 1 tablet by mouth 2 (two) times daily.     No facility-administered medications prior to visit.     ROS Review of Systems  Constitutional:  Negative for activity change and appetite change.  HENT:  Negative for sinus pressure and sore throat.   Respiratory:  Negative for chest tightness, shortness of breath and wheezing.   Cardiovascular:  Negative for chest pain and palpitations.  Gastrointestinal:  Negative for abdominal distention, abdominal pain and constipation.  Genitourinary: Negative.   Musculoskeletal: Negative.   Psychiatric/Behavioral:  Negative for behavioral problems and  dysphoric mood.     Objective:  BP (!) 132/94   Pulse (!) 112   Temp 98.8 F (37.1 C) (Oral)   Ht 5\' 7"  (1.702 m)   Wt 134 lb 6.4 oz (61 kg)   SpO2 99%   BMI 21.05 kg/m      10/12/2021   10:03 AM 05/09/2021   11:17 AM 04/24/2021   11:37 AM  BP/Weight  Systolic BP 409 811 914  Diastolic BP 94 85 88  Wt. (Lbs) 134.4 131.6   BMI 21.05 kg/m2 20.61 kg/m2       Physical Exam Constitutional:      Appearance: He is well-developed.  Cardiovascular:     Rate and Rhythm: Tachycardia present.     Heart sounds: Normal heart sounds. No murmur heard. Pulmonary:     Effort: Pulmonary effort is normal.     Breath sounds: Normal breath sounds. No wheezing or rales.  Chest:     Chest wall: No tenderness.  Abdominal:     General: Bowel sounds are normal. There is no distension.     Palpations: Abdomen is soft. There is no mass.     Tenderness: There is no abdominal tenderness.  Musculoskeletal:        General: Normal range of motion.     Right lower leg: No edema.     Left lower leg: No edema.  Neurological:     Mental Status: He is alert and oriented to person, place, and time.  Psychiatric:        Mood and Affect: Mood normal.        Latest Ref Rng & Units 04/24/2021    2:44 AM 04/23/2021    5:50 PM 04/23/2021    6:54 AM  CMP  Glucose 70 - 99 mg/dL 228  301  227   BUN 6 - 20 mg/dL 37  39  44   Creatinine 0.61 - 1.24 mg/dL 2.19  2.20  2.16   Sodium 135 - 145 mmol/L 135  134  135   Potassium 3.5 - 5.1 mmol/L 4.6  4.9  5.6   Chloride 98 - 111 mmol/L 105  105  106   CO2 22 - 32 mmol/L 23  21  20    Calcium 8.9 - 10.3 mg/dL 8.9  9.0  9.3     Lipid Panel     Component Value Date/Time   CHOL 254 (H) 10/08/2020 0908   TRIG 345 (H) 10/08/2020 0908   HDL 46 10/08/2020 0908   CHOLHDL 5.5 (H) 10/08/2020 0908   CHOLHDL 2.3 06/22/2015 1609   VLDL 42 (H) 06/22/2015 1609   LDLCALC 145 (H) 10/08/2020 0908    CBC    Component Value Date/Time   WBC 7.0 04/23/2021 0138   RBC 4.22  04/23/2021 0138   HGB 13.6 04/23/2021 0138   HGB 15.7 10/08/2020 0908   HCT 36.7 (L) 04/23/2021 0138   HCT 46.9 10/08/2020 0908   PLT 182 04/23/2021 0138   PLT 240 10/08/2020 0908   MCV 87.0 04/23/2021 0138   MCV 96 10/08/2020 0908   MCH 32.2 04/23/2021 0138   MCHC 37.1 (H) 04/23/2021 0138   RDW 12.2 04/23/2021 0138   RDW 12.3 10/08/2020 0908   LYMPHSABS 0.9 12/12/2020 2248   LYMPHSABS 1.6 10/08/2020 0908   MONOABS 0.9 12/12/2020 2248  EOSABS 0.1 12/12/2020 2248   EOSABS 0.1 10/08/2020 0908   BASOSABS 0.0 12/12/2020 2248   BASOSABS 0.0 10/08/2020 0908    Lab Results  Component Value Date   HGBA1C 6.2 08/16/2021   Labs reviewed from care everywhere: A1c - 6.2 (from 08/2021) Lipid panel - total cholesterol 168, LDL 78, HDL 42, triglycerides 192 (from 07/2021) Creatinine-1.9 (from 09/2021)   Assessment & Plan:  1. Type 2 diabetes mellitus with other specified complication, with long-term current use of insulin (HCC) Controlled with A1c of 6.2 Continue Ozempic and Lantus Currently on high risk medication-prednisone which predisposes him to hyperglycemia Management per endocrine, [facility] Counseled on Diabetic diet, my plate method, 416 minutes of moderate intensity exercise/week Blood sugar logs with fasting goals of 80-120 mg/dl, random of less than 180 and in the event of sugars less than 60 mg/dl or greater than 400 mg/dl encouraged to notify the clinic. Advised on the need for annual eye exams, annual foot exams, Pneumonia vaccine. - Hemoglobin A1c - Ambulatory referral to Ophthalmology - Microalbumin/Creatinine Ratio, Urine  2. Hypertension associated with diabetes ([place]) Slightly above goal Antihypertensive regimen is managed by his cardiac and renal transplant team Continue medications as prescribed Counseled on blood pressure goal of less than 130/80, low-sodium, DASH diet, medication compliance, 150 minutes of moderate intensity exercise per week. Discussed medication  compliance, adverse effects.  3. Hyperlipidemia due to type 2 diabetes mellitus (HCC) Controlled Continues Crestor Low-cholesterol diet  4. H/O heart transplant ([place]) Stable Continue with immunosuppressants, prednisone Follow-up with cardiac transplant team at [facility] 5 MG TABS tablet; Take 1 tablet (5 mg total) by mouth daily.  Dispense: 60 tablet; Refill: 0  5. Renal transplant, status post Stable Continue with immunosuppressants, prednisone Follow-up with renal transplant team at [facility] Maintenance: Up-to-date on flu vaccine.  Needs shingles vaccine (he will need to check with his transplant team regarding this) Meds ordered this encounter  Medications   CORLANOR 5 MG TABS tablet    Sig: Take 1 tablet (5 mg total) by mouth daily.    Dispense:  60 tablet    Refill:  0    Subsequent refills to come from [facility] cardiac transplant team.    Follow-up: Return in about 6 months (around 04/12/2022) for Chronic medical conditions.       Charlott Rakes, MD, FAAFP. [facility] [place], [place]   10/12/2021, 11:13 AM

## 2021-10-12 NOTE — Patient Instructions (Signed)
Diabetes Mellitus and Foot Care Foot care is an important part of your health, especially when you have diabetes. Diabetes may cause you to have problems because of poor blood flow (circulation) to your feet and legs, which can cause your skin to: Become thinner and drier. Break more easily. Heal more slowly. Peel and crack. You may also have nerve damage (neuropathy) in your legs and feet, causing decreased feeling in them. This means that you may not notice minor injuries to your feet that could lead to more serious problems. Noticing and addressing any potential problems early is the best way to prevent future foot problems. How to care for your feet Foot hygiene  Wash your feet daily with warm water and mild soap. Do not use hot water. Then, pat your feet and the areas between your toes until they are completely dry. Do not soak your feet as this can dry your skin. Trim your toenails straight across. Do not dig under them or around the cuticle. File the edges of your nails with an emery board or nail file. Apply a moisturizing lotion or petroleum jelly to the skin on your feet and to dry, brittle toenails. Use lotion that does not contain alcohol and is unscented. Do not apply lotion between your toes. Shoes and socks Wear clean socks or stockings every day. Make sure they are not too tight. Do not wear knee-high stockings since they may decrease blood flow to your legs. Wear shoes that fit properly and have enough cushioning. Always look in your shoes before you put them on to be sure there are no objects inside. To break in new shoes, wear them for just a few hours a day. This prevents injuries on your feet. Wounds, scrapes, corns, and calluses  Check your feet daily for blisters, cuts, bruises, sores, and redness. If you cannot see the bottom of your feet, use a mirror or ask someone for help. Do not cut corns or calluses or try to remove them with medicine. If you find a minor scrape,  cut, or break in the skin on your feet, keep it and the skin around it clean and dry. You may clean these areas with mild soap and water. Do not clean the area with peroxide, alcohol, or iodine. If you have a wound, scrape, corn, or callus on your foot, look at it several times a day to make sure it is healing and not infected. Check for: Redness, swelling, or pain. Fluid or blood. Warmth. Pus or a bad smell. General tips Do not cross your legs. This may decrease blood flow to your feet. Do not use heating pads or hot water bottles on your feet. They may burn your skin. If you have lost feeling in your feet or legs, you may not know this is happening until it is too late. Protect your feet from hot and cold by wearing shoes, such as at the beach or on hot pavement. Schedule a complete foot exam at least once a year (annually) or more often if you have foot problems. Report any cuts, sores, or bruises to your health care provider immediately. Where to find more information American Diabetes Association: www.diabetes.org Association of Diabetes Care & Education Specialists: www.diabeteseducator.org Contact a health care provider if: You have a medical condition that increases your risk of infection and you have any cuts, sores, or bruises on your feet. You have an injury that is not healing. You have redness on your legs or feet. You   feel burning or tingling in your legs or feet. You have pain or cramps in your legs and feet. Your legs or feet are numb. Your feet always feel cold. You have pain around any toenails. Get help right away if: You have a wound, scrape, corn, or callus on your foot and: You have pain, swelling, or redness that gets worse. You have fluid or blood coming from the wound, scrape, corn, or callus. Your wound, scrape, corn, or callus feels warm to the touch. You have pus or a bad smell coming from the wound, scrape, corn, or callus. You have a fever. You have a red  line going up your leg. Summary Check your feet every day for blisters, cuts, bruises, sores, and redness. Apply a moisturizing lotion or petroleum jelly to the skin on your feet and to dry, brittle toenails. Wear shoes that fit properly and have enough cushioning. If you have foot problems, report any cuts, sores, or bruises to your health care provider immediately. Schedule a complete foot exam at least once a year (annually) or more often if you have foot problems. This information is not intended to replace advice given to you by your health care provider. Make sure you discuss any questions you have with your health care provider. Document Revised: 07/24/2019 Document Reviewed: 07/24/2019 Elsevier Patient Education  2023 Elsevier Inc.  

## 2021-10-13 ENCOUNTER — Other Ambulatory Visit: Payer: Self-pay

## 2021-10-13 LAB — MICROALBUMIN / CREATININE URINE RATIO
Creatinine, Urine: 276.9 mg/dL
Microalb/Creat Ratio: 228 mg/g creat — ABNORMAL HIGH (ref 0–29)
Microalbumin, Urine: 632 ug/mL

## 2021-11-30 DIAGNOSIS — I10 Essential (primary) hypertension: Secondary | ICD-10-CM | POA: Diagnosis not present

## 2021-11-30 DIAGNOSIS — R000 Tachycardia, unspecified: Secondary | ICD-10-CM | POA: Diagnosis not present

## 2021-11-30 DIAGNOSIS — E1165 Type 2 diabetes mellitus with hyperglycemia: Secondary | ICD-10-CM | POA: Diagnosis not present

## 2021-11-30 DIAGNOSIS — Z794 Long term (current) use of insulin: Secondary | ICD-10-CM | POA: Diagnosis not present

## 2021-12-15 DIAGNOSIS — I088 Other rheumatic multiple valve diseases: Secondary | ICD-10-CM | POA: Diagnosis not present

## 2021-12-15 DIAGNOSIS — D84821 Immunodeficiency due to drugs: Secondary | ICD-10-CM | POA: Diagnosis not present

## 2021-12-15 DIAGNOSIS — Z940 Kidney transplant status: Secondary | ICD-10-CM | POA: Diagnosis not present

## 2021-12-15 DIAGNOSIS — Z4821 Encounter for aftercare following heart transplant: Secondary | ICD-10-CM | POA: Diagnosis not present

## 2021-12-15 DIAGNOSIS — I10 Essential (primary) hypertension: Secondary | ICD-10-CM | POA: Diagnosis not present

## 2021-12-15 DIAGNOSIS — Z79899 Other long term (current) drug therapy: Secondary | ICD-10-CM | POA: Diagnosis not present

## 2021-12-15 DIAGNOSIS — Z2989 Encounter for other specified prophylactic measures: Secondary | ICD-10-CM | POA: Diagnosis not present

## 2021-12-15 DIAGNOSIS — Z941 Heart transplant status: Secondary | ICD-10-CM | POA: Diagnosis not present

## 2021-12-23 DIAGNOSIS — D849 Immunodeficiency, unspecified: Secondary | ICD-10-CM | POA: Diagnosis not present

## 2021-12-23 DIAGNOSIS — Z941 Heart transplant status: Secondary | ICD-10-CM | POA: Diagnosis not present

## 2021-12-23 DIAGNOSIS — Z48298 Encounter for aftercare following other organ transplant: Secondary | ICD-10-CM | POA: Diagnosis not present

## 2022-01-02 DIAGNOSIS — Z941 Heart transplant status: Secondary | ICD-10-CM | POA: Diagnosis not present

## 2022-01-02 DIAGNOSIS — Z48298 Encounter for aftercare following other organ transplant: Secondary | ICD-10-CM | POA: Diagnosis not present

## 2022-01-02 DIAGNOSIS — D849 Immunodeficiency, unspecified: Secondary | ICD-10-CM | POA: Diagnosis not present

## 2022-01-02 DIAGNOSIS — Z79899 Other long term (current) drug therapy: Secondary | ICD-10-CM | POA: Diagnosis not present

## 2022-01-19 DIAGNOSIS — Z940 Kidney transplant status: Secondary | ICD-10-CM | POA: Diagnosis not present

## 2022-01-19 DIAGNOSIS — Z79899 Other long term (current) drug therapy: Secondary | ICD-10-CM | POA: Diagnosis not present

## 2022-01-19 DIAGNOSIS — D849 Immunodeficiency, unspecified: Secondary | ICD-10-CM | POA: Diagnosis not present

## 2022-01-19 DIAGNOSIS — Z48298 Encounter for aftercare following other organ transplant: Secondary | ICD-10-CM | POA: Diagnosis not present

## 2022-01-19 DIAGNOSIS — Z941 Heart transplant status: Secondary | ICD-10-CM | POA: Diagnosis not present

## 2022-01-23 DIAGNOSIS — Z87891 Personal history of nicotine dependence: Secondary | ICD-10-CM | POA: Diagnosis not present

## 2022-01-23 DIAGNOSIS — I10 Essential (primary) hypertension: Secondary | ICD-10-CM | POA: Diagnosis not present

## 2022-01-23 DIAGNOSIS — Z941 Heart transplant status: Secondary | ICD-10-CM | POA: Diagnosis not present

## 2022-01-23 DIAGNOSIS — Z940 Kidney transplant status: Secondary | ICD-10-CM | POA: Diagnosis not present

## 2022-01-23 DIAGNOSIS — Z2989 Encounter for other specified prophylactic measures: Secondary | ICD-10-CM | POA: Diagnosis not present

## 2022-01-23 DIAGNOSIS — Z48298 Encounter for aftercare following other organ transplant: Secondary | ICD-10-CM | POA: Diagnosis not present

## 2022-01-23 DIAGNOSIS — I151 Hypertension secondary to other renal disorders: Secondary | ICD-10-CM | POA: Diagnosis not present

## 2022-01-23 DIAGNOSIS — D84821 Immunodeficiency due to drugs: Secondary | ICD-10-CM | POA: Diagnosis not present

## 2022-01-23 DIAGNOSIS — E119 Type 2 diabetes mellitus without complications: Secondary | ICD-10-CM | POA: Diagnosis not present

## 2022-01-23 DIAGNOSIS — Z23 Encounter for immunization: Secondary | ICD-10-CM | POA: Diagnosis not present

## 2022-01-23 DIAGNOSIS — Z4822 Encounter for aftercare following kidney transplant: Secondary | ICD-10-CM | POA: Diagnosis not present

## 2022-01-23 DIAGNOSIS — D849 Immunodeficiency, unspecified: Secondary | ICD-10-CM | POA: Diagnosis not present

## 2022-02-01 DIAGNOSIS — Z79899 Other long term (current) drug therapy: Secondary | ICD-10-CM | POA: Diagnosis not present

## 2022-02-01 DIAGNOSIS — Z48298 Encounter for aftercare following other organ transplant: Secondary | ICD-10-CM | POA: Diagnosis not present

## 2022-02-01 DIAGNOSIS — Z940 Kidney transplant status: Secondary | ICD-10-CM | POA: Diagnosis not present

## 2022-02-01 DIAGNOSIS — D849 Immunodeficiency, unspecified: Secondary | ICD-10-CM | POA: Diagnosis not present

## 2022-02-01 DIAGNOSIS — Z941 Heart transplant status: Secondary | ICD-10-CM | POA: Diagnosis not present

## 2022-02-03 DIAGNOSIS — Z794 Long term (current) use of insulin: Secondary | ICD-10-CM | POA: Diagnosis not present

## 2022-02-03 DIAGNOSIS — D84821 Immunodeficiency due to drugs: Secondary | ICD-10-CM | POA: Diagnosis not present

## 2022-02-03 DIAGNOSIS — Z86718 Personal history of other venous thrombosis and embolism: Secondary | ICD-10-CM | POA: Diagnosis not present

## 2022-02-03 DIAGNOSIS — Z7985 Long-term (current) use of injectable non-insulin antidiabetic drugs: Secondary | ICD-10-CM | POA: Diagnosis not present

## 2022-02-03 DIAGNOSIS — E785 Hyperlipidemia, unspecified: Secondary | ICD-10-CM | POA: Diagnosis not present

## 2022-02-03 DIAGNOSIS — E1122 Type 2 diabetes mellitus with diabetic chronic kidney disease: Secondary | ICD-10-CM | POA: Diagnosis not present

## 2022-02-03 DIAGNOSIS — I129 Hypertensive chronic kidney disease with stage 1 through stage 4 chronic kidney disease, or unspecified chronic kidney disease: Secondary | ICD-10-CM | POA: Diagnosis not present

## 2022-02-03 DIAGNOSIS — E1151 Type 2 diabetes mellitus with diabetic peripheral angiopathy without gangrene: Secondary | ICD-10-CM | POA: Diagnosis not present

## 2022-02-03 DIAGNOSIS — M109 Gout, unspecified: Secondary | ICD-10-CM | POA: Diagnosis not present

## 2022-02-03 DIAGNOSIS — N189 Chronic kidney disease, unspecified: Secondary | ICD-10-CM | POA: Diagnosis not present

## 2022-02-03 DIAGNOSIS — Z7952 Long term (current) use of systemic steroids: Secondary | ICD-10-CM | POA: Diagnosis not present

## 2022-02-03 DIAGNOSIS — Z941 Heart transplant status: Secondary | ICD-10-CM | POA: Diagnosis not present

## 2022-02-03 DIAGNOSIS — Z7984 Long term (current) use of oral hypoglycemic drugs: Secondary | ICD-10-CM | POA: Diagnosis not present

## 2022-02-03 DIAGNOSIS — Z7982 Long term (current) use of aspirin: Secondary | ICD-10-CM | POA: Diagnosis not present

## 2022-02-03 DIAGNOSIS — Z940 Kidney transplant status: Secondary | ICD-10-CM | POA: Diagnosis not present

## 2022-02-03 DIAGNOSIS — N179 Acute kidney failure, unspecified: Secondary | ICD-10-CM | POA: Diagnosis not present

## 2022-02-08 DIAGNOSIS — M109 Gout, unspecified: Secondary | ICD-10-CM | POA: Diagnosis not present

## 2022-02-08 DIAGNOSIS — Z86718 Personal history of other venous thrombosis and embolism: Secondary | ICD-10-CM | POA: Diagnosis not present

## 2022-02-08 DIAGNOSIS — E1122 Type 2 diabetes mellitus with diabetic chronic kidney disease: Secondary | ICD-10-CM | POA: Diagnosis not present

## 2022-02-08 DIAGNOSIS — Z941 Heart transplant status: Secondary | ICD-10-CM | POA: Diagnosis not present

## 2022-02-08 DIAGNOSIS — D84821 Immunodeficiency due to drugs: Secondary | ICD-10-CM | POA: Diagnosis not present

## 2022-02-08 DIAGNOSIS — Z7984 Long term (current) use of oral hypoglycemic drugs: Secondary | ICD-10-CM | POA: Diagnosis not present

## 2022-02-08 DIAGNOSIS — E785 Hyperlipidemia, unspecified: Secondary | ICD-10-CM | POA: Diagnosis not present

## 2022-02-08 DIAGNOSIS — E1151 Type 2 diabetes mellitus with diabetic peripheral angiopathy without gangrene: Secondary | ICD-10-CM | POA: Diagnosis not present

## 2022-02-08 DIAGNOSIS — Z7952 Long term (current) use of systemic steroids: Secondary | ICD-10-CM | POA: Diagnosis not present

## 2022-02-08 DIAGNOSIS — Z7985 Long-term (current) use of injectable non-insulin antidiabetic drugs: Secondary | ICD-10-CM | POA: Diagnosis not present

## 2022-02-08 DIAGNOSIS — N179 Acute kidney failure, unspecified: Secondary | ICD-10-CM | POA: Diagnosis not present

## 2022-02-08 DIAGNOSIS — I129 Hypertensive chronic kidney disease with stage 1 through stage 4 chronic kidney disease, or unspecified chronic kidney disease: Secondary | ICD-10-CM | POA: Diagnosis not present

## 2022-02-08 DIAGNOSIS — Z7982 Long term (current) use of aspirin: Secondary | ICD-10-CM | POA: Diagnosis not present

## 2022-02-08 DIAGNOSIS — Z940 Kidney transplant status: Secondary | ICD-10-CM | POA: Diagnosis not present

## 2022-02-08 DIAGNOSIS — Z794 Long term (current) use of insulin: Secondary | ICD-10-CM | POA: Diagnosis not present

## 2022-02-08 DIAGNOSIS — N189 Chronic kidney disease, unspecified: Secondary | ICD-10-CM | POA: Diagnosis not present

## 2022-02-14 DIAGNOSIS — Z48298 Encounter for aftercare following other organ transplant: Secondary | ICD-10-CM | POA: Diagnosis not present

## 2022-02-14 DIAGNOSIS — D849 Immunodeficiency, unspecified: Secondary | ICD-10-CM | POA: Diagnosis not present

## 2022-02-14 DIAGNOSIS — Z941 Heart transplant status: Secondary | ICD-10-CM | POA: Diagnosis not present

## 2022-02-14 DIAGNOSIS — Z940 Kidney transplant status: Secondary | ICD-10-CM | POA: Diagnosis not present

## 2022-02-17 DIAGNOSIS — D84821 Immunodeficiency due to drugs: Secondary | ICD-10-CM | POA: Diagnosis not present

## 2022-02-17 DIAGNOSIS — E785 Hyperlipidemia, unspecified: Secondary | ICD-10-CM | POA: Diagnosis not present

## 2022-02-17 DIAGNOSIS — Z86718 Personal history of other venous thrombosis and embolism: Secondary | ICD-10-CM | POA: Diagnosis not present

## 2022-02-17 DIAGNOSIS — Z7984 Long term (current) use of oral hypoglycemic drugs: Secondary | ICD-10-CM | POA: Diagnosis not present

## 2022-02-17 DIAGNOSIS — M109 Gout, unspecified: Secondary | ICD-10-CM | POA: Diagnosis not present

## 2022-02-17 DIAGNOSIS — Z794 Long term (current) use of insulin: Secondary | ICD-10-CM | POA: Diagnosis not present

## 2022-02-17 DIAGNOSIS — N189 Chronic kidney disease, unspecified: Secondary | ICD-10-CM | POA: Diagnosis not present

## 2022-02-17 DIAGNOSIS — Z7985 Long-term (current) use of injectable non-insulin antidiabetic drugs: Secondary | ICD-10-CM | POA: Diagnosis not present

## 2022-02-17 DIAGNOSIS — E1151 Type 2 diabetes mellitus with diabetic peripheral angiopathy without gangrene: Secondary | ICD-10-CM | POA: Diagnosis not present

## 2022-02-17 DIAGNOSIS — N179 Acute kidney failure, unspecified: Secondary | ICD-10-CM | POA: Diagnosis not present

## 2022-02-17 DIAGNOSIS — Z7982 Long term (current) use of aspirin: Secondary | ICD-10-CM | POA: Diagnosis not present

## 2022-02-17 DIAGNOSIS — Z940 Kidney transplant status: Secondary | ICD-10-CM | POA: Diagnosis not present

## 2022-02-17 DIAGNOSIS — Z7952 Long term (current) use of systemic steroids: Secondary | ICD-10-CM | POA: Diagnosis not present

## 2022-02-17 DIAGNOSIS — I129 Hypertensive chronic kidney disease with stage 1 through stage 4 chronic kidney disease, or unspecified chronic kidney disease: Secondary | ICD-10-CM | POA: Diagnosis not present

## 2022-02-17 DIAGNOSIS — Z941 Heart transplant status: Secondary | ICD-10-CM | POA: Diagnosis not present

## 2022-02-17 DIAGNOSIS — E1122 Type 2 diabetes mellitus with diabetic chronic kidney disease: Secondary | ICD-10-CM | POA: Diagnosis not present

## 2022-02-21 DIAGNOSIS — Z7985 Long-term (current) use of injectable non-insulin antidiabetic drugs: Secondary | ICD-10-CM | POA: Diagnosis not present

## 2022-02-21 DIAGNOSIS — E1151 Type 2 diabetes mellitus with diabetic peripheral angiopathy without gangrene: Secondary | ICD-10-CM | POA: Diagnosis not present

## 2022-02-21 DIAGNOSIS — Z941 Heart transplant status: Secondary | ICD-10-CM | POA: Diagnosis not present

## 2022-02-21 DIAGNOSIS — D84821 Immunodeficiency due to drugs: Secondary | ICD-10-CM | POA: Diagnosis not present

## 2022-02-21 DIAGNOSIS — Z7984 Long term (current) use of oral hypoglycemic drugs: Secondary | ICD-10-CM | POA: Diagnosis not present

## 2022-02-21 DIAGNOSIS — Z7982 Long term (current) use of aspirin: Secondary | ICD-10-CM | POA: Diagnosis not present

## 2022-02-21 DIAGNOSIS — E1122 Type 2 diabetes mellitus with diabetic chronic kidney disease: Secondary | ICD-10-CM | POA: Diagnosis not present

## 2022-02-21 DIAGNOSIS — E785 Hyperlipidemia, unspecified: Secondary | ICD-10-CM | POA: Diagnosis not present

## 2022-02-21 DIAGNOSIS — N189 Chronic kidney disease, unspecified: Secondary | ICD-10-CM | POA: Diagnosis not present

## 2022-02-21 DIAGNOSIS — I129 Hypertensive chronic kidney disease with stage 1 through stage 4 chronic kidney disease, or unspecified chronic kidney disease: Secondary | ICD-10-CM | POA: Diagnosis not present

## 2022-02-21 DIAGNOSIS — N179 Acute kidney failure, unspecified: Secondary | ICD-10-CM | POA: Diagnosis not present

## 2022-02-21 DIAGNOSIS — M109 Gout, unspecified: Secondary | ICD-10-CM | POA: Diagnosis not present

## 2022-02-21 DIAGNOSIS — Z86718 Personal history of other venous thrombosis and embolism: Secondary | ICD-10-CM | POA: Diagnosis not present

## 2022-02-21 DIAGNOSIS — Z7952 Long term (current) use of systemic steroids: Secondary | ICD-10-CM | POA: Diagnosis not present

## 2022-02-21 DIAGNOSIS — Z794 Long term (current) use of insulin: Secondary | ICD-10-CM | POA: Diagnosis not present

## 2022-02-21 DIAGNOSIS — Z940 Kidney transplant status: Secondary | ICD-10-CM | POA: Diagnosis not present

## 2022-02-22 DIAGNOSIS — Z79899 Other long term (current) drug therapy: Secondary | ICD-10-CM | POA: Diagnosis not present

## 2022-02-22 DIAGNOSIS — Z48298 Encounter for aftercare following other organ transplant: Secondary | ICD-10-CM | POA: Diagnosis not present

## 2022-02-22 DIAGNOSIS — D849 Immunodeficiency, unspecified: Secondary | ICD-10-CM | POA: Diagnosis not present

## 2022-02-22 DIAGNOSIS — Z941 Heart transplant status: Secondary | ICD-10-CM | POA: Diagnosis not present

## 2022-03-01 DIAGNOSIS — N179 Acute kidney failure, unspecified: Secondary | ICD-10-CM | POA: Diagnosis not present

## 2022-03-01 DIAGNOSIS — E1122 Type 2 diabetes mellitus with diabetic chronic kidney disease: Secondary | ICD-10-CM | POA: Diagnosis not present

## 2022-03-01 DIAGNOSIS — Z7985 Long-term (current) use of injectable non-insulin antidiabetic drugs: Secondary | ICD-10-CM | POA: Diagnosis not present

## 2022-03-01 DIAGNOSIS — E785 Hyperlipidemia, unspecified: Secondary | ICD-10-CM | POA: Diagnosis not present

## 2022-03-01 DIAGNOSIS — Z7982 Long term (current) use of aspirin: Secondary | ICD-10-CM | POA: Diagnosis not present

## 2022-03-01 DIAGNOSIS — M109 Gout, unspecified: Secondary | ICD-10-CM | POA: Diagnosis not present

## 2022-03-01 DIAGNOSIS — Z940 Kidney transplant status: Secondary | ICD-10-CM | POA: Diagnosis not present

## 2022-03-01 DIAGNOSIS — Z941 Heart transplant status: Secondary | ICD-10-CM | POA: Diagnosis not present

## 2022-03-01 DIAGNOSIS — E1151 Type 2 diabetes mellitus with diabetic peripheral angiopathy without gangrene: Secondary | ICD-10-CM | POA: Diagnosis not present

## 2022-03-01 DIAGNOSIS — I129 Hypertensive chronic kidney disease with stage 1 through stage 4 chronic kidney disease, or unspecified chronic kidney disease: Secondary | ICD-10-CM | POA: Diagnosis not present

## 2022-03-01 DIAGNOSIS — Z7952 Long term (current) use of systemic steroids: Secondary | ICD-10-CM | POA: Diagnosis not present

## 2022-03-01 DIAGNOSIS — Z7984 Long term (current) use of oral hypoglycemic drugs: Secondary | ICD-10-CM | POA: Diagnosis not present

## 2022-03-01 DIAGNOSIS — Z794 Long term (current) use of insulin: Secondary | ICD-10-CM | POA: Diagnosis not present

## 2022-03-01 DIAGNOSIS — N189 Chronic kidney disease, unspecified: Secondary | ICD-10-CM | POA: Diagnosis not present

## 2022-03-01 DIAGNOSIS — D84821 Immunodeficiency due to drugs: Secondary | ICD-10-CM | POA: Diagnosis not present

## 2022-03-01 DIAGNOSIS — Z86718 Personal history of other venous thrombosis and embolism: Secondary | ICD-10-CM | POA: Diagnosis not present

## 2022-03-02 DIAGNOSIS — Z794 Long term (current) use of insulin: Secondary | ICD-10-CM | POA: Diagnosis not present

## 2022-03-02 DIAGNOSIS — I10 Essential (primary) hypertension: Secondary | ICD-10-CM | POA: Diagnosis not present

## 2022-03-02 DIAGNOSIS — E1165 Type 2 diabetes mellitus with hyperglycemia: Secondary | ICD-10-CM | POA: Diagnosis not present

## 2022-03-08 DIAGNOSIS — M109 Gout, unspecified: Secondary | ICD-10-CM | POA: Diagnosis not present

## 2022-03-08 DIAGNOSIS — Z7952 Long term (current) use of systemic steroids: Secondary | ICD-10-CM | POA: Diagnosis not present

## 2022-03-08 DIAGNOSIS — Z940 Kidney transplant status: Secondary | ICD-10-CM | POA: Diagnosis not present

## 2022-03-08 DIAGNOSIS — N179 Acute kidney failure, unspecified: Secondary | ICD-10-CM | POA: Diagnosis not present

## 2022-03-08 DIAGNOSIS — Z7985 Long-term (current) use of injectable non-insulin antidiabetic drugs: Secondary | ICD-10-CM | POA: Diagnosis not present

## 2022-03-08 DIAGNOSIS — Z7982 Long term (current) use of aspirin: Secondary | ICD-10-CM | POA: Diagnosis not present

## 2022-03-08 DIAGNOSIS — D84821 Immunodeficiency due to drugs: Secondary | ICD-10-CM | POA: Diagnosis not present

## 2022-03-08 DIAGNOSIS — Z48298 Encounter for aftercare following other organ transplant: Secondary | ICD-10-CM | POA: Diagnosis not present

## 2022-03-08 DIAGNOSIS — E1122 Type 2 diabetes mellitus with diabetic chronic kidney disease: Secondary | ICD-10-CM | POA: Diagnosis not present

## 2022-03-08 DIAGNOSIS — I129 Hypertensive chronic kidney disease with stage 1 through stage 4 chronic kidney disease, or unspecified chronic kidney disease: Secondary | ICD-10-CM | POA: Diagnosis not present

## 2022-03-08 DIAGNOSIS — N189 Chronic kidney disease, unspecified: Secondary | ICD-10-CM | POA: Diagnosis not present

## 2022-03-08 DIAGNOSIS — E785 Hyperlipidemia, unspecified: Secondary | ICD-10-CM | POA: Diagnosis not present

## 2022-03-08 DIAGNOSIS — Z941 Heart transplant status: Secondary | ICD-10-CM | POA: Diagnosis not present

## 2022-03-08 DIAGNOSIS — Z79899 Other long term (current) drug therapy: Secondary | ICD-10-CM | POA: Diagnosis not present

## 2022-03-08 DIAGNOSIS — Z86718 Personal history of other venous thrombosis and embolism: Secondary | ICD-10-CM | POA: Diagnosis not present

## 2022-03-08 DIAGNOSIS — D849 Immunodeficiency, unspecified: Secondary | ICD-10-CM | POA: Diagnosis not present

## 2022-03-08 DIAGNOSIS — E1151 Type 2 diabetes mellitus with diabetic peripheral angiopathy without gangrene: Secondary | ICD-10-CM | POA: Diagnosis not present

## 2022-03-08 DIAGNOSIS — Z7984 Long term (current) use of oral hypoglycemic drugs: Secondary | ICD-10-CM | POA: Diagnosis not present

## 2022-03-08 DIAGNOSIS — Z794 Long term (current) use of insulin: Secondary | ICD-10-CM | POA: Diagnosis not present

## 2022-03-15 DIAGNOSIS — D84821 Immunodeficiency due to drugs: Secondary | ICD-10-CM | POA: Diagnosis not present

## 2022-03-15 DIAGNOSIS — Z940 Kidney transplant status: Secondary | ICD-10-CM | POA: Diagnosis not present

## 2022-03-15 DIAGNOSIS — Z86718 Personal history of other venous thrombosis and embolism: Secondary | ICD-10-CM | POA: Diagnosis not present

## 2022-03-15 DIAGNOSIS — Z794 Long term (current) use of insulin: Secondary | ICD-10-CM | POA: Diagnosis not present

## 2022-03-15 DIAGNOSIS — N189 Chronic kidney disease, unspecified: Secondary | ICD-10-CM | POA: Diagnosis not present

## 2022-03-15 DIAGNOSIS — I129 Hypertensive chronic kidney disease with stage 1 through stage 4 chronic kidney disease, or unspecified chronic kidney disease: Secondary | ICD-10-CM | POA: Diagnosis not present

## 2022-03-15 DIAGNOSIS — E1122 Type 2 diabetes mellitus with diabetic chronic kidney disease: Secondary | ICD-10-CM | POA: Diagnosis not present

## 2022-03-15 DIAGNOSIS — E1151 Type 2 diabetes mellitus with diabetic peripheral angiopathy without gangrene: Secondary | ICD-10-CM | POA: Diagnosis not present

## 2022-03-15 DIAGNOSIS — E785 Hyperlipidemia, unspecified: Secondary | ICD-10-CM | POA: Diagnosis not present

## 2022-03-15 DIAGNOSIS — Z941 Heart transplant status: Secondary | ICD-10-CM | POA: Diagnosis not present

## 2022-03-15 DIAGNOSIS — Z7982 Long term (current) use of aspirin: Secondary | ICD-10-CM | POA: Diagnosis not present

## 2022-03-15 DIAGNOSIS — Z7984 Long term (current) use of oral hypoglycemic drugs: Secondary | ICD-10-CM | POA: Diagnosis not present

## 2022-03-15 DIAGNOSIS — N179 Acute kidney failure, unspecified: Secondary | ICD-10-CM | POA: Diagnosis not present

## 2022-03-15 DIAGNOSIS — Z7952 Long term (current) use of systemic steroids: Secondary | ICD-10-CM | POA: Diagnosis not present

## 2022-03-15 DIAGNOSIS — Z7985 Long-term (current) use of injectable non-insulin antidiabetic drugs: Secondary | ICD-10-CM | POA: Diagnosis not present

## 2022-03-15 DIAGNOSIS — M109 Gout, unspecified: Secondary | ICD-10-CM | POA: Diagnosis not present

## 2022-03-16 DIAGNOSIS — D849 Immunodeficiency, unspecified: Secondary | ICD-10-CM | POA: Diagnosis not present

## 2022-03-16 DIAGNOSIS — Z941 Heart transplant status: Secondary | ICD-10-CM | POA: Diagnosis not present

## 2022-03-16 DIAGNOSIS — Z48298 Encounter for aftercare following other organ transplant: Secondary | ICD-10-CM | POA: Diagnosis not present

## 2022-04-12 ENCOUNTER — Ambulatory Visit: Payer: Medicare Other | Admitting: Family Medicine

## 2022-04-21 ENCOUNTER — Ambulatory Visit: Payer: Medicare PPO | Attending: Family Medicine

## 2022-06-17 DEATH — deceased

## 2022-12-24 IMAGING — DX DG HIP (WITH OR WITHOUT PELVIS) 2-3V*L*
3 series · 3 of 3 positions shown · non-contrast
Comparison: None.

CLINICAL DATA: Left hip pain.  No known trauma.

EXAM:
DG HIP (WITH OR WITHOUT PELVIS) 2-3V LEFT

[pelvis ap]
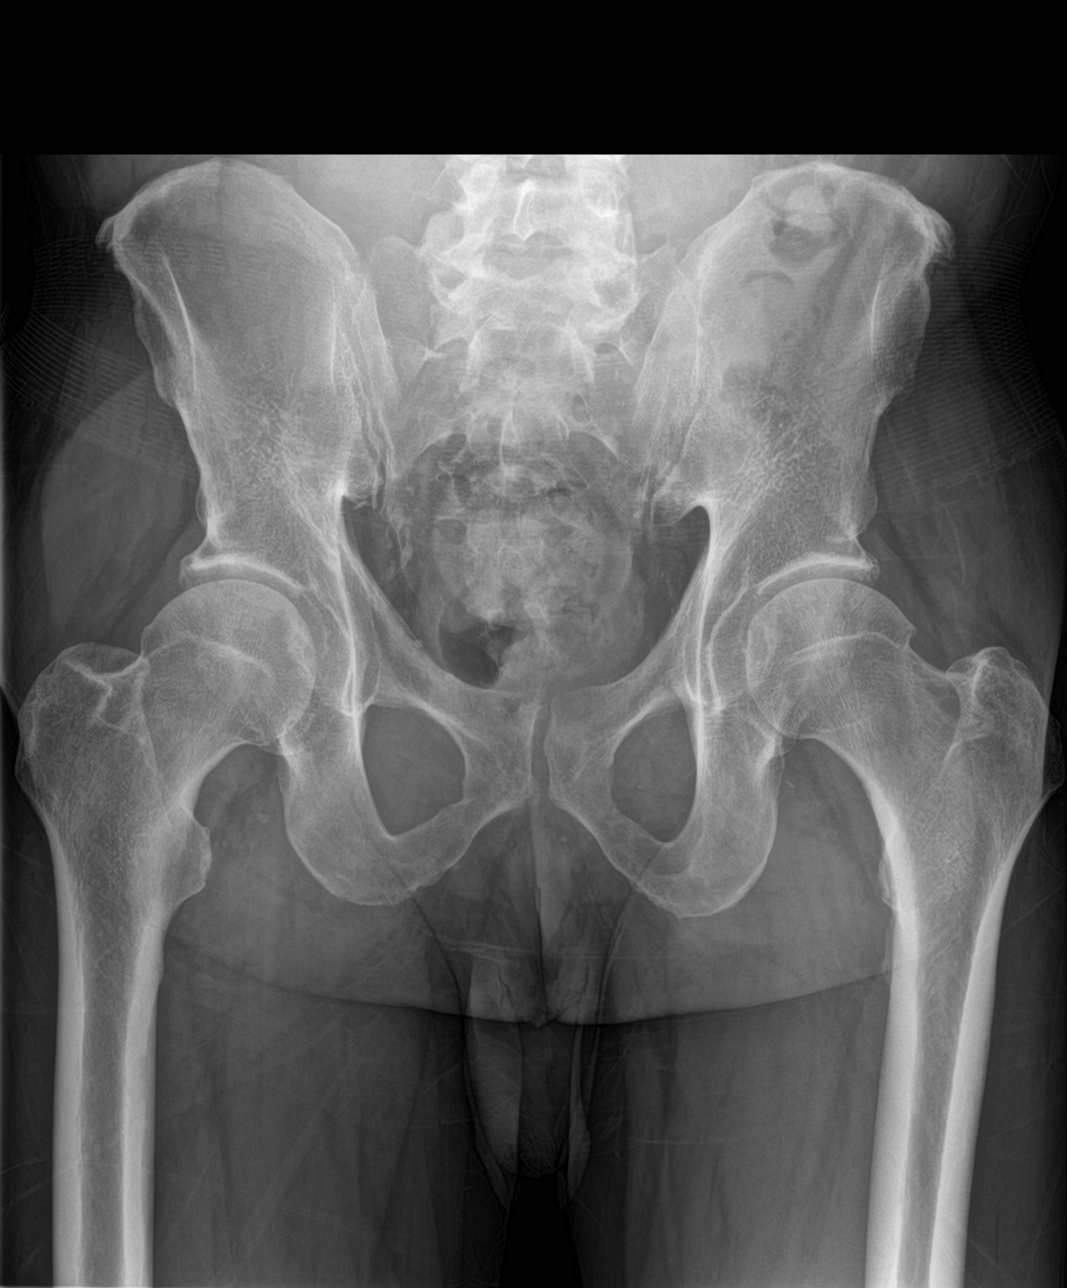

[hip ap]
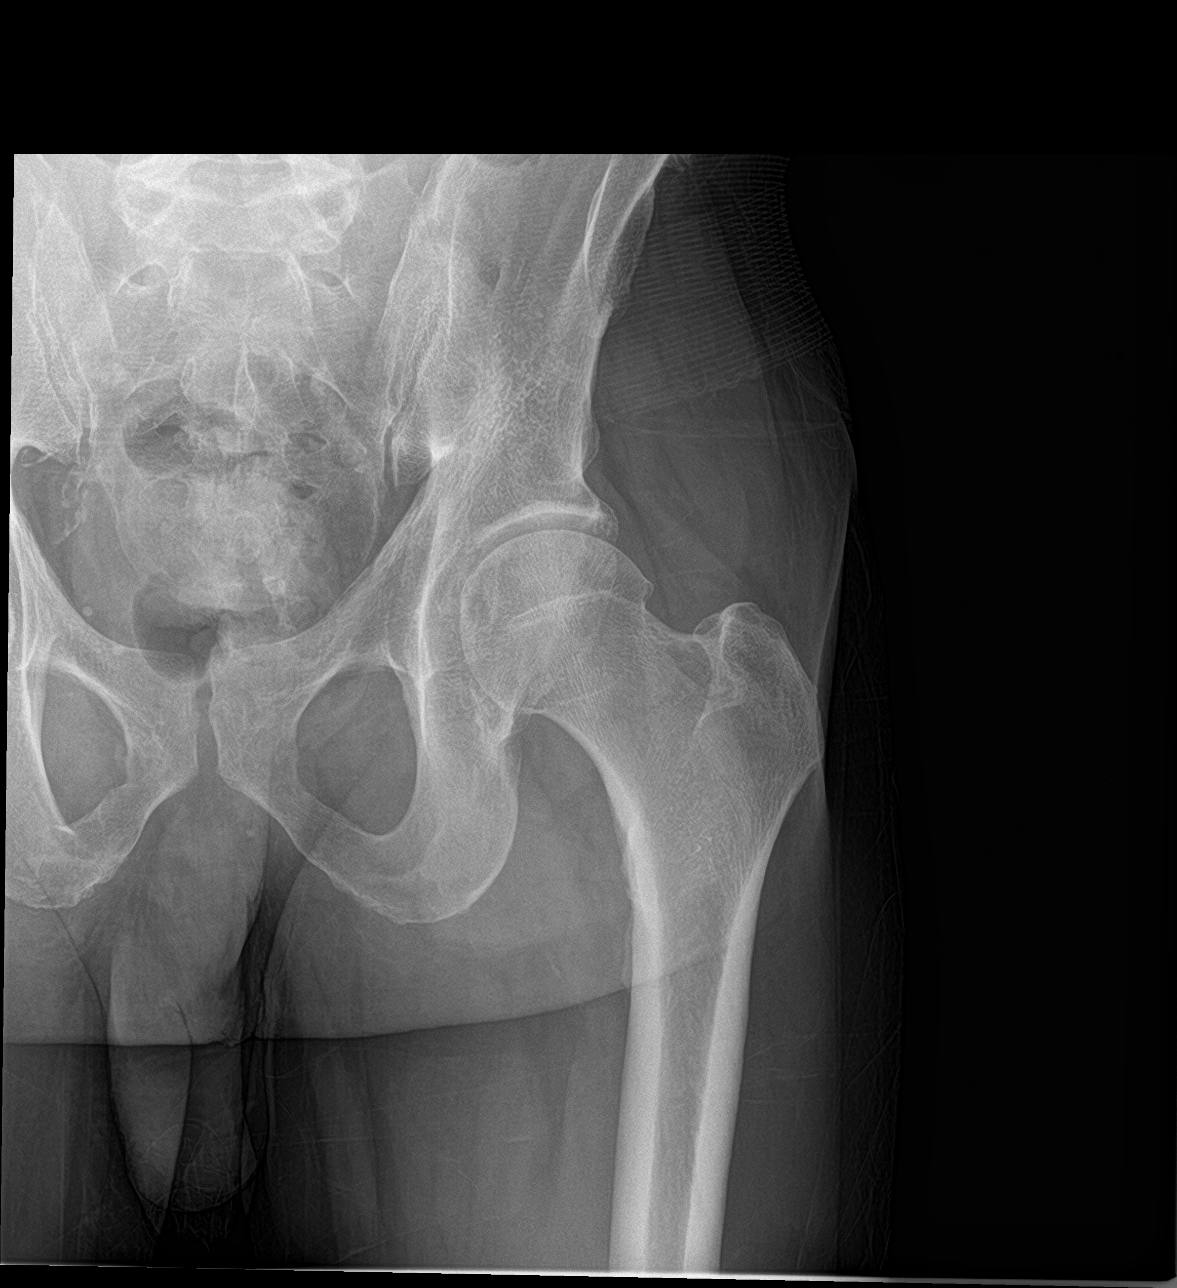

[hip lat]
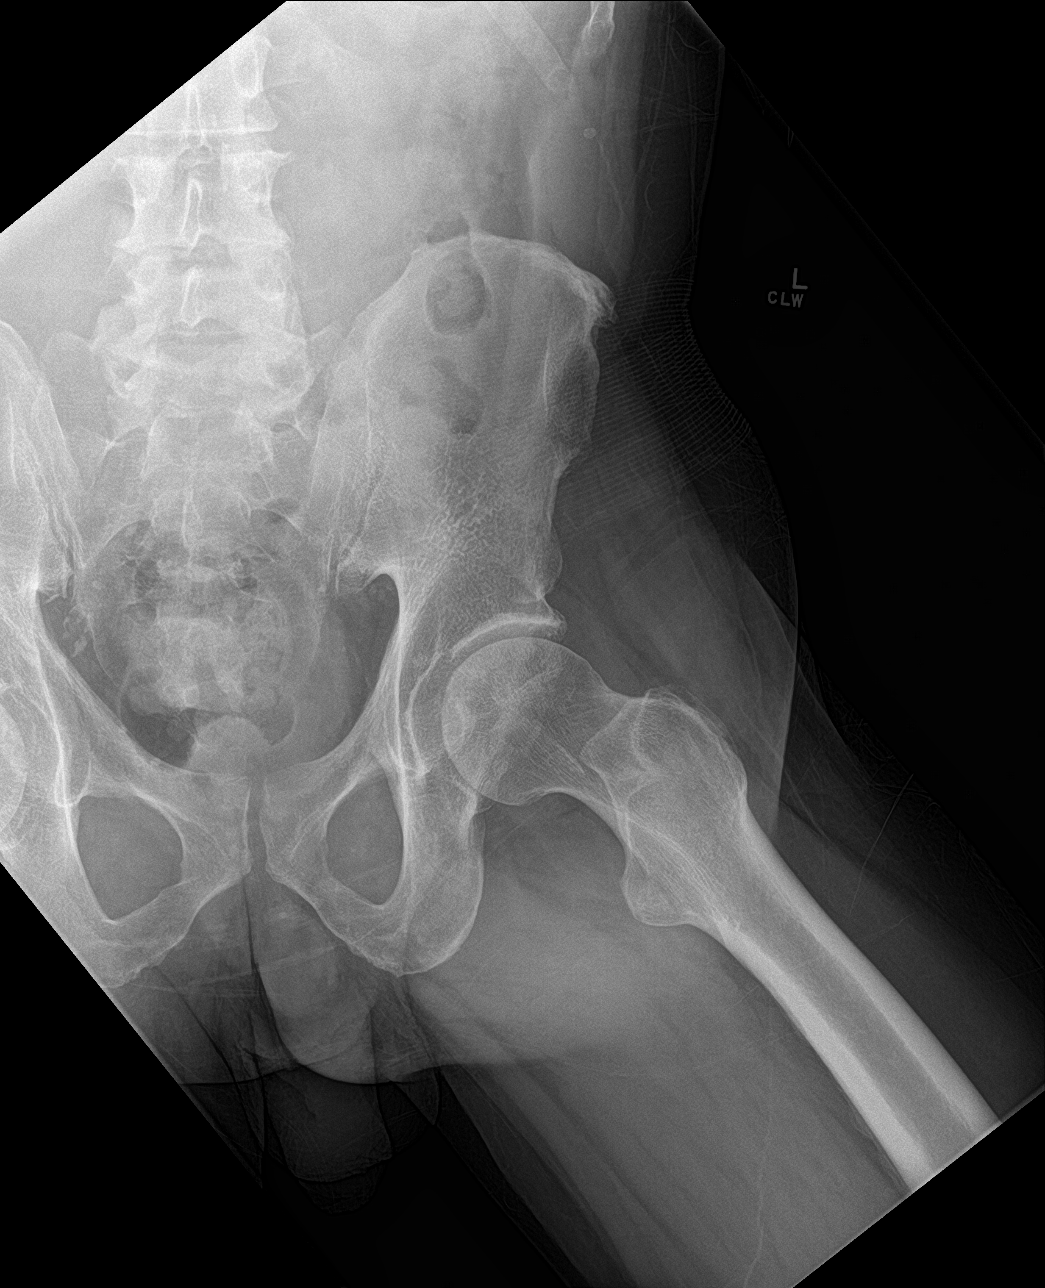

[3 of 3 positions shown; findings below may reference images not displayed]

FINDINGS: There is no evidence of hip fracture or dislocation. There is no
evidence of arthropathy or other focal bone abnormality.
IMPRESSION: Negative.
